# Patient Record
Sex: Male | Born: 1955
Health system: Southern US, Community
[De-identification: ages and names within clinical notes are randomized; demographics above are authoritative.]

## PROBLEM LIST (undated history)

## (undated) DIAGNOSIS — F209 Schizophrenia, unspecified: Secondary | ICD-10-CM

## (undated) HISTORY — PX: COLON SURGERY: SHX602

## (undated) MED FILL — Ferumoxytol Inj 510 MG/17ML (30 MG/ML) (Elemental Fe): INTRAVENOUS | Qty: 17 | Status: AC

---

## 2005-06-14 ENCOUNTER — Emergency Department: Payer: Self-pay | Admitting: Emergency Medicine

## 2008-09-17 ENCOUNTER — Emergency Department: Payer: Self-pay | Admitting: Emergency Medicine

## 2009-09-24 ENCOUNTER — Emergency Department: Payer: Self-pay | Admitting: Internal Medicine

## 2009-10-15 ENCOUNTER — Ambulatory Visit: Payer: Self-pay | Admitting: Family Medicine

## 2011-02-14 ENCOUNTER — Emergency Department: Payer: Self-pay | Admitting: Emergency Medicine

## 2012-06-06 ENCOUNTER — Emergency Department: Payer: Self-pay | Admitting: Emergency Medicine

## 2013-05-14 ENCOUNTER — Emergency Department: Payer: Self-pay | Admitting: Emergency Medicine

## 2013-05-14 LAB — COMPREHENSIVE METABOLIC PANEL
Albumin: 3.7 g/dL (ref 3.4–5.0)
Alkaline Phosphatase: 102 U/L
BUN: 10 mg/dL (ref 7–18)
Bilirubin,Total: 0.4 mg/dL (ref 0.2–1.0)
Calcium, Total: 9.3 mg/dL (ref 8.5–10.1)
Chloride: 111 mmol/L — ABNORMAL HIGH (ref 98–107)
EGFR (African American): >60
Osmolality: 276 (ref 275–301)
SGOT(AST): 16 U/L (ref 15–37)
SGPT (ALT): 18 U/L (ref 12–78)

## 2013-05-14 LAB — CBC WITH DIFFERENTIAL/PLATELET
Basophil %: 0.3 %
Eosinophil #: 0.1 10*3/uL (ref 0.0–0.7)
HCT: 48.2 % (ref 40.0–52.0)
HGB: 16 g/dL (ref 13.0–18.0)
Lymphocyte #: 2 10*3/uL (ref 1.0–3.6)
Lymphocyte %: 21.6 %
MCV: 83 fL (ref 80–100)
Monocyte %: 9.1 %
Neutrophil %: 68.4 %
RBC: 5.78 10*6/uL (ref 4.40–5.90)

## 2013-05-14 LAB — URINALYSIS, COMPLETE
Bacteria: NONE SEEN
Bilirubin,UR: NEGATIVE
Ketone: NEGATIVE
Leukocyte Esterase: NEGATIVE
Nitrite: NEGATIVE
Ph: 5 (ref 4.5–8.0)
RBC,UR: 543 /HPF (ref 0–5)
WBC UR: 2 /HPF (ref 0–5)

## 2014-03-18 ENCOUNTER — Ambulatory Visit: Payer: Self-pay | Admitting: Advanced Practice Midwife

## 2015-03-15 LAB — HM HIV SCREENING LAB: HM HIV Screening: NEGATIVE

## 2016-11-28 ENCOUNTER — Emergency Department
Admission: EM | Admit: 2016-11-28 | Discharge: 2016-11-30 | Disposition: A | Discharge status: Other Acute Inpatient Hospital | Payer: Medicare Other | Attending: Emergency Medicine | Admitting: Emergency Medicine

## 2016-11-28 DIAGNOSIS — F29 Unspecified psychosis not due to a substance or known physiological condition: Secondary | ICD-10-CM | POA: Insufficient documentation

## 2016-11-28 DIAGNOSIS — F17200 Nicotine dependence, unspecified, uncomplicated: Secondary | ICD-10-CM | POA: Diagnosis present

## 2016-11-28 DIAGNOSIS — R440 Auditory hallucinations: Secondary | ICD-10-CM | POA: Diagnosis present

## 2016-11-28 LAB — CBC
HEMATOCRIT: 46.5 % (ref 40.0–52.0)
Hemoglobin: 15.4 g/dL (ref 13.0–18.0)
MCH: 28 pg (ref 26.0–34.0)
MCHC: 33.2 g/dL (ref 32.0–36.0)
MCV: 84.3 fL (ref 80.0–100.0)
Platelets: 154 10*3/uL (ref 150–440)
RBC: 5.51 MIL/uL (ref 4.40–5.90)
RDW: 13.7 % (ref 11.5–14.5)
WBC: 10.1 10*3/uL (ref 3.8–10.6)

## 2016-11-28 LAB — COMPREHENSIVE METABOLIC PANEL
ALBUMIN: 3.9 g/dL (ref 3.5–5.0)
ALK PHOS: 72 U/L (ref 38–126)
ALT: 14 U/L — ABNORMAL LOW (ref 17–63)
AST: 26 U/L (ref 15–41)
Anion gap: 6 (ref 5–15)
BILIRUBIN TOTAL: 0.5 mg/dL (ref 0.3–1.2)
BUN: 12 mg/dL (ref 6–20)
CALCIUM: 9.4 mg/dL (ref 8.9–10.3)
CO2: 23 mmol/L (ref 22–32)
CREATININE: 1.4 mg/dL — AB (ref 0.61–1.24)
Chloride: 109 mmol/L (ref 101–111)
GFR calc Af Amer: >60 mL/min (ref >60)
GFR calc non Af Amer: 53 mL/min — ABNORMAL LOW (ref >60)
GLUCOSE: 153 mg/dL — AB (ref 65–99)
Potassium: 3.4 mmol/L — ABNORMAL LOW (ref 3.5–5.1)
Sodium: 138 mmol/L (ref 135–145)
TOTAL PROTEIN: 7 g/dL (ref 6.5–8.1)

## 2016-11-28 LAB — URINE DRUG SCREEN, QUALITATIVE (ARMC ONLY)
AMPHETAMINES, UR SCREEN: NOT DETECTED
BENZODIAZEPINE, UR SCRN: NOT DETECTED
Barbiturates, Ur Screen: NOT DETECTED
Cannabinoid 50 Ng, Ur ~~LOC~~: NOT DETECTED
Cocaine Metabolite,Ur ~~LOC~~: NOT DETECTED
MDMA (Ecstasy)Ur Screen: NOT DETECTED
Methadone Scn, Ur: NOT DETECTED
OPIATE, UR SCREEN: NOT DETECTED
PHENCYCLIDINE (PCP) UR S: NOT DETECTED
Tricyclic, Ur Screen: NOT DETECTED

## 2016-11-28 LAB — ACETAMINOPHEN LEVEL: Acetaminophen (Tylenol), Serum: <10 ug/mL — ABNORMAL LOW (ref 10–30)

## 2016-11-28 LAB — ETHANOL: Alcohol, Ethyl (B): <5 mg/dL (ref <5)

## 2016-11-28 LAB — SALICYLATE LEVEL: Salicylate Lvl: <7 mg/dL (ref 2.8–30.0)

## 2016-11-28 MED ORDER — OLANZAPINE 5 MG PO TABS
5.0000 mg | ORAL_TABLET | Freq: Every day | ORAL | Status: DC
  Filled 2016-11-28: qty 1

## 2016-11-28 MED ORDER — LORAZEPAM 2 MG PO TABS: 2.0000 mg | ORAL_TABLET | Freq: Four times a day (QID) | ORAL | Status: DC | PRN

## 2016-11-28 MED ORDER — LORAZEPAM 2 MG/ML IJ SOLN: 2.0000 mg | Freq: Four times a day (QID) | INTRAMUSCULAR | Status: DC | PRN

## 2016-11-28 NOTE — ED Provider Notes (Addendum)
[facility] Emergency Department Provider Note  Time seen: 8:39 PM  I have reviewed the triage vital signs Dustin the nursing notes.   HISTORY  Chief Complaint Behavior Problem    HPI Dustin [name] is a 61 y.o. male with a past medical history of schizophrenia who presents to the emergency department under an involuntary commitment taken out by family stating the patient has been not taking his [name], Dustin [name], Dustin [name] agitated Dustin yelling at neighbors. Here the patient denies any complaints but he is very difficult to comprehend, has rambling speech with a tangential thought process. Jumps from topic to topic but none of which seems to make sense. he denies any SI or HI. Admits to not taking his [name] but states they refused to give them to him.  No past medical history on file.  There are no active problems to display for this patient.   No past surgical history on file.  Prior to Admission [name]   Not on File    No Known Allergies  No family history on file.  Social History Social History  Substance Use Topics  . Smoking status: Not on file  . Smokeless tobacco: Not on file  . Alcohol use Not on file    Review of Systems Constitutional: Negative for fever. Cardiovascular: Negative for chest pain. Respiratory: Negative for shortness of breath. Gastrointestinal: Negative for abdominal pain Neurological: Negative for headache All other ROS negative  ____________________________________________   PHYSICAL EXAM:  VITAL SIGNS: ED Triage Vitals [11/28/16 1919]  Enc Vitals Group     BP (!) 157/102     Pulse Rate (!) 107     Resp 20     Temp 98.7 F (37.1 C)     Temp Source Oral     SpO2 99 %     Weight 165 lb (74.8 kg)     Height 6' (1.829 m)     Head Circumference      Peak Flow      Pain Score      Pain Loc      Pain Edu?      Excl. in [place]?     Constitutional:Alert, no distress.  Patient is very difficult to follow, speech does not follow a normal process, difficult for him to convey that he is attempting to convey. Eyes: Normal exam ENT   Head: Normocephalic Dustin atraumatic.   Mouth/Throat: Mucous membranes are moist. Cardiovascular: Normal rate, regular rhythm. No murmur Respiratory: Normal respiratory effort without tachypnea nor retractions. Breath sounds are clear  Gastrointestinal: Soft Dustin nontender. No distention.  Musculoskeletal: Nontender with normal range of motion in all extremities.  Neurologic:  Normal speech Dustin language. No gross focal neurologic deficits  Skin:  Skin is warm, dry Dustin intact.  Psychiatric: Mood Dustin affect are normal.   ____________________________________________   INITIAL IMPRESSION / ASSESSMENT Dustin PLAN / ED COURSE  Pertinent labs & imaging results that were available during my care of the patient were reviewed by me Dustin considered in my medical decision making (see chart for details).  Patient presents the emergency department for IVC not taking [name], reportedly hearing voices although the patient denies this, him being agitated yelling at neighbors. Patient does admit to not taking his [name]. In the emergency department the patient is very difficult to follow, jumps from thought to thought Dustin does not make much sense in his explanations. Is not able to fluently convey a sentence.  We will maintain the IVC into the patient can be evaluated by psychiatry. Patient is medical workup is thus far largely nonrevealing.  Patient's lab work is largely at baseline. Patient has been seen by psychiatry they recommend inpatient admission for the patient. We will start the patient on Zyprexa Dustin will order [name] as recommended by psychiatry.  ____________________________________________   FINAL CLINICAL IMPRESSION(S) / ED DIAGNOSES  Psychosis    Harvest Dark, MD 11/28/16 2042    Harvest Dark,  MD 11/28/16 2253

## 2016-11-28 NOTE — ED Notes (Signed)
Patient assigned to appropriate care area. Patient oriented to unit/care area: Informed that, for their safety, care areas are designed for safety and monitored by security cameras at all times; and visiting hours explained to patient. Patient verbalizes understanding, and verbal contract for safety obtained. 

## 2016-11-28 NOTE — BH Assessment (Signed)
Assessment Note  Dustin [name] is an 61 y.o. male presenting to the ED under IVC from [place].  According to the IVC, patient has not been taking his medications and responding to auditory hallucinations.  Pt was seen today at [facility] for his dose of Invega.  Staff from [place] (Dustin Font, RN) reports patient had been receiving Invega through [facility] but was not able to get his injections due to [place] not having anyone to administer.  Ms Dustin [name] states that patient came to [place] several times to get his injections.  She reports that pt will not be able to continue receiving his injection through [facility].  Ms. Dustin [name] also reports pt is hypereligious and has been making sexually inappropriate statements to male staff.  Pt denies these allegations.  Pt denies SI/HI.  He denies current drug/alcohol use.  Diagnosis:Schizophrenia  Past Medical History: No past medical history on file.  No past surgical history on file.  Family History: No family history on file.  Social History:  has no tobacco, alcohol, and drug history on file.  Additional Social History:  Alcohol / Drug Use Pain Medications: See PTA Prescriptions: See PTA Over the Counter: See PTA History of alcohol / drug use?: No history of alcohol / drug abuse  CIWA: CIWA-Ar BP: (!) 142/98 Pulse Rate: (!) 101 COWS:    Allergies: No Known Allergies  Home Medications:  (Not in a hospital admission)  OB/GYN Status:  No LMP for male patient.  General Assessment Data Location of Assessment: [facility] ED TTS Assessment: In system Is this a Tele or Face-to-Face Assessment?: Face-to-Face Is this an Initial Assessment or a Re-assessment for this encounter?: Initial Assessment Marital status: Single Maiden name: n/a Is patient pregnant?: No Pregnancy Status: No Living Arrangements: Other relatives Can pt return to current living arrangement?: Yes Admission Status: Involuntary Is patient capable of signing voluntary  admission?: No Referral Source: Psychiatrist Insurance type: None     Crisis Care Plan Living Arrangements: Other relatives Legal Guardian: Other: (self) Name of Psychiatrist: [place] Name of Therapist: [facility]  Education Status Is patient currently in school?: No Current Grade: n/a Highest grade of school patient has completed: n/a Name of school: n/a Contact person: n/a  Risk to self with the past 6 months Suicidal Ideation: No Has patient been a risk to self within the past 6 months prior to admission? : No Suicidal Intent: No Has patient had any suicidal intent within the past 6 months prior to admission? : No Is patient at risk for suicide?: No Suicidal Plan?: No Has patient had any suicidal plan within the past 6 months prior to admission? : No Access to Means: No What has been your use of drugs/alcohol within the last 12 months?: Pt denies drug/alcohol use Previous Attempts/Gestures: No How many times?: 0 Other Self Harm Risks: None identified Triggers for Past Attempts: None known Intentional Self Injurious Behavior: None Family Suicide History: Unknown Recent stressful life event(s): Other (Comment) Persecutory voices/beliefs?: No Depression: No Substance abuse history and/or treatment for substance abuse?: No Suicide prevention information given to non-admitted patients: Not applicable  Risk to Others within the past 6 months Homicidal Ideation: No Does patient have any lifetime risk of violence toward others beyond the six months prior to admission? : No Thoughts of Harm to Others: No Current Homicidal Intent: No Current Homicidal Plan: No Access to Homicidal Means: No Identified Victim: none identified History of harm to others?: No Assessment of Violence: None Noted Violent  Behavior Description: None identified Does patient have access to weapons?: No Criminal Charges Pending?: No Does patient have a court date: No Is patient on probation?:  No  Psychosis Hallucinations: None noted Delusions: None noted  Mental Status Report Appearance/Hygiene: In scrubs Eye Contact: Fair Motor Activity: Freedom of movement Speech: Incoherent Level of Consciousness: Alert Mood: Pleasant Affect: Inconsistent with thought content Anxiety Level: Minimal Thought Processes: Flight of Ideas Judgement: Partial Orientation: Person, Place, Time Obsessive Compulsive Thoughts/Behaviors: None  Cognitive Functioning Concentration: Good Memory: Remote Intact IQ: Average Insight: Fair Impulse Control: Fair Appetite: Good Sleep: No Change Vegetative Symptoms: None  ADLScreening [facility] Assessment Services) Patient's cognitive ability adequate to safely complete daily activities?: Yes Patient able to express need for assistance with ADLs?: Yes Independently performs ADLs?: Yes (appropriate for developmental age)  Prior Inpatient Therapy Prior Inpatient Therapy: No Prior Therapy Dates: na Prior Therapy Facilty/Provider(s): na Reason for Treatment: na  Prior Outpatient Therapy Prior Outpatient Therapy: Yes Prior Therapy Dates: current Prior Therapy Facilty/Provider(s): Dustin [name] Reason for Treatment: schizophrenia Does patient have an ACCT team?: No Does patient have Intensive In-House Services?  : No Does patient have [facility] services? : No Does patient have P4CC services?: No  ADL Screening (condition at time of admission) Patient's cognitive ability adequate to safely complete daily activities?: Yes Patient able to express need for assistance with ADLs?: Yes Independently performs ADLs?: Yes (appropriate for developmental age)       Abuse/Neglect Assessment (Assessment to be complete while patient is alone) Physical Abuse: Denies Verbal Abuse: Denies Sexual Abuse: Denies Exploitation of patient/patient's resources: Denies Self-Neglect: Denies Values / Beliefs Cultural Requests During Hospitalization: None Spiritual Requests  During Hospitalization: None Consults Spiritual Care Consult Needed: No Social Work Consult Needed: No Regulatory affairs officer (For Healthcare) Does Patient Have a Medical Advance Directive?: No    Additional Information 1:1 In Past 12 Months?: No CIRT Risk: No Elopement Risk: No Does patient have medical clearance?: Yes     Disposition:  Disposition Initial Assessment Completed for this Encounter: Yes Disposition of Patient: Inpatient treatment program Type of inpatient treatment program: Adult  On Site Evaluation by:   Reviewed with Physician:    Oneita Hurt 11/28/2016 11:17 PM

## 2016-11-28 NOTE — ED Notes (Signed)
SOC MD called to get report on the Pt.

## 2016-11-28 NOTE — ED Triage Notes (Addendum)
Pt to triage via wheelchair.  Pt is IVC.   Pt brought in by [employer] .  Pt has not been taking his meds.   Pt uncooperative in triage. Pt refusing to have blood work done.

## 2016-11-29 ENCOUNTER — Encounter: Payer: Self-pay | Admitting: Psychiatry

## 2016-11-29 DIAGNOSIS — F17200 Nicotine dependence, unspecified, uncomplicated: Secondary | ICD-10-CM | POA: Diagnosis present

## 2016-11-29 MED ORDER — NICOTINE 21 MG/24HR TD PT24: 21.0000 mg | MEDICATED_PATCH | Freq: Every day | TRANSDERMAL | Status: DC

## 2016-11-29 NOTE — ED Notes (Signed)
Pt ambulated to the bathroom and returned to his room without difficulty.

## 2016-11-29 NOTE — ED Notes (Signed)
Patient standing in doorway talking with staff

## 2016-11-29 NOTE — ED Notes (Signed)
Pt received lunch tray 

## 2016-11-29 NOTE — ED Notes (Signed)
Pt received dinner tray.

## 2016-11-29 NOTE — ED Notes (Signed)
Patient resting quietly in room. No noted distress or abnormal behaviors noted. Will continue 15 minute checks and observation by security camera for safety. 

## 2016-11-29 NOTE — ED Notes (Signed)
Report given to [place], Therapist, sports.

## 2016-11-29 NOTE — BH Assessment (Signed)
Pt placement referral submitted to [facility].  

## 2016-11-29 NOTE — BH Assessment (Addendum)
Patient has been accepted for placement with [facility] by Dr. Bary Leriche. Bed Assignment:  312 Attending MD:  Dr. Jerilee Hoh

## 2016-11-29 NOTE — ED Notes (Signed)
Pt taking a shower 

## 2016-11-29 NOTE — ED Notes (Signed)
Pt became agitated and aggressive when RN and tech tried to get his vitals.

## 2016-11-29 NOTE — ED Notes (Signed)
Pt ambulatory out into hall, this RN asks if he needs anything, pt does not speak to me, points to ED tech, Solmon Ice to speak with her.  ED tech addresses pt needs at this time.

## 2016-11-29 NOTE — ED Provider Notes (Signed)
-----------------------------------------   6:05 AM on 11/29/2016 -----------------------------------------   Blood pressure (!) 142/98, pulse (!) 101, temperature 98.7 F (37.1 C), temperature source Oral, resp. rate 18, height 6' (1.829 m), weight 74.8 kg (165 lb), SpO2 99 %.  The patient had no acute events since last update.  Calm and cooperative at this time.  Disposition is pending Psychiatry/Behavioral Medicine team recommendations.     Paulette Blanch, MD 11/29/16 [phone]

## 2016-11-29 NOTE — BH Assessment (Signed)
Pt chart under review by Dr.Pucilowska for possible [facility] BMU admission. 

## 2016-11-29 NOTE — ED Notes (Signed)
Pt laying in bed upon approach. RN introduced herself and patient replied, "Nice to meet you Amy."  Pt then asked for more water.  Pt was difficult to understand, but was smiling during interaction. Maintained on 15 minute checks and observation by security camera for safety.

## 2016-11-30 ENCOUNTER — Inpatient Hospital Stay
Admission: AD | Admit: 2016-11-30 | Discharge: 2016-12-13 | DRG: 885 | Disposition: A | Discharge status: Home / Self Care | Payer: Medicare Other | Attending: Psychiatry | Admitting: Psychiatry

## 2016-11-30 DIAGNOSIS — Z9114 Patient's other noncompliance with medication regimen: Secondary | ICD-10-CM | POA: Diagnosis not present

## 2016-11-30 DIAGNOSIS — I959 Hypotension, unspecified: Secondary | ICD-10-CM | POA: Diagnosis not present

## 2016-11-30 DIAGNOSIS — F17210 Nicotine dependence, cigarettes, uncomplicated: Secondary | ICD-10-CM | POA: Diagnosis present

## 2016-11-30 DIAGNOSIS — R000 Tachycardia, unspecified: Secondary | ICD-10-CM | POA: Diagnosis not present

## 2016-11-30 DIAGNOSIS — F203 Undifferentiated schizophrenia: Secondary | ICD-10-CM | POA: Diagnosis present

## 2016-11-30 DIAGNOSIS — G4700 Insomnia, unspecified: Secondary | ICD-10-CM | POA: Diagnosis present

## 2016-11-30 DIAGNOSIS — F419 Anxiety disorder, unspecified: Secondary | ICD-10-CM | POA: Diagnosis present

## 2016-11-30 DIAGNOSIS — K5900 Constipation, unspecified: Secondary | ICD-10-CM | POA: Diagnosis not present

## 2016-11-30 DIAGNOSIS — F17200 Nicotine dependence, unspecified, uncomplicated: Secondary | ICD-10-CM | POA: Diagnosis present

## 2016-11-30 DIAGNOSIS — I951 Orthostatic hypotension: Secondary | ICD-10-CM | POA: Diagnosis not present

## 2016-11-30 DIAGNOSIS — F329 Major depressive disorder, single episode, unspecified: Secondary | ICD-10-CM | POA: Diagnosis present

## 2016-11-30 DIAGNOSIS — F22 Delusional disorders: Secondary | ICD-10-CM | POA: Diagnosis present

## 2016-11-30 DIAGNOSIS — F39 Unspecified mood [affective] disorder: Secondary | ICD-10-CM | POA: Diagnosis not present

## 2016-11-30 MED ORDER — OLANZAPINE 5 MG PO TBDP: 15.0000 mg | ORAL_TABLET | Freq: Every day | ORAL | Status: DC

## 2016-11-30 MED ORDER — ALUM & MAG HYDROXIDE-SIMETH 200-200-20 MG/5ML PO SUSP: 30.0000 mL | ORAL | Status: DC | PRN

## 2016-11-30 MED ORDER — NICOTINE 21 MG/24HR TD PT24
21.0000 mg | MEDICATED_PATCH | Freq: Every day | TRANSDERMAL | Status: DC
  Filled 2016-11-30 (×4): qty 1

## 2016-11-30 MED ORDER — LORAZEPAM 2 MG/ML IJ SOLN: 2.0000 mg | INTRAMUSCULAR | Status: DC | PRN

## 2016-11-30 MED ORDER — LORAZEPAM 2 MG PO TABS: 2.0000 mg | ORAL_TABLET | Freq: Four times a day (QID) | ORAL | Status: DC | PRN

## 2016-11-30 MED ORDER — TEMAZEPAM 15 MG PO CAPS: 30.0000 mg | ORAL_CAPSULE | Freq: Every evening | ORAL | Status: DC | PRN

## 2016-11-30 MED ORDER — ACETAMINOPHEN 325 MG PO TABS
650.0000 mg | ORAL_TABLET | Freq: Four times a day (QID) | ORAL | Status: DC | PRN
  Administered 2016-12-03: 650 mg via ORAL
  Filled 2016-11-30: qty 2

## 2016-11-30 MED ORDER — OLANZAPINE 5 MG PO TBDP
15.0000 mg | ORAL_TABLET | Freq: Two times a day (BID) | ORAL | Status: DC
  Filled 2016-11-30: qty 3

## 2016-11-30 MED ORDER — TRAZODONE HCL 100 MG PO TABS: 100.0000 mg | ORAL_TABLET | Freq: Every day | ORAL | Status: DC

## 2016-11-30 MED ORDER — MAGNESIUM HYDROXIDE 400 MG/5ML PO SUSP
30.0000 mL | Freq: Every day | ORAL | Status: DC | PRN
  Administered 2016-12-06: 30 mL via ORAL
  Filled 2016-11-30: qty 30

## 2016-11-30 MED ORDER — LORAZEPAM 2 MG PO TABS
2.0000 mg | ORAL_TABLET | Freq: Every day | ORAL | Status: DC
  Administered 2016-12-01 – 2016-12-03 (×3): 2 mg via ORAL
  Filled 2016-11-30 (×4): qty 1

## 2016-11-30 MED ORDER — LORAZEPAM 2 MG PO TABS
2.0000 mg | ORAL_TABLET | ORAL | Status: DC | PRN
  Filled 2016-11-30: qty 1

## 2016-11-30 NOTE — Progress Notes (Signed)
[facility] Second Physician Opinion Progress Note for Medication Administration to Non-consenting Patients (For Involuntarily Committed Patients)  Patient: Dustin [name] Date of Birth: [id number] MRN: [id number]  Reason for the Medication: The patient, without the benefit of the specific treatment measure, is incapable of participating in any available treatment plan that will give the patient a realistic opportunity of improving the patient's condition. There is, without the benefit of the specific treatment measure, a significant possibility that the patient will harm self or others before improvement of the patient's condition is realized.  Consideration of Side Effects: Consideration of the side effects related to the medication plan has been given.  Rationale for Medication Administration: his condition will worsen.    Orson Slick, MD 11/30/16  11:00 AM   This documentation is good for (7) seven days from the date of the MD signature. New documentation must be completed every seven (7) days with detailed justification in the medical record if the patient requires continued non-emergent administration of psychotropic medications.
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
## 2016-11-30 NOTE — ED Notes (Signed)
Preparing patient for transfer

## 2016-11-30 NOTE — B[facility] Group Notes (Signed)
[facility] LCSW Group Therapy  11/30/2016 1:57 PM  Type of Therapy:  Group Therapy  Participation Level:  Did Not Attend  Summary of Progress/Problems: Balance in life: Patients will discuss the concept of balance and how it looks and feels to be unbalanced. Pt will identify areas in their life that is unbalanced and ways to become more balanced. They discussed what aspects in their lives has influenced their self care. Patients also discussed self care in the areas of self regulation/control, hygiene/appearance, sleep/relaxation, healthy leisure, healthy eating habits, exercise, inner peace/spirituality, self improvement, sobriety, and health management. They were challenged to identify changes that are needed in order to improve self care.  Montrail Mehrer G. [place], [place] 11/30/2016, 1:57 PM

## 2016-11-30 NOTE — Progress Notes (Signed)
Recreation Therapy Notes  Date: 06.28.18 Time: 9:30 am Location: Craft Room  Group Topic: Leisure Education  Goal Area(s) Addresses:  Patient will identify things they are grateful for. Patient will identify how being grateful can influence decision making.  Behavioral Response: Did not attend  Intervention: Grateful Wheel  Activity: Patients were given an I Am Grateful For worksheet and were instructed to write things they are grateful for under each category.  Education: LRT educated patients on leisure.   Education Outcome: Patient did not attend group.   Clinical Observations/Feedback: Patient did not attend group.  Shanequia Kendrick M, LRT/CTRS 11/30/2016 10:22 AM 

## 2016-11-30 NOTE — H&P (Signed)
Psychiatric Admission Assessment Adult  Patient Identification: Dustin [name] MRN:  [id number] Date of Evaluation:  11/30/2016 Chief Complaint: psychosis Principal Diagnosis: Undifferentiated schizophrenia ([place]) Diagnosis:   Patient Active Problem List   Diagnosis Date Noted  . Undifferentiated schizophrenia ([place]) [F20.3] 11/29/2016  . Tobacco use disorder [F17.200] 11/29/2016   History of Present Illness:   Dustin [name] is an 61 y.o. male with Schizophrenia presented to our ER under IVC from [place] on 11/28/16.  According to the IVC, patient has not been taking his medications and responding responding to voices such as demons, and getting agitated and yelling at neighbors.   Pt was seen on 6/26 at [facility] for his dose of Invega.  Staff from [facility] ([name], RN) reported patient had been receiving Invega through [facility] but was not able to get his injections due to [facility] not having anyone to administer.  [name] states that patient came to [facility] several times to get his injections.  She reports that pt will not be able to continue receiving his injection through [facility].  [name] also reports pt is hypereligious and has been making sexually inappropriate statements to male staff.    While in the ER he was agitated and aggressive. He was very disorganized.  Today he refused to speak with me he told me he was busy. He refused breakfast and told the staff that he is in a religious fasting. He refused vital signs and medications. Per nurse's "Tangentially explains why he refuses meds, reporting he is Catholic, is "inflicted," and someone at his outpatient mental health provider told him he does not need to take meds."   Substance abuse denies. Urine toxicology negative. Alcohol below the detection limit. Patient is a heavy smoker.  Associated Signs/Symptoms: Depression Symptoms:  insomnia, psychomotor agitation, anxiety, (Hypo) Manic Symptoms:   Delusions, Distractibility, Hallucinations, Impulsivity, Irritable Mood, Anxiety Symptoms:  Excessive Worry, Psychotic Symptoms:  Delusions, Hallucinations: Auditory Paranoia, PTSD Symptoms: NA Total Time spent with patient: 1 hour  Past Psychiatric History: Patient was seeing a psychiatrist at [facility] He has been treated in the past with Prolixin injectable  Is the patient at risk to self? Yes.    Has the patient been a risk to self in the past 6 months? No.  Has the patient been a risk to self within the distant past? No.  Is the patient a risk to others? No.  Has the patient been a risk to others in the past 6 months? No.  Has the patient been a risk to others within the distant past? No.    Alcohol Screening: 1. How often do you have a drink containing alcohol?: Monthly or less 2. How many drinks containing alcohol do you have on a typical day when you are drinking?: 1 or 2 3. How often do you have six or more drinks on one occasion?: Never Preliminary Score: 0 4. How often during the last year have you found that you were not able to stop drinking once you had started?: Never 5. How often during the last year have you failed to do what was normally expected from you becasue of drinking?: Never 6. How often during the last year have you needed a first drink in the morning to get yourself going after a heavy drinking session?: Never 7. How often during the last year have you had a feeling of guilt of remorse after drinking?: Never 8. How often during the last year have you  been unable to remember what happened the night before because you had been drinking?: Never 9. Have you or someone else been injured as a result of your drinking?: No 10. Has a relative or friend or a doctor or another health worker been concerned about your drinking or suggested you cut down?: No Alcohol Use Disorder Identification Test Final Score (AUDIT): 1 Brief Intervention: AUDIT score less  than 7 or less-screening does not suggest unhealthy drinking-brief intervention not indicated  Past Medical History: History reviewed. No pertinent past medical history. History reviewed. No pertinent surgical history.   Family History: History reviewed. No pertinent family history.   Family Psychiatric  History: Not known  Tobacco Screening: Have you used any form of tobacco in the last 30 days? (Cigarettes, Smokeless Tobacco, Cigars, and/or Pipes): Yes Tobacco use, Select all that apply: 5 or more cigarettes per day Are you interested in Tobacco Cessation Medications?: No, patient refused Counseled patient on smoking cessation including recognizing danger situations, developing coping skills and basic information about quitting provided: Refused/Declined practical counseling (Refused Nicotine Patch as recommended)   Social History:  History  Alcohol use Not on file     History  Drug use: Unknown     Allergies:  No Known Allergies   Lab Results:  Results for orders placed or performed during the hospital encounter of 11/28/16 (from the past 48 hour(s))  Comprehensive metabolic panel     Status: Abnormal   Collection Time: 11/28/16  7:19 PM  Result Value Ref Range   Sodium 138 135 - 145 mmol/L   Potassium 3.4 (L) 3.5 - 5.1 mmol/L   Chloride 109 101 - 111 mmol/L   CO2 23 22 - 32 mmol/L   Glucose, Bld 153 (H) 65 - 99 mg/dL   BUN 12 6 - 20 mg/dL   Creatinine, Ser 1.40 (H) 0.61 - 1.24 mg/dL   Calcium 9.4 8.9 - 10.3 mg/dL   Total Protein 7.0 6.5 - 8.1 g/dL   Albumin 3.9 3.5 - 5.0 g/dL   AST 26 15 - 41 U/L   ALT 14 (L) 17 - 63 U/L   Alkaline Phosphatase 72 38 - 126 U/L   Total Bilirubin 0.5 0.3 - 1.2 mg/dL   GFR calc non Af Amer 53 (L) >60 mL/min   GFR calc Af Amer >60 >60 mL/min    Comment: (NOTE) The eGFR has been calculated using the CKD EPI equation. This calculation has not been validated in all clinical situations. eGFR's persistently <60 mL/min signify possible Chronic  Kidney Disease.    Anion gap 6 5 - 15  Ethanol     Status: None   Collection Time: 11/28/16  7:19 PM  Result Value Ref Range   Alcohol, Ethyl (B) <5 <5 mg/dL    Comment:        LOWEST DETECTABLE LIMIT FOR SERUM ALCOHOL IS 5 mg/dL FOR MEDICAL PURPOSES ONLY   Salicylate level     Status: None   Collection Time: 11/28/16  7:19 PM  Result Value Ref Range   Salicylate Lvl <5.5 2.8 - 30.0 mg/dL  Acetaminophen level     Status: Abnormal   Collection Time: 11/28/16  7:19 PM  Result Value Ref Range   Acetaminophen (Tylenol), Serum <10 (L) 10 - 30 ug/mL    Comment:        THERAPEUTIC CONCENTRATIONS VARY SIGNIFICANTLY. A RANGE OF 10-30 ug/mL MAY BE AN EFFECTIVE CONCENTRATION FOR MANY PATIENTS. HOWEVER, SOME ARE BEST TREATED AT CONCENTRATIONS OUTSIDE  THIS RANGE. ACETAMINOPHEN CONCENTRATIONS >150 ug/mL AT 4 HOURS AFTER INGESTION AND >50 ug/mL AT 12 HOURS AFTER INGESTION ARE OFTEN ASSOCIATED WITH TOXIC REACTIONS.   cbc     Status: None   Collection Time: 11/28/16  7:19 PM  Result Value Ref Range   WBC 10.1 3.8 - 10.6 K/uL   RBC 5.51 4.40 - 5.90 MIL/uL   Hemoglobin 15.4 13.0 - 18.0 g/dL   HCT 46.5 40.0 - 52.0 %   MCV 84.3 80.0 - 100.0 fL   MCH 28.0 26.0 - 34.0 pg   MCHC 33.2 32.0 - 36.0 g/dL   RDW 13.7 11.5 - 14.5 %   Platelets 154 150 - 440 K/uL  Urine Drug Screen, Qualitative     Status: None   Collection Time: 11/28/16  7:19 PM  Result Value Ref Range   Tricyclic, Ur Screen NONE DETECTED NONE DETECTED   Amphetamines, Ur Screen NONE DETECTED NONE DETECTED   MDMA (Ecstasy)Ur Screen NONE DETECTED NONE DETECTED   Cocaine Metabolite,Ur [place] NONE DETECTED NONE DETECTED   Opiate, Ur Screen NONE DETECTED NONE DETECTED   Phencyclidine (PCP) Ur S NONE DETECTED NONE DETECTED   Cannabinoid 50 Ng, Ur [place] NONE DETECTED NONE DETECTED   Barbiturates, Ur Screen NONE DETECTED NONE DETECTED   Benzodiazepine, Ur Scrn NONE DETECTED NONE DETECTED   Methadone Scn, Ur NONE DETECTED NONE DETECTED     Comment: (NOTE) 831  Tricyclics, urine               Cutoff 1000 ng/mL 200  Amphetamines, urine             Cutoff 1000 ng/mL 300  MDMA (Ecstasy), urine           Cutoff 500 ng/mL 400  Cocaine Metabolite, urine       Cutoff 300 ng/mL 500  Opiate, urine                   Cutoff 300 ng/mL 600  Phencyclidine (PCP), urine      Cutoff 25 ng/mL 700  Cannabinoid, urine              Cutoff 50 ng/mL 800  Barbiturates, urine             Cutoff 200 ng/mL 900  Benzodiazepine, urine           Cutoff 200 ng/mL 1000 Methadone, urine                Cutoff 300 ng/mL 1100 1200 The urine drug screen provides only a preliminary, unconfirmed 1300 analytical test result and should not be used for non-medical 1400 purposes. Clinical consideration and professional judgment should 1500 be applied to any positive drug screen result due to possible 1600 interfering substances. A more specific alternate chemical method 1700 must be used in order to obtain a confirmed analytical result.  1800 Gas chromato graphy / mass spectrometry (GC/MS) is the preferred 1900 confirmatory method.     Blood Alcohol level:  Lab Results  Component Value Date   ETH <5 51/76/1607    Metabolic Disorder Labs:  No results found for: HGBA1C, MPG No results found for: PROLACTIN No results found for: CHOL, TRIG, HDL, CHOLHDL, VLDL, LDLCALC  Current Medications: Current Facility-Administered Medications  Medication Dose Route Frequency Provider Last Rate Last Dose  . acetaminophen (TYLENOL) tablet 650 mg  650 mg Oral Q6H PRN Pucilowska, Jolanta B, MD      . alum & mag hydroxide-simeth (MAALOX/MYLANTA) 200-200-20 MG/5ML  suspension 30 mL  30 mL Oral Q4H PRN Pucilowska, Jolanta B, MD      . LORazepam (ATIVAN) tablet 2 mg  2 mg Oral Q6H PRN Pucilowska, Jolanta B, MD      . magnesium hydroxide (MILK OF MAGNESIA) suspension 30 mL  30 mL Oral Daily PRN Pucilowska, Jolanta B, MD      . nicotine (NICODERM CQ - dosed in mg/24 hours)  patch 21 mg  21 mg Transdermal Daily Hernandez-Gonzalez, Seth Bake, MD      . OLANZapine zydis (ZYPREXA) disintegrating tablet 15 mg  15 mg Oral QHS Pucilowska, Jolanta B, MD      . temazepam (RESTORIL) capsule 30 mg  30 mg Oral QHS PRN Pucilowska, Jolanta B, MD      . traZODone (DESYREL) tablet 100 mg  100 mg Oral QHS Pucilowska, Jolanta B, MD       PTA Medications: Prescriptions Prior to Admission  Medication Sig Dispense Refill Last Dose  . acetaminophen (TYLENOL) 500 MG tablet Take by mouth.     . fluPHENAZine (PROLIXIN) 2.5 MG/ML injection Inject into the muscle.     . Multiple Vitamin (MULTIVITAMIN) capsule Take by mouth.       Musculoskeletal: Strength & Muscle Tone: within normal limits Gait & Station: normal Patient leans: N/A  Psychiatric Specialty Exam: Physical Exam  Constitutional: He is oriented to person, place, and time. He appears well-developed and well-nourished.  HENT:  Head: Normocephalic and atraumatic.  Eyes: Conjunctivae and EOM are normal.  Neck: Normal range of motion.  Respiratory: Effort normal.  Musculoskeletal: Normal range of motion.  Neurological: He is alert and oriented to person, place, and time.    Review of Systems  Constitutional: Negative.   HENT: Negative.   Eyes: Negative.   Respiratory: Negative.   Cardiovascular: Negative.   Gastrointestinal: Negative.   Genitourinary: Negative.   Musculoskeletal: Negative.   Skin: Negative.   Neurological: Negative.   Endo/Heme/Allergies: Negative.     Blood pressure (!) 149/105, pulse 95, temperature 97.8 F (36.6 C), temperature source Oral, resp. rate 18, height 6' 2"  (1.88 m), weight 70.3 kg (155 lb), SpO2 100 %.Body mass index is 19.9 kg/m.  General Appearance: Fairly Groomed  Eye Contact:  Fair  Speech:  Garbled  Volume:  Normal  Mood:  Irritable  Affect:  Constricted  Thought Process:  Irrelevant and Descriptions of Associations: Loose  Orientation:  Full (Time, Place, and Person)   Thought Content:  Delusions  Suicidal Thoughts:  No  Homicidal Thoughts:  No  Memory:  Immediate;   Poor Recent;   Poor Remote;   Poor  Judgement:  Impaired  Insight:  Lacking  Psychomotor Activity:  Increased  Concentration:  Concentration: Poor and Attention Span: Poor  Recall:  Poor  Fund of Knowledge:  Poor  Language:  Poor  Akathisia:  No  Handed:    AIMS (if indicated):     Assets:  Communication Skills  ADL's:  Intact  Cognition:  Impaired,  Mild  Sleep:  Number of Hours: 3    Treatment Plan Summary:  A 61 year old male with schizophrenia. Apparently the patient has encountered greatest difficulties in order to receive his long acting injectable medication. Per [facility] collateral information the patient has not been receiving the injection at [facility] because they didn't have a nurse. Apparently [facility] is unable to see him due to his insurance.  Schizophrenia currently on olanzapine will increase to 15 mg twice a day  Tobacco use disorder however nicotine  patch 21 mg  Agitation patient has orders for Ativan 2 mg every 4 hours when necessary PO or IM  Insomnia patient has orders for Ativan 2 mg by mouth daily at bedtime  Vital signs daily  Diet regular  Precautions every 15 minute checks  Hospitalization status involuntary commitment  Labs we will order hemoglobin A1c, lipid panel and TSH.  Collateral information is needed from [place] and patient's family.  Physician Treatment Plan for Primary Diagnosis: Undifferentiated schizophrenia ([place]) Long Term Goal(s): Improvement in symptoms so as ready for discharge  Short Term Goals: Ability to demonstrate self-control will improve and Compliance with prescribed medications will improve  Physician Treatment Plan for Secondary Diagnosis: Principal Problem:   Undifferentiated schizophrenia ([place]) Active Problems:   Tobacco use disorder  Long Term Goal(s): Improvement in symptoms so as ready for discharge  Short Term  Goals: Ability to identify changes in lifestyle to reduce recurrence of condition will improve and Ability to identify and develop effective coping behaviors will improve  I certify that inpatient services furnished can reasonably be expected to improve the patient's condition.    Hildred Priest, MD 6/28/20189:39 AM

## 2016-11-30 NOTE — BHH Counselor (Signed)
CSW attempted to complete psychosocial assessment with patient. Patient currently experiencing symptoms such as hyperreligiosity, paranoia and irritability. Patient too acute to be assessed at this time.  CSW will attempt psychosocial assessment at a later time.  Glorious Peach, MSW, LCSW-A 11/30/2016, 10:51AM

## 2016-11-30 NOTE — Tx Team (Signed)
Initial Treatment Plan 11/30/2016 3:02 AM Shonta Phillis Renzulli [id number]    PATIENT STRESSORS: Educational concerns Medication change or noncompliance   PATIENT STRENGTHS: Ability for insight Active sense of humor Capable of independent living Motivation for treatment/growth Religious Affiliation Supportive family/friends   PATIENT IDENTIFIED PROBLEMS: Mood Instability  Treatments and Medications NonCompliance  Hyper Religiosity                 DISCHARGE CRITERIA:  Improved stabilization in mood, thinking, and/or behavior Motivation to continue treatment in a less acute level of care Verbal commitment to aftercare and medication compliance  PRELIMINARY DISCHARGE PLAN: Outpatient therapy Return to previous living arrangement  PATIENT/FAMILY INVOLVEMENT: This treatment plan has been presented to and reviewed with the patient, Coren Sagan Lora.  The patient family have been given the opportunity to ask questions and make suggestions.  Electa Sniff, RN 11/30/2016, 3:02 AM

## 2016-11-30 NOTE — BHH Suicide Risk Assessment (Signed)
[facility] Admission Suicide Risk Assessment   Nursing information obtained from:    Demographic factors:    Current Mental Status:    Loss Factors:    Historical Factors:    Risk Reduction Factors:      Principal Problem: Undifferentiated schizophrenia ([place]) Diagnosis:   Patient Active Problem List   Diagnosis Date Noted  . Undifferentiated schizophrenia ([place]) [F20.3] 11/29/2016  . Tobacco use disorder [F17.200] 11/29/2016   Subjective Data:   Continued Clinical Symptoms:  Alcohol Use Disorder Identification Test Final Score (AUDIT): 1 The "Alcohol Use Disorders Identification Test", Guidelines for Use in Primary Care, Second Edition.  World Pharmacologist [employer]). Score between 0-7:  no or low risk or alcohol related problems. Score between 8-15:  moderate risk of alcohol related problems. Score between 16-19:  high risk of alcohol related problems. Score 20 or above:  warrants further diagnostic evaluation for alcohol dependence and treatment.   CLINICAL FACTORS:   Severe Anxiety and/or Agitation Schizophrenia:   Paranoid or undifferentiated type Currently Psychotic    Psychiatric Specialty Exam: Physical Exam  ROS  Blood pressure (!) 149/105, pulse 95, temperature 97.8 F (36.6 C), temperature source Oral, resp. rate 18, height 6\' 2"  (1.88 m), weight 70.3 kg (155 lb), SpO2 100 %.Body mass index is 19.9 kg/m.                                                    Sleep:  Number of Hours: 3      COGNITIVE FEATURES THAT CONTRIBUTE TO RISK:  Loss of executive function    SUICIDE RISK:   Moderate:  Frequent suicidal ideation with limited intensity, and duration, some specificity in terms of plans, no associated intent, good self-control, limited dysphoria/symptomatology, some risk factors present, and identifiable protective factors, including available and accessible social support.  PLAN OF CARE: admit to [facility]  I certify that inpatient services  furnished can reasonably be expected to improve the patient's condition.   Hildred Priest, MD 11/30/2016, 8:31 AM

## 2016-11-30 NOTE — Plan of Care (Signed)
Problem: Health Behavior/Discharge Planning: Goal: Compliance with prescribed medication regimen will improve Outcome: Not Progressing Not compliant with medication despite encouragement and education

## 2016-11-30 NOTE — BHH Group Notes (Signed)
[place] LCSW Group Therapy Note  Type of Therapy and Topic:  Group Therapy:  Goals Group: SMART Goals  Participation Level:  Patient did not attend group. CSW invited patient to group.   Description of Group:   The purpose of a daily goals group is to assist and guide patients in setting recovery/wellness-related goals.  The objective is to set goals as they relate to the crisis in which they were admitted. Patients will be using SMART goal modalities to set measurable goals.  Characteristics of realistic goals will be discussed and patients will be assisted in setting and processing how one will reach their goal. Facilitator will also assist patients in applying interventions and coping skills learned in psycho-education groups to the SMART goal and process how one will achieve defined goal.  Therapeutic Goals: -Patients will develop and document one goal related to or their crisis in which brought them into treatment. -Patients will be guided by LCSW using SMART goal setting modality in how to set a measurable, attainable, realistic and time sensitive goal.  -Patients will process barriers in reaching goal. -Patients will process interventions in how to overcome and successful in reaching goal.   Summary of Patient Progress:  Patient Goal: None identified at this time, patient did not attend group on this date.    Therapeutic Modalities:   Motivational Interviewing Public relations account executive Therapy Crisis Intervention Model SMART goals setting  Nefertiti Mohamad G. [place], [facility] 11/30/2016 10:34 AM

## 2016-11-30 NOTE — Progress Notes (Signed)
Pt isolative to room this morning.  Upon assessment, pt reports he is not attending breakfast because he is fasting due to religious reasons.  Reports he last ate yesterday but says he intends to drink water today.  Later accepted food and fluids at lunch.  Refuses vital signs.  Refuses AM meds.  Tangentially explains why he refuses meds, reporting he is Catholic, is "inflicted," and someone at his outpatient mental health provider told him he does not need to take meds.  Pt mumbles and is difficult to understand, statements are illogical.   In afternoon, RN again offers scheduled meds.  Pt states he does not take meds because he is "pro life and the oil in those medicines go against that."  Rambles on illogically.  Generally pleasant, no behavioral issues.

## 2016-12-01 MED ORDER — FLUPHENAZINE HCL 5 MG PO TABS
5.0000 mg | ORAL_TABLET | Freq: Two times a day (BID) | ORAL | Status: DC
  Administered 2016-12-01 – 2016-12-05 (×7): 5 mg via ORAL
  Filled 2016-12-01 (×7): qty 1

## 2016-12-01 MED ORDER — FLUPHENAZINE HCL 2.5 MG/ML IJ SOLN
5.0000 mg | Freq: Two times a day (BID) | INTRAMUSCULAR | Status: DC
  Administered 2016-12-01 – 2016-12-02 (×2): 5 mg via INTRAMUSCULAR
  Filled 2016-12-01 (×8): qty 2

## 2016-12-01 NOTE — Plan of Care (Signed)
Problem: Education: Goal: Knowledge of the prescribed therapeutic regimen will improve Outcome: Not Progressing No knowledge about medications/treatment plan

## 2016-12-01 NOTE — Progress Notes (Signed)
Patient was seen in the milieu. Displaying bizarre behavior, walking back and forth, grimacing. Patient unaware of reality base. Refused his bedtime medication "I have no money for it...take, take. Get out of my room, I need to sleep" Currently sleeping and staff continue to monitor.

## 2016-12-01 NOTE — Progress Notes (Signed)
[facility] MD Progress Note  12/01/2016 12:49 PM JABEZ MOLNER  MRN:  [id number] Subjective:    Per Dr. Jerilee Hoh:  Mr. Shyne Lehrke is a 61 yo african-american male who presented to the ER under IVC from [place] on 11/28/16.  According to the IVC, the patient had not been taking medications and that the patient was stated he could hear demon voices, and was agitated and yelling at neighbors.  Pt was seen at [facility] for his does of [place], and staff from [place] reported that the patient had been getting it from [facility], but was not able to get his last injection bc nobody was there to administer it.     [name] also reports pt is hypereligious and has been making sexually inappropriate statements to male staff.   While in the ER he was agitated and aggressive. He was very disorganized.  6/28 Today he refused to speak with me he told me he was busy. He refused breakfast and told the staff that he is in areligious fasting. He refused vital signs and medications. Per nurse's "Tangentially explains why he refuses meds, reporting he is Catholic, is "inflicted," and someone at his outpatient mental health provider told him he does not need to take meds."   6/29 patient has refused medications multiple times. Nonemergency medications have been ordered. The patient required a manual hold this morning in order to administer Prolixin.  During assessment today he was very disorganized, hyperreligious but he was more cooperative and less argumentative than yesterday  Per nursing: Patient was seen in the milieu. Displaying bizarre behavior, walking back and forth, grimacing. Patient unaware of reality base. Refused his bedtime medication "I have no money for it...take, take. Get out of my room, I need to sleep" Currently sleeping and staff continue to monitor.   Principal Problem: Undifferentiated schizophrenia ([place]) Diagnosis:   Patient Active Problem List   Diagnosis Date Noted  . Undifferentiated schizophrenia  ([place]) [F20.3] 11/29/2016  . Tobacco use disorder [F17.200] 11/29/2016   Total Time spent with patient: 30 minutes   Past Medical History: History reviewed. No pertinent past medical history. History reviewed. No pertinent surgical history. Family History: History reviewed. No pertinent family history. Family Psychiatric  History: unknown  Social History:  History  Alcohol use Not on file     History  Drug use: Unknown    Social History   Social History  . Marital status: Single    Spouse name: N/A  . Number of children: N/A  . Years of education: N/A   Social History Main Topics  . Smoking status: Current Every Day Smoker  . Smokeless tobacco: Never Used  . Alcohol use None  . Drug use: Unknown  . Sexual activity: Not Asked   Other Topics Concern  . None   Social History Narrative  . None   Additional Social History:                          Current Medications: Current Facility-Administered Medications  Medication Dose Route Frequency Provider Last Rate Last Dose  . acetaminophen (TYLENOL) tablet 650 mg  650 mg Oral Q6H PRN Pucilowska, Jolanta B, MD      . alum & mag hydroxide-simeth (MAALOX/MYLANTA) 200-200-20 MG/5ML suspension 30 mL  30 mL Oral Q4H PRN Pucilowska, Jolanta B, MD      . fluPHENAZine (PROLIXIN) injection 5 mg  5 mg Intramuscular BID Hildred Priest, MD   5 mg at  12/01/16 1014   Or  . fluPHENAZine (PROLIXIN) tablet 5 mg  5 mg Oral BID Hildred Priest, MD      . LORazepam (ATIVAN) tablet 2 mg  2 mg Oral Q4H PRN Hildred Priest, MD       Or  . LORazepam (ATIVAN) injection 2 mg  2 mg Intramuscular Q4H PRN Hildred Priest, MD      . LORazepam (ATIVAN) tablet 2 mg  2 mg Oral QHS Hernandez-Gonzalez, Seth Bake, MD      . magnesium hydroxide (MILK OF MAGNESIA) suspension 30 mL  30 mL Oral Daily PRN Pucilowska, Jolanta B, MD      . nicotine (NICODERM CQ - dosed in mg/24 hours) patch 21 mg  21 mg  Transdermal Daily Hildred Priest, MD        Lab Results: No results found for this or any previous visit (from the past 48 hour(s)).  Blood Alcohol level:  Lab Results  Component Value Date   ETH <5 66/44/0347    Metabolic Disorder Labs: No results found for: HGBA1C, MPG No results found for: PROLACTIN No results found for: CHOL, TRIG, HDL, CHOLHDL, VLDL, LDLCALC  Physical Findings: AIMS:  , ,  ,  ,    CIWA:    COWS:     Musculoskeletal: Strength & Muscle Tone: within normal limits Gait & Station: normal Patient leans: N/A  Psychiatric Specialty Exam: Physical Exam  Constitutional: He is oriented to person, place, and time. He is active and cooperative.  Respiratory: Effort normal and breath sounds normal.  Neurological: He is alert and oriented to person, place, and time.    Review of Systems  Constitutional: Negative for chills and fever.  Respiratory: Negative for cough and shortness of breath.   Cardiovascular: Negative for chest pain and palpitations.  Neurological: Negative for dizziness, loss of consciousness and headaches.  Psychiatric/Behavioral: Negative for depression, hallucinations and suicidal ideas.    Blood pressure (!) 149/105, pulse 95, temperature 97.8 F (36.6 C), temperature source Oral, resp. rate 18, height 6\' 2"  (1.88 m), weight 70.3 kg (155 lb), SpO2 100 %.Body mass index is 19.9 kg/m.  General Appearance: Disheveled  Eye Contact:  Minimal  Speech:  Garbled  Volume:  Normal  Mood:  Dysphoric  Affect:  Appropriate  Thought Process:  Disorganized and Descriptions of Associations: Loose  Orientation:  Full (Time, Place, and Person)  Thought Content:  Delusions  Suicidal Thoughts:  No  Homicidal Thoughts:  No  Memory:  Immediate;   Poor Recent;   Poor Remote;   Poor  Judgement:  Impaired  Insight:  Lacking  Psychomotor Activity:  Decreased  Concentration:  Concentration: Poor and Attention Span: Poor  Recall:  Poor  Fund of  Knowledge:  Poor  Language:  Poor  Akathisia:  No  Handed:   AIMS (if indicated):     Assets:  Armed forces logistics/support/administrative officer Physical Health  ADL's:  Intact  Cognition:  Impaired,  Mild  Sleep:  Number of Hours: 4.5     Treatment Plan Summary:  Pt is a 61 yo male with diagnosis of schizophrenia.  Pt is refusing to take medications and an order for nonemergency forced medications with prolixin IM was placed.    1. Schizoprenia:  Confirmed with [facility] that pt was on prolixin deconate, and started pt on prolixin 5 mg bid.  As mentioned above, orders were placed for nonemergency forced medications.  The first dose of prolixin was given at 10:15 AM today. Manual hold occurred at  10:13  2. Agitation: orders for ativan 2 mg q4hr prn po or im  3. Insomnia: ativan 2 mg po qPM  4. Tobacco use disorder: nicotine patch  Diet: regular, discussed soft diet because patient does not have dentures, but he declined  Check vital signs daily and precautions every 16 mins  Labs: CBC, Hgb AIC, lipid panel and TSH, patient is refusing labs  Disposition: will reassess for discharge [place], [place] 12/01/2016, 12:49 PM

## 2016-12-01 NOTE — Progress Notes (Signed)
Recreation Therapy Notes  According to nursing notes, patient was delusional and his conversations were not coherent. LRT will attempt assessment at a later time.  Leonette Monarch, LRT/CTRS 12/01/2016 3:23 PM

## 2016-12-01 NOTE — Plan of Care (Signed)
Problem: Physical Regulation: Goal: Ability to maintain clinical measurements within normal limits will improve Outcome: Not Progressing No improvement: pt not compliant with treatment

## 2016-12-01 NOTE — Plan of Care (Signed)
Problem: Health Behavior/Discharge Planning: Goal: Ability to manage health-related needs will improve Outcome: Not Progressing Refusing medications and other interventions

## 2016-12-01 NOTE — Plan of Care (Signed)
Problem: Education: Goal: Will be free of psychotic symptoms Outcome: Not Progressing Preoccupied, paranoid

## 2016-12-01 NOTE — Plan of Care (Signed)
Problem: Safety: Goal: Periods of time without injury will increase Outcome: Progressing Patient remains safe and without injury during hospitalization and on Q 15 monitoring.

## 2016-12-01 NOTE — BHH Counselor (Signed)
Adult Comprehensive Assessment  Patient ID: Dustin [name], male   DOB: 1955-10-08, 61 y.o.   MRN: [id number]  Information Source: Information source: Patient  Current Stressors:  Educational / Learning stressors: No stressors identified  Employment / Job issues: No stressors identified  Family Relationships: No stressors identified  Museum/gallery curator / Lack of resources (include bankruptcy): No stressors identified  Housing / Lack of housing: No stressors identified  Physical health (include injuries & life threatening diseases): No stressors identified  Social relationships: No stressors identified  Substance abuse: No stressors identified  Bereavement / Loss: No stressors identified   Living/Environment/Situation:  Living Arrangements: Other relatives Living conditions (as described by patient or guardian): They are fine  How long has patient lived in current situation?: Since 1994 What is atmosphere in current home: Comfortable, Supportive  Family History:  Marital status: Single Does patient have children?: No  Childhood History:  By whom was/is the patient raised?: Mother Description of patient's relationship with caregiver when they were a child: It was alright  Patient's description of current relationship with people who raised him/her: Mother deceased  How were you disciplined when you got in trouble as a child/adolescent?: Whoopings Does patient have siblings?: Yes Number of Siblings: 5 Description of patient's current relationship with siblings: 2 sisters, 3 brothers - do not see them often Did patient suffer any verbal/emotional/physical/sexual abuse as a child?: No Did patient suffer from severe childhood neglect?: No Has patient ever been sexually abused/assaulted/raped as an adolescent or adult?: No Was the patient ever a victim of a crime or a disaster?: No Witnessed domestic violence?: No Has patient been effected by domestic violence as an adult?: No  Education:   Highest grade of school patient has completed: 9th grade  Currently a student?: No Name of school: n/a Learning disability?: No  Employment/Work Situation:   Employment situation: On disability Why is patient on disability: Mental health  How long has patient been on disability: Since 1993 What is the longest time patient has a held a job?: "It varies" Where was the patient employed at that time?: Unknown  Has patient ever been in the [employer]?: No Has patient ever served in combat?: No Did You Receive Any Psychiatric Treatment/Services While in the [employer]?: No Are There Guns or Other Weapons in [place]?: No Are These Psychologist, educational?:  (N/A)  Financial Resources:   Financial resources: Teacher, early years/pre Does patient have a Programmer, applications or guardian?: No  Alcohol/Substance Abuse:   What has been your use of drugs/alcohol within the last 12 months?: Pt denies drug/alcohol use  If attempted suicide, did drugs/alcohol play a role in this?: No Alcohol/Substance Abuse Treatment Hx: Denies past history Has alcohol/substance abuse ever caused legal problems?: No  Social Support System:   Pensions consultant Support System: Fair Astronomer System: Patient stated that his brother helps him alot - and he maintain on his own Type of faith/religion: Christianity  How does patient's faith help to cope with current illness?: Read the bible, talk to pastors  Leisure/Recreation:   Leisure and Hobbies: None identified   Strengths/Needs:   What things does the patient do well?: "I am alright" In what areas does patient struggle / problems for patient: " I do not struggle with anything"  Discharge Plan:   Does patient have access to transportation?: Yes Will patient be returning to same living situation after discharge?: Yes Currently receiving community mental health services: Yes (From Whom) [employer]) Does patient  have financial  barriers related to discharge medications?: No  Summary/Recommendations:   Summary and Recommendations (to be completed by the evaluator): Patient presented to the hospital under IVC and was admitted for bizarre behavior and noncompliance.  Pt's primary diagnosis is schizophrenia.  Pt reports primary triggers for admission were noncompliance and responding to voices such as demons.  Pt reports there are no current stressors at this time.  Pt now denies SI/HI/AVH.  Patient lives in [place], [place].  Pt lists supports in the community as his two brothers.  Patient will benefit from crisis stabilization, medication evaluation, group therapy, and psycho education in addition to case management for discharge planning. Patient and CSW reviewed pt's identified goals and treatment plan. Pt verbalized understanding and agreed to treatment plan.  At discharge it is recommended that patient remain compliant with established plan and continue treatment.  Emilie Rutter, MSW, LCSW-A  12/01/2016

## 2016-12-01 NOTE — Plan of Care (Signed)
Problem: Safety: Goal: Ability to remain free from injury will improve Outcome: Progressing Fall precautions maintained

## 2016-12-01 NOTE — BHH Group Notes (Signed)
[place] LCSW Group Therapy  12/01/2016 1:54 PM  Type of Therapy:  Group Therapy  Participation Level:  Did Not Attend  Summary of Progress/Problems: Feelings around Relapse. Group members discussed the meaning of relapse and shared personal stories of relapse, how it affected them and others, and how they perceived themselves during this time. Group members were encouraged to identify triggers, warning signs and coping skills used when facing the possibility of relapse. Social supports were discussed and explored in detail. Patients also discussed facing disappointment and how that can trigger someone to relapse.  Ronak Duquette G. Ensley, [place] 12/01/2016, 1:54 PM

## 2016-12-01 NOTE — Progress Notes (Signed)
Patient took all his medication for 1700 and 2200 per Dr. Jerilee Hoh to maintain medication compliance.

## 2016-12-01 NOTE — Progress Notes (Signed)
Recreation Therapy Notes  Date: 06.29.18 Time: 9:30 am Location: Craft Room  Group Topic: Coping Skills  Goal Area(s) Addresses:  Patient will verbalize one emotion experienced in group. Patient will verbalize benefit of using art as a coping skill.  Behavioral Response: Did not attend  Intervention: Coloring  Activity: Patients were given coloring sheets to color while listening to music. Patients were instructed to think about the emotions they were feeling and what their minds were focused on.  Education: LRT educated patients on healthy coping skills.  Education Outcome: Patient did not attend group.   Clinical Observations/Feedback: Patient did not attend group.  Leonette Monarch, LRT/CTRS 12/01/2016 10:18 AM

## 2016-12-01 NOTE — BHH Suicide Risk Assessment (Signed)
[facility] INPATIENT:  Family/Significant Other Suicide Prevention Education  Suicide Prevention Education:  Education Completed; brother, Lancelot Alyea ph#: [phone] has been identified by the patient as the family member/significant other with whom the patient will be residing, and identified as the person(s) who will aid the patient in the event of a mental health crisis (suicidal ideations/suicide attempt).  With written consent from the patient, the family member/significant other has been provided the following suicide prevention education, prior to the and/or following the discharge of the patient.  The suicide prevention education provided includes the following:  Suicide risk factors  Suicide prevention and interventions  National Suicide Hotline telephone number  [facility] assessment telephone number  [facility] Emergency Assistance [place] and/or Residential Mobile Crisis Unit telephone number  Request made of family/significant other to:  Remove weapons (e.g., guns, rifles, knives), all items previously/currently identified as safety concern.    Remove drugs/medications (over-the-counter, prescriptions, illicit drugs), all items previously/currently identified as a safety concern.  The family member/significant other verbalizes understanding of the suicide prevention education information provided.  The family member/significant other agrees to remove the items of safety concern listed above.  Emilie Rutter, MSW, LCSW-A 12/01/2016, 2:44 PM

## 2016-12-01 NOTE — Tx Team (Signed)
Interdisciplinary Treatment and Diagnostic Plan Update  12/01/2016 Time of Session: 11:00 AM Dustin [name] MRN: [id number]  Principal Diagnosis: Undifferentiated schizophrenia [facility])  Secondary Diagnoses: Principal Problem:   Undifferentiated schizophrenia ([place]) Active Problems:   Tobacco use disorder   Current Medications:  Current Facility-Administered Medications  Medication Dose Route Frequency Provider Last Rate Last Dose  . acetaminophen (TYLENOL) tablet 650 mg  650 mg Oral Q6H PRN Pucilowska, Jolanta B, MD      . alum & mag hydroxide-simeth (MAALOX/MYLANTA) 200-200-20 MG/5ML suspension 30 mL  30 mL Oral Q4H PRN Pucilowska, Jolanta B, MD      . fluPHENAZine (PROLIXIN) injection 5 mg  5 mg Intramuscular BID Hildred Priest, MD   5 mg at 12/01/16 1014   Or  . fluPHENAZine (PROLIXIN) tablet 5 mg  5 mg Oral BID Hildred Priest, MD      . LORazepam (ATIVAN) tablet 2 mg  2 mg Oral Q4H PRN Hildred Priest, MD       Or  . LORazepam (ATIVAN) injection 2 mg  2 mg Intramuscular Q4H PRN Hildred Priest, MD      . LORazepam (ATIVAN) tablet 2 mg  2 mg Oral QHS Hernandez-Gonzalez, Seth Bake, MD      . magnesium hydroxide (MILK OF MAGNESIA) suspension 30 mL  30 mL Oral Daily PRN Pucilowska, Jolanta B, MD      . nicotine (NICODERM CQ - dosed in mg/24 hours) patch 21 mg  21 mg Transdermal Daily Hildred Priest, MD       PTA Medications: Prescriptions Prior to Admission  Medication Sig Dispense Refill Last Dose  . acetaminophen (TYLENOL) 500 MG tablet Take 500 mg by mouth daily as needed for mild pain or moderate pain.    PRN at PRN  . fluPHENAZine (PROLIXIN) 2.5 MG/ML injection Inject 2 mg into the muscle every 30 (thirty) days. 2 mg = 0.8 ML   Past Month at Unknown time  . Multiple Vitamin (MULTIVITAMIN) capsule Take 1 capsule by mouth daily.    Past Month at Unknown time    Patient Stressors: Educational concerns Medication change or  noncompliance  Patient Strengths: Ability for insight Active sense of humor Capable of independent living Motivation for treatment/growth Religious Affiliation Supportive family/friends  Treatment Modalities: Medication Management, Group therapy, Case management,  1 to 1 session with clinician, Psychoeducation, Recreational therapy.   Physician Treatment Plan for Primary Diagnosis: Undifferentiated schizophrenia ([place]) Long Term Goal(s): Improvement in symptoms so as ready for discharge Improvement in symptoms so as ready for discharge   Short Term Goals: Ability to demonstrate self-control will improve Compliance with prescribed medications will improve Ability to identify changes in lifestyle to reduce recurrence of condition will improve Ability to identify and develop effective coping behaviors will improve  Medication Management: Evaluate patient's response, side effects, and tolerance of medication regimen.  Therapeutic Interventions: 1 to 1 sessions, Unit Group sessions and Medication administration.  Evaluation of Outcomes: Not Met  Physician Treatment Plan for Secondary Diagnosis: Principal Problem:   Undifferentiated schizophrenia ([place]) Active Problems:   Tobacco use disorder  Long Term Goal(s): Improvement in symptoms so as ready for discharge Improvement in symptoms so as ready for discharge   Short Term Goals: Ability to demonstrate self-control will improve Compliance with prescribed medications will improve Ability to identify changes in lifestyle to reduce recurrence of condition will improve Ability to identify and develop effective coping behaviors will improve     Medication Management: Evaluate patient's response, side effects, and tolerance  of medication regimen.  Therapeutic Interventions: 1 to 1 sessions, Unit Group sessions and Medication administration.  Evaluation of Outcomes: Not Met   RN Treatment Plan for Primary Diagnosis: Undifferentiated  schizophrenia ([place]) Long Term Goal(s): Knowledge of disease and therapeutic regimen to maintain health will improve  Short Term Goals: Ability to demonstrate self-control, Ability to verbalize feelings will improve and Compliance with prescribed medications will improve  Medication Management: RN will administer medications as ordered by provider, will assess and evaluate patient's response and provide education to patient for prescribed medication. RN will report any adverse and/or side effects to prescribing provider.  Therapeutic Interventions: 1 on 1 counseling sessions, Psychoeducation, Medication administration, Evaluate responses to treatment, Monitor vital signs and CBGs as ordered, Perform/monitor CIWA, COWS, AIMS and Fall Risk screenings as ordered, Perform wound care treatments as ordered.  Evaluation of Outcomes: Not Met   LCSW Treatment Plan for Primary Diagnosis: Undifferentiated schizophrenia ([place]) Long Term Goal(s): Safe transition to appropriate next level of care at discharge, Engage patient in therapeutic group addressing interpersonal concerns.  Short Term Goals: Engage patient in aftercare planning with referrals and resources, Increase social support, Increase emotional regulation and Increase skills for wellness and recovery  Therapeutic Interventions: Assess for all discharge needs, 1 to 1 time with Social worker, Explore available resources and support systems, Assess for adequacy in community support network, Educate family and significant other(s) on suicide prevention, Complete Psychosocial Assessment, Interpersonal group therapy.  Evaluation of Outcomes: Progressing   Progress in Treatment: Attending groups: Yes. Participating in groups: No. Taking medication as prescribed: Yes. Toleration medication: Yes. Family/Significant other contact made: Yes, individual(s) contacted:  CSW spoke with patient's brother. Patient understands diagnosis: Yes. Discussing  patient identified problems/goals with staff: Yes. Medical problems stabilized or resolved: Yes. Denies suicidal/homicidal ideation: Yes. Issues/concerns per patient self-inventory: No.  New problem(s) identified: No, Describe:  None.  New Short Term/Long Term Goal(s): Patient unable to state goal at this time.  Discharge Plan or Barriers: CSW assessing appropriate discharge plan.  Reason for Continuation of Hospitalization: Aggression Delusions  Hallucinations  Estimated Length of Stay: 7 days   Attendees: Patient: Dustin [name] 12/01/2016 1:10 PM  Physician: Dr. Merlyn Albert, MD  12/01/2016 1:10 PM  Nursing: Elige Radon, BSN, RN  12/01/2016 1:10 PM  RN Care Manager: 12/01/2016 1:10 PM  Social Worker: Glorious Peach, MSW, LCSW-A 12/01/2016 1:10 PM  Recreational Therapist: Drue Flirt, LRT, [place]  12/01/2016 1:10 PM  Other: Pasty Spillers, PA Student  12/01/2016 1:10 PM  Other:  12/01/2016 1:10 PM  Other: 12/01/2016 1:10 PM    Scribe for Treatment Team: Emilie Rutter, LCSWA 12/01/2016 1:10 PM

## 2016-12-01 NOTE — Plan of Care (Signed)
Problem: Pain Managment: Goal: General experience of comfort will improve Outcome: Progressing Denies pain     

## 2016-12-01 NOTE — BHH Group Notes (Signed)
[place] Group Notes:  (Nursing/MHT/Case Management/Adjunct)  Date:  12/01/2016  Time:  4:33 AM  Type of Therapy:  Psychoeducational Skills  Participation Level:  Active  Participation Quality:  Appropriate  Affect:  Appropriate  Cognitive:  Appropriate  Insight:  Appropriate  Engagement in Group:  Engaged  Modes of Intervention:  Discussion, Socialization and Support  Summary of Progress/Problems:  Dustin [name] 12/01/2016, 4:33 AM

## 2016-12-01 NOTE — Progress Notes (Signed)
[facility] MD Progress Note  12/01/2016 9:16 AM Dustin [name]  MRN:  [id number] Subjective:  Dustin [name] an 61 y.o.male with Schizophreniapresented to our ER under IVC from [place] on 11/28/16. According to the IVC, patient has not been taking his medications and responding responding to voices such as demons, and getting agitated and yelling at neighbors.   Pt was seen on 6/26 at [facility] for his dose of Invega. Staff from [place] ([name], RN) reported patient had been receiving Invega through [facility] [employer] [facility] but was not able to get his injections due to [place] not having anyone to administer. [name] states that patient came to [place] several times to get his injections. She reports that pt will not be able to continue receiving his injection through [place]. [name] also reports pt is hypereligious and has been making sexually inappropriate statements to male staff.   While in the ER he was agitated and aggressive. He was very disorganized.  6/28 Today he refused to speak with me he told me he was busy. He refused breakfast and told the staff that he is in a religious fasting. He refused vital signs and medications. Per nurse's "Tangentially explains why he refuses meds, reporting he is Catholic, is "inflicted," and someone at his outpatient mental health provider told him he does not need to take meds."   6/29 patient has refused medications multiple times. Nonemergency medications have been ordered. The patient required a manual hold this morning in order to administer Prolixin.  During assessment today he was very disorganized, hyperreligious but he was more cooperative and less argumentative than yesterday.  Per nursing: Patient was seen in the milieu. Displaying bizarre behavior, walking back and forth, grimacing. Patient unaware of reality base. Refused his bedtime medication "I have no money for it...take, take. Get out of my room, I need to sleep" Currently sleeping  and staff continue to monitor.    Principal Problem: Undifferentiated schizophrenia ([place]) Diagnosis:   Patient Active Problem List   Diagnosis Date Noted  . Undifferentiated schizophrenia ([place]) [F20.3] 11/29/2016  . Tobacco use disorder [F17.200] 11/29/2016   Total Time spent with patient: 30 minutes    Past Medical History: History reviewed. No pertinent past medical history. History reviewed. No pertinent surgical history.  Family History: History reviewed. No pertinent family history.  Family Psychiatric  History: not known  Social History:  History  Alcohol use Not on file     History  Drug use: Unknown    Social History   Social History  . Marital status: Single    Spouse name: N/A  . Number of children: N/A  . Years of education: N/A   Social History Main Topics  . Smoking status: Current Every Day Smoker  . Smokeless tobacco: Never Used  . Alcohol use None  . Drug use: Unknown  . Sexual activity: Not Asked   Other Topics Concern  . None   Social History Narrative  . None    Current Medications: Current Facility-Administered Medications  Medication Dose Route Frequency Provider Last Rate Last Dose  . acetaminophen (TYLENOL) tablet 650 mg  650 mg Oral Q6H PRN Pucilowska, Jolanta B, MD      . alum & mag hydroxide-simeth (MAALOX/MYLANTA) 200-200-20 MG/5ML suspension 30 mL  30 mL Oral Q4H PRN Pucilowska, Jolanta B, MD      . fluPHENAZine (PROLIXIN) injection 5 mg  5 mg Intramuscular BID Hildred Priest, MD       Or  .  fluPHENAZine (PROLIXIN) tablet 5 mg  5 mg Oral BID Hildred Priest, MD      . LORazepam (ATIVAN) tablet 2 mg  2 mg Oral Q4H PRN Hildred Priest, MD       Or  . LORazepam (ATIVAN) injection 2 mg  2 mg Intramuscular Q4H PRN Hildred Priest, MD      . LORazepam (ATIVAN) tablet 2 mg  2 mg Oral QHS Hernandez-Gonzalez, Seth Bake, MD      . magnesium hydroxide (MILK OF MAGNESIA) suspension 30 mL  30 mL Oral  Daily PRN Pucilowska, Jolanta B, MD      . nicotine (NICODERM CQ - dosed in mg/24 hours) patch 21 mg  21 mg Transdermal Daily Hildred Priest, MD        Lab Results: No results found for this or any previous visit (from the past 48 hour(s)).  Blood Alcohol level:  Lab Results  Component Value Date   ETH <5 81/27/5170    Metabolic Disorder Labs: No results found for: HGBA1C, MPG No results found for: PROLACTIN No results found for: CHOL, TRIG, HDL, CHOLHDL, VLDL, LDLCALC  Physical Findings: AIMS:  , ,  ,  ,    CIWA:    COWS:     Musculoskeletal: Strength & Muscle Tone: within normal limits Gait & Station: normal Patient leans: N/A  Psychiatric Specialty Exam: Physical Exam  Constitutional: He is oriented to person, place, and time. He appears well-developed and well-nourished.  HENT:  Head: Normocephalic and atraumatic.  Eyes: Conjunctivae and EOM are normal.  Neck: Normal range of motion.  Respiratory: Effort normal.  Musculoskeletal: Normal range of motion.  Neurological: He is alert and oriented to person, place, and time.    Review of Systems  Unable to perform ROS: Acuity of condition  Psychiatric/Behavioral: Negative for hallucinations and suicidal ideas. The patient does not have insomnia.     Blood pressure (!) 149/105, pulse 95, temperature 97.8 F (36.6 C), temperature source Oral, resp. rate 18, height 6\' 2"  (1.88 m), weight 70.3 kg (155 lb), SpO2 100 %.Body mass index is 19.9 kg/m.  General Appearance: Disheveled  Eye Contact:  Good  Speech:  Garbled  Volume:  Normal  Mood:  Dysphoric  Affect:  Appropriate and Congruent  Thought Process:  Disorganized and Descriptions of Associations: Loose  Orientation:  Full (Time, Place, and Person)  Thought Content:  Delusions  Suicidal Thoughts:  No  Homicidal Thoughts:  No  Memory:  Immediate;   Poor Recent;   Poor Remote;   Poor  Judgement:  Impaired  Insight:  Lacking  Psychomotor Activity:   Decreased  Concentration:  Concentration: Poor and Attention Span: Poor  Recall:  Poor  Fund of Knowledge:  Poor  Language:  Fair  Akathisia:  No  Handed:    AIMS (if indicated):     Assets:  Armed forces logistics/support/administrative officer Physical Health  ADL's:  Intact  Cognition:  Impaired,  Mild  Sleep:  Number of Hours: 4.5     Treatment Plan Summary: A 61 year old male with schizophrenia. Apparently the patient has encountered greatest difficulties in order to receive his long acting injectable medication. Per [facility] collateral information the patient has not been receiving the injection at [facility] because they didn't have a nurse. Apparently [facility] is unable to see him due to his insurance.  Schizophrenia: Spoke with [facility]. They confirm patient was actually on Prolixin Decanoate. He claimed that the dose was 20 mg which is unusual. Patient has been started on Prolixin 5  mg twice a day. He has been refusing oral antipsychotics therefore he has orders for nonemergency forced medications with Prolixin IM.  Manual hold today at 10:13 am  Tobacco use disorder however nicotine patch 21 mg  Agitation patient has orders for Ativan 2 mg every 4 hours when necessary PO or IM  Insomnia patient has orders for Ativan 2 mg by mouth daily at bedtime  Vital signs daily  Diet regular  Precautions every 15 minute checks  Hospitalization status involuntary commitment  Labs we will order hemoglobin A1c, lipid panel and TSH--- refusing labs   Hildred Priest, MD 12/01/2016, 9:16 AM

## 2016-12-01 NOTE — Progress Notes (Signed)
Patient was isolative to room at the beginning of shift. Patient refused morning medication of prolixin 5 mg PO. Patient was attempting to push nurse away and stated "the white people are trying to poison him and I am not going to take no medicine". Patient was having delusions and conversations was incoherent. Patient refused several times before prolixin injection was given. After administration of medication patient was less irritable and asked for hygiene products to take a shower. Patient refused to eat breakfast, but after shower patient came out and sat in dayroom until lunch was served. Patient did not attend group, but came out to watch TV in the dayroom periodically. Patient is alert and oriented x 4, breathing unlabored, and extremities x 4 within normal limits.  Patient continues to be monitored on 15 minute safety checks. Will continue to monitor patient and notify MD of any changes.

## 2016-12-01 NOTE — Plan of Care (Signed)
Problem: Coping: Goal: Ability to verbalize feelings will improve Outcome: Not Progressing Pt ' thought process disorganized

## 2016-12-02 MED ORDER — FLUPHENAZINE DECANOATE 25 MG/ML IJ SOLN
50.0000 mg | Freq: Once | INTRAMUSCULAR | Status: DC
  Filled 2016-12-02: qty 2

## 2016-12-02 MED ORDER — LISINOPRIL 5 MG PO TABS
5.0000 mg | ORAL_TABLET | Freq: Every day | ORAL | Status: DC
  Administered 2016-12-03 – 2016-12-04 (×2): 5 mg via ORAL
  Filled 2016-12-02 (×3): qty 1

## 2016-12-02 NOTE — Progress Notes (Signed)
In the morning nurse and tech went into patient room to offer medication and breakfast and patient started yelling, cursing, and posturing towards staff. Patient stated "Get the hell out of my room, I'm not taking any medication, get out"! IM prolixin was given. Patient stayed in room for a while and then came out for snack and was pleasant. Patient went outside for fresh air for a little bit. When patient came back in from courtyard, patient had stumble into the foyer and expressed no pain or injury. Patient refused lisinopril because he says he don't have high blood pressure and we have the wrong person, this is not my face. Patient is alert and oriented x 4, breathing unlabored, and extremities x 4 within normal limits. Patient continues to be monitored on 15 minute safety checks. Will continue to monitor patient and notify MD of any changes.

## 2016-12-02 NOTE — Plan of Care (Signed)
Problem: Safety: Goal: Ability to remain free from injury will improve Outcome: Progressing Fall precautions reinforced

## 2016-12-02 NOTE — Plan of Care (Signed)
Problem: Coping: Goal: Ability to cope will improve Outcome: Progressing Patient doesn't appear to be responding to internal stimuli, but in the morning patient is aggressive and uncooperative. Goal: Ability to verbalize feelings will improve Outcome: Not Progressing Patient does not verbalize feelings or thoughts and keeps to self.

## 2016-12-02 NOTE — Plan of Care (Signed)
Problem: Activity: Goal: Risk for activity intolerance will decrease Outcome: Progressing Pt is tolerates activities. Getting rest as needed

## 2016-12-02 NOTE — Plan of Care (Signed)
Problem: Coping: Goal: Ability to verbalize frustrations and anger appropriately will improve Outcome: Not Progressing Unable to communicate his needs and concerns appropriately

## 2016-12-02 NOTE — Progress Notes (Signed)
Patient is in and out of the room, guarded, irritable, angry and utilizing inappropriate communication. Resistant during assessment, cursing, delusional and sexually inappropriate. Preoccupied and talking to self. Not cooperative. Took medications with encouragements but stated "If I take them medications I can't make babies". Support and encouragements provided. Safety and fall precautions reinforced.

## 2016-12-02 NOTE — BHH Group Notes (Signed)
[place] Group Notes:  (Nursing/MHT/Case Management/Adjunct)  Date:  12/02/2016  Time:  5:23 AM  Type of Therapy:  Psychoeducational Skills  Participation Level:  Did Not Attend  Summary of Progress/Problems:  Reece Agar 12/02/2016, 5:23 AM

## 2016-12-02 NOTE — Plan of Care (Signed)
Problem: Health Behavior/Discharge Planning: Goal: Ability to manage health-related needs will improve Outcome: Not Progressing Resistant to treatment, irritable, angry

## 2016-12-02 NOTE — Progress Notes (Signed)
[facility] MD Progress Note  12/02/2016 12:44 PM Dustin [name]  MRN:  [id number] Subjective:  Dustin [name] an 61 y.o.male with Schizophreniapresented to our ER under IVC from [place] on 11/28/16. According to the IVC, patient has not been taking his medications and responding responding to voices such as demons, and getting agitated and yelling at neighbors.   Pt was seen on 6/26 at [facility] for his dose of Invega. Staff from [place] ([name], RN) reported patient had been receiving Invega through [employer] but was not able to get his injections due to [place] not having anyone to administer. [name] states that patient came to [place] several times to get his injections. She reports that pt will not be able to continue receiving his injection through [place]. [name] also reports pt is hypereligious and has been making sexually inappropriate statements to male staff.   While in the ER he was agitated and aggressive. He was very disorganized.  6/28 Today he refused to speak with me he told me he was busy. He refused breakfast and told the staff that he is in a religious fasting. He refused vital signs and medications. Per nurse's "Tangentially explains why he refuses meds, reporting he is Catholic, is "inflicted," and someone at his outpatient mental health provider told him he does not need to take meds."   6/29 patient has refused medications multiple times. Nonemergency medications have been ordered. The patient required a manual hold this morning in order to administer Prolixin.  During assessment today he was very disorganized, hyperreligious but he was more cooperative and less argumentative than yesterday.  Follow-up for Saturday the 49th. 61 year old man with schizophrenia. Patient continues to require forced medicines. On interview today he has no specific complaints but continues to be disorganized and bizarre in his speech. Hard to follow what he is saying. Only  adequate levels of self-care observed. I also note that his blood pressure continues to run high which is not an issue that has been previously addressed. Not sure if he is going to be agreeable to medication.  Per nursing: Patient was seen in the milieu. Displaying bizarre behavior, walking back and forth, grimacing. Patient unaware of reality base. Refused his bedtime medication "I have no money for it...take, take. Get out of my room, I need to sleep" Currently sleeping and staff continue to monitor.    Principal Problem: Undifferentiated schizophrenia ([place]) Diagnosis:   Patient Active Problem List   Diagnosis Date Noted  . Undifferentiated schizophrenia ([place]) [F20.3] 11/29/2016  . Tobacco use disorder [F17.200] 11/29/2016   Total Time spent with patient: 30 minutes    Past Medical History: History reviewed. No pertinent past medical history. History reviewed. No pertinent surgical history.  Family History: History reviewed. No pertinent family history.  Family Psychiatric  History: not known  Social History:  History  Alcohol use Not on file     History  Drug use: Unknown    Social History   Social History  . Marital status: Single    Spouse name: N/A  . Number of children: N/A  . Years of education: N/A   Social History Main Topics  . Smoking status: Current Every Day Smoker  . Smokeless tobacco: Never Used  . Alcohol use None  . Drug use: Unknown  . Sexual activity: Not Asked   Other Topics Concern  . None   Social History Narrative  . None    Current Medications: Current Facility-Administered Medications  Medication Dose Route Frequency Provider Last Rate Last Dose  . acetaminophen (TYLENOL) tablet 650 mg  650 mg Oral Q6H PRN Pucilowska, Jolanta B, MD      . alum & mag hydroxide-simeth (MAALOX/MYLANTA) 200-200-20 MG/5ML suspension 30 mL  30 mL Oral Q4H PRN Pucilowska, Jolanta B, MD      . fluPHENAZine (PROLIXIN) injection 5 mg  5 mg Intramuscular BID  Hildred Priest, MD   5 mg at 12/02/16 0835   Or  . fluPHENAZine (PROLIXIN) tablet 5 mg  5 mg Oral BID Hildred Priest, MD   5 mg at 12/01/16 1616  . LORazepam (ATIVAN) tablet 2 mg  2 mg Oral Q4H PRN Hildred Priest, MD       Or  . LORazepam (ATIVAN) injection 2 mg  2 mg Intramuscular Q4H PRN Hildred Priest, MD      . LORazepam (ATIVAN) tablet 2 mg  2 mg Oral QHS Hildred Priest, MD   2 mg at 12/01/16 1615  . magnesium hydroxide (MILK OF MAGNESIA) suspension 30 mL  30 mL Oral Daily PRN Pucilowska, Jolanta B, MD      . nicotine (NICODERM CQ - dosed in mg/24 hours) patch 21 mg  21 mg Transdermal Daily Hildred Priest, MD        Lab Results: No results found for this or any previous visit (from the past 48 hour(s)).  Blood Alcohol level:  Lab Results  Component Value Date   ETH <5 10/93/2355    Metabolic Disorder Labs: No results found for: HGBA1C, MPG No results found for: PROLACTIN No results found for: CHOL, TRIG, HDL, CHOLHDL, VLDL, LDLCALC  Physical Findings: AIMS:  , ,  ,  ,    CIWA:    COWS:     Musculoskeletal: Strength & Muscle Tone: within normal limits Gait & Station: normal Patient leans: N/A  Psychiatric Specialty Exam: Physical Exam  Nursing note and vitals reviewed. Constitutional: He is oriented to person, place, and time. He appears well-developed and well-nourished.  HENT:  Head: Normocephalic and atraumatic.  Eyes: Conjunctivae and EOM are normal. Pupils are equal, round, and reactive to light.  Neck: Normal range of motion.  Cardiovascular: Regular rhythm and normal heart sounds.   Respiratory: Effort normal. No respiratory distress.  GI: Soft.  Musculoskeletal: Normal range of motion.  Neurological: He is alert and oriented to person, place, and time.  Skin: Skin is warm and dry.  Psychiatric: His affect is blunt. His speech is delayed and tangential. He is withdrawn. Thought content  is paranoid and delusional. Cognition and memory are impaired. He expresses impulsivity. He expresses no homicidal and no suicidal ideation.    Review of Systems  Unable to perform ROS: Acuity of condition  Constitutional: Negative.   HENT: Negative.   Eyes: Negative.   Respiratory: Negative.   Cardiovascular: Negative.   Gastrointestinal: Negative.   Musculoskeletal: Negative.   Skin: Negative.   Neurological: Negative.   Psychiatric/Behavioral: Negative for hallucinations and suicidal ideas. The patient does not have insomnia.     Blood pressure (!) 140/109, pulse (!) 102, temperature 98.7 F (37.1 C), temperature source Oral, resp. rate 18, height 6\' 2"  (1.88 m), weight 155 lb (70.3 kg), SpO2 100 %.Body mass index is 19.9 kg/m.  General Appearance: Disheveled  Eye Contact:  Good  Speech:  Garbled  Volume:  Normal  Mood:  Dysphoric  Affect:  Appropriate and Congruent  Thought Process:  Disorganized and Descriptions of Associations: Loose  Orientation:  Full (Time,  Place, and Person)  Thought Content:  Delusions  Suicidal Thoughts:  No  Homicidal Thoughts:  No  Memory:  Immediate;   Poor Recent;   Poor Remote;   Poor  Judgement:  Impaired  Insight:  Lacking  Psychomotor Activity:  Decreased  Concentration:  Concentration: Poor and Attention Span: Poor  Recall:  Poor  Fund of Knowledge:  Poor  Language:  Fair  Akathisia:  No  Handed:    AIMS (if indicated):     Assets:  Armed forces logistics/support/administrative officer Physical Health  ADL's:  Intact  Cognition:  Impaired,  Mild  Sleep:  Number of Hours: 4.15     Treatment Plan Summary: A 61 year old male with schizophrenia. Apparently the patient has encountered greatest difficulties in order to receive his long acting injectable medication. Per [facility] collateral information the patient has not been receiving the injection at [facility] because they didn't have a nurse. Apparently [facility] is unable to see him due to his insurance.  Schizophrenia:  Spoke with [facility]. They confirm patient was actually on Prolixin Decanoate. He claimed that the dose was 20 mg which is unusual. Patient has been started on Prolixin 5 mg twice a day. He has been refusing oral antipsychotics therefore he has orders for nonemergency forced medications with Prolixin IM.  Manual hold today at 10:13 am  Tobacco use disorder however nicotine patch 21 mg  Agitation patient has orders for Ativan 2 mg every 4 hours when necessary PO or IM  Insomnia patient has orders for Ativan 2 mg by mouth daily at bedtime  Vital signs daily  Diet regular  Precautions every 15 minute checks  Hospitalization status involuntary commitment  Labs we will order hemoglobin A1c, lipid panel and TSH--- refusing labs  Patient continues to be bizarre and not to interact much. No sign of ability to make reasonable decisions or self-care. Continue current medication orders. Consider the possibility of long-acting injectable sooner rather than later. I am going to add blood pressure medicine and have explained the rationale to the patient. Patient was seen today for an evaluation after a brief hold for forced medicine and has no new complaints.   Alethia Berthold, MD 12/02/2016, 12:44 PM

## 2016-12-02 NOTE — Progress Notes (Signed)
Patient ID: Dustin [name], male   DOB: 07/10/1955, 61 y.o.   MRN: [id number] Psychiatry: Brief note regarding hold necessary for forced medicines. Patient was administered forced medicines this morning and required brief hold of his arms only to prevent any dangerous movements on his part wall medication was administered by IM injection. Patient was evaluated as required. No sign of any injury. Patient has no complaints. No sign of any adverse consequences of medication or hold. Patient had already been released. The hold was only probably a minute or less and required only for safety during medicine administration. No further changed plan regarding forced medicines and holds.

## 2016-12-02 NOTE — Progress Notes (Signed)
D: Pt denies SI/HI/AVH. Pt is irritable, angry and not cooperative with treatment plan. Patient appears anxious and he is not interacting with peers and staff appropriately.  A: Pt was offered support and encouragement. Pt was given scheduled medications. Pt was encouraged to attend groups. Q 15 minute checks were done for safety.  R:Pt did not attend group. Patient refused medication. Pt is not  receptive to treatment. Safety maintained on unit.

## 2016-12-02 NOTE — Plan of Care (Signed)
Problem: Pain Managment: Goal: General experience of comfort will improve Outcome: Progressing Pain assessed as indicated: pt denies pain

## 2016-12-02 NOTE — BHH Group Notes (Signed)
[place] LCSW Group Therapy  12/02/2016 3:08 PM  Type of Therapy:  Group Therapy  Participation Level:  Did Not Attend  Summary of Progress/Problems: Communications: Patients identify how individuals communicate with one another appropriately and inappropriately. Patients will be guided to discuss their thoughts, feelings, and behaviors related to barriers when communicating. The group will process together ways to execute positive and appropriate communications.   Sadarius Norman G. Claybon Jabs MSW, [place] 12/02/2016, 3:08 PM

## 2016-12-03 NOTE — Plan of Care (Signed)
Problem: Pain Managment: Goal: General experience of comfort will improve Outcome: Progressing Pain assessed as indicated. Denies pain

## 2016-12-03 NOTE — BHH Group Notes (Signed)
[place] LCSW Group Therapy  12/03/2016 2:26 PM  Type of Therapy:  Group Therapy  Participation Level:  Did Not Attend  Summary of Progress/Problems: Stress management: Patients defined and discussed the topic of stress and the related symptoms and triggers for stress. Patients identified healthy coping skills they would like to try during hospitalization and after discharge to manage stress in a healthy way. CSW offered insight to varying stress management techniques.   Song Myre G. Berkley, [place] 12/03/2016, 2:26 PM

## 2016-12-03 NOTE — Progress Notes (Signed)
Pt calm and cooperative. He denies SI, HI, a/v hallucinations. Pt is medication compliant he took his medications willingly. He is visible on unit. No aggressive or hostile behaviors noted. Will continue to monitor.

## 2016-12-03 NOTE — Plan of Care (Signed)
Problem: Activity: Goal: Risk for activity intolerance will decrease Outcome: Progressing Pt able to rest as needed

## 2016-12-03 NOTE — Progress Notes (Signed)
Patient has been in the milieu, often in the dayroom with staff and peers. Alert and oriented but displaying disorganized behavior. pleasantand redirectable. Willing to take medications and apologizing for previous behavior. Support and encouragements provided. Staff continue to monitor and to promote therapeutic milieu.

## 2016-12-03 NOTE — [facility] Group Notes (Signed)
[facility] Group Notes:  (Nursing/MHT/Case Management/Adjunct)  Date:  12/03/2016  Time:  12:47 AM  Type of Therapy:  Group Therapy  Participation Level:  Did Not Attend  Participation Quality:  Summary of Progress/Problems:  Dustin [name] 12/03/2016, 12:47 AM

## 2016-12-03 NOTE — Plan of Care (Signed)
Problem: Safety: Goal: Ability to remain free from injury will improve Outcome: Progressing Pt is safe. Safety precautions reinforced

## 2016-12-03 NOTE — Plan of Care (Signed)
Problem: Health Behavior/Discharge Planning: Goal: Compliance with prescribed medication regimen will improve Outcome: Progressing Taking medications per encouragements

## 2016-12-03 NOTE — Plan of Care (Signed)
Problem: Coping: Goal: Ability to verbalize frustrations and anger appropriately will improve Outcome: Progressing No sign of anger. Pleasant and cooperative during this shift

## 2016-12-03 NOTE — Progress Notes (Signed)
[facility] MD Progress Note  12/03/2016 1:49 PM MONTI JILEK  MRN:  [id number] Subjective:  Dustin [name] an 61 y.o.male with Schizophreniapresented to our ER under IVC from [place] on 11/28/16. According to the IVC, patient has not been taking his medications and responding responding to voices such as demons, and getting agitated and yelling at neighbors.   Pt was seen on 6/26 at [facility] for his dose of Invega. Staff from [facility] ([name], RN) reported patient had been receiving Invega through [facility] but was not able to get his injections due to [facility] not having anyone to administer. [name] states that patient came to [place] several times to get his injections. She reports that pt will not be able to continue receiving his injection through [facility]. [name] also reports pt is hypereligious and has been making sexually inappropriate statements to male staff.   While in the ER he was agitated and aggressive. He was very disorganized.  6/28 Today he refused to speak with me he told me he was busy. He refused breakfast and told the staff that he is in a religious fasting. He refused vital signs and medications. Per nurse's "Tangentially explains why he refuses meds, reporting he is Catholic, is "inflicted," and someone at his outpatient mental health provider told him he does not need to take meds."   6/29 patient has refused medications multiple times. Nonemergency medications have been ordered. The patient required a manual hold this morning in order to administer Prolixin.  During assessment today he was very disorganized, hyperreligious but he was more cooperative and less argumentative than yesterday.  Follow-up for Saturday the 60th. 61 year old man with schizophrenia. Patient continues to require forced medicines. On interview today he has no specific complaints but continues to be disorganized and bizarre in his speech. Hard to follow what he is saying. Only  adequate levels of self-care observed. I also note that his blood pressure continues to run high which is not an issue that has been previously addressed. Not sure if he is going to be agreeable to medication.  Follow-up Sunday, July 1. Patient seems to me better today. He was actually rather polite and coherent in his conversation. Shook my hand. Told me that he could not remember why he was in the hospital. Made some mention of taking his medicine. He mostly stays in his room but has been more cooperative. He did not require a hold for forced medicine this morning.  Per nursing: Patient was seen in the milieu. Displaying bizarre behavior, walking back and forth, grimacing. Patient unaware of reality base. Refused his bedtime medication "I have no money for it...take, take. Get out of my room, I need to sleep" Currently sleeping and staff continue to monitor.    Principal Problem: Undifferentiated schizophrenia ([place]) Diagnosis:   Patient Active Problem List   Diagnosis Date Noted  . Undifferentiated schizophrenia ([place]) [F20.3] 11/29/2016  . Tobacco use disorder [F17.200] 11/29/2016   Total Time spent with patient: 30 minutes    Past Medical History: History reviewed. No pertinent past medical history. History reviewed. No pertinent surgical history.  Family History: History reviewed. No pertinent family history.  Family Psychiatric  History: not known  Social History:  History  Alcohol use Not on file     History  Drug use: Unknown    Social History   Social History  . Marital status: Single    Spouse name: N/A  . Number of children: N/A  .  Years of education: N/A   Social History Main Topics  . Smoking status: Current Every Day Smoker  . Smokeless tobacco: Never Used  . Alcohol use None  . Drug use: Unknown  . Sexual activity: Not Asked   Other Topics Concern  . None   Social History Narrative  . None    Current Medications: Current Facility-Administered  Medications  Medication Dose Route Frequency Provider Last Rate Last Dose  . acetaminophen (TYLENOL) tablet 650 mg  650 mg Oral Q6H PRN Pucilowska, Jolanta B, MD   650 mg at 12/03/16 0748  . alum & mag hydroxide-simeth (MAALOX/MYLANTA) 200-200-20 MG/5ML suspension 30 mL  30 mL Oral Q4H PRN Pucilowska, Jolanta B, MD      . fluPHENAZine (PROLIXIN) injection 5 mg  5 mg Intramuscular BID Hildred Priest, MD   5 mg at 12/02/16 0835   Or  . fluPHENAZine (PROLIXIN) tablet 5 mg  5 mg Oral BID Hildred Priest, MD   5 mg at 12/03/16 0748  . fluPHENAZine decanoate (PROLIXIN) injection 50 mg  50 mg Intramuscular Once Aldora Perman T, MD      . lisinopril (PRINIVIL,ZESTRIL) tablet 5 mg  5 mg Oral Daily Japneet Staggs, [name], MD   5 mg at 12/03/16 0748  . LORazepam (ATIVAN) tablet 2 mg  2 mg Oral Q4H PRN Hildred Priest, MD       Or  . LORazepam (ATIVAN) injection 2 mg  2 mg Intramuscular Q4H PRN Hildred Priest, MD      . LORazepam (ATIVAN) tablet 2 mg  2 mg Oral QHS Hildred Priest, MD   2 mg at 12/02/16 1958  . magnesium hydroxide (MILK OF MAGNESIA) suspension 30 mL  30 mL Oral Daily PRN Pucilowska, Jolanta B, MD      . nicotine (NICODERM CQ - dosed in mg/24 hours) patch 21 mg  21 mg Transdermal Daily Hildred Priest, MD        Lab Results: No results found for this or any previous visit (from the past 48 hour(s)).  Blood Alcohol level:  Lab Results  Component Value Date   ETH <5 26/83/4196    Metabolic Disorder Labs: No results found for: HGBA1C, MPG No results found for: PROLACTIN No results found for: CHOL, TRIG, HDL, CHOLHDL, VLDL, LDLCALC  Physical Findings: AIMS:  , ,  ,  ,    CIWA:    COWS:     Musculoskeletal: Strength & Muscle Tone: within normal limits Gait & Station: normal Patient leans: N/A  Psychiatric Specialty Exam: Physical Exam  Nursing note and vitals reviewed. Constitutional: He is oriented to person,  place, and time. He appears well-developed and well-nourished.  HENT:  Head: Normocephalic and atraumatic.  Eyes: Conjunctivae and EOM are normal. Pupils are equal, round, and reactive to light.  Neck: Normal range of motion.  Cardiovascular: Regular rhythm and normal heart sounds.   Respiratory: Effort normal. No respiratory distress.  GI: Soft.  Musculoskeletal: Normal range of motion.  Neurological: He is alert and oriented to person, place, and time.  Skin: Skin is warm and dry.  Psychiatric: His affect is blunt. His speech is delayed and tangential. He is withdrawn. Thought content is delusional. Thought content is not paranoid. Cognition and memory are impaired. He expresses impulsivity. He expresses no homicidal and no suicidal ideation.    Review of Systems  Unable to perform ROS: Acuity of condition  Constitutional: Negative.   HENT: Negative.   Eyes: Negative.   Respiratory: Negative.  Cardiovascular: Negative.   Gastrointestinal: Negative.   Musculoskeletal: Negative.   Skin: Negative.   Neurological: Negative.   Psychiatric/Behavioral: Negative for hallucinations and suicidal ideas. The patient does not have insomnia.     Blood pressure (!) 87/67, pulse 90, temperature 98.7 F (37.1 C), temperature source Oral, resp. rate 18, height 6\' 2"  (1.88 m), weight 155 lb (70.3 kg), SpO2 100 %.Body mass index is 19.9 kg/m.  General Appearance: Disheveled  Eye Contact:  Good  Speech:  Garbled  Volume:  Normal  Mood:  Dysphoric  Affect:  Appropriate and Congruent  Thought Process:  Disorganized and Descriptions of Associations: Loose  Orientation:  Full (Time, Place, and Person)  Thought Content:  Delusions  Suicidal Thoughts:  No  Homicidal Thoughts:  No  Memory:  Immediate;   Poor Recent;   Poor Remote;   Poor  Judgement:  Impaired  Insight:  Lacking  Psychomotor Activity:  Decreased  Concentration:  Concentration: Poor and Attention Span: Poor  Recall:  Poor  Fund  of Knowledge:  Poor  Language:  Fair  Akathisia:  No  Handed:    AIMS (if indicated):     Assets:  Armed forces logistics/support/administrative officer Physical Health  ADL's:  Intact  Cognition:  Impaired,  Mild  Sleep:  Number of Hours: 7.5     Treatment Plan Summary: A 61 year old male with schizophrenia. Apparently the patient has encountered greatest difficulties in order to receive his long acting injectable medication. Per [facility] collateral information the patient has not been receiving the injection at [facility] because they didn't have a nurse. Apparently [facility] is unable to see him due to his insurance.  Schizophrenia: Spoke with [facility]. They confirm patient was actually on Prolixin Decanoate. He claimed that the dose was 20 mg which is unusual. Patient has been started on Prolixin 5 mg twice a day. He has been refusing oral antipsychotics therefore he has orders for nonemergency forced medications with Prolixin IM.  Manual hold today at 10:13 am  Tobacco use disorder however nicotine patch 21 mg  Agitation patient has orders for Ativan 2 mg every 4 hours when necessary PO or IM  Insomnia patient has orders for Ativan 2 mg by mouth daily at bedtime  Vital signs daily  Diet regular  Precautions every 15 minute checks  Hospitalization status involuntary commitment  Labs we will order hemoglobin A1c, lipid panel and TSH--- refusing labs  Patient continues to be bizarre and not to interact much. No sign of ability to make reasonable decisions or self-care. Continue current medication orders. Consider the possibility of long-acting injectable sooner rather than later. I am going to add blood pressure medicine and have explained the rationale to the patient. Patient was seen today for an evaluation after a brief hold for forced medicine and has no new complaints.  I tried to order a Prolixin Decanoate shot yesterday and the patient refused that but has been taking the oral Prolixin. Seems to be a little bit  better today. Treatment team might want to consider offering him the decanoate shot again prior to discharge. No change to medicine for today.  Alethia Berthold, MD 12/03/2016, 1:49 PM

## 2016-12-03 NOTE — Plan of Care (Signed)
Problem: Health Behavior/Discharge Planning: Goal: Ability to manage health-related needs will improve Outcome: Progressing Pt is less aggressive. Pleasant and seen in the dayroom with staff and peers

## 2016-12-03 NOTE — Plan of Care (Signed)
Problem: Physical Regulation: Goal: Ability to maintain clinical measurements within normal limits will improve Outcome: Progressing Pt's behavior slightly improving. Pt is pleasant but remains bizarre.

## 2016-12-03 NOTE — Plan of Care (Signed)
Problem: Safety: Goal: Ability to remain free from injury will improve Outcome: Progressing Pt remains safe in hospital at this time. Pt displays safe behaviors.

## 2016-12-04 MED ORDER — LORAZEPAM 1 MG PO TABS
1.0000 mg | ORAL_TABLET | Freq: Every day | ORAL | Status: DC
  Administered 2016-12-04 – 2016-12-05 (×2): 1 mg via ORAL
  Filled 2016-12-04 (×2): qty 1

## 2016-12-04 MED ORDER — DIPHENHYDRAMINE HCL 25 MG PO CAPS
25.0000 mg | ORAL_CAPSULE | Freq: Every day | ORAL | Status: DC
  Administered 2016-12-04 – 2016-12-12 (×9): 25 mg via ORAL
  Filled 2016-12-04 (×9): qty 1

## 2016-12-04 NOTE — [facility] Group Notes (Signed)
[facility] Group Notes:  (Nursing/MHT/Case Management/Adjunct)  Date:  12/04/2016  Time:  12:27 AM  Type of Therapy:  Evening Wrap-up Group  Participation Level:  Did Not Attend  Participation Quality:  N/A  Affect:  N/A  Cognitive:  N/A  Insight:  None  Engagement in Group:  Did Not Attend  Modes of Intervention:  Activity  Summary of Progress/Problems:  Dustin [name] 12/04/2016, 12:27 AM

## 2016-12-04 NOTE — Plan of Care (Signed)
Problem: Coping: Goal: Ability to demonstrate self-control will improve Outcome: Progressing No behavioral issues noted today. Pt cooperative, but isolative.

## 2016-12-04 NOTE — Progress Notes (Signed)
CH. visited with Pt. [place]. prayed for Pt. Pt. spoke in very confusing terms about his life and his brothers and sisters.

## 2016-12-04 NOTE — Progress Notes (Addendum)
[facility] MD Progress Note  12/04/2016 1:47 PM Dustin [name]  MRN:  [id number] Subjective:    Dustin [name] is a 61 yo african-american male who presented to the ER under IVC from [place] on 11/28/16.  According to the IVC, the patient had not been taking medications and that the patient was stated he could hear demon voices, and was agitated and yelling at neighbors.  Pt was seen at [facility] for his does of Invega, and staff from [facility] reported that the patient had been getting it from [facility], but was not able to get his last injection bc nobody was there to administer it.    [name] also reports pt is hypereligious and has been making sexually inappropriate statements to male staff.   While in the ER he was agitated and aggressive. He was very disorganized.  6/28 Today he refused to speak with me he told me he was busy. He refused breakfast and told the staff that he is in areligious fasting. He refused vital signs and medications. Per nurse's "Tangentially explains why he refuses meds, reporting he is Catholic, is "inflicted," and someone at his outpatient mental health provider told him he does not need to take meds."   6/29 patient has refused medications multiple times. Nonemergency medications have been ordered. The patient required a manual hold this morning in order to administer Prolixin. During assessment today he was very disorganized, hyperreligious but he was more cooperative and less argumentative than yesterday  7/2: pt seems to be improving.  He was talkative and a little more friendly than before.  He reports that he slept ok last night, but it took him a little longer than usual to fall asleep.  He is no longer fasting and went to lunch today.  Denies any depression, anxiety, or suicidal/homicidal ideations.  Speech is garble and hard to comprehend.  Patient says he is still fasting. Feels he is praying too much.  Unhappy about having to take meds, says he doesn't need them.    He wrote (scribble)all over the questioners sheets. He also wrote on 2 stere foam cups and his name tag on the room's door.  Per nursing:  Patient has been in the milieu, often in the dayroom with staff and peers. Alert and oriented but displaying disorganized behavior. pleasantand redirectable. Willing to take medications and apologizing for previous behavior. Support and encouragements provided. Staff continue to monitor and to promote therapeutic milieu.   Principal Problem: Undifferentiated schizophrenia ([place]) Diagnosis:   Patient Active Problem List   Diagnosis Date Noted  . Undifferentiated schizophrenia ([place]) [F20.3] 11/29/2016  . Tobacco use disorder [F17.200] 11/29/2016   Total Time spent with patient: 30 minutes    Past Medical History: History reviewed. No pertinent past medical history. History reviewed. No pertinent surgical history.  Family History: History reviewed. No pertinent family history.  Family Psychiatric  History: no pertinent family hx  Social History:  History  Alcohol use Not on file     History  Drug use: Unknown    Social History   Social History  . Marital status: Single    Spouse name: N/A  . Number of children: N/A  . Years of education: N/A   Social History Main Topics  . Smoking status: Current Every Day Smoker  . Smokeless tobacco: Never Used  . Alcohol use None  . Drug use: Unknown  . Sexual activity: Not Asked   Other Topics Concern  . None   Social History  Narrative  . None    Sleep: Good  Appetite:  Fair  Current Medications: Current Facility-Administered Medications  Medication Dose Route Frequency Provider Last Rate Last Dose  . acetaminophen (TYLENOL) tablet 650 mg  650 mg Oral Q6H PRN Pucilowska, Jolanta B, MD   650 mg at 12/03/16 0748  . alum & mag hydroxide-simeth (MAALOX/MYLANTA) 200-200-20 MG/5ML suspension 30 mL  30 mL Oral Q4H PRN Pucilowska, Jolanta B, MD      . fluPHENAZine (PROLIXIN) injection 5 mg  5  mg Intramuscular BID Hildred Priest, MD   5 mg at 12/02/16 0835   Or  . fluPHENAZine (PROLIXIN) tablet 5 mg  5 mg Oral BID Hildred Priest, MD   5 mg at 12/04/16 0649  . fluPHENAZine decanoate (PROLIXIN) injection 50 mg  50 mg Intramuscular Once [name], [name], MD      . LORazepam (ATIVAN) tablet 2 mg  2 mg Oral Q4H PRN Hildred Priest, MD       Or  . LORazepam (ATIVAN) injection 2 mg  2 mg Intramuscular Q4H PRN Hildred Priest, MD      . LORazepam (ATIVAN) tablet 2 mg  2 mg Oral QHS Hildred Priest, MD   2 mg at 12/03/16 2115  . magnesium hydroxide (MILK OF MAGNESIA) suspension 30 mL  30 mL Oral Daily PRN Pucilowska, Jolanta B, MD      . nicotine (NICODERM CQ - dosed in mg/24 hours) patch 21 mg  21 mg Transdermal Daily Hildred Priest, MD        Lab Results: No results found for this or any previous visit (from the past 48 hour(s)).  Blood Alcohol level:  Lab Results  Component Value Date   ETH <5 16/60/6301    Metabolic Disorder Labs: No results found for: HGBA1C, MPG No results found for: PROLACTIN No results found for: CHOL, TRIG, HDL, CHOLHDL, VLDL, LDLCALC  Physical Findings: AIMS:  , ,  ,  ,    CIWA:    COWS:     Musculoskeletal: Strength & Muscle Tone: within normal limits Gait & Station: normal Patient leans: N/A  Psychiatric Specialty Exam: Physical Exam  Vitals reviewed. Constitutional: He is oriented to person, place, and time. He appears well-developed and well-nourished.  Respiratory: Effort normal and breath sounds normal.  Neurological: He is alert and oriented to person, place, and time.  Skin: Skin is warm and dry.  Psychiatric: His mood appears not anxious. He is not agitated. Cognition and memory are impaired. He does not exhibit a depressed mood. He expresses no homicidal and no suicidal ideation.    Review of Systems  Unable to perform ROS: Acuity of condition    Blood pressure  101/64, pulse (!) 118, temperature 98.7 F (37.1 C), temperature source Oral, resp. rate 18, height 6\' 2"  (1.88 m), weight 70.3 kg (155 lb), SpO2 100 %.Body mass index is 19.9 kg/m.  General Appearance: Disheveled  Eye Contact:  Fair  Speech:  Garbled  Volume:  Normal  Mood:  Euthymic  Affect:  Appropriate  Thought Process:  Disorganized and Descriptions of Associations: Loose  Orientation:  Full (Time, Place, and Person)  Thought Content:  Delusions  Suicidal Thoughts:  No  Homicidal Thoughts:  No  Memory:  Immediate;   Poor Recent;   Poor Remote;   Poor  Judgement:  Poor  Insight:  Lacking  Psychomotor Activity:  Normal  Concentration:  Concentration: Poor and Attention Span: Poor  Recall:  Poor  Fund of Knowledge:  Poor  Language:  Poor  Akathisia:  No  Handed:   AIMS (if indicated):     Assets:  Communication Skills Physical Health  ADL's:  Intact  Cognition:  Impaired,  Mild  Sleep:  Number of Hours: 5.15     Treatment Plan Summary:  Pt is a 61 yo male with diagnosis of schizophrenia.  Pt is refusing to take medications and an order for nonemergency forced medications with prolixin IM was placed.    Schizoprenia: stable,  Confirmed with [facility] that pt was on prolixin deconate, and started pt on prolixin 5 mg bid.  As mentioned above, orders were placed for nonemergency forced medications.  The first dose of prolixin was given at 10:15 AM on Friday 6/29. Manual hold occurred on 10:13 on Friday 6/29.  No restraints since last week. He continues to be on nonemergency forced medications.  He is more cooperative but still delusional and hyper religious   Agitation: continue orders for ativan 2 mg q4hr prn po or im. Patient did not have any episodes of agitation with the weekend  Insomnia: improving. I will start him on Benadryl 25 mg daily at bedtime and I will decrease the Ativan to only 1 mg by mouth daily at bedtime.  Tobacco use disorder: Continue nicotine  patch  Diet: regular, discussed soft diet because patient does not have dentures, but he declined  Check vital signs daily and precautions every 16 mins Labs: CBC, Hgb AIC, lipid panel and TSH, patient is refusing labs---will reorder again in am  Disposition: back home with brother once stable.   Hildred Priest, MD 12/04/2016, 1:47 PM

## 2016-12-04 NOTE — Progress Notes (Signed)
Recreation Therapy Notes  Date: 07.02.18 Time: 9:30 am Location: Craft Room  Group Topic: Self-esteem  Goal Area(s) Addresses:  Patient will effectively use art as a means of self-expression. Patient will recognize positive benefit of self-expression. Patient will be able to identify one emotion experienced during group.  Behavioral Response: Did not attend   Intervention: Two Face of Me  Activity: Patients were given a blank face worksheet and were instructed to draw a line down the middle. On one side of the worksheet, patients were instructed to draw or write how they felt when they were admitted and on the other side, they were instructed to draw or write how they felt when they were discharged.  Education: LRT educated patients on different forms of self-expression.  Education Outcome: Patient did not attend group.  Clinical Observations/Feedback: Patient did not attend group.  Leonette Monarch, LRT/CTRS 12/04/2016 10:25 AM

## 2016-12-04 NOTE — Progress Notes (Signed)
Pt observed sleeping in late this morning with cover over his entire body and head, responds to voice, but would not engage in conversation with this nurse. Isolative. Once patient was awake and out of his room, he is observed in dayroom. Minimal interaction with staff/peers noted. Pt requested to see chaplain, spiritual consult ordered, chaplain visited. Pt calm and cooperative. Compliant with medications. No behavioral issues noted today. Ate meals today. Denies SI/HI/AVH. Does not appear to be responding to internal stimuli. Support and encouragement provided. Medications administered as ordered with education. Safety maintained with every 15 minute checks. Will continue to monitor.

## 2016-12-04 NOTE — Plan of Care (Signed)
Problem: Activity: Goal: Sleeping patterns will improve Outcome: Progressing It was reported that pt slept 5 hours 15 minutes last night. Observed sleeping late in the morning today.

## 2016-12-05 MED ORDER — FLUPHENAZINE HCL 2.5 MG/ML IJ SOLN
5.0000 mg | Freq: Every day | INTRAMUSCULAR | Status: DC
  Filled 2016-12-05: qty 2

## 2016-12-05 MED ORDER — FLUPHENAZINE HCL 5 MG PO TABS
5.0000 mg | ORAL_TABLET | Freq: Every day | ORAL | Status: DC
  Administered 2016-12-06 – 2016-12-07 (×2): 5 mg via ORAL
  Filled 2016-12-05 (×2): qty 1

## 2016-12-05 MED ORDER — FLUPHENAZINE HCL 5 MG PO TABS
10.0000 mg | ORAL_TABLET | Freq: Every day | ORAL | Status: DC
  Administered 2016-12-05 – 2016-12-06 (×2): 10 mg via ORAL
  Filled 2016-12-05 (×2): qty 2

## 2016-12-05 NOTE — Progress Notes (Addendum)
[facility] MD Progress Note  12/05/2016 12:01 PM Dustin [name]  MRN:  [id number] Subjective:    Dustin [name] is a 60 yo african-american male who presented to the ER under IVC from [place] on 11/28/16.  According to the IVC, the patient had not been taking medications and that the patient was stated he could hear demon voices, and was agitated and yelling at neighbors.  Pt was seen at [facility] for his does of [place], and staff from [place] reported that the patient had been getting it from [place], but was not able to get his last injection bc nobody was there to administer it.    [name] also reports pt is hypereligious and has been making sexually inappropriate statements to male staff.   While in the ER he was agitated and aggressive. He was very disorganized.  6/28 Today he refused to speak with me he told me he was busy. He refused breakfast and told the staff that he is in areligious fasting. He refused vital signs and medications. Per nurse's "Tangentially explains why he refuses meds, reporting he is Catholic, is "inflicted," and someone at his outpatient mental health provider told him he does not need to take meds."   6/29 patient has refused medications multiple times. Nonemergency medications have been ordered. The patient required a manual hold this morning in order to administer Prolixin. During assessment today he was very disorganized, hyperreligious but he was more cooperative and less argumentative than yesterday  7/2: pt seems to be improving.  He was talkative and a little more friendly than before.  He reports that he slept ok last night, but it took him a little longer than usual to fall asleep.  He is no longer fasting and went to lunch today.  Denies any depression, anxiety, or suicidal/homicidal ideations.  Speech is garble and hard to comprehend.  Patient says he is still fasting. Feels he is praying too much.  Unhappy about having to take meds, says he doesn't need them.    He wrote (scribble)all over the questioners sheets. He also wrote on 2 stere foam cups and his name tag on the room's door.  7/3 patient was uncooperative during assessment today. His speech was very difficult to understand but profanity was used constantly. Patient started crying, I was unable to understand why he was so upset. He asked to be left  the f... Alone.  He has been compliant with medications. He is slept 5.5 hours last night. She has been eating well. He states in his room all day lying in bed. No interaction with others,  no participation in group.  Per nursing: D: Pt denies SI/HI/AV, but noted on several occasions talking to self and responding to internal stimuli. Pt is pleasant and cooperative during this shift, he appears less irritable, not using profanities towards staff. Patient appears less anxious and he is interacting with peers  appropriately.  A: Pt was offered support and encouragement. Pt was given scheduled medications. Pt was encouraged to attend groups. Q 15 minute checks were done for safety.  R:Pt attends groups and interacts well with peers and staff. Pt is complaint with medication. Pt has no complaints.Pt receptive to treatment and safety maintained on unit.   Principal Problem: Undifferentiated schizophrenia ([place]) Diagnosis:   Patient Active Problem List   Diagnosis Date Noted  . Undifferentiated schizophrenia ([place]) [F20.3] 11/29/2016  . Tobacco use disorder [F17.200] 11/29/2016   Total Time spent with patient: 30 minutes  Past Medical History: History reviewed. No pertinent past medical history. History reviewed. No pertinent surgical history.  Family History: History reviewed. No pertinent family history.  Family Psychiatric  History: no pertinent family hx  Social History:  History  Alcohol use Not on file     History  Drug use: Unknown    Social History   Social History  . Marital status: Single    Spouse name: N/A  . Number of  children: N/A  . Years of education: N/A   Social History Main Topics  . Smoking status: Current Every Day Smoker  . Smokeless tobacco: Never Used  . Alcohol use None  . Drug use: Unknown  . Sexual activity: Not Asked   Other Topics Concern  . None   Social History Narrative  . None    Sleep: Good  Appetite:  Fair  Current Medications: Current Facility-Administered Medications  Medication Dose Route Frequency Provider Last Rate Last Dose  . acetaminophen (TYLENOL) tablet 650 mg  650 mg Oral Q6H PRN Pucilowska, Jolanta B, MD   650 mg at 12/03/16 0748  . alum & mag hydroxide-simeth (MAALOX/MYLANTA) 200-200-20 MG/5ML suspension 30 mL  30 mL Oral Q4H PRN Pucilowska, Jolanta B, MD      . diphenhydrAMINE (BENADRYL) capsule 25 mg  25 mg Oral QHS Hildred Priest, MD   25 mg at 12/04/16 2205  . fluPHENAZine (PROLIXIN) injection 5 mg  5 mg Intramuscular BID Hildred Priest, MD   5 mg at 12/02/16 0835   Or  . fluPHENAZine (PROLIXIN) tablet 5 mg  5 mg Oral BID Hildred Priest, MD   5 mg at 12/05/16 0752  . fluPHENAZine decanoate (PROLIXIN) injection 50 mg  50 mg Intramuscular Once [name], [name], MD      . LORazepam (ATIVAN) tablet 2 mg  2 mg Oral Q4H PRN Hildred Priest, MD       Or  . LORazepam (ATIVAN) injection 2 mg  2 mg Intramuscular Q4H PRN Hildred Priest, MD      . LORazepam (ATIVAN) tablet 1 mg  1 mg Oral QHS Hildred Priest, MD   1 mg at 12/04/16 2205  . magnesium hydroxide (MILK OF MAGNESIA) suspension 30 mL  30 mL Oral Daily PRN Pucilowska, Jolanta B, MD      . nicotine (NICODERM CQ - dosed in mg/24 hours) patch 21 mg  21 mg Transdermal Daily Hildred Priest, MD        Lab Results: No results found for this or any previous visit (from the past 48 hour(s)).  Blood Alcohol level:  Lab Results  Component Value Date   ETH <5 49/44/9675    Metabolic Disorder Labs: No results found for: HGBA1C,  MPG No results found for: PROLACTIN No results found for: CHOL, TRIG, HDL, CHOLHDL, VLDL, LDLCALC  Physical Findings: AIMS:  , ,  ,  ,    CIWA:    COWS:     Musculoskeletal: Strength & Muscle Tone: within normal limits Gait & Station: normal Patient leans: N/A  Psychiatric Specialty Exam: Physical Exam  Vitals reviewed. Constitutional: He is oriented to person, place, and time. He appears well-developed and well-nourished.  Respiratory: Effort normal and breath sounds normal.  Neurological: He is alert and oriented to person, place, and time.  Skin: Skin is warm and dry.  Psychiatric: His mood appears not anxious. He is not agitated. Cognition and memory are impaired. He does not exhibit a depressed mood. He expresses no homicidal and no suicidal ideation.  Review of Systems  Unable to perform ROS: Acuity of condition    Blood pressure 100/70, pulse (!) 127, temperature 98.6 F (37 C), temperature source Oral, resp. rate 18, height 6\' 2"  (1.88 m), weight 70.3 kg (155 lb), SpO2 100 %.Body mass index is 19.9 kg/m.  General Appearance: Disheveled  Eye Contact:  Fair  Speech:  Garbled  Volume:  Normal  Mood:  Euthymic  Affect:  Appropriate  Thought Process:  Disorganized and Descriptions of Associations: Loose  Orientation:  Full (Time, Place, and Person)  Thought Content:  Delusions  Suicidal Thoughts:  No  Homicidal Thoughts:  No  Memory:  Immediate;   Poor Recent;   Poor Remote;   Poor  Judgement:  Poor  Insight:  Lacking  Psychomotor Activity:  Normal  Concentration:  Concentration: Poor and Attention Span: Poor  Recall:  Poor  Fund of Knowledge:  Poor  Language:  Poor  Akathisia:  No  Handed:   AIMS (if indicated):     Assets:  Armed forces logistics/support/administrative officer Physical Health  ADL's:  Intact  Cognition:  Impaired,  Mild  Sleep:  Number of Hours: 5.5     Treatment Plan Summary:  Pt is a 61 yo male with diagnosis of schizophrenia.  Pt is refusing to take  medications and an order for nonemergency forced medications with prolixin IM was placed.    Schizoprenia: stable,  Confirmed with [facility] that pt was on prolixin deconate.  -Pt has been started on prolixin oral.  Dose will be increased today from 5 mg po bid to 5 mg po q am and 10 mg po qhs  Pt is on non emergency forced medications. He is to receive prolixin IM if he refuses oral.  No restraints since last week. He continues to be on nonemergency forced medications.  Mood is labile, still delusional and hyper religious   Agitation: continue orders for ativan 2 mg q4hr prn po or im. Patient did not have any episodes of agitation with the weekend  Insomnia: improving: continue benadryl 25 mg and ativan 1 mg po qhs--slept 5 h  Tobacco use disorder: Continue nicotine patch  Diet: regular, discussed soft diet because patient does not have dentures, but he declined  Labs: refuses labs this am. Don't have lipid panel or HbA1c  Disposition: back home with brother once stable.   Hildred Priest, MD 12/05/2016, 12:01 PM

## 2016-12-05 NOTE — Progress Notes (Signed)
Recreation Therapy Notes  Date: 07.03.18 Time: 9:30 am Location: Craft Room  Group Topic: Goal Setting  Goal Area(s) Addresses:  Patient will identify at least one goal. Patient will identify at least one obstacle.  Behavioral Response: Did not attend  Intervention: Recovery Goal Chart  Activity: Patients were instructed to make a Recovery Goal Chart including goals, obstacles, the date they started working on their goals, and the date they achieved their goals.  Education: LRT educated patients on healthy ways to celebrate achieving their goals.  Education Outcome: Patient did not attend group.   Clinical Observations/Feedback: Patient did not attend group.  Leonette Monarch, LRT/CTRS 12/05/2016 10:25 AM

## 2016-12-05 NOTE — BHH Group Notes (Signed)
  [facility] LCSW Group Therapy Note  Date/Time:12/05/16  Type of Therapy/Topic:  Group Therapy:  Feelings about Diagnosis  Participation Level:  Did Not Attend    August Saucer, LCSW 12/05/2016, 4:59 PM

## 2016-12-05 NOTE — BHH Group Notes (Signed)
Goals Group  Date/Time: 12/05/2016, 9:00 AM Type of Therapy and Topic: Group Therapy: Goals Group: SMART Goals  ?  Participation Level: Did not attend  ?  Description of Group:  ?  The purpose of a daily goals group is to assist and guide patients in setting recovery/wellness-related goals. The objective is to set goals as they relate to the crisis in which they were admitted. Patients will be using SMART goal modalities to set measurable goals. Characteristics of realistic goals will be discussed and patients will be assisted in setting and processing how one will reach their goal. Facilitator will also assist patients in applying interventions and coping skills learned in psycho-education groups to the SMART goal and process how one will achieve defined goal.  ?  Therapeutic Goals:  ?  -Patients will develop and document one goal related to or their crisis in which brought them into treatment.  -Patients will be guided by LCSW using SMART goal setting modality in how to set a measurable, attainable, realistic and time sensitive goal.  -Patients will process barriers in reaching goal.  -Patients will process interventions in how to overcome and successful in reaching goal.  ?  Patient's Goal: Did not attend  ?  Therapeutic Modalities:  Motivational Interviewing  Cognitive Behavioral Therapy  Crisis Intervention Model  SMART goals setting  Glorious Peach, MSW, LCSW-A 12/05/2016, 11:41AM

## 2016-12-05 NOTE — Progress Notes (Signed)
Patient was visible on the unit pacing the hallways and sitting in dayroom watching TV. Patient can be flirtatious at times and has to be redirected, but complies with redirection. Patient is compliant with medication, even though he will state first "I don't need any medicine".Patient is alert and oriented x 4, breathing unlabored, and extremities x 4 within normal limits. Patient did not display any disruptive behavior. Patient denies any SI/HI/AH/VH. Patient continues to be monitored on 15 minute safety checks. Will continue to monitor patient and notify MD of any changes.

## 2016-12-05 NOTE — Progress Notes (Signed)
D: Pt denies SI/HI/AV, but noted on several occasions talking to self and responding to internal stimuli. Pt is pleasant and cooperative during this shift, he appears less irritable, not using profanities towards staff. Patient appears less anxious and he is interacting with peers  appropriately.  A: Pt was offered support and encouragement. Pt was given scheduled medications. Pt was encouraged to attend groups. Q 15 minute checks were done for safety.  R:Pt attends groups and interacts well with peers and staff. Pt is complaint with medication. Pt has no complaints.Pt receptive to treatment and safety maintained on unit.

## 2016-12-05 NOTE — Plan of Care (Signed)
Problem: Safety: Goal: Periods of time without injury will increase Outcome: Progressing Patient remains safe and without injury during hospitalization and on Q 15 minute monitoring. Will continue to monitor patient.

## 2016-12-06 MED ORDER — LORAZEPAM 0.5 MG PO TABS
0.5000 mg | ORAL_TABLET | Freq: Every day | ORAL | Status: DC
  Administered 2016-12-06: 0.5 mg via ORAL
  Filled 2016-12-06: qty 1

## 2016-12-06 MED ORDER — FLUPHENAZINE DECANOATE 25 MG/ML IJ SOLN
25.0000 mg | INTRAMUSCULAR | Status: DC
Start: 2016-12-06 — End: 2016-12-13
  Administered 2016-12-06: 25 mg via INTRAMUSCULAR
  Filled 2016-12-06: qty 1

## 2016-12-06 NOTE — BHH Group Notes (Signed)
[place] Group Notes:  (Nursing/MHT/Case Management/Adjunct)  Date:  12/06/2016  Time:  11:24 PM  Type of Therapy:  Psychoeducational Skills  Participation Level:  Active  Participation Quality:  Monopolizing and Sharing  Affect:  Appropriate  Cognitive:  Disorganized and Lacking  Insight:  Limited  Engagement in Group:  Monopolizing and Off Topic  Modes of Intervention:  Clarification and Discussion  Summary of Progress/Problems:  Kathi Ludwig 12/06/2016, 11:24 PM

## 2016-12-06 NOTE — Tx Team (Signed)
Interdisciplinary Treatment and Diagnostic Plan Update  12/06/2016 Time of Session: 11:00 AM Dustin [name] MRN: [id number]  Principal Diagnosis: Undifferentiated schizophrenia [facility])  Secondary Diagnoses: Principal Problem:   Undifferentiated schizophrenia ([place]) Active Problems:   Tobacco use disorder   Current Medications:  Current Facility-Administered Medications  Medication Dose Route Frequency Provider Last Rate Last Dose  . acetaminophen (TYLENOL) tablet 650 mg  650 mg Oral Q6H PRN Pucilowska, Jolanta B, MD   650 mg at 12/03/16 0748  . alum & mag hydroxide-simeth (MAALOX/MYLANTA) 200-200-20 MG/5ML suspension 30 mL  30 mL Oral Q4H PRN Pucilowska, Jolanta B, MD      . diphenhydrAMINE (BENADRYL) capsule 25 mg  25 mg Oral QHS Hildred Priest, MD   25 mg at 12/05/16 2221  . fluPHENAZine (PROLIXIN) injection 5 mg  5 mg Intramuscular Daily Hildred Priest, MD       Or  . fluPHENAZine (PROLIXIN) tablet 5 mg  5 mg Oral Daily Hildred Priest, MD   5 mg at 12/06/16 0849  . fluPHENAZine (PROLIXIN) injection 5 mg  5 mg Intramuscular QHS Hildred Priest, MD       Or  . fluPHENAZine (PROLIXIN) tablet 10 mg  10 mg Oral QHS Hildred Priest, MD   10 mg at 12/05/16 2221  . LORazepam (ATIVAN) tablet 2 mg  2 mg Oral Q4H PRN Hildred Priest, MD       Or  . LORazepam (ATIVAN) injection 2 mg  2 mg Intramuscular Q4H PRN Hildred Priest, MD      . LORazepam (ATIVAN) tablet 0.5 mg  0.5 mg Oral QHS Hildred Priest, MD      . magnesium hydroxide (MILK OF MAGNESIA) suspension 30 mL  30 mL Oral Daily PRN Pucilowska, Jolanta B, MD      . nicotine (NICODERM CQ - dosed in mg/24 hours) patch 21 mg  21 mg Transdermal Daily Hildred Priest, MD       PTA Medications: Prescriptions Prior to Admission  Medication Sig Dispense Refill Last Dose  . acetaminophen (TYLENOL) 500 MG tablet Take 500 mg by mouth daily as needed  for mild pain or moderate pain.    PRN at PRN  . fluPHENAZine (PROLIXIN) 2.5 MG/ML injection Inject 2 mg into the muscle every 30 (thirty) days. 2 mg = 0.8 ML   Past Month at Unknown time  . Multiple Vitamin (MULTIVITAMIN) capsule Take 1 capsule by mouth daily.    Past Month at Unknown time    Patient Stressors: Educational concerns Medication change or noncompliance  Patient Strengths: Ability for insight Active sense of humor Capable of independent living Motivation for treatment/growth Religious Affiliation Supportive family/friends  Treatment Modalities: Medication Management, Group therapy, Case management,  1 to 1 session with clinician, Psychoeducation, Recreational therapy.   Physician Treatment Plan for Primary Diagnosis: Undifferentiated schizophrenia ([place]) Long Term Goal(s): Improvement in symptoms so as ready for discharge Improvement in symptoms so as ready for discharge   Short Term Goals: Ability to demonstrate self-control will improve Compliance with prescribed medications will improve Ability to identify changes in lifestyle to reduce recurrence of condition will improve Ability to identify and develop effective coping behaviors will improve  Medication Management: Evaluate patient's response, side effects, and tolerance of medication regimen.  Therapeutic Interventions: 1 to 1 sessions, Unit Group sessions and Medication administration.  Evaluation of Outcomes: Progressing   Physician Treatment Plan for Secondary Diagnosis: Principal Problem:   Undifferentiated schizophrenia ([place]) Active Problems:   Tobacco use disorder  Long Term Goal(s):  Improvement in symptoms so as ready for discharge Improvement in symptoms so as ready for discharge   Short Term Goals: Ability to demonstrate self-control will improve Compliance with prescribed medications will improve Ability to identify changes in lifestyle to reduce recurrence of condition will improve Ability to  identify and develop effective coping behaviors will improve     Medication Management: Evaluate patient's response, side effects, and tolerance of medication regimen.  Therapeutic Interventions: 1 to 1 sessions, Unit Group sessions and Medication administration.  Evaluation of Outcomes:  Progressing   RN Treatment Plan for Primary Diagnosis: Undifferentiated schizophrenia ([place]) Long Term Goal(s): Knowledge of disease and therapeutic regimen to maintain health will improve  Short Term Goals: Ability to demonstrate self-control, Ability to verbalize feelings will improve and Compliance with prescribed medications will improve  Medication Management: RN will administer medications as ordered by provider, will assess and evaluate patient's response and provide education to patient for prescribed medication. RN will report any adverse and/or side effects to prescribing provider.  Therapeutic Interventions: 1 on 1 counseling sessions, Psychoeducation, Medication administration, Evaluate responses to treatment, Monitor vital signs and CBGs as ordered, Perform/monitor CIWA, COWS, AIMS and Fall Risk screenings as ordered, Perform wound care treatments as ordered.  Evaluation of Outcomes: Progressing    LCSW Treatment Plan for Primary Diagnosis: Undifferentiated schizophrenia ([place]) Long Term Goal(s): Safe transition to appropriate next level of care at discharge, Engage patient in therapeutic group addressing interpersonal concerns.  Short Term Goals: Engage patient in aftercare planning with referrals and resources, Increase social support, Increase emotional regulation and Increase skills for wellness and recovery  Therapeutic Interventions: Assess for all discharge needs, 1 to 1 time with Social worker, Explore available resources and support systems, Assess for adequacy in community support network, Educate family and significant other(s) on suicide prevention, Complete Psychosocial Assessment,  Interpersonal group therapy.  Evaluation of Outcomes: Progressing    Progress in Treatment: Attending groups: Yes. Participating in groups: No. Taking medication as prescribed: Yes. Toleration medication: Yes. Family/Significant other contact made: Yes, individual(s) contacted:  CSW spoke with patient's brother. Patient understands diagnosis: Yes. Discussing patient identified problems/goals with staff: Yes. Medical problems stabilized or resolved: Yes. Denies suicidal/homicidal ideation: Yes. Issues/concerns per patient self-inventory: No.  New problem(s) identified: No, Describe:  None.  New Short Term/Long Term Goal(s): Patient unable to state goal at this time.  Discharge Plan or Barriers: CSW assessing appropriate discharge plan.  Reason for Continuation of Hospitalization: Aggression Delusions  Hallucinations  Estimated Length of Stay: 7 days   Attendees: Patient: Dustin [name] 12/06/2016 11:47 AM  Physician: Dr. Merlyn Albert, MD  12/06/2016 11:47 AM  Nursing: Polly Cobia, RN  12/06/2016 11:47 AM  RN Care Manager: 12/06/2016 11:47 AM  Social Worker: Glorious Peach, MSW, LCSW-A 12/06/2016 11:47 AM  Recreational Therapist: Drue Flirt, [place], [place]  12/06/2016 11:47 AM  Other: Pasty Spillers, PA Student  12/06/2016 11:47 AM  Other:  12/06/2016 11:47 AM  Other: 12/06/2016 11:47 AM    Scribe for Treatment Team: Emilie Rutter, [place] 12/06/2016 11:47 AM

## 2016-12-06 NOTE — Plan of Care (Signed)
Problem: Coping: Goal: Ability to verbalize feelings will improve Outcome: Not Progressing Patient experiencing crying spells not able to verbalize the reason for the sadness. When asked what was wrong with him, patient stated, "I don't know why I'm crying."

## 2016-12-06 NOTE — Progress Notes (Signed)
DAR Note: Pt at the time of assessment denied any anxiety, depression, pain, SI, HI or AVH; state, "I hear angles talking to me, but they will say I am hallucinating; I know when I hear the voice of an angel telling me that everything is going to be alright." Pt observed interacting with peers in the dayroom. Pt remained calm and cooperative. Medications offered as prescribed. All patient's questions and concerns addressed. Support, encouragement, and safe environment provided. 15-minute safety checks continue. Pt was med compliant. Safety checks continue.

## 2016-12-06 NOTE — BHH Group Notes (Signed)
[place] Group Notes:  (Nursing/MHT/Case Management/Adjunct)  Date:  12/06/2016  Time:  7:10 AM  Type of Therapy:  Psychoeducational Skills  Participation Level:  Active  Participation Quality:  Appropriate and Attentive  Affect:  Appropriate  Cognitive:  Appropriate  Insight:  Appropriate  Engagement in Group:  Engaged  Modes of Intervention:  Discussion, Socialization and Support  Summary of Progress/Problems:  Dustin [name] 12/06/2016, 7:10 AM

## 2016-12-06 NOTE — Progress Notes (Signed)
Patient alert and responsive. Denies SI, but states, "The angels are coming to help me, they say that God is good to me."  Patient lying in bed crying intermittently this morning, when this writer asked what was causing him to cry patient stated, "I don't know, I'm just crying."  Patient observed in the dayroom watching TV and interacting with select peers. Compliant with meals and meds. Support, encouragement and safe environment provided with q 15 minute safety checks.

## 2016-12-06 NOTE — Progress Notes (Addendum)
[facility] MD Progress Note  12/06/2016 11:51 AM Dustin [name]  MRN:  [id number] Subjective:    Dustin [name] is a 61 yo african-american male who presented to the ER under IVC from [place] on 11/28/16.  According to the IVC, the patient had not been taking medications and that the patient was stated he could hear demon voices, and was agitated and yelling at neighbors.  Pt was seen at [facility] for his does of Invega, and staff from [place] reported that the patient had been getting it from [place], but was not able to get his last injection bc nobody was there to administer it.    [name] also reports pt is hypereligious and has been making sexually inappropriate statements to male staff.   While in the ER he was agitated and aggressive. He was very disorganized.  6/28 Today he refused to speak with me he told me he was busy. He refused breakfast and told the staff that he is in areligious fasting. He refused vital signs and medications. Per nurse's "Tangentially explains why he refuses meds, reporting he is Catholic, is "inflicted," and someone at his outpatient mental health provider told him he does not need to take meds."   6/29 patient has refused medications multiple times. Nonemergency medications have been ordered. The patient required a manual hold this morning in order to administer Prolixin. During assessment today he was very disorganized, hyperreligious but he was more cooperative and less argumentative than yesterday  7/2: pt seems to be improving.  He was talkative and a little more friendly than before.  He reports that he slept ok last night, but it took him a little longer than usual to fall asleep.  He is no longer fasting and went to lunch today.  Denies any depression, anxiety, or suicidal/homicidal ideations.  Speech is garble and hard to comprehend.  Patient says he is still fasting. Feels he is praying too much.  Unhappy about having to take meds, says he doesn't need them.    He wrote (scribble)all over the questioners sheets. He also wrote on 2 stere foam cups and his name tag on the room's door.  7/3 patient was uncooperative during assessment today. His speech was very difficult to understand but profanity was used constantly. Patient started crying, I was unable to understand why he was so upset. He asked to be left  the f... Alone.  He has been compliant with medications. He is slept 5.5 hours last night. She has been eating well. He states in his room all day lying in bed. No interaction with others,  no participation in group.  7/4 patient has more cooperative this morning. His speech is very difficult to understand. He talks about random  and very tangential things. Continues to be hyperreligious. Per staff patient has been sexually inappropriate making inappropriate gestures with his mouth to some of the male staff.  Today he is open to discuss administration of Prolixin decanoate.  Per nursing: Patient experiencing crying spells not able to verbalize the reason for the sadness. When asked what was wrong with him, patient stated, "I don't know why I'm crying."  Patient alert and responsive. Denies SI, but states, "The angels are coming to help me, they say that God is good to me."  Patient lying in bed crying intermittently this morning, when this writer asked what was causing him to cry patient stated, "I don't know, I'm just crying."  Patient observed in the dayroom watching TV  and interacting with select peers. Compliant with meals and meds. Support, encouragement and safe environment provided with q 15 minute safety checks.  Principal Problem: Undifferentiated schizophrenia ([place]) Diagnosis:   Patient Active Problem List   Diagnosis Date Noted  . Undifferentiated schizophrenia ([place]) [F20.3] 11/29/2016  . Tobacco use disorder [F17.200] 11/29/2016   Total Time spent with patient: 30 minutes    Past Medical History: History reviewed. No pertinent  past medical history. History reviewed. No pertinent surgical history.  Family History: History reviewed. No pertinent family history.  Family Psychiatric  History: no pertinent family hx  Social History:  History  Alcohol use Not on file     History  Drug use: Unknown    Social History   Social History  . Marital status: Single    Spouse name: N/A  . Number of children: N/A  . Years of education: N/A   Social History Main Topics  . Smoking status: Current Every Day Smoker  . Smokeless tobacco: Never Used  . Alcohol use None  . Drug use: Unknown  . Sexual activity: Not Asked   Other Topics Concern  . None   Social History Narrative  . None    Sleep: Good  Appetite:  Fair  Current Medications: Current Facility-Administered Medications  Medication Dose Route Frequency Provider Last Rate Last Dose  . acetaminophen (TYLENOL) tablet 650 mg  650 mg Oral Q6H PRN Pucilowska, Jolanta B, MD   650 mg at 12/03/16 0748  . alum & mag hydroxide-simeth (MAALOX/MYLANTA) 200-200-20 MG/5ML suspension 30 mL  30 mL Oral Q4H PRN Pucilowska, Jolanta B, MD      . diphenhydrAMINE (BENADRYL) capsule 25 mg  25 mg Oral QHS Hildred Priest, MD   25 mg at 12/05/16 2221  . fluPHENAZine (PROLIXIN) injection 5 mg  5 mg Intramuscular Daily Hildred Priest, MD       Or  . fluPHENAZine (PROLIXIN) tablet 5 mg  5 mg Oral Daily Hildred Priest, MD   5 mg at 12/06/16 0849  . fluPHENAZine (PROLIXIN) injection 5 mg  5 mg Intramuscular QHS Hildred Priest, MD       Or  . fluPHENAZine (PROLIXIN) tablet 10 mg  10 mg Oral QHS Hildred Priest, MD   10 mg at 12/05/16 2221  . fluPHENAZine decanoate (PROLIXIN) injection 25 mg  25 mg Intramuscular Q21 days Hildred Priest, MD      . LORazepam (ATIVAN) tablet 2 mg  2 mg Oral Q4H PRN Hildred Priest, MD       Or  . LORazepam (ATIVAN) injection 2 mg  2 mg Intramuscular Q4H PRN  Hildred Priest, MD      . LORazepam (ATIVAN) tablet 0.5 mg  0.5 mg Oral QHS Hildred Priest, MD      . magnesium hydroxide (MILK OF MAGNESIA) suspension 30 mL  30 mL Oral Daily PRN Pucilowska, Jolanta B, MD      . nicotine (NICODERM CQ - dosed in mg/24 hours) patch 21 mg  21 mg Transdermal Daily Hildred Priest, MD        Lab Results: No results found for this or any previous visit (from the past 48 hour(s)).  Blood Alcohol level:  Lab Results  Component Value Date   ETH <5 16/03/9603    Metabolic Disorder Labs: No results found for: HGBA1C, MPG No results found for: PROLACTIN No results found for: CHOL, TRIG, HDL, CHOLHDL, VLDL, LDLCALC  Physical Findings: AIMS:  , ,  ,  ,    CIWA:  COWS:     Musculoskeletal: Strength & Muscle Tone: within normal limits Gait & Station: normal Patient leans: N/A  Psychiatric Specialty Exam: Physical Exam  Vitals reviewed. Constitutional: He is oriented to person, place, and time. He appears well-developed and well-nourished.  Respiratory: Effort normal and breath sounds normal.  Neurological: He is alert and oriented to person, place, and time.  Skin: Skin is warm and dry.  Psychiatric: His mood appears not anxious. He is not agitated. Cognition and memory are impaired. He does not exhibit a depressed mood. He expresses no homicidal and no suicidal ideation.    Review of Systems  Unable to perform ROS: Acuity of condition    Blood pressure 112/85, pulse 100, temperature 98.6 F (37 C), temperature source Oral, resp. rate 18, height 6\' 2"  (1.88 m), weight 70.3 kg (155 lb), SpO2 100 %.Body mass index is 19.9 kg/m.  General Appearance: Disheveled  Eye Contact:  Fair  Speech:  Garbled  Volume:  Normal  Mood:  Euthymic  Affect:  Appropriate  Thought Process:  Disorganized and Descriptions of Associations: Loose  Orientation:  Full (Time, Place, and Person)  Thought Content:  Delusions  Suicidal  Thoughts:  No  Homicidal Thoughts:  No  Memory:  Immediate;   Poor Recent;   Poor Remote;   Poor  Judgement:  Poor  Insight:  Lacking  Psychomotor Activity:  Normal  Concentration:  Concentration: Poor and Attention Span: Poor  Recall:  Poor  Fund of Knowledge:  Poor  Language:  Poor  Akathisia:  No  Handed:   AIMS (if indicated):     Assets:  Armed forces logistics/support/administrative officer Physical Health  ADL's:  Intact  Cognition:  Impaired,  Mild  Sleep:  Number of Hours: 7     Treatment Plan Summary:  Pt is a 61 yo male with diagnosis of schizophrenia.  Pt is refusing to take medications and an order for nonemergency forced medications with prolixin IM was placed.    Schizoprenia: stable,  Confirmed with [facility] that pt was on prolixin deconate.  -Pt has been started on prolixin oral.  Continue Prolixin 5 mg in the morning and 10 mg in the evening  I will order Prolixin decanoate 25 mg every 21 days  Pt is on non emergency forced medications. He is to receive prolixin IM if he refuses oral.  No restraints since last week. He continues to be on nonemergency forced medications.  Mood is labile, still delusional and hyper religious   Agitation: continue orders for ativan 2 mg q4hr prn po or im. Patient did not have any episodes of agitation with the weekend  Insomnia: improving: continue benadryl 25 mg and ativan. We'll decrease Ativan to 0.5 mg by mouth daily at bedtime.  Orthostatic hypotension and tachycardia: Orders given for nurses to push fluids today  Tobacco use disorder: Continue nicotine patch  Diet: regular, discussed soft diet because patient does not have dentures, but he declined  Labs: refuses labs this am. Don't have lipid panel or HbA1c  Disposition: back home with brother once stable.   Possible discharge next week  Hildred Priest, MD 12/06/2016, 11:51 AM

## 2016-12-07 MED ORDER — ZOLPIDEM TARTRATE 5 MG PO TABS
10.0000 mg | ORAL_TABLET | Freq: Every day | ORAL | Status: DC
  Administered 2016-12-07 – 2016-12-12 (×6): 10 mg via ORAL
  Filled 2016-12-07 (×6): qty 2

## 2016-12-07 MED ORDER — FLUPHENAZINE HCL 5 MG PO TABS
10.0000 mg | ORAL_TABLET | Freq: Every day | ORAL | Status: DC
  Administered 2016-12-07: 10 mg via ORAL
  Filled 2016-12-07: qty 2

## 2016-12-07 MED ORDER — FLUPHENAZINE HCL 5 MG PO TABS
5.0000 mg | ORAL_TABLET | Freq: Every day | ORAL | Status: DC
  Administered 2016-12-08 – 2016-12-13 (×6): 5 mg via ORAL
  Filled 2016-12-07 (×6): qty 1

## 2016-12-07 NOTE — Progress Notes (Signed)
Recreation Therapy Notes  Date: 07.05.18 Time: 9:30 am Location: Craft Room  Group Topic: Leisure Education  Goal Area(s) Addresses:  Patient will identify things they are grateful for. Patient will identify how being grateful can influence decision making.  Behavioral Response: Attentive  Intervention: Immunologist  Activity: Patients were given an I Am Grateful For worksheet and were instructed to write things they are grateful for under each category.  Education: LRT educated patients on leisure.  Education Outcome: In group clarification offered   Clinical Observations/Feedback: Patient worked on Radio producer. Patient did not contribute to group discussion.  Leonette Monarch, LRT/CTRS 12/07/2016 10:19 AM

## 2016-12-07 NOTE — BHH Group Notes (Signed)
[facility] LCSW Group Therapy   12/07/2016 1pm   Type of Therapy: Group Therapy   Participation Level: Pt invited but did not attend.    Glorious Peach, MSW, LCSW-A 12/04/2016, 3:55PM

## 2016-12-07 NOTE — BHH Group Notes (Signed)
[facility] LCSW Group Therapy Note  Type of Therapy and Topic:  Group Therapy:  Goals Group: SMART Goals  Participation Level:  Patient did not attend group. CSW invited patient to group.   Description of Group:   The purpose of a daily goals group is to assist and guide patients in setting recovery/wellness-related goals.  The objective is to set goals as they relate to the crisis in which they were admitted. Patients will be using SMART goal modalities to set measurable goals.  Characteristics of realistic goals will be discussed and patients will be assisted in setting and processing how one will reach their goal. Facilitator will also assist patients in applying interventions and coping skills learned in psycho-education groups to the SMART goal and process how one will achieve defined goal.  Therapeutic Goals: -Patients will develop and document one goal related to or their crisis in which brought them into treatment. -Patients will be guided by LCSW using SMART goal setting modality in how to set a measurable, attainable, realistic and time sensitive goal.  -Patients will process barriers in reaching goal. -Patients will process interventions in how to overcome and successful in reaching goal.   Summary of Patient Progress:  Patient Goal: None identified at this time.    Therapeutic Modalities:   Motivational Interviewing Public relations account executive Therapy Crisis Intervention Model SMART goals setting  Zandyr Barnhill G. Claybon Jabs MSW, [place] 12/07/2016 2:04 PM

## 2016-12-07 NOTE — Progress Notes (Signed)
Patient ID: Dustin [name], male   DOB: April 10, 1956, 61 y.o.   MRN: [id number] Visible in the day room, interacting with peers, mood and affect is improving, still disorganized, still hype religious, there is no scheduled bedtime medications; will continue to provide support.

## 2016-12-07 NOTE — Progress Notes (Signed)
[facility] MD Progress Note  12/07/2016 12:01 PM Dustin [name]  MRN:  [id number] Subjective:    Dustin [name] is a 61 yo african-american male who presented to the ER under IVC from [place] on 11/28/16.  According to the IVC, the patient had not been taking medications and that the patient was stated he could hear demon voices, and was agitated and yelling at neighbors.  Pt was seen at [facility] for his does of Invega, and staff from [place] reported that the patient had been getting it from [place], but was not able to get his last injection bc nobody was there to administer it.    [name] also reports pt is hypereligious and has been making sexually inappropriate statements to male staff.   While in the ER he was agitated and aggressive. He was very disorganized.  6/28 Today he refused to speak with me he told me he was busy. He refused breakfast and told the staff that he is in areligious fasting. He refused vital signs and medications. Per nurse's "Tangentially explains why he refuses meds, reporting he is Catholic, is "inflicted," and someone at his outpatient mental health provider told him he does not need to take meds."   6/29 patient has refused medications multiple times. Nonemergency medications have been ordered. The patient required a manual hold this morning in order to administer Prolixin. During assessment today he was very disorganized, hyperreligious but he was more cooperative and less argumentative than yesterday  7/2: pt seems to be improving.  He was talkative and a little more friendly than before.  He reports that he slept ok last night, but it took him a little longer than usual to fall asleep.  He is no longer fasting and went to lunch today.  Denies any depression, anxiety, or suicidal/homicidal ideations.  Speech is garble and hard to comprehend.  Patient says he is still fasting. Feels he is praying too much.  Unhappy about having to take meds, says he doesn't need them.    He wrote (scribble)all over the questioners sheets. He also wrote on 2 stere foam cups and his name tag on the room's door.  7/3 patient was uncooperative during assessment today. His speech was very difficult to understand but profanity was used constantly. Patient started crying, I was unable to understand why he was so upset. He asked to be left  the f... Alone.  He has been compliant with medications. He is slept 5.5 hours last night. She has been eating well. He states in his room all day lying in bed. No interaction with others,  no participation in group.  7/4 patient has more cooperative this morning. His speech is very difficult to understand. He talks about random  and very tangential things. Continues to be hyperreligious. Per staff patient has been sexually inappropriate making inappropriate gestures with his mouth to some of the male staff.  Today he is open to discuss administration of Prolixin decanoate.  7/5 patient appears much improved. He received Prolixin Decanoate yesterday. It is still very hard for me to understand what he is saying as his speech is garbled. Still appears that his thought process is quite disorganized as he was talking about random things. His mood continues to be labile and once in a while he'll start crying for no reason. He is leaving his room more often and has been compliant with all medications. He denies side effects, physical complaints, suicidality or homicidality. Denies hallucinations.  Per nursing:  Patient up ad lib ambulating the unit. Patient presented to the medication room with his armband in his hand. This Probation officer told patient that his armband would be replaced but before that could be done patient immediately became verbally aggressive stating, "Look at the damn name lady, do you know how to spell it!?" Patient refused to remove himself from the med room stating, "I need it now lady." Patient's armband replaced. Observed in the dayroom with  selective per interaction. Patient remains safe with q 15 minute safety checks.  Principal Problem: Undifferentiated schizophrenia ([place]) Diagnosis:   Patient Active Problem List   Diagnosis Date Noted  . Undifferentiated schizophrenia ([place]) [F20.3] 11/29/2016  . Tobacco use disorder [F17.200] 11/29/2016   Total Time spent with patient: 30 minutes    Past Medical History: History reviewed. No pertinent past medical history. History reviewed. No pertinent surgical history.  Family History: History reviewed. No pertinent family history.  Family Psychiatric  History: no pertinent family hx  Social History:  History  Alcohol use Not on file     History  Drug use: Unknown    Social History   Social History  . Marital status: Single    Spouse name: N/A  . Number of children: N/A  . Years of education: N/A   Social History Main Topics  . Smoking status: Current Every Day Smoker  . Smokeless tobacco: Never Used  . Alcohol use None  . Drug use: Unknown  . Sexual activity: Not Asked   Other Topics Concern  . None   Social History Narrative  . None    Sleep: Good  Appetite:  Fair  Current Medications: Current Facility-Administered Medications  Medication Dose Route Frequency Provider Last Rate Last Dose  . acetaminophen (TYLENOL) tablet 650 mg  650 mg Oral Q6H PRN Pucilowska, Jolanta B, MD   650 mg at 12/03/16 0748  . alum & mag hydroxide-simeth (MAALOX/MYLANTA) 200-200-20 MG/5ML suspension 30 mL  30 mL Oral Q4H PRN Pucilowska, Jolanta B, MD      . diphenhydrAMINE (BENADRYL) capsule 25 mg  25 mg Oral QHS Hildred Priest, MD   25 mg at 12/06/16 2034  . fluPHENAZine (PROLIXIN) injection 5 mg  5 mg Intramuscular Daily Hildred Priest, MD       Or  . fluPHENAZine (PROLIXIN) tablet 5 mg  5 mg Oral Daily Hildred Priest, MD   5 mg at 12/07/16 0752  . fluPHENAZine (PROLIXIN) injection 5 mg  5 mg Intramuscular QHS Hildred Priest,  MD       Or  . fluPHENAZine (PROLIXIN) tablet 10 mg  10 mg Oral QHS Hildred Priest, MD   10 mg at 12/06/16 2033  . fluPHENAZine decanoate (PROLIXIN) injection 25 mg  25 mg Intramuscular Q21 days Hildred Priest, MD   25 mg at 12/06/16 1213  . LORazepam (ATIVAN) tablet 2 mg  2 mg Oral Q4H PRN Hildred Priest, MD       Or  . LORazepam (ATIVAN) injection 2 mg  2 mg Intramuscular Q4H PRN Hildred Priest, MD      . LORazepam (ATIVAN) tablet 0.5 mg  0.5 mg Oral QHS Hildred Priest, MD   0.5 mg at 12/06/16 2034  . magnesium hydroxide (MILK OF MAGNESIA) suspension 30 mL  30 mL Oral Daily PRN Pucilowska, Jolanta B, MD   30 mL at 12/06/16 2033  . nicotine (NICODERM CQ - dosed in mg/24 hours) patch 21 mg  21 mg Transdermal Daily Hildred Priest, MD  Lab Results: No results found for this or any previous visit (from the past 48 hour(s)).  Blood Alcohol level:  Lab Results  Component Value Date   ETH <5 66/11/3014    Metabolic Disorder Labs: No results found for: HGBA1C, MPG No results found for: PROLACTIN No results found for: CHOL, TRIG, HDL, CHOLHDL, VLDL, LDLCALC  Physical Findings: AIMS:  , ,  ,  ,    CIWA:    COWS:     Musculoskeletal: Strength & Muscle Tone: within normal limits Gait & Station: normal Patient leans: N/A  Psychiatric Specialty Exam: Physical Exam  Vitals reviewed. Constitutional: He is oriented to person, place, and time. He appears well-developed and well-nourished.  Respiratory: Effort normal and breath sounds normal.  Neurological: He is alert and oriented to person, place, and time.  Skin: Skin is warm and dry.  Psychiatric: His mood appears not anxious. He is not agitated. Cognition and memory are impaired. He does not exhibit a depressed mood. He expresses no homicidal and no suicidal ideation.    Review of Systems  Constitutional: Negative.   HENT: Negative.   Eyes: Negative.    Respiratory: Negative.   Cardiovascular: Negative.   Gastrointestinal: Negative.   Genitourinary: Negative.   Musculoskeletal: Negative.   Skin: Negative.   Neurological: Negative.   Endo/Heme/Allergies: Negative.   Psychiatric/Behavioral: Negative.     Blood pressure 99/71, pulse (!) 101, temperature 97.8 F (36.6 C), resp. rate 18, height 6\' 2"  (1.88 m), weight 70.3 kg (155 lb), SpO2 100 %.Body mass index is 19.9 kg/m.  General Appearance: Disheveled  Eye Contact:  Fair  Speech:  Garbled  Volume:  Normal  Mood:  Euthymic  Affect:  Appropriate  Thought Process:  Disorganized and Descriptions of Associations: Loose  Orientation:  Full (Time, Place, and Person)  Thought Content:  Delusions  Suicidal Thoughts:  No  Homicidal Thoughts:  No  Memory:  Immediate;   Poor Recent;   Poor Remote;   Poor  Judgement:  Poor  Insight:  Lacking  Psychomotor Activity:  Normal  Concentration:  Concentration: Poor and Attention Span: Poor  Recall:  Poor  Fund of Knowledge:  Poor  Language:  Poor  Akathisia:  No  Handed:   AIMS (if indicated):     Assets:  Armed forces logistics/support/administrative officer Physical Health  ADL's:  Intact  Cognition:  Impaired,  Mild  Sleep:  Number of Hours: 3.45     Treatment Plan Summary:  Pt is a 61 yo male with diagnosis of schizophrenia.  Pt is refusing to take medications and an order for nonemergency forced medications with prolixin IM was placed.    Schizoprenia: stable,  Confirmed with [facility] that pt was on prolixin deconate.  -Pt has been started on prolixin oral.  Continue Prolixin 5 mg in the morning and 10 mg in the evening  Pt received Prolixin decanoate 25 mg every 21 days on 7/4  Pt is on non emergency forced medications. He is to receive prolixin IM if he refuses oral---no longer in need of forced medications  No restraints since last week. He continues to be on nonemergency forced medications.  Mood is labile, still delusional and hyper religious    Agitation: continue orders for ativan 2 mg q4hr prn po or im. ---No longer agitated  Insomnia: only slept 3 h last night. Will order ambien 10 mg this evening  Orthostatic hypotension and tachycardia: continue to push fluids. HR 101 today  Tobacco use disorder: Continue nicotine patch  Diet: regular, discussed soft diet because patient does not have dentures, but he declined  Labs: refuses labs this am. Don't have lipid panel or HbA1c  Disposition: back home with brother once stable.   Possible discharge next week  Hildred Priest, MD 12/07/2016, 12:01 PM

## 2016-12-07 NOTE — Plan of Care (Signed)
Problem: Activity: Goal: Sleeping patterns will improve Outcome: Progressing Patient slept for Estimated Hours of 3.45; q15 minutes safety round maintained, no injury or falls during this shift.    

## 2016-12-07 NOTE — Plan of Care (Signed)
Problem: Safety: Goal: Ability to remain free from injury will improve Outcome: Progressing Patient remains free from injury on the unit.  Problem: Physical Regulation: Goal: Will remain free from infection Outcome: Progressing No signs of infection noted.  Problem: Skin Integrity: Goal: Risk for impaired skin integrity will decrease Outcome: Progressing Patient has adequate intake and ambulates without difficulty.

## 2016-12-07 NOTE — Progress Notes (Signed)
Patient up ad lib ambulating the unit. Patient presented to the medication room with his armband in his hand. This Probation officer told patient that his armband would be replaced but before that could be done patient immediately became verbally aggressive stating, "Look at the damn name lady, do you know how to spell it!?" Patient refused to remove himself from the med room stating, "I need it now lady." Patient's armband replaced. Observed in the dayroom with selective per interaction. Patient remains safe with q 15 minute safety checks.

## 2016-12-08 MED ORDER — POLYETHYLENE GLYCOL 3350 17 G PO PACK
17.0000 g | PACK | Freq: Two times a day (BID) | ORAL | Status: AC
  Administered 2016-12-08 – 2016-12-09 (×2): 17 g via ORAL
  Filled 2016-12-08 (×3): qty 1

## 2016-12-08 MED ORDER — FLUPHENAZINE HCL 5 MG PO TABS
7.5000 mg | ORAL_TABLET | Freq: Every day | ORAL | Status: DC
  Administered 2016-12-08 – 2016-12-12 (×5): 7.5 mg via ORAL
  Filled 2016-12-08 (×5): qty 2

## 2016-12-08 NOTE — Plan of Care (Signed)
Problem: Safety: Goal: Ability to remain free from injury will improve Outcome: Progressing Patient remains safe on the unit, no injuries sustained at this time  Problem: Coping: Goal: Ability to verbalize feelings will improve Outcome: Progressing Although patient gets upset easily, he is redirectable.  Problem: Health Behavior/Discharge Planning: Goal: Compliance with prescribed medication regimen will improve Outcome: Progressing Patient is compliant with his mediation regimen.

## 2016-12-08 NOTE — Plan of Care (Signed)
Problem: Activity: Goal: Sleeping patterns will improve Outcome: Progressing Sleeping >3 hours not sleeping through the night  Problem: Education: Goal: Will be free of psychotic symptoms Outcome: Not Progressing Still having psychotic sx's Goal: Knowledge of the prescribed therapeutic regimen will improve Outcome: Progressing Compliant with meds and aware when they are due  Problem: Coping: Goal: Ability to cope will improve Outcome: Not Progressing Ineffective coping skills remain Goal: Ability to verbalize feelings will improve Outcome: Not Progressing Does not verbalize feelings with staff  Problem: Health Behavior/Discharge Planning: Goal: Compliance with prescribed medication regimen will improve Outcome: Progressing Pt compliant with medications  Problem: Coping: Goal: Ability to verbalize frustrations and anger appropriately will improve Outcome: Not Progressing Pt angry with his internal stimuli Goal: Ability to demonstrate self-control will improve Outcome: Progressing Some self control but still preoccupied with internal stimuli

## 2016-12-08 NOTE — Progress Notes (Signed)
Pt appeared to sleep about 7.5 hours while monitored on 15 minute safety checks.

## 2016-12-08 NOTE — Progress Notes (Signed)
[facility] MD Progress Note  12/08/2016 9:43 AM Dustin [name]  MRN:  [id number] Subjective:    [name] is a 61 yo african-american male who presented to the ER under IVC from [facility] on 11/28/16.  According to the IVC, the patient had not been taking medications and that the patient was stated he could hear demon voices, and was agitated and yelling at neighbors.  Pt was seen at [facility] for his does of [place], and staff from [facility] reported that the patient had been getting it from [facility], but was not able to get his last injection bc nobody was there to administer it.    [name] also reports pt is hypereligious and has been making sexually inappropriate statements to male staff.   While in the ER he was agitated and aggressive. He was very disorganized.  6/28 Today he refused to speak with me he told me he was busy. He refused breakfast and told the staff that he is in areligious fasting. He refused vital signs and medications. Per nurse's "Tangentially explains why he refuses meds, reporting he is Catholic, is "inflicted," and someone at his outpatient mental health provider told him he does not need to take meds."   6/29 patient has refused medications multiple times. Nonemergency medications have been ordered. The patient required a manual hold this morning in order to administer Prolixin. During assessment today he was very disorganized, hyperreligious but he was more cooperative and less argumentative than yesterday  7/2: pt seems to be improving.  He was talkative and a little more friendly than before.  He reports that he slept ok last night, but it took him a little longer than usual to fall asleep.  He is no longer fasting and went to lunch today.  Denies any depression, anxiety, or suicidal/homicidal ideations.  Speech is garble and hard to comprehend.  Patient says he is still fasting. Feels he is praying too much.  Unhappy about having to take meds, says he doesn't need them.    He wrote (scribble)all over the questioners sheets. He also wrote on 2 stere foam cups and his name tag on the room's door.  7/3 patient was uncooperative during assessment today. His speech was very difficult to understand but profanity was used constantly. Patient started crying, I was unable to understand why he was so upset. He asked to be left  the f... Alone.  He has been compliant with medications. He is slept 5.5 hours last night. She has been eating well. He states in his room all day lying in bed. No interaction with others,  no participation in group.  7/4 patient has more cooperative this morning. His speech is very difficult to understand. He talks about random  and very tangential things. Continues to be hyperreligious. Per staff patient has been sexually inappropriate making inappropriate gestures with his mouth to some of the male staff.  Today he is open to discuss administration of Prolixin decanoate.  7/5 patient appears much improved. He received Prolixin Decanoate yesterday. It is still very hard for me to understand what he is saying as his speech is garbled. Still appears that his thought process is quite disorganized as he was talking about random things. His mood continues to be labile and once in a while he'll start crying for no reason. He is leaving his room more often and has been compliant with all medications. He denies side effects, physical complaints, suicidality or homicidality. Denies hallucinations.  7/6 pleasant,  calm and cooperative. Still thought process is quite disorganized. He continues to talk about the Bible and the power of the mind. Denies side effects from medications. As far as physical complaints reports constipation. Denies problems with mood, appetite, energy is sleep or concentration. Denies suicidality, homicidality or hallucinations. He has been compliant with all of medications. No longer on nonemergency forced medications  Per nursing: Pt  compliant with his medications but is observed self dialoging angrily to himself upon approach.  Swearing and irritated but talking to himself.  Observed on the periphery of the unit.  Monitored on 15 minute safety checks and maintained safety on the unit.  Pt appeared to sleep about 7.5 hours while monitored on 15 minute safety checks.  Principal Problem: Undifferentiated schizophrenia ([place]) Diagnosis:   Patient Active Problem List   Diagnosis Date Noted  . Undifferentiated schizophrenia ([place]) [F20.3] 11/29/2016  . Tobacco use disorder [F17.200] 11/29/2016   Total Time spent with patient: 30 minutes    Past Medical History: History reviewed. No pertinent past medical history. History reviewed. No pertinent surgical history.  Family History: History reviewed. No pertinent family history.  Family Psychiatric  History: no pertinent family hx  Social History:  History  Alcohol use Not on file     History  Drug use: Unknown    Social History   Social History  . Marital status: Single    Spouse name: N/A  . Number of children: N/A  . Years of education: N/A   Social History Main Topics  . Smoking status: Current Every Day Smoker  . Smokeless tobacco: Never Used  . Alcohol use None  . Drug use: Unknown  . Sexual activity: Not Asked   Other Topics Concern  . None   Social History Narrative  . None     Current Medications: Current Facility-Administered Medications  Medication Dose Route Frequency Provider Last Rate Last Dose  . acetaminophen (TYLENOL) tablet 650 mg  650 mg Oral Q6H PRN Pucilowska, Jolanta B, MD   650 mg at 12/03/16 0748  . alum & mag hydroxide-simeth (MAALOX/MYLANTA) 200-200-20 MG/5ML suspension 30 mL  30 mL Oral Q4H PRN Pucilowska, Jolanta B, MD      . diphenhydrAMINE (BENADRYL) capsule 25 mg  25 mg Oral QHS Hildred Priest, MD   25 mg at 12/07/16 2134  . fluPHENAZine (PROLIXIN) tablet 10 mg  10 mg Oral QHS Hildred Priest, MD    10 mg at 12/07/16 2135  . fluPHENAZine (PROLIXIN) tablet 5 mg  5 mg Oral Daily Hildred Priest, MD   5 mg at 12/08/16 0827  . fluPHENAZine decanoate (PROLIXIN) injection 25 mg  25 mg Intramuscular Q21 days Hildred Priest, MD   25 mg at 12/06/16 1213  . magnesium hydroxide (MILK OF MAGNESIA) suspension 30 mL  30 mL Oral Daily PRN Pucilowska, Jolanta B, MD   30 mL at 12/06/16 2033  . nicotine (NICODERM CQ - dosed in mg/24 hours) patch 21 mg  21 mg Transdermal Daily Hildred Priest, MD      . zolpidem (AMBIEN) tablet 10 mg  10 mg Oral QHS Hildred Priest, MD   10 mg at 12/07/16 2134    Lab Results: No results found for this or any previous visit (from the past 48 hour(s)).  Blood Alcohol level:  Lab Results  Component Value Date   ETH <5 41/93/7902    Metabolic Disorder Labs: No results found for: HGBA1C, MPG No results found for: PROLACTIN No results found for: CHOL, TRIG,  HDL, CHOLHDL, VLDL, LDLCALC  Physical Findings: AIMS: Facial and Oral Movements Muscles of Facial Expression: Mild Lips and Perioral Area: Mild Jaw: Mild Tongue: Minimal,Extremity Movements Upper (arms, wrists, hands, fingers): None, normal Lower (legs, knees, ankles, toes): None, normal, Trunk Movements Neck, shoulders, hips: None, normal, Overall Severity Severity of abnormal movements (highest score from questions above): Mild Incapacitation due to abnormal movements: None, normal Patient's awareness of abnormal movements (rate only patient's report): No Awareness, Dental Status Current problems with teeth and/or dentures?: Yes Does patient usually wear dentures?: No  CIWA:    COWS:     Musculoskeletal: Strength & Muscle Tone: within normal limits Gait & Station: normal Patient leans: N/A  Psychiatric Specialty Exam: Physical Exam  Vitals reviewed. Constitutional: He is oriented to person, place, and time. He appears well-developed and well-nourished.   Respiratory: Effort normal and breath sounds normal.  Neurological: He is alert and oriented to person, place, and time.  Skin: Skin is warm and dry.  Psychiatric: His mood appears not anxious. He is not agitated. Cognition and memory are impaired. He does not exhibit a depressed mood. He expresses no homicidal and no suicidal ideation.    Review of Systems  Constitutional: Negative.   HENT: Negative.   Eyes: Negative.   Respiratory: Negative.   Cardiovascular: Negative.   Gastrointestinal: Negative.   Genitourinary: Negative.   Musculoskeletal: Negative.   Skin: Negative.   Neurological: Negative.   Endo/Heme/Allergies: Negative.   Psychiatric/Behavioral: Negative.     Blood pressure 99/71, pulse (!) 101, temperature 97.8 F (36.6 C), resp. rate 18, height 6\' 2"  (1.88 m), weight 70.3 kg (155 lb), SpO2 100 %.Body mass index is 19.9 kg/m.  General Appearance: Disheveled  Eye Contact:  Fair  Speech:  Garbled  Volume:  Normal  Mood:  Euthymic  Affect:  Appropriate  Thought Process:  Disorganized and Descriptions of Associations: Loose  Orientation:  Full (Time, Place, and Person)  Thought Content:  Delusions  Suicidal Thoughts:  No  Homicidal Thoughts:  No  Memory:  Immediate;   Poor Recent;   Poor Remote;   Poor  Judgement:  Poor  Insight:  Lacking  Psychomotor Activity:  Normal  Concentration:  Concentration: Poor and Attention Span: Poor  Recall:  Poor  Fund of Knowledge:  Poor  Language:  Poor  Akathisia:  No  Handed:   AIMS (if indicated):     Assets:  Armed forces logistics/support/administrative officer Physical Health  ADL's:  Intact  Cognition:  Impaired,  Mild  Sleep:  Number of Hours: 7.5     Treatment Plan Summary:  Pt is a 61 yo male with diagnosis of schizophrenia.  Pt is refusing to take medications and an order for nonemergency forced medications with prolixin IM was placed.    Schizoprenia: stable,  Confirmed with [facility] that pt was on prolixin deconate.  -Pt has been started  on prolixin oral.  Continue Prolixin 5 mg in the morning and 7.5 po qhs (will reduce night dose due to tachycardia and hypotension)  Pt received Prolixin decanoate 25 mg every 21 days on 7/4  Mood is labile, still delusional and hyper religious ---less over the last 2 days  EPS: continue low dose benadryl 25 mg qhs  Insomnia: only slept 7 h last night. Continue ambien 10 mg po qhsevening  Orthostatic hypotension and tachycardia: continues to have tachycardia.  Will decrease prolixin   Constipation: will order miralax bid  Tobacco use disorder: Continue nicotine patch  Diet: regular, discussed soft  diet because patient does not have dentures, but he declined  Labs: will try to order HbA1c and lipid panel for tomorrow am.  Pt has been refusing labs.  Disposition: back home with brother once stable.   Possible discharge next week  Hildred Priest, MD 12/08/2016, 9:43 AM

## 2016-12-08 NOTE — Progress Notes (Signed)
Patient is alert and oriented to person, place and time. Skin is warm, dry and intact.  Patient currently denies HI/SI at this time and states, "I don't hear any angels or demons either". Medication is taken by patient without difficulty nor noted side affects. Patient was observed ambulating in hall during the shift with a steady gait. Attends meals with selective peer interaction noted. VS WNL, milieu remains therapeutic. Patient will be monitored and physician notified of any acute changes.

## 2016-12-08 NOTE — Progress Notes (Signed)
Recreation Therapy Notes  Date: 07.06.18 Time: 9:30 am Location: Craft Room  Group Topic: Coping Skills  Goal Area(s) Addresses:  Patient will verbalize at least one emotion while participating in group. Patient will verbalize benefit of using art as a coping skill.  Behavioral Response: Did not attend  Intervention: Coloring  Activity: Patients were given coloring sheets to color and were instructed to think about what emotions they were feeling and what their minds were focused on.  Education: LRT educated patients on healthy coping skills.  Education Outcome: Patient did not attend group.  Clinical Observations/Feedback: Patient did not attend group.  Leonette Monarch, LRT/CTRS 12/08/2016 10:30 AM

## 2016-12-08 NOTE — Progress Notes (Signed)
Pt compliant with his medications but is observed self dialoging angrily to himself upon approach.  Swearing and irritated but talking to himself.  Observed on the periphery of the unit.  Monitored on 15 minute safety checks and maintained safety on the unit.

## 2016-12-09 DIAGNOSIS — R000 Tachycardia, unspecified: Secondary | ICD-10-CM

## 2016-12-09 DIAGNOSIS — K5900 Constipation, unspecified: Secondary | ICD-10-CM

## 2016-12-09 DIAGNOSIS — I951 Orthostatic hypotension: Secondary | ICD-10-CM

## 2016-12-09 DIAGNOSIS — F17210 Nicotine dependence, cigarettes, uncomplicated: Secondary | ICD-10-CM

## 2016-12-09 DIAGNOSIS — F39 Unspecified mood [affective] disorder: Secondary | ICD-10-CM

## 2016-12-09 DIAGNOSIS — G4700 Insomnia, unspecified: Secondary | ICD-10-CM

## 2016-12-09 NOTE — Progress Notes (Signed)
Patient noted to be guarded, disorganized and delusional in thought content. While in his room,  patient pointed to some cups that by the window and asked writer to read them. "Make sure you read that." More of the same writing on the cups noted on the notice board, bathroom mirror and on his name label at the door. When asked the reason he was here, he stated that he was kidnapped and brought here. "They kidnapped me, they brought me here." Patient also flirtatious and inappropriate with Probation officer and had to be redirected.

## 2016-12-09 NOTE — Plan of Care (Signed)
Problem: Education: Goal: Will be free of psychotic symptoms Outcome: Not Progressing Remains psychotic, unable to engage in reality-based activities and conversations at this time.

## 2016-12-09 NOTE — [facility] Group Notes (Signed)
[facility] Group Notes:  (Nursing/MHT/Case Management/Adjunct)  Date:  12/09/2016  Time:  6:18 AM  Type of Therapy:  Psychoeducational Skills  Participation Level:  Did Not Attend   Summary of Progress/Problems:  Dustin [name] 12/09/2016, 6:18 AM

## 2016-12-09 NOTE — [facility] Group Notes (Addendum)
[facility] LCSW Group Therapy  12/09/2016 3:01 PM  Type of Therapy:  Group Therapy  Participation Level:  None  Participation Quality:  Attentive  Affect:  Appropriate  Cognitive:  Alert  Insight:  None  Engagement in Therapy:  None  Modes of Intervention:  Activity, Discussion, Education, Problem-solving, Art therapist, Socialization and Support  Summary of Progress/Problems: Coping Skills: Patients defined and discussed healthy coping skills. Patients identified healthy coping skills they would like to try during hospitalization and after discharge. CSW offered insight to varying coping skills that may have been new to patients such as practicing mindfulness.   [name] G. Walker, [place] 12/09/2016, 3:04 PM

## 2016-12-09 NOTE — Progress Notes (Signed)
D: Pt denies SI/HI/AVH. Pt is pleasant and cooperative. Pt presents delusional and confrontational at times. Pt is sarcastic at times, has echolalia at times, and flight of ideas.   A: Pt was offered support and encouragement. Pt was given scheduled medications. Pt was encourage to attend groups. Q 15 minute checks were done for safety.   R: safety maintained on unit.

## 2016-12-09 NOTE — Progress Notes (Signed)
[facility] MD Progress Note  12/09/2016 9:57 AM Dustin [name]  MRN:  [id number] Subjective:    Dustin [name] is a 61 yo african-american male who presented to the ER under IVC from [place] on 11/28/16.  According to the IVC, the patient had not been taking medications and that the patient was stated he could hear demon voices, and was agitated and yelling at neighbors.  Pt was seen at [facility] for his does of [place], and staff from [place] reported that the patient had been getting it from [employer], but was not able to get his last injection bc nobody was there to administer it.    Ms. Dustin [name] also reports pt is hypereligious and has been making sexually inappropriate statements to male staff.   While in the ER he was agitated and aggressive. He was very disorganized.  6/28 Today he refused to speak with me he told me he was busy. He refused breakfast and told the staff that he is in areligious fasting. He refused vital signs and medications. Per nurse's "Tangentially explains why he refuses meds, reporting he is Catholic, is "inflicted," and someone at his outpatient mental health provider told him he does not need to take meds."   6/29 patient has refused medications multiple times. Nonemergency medications have been ordered. The patient required a manual hold this morning in order to administer Prolixin. During assessment today he was very disorganized, hyperreligious but he was more cooperative and less argumentative than yesterday  7/2: pt seems to be improving.  He was talkative and a little more friendly than before.  He reports that he slept ok last night, but it took him a little longer than usual to fall asleep.  He is no longer fasting and went to lunch today.  Denies any depression, anxiety, or suicidal/homicidal ideations.  Speech is garble and hard to comprehend.  Patient says he is still fasting. Feels he is praying too much.  Unhappy about having to take meds, says he doesn't need them.    He wrote (scribble)all over the questioners sheets. He also wrote on 2 stere foam cups and his name tag on the room's door.  7/3 patient was uncooperative during assessment today. His speech was very difficult to understand but profanity was used constantly. Patient started crying, I was unable to understand why he was so upset. He asked to be left  the f... Alone.  He has been compliant with medications. He is slept 5.5 hours last night. She has been eating well. He states in his room all day lying in bed. No interaction with others,  no participation in group.  7/4 patient has more cooperative this morning. His speech is very difficult to understand. He talks about random  and very tangential things. Continues to be hyperreligious. Per staff patient has been sexually inappropriate making inappropriate gestures with his mouth to some of the male staff.  Today he is open to discuss administration of Prolixin decanoate.  7/5 patient appears much improved. He received Prolixin Decanoate yesterday. It is still very hard for me to understand what he is saying as his speech is garbled. Still appears that his thought process is quite disorganized as he was talking about random things. His mood continues to be labile and once in a while he'll start crying for no reason. He is leaving his room more often and has been compliant with all medications. He denies side effects, physical complaints, suicidality or homicidality. Denies hallucinations.  7/6 pleasant,  calm and cooperative. Still thought process is quite disorganized. He continues to talk about the Bible and the power of the mind. Denies side effects from medications. As far as physical complaints reports constipation. Denies problems with mood, appetite, energy is sleep or concentration. Denies suicidality, homicidality or hallucinations. He has been compliant with all of medications. No longer on nonemergency forced medications  12/09/2016 Patient  seen in his room this morning. He is cooperative with this clinician. States that he had a good night's sleep and had good breakfast. Compliant with his medications. He did not have any complaints for this clinician. However he told the nurse that he was kidnapped and brought to the unit. Continues to present disorganized.  Per nursing: Pt compliant with his medications , not logical. Fixated on something wrong with his mouth. Telling the nurse that he was kidnapped and brought here.  Principal Problem: Undifferentiated schizophrenia ([place]) Diagnosis:   Patient Active Problem List   Diagnosis Date Noted  . Undifferentiated schizophrenia ([place]) [F20.3] 11/29/2016  . Tobacco use disorder [F17.200] 11/29/2016   Total Time spent with patient: 30 minutes    Past Medical History: History reviewed. No pertinent past medical history. History reviewed. No pertinent surgical history.  Family History: History reviewed. No pertinent family history.  Family Psychiatric  History: no pertinent family hx  Social History:  History  Alcohol use Not on file     History  Drug use: Unknown    Social History   Social History  . Marital status: Single    Spouse name: N/A  . Number of children: N/A  . Years of education: N/A   Social History Main Topics  . Smoking status: Current Every Day Smoker  . Smokeless tobacco: Never Used  . Alcohol use None  . Drug use: Unknown  . Sexual activity: Not Asked   Other Topics Concern  . None   Social History Narrative  . None     Current Medications: Current Facility-Administered Medications  Medication Dose Route Frequency Provider Last Rate Last Dose  . acetaminophen (TYLENOL) tablet 650 mg  650 mg Oral Q6H PRN Pucilowska, Jolanta B, MD   650 mg at 12/03/16 0748  . alum & mag hydroxide-simeth (MAALOX/MYLANTA) 200-200-20 MG/5ML suspension 30 mL  30 mL Oral Q4H PRN Pucilowska, Jolanta B, MD      . diphenhydrAMINE (BENADRYL) capsule 25 mg  25 mg  Oral QHS Hildred Priest, MD   25 mg at 12/08/16 2129  . fluPHENAZine (PROLIXIN) tablet 5 mg  5 mg Oral Daily Hildred Priest, MD   5 mg at 12/09/16 0854  . fluPHENAZine (PROLIXIN) tablet 7.5 mg  7.5 mg Oral QHS Hildred Priest, MD   7.5 mg at 12/08/16 2130  . fluPHENAZine decanoate (PROLIXIN) injection 25 mg  25 mg Intramuscular Q21 days Hildred Priest, MD   25 mg at 12/06/16 1213  . magnesium hydroxide (MILK OF MAGNESIA) suspension 30 mL  30 mL Oral Daily PRN Pucilowska, Jolanta B, MD   30 mL at 12/06/16 2033  . nicotine (NICODERM CQ - dosed in mg/24 hours) patch 21 mg  21 mg Transdermal Daily Hernandez-Gonzalez, Seth Bake, MD      . polyethylene glycol (MIRALAX / GLYCOLAX) packet 17 g  17 g Oral BID Hildred Priest, MD   17 g at 12/08/16 1206  . zolpidem (AMBIEN) tablet 10 mg  10 mg Oral QHS Hildred Priest, MD   10 mg at 12/08/16 2200    Lab Results: No results found for  this or any previous visit (from the past 48 hour(s)).  Blood Alcohol level:  Lab Results  Component Value Date   ETH <5 96/09/5407    Metabolic Disorder Labs: No results found for: HGBA1C, MPG No results found for: PROLACTIN No results found for: CHOL, TRIG, HDL, CHOLHDL, VLDL, LDLCALC  Physical Findings: AIMS: Facial and Oral Movements Muscles of Facial Expression: Mild Lips and Perioral Area: Mild Jaw: Mild Tongue: Minimal,Extremity Movements Upper (arms, wrists, hands, fingers): None, normal Lower (legs, knees, ankles, toes): None, normal, Trunk Movements Neck, shoulders, hips: None, normal, Overall Severity Severity of abnormal movements (highest score from questions above): Mild Incapacitation due to abnormal movements: None, normal Patient's awareness of abnormal movements (rate only patient's report): No Awareness, Dental Status Current problems with teeth and/or dentures?: Yes Does patient usually wear dentures?: No  CIWA:    COWS:      Musculoskeletal: Strength & Muscle Tone: within normal limits Gait & Station: normal Patient leans: N/A  Psychiatric Specialty Exam: Physical Exam  Vitals reviewed. Constitutional: He is oriented to person, place, and time. He appears well-developed and well-nourished.  Respiratory: Effort normal and breath sounds normal.  Neurological: He is alert and oriented to person, place, and time.  Skin: Skin is warm and dry.  Psychiatric: His mood appears not anxious. He is not agitated. Cognition and memory are impaired. He does not exhibit a depressed mood. He expresses no homicidal and no suicidal ideation.    Review of Systems  Constitutional: Negative.   HENT: Negative.   Eyes: Negative.   Respiratory: Negative.   Cardiovascular: Negative.   Gastrointestinal: Negative.   Genitourinary: Negative.   Musculoskeletal: Negative.   Skin: Negative.   Neurological: Negative.   Endo/Heme/Allergies: Negative.   Psychiatric/Behavioral: Negative.     Blood pressure 92/77, pulse (!) 117, temperature 98.6 F (37 C), temperature source Oral, resp. rate 18, height 6\' 2"  (1.88 m), weight 155 lb (70.3 kg), SpO2 100 %.Body mass index is 19.9 kg/m.  General Appearance: Disheveled  Eye Contact:  Fair  Speech:  Garbled  Volume:  Normal  Mood:  Euthymic  Affect:  Appropriate  Thought Process:  Disorganized and Descriptions of Associations: Loose  Orientation:  Full (Time, Place, and Person)  Thought Content:  Delusions  Suicidal Thoughts:  No  Homicidal Thoughts:  No  Memory:  Immediate;   Poor Recent;   Poor Remote;   Poor  Judgement:  Poor  Insight:  Lacking  Psychomotor Activity:  Normal  Concentration:  Concentration: Poor and Attention Span: Poor  Recall:  Poor  Fund of Knowledge:  Poor  Language:  Poor  Akathisia:  No  Handed:   AIMS (if indicated):     Assets:  Armed forces logistics/support/administrative officer Physical Health  ADL's:  Intact  Cognition:  Impaired,  Mild  Sleep:  Number of Hours: 5.45      Treatment Plan Summary:  Pt is a 61 yo male with diagnosis of schizophrenia.  Pt is refusing to take medications and an order for nonemergency forced medications with prolixin IM was placed.    Schizoprenia: stable,  Confirmed with [facility] that pt was on prolixin deconate.  -Pt has been started on prolixin oral.  Continue Prolixin 5 mg in the morning and 7.5 po qhs (will reduce night dose due to tachycardia and hypotension)  Pt received Prolixin decanoate 25 mg every 21 days on 7/4  Mood is labile, still delusional and hyper religious ---less over the last 2 days  EPS: continue  low dose benadryl 25 mg qhs  Insomnia: only slept 7 h last night. Continue ambien 10 mg po qhsevening  Orthostatic hypotension and tachycardia: continues to have tachycardia.  Will decrease prolixin   Constipation: will order miralax bid  Tobacco use disorder: Continue nicotine patch  Diet: regular, discussed soft diet because patient does not have dentures, but he declined  Labs: will try to order HbA1c and lipid panel for tomorrow am.  Pt has been refusing labs.  Disposition: back home with brother once stable.   Possible discharge next week  Elvin So, MD 12/09/2016, 9:57 AM

## 2016-12-09 NOTE — Plan of Care (Signed)
Problem: Safety: Goal: Periods of time without injury will increase Outcome: Progressing Pt safe on the unit at this time   

## 2016-12-09 NOTE — [facility] Group Notes (Signed)
[facility] LCSW Group Therapy  12/09/2016 1:53 PM  Type of Therapy:  Group Therapy  Participation Level:  Patient did not attend group. CSW invited patient to group.   Summary of Progress/Problems: Feelings around Relapse. Group members discussed the meaning of relapse and shared personal stories of relapse, how it affected them and others, and how they perceived themselves during this time. Group members were encouraged to identify triggers, warning signs and coping skills used when facing the possibility of relapse. Social supports were discussed and explored in detail. Patients also discussed facing disappointment and how that can trigger someone to relapse.  Kaarin Pardy G. Mercer, [place] 12/09/2016, 1:54 PM

## 2016-12-10 MED ORDER — LORAZEPAM 0.5 MG PO TABS
0.5000 mg | ORAL_TABLET | Freq: Once | ORAL | Status: AC
  Administered 2016-12-10: 0.5 mg via ORAL
  Filled 2016-12-10: qty 1

## 2016-12-10 NOTE — [facility] Group Notes (Signed)
[facility] Group Notes:  (Nursing/MHT/Case Management/Adjunct)  Date:  12/10/2016  Time:  7:25 AM  Type of Therapy:  Psychoeducational Skills  Participation Level:  Active  Participation Quality:  Appropriate and Attentive  Affect:  Appropriate  Cognitive:  Appropriate  Insight:  Appropriate  Engagement in Group:  Engaged  Modes of Intervention:  Discussion, Socialization and Support  Summary of Progress/Problems:  Reece Agar 12/10/2016, 7:25 AM

## 2016-12-10 NOTE — Plan of Care (Signed)
Problem: Health Behavior/Discharge Planning: Goal: Compliance with prescribed medication regimen will improve Outcome: Progressing Taking medications as prescribed

## 2016-12-10 NOTE — Plan of Care (Signed)
Problem: Safety: Goal: Ability to remain free from injury will improve Outcome: Progressing Pt remains safe on the unit.Safety precautions maintained

## 2016-12-10 NOTE — Plan of Care (Signed)
Problem: Education: Goal: Will be free of psychotic symptoms Outcome: Not Progressing Remains paranoid with auditory hallucinations, guarded

## 2016-12-10 NOTE — Plan of Care (Signed)
Problem: Coping: Goal: Ability to verbalize feelings will improve Outcome: Not Progressing Guarded, forwards little. Short with conversation and will not elaborate on much said.

## 2016-12-10 NOTE — Progress Notes (Signed)
Patient is visible in the milieu. Continues to display disorganized thinking and behavior. Intrusive and sexually inappropriate but redirected. Pt denies thoughts of self harm. Pleasant and able to communicate his needs. Responding to internal stimuli at times. Pt has no major concern so far. Able to program in the milieu. Staff continue to provide support and encouragements. Patient' safety maintained.
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
## 2016-12-10 NOTE — Plan of Care (Signed)
Problem: Pain Managment: Goal: General experience of comfort will improve Outcome: Progressing Pain assessed. Pt denies pain   

## 2016-12-10 NOTE — Plan of Care (Signed)
Problem: Education: Goal: Knowledge of the prescribed therapeutic regimen will improve Outcome: Not Progressing Pt unable to comprehend treatment process

## 2016-12-10 NOTE — Plan of Care (Signed)
Problem: Activity: Goal: Sleeping patterns will improve Outcome: Progressing Pt sleeps all night, naps during the day

## 2016-12-10 NOTE — Progress Notes (Signed)
Pt filled out self inventory and turned it in to this nurse. The page is completely covered in letters, nearly all of them in no coherent pattern. Pt did come out of his room for lunch and to go outside in courtyard with staff/peers. Appears to be calm, in no distress. Blunted affect. Medication compliant although he began crying during medication administration, but would not carry on a conversation regarding why he was crying. No behavioral issues observed. Denies SI/HI/AVH. Cooperative. Guarded, forwards little. Support and encouragement provided. Medications administered as ordered with education. Safety maintained with every 15 minute checks. Will continue to monitor.

## 2016-12-10 NOTE — Plan of Care (Signed)
Problem: Coping: Goal: Ability to cope will improve Outcome: Not Progressing Intrusive, sexually inapropriate

## 2016-12-10 NOTE — Plan of Care (Signed)
Problem: Safety: Goal: Ability to remain free from injury will improve Outcome: Progressing Patient' safety maintained
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
## 2016-12-10 NOTE — Plan of Care (Signed)
Problem: Education: Goal: Will be free of psychotic symptoms Outcome: Not Progressing Continues to be delusional, guarded.

## 2016-12-10 NOTE — Progress Notes (Signed)
Pt complaining of "cramping." Does not elaborate further. Dr. Einar Grad aware as pt reported the same to her. Ordered ativan ONCE, given as ordered. Pt requesting a snack and reporting "I'm lonely." Encouraged patient to get out of bed, go to dayroom for snack. Pt refuses, stating "I'm not that kind of lonely." Safety maintained with every 15 minute checks. Will continue to monitor.

## 2016-12-10 NOTE — [facility] Group Notes (Signed)
[facility] LCSW Group Therapy  12/10/2016 2:19 PM  Type of Therapy:  Group Therapy  Participation Level:  Patient did not attend group. CSW invited patient to group.   Summary of Progress/Problems: Self esteem: Patients discussed self esteem and how it impacts them. They discussed what aspects in their lives has influenced their self esteem. They were challenged to identify changes that are needed in order to improve self esteem. Patients participated in activity where they had to identify positive adjectives they felt described their personality. Patients shared with the group on the following areas: Things I am good at, What I like about my appearance, I've helped others by, What I value the most, compliments I have received, challenges I have overcome, thing that make me unique, and Times I've made others happy.   Dustin [name]. [place], [place] 12/10/2016, 2:19 PM

## 2016-12-11 NOTE — Tx Team (Signed)
Interdisciplinary Treatment and Diagnostic Plan Update  12/11/2016 Time of Session: 11:00 AM Dustin [name] MRN: [id number]  Principal Diagnosis: Undifferentiated schizophrenia [facility])  Secondary Diagnoses: Principal Problem:   Undifferentiated schizophrenia ([place]) Active Problems:   Tobacco use disorder   Current Medications:  Current Facility-Administered Medications  Medication Dose Route Frequency Provider Last Rate Last Dose  . acetaminophen (TYLENOL) tablet 650 mg  650 mg Oral Q6H PRN Pucilowska, Jolanta B, MD   650 mg at 12/03/16 0748  . alum & mag hydroxide-simeth (MAALOX/MYLANTA) 200-200-20 MG/5ML suspension 30 mL  30 mL Oral Q4H PRN Pucilowska, Jolanta B, MD      . diphenhydrAMINE (BENADRYL) capsule 25 mg  25 mg Oral QHS Hildred Priest, MD   25 mg at 12/10/16 2212  . fluPHENAZine (PROLIXIN) tablet 5 mg  5 mg Oral Daily Hildred Priest, MD   5 mg at 12/11/16 7829  . fluPHENAZine (PROLIXIN) tablet 7.5 mg  7.5 mg Oral QHS Hildred Priest, MD   7.5 mg at 12/10/16 2213  . fluPHENAZine decanoate (PROLIXIN) injection 25 mg  25 mg Intramuscular Q21 days Hildred Priest, MD   25 mg at 12/06/16 1213  . magnesium hydroxide (MILK OF MAGNESIA) suspension 30 mL  30 mL Oral Daily PRN Pucilowska, Jolanta B, MD   30 mL at 12/06/16 2033  . nicotine (NICODERM CQ - dosed in mg/24 hours) patch 21 mg  21 mg Transdermal Daily Hildred Priest, MD      . zolpidem (AMBIEN) tablet 10 mg  10 mg Oral QHS Hildred Priest, MD   10 mg at 12/10/16 2213   PTA Medications: Prescriptions Prior to Admission  Medication Sig Dispense Refill Last Dose  . acetaminophen (TYLENOL) 500 MG tablet Take 500 mg by mouth daily as needed for mild pain or moderate pain.    PRN at PRN  . fluPHENAZine (PROLIXIN) 2.5 MG/ML injection Inject 2 mg into the muscle every 30 (thirty) days. 2 mg = 0.8 ML   Past Month at Unknown time  . Multiple Vitamin (MULTIVITAMIN) capsule  Take 1 capsule by mouth daily.    Past Month at Unknown time    Patient Stressors: Educational concerns Medication change or noncompliance  Patient Strengths: Ability for insight Active sense of humor Capable of independent living Motivation for treatment/growth Religious Affiliation Supportive family/friends  Treatment Modalities: Medication Management, Group therapy, Case management,  1 to 1 session with clinician, Psychoeducation, Recreational therapy.   Physician Treatment Plan for Primary Diagnosis: Undifferentiated schizophrenia ([name]) Long Term Goal(s): Improvement in symptoms so as ready for discharge Improvement in symptoms so as ready for discharge   Short Term Goals: Ability to demonstrate self-control will improve Compliance with prescribed medications will improve Ability to identify changes in lifestyle to reduce recurrence of condition will improve Ability to identify and develop effective coping behaviors will improve  Medication Management: Evaluate patient's response, side effects, and tolerance of medication regimen.  Therapeutic Interventions: 1 to 1 sessions, Unit Group sessions and Medication administration.  Evaluation of Outcomes: Progressing   Physician Treatment Plan for Secondary Diagnosis: Principal Problem:   Undifferentiated schizophrenia ([name]) Active Problems:   Tobacco use disorder  Long Term Goal(s): Improvement in symptoms so as ready for discharge Improvement in symptoms so as ready for discharge   Short Term Goals: Ability to demonstrate self-control will improve Compliance with prescribed medications will improve Ability to identify changes in lifestyle to reduce recurrence of condition will improve Ability to identify and develop effective coping behaviors will improve  Medication Management: Evaluate patient's response, side effects, and tolerance of medication regimen.  Therapeutic Interventions: 1 to 1 sessions, Unit Group  sessions and Medication administration.  Evaluation of Outcomes:  Progressing   RN Treatment Plan for Primary Diagnosis: Undifferentiated schizophrenia ([name]) Long Term Goal(s): Knowledge of disease and therapeutic regimen to maintain health will improve  Short Term Goals: Ability to demonstrate self-control, Ability to verbalize feelings will improve and Compliance with prescribed medications will improve  Medication Management: RN will administer medications as ordered by provider, will assess and evaluate patient's response and provide education to patient for prescribed medication. RN will report any adverse and/or side effects to prescribing provider.  Therapeutic Interventions: 1 on 1 counseling sessions, Psychoeducation, Medication administration, Evaluate responses to treatment, Monitor vital signs and CBGs as ordered, Perform/monitor CIWA, COWS, AIMS and Fall Risk screenings as ordered, Perform wound care treatments as ordered.  Evaluation of Outcomes: Progressing    LCSW Treatment Plan for Primary Diagnosis: Undifferentiated schizophrenia ([name]) Long Term Goal(s): Safe transition to appropriate next level of care at discharge, Engage patient in therapeutic group addressing interpersonal concerns.  Short Term Goals: Engage patient in aftercare planning with referrals and resources, Increase social support, Increase emotional regulation and Increase skills for wellness and recovery  Therapeutic Interventions: Assess for all discharge needs, 1 to 1 time with Social worker, Explore available resources and support systems, Assess for adequacy in community support network, Educate family and significant other(s) on suicide prevention, Complete Psychosocial Assessment, Interpersonal group therapy.  Evaluation of Outcomes: Progressing    Progress in Treatment: Attending groups: Yes. Participating in groups: No. Taking medication as prescribed: Yes. Toleration medication:  Yes. Family/Significant other contact made: Yes, individual(s) contacted:  CSW spoke with patient's brother. Patient understands diagnosis: Yes. Discussing patient identified problems/goals with staff: Yes. Medical problems stabilized or resolved: Yes. Denies suicidal/homicidal ideation: Yes. Issues/concerns per patient self-inventory: No.  New problem(s) identified: No, Describe:  None.  New Short Term/Long Term Goal(s): Patient unable to state goal at this time.  Discharge Plan or Barriers: CSW assessing appropriate discharge plan.  Reason for Continuation of Hospitalization: Aggression Delusions  Hallucinations  Estimated Length of Stay: 7 days   Attendees: Patient: 12/11/2016   Physician: Dr. Bary Leriche, MD 12/11/2016   Nursing: Elige Radon, RN 12/11/2016   RN Care Manager: 12/11/2016   Social Worker: Lurline Idol, LCSW 12/11/2016   Recreational Therapist: Everitt Amber, LRT/CTRS  12/11/2016   Other: Drue Dun, chaplain 12/11/2016   Other:  12/11/2016   Other: 12/11/2016        Scribe for Treatment Team: Joanne Chars, [place] 12/11/2016 2:06 PM

## 2016-12-11 NOTE — Progress Notes (Signed)
Recreation Therapy Notes  Date: 07.09.18 Time: 9:30 am Location: Craft Room  Group Topic: Self-expression  Goal Area(s) Addresses:  Patient will identify one color per emotion listed on wheel. Patient will verbalize benefit of using art as a means of self-expression. Patient will verbalize one emotion experienced during session.  Patient will be educated on other forms of self-expression.  Behavioral Response: Did not attend  Intervention: Emotion Wheel  Activity: Patients were given an Emotion Wheel worksheet and were instructed to pick a color for each emotion listed on the wheel and color the section in the color they chose.  Education: LRT educated patients on other forms of self-expression.  Education Outcome: Patient did not attend group.   Clinical Observations/Feedback: Patient did not attend group.   Leonette Monarch, LRT/CTRS 12/11/2016 9:58 AM

## 2016-12-11 NOTE — [facility] Group Notes (Signed)
[facility] LCSW Group Therapy Note  Date/Time: 12/11/16, 1300  Type of Therapy and Topic:  Group Therapy:  Overcoming Obstacles  Participation Level:  Pt did not attend group.  Description of Group:    In this group patients will be encouraged to explore what they see as obstacles to their own wellness and recovery. They will be guided to discuss their thoughts, feelings, and behaviors related to these obstacles. The group will process together ways to cope with barriers, with attention given to specific choices patients can make. Each patient will be challenged to identify changes they are motivated to make in order to overcome their obstacles. This group will be process-oriented, with patients participating in exploration of their own experiences as well as giving and receiving support and challenge from other group members.  Therapeutic Goals: 1. Patient will identify personal and current obstacles as they relate to admission. 2. Patient will identify barriers that currently interfere with their wellness or overcoming obstacles.  3. Patient will identify feelings, thought process and behaviors related to these barriers. 4. Patient will identify two changes they are willing to make to overcome these obstacles:    Summary of Patient Progress      Therapeutic Modalities:   Cognitive Behavioral Therapy Solution Focused Therapy Motivational Interviewing Relapse Prevention Therapy  Greg Ollie Delano, LCSW 

## 2016-12-11 NOTE — Progress Notes (Signed)
Patient is alert and oriented to self. Skin is warm, dry and intact. Patient currently denies HI/SI at this time. Medication is taken by patient without difficulty nor noted side affects. Patient was observed ambulating in hall during the shift with a steady gait. Attends meals with selective peer interaction noted. VS WNL, milieu remains therapeutic. Patient will be monitored and physician notified of any acute changes.

## 2016-12-11 NOTE — [facility] Group Notes (Signed)
[facility] Group Notes:  (Nursing/MHT/Case Management/Adjunct)  Date:  12/11/2016  Time:  3:52 PM  Type of Therapy:  Psychoeducational Skills  Participation Level:  Minimal  Participation Quality:  Attentive and Redirectable  Affect:  Flat  Cognitive:  Oriented  Insight:  Improving  Engagement in Group:  Improving  Modes of Intervention:  Discussion and Education  Summary of Progress/Problems:  Charise Killian 12/11/2016, 3:52 PM

## 2016-12-11 NOTE — Plan of Care (Signed)
Problem: Education: Goal: Knowledge of [facility] Education information/materials will improve Outcome: Not Progressing Patient needs constant reminders regarding personal health practices. Not progressing at this time.  Problem: Safety: Goal: Ability to remain free from injury will improve Outcome: Progressing Patient remains free form injury at this time.  Problem: Coping: Goal: Ability to verbalize frustrations and anger appropriately will improve Outcome: Progressing Patient able to express self without frustrations or anger

## 2016-12-11 NOTE — [facility] Group Notes (Signed)
[facility] Group Notes:  (Nursing/MHT/Case Management/Adjunct)  Date:  12/11/2016  Time:  6:20 AM  Type of Therapy:  Psychoeducational Skills  Participation Level:  Did Not Attend   Summary of Progress/Problems:  Reece Agar 12/11/2016, 6:20 AM

## 2016-12-11 NOTE — Plan of Care (Signed)
Problem: Safety: Goal: Periods of time without injury will increase Outcome: Progressing No injuries sustained on unit at this time.

## 2016-12-11 NOTE — Progress Notes (Signed)
[facility] MD Progress Note  12/11/2016 1:33 PM TRACIE LINDBLOOM  MRN:  [id number] Subjective:    [name] is a 61 yo african-american male who presented to the ER under IVC from [place] on 11/28/16.  According to the IVC, the patient had not been taking medications and that the patient was stated he could hear demon voices, and was agitated and yelling at neighbors.  Pt was seen at [facility] for his does of [place], and staff from [facility] reported that the patient had been getting it from [place], but was not able to get his last injection bc nobody was there to administer it.    [name] also reports pt is hypereligious and has been making sexually inappropriate statements to male staff.   While in the ER he was agitated and aggressive. He was very disorganized.  6/28 Today he refused to speak with me he told me he was busy. He refused breakfast and told the staff that he is in areligious fasting. He refused vital signs and medications. Per nurse's "Tangentially explains why he refuses meds, reporting he is Catholic, is "inflicted," and someone at his outpatient mental health provider told him he does not need to take meds."   6/29 patient has refused medications multiple times. Nonemergency medications have been ordered. The patient required a manual hold this morning in order to administer Prolixin. During assessment today he was very disorganized, hyperreligious but he was more cooperative and less argumentative than yesterday  7/2: pt seems to be improving.  He was talkative and a little more friendly than before.  He reports that he slept ok last night, but it took him a little longer than usual to fall asleep.  He is no longer fasting and went to lunch today.  Denies any depression, anxiety, or suicidal/homicidal ideations.  Speech is garble and hard to comprehend.  Patient says he is still fasting. Feels he is praying too much.  Unhappy about having to take meds, says he doesn't need them.    He wrote (scribble)all over the questioners sheets. He also wrote on 2 stere foam cups and his name tag on the room's door.  7/3 patient was uncooperative during assessment today. His speech was very difficult to understand but profanity was used constantly. Patient started crying, I was unable to understand why he was so upset. He asked to be left  the f... Alone.  He has been compliant with medications. He is slept 5.5 hours last night. She has been eating well. He states in his room all day lying in bed. No interaction with others,  no participation in group.  7/4 patient has more cooperative this morning. His speech is very difficult to understand. He talks about random  and very tangential things. Continues to be hyperreligious. Per staff patient has been sexually inappropriate making inappropriate gestures with his mouth to some of the male staff.  Today he is open to discuss administration of Prolixin decanoate.  7/5 patient appears much improved. He received Prolixin Decanoate yesterday. It is still very hard for me to understand what he is saying as his speech is garbled. Still appears that his thought process is quite disorganized as he was talking about random things. His mood continues to be labile and once in a while he'll start crying for no reason. He is leaving his room more often and has been compliant with all medications. He denies side effects, physical complaints, suicidality or homicidality. Denies hallucinations.  7/6 pleasant,  calm and cooperative. Still thought process is quite disorganized. He continues to talk about the Bible and the power of the mind. Denies side effects from medications. As far as physical complaints reports constipation. Denies problems with mood, appetite, energy is sleep or concentration. Denies suicidality, homicidality or hallucinations. He has been compliant with all of medications. No longer on nonemergency forced medications  12/09/2016 Patient  seen in his room this morning. He is cooperative with this clinician. States that he had a good night's sleep and had good breakfast. Compliant with his medications. He did not have any complaints for this clinician. However he told the nurse that he was kidnapped and brought to the unit. Continues to present disorganized.  12/10/2016 Patient seen in his room this morning. States that he is feeling all crampy all over his body. He did not get up to eat his breakfast and refuses medication. This clinician examined patient with the nurse present in the room to check for any rigidity or cogwheeling. His muscles his upper arms are quite flexible. There is no rigidity. Patient did report something wrong in his mouth. Which has been a constant complaint. Denies hearing voices. Denies any suicidal thoughts. Per nursing: Pt compliant with his medications , not logical. Fixated on something wrong with his mouth. Telling the nurse that he was kidnapped and brought here.  12/11/2016. [name] has no complaints. He is better "no worries". He does not talk about his delusions. Denies side effects of medications, somatic complaints or problems with sleep and appetite. He is secluded to his room and does not interact with staff or peers.  Per nursing: Patient is alert and oriented to self. Skin is warm, dry and intact. Patient currently denies HI/SI at this time. Medication is taken by patient without difficulty nor noted side affects. Patient was observed ambulating in hall during the shift with a steady gait. Attends meals with selective peer interaction noted. VS WNL, milieu remains therapeutic. Patient will be monitored and physician notified of any acute changes.  Principal Problem: Undifferentiated schizophrenia ([place]) Diagnosis:   Patient Active Problem List   Diagnosis Date Noted  . Undifferentiated schizophrenia ([place]) [F20.3] 11/29/2016  . Tobacco use disorder [F17.200] 11/29/2016   Total Time spent with  patient: 30 minutes    Past Medical History: History reviewed. No pertinent past medical history. History reviewed. No pertinent surgical history.  Family History: History reviewed. No pertinent family history.  Family Psychiatric  History: no pertinent family hx  Social History:  History  Alcohol use Not on file     History  Drug use: Unknown    Social History   Social History  . Marital status: Single    Spouse name: N/A  . Number of children: N/A  . Years of education: N/A   Social History Main Topics  . Smoking status: Current Every Day Smoker  . Smokeless tobacco: Never Used  . Alcohol use None  . Drug use: Unknown  . Sexual activity: Not Asked   Other Topics Concern  . None   Social History Narrative  . None     Current Medications: Current Facility-Administered Medications  Medication Dose Route Frequency Provider Last Rate Last Dose  . acetaminophen (TYLENOL) tablet 650 mg  650 mg Oral Q6H PRN Jettie Lazare B, MD   650 mg at 12/03/16 0748  . alum & mag hydroxide-simeth (MAALOX/MYLANTA) 200-200-20 MG/5ML suspension 30 mL  30 mL Oral Q4H PRN Deonte Otting B, MD      .  diphenhydrAMINE (BENADRYL) capsule 25 mg  25 mg Oral QHS Hildred Priest, MD   25 mg at 12/10/16 2212  . fluPHENAZine (PROLIXIN) tablet 5 mg  5 mg Oral Daily Hildred Priest, MD   5 mg at 12/11/16 3500  . fluPHENAZine (PROLIXIN) tablet 7.5 mg  7.5 mg Oral QHS Hildred Priest, MD   7.5 mg at 12/10/16 2213  . fluPHENAZine decanoate (PROLIXIN) injection 25 mg  25 mg Intramuscular Q21 days Hildred Priest, MD   25 mg at 12/06/16 1213  . magnesium hydroxide (MILK OF MAGNESIA) suspension 30 mL  30 mL Oral Daily PRN Morna Flud B, MD   30 mL at 12/06/16 2033  . nicotine (NICODERM CQ - dosed in mg/24 hours) patch 21 mg  21 mg Transdermal Daily Hildred Priest, MD      . zolpidem (AMBIEN) tablet 10 mg  10 mg Oral QHS  Hildred Priest, MD   10 mg at 12/10/16 2213    Lab Results: No results found for this or any previous visit (from the past 97 hour(s)).  Blood Alcohol level:  Lab Results  Component Value Date   ETH <5 93/81/8299    Metabolic Disorder Labs: No results found for: HGBA1C, MPG No results found for: PROLACTIN No results found for: CHOL, TRIG, HDL, CHOLHDL, VLDL, LDLCALC  Physical Findings: AIMS: Facial and Oral Movements Muscles of Facial Expression: Mild Lips and Perioral Area: Mild Jaw: Mild Tongue: Minimal,Extremity Movements Upper (arms, wrists, hands, fingers): None, normal Lower (legs, knees, ankles, toes): None, normal, Trunk Movements Neck, shoulders, hips: None, normal, Overall Severity Severity of abnormal movements (highest score from questions above): Mild Incapacitation due to abnormal movements: None, normal Patient's awareness of abnormal movements (rate only patient's report): No Awareness, Dental Status Current problems with teeth and/or dentures?: Yes Does patient usually wear dentures?: No  CIWA:    COWS:     Musculoskeletal: Strength & Muscle Tone: within normal limits Gait & Station: normal Patient leans: N/A  Psychiatric Specialty Exam: Physical Exam  Vitals reviewed. Constitutional: He is oriented to person, place, and time. He appears well-developed and well-nourished.  Respiratory: Effort normal and breath sounds normal.  Neurological: He is alert and oriented to person, place, and time.  Skin: Skin is warm and dry.  Psychiatric: His mood appears not anxious. He is not agitated. Cognition and memory are impaired. He does not exhibit a depressed mood. He expresses no homicidal and no suicidal ideation.    Review of Systems  Constitutional: Negative.   HENT: Negative.   Eyes: Negative.   Respiratory: Negative.   Cardiovascular: Negative.   Gastrointestinal: Negative.   Genitourinary: Negative.   Musculoskeletal: Negative.   Skin:  Negative.   Neurological: Negative.   Endo/Heme/Allergies: Negative.   Psychiatric/Behavioral: Negative.     Blood pressure 105/89, pulse (!) 113, temperature 98.2 F (36.8 C), temperature source Oral, resp. rate 17, height 6\' 2"  (1.88 m), weight 70.3 kg (155 lb), SpO2 93 %.Body mass index is 19.9 kg/m.  General Appearance: Disheveled  Eye Contact:  Fair  Speech:  Garbled  Volume:  Normal  Mood:  Euthymic  Affect:  Appropriate  Thought Process:  Disorganized and Descriptions of Associations: Loose  Orientation:  Full (Time, Place, and Person)  Thought Content:  Delusions  Suicidal Thoughts:  No  Homicidal Thoughts:  No  Memory:  Immediate;   Poor Recent;   Poor Remote;   Poor  Judgement:  Poor  Insight:  Lacking  Psychomotor Activity:  Normal  Concentration:  Concentration: Poor and Attention Span: Poor  Recall:  Poor  Fund of Knowledge:  Poor  Language:  Poor  Akathisia:  No  Handed:   AIMS (if indicated):     Assets:  Armed forces logistics/support/administrative officer Physical Health  ADL's:  Intact  Cognition:  Impaired,  Mild  Sleep:  Number of Hours: 5.45     Treatment Plan Summary:  Pt is a 61 yo male with diagnosis of schizophrenia.  Pt is refusing to take medications and an order for nonemergency forced medications with prolixin IM was placed.    Schizoprenia: stable,  Confirmed with [facility] that pt was on prolixin deconate.  -Pt has been started on prolixin oral.  Continue Prolixin 5 mg in the morning and 7.5 po qhs (will reduce night dose due to tachycardia and hypotension)  Pt received Prolixin decanoate 25 mg every 21 days on 7/4  Mood is improving, patient fixated on somatic complaints.  EPS: continue low dose benadryl 25 mg qhs, order 1 time dose of Ativan at 0.5 mg  Insomnia: only slept 7 h last night. Continue ambien 10 mg po qhsevening  Orthostatic hypotension and tachycardia: continues to have tachycardia.  Will decrease prolixin   Constipation: will order miralax  bid  Tobacco use disorder: Continue nicotine patch  Diet: regular, discussed soft diet because patient does not have dentures, but he declined  Labs: will try to order HbA1c and lipid panel for tomorrow am.  Pt has been refusing labs.  Disposition: back home with brother once stable.   Possible discharge next week  12/11/2016. No medication changes.  Orson Slick, MD 12/11/2016, 1:33 PM

## 2016-12-12 NOTE — Plan of Care (Signed)
Problem: Activity: Goal: Sleeping patterns will improve Outcome: Progressing Per report patient is sleeping at least 6 hr 30 min per night.  Problem: Coping: Goal: Ability to verbalize frustrations and anger appropriately will improve Outcome: Progressing Patient able to verbalize frustrations to staff appropriately.  Goal: Ability to demonstrate self-control will improve Outcome: Progressing Patient has been able to maintain self control.

## 2016-12-12 NOTE — Progress Notes (Signed)
Patient is alert and oriented to self. Skin is warm, dry and intact. Patient was observed ambulating in hall during the shift with a steady gait. In room majority of shift in bed with eyes closed. Attends meals with minimal peer interaction noted. Milieu remains therapeutic with q 15 minute safety checks. Patient will be monitored and physician notified of any acute changes.

## 2016-12-12 NOTE — Progress Notes (Signed)
Pt irritable and labile.  Observed self dialoging and is vulgar when doing this.  Medication compliant. Guarded from staff and peers.  Denies s/i or h/i.  Obviously hallucinating. One out burst on night shift which he then just yelled at staff but went back to bed.  Monitored on 15 minute safety checks and maintained safety.

## 2016-12-12 NOTE — Progress Notes (Signed)
[facility] MD Progress Note  12/12/2016 5:58 PM RAJEEV ESCUE  MRN:  [id number] Subjective:    Mr. Carrick Rijos is a 61 yo african-american male who presented to the ER under IVC from [place] on 11/28/16.  According to the IVC, the patient had not been taking medications and that the patient was stated he could hear demon voices, and was agitated and yelling at neighbors.  Pt was seen at [facility] for his does of [place], and staff from [place] reported that the patient had been getting it from [place], but was not able to get his last injection bc nobody was there to administer it.    [name] also reports pt is hypereligious and has been making sexually inappropriate statements to male staff.   While in the ER he was agitated and aggressive. He was very disorganized.  6/28 Today he refused to speak with me he told me he was busy. He refused breakfast and told the staff that he is in areligious fasting. He refused vital signs and medications. Per nurse's "Tangentially explains why he refuses meds, reporting he is Catholic, is "inflicted," and someone at his outpatient mental health provider told him he does not need to take meds."   6/29 patient has refused medications multiple times. Nonemergency medications have been ordered. The patient required a manual hold this morning in order to administer Prolixin. During assessment today he was very disorganized, hyperreligious but he was more cooperative and less argumentative than yesterday  7/2: pt seems to be improving.  He was talkative and a little more friendly than before.  He reports that he slept ok last night, but it took him a little longer than usual to fall asleep.  He is no longer fasting and went to lunch today.  Denies any depression, anxiety, or suicidal/homicidal ideations.  Speech is garble and hard to comprehend.  Patient says he is still fasting. Feels he is praying too much.  Unhappy about having to take meds, says he doesn't need them.    He wrote (scribble)all over the questioners sheets. He also wrote on 2 stere foam cups and his name tag on the room's door.  7/3 patient was uncooperative during assessment today. His speech was very difficult to understand but profanity was used constantly. Patient started crying, I was unable to understand why he was so upset. He asked to be left  the f... Alone.  He has been compliant with medications. He is slept 5.5 hours last night. She has been eating well. He states in his room all day lying in bed. No interaction with others,  no participation in group.  7/4 patient has more cooperative this morning. His speech is very difficult to understand. He talks about random  and very tangential things. Continues to be hyperreligious. Per staff patient has been sexually inappropriate making inappropriate gestures with his mouth to some of the male staff.  Today he is open to discuss administration of Prolixin decanoate.  7/5 patient appears much improved. He received Prolixin Decanoate yesterday. It is still very hard for me to understand what he is saying as his speech is garbled. Still appears that his thought process is quite disorganized as he was talking about random things. His mood continues to be labile and once in a while he'll start crying for no reason. He is leaving his room more often and has been compliant with all medications. He denies side effects, physical complaints, suicidality or homicidality. Denies hallucinations.  7/6 pleasant,  calm and cooperative. Still thought process is quite disorganized. He continues to talk about the Bible and the power of the mind. Denies side effects from medications. As far as physical complaints reports constipation. Denies problems with mood, appetite, energy is sleep or concentration. Denies suicidality, homicidality or hallucinations. He has been compliant with all of medications. No longer on nonemergency forced medications  12/09/2016 Patient  seen in his room this morning. He is cooperative with this clinician. States that he had a good night's sleep and had good breakfast. Compliant with his medications. He did not have any complaints for this clinician. However he told the nurse that he was kidnapped and brought to the unit. Continues to present disorganized.  12/10/2016 Patient seen in his room this morning. States that he is feeling all crampy all over his body. He did not get up to eat his breakfast and refuses medication. This clinician examined patient with the nurse present in the room to check for any rigidity or cogwheeling. His muscles his upper arms are quite flexible. There is no rigidity. Patient did report something wrong in his mouth. Which has been a constant complaint. Denies hearing voices. Denies any suicidal thoughts. Per nursing: Pt compliant with his medications , not logical. Fixated on something wrong with his mouth. Telling the nurse that he was kidnapped and brought here.  12/11/2016. Mr. Berthold has no complaints. He is better "no worries". He does not talk about his delusions. Denies side effects of medications, somatic complaints or problems with sleep and appetite. He is secluded to his room and does not interact with staff or peers.  12/12/2016. Mr. Rodin was irritable and unking with our staff last night, see the note. With me today he was extremely talkative and wanted to tell me everything about his family. The mother, father, two brother one of whom is his payee and his sster-in-law. He insisted that I take down all their names. Otherwise, he denies any symptoms of depression, anxiety or psychosis and wants to go home. He accepts medications and reports no side effects. There are no somatic complaints. Sleep and appetite are fair. He will likely be discharge tomorrow.  Per nursing: Pt irritable and labile.  Observed self dialoging and is vulgar when doing this.  Medication compliant. Guarded from staff and peers.   Denies s/i or h/i.  Obviously hallucinating. One out burst on night shift which he then just yelled at staff but went back to bed.  Monitored on 15 minute safety checks and maintained safety.    Principal Problem: Undifferentiated schizophrenia ([place]) Diagnosis:   Patient Active Problem List   Diagnosis Date Noted  . Undifferentiated schizophrenia ([place]) [F20.3] 11/29/2016  . Tobacco use disorder [F17.200] 11/29/2016   Total Time spent with patient: 30 minutes  Past Medical History: History reviewed. No pertinent past medical history. History reviewed. No pertinent surgical history.  Family History: History reviewed. No pertinent family history.  Family Psychiatric  History: no pertinent family hx  Social History:  History  Alcohol use Not on file     History  Drug use: Unknown    Social History   Social History  . Marital status: Single    Spouse name: N/A  . Number of children: N/A  . Years of education: N/A   Social History Main Topics  . Smoking status: Current Every Day Smoker  . Smokeless tobacco: Never Used  . Alcohol use None  . Drug use: Unknown  . Sexual activity: Not Asked  Other Topics Concern  . None   Social History Narrative  . None     Current Medications: Current Facility-Administered Medications  Medication Dose Route Frequency Provider Last Rate Last Dose  . acetaminophen (TYLENOL) tablet 650 mg  650 mg Oral Q6H PRN Charlina Dwight B, MD   650 mg at 12/03/16 0748  . alum & mag hydroxide-simeth (MAALOX/MYLANTA) 200-200-20 MG/5ML suspension 30 mL  30 mL Oral Q4H PRN Amulya Quintin B, MD      . diphenhydrAMINE (BENADRYL) capsule 25 mg  25 mg Oral QHS Hildred Priest, MD   25 mg at 12/11/16 2128  . fluPHENAZine (PROLIXIN) tablet 5 mg  5 mg Oral Daily Hildred Priest, MD   5 mg at 12/12/16 4680  . fluPHENAZine (PROLIXIN) tablet 7.5 mg  7.5 mg Oral QHS Hildred Priest, MD   7.5 mg at 12/11/16 2128  .  fluPHENAZine decanoate (PROLIXIN) injection 25 mg  25 mg Intramuscular Q21 days Hildred Priest, MD   25 mg at 12/06/16 1213  . magnesium hydroxide (MILK OF MAGNESIA) suspension 30 mL  30 mL Oral Daily PRN Laporsha Grealish B, MD   30 mL at 12/06/16 2033  . nicotine (NICODERM CQ - dosed in mg/24 hours) patch 21 mg  21 mg Transdermal Daily Hildred Priest, MD      . zolpidem (AMBIEN) tablet 10 mg  10 mg Oral QHS Hildred Priest, MD   10 mg at 12/11/16 2128    Lab Results: No results found for this or any previous visit (from the past 73 hour(s)).  Blood Alcohol level:  Lab Results  Component Value Date   ETH <5 32/05/2481    Metabolic Disorder Labs: No results found for: HGBA1C, MPG No results found for: PROLACTIN No results found for: CHOL, TRIG, HDL, CHOLHDL, VLDL, LDLCALC  Physical Findings: AIMS: Facial and Oral Movements Muscles of Facial Expression: Mild Lips and Perioral Area: Mild Jaw: Mild Tongue: Mild,Extremity Movements Upper (arms, wrists, hands, fingers): Minimal Lower (legs, knees, ankles, toes): Mild, Trunk Movements Neck, shoulders, hips: Mild, Overall Severity Severity of abnormal movements (highest score from questions above): Mild Incapacitation due to abnormal movements: Minimal Patient's awareness of abnormal movements (rate only patient's report): No Awareness, Dental Status Current problems with teeth and/or dentures?: No Does patient usually wear dentures?: No  CIWA:    COWS:     Musculoskeletal: Strength & Muscle Tone: within normal limits Gait & Station: normal Patient leans: N/A  Psychiatric Specialty Exam: Physical Exam  Vitals reviewed. Constitutional: He is oriented to person, place, and time. He appears well-developed and well-nourished.  Respiratory: Effort normal and breath sounds normal.  Neurological: He is alert and oriented to person, place, and time.  Skin: Skin is warm and dry.  Psychiatric: His  mood appears not anxious. He is not agitated. Cognition and memory are impaired. He does not exhibit a depressed mood. He expresses no homicidal and no suicidal ideation.    Review of Systems  Constitutional: Negative.   HENT: Negative.   Eyes: Negative.   Respiratory: Negative.   Cardiovascular: Negative.   Gastrointestinal: Negative.   Genitourinary: Negative.   Musculoskeletal: Negative.   Skin: Negative.   Neurological: Negative.   Endo/Heme/Allergies: Negative.   Psychiatric/Behavioral: Negative.     Blood pressure 105/89, pulse (!) 113, temperature 98.2 F (36.8 C), temperature source Oral, resp. rate 17, height 6\' 2"  (1.88 m), weight 70.3 kg (155 lb), SpO2 93 %.Body mass index is 19.9 kg/m.  General Appearance: [employer]  Contact:  Fair  Speech:  Garbled  Volume:  Normal  Mood:  Euthymic  Affect:  Appropriate  Thought Process:  Disorganized and Descriptions of Associations: Loose  Orientation:  Full (Time, Place, and Person)  Thought Content:  Delusions  Suicidal Thoughts:  No  Homicidal Thoughts:  No  Memory:  Immediate;   Poor Recent;   Poor Remote;   Poor  Judgement:  Poor  Insight:  Lacking  Psychomotor Activity:  Normal  Concentration:  Concentration: Poor and Attention Span: Poor  Recall:  Poor  Fund of Knowledge:  Poor  Language:  Poor  Akathisia:  No  Handed:   AIMS (if indicated):     Assets:  Armed forces logistics/support/administrative officer Physical Health  ADL's:  Intact  Cognition:  Impaired,  Mild  Sleep:  Number of Hours: 6.5     Treatment Plan Summary:  Pt is a 61 yo male with diagnosis of schizophrenia.  Pt is refusing to take medications and an order for nonemergency forced medications with prolixin IM was placed.    Schizoprenia: stable,  Confirmed with [facility] that pt was on prolixin deconate.  -Pt has been started on prolixin oral.  Continue Prolixin 5 mg in the morning and 7.5 po qhs (will reduce night dose due to tachycardia and hypotension)  Pt received  Prolixin decanoate 25 mg every 21 days on 7/4  Mood is improving, patient fixated on somatic complaints.  EPS: continue low dose benadryl 25 mg qhs, order 1 time dose of Ativan at 0.5 mg  Insomnia: only slept 7 h last night. Continue ambien 10 mg po qhsevening  Orthostatic hypotension and tachycardia: continues to have tachycardia.  Will decrease prolixin   Constipation: will order miralax bid  Tobacco use disorder: Continue nicotine patch  Diet: regular, discussed soft diet because patient does not have dentures, but he declined  Labs: will try to order HbA1c and lipid panel for tomorrow am.  Pt has been refusing labs.  Disposition: back home with brother once stable.   Possible discharge next week   Orson Slick, MD 12/12/2016, 5:58 PM

## 2016-12-12 NOTE — Progress Notes (Signed)
Pt appeared to sleep about 6.5 hours while monitored on 15 minute safety checks.  Up X 1 to yell at staff and speak delusions.  Otherwise slept.

## 2016-12-12 NOTE — [facility] Group Notes (Signed)
[facility] Group Notes:  (Nursing/MHT/Case Management/Adjunct)  Date:  12/12/2016  Time:  11:07 PM  Type of Therapy:  Group Therapy  Participation Level:  Active  Participation Quality:  Sharing  Affect:  Appropriate  Cognitive:  Alert  Insight:  Appropriate  Engagement in Group:  Engaged  Modes of Intervention:  Support  Summary of Progress/Problems:  Dustin [name] 12/12/2016, 11:07 PM

## 2016-12-12 NOTE — Progress Notes (Signed)
Recreation Therapy Notes  Date: 07.10.18 Time: 9:30 am Location: Craft Room  Group Topic: Goal Setting  Goal Area(s) Addresses:  Patient will write at least one goal. Patient will write at least one obstacle.  Behavioral Response: Did not attend  Intervention: Recovery Goal Chart  Activity: Patients were instructed to make a Recovery Goal Chart including their goals, obstacles, the date they started working on their goals, and the date they achieved their goals.  Education: LRT educated patients on healthy ways to celebrate reaching their goals.  Education Outcome: Patient did not attend group.  Clinical Observations/Feedback: Patient did not attend group.  Leonette Monarch, LRT/CTRS 12/12/2016 10:06 AM

## 2016-12-12 NOTE — [facility] Group Notes (Signed)
[facility] Group Notes:  (Nursing/MHT/Case Management/Adjunct)  Date:  12/12/2016  Time:  1:54 PM  Type of Therapy:  Psychoeducational Skills  Participation Level:  Did Not Attend  Adela Lank [facility] 12/12/2016, 1:54 PM

## 2016-12-12 NOTE — [facility] Group Notes (Signed)
[facility] LCSW Group Therapy Note  Date/Time:12/12/2016, 3:00pm   Type of Therapy/Topic:  Group Therapy:  Feelings about Diagnosis  Participation Level:  Minimal   Mood: Verbalizes feeling "okay"   Description of Group:    This group will allow patients to explore their thoughts and feelings about diagnoses they have received. Patients will be guided to explore their level of understanding and acceptance of these diagnoses. Facilitator will encourage patients to process their thoughts and feelings about the reactions of others to their diagnosis, and will guide patients in identifying ways to discuss their diagnosis with significant others in their lives. This group will be process-oriented, with patients participating in exploration of their own experiences as well as giving and receiving support and challenge from other group members.   Therapeutic Goals: 1. Patient will demonstrate understanding of diagnosis as evidence by identifying two or more symptoms of the disorder:  2. Patient will be able to express two feelings regarding the diagnosis 3. Patient will demonstrate ability to communicate their needs through discussion and/or role plays  Summary of Patient Progress:   Pt able to achieve therapeutic goals, difficult for him though as other group members monopolizing conversation at times.   Therapeutic Modalities:   Cognitive Behavioral Therapy Brief Therapy Feelings Identification    August Saucer, LCSW 12/12/2016, 4:26 PM

## 2016-12-13 LAB — TSH: TSH: 0.786 u[IU]/mL (ref 0.350–4.500)

## 2016-12-13 LAB — LIPID PANEL
CHOL/HDL RATIO: 3.4 ratio
CHOLESTEROL: 124 mg/dL (ref 0–200)
HDL: 37 mg/dL — ABNORMAL LOW (ref >40)
LDL Cholesterol: 65 mg/dL (ref 0–99)
Triglycerides: 112 mg/dL (ref <150)
VLDL: 22 mg/dL (ref 0–40)

## 2016-12-13 MED ORDER — DIPHENHYDRAMINE HCL 25 MG PO CAPS: 25.0000 mg | ORAL_CAPSULE | Freq: Every day | ORAL | 1 refills | Status: DC

## 2016-12-13 MED ORDER — ZOLPIDEM TARTRATE 10 MG PO TABS: 10.0000 mg | ORAL_TABLET | Freq: Every day | ORAL | 0 refills | Status: DC

## 2016-12-13 MED ORDER — FLUPHENAZINE DECANOATE 25 MG/ML IJ SOLN: 50.0000 mg | INTRAMUSCULAR | Status: DC

## 2016-12-13 MED ORDER — FLUPHENAZINE DECANOATE 25 MG/ML IJ SOLN
50.0000 mg | INTRAMUSCULAR | 1 refills | Status: DC
Start: 2017-01-03 — End: 2017-01-25

## 2016-12-13 MED ORDER — FLUPHENAZINE DECANOATE 25 MG/ML IJ SOLN
25.0000 mg | Freq: Once | INTRAMUSCULAR | Status: AC
  Administered 2016-12-13: 25 mg via INTRAMUSCULAR
  Filled 2016-12-13 (×2): qty 1

## 2016-12-13 NOTE — Discharge Summary (Addendum)
Physician Discharge Summary Note  Patient:  Dustin [name] is an 61 y.o., male MRN:  [id number] DOB:  12/05/1955 Patient phone:  [phone] (home)  Patient address:   [place] [address],  Total Time spent with patient: 30 minutes  Date of Admission:  11/30/2016 Date of Discharge: 12/13/2016  Reason for Admission:  Psychotic break.  Dustin [name] an 61 y.o.male with Schizophreniapresented to our ER under IVC from [place] on 11/28/16. According to the IVC, patient has not been taking his medications and responding responding to voices such as demons, and getting agitated and yelling at neighbors.   Pt was seen on 6/26 at [facility] for his dose of Invega. Staff from [facility] ([name], RN) reported patient had been receiving Invega through [facility] but was not able to get his injections due to [facility] not having anyone to administer. [name] states that patient came to [facility] several times to get his injections. She reports that pt will not be able to continue receiving his injection through [facility]. [name] also reports pt is hypereligious and has been making sexually inappropriate statements to male staff.   While in the ER he was agitated and aggressive. He was very disorganized.  Today he refused to speak with me he told me he was busy. He refused breakfast and told the staff that he is in a religious fasting. He refused vital signs and medications. Per nurse's "Tangentially explains why he refuses meds, reporting he is Catholic, is "inflicted," and someone at his outpatient mental health provider told him he does not need to take meds."  Substance abuse denies. Urine toxicology negative. Alcohol below the detection limit. Patient is a heavy smoker.  Associated Signs/Symptoms: Depression Symptoms:  insomnia, psychomotor agitation, anxiety, (Hypo) Manic Symptoms:  Delusions, Distractibility, Hallucinations, Impulsivity,  Irritable Mood, Anxiety Symptoms:  Excessive Worry, Psychotic Symptoms:  Delusions, Hallucinations: Auditory, Paranoia, PTSD Symptoms: NA  Past Psychiatric History: Patient was seeing a psychiatrist at [facility]. He has been treated in the past with Prolixin injectable  Family History: History reviewed. No pertinent family history.   Social history. He is disabled from mental illness. He lives with his brother and sister-in-law. His other brother is a Manufacturing systems engineer.  Principal Problem: Undifferentiated schizophrenia [facility]) Discharge Diagnoses: Patient Active Problem List   Diagnosis Date Noted  . Undifferentiated schizophrenia ([place]) [F20.3] 11/29/2016  . Tobacco use disorder [F17.200] 11/29/2016   Past Medical History: History reviewed. No pertinent past medical history. History reviewed. No pertinent surgical history. Family History: History reviewed. No pertinent family history.  Social History:  History  Alcohol use Not on file     History  Drug use: Unknown    Social History   Social History  . Marital status: Single    Spouse name: N/A  . Number of children: N/A  . Years of education: N/A   Social History Main Topics  . Smoking status: Current Every Day Smoker  . Smokeless tobacco: Never Used  . Alcohol use None  . Drug use: Unknown  . Sexual activity: Not Asked   Other Topics Concern  . None   Social History Narrative  . None    Hospital Course:    [name] is a 61 year old male with a diagnosis of schizophrenia admitted floridly psychotic in the context of medication noncompliance.    1. Schizoprenia. The patient has been maintained on Prolixin injections but has not been  compliant. Per family, he refuses any oral medications. He was given Prolixin decanoate 25 mg on 7/4 and another 25 mg at discharged on 7/11. We recommend 50 mg of Prolixin decanoate every 2-3 weeks. No oral prolixin is prescribed for home. I will put this patient on outpatient  involuntary psychiatric commitment.   2. EPS. Continue low dose benadryl at night.  3. Insomnia. Slept better with Ambien.  4. Tobacco use disorder. Continue nicotine patch  5. Metabolic syndrome monitoring. Lipid panel and TSH are normal, HgbA1C pending.  6. EKG. QTc 427.  7. Disposition. He will be discharged with family. He will follow up with [facility].     Physical Findings: AIMS: Facial and Oral Movements Muscles of Facial Expression: Minimal Lips and Perioral Area: Mild Jaw: Mild Tongue: None, normal,Extremity Movements Upper (arms, wrists, hands, fingers): None, normal Lower (legs, knees, ankles, toes): Mild, Trunk Movements Neck, shoulders, hips: None, normal, Overall Severity Severity of abnormal movements (highest score from questions above): Mild Incapacitation due to abnormal movements: None, normal Patient's awareness of abnormal movements (rate only patient's report): No Awareness, Dental Status Current problems with teeth and/or dentures?: No Does patient usually wear dentures?: No  CIWA:    COWS:     Musculoskeletal: Strength & Muscle Tone: within normal limits Gait & Station: normal Patient leans: N/A  Psychiatric Specialty Exam: Physical Exam  Nursing note and vitals reviewed. Psychiatric: He has a normal mood and affect. His speech is normal. Thought content normal. He is actively hallucinating. Cognition and memory are normal. He expresses impulsivity.    Review of Systems  Psychiatric/Behavioral: Positive for hallucinations.  All other systems reviewed and are negative.   Blood pressure 119/83, pulse 96, temperature (!) 97.5 F (36.4 C), temperature source Oral, resp. rate 18, height 6\' 2"  (1.88 m), weight 70.3 kg (155 lb), SpO2 100 %.Body mass index is 19.9 kg/m.  General Appearance: Casual  Eye Contact:  Good  Speech:  Clear and Coherent  Volume:  Normal  Mood:  Euthymic  Affect:  Appropriate  Thought Process:  Goal Directed and  Descriptions of Associations: Intact  Orientation:  Full (Time, Place, and Person)  Thought Content:  Hallucinations: Auditory  Suicidal Thoughts:  No  Homicidal Thoughts:  No  Memory:  Immediate;   Fair Recent;   Fair Remote;   Fair  Judgement:  Poor  Insight:  Lacking  Psychomotor Activity:  Normal  Concentration:  Concentration: Fair and Attention Span: Fair  Recall:  [employer] of Knowledge:  Fair  Language:  Fair  Akathisia:  No  Handed:  Right  AIMS (if indicated):     Assets:  Communication Skills Desire for Improvement Financial Resources/Insurance Housing Physical Health Resilience Social Support  ADL's:  Intact  Cognition:  WNL  Sleep:  Number of Hours: 7.25     Have you used any form of tobacco in the last 30 days? (Cigarettes, Smokeless Tobacco, Cigars, and/or Pipes): Yes  Has this patient used any form of tobacco in the last 30 days? (Cigarettes, Smokeless Tobacco, Cigars, and/or Pipes) Yes, Yes, A prescription for an FDA-approved tobacco cessation medication was offered at discharge and the patient refused  Blood Alcohol level:  Lab Results  Component Value Date   ETH <5 77/93/9030    Metabolic Disorder Labs:  No results found for: HGBA1C, MPG No results found for: PROLACTIN No results found for: CHOL, TRIG, HDL, CHOLHDL, VLDL, LDLCALC  See Psychiatric Specialty Exam and Suicide Risk Assessment completed by Attending  Physician prior to discharge.  Discharge destination:  Home  Is patient on multiple antipsychotic therapies at discharge:  No   Has Patient had three or more failed trials of antipsychotic monotherapy by history:  No  Recommended Plan for Multiple Antipsychotic Therapies: NA  Discharge Instructions    Diet - low sodium heart healthy    Complete by:  As directed    Increase activity slowly    Complete by:  As directed      Allergies as of 12/13/2016   No Known Allergies     Medication List    STOP taking these medications    acetaminophen 500 MG tablet Commonly known as:  TYLENOL   fluPHENAZine 2.5 MG/ML injection Commonly known as:  PROLIXIN   multivitamin capsule     TAKE these medications     Indication  diphenhydrAMINE 25 mg capsule Commonly known as:  BENADRYL Take 1 capsule (25 mg total) by mouth at bedtime.  Indication:  Extrapyramidal Reaction   fluPHENAZine decanoate 25 MG/ML injection Commonly known as:  PROLIXIN Inject 2 mLs (50 mg total) into the muscle every 14 (fourteen) days. Start taking on:  01/03/2017  Indication:  Schizophrenia   zolpidem 10 MG tablet Commonly known as:  AMBIEN Take 1 tablet (10 mg total) by mouth at bedtime.  Indication:  Trouble Sleeping      Follow-up Information    [facility], [facility]. Go on 12/19/2016.   Why:  Please attend your follow up appointment at [facility] on Tuesday, 12/19/16.  Please arrive at 1:30pm and bring a photo ID, copy of your insurance card, and your hospital discharge paperwork. Contact information: [address] [place] [address] [phone]           Follow-up recommendations:  Activity:  as tolerated. Diet:  low sodium heart healthy.  Other:  keep follow up appointments.  Comments:     Signed: Orson Slick, MD 12/13/2016, 9:41 AM

## 2016-12-13 NOTE — Plan of Care (Signed)
Problem: Activity: Goal: Sleeping patterns will improve Outcome: Progressing Sleeping well  Problem: Education: Goal: Knowledge of [place] [facility] Education information/materials will improve Outcome: Not Progressing Not able to focus on this

## 2016-12-13 NOTE — Progress Notes (Addendum)
CSW spoke with pt brother, Jacklynn Barnacle, regarding discharge today.  Brother reports that pt has a history of both refusing to take his medicine and also refusing to attend his outpt appointments.  Pt actually lives with a different brother who is mute but is capable of reminding pt to take his medicine, however, that doesn't help if pt is just refusing to do so.  [place] said pt did have some problems at [facility] leading to him having to start going to [facility], but brother feels like additional support is needed to maintain brother after discharge. Winferd Humphrey, MSW, LCSW Clinical Social Worker 12/13/2016 8:24 AM   CSW spoke to Dr Mamie Nick regarding concerns: she is going to fill out outpt commitment for this pt and, along with injections, this should improve medication compliance.  CSW called Carstello and explained this plan to him.  He is still a little concerned but willing to give this plan a try. Winferd Humphrey, MSW, LCSW Clinical Social Worker 12/13/2016 9:46 AM

## 2016-12-13 NOTE — Progress Notes (Signed)
Recreation Therapy Notes  Date: 07.11.18 Time: 9:30 am Location: Craft Room  Group Topic: Self-esteem  Goal Area(s) Addresses:  Patient will write at least one positive trait about self. Patient will verbalize benefit of having a healthy self-esteem.  Behavioral Response: Did not attend  Intervention: I Am  Activity: Patients were given a worksheet with the letter I on it and were instructed to write as many positive traits about themselves inside the letter.  Education: LRT educated patients on ways to increase their self-esteem.  Education Outcome: Patient did not attend group.  Clinical Observations/Feedback: Patient did not attend group.  Leonette Monarch, LRT/CTRS 12/13/2016 10:01 AM

## 2016-12-13 NOTE — Progress Notes (Signed)
Pt continues isolative and irritable though he appears to be angry with his hallucinations as he swears to himself under his breath.  Medication compliant.  Denies s/i or h/i and has maintained safety while monitored on 15 minute safety checks.

## 2016-12-13 NOTE — [facility] Group Notes (Signed)
[facility] LCSW Group Therapy   12/13/2016  1:00 pm   Type of Therapy: Group Therapy   Participation Level: Patient invited but did not attend.    Glorious Peach, MSW, LCSWA 12/13/2016, 2:12PM

## 2016-12-13 NOTE — Progress Notes (Signed)
Pt denies SI, HI, a/v hallucinations. Pt commits to safety for discharge. Follow up appointments, discharge medications, and treatment reviewed. Pt verbalized understanding of discharge instructions. All belongings returned to pt upon discharge. Pt picked up by brother pt given valuables out of safe. RN walked pt and brother to visitors parking lot

## 2016-12-13 NOTE — Progress Notes (Signed)
Pt appeared to sleep about 7.25 hours while monitored on 15 minute safety checks.

## 2016-12-13 NOTE — Progress Notes (Signed)
  [facility] Adult Case Management Discharge Plan :  Will you be returning to the same living situation after discharge:  Yes,  with brother At discharge, do you have transportation home?: Yes,  other brother Do you have the ability to pay for your medications: Yes,  medicare/medicaid  Release of information consent forms completed and in the chart;  Patient's signature needed at discharge.  Patient to Follow up at: Follow-up Information    [facility], [facility]. Go on 12/19/2016.   Why:  Please attend your follow up appointment at [facility] on Tuesday, 12/19/16.  Please arrive at 1:30pm and bring a photo ID, copy of your insurance card, and your hospital discharge paperwork. Contact information: [place] [address] [phone]           Next level of care provider has access to [place] and Suicide Prevention discussed: Yes,  with brother  Have you used any form of tobacco in the last 30 days? (Cigarettes, Smokeless Tobacco, Cigars, and/or Pipes): Yes  Has patient been referred to the Quitline?: Patient refused referral  Patient has been referred for addiction treatment: Yes  Joanne Chars, LCSW 12/13/2016, 11:55 AM

## 2016-12-13 NOTE — [facility]HH Suicide Risk Assessment (Signed)
[facility] Discharge Suicide Risk Assessment   Principal Problem: Undifferentiated schizophrenia [facility]) Discharge Diagnoses:  Patient Active Problem List   Diagnosis Date Noted  . Undifferentiated schizophrenia ([place]) [F20.3] 11/29/2016  . Tobacco use disorder [F17.200] 11/29/2016    Total Time spent with patient: 30 minutes  Musculoskeletal: Strength & Muscle Tone: within normal limits Gait & Station: normal Patient leans: N/A  Psychiatric Specialty Exam: Review of Systems  Psychiatric/Behavioral: Positive for hallucinations.  All other systems reviewed and are negative.   Blood pressure 119/83, pulse 96, temperature (!) 97.5 F (36.4 C), temperature source Oral, resp. rate 18, height 6\' 2"  (1.88 m), weight 70.3 kg (155 lb), SpO2 100 %.Body mass index is 19.9 kg/m.  General Appearance: Casual  Eye Contact::  Good  Speech:  Clear and Coherent409  Volume:  Normal  Mood:  Euthymic  Affect:  Appropriate  Thought Process:  Goal Directed and Descriptions of Associations: Intact  Orientation:  Full (Time, Place, and Person)  Thought Content:  Hallucinations: Auditory  Suicidal Thoughts:  No  Homicidal Thoughts:  No  Memory:  Immediate;   Fair Recent;   Fair Remote;   Fair  Judgement:  Poor  Insight:  Lacking  Psychomotor Activity:  Normal  Concentration:  Fair  Recall:  [place]  Language: Fair  Akathisia:  No  Handed:  Right  AIMS (if indicated):     Assets:  Communication Skills Desire for Improvement Financial Resources/Insurance Housing Physical Health Resilience Social Support  Sleep:  Number of Hours: 7.25  Cognition: WNL  ADL's:  Intact   Mental Status Per Nursing Assessment::   On Admission:     Demographic Factors:  Male  Loss Factors: NA  Historical Factors: Family history of mental illness or substance abuse and Impulsivity  Risk Reduction Factors:   Sense of responsibility to family, Living with another person, especially a  relative and Positive social support  Continued Clinical Symptoms:  Schizophrenia:   Paranoid or undifferentiated type  Cognitive Features That Contribute To Risk:  None    Suicide Risk:  Minimal: No identifiable suicidal ideation.  Patients presenting with no risk factors but with morbid ruminations; may be classified as minimal risk based on the severity of the depressive symptoms  Follow-up Information    [facility], [facility]. Go on 12/19/2016.   Why:  Please attend your follow up appointment at [facility] on Tuesday, 12/19/16.  Please arrive at 1:30pm and bring a photo ID, copy of your insurance card, and your hospital discharge paperwork. Contact information: [place] [address] [phone]           Plan Of Care/Follow-up recommendations:  Activity:  as tolerated. Diet:  low sodium heart healthy. Other:  keep follow up appointments.  Orson Slick, MD 12/13/2016, 9:38 AM

## 2016-12-14 LAB — HEMOGLOBIN A1C
HEMOGLOBIN A1C: 5.6 % (ref 4.8–5.6)
MEAN PLASMA GLUCOSE: 114 mg/dL

## 2017-01-11 ENCOUNTER — Emergency Department
Admission: EM | Admit: 2017-01-11 | Discharge: 2017-01-11 | Disposition: A | Discharge status: Home / Self Care | Payer: Medicare Other | Attending: Emergency Medicine | Admitting: Emergency Medicine

## 2017-01-11 ENCOUNTER — Encounter: Payer: Self-pay | Admitting: Emergency Medicine

## 2017-01-11 ENCOUNTER — Inpatient Hospital Stay
Admission: AD | Admit: 2017-01-11 | Discharge: 2017-01-25 | DRG: 885 | Disposition: A | Discharge status: Home / Self Care | Payer: Medicare Other | Attending: Psychiatry | Admitting: Psychiatry

## 2017-01-11 DIAGNOSIS — F209 Schizophrenia, unspecified: Secondary | ICD-10-CM | POA: Diagnosis not present

## 2017-01-11 DIAGNOSIS — Z79899 Other long term (current) drug therapy: Secondary | ICD-10-CM | POA: Insufficient documentation

## 2017-01-11 DIAGNOSIS — R451 Restlessness and agitation: Secondary | ICD-10-CM | POA: Diagnosis not present

## 2017-01-11 DIAGNOSIS — X58XXXA Exposure to other specified factors, initial encounter: Secondary | ICD-10-CM | POA: Diagnosis present

## 2017-01-11 DIAGNOSIS — G2401 Drug induced subacute dyskinesia: Secondary | ICD-10-CM | POA: Diagnosis not present

## 2017-01-11 DIAGNOSIS — F203 Undifferentiated schizophrenia: Secondary | ICD-10-CM | POA: Diagnosis present

## 2017-01-11 DIAGNOSIS — F17200 Nicotine dependence, unspecified, uncomplicated: Secondary | ICD-10-CM | POA: Diagnosis not present

## 2017-01-11 DIAGNOSIS — F419 Anxiety disorder, unspecified: Secondary | ICD-10-CM | POA: Diagnosis present

## 2017-01-11 DIAGNOSIS — F17210 Nicotine dependence, cigarettes, uncomplicated: Secondary | ICD-10-CM | POA: Diagnosis present

## 2017-01-11 DIAGNOSIS — F39 Unspecified mood [affective] disorder: Secondary | ICD-10-CM | POA: Diagnosis not present

## 2017-01-11 DIAGNOSIS — R4182 Altered mental status, unspecified: Secondary | ICD-10-CM | POA: Diagnosis present

## 2017-01-11 DIAGNOSIS — F23 Brief psychotic disorder: Secondary | ICD-10-CM | POA: Diagnosis not present

## 2017-01-11 DIAGNOSIS — F250 Schizoaffective disorder, bipolar type: Secondary | ICD-10-CM | POA: Diagnosis present

## 2017-01-11 DIAGNOSIS — K5903 Drug induced constipation: Secondary | ICD-10-CM | POA: Diagnosis not present

## 2017-01-11 DIAGNOSIS — K649 Unspecified hemorrhoids: Secondary | ICD-10-CM | POA: Diagnosis present

## 2017-01-11 DIAGNOSIS — Z91138 Patient's unintentional underdosing of medication regimen for other reason: Secondary | ICD-10-CM

## 2017-01-11 DIAGNOSIS — G4700 Insomnia, unspecified: Secondary | ICD-10-CM | POA: Diagnosis present

## 2017-01-11 DIAGNOSIS — T433X6A Underdosing of phenothiazine antipsychotics and neuroleptics, initial encounter: Secondary | ICD-10-CM | POA: Diagnosis present

## 2017-01-11 DIAGNOSIS — F201 Disorganized schizophrenia: Secondary | ICD-10-CM | POA: Diagnosis not present

## 2017-01-11 HISTORY — DX: Schizophrenia, unspecified: F20.9

## 2017-01-11 LAB — COMPREHENSIVE METABOLIC PANEL
ALT: 12 U/L — ABNORMAL LOW (ref 17–63)
ANION GAP: 9 (ref 5–15)
AST: 22 U/L (ref 15–41)
Albumin: 4 g/dL (ref 3.5–5.0)
Alkaline Phosphatase: 70 U/L (ref 38–126)
BILIRUBIN TOTAL: 0.5 mg/dL (ref 0.3–1.2)
BUN: 14 mg/dL (ref 6–20)
CHLORIDE: 109 mmol/L (ref 101–111)
CO2: 21 mmol/L — ABNORMAL LOW (ref 22–32)
Calcium: 9.2 mg/dL (ref 8.9–10.3)
Creatinine, Ser: 1.74 mg/dL — ABNORMAL HIGH (ref 0.61–1.24)
GFR calc Af Amer: 47 mL/min — ABNORMAL LOW (ref >60)
GFR, EST NON AFRICAN AMERICAN: 41 mL/min — AB (ref >60)
GLUCOSE: 175 mg/dL — AB (ref 65–99)
POTASSIUM: 3.2 mmol/L — AB (ref 3.5–5.1)
Sodium: 139 mmol/L (ref 135–145)
TOTAL PROTEIN: 7.1 g/dL (ref 6.5–8.1)

## 2017-01-11 LAB — CBC
HEMATOCRIT: 45 % (ref 40.0–52.0)
HEMOGLOBIN: 14.9 g/dL (ref 13.0–18.0)
MCH: 27.9 pg (ref 26.0–34.0)
MCHC: 33.1 g/dL (ref 32.0–36.0)
MCV: 84.4 fL (ref 80.0–100.0)
PLATELETS: 177 10*3/uL (ref 150–440)
RBC: 5.33 MIL/uL (ref 4.40–5.90)
RDW: 14.1 % (ref 11.5–14.5)
WBC: 10.4 10*3/uL (ref 3.8–10.6)

## 2017-01-11 LAB — ACETAMINOPHEN LEVEL

## 2017-01-11 LAB — ETHANOL: Alcohol, Ethyl (B): <5 mg/dL (ref <5)

## 2017-01-11 LAB — SALICYLATE LEVEL: Salicylate Lvl: <7 mg/dL (ref 2.8–30.0)

## 2017-01-11 MED ORDER — FLUPHENAZINE DECANOATE 25 MG/ML IJ SOLN
50.0000 mg | Freq: Once | INTRAMUSCULAR | Status: AC
Start: 2017-01-11 — End: 2017-01-11
  Administered 2017-01-11: 50 mg via INTRAMUSCULAR
  Filled 2017-01-11: qty 2

## 2017-01-11 MED ORDER — DIPHENHYDRAMINE HCL 50 MG/ML IJ SOLN
50.0000 mg | Freq: Once | INTRAMUSCULAR | Status: AC
  Administered 2017-01-11: 50 mg via INTRAMUSCULAR
  Filled 2017-01-11: qty 1

## 2017-01-11 MED ORDER — LORAZEPAM 2 MG/ML IJ SOLN
2.0000 mg | Freq: Once | INTRAMUSCULAR | Status: AC
  Administered 2017-01-11: 2 mg via INTRAMUSCULAR
  Filled 2017-01-11: qty 1

## 2017-01-11 MED ORDER — ZOLPIDEM TARTRATE 5 MG PO TABS: 10.0000 mg | ORAL_TABLET | Freq: Every day | ORAL | Status: DC

## 2017-01-11 MED ORDER — FLUPHENAZINE HCL 5 MG PO TABS: 5.0000 mg | ORAL_TABLET | Freq: Three times a day (TID) | ORAL | Status: DC | PRN

## 2017-01-11 MED ORDER — ALUM & MAG HYDROXIDE-SIMETH 200-200-20 MG/5ML PO SUSP: 30.0000 mL | ORAL | Status: DC | PRN

## 2017-01-11 MED ORDER — FLUPHENAZINE HCL 5 MG PO TABS
5.0000 mg | ORAL_TABLET | Freq: Three times a day (TID) | ORAL | Status: DC | PRN
  Filled 2017-01-11: qty 1

## 2017-01-11 MED ORDER — TRAZODONE HCL 100 MG PO TABS: 100.0000 mg | ORAL_TABLET | Freq: Every evening | ORAL | Status: DC | PRN

## 2017-01-11 MED ORDER — MAGNESIUM HYDROXIDE 400 MG/5ML PO SUSP
30.0000 mL | Freq: Every day | ORAL | Status: DC | PRN
  Administered 2017-01-17 – 2017-01-21 (×2): 30 mL via ORAL
  Filled 2017-01-11 (×2): qty 30

## 2017-01-11 MED ORDER — HYDROXYZINE HCL 50 MG PO TABS: 50.0000 mg | ORAL_TABLET | Freq: Three times a day (TID) | ORAL | Status: DC | PRN

## 2017-01-11 MED ORDER — HALOPERIDOL LACTATE 5 MG/ML IJ SOLN
5.0000 mg | Freq: Once | INTRAMUSCULAR | Status: AC
  Administered 2017-01-11: 5 mg via INTRAMUSCULAR
  Filled 2017-01-11: qty 1

## 2017-01-11 MED ORDER — ACETAMINOPHEN 325 MG PO TABS
650.0000 mg | ORAL_TABLET | Freq: Four times a day (QID) | ORAL | Status: DC | PRN
  Administered 2017-01-21: 650 mg via ORAL
  Filled 2017-01-11 (×2): qty 2

## 2017-01-11 NOTE — ED Provider Notes (Signed)
[facility] Emergency Department Provider Note  ____________________________________________  Time seen: Approximately 9:58 PM  I have reviewed the triage vital signs and the nursing notes.   HISTORY  Chief Complaint Mental Health Problem  Level 5 Caveat: Portions of the History and Physical were unable to be obtained due to altered mental status.   HPI Dustin [name] is a 61 y.o. male brought to the ED under involuntary commitment, reportedly due to trying to insight police to kill him. The involuntary commitment petition does not offer any additional details.  According to patient he was at home when police came and entered his home. He does not know why they came. He makes repetitive religious references and reports that his aunt is working with a devil Dr. He endorses recent auditory hallucinations but denies any currently. Denies SI HI or visual hallucinations. Denies taking any antipsychotic or mood stabilizing medications     Past Medical History:  Diagnosis Date  . Schizophrenia [facility])      Patient Active Problem List   Diagnosis Date Noted  . Undifferentiated schizophrenia ([place]) 11/29/2016  . Tobacco use disorder 11/29/2016     History reviewed. No pertinent surgical history.   Prior to Admission medications   Medication Sig Start Date End Date Taking? Authorizing Provider  diphenhydrAMINE (BENADRYL) 25 mg capsule Take 1 capsule (25 mg total) by mouth at bedtime. 12/13/16   Pucilowska, Herma Ard B, MD  fluPHENAZine decanoate (PROLIXIN) 25 MG/ML injection Inject 2 mLs (50 mg total) into the muscle every 14 (fourteen) days. 01/03/17   Pucilowska, Jolanta B, MD  zolpidem (AMBIEN) 10 MG tablet Take 1 tablet (10 mg total) by mouth at bedtime. 12/13/16   Pucilowska, Wardell Honour, MD     Allergies Patient has no known allergies.   No family history on file.  Social History Social History  Substance Use Topics  . Smoking status: Current Every Day  Smoker  . Smokeless tobacco: Never Used  . Alcohol use Not on file    Review of Systems  Constitutional:   No fever or chills.  ENT:   No sore throat. No rhinorrhea. Cardiovascular:   No chest pain or syncope. Respiratory:   No dyspnea or cough. Gastrointestinal:   Negative for abdominal pain, vomiting and diarrhea.  Musculoskeletal:   Negative for focal pain or swelling All other systems reviewed and are negative except as documented above in ROS and HPI.  ____________________________________________   PHYSICAL EXAM:  VITAL SIGNS: ED Triage Vitals  Enc Vitals Group     BP 01/11/17 1905 (!) 141/114     Pulse Rate 01/11/17 1905 (!) 135     Resp 01/11/17 1905 18     Temp 01/11/17 2149 98.3 F (36.8 C)     Temp Source 01/11/17 1905 Oral     SpO2 01/11/17 1905 97 %     Weight --      Height --      Head Circumference --      Peak Flow --      Pain Score --      Pain Loc --      Pain Edu? --      Excl. in [place]? --     Vital signs reviewed, nursing assessments reviewed.   Constitutional:   Alert and oriented To person and place. Well appearing and in no distress. Eyes:   No scleral icterus.  EOMI. No nystagmus. No conjunctival pallor. PERRL. ENT   Head:   Normocephalic  and atraumatic.   Nose:   No congestion/rhinnorhea.    Mouth/Throat:   MMM, no pharyngeal erythema. No peritonsillar mass.    Neck:   No meningismus. Full ROM Hematological/Lymphatic/Immunilogical:   No cervical lymphadenopathy. Cardiovascular:   RRR. Symmetric bilateral radial and DP pulses.  No murmurs.  Respiratory:   Normal respiratory effort without tachypnea/retractions. Breath sounds are clear and equal bilaterally. No wheezes/rales/rhonchi. Gastrointestinal:   Soft and nontender. Non distended. There is no CVA tenderness.  No rebound, rigidity, or guarding. Genitourinary:   deferred Musculoskeletal:   Normal range of motion in all extremities. No joint effusions.  No lower extremity  tenderness.  No edema. Neurologic:   Normal speech and language.  Motor grossly intact. No gross focal neurologic deficits are appreciated.  Skin:    Skin is warm, dry and intact. No rash noted.  No petechiae, purpura, or bullae.  ____________________________________________    LABS (pertinent positives/negatives) (all labs ordered are listed, but only abnormal results are displayed) Labs Reviewed  COMPREHENSIVE METABOLIC PANEL - Abnormal; Notable for the following:       Result Value   Potassium 3.2 (*)    CO2 21 (*)    Glucose, Bld 175 (*)    Creatinine, Ser 1.74 (*)    ALT 12 (*)    GFR calc non Af Amer 41 (*)    GFR calc Af Amer 47 (*)    All other components within normal limits  ACETAMINOPHEN LEVEL - Abnormal; Notable for the following:    Acetaminophen (Tylenol), Serum <10 (*)    All other components within normal limits  ETHANOL  SALICYLATE LEVEL  CBC  URINE DRUG SCREEN, QUALITATIVE ([facility] ONLY)   ____________________________________________   EKG    ____________________________________________    RADIOLOGY  No results found.  ____________________________________________   PROCEDURES Procedures CRITICAL CARE Performed by: Joni Fears, Kaiyon Hynes   Total critical care time: 35 minutes  Critical care time was exclusive of separately billable procedures and treating other patients.  Critical care was necessary to treat or prevent imminent or life-threatening deterioration.  Critical care was time spent personally by me on the following activities: development of treatment plan with patient and/or surrogate as well as nursing, discussions with consultants, evaluation of patient's response to treatment, examination of patient, obtaining history from patient or surrogate, ordering and performing treatments and interventions, ordering and review of laboratory studies, ordering and review of radiographic studies, pulse oximetry and re-evaluation of patient's  condition.  ____________________________________________   INITIAL IMPRESSION / ASSESSMENT AND PLAN / ED COURSE  Pertinent labs & imaging results that were available during my care of the patient were reviewed by me and considered in my medical decision making (see chart for details).    Clinical Course as of Jan 11 2202  Thu Jan 11, 2017  1934 Pt clearly psychotic, very agitated and uncooperative. Will require calming and immediate initial treatment of his psychosis with haldol, ativan, benadryl.  [PS]    Clinical Course User Index [PS] Carrie Mew, MD    ----------------------------------------- 10:03 PM on 01/11/2017 -----------------------------------------  Medications giving for treatment of his acute psychosis and agitation were effective and the patient has mostly been cooperative and intermittently sleeping. Vital signs remain stable. No respiratory depression. Discussed with psychiatry Dr. Weber Cooks in the ED after his assessment he feels the patient is psychotic and required hospitalization for stabilization. Patient is now medically stable to proceed with psychiatric treatment. Further pharmacological management for his schizophrenia ordered by Dr. Weber Cooks and  will proceed with psychiatry team.   ____________________________________________   FINAL CLINICAL IMPRESSION(S) / ED DIAGNOSES  Final diagnoses:  Acute psychosis      New Prescriptions   No medications on file     Portions of this note were generated with dragon dictation software. Dictation errors may occur despite best attempts at proofreading.    Carrie Mew, MD 01/11/17 2203

## 2017-01-11 NOTE — ED Triage Notes (Addendum)
Patient ambulatory to triage with steady gait, without difficulty or distress noted, in custody of Therapist, nutritional for [employer]; pt st unaware why he is here; papers indicate pt threatened officers; pt st that he has schizophrenia with auditory hallucinations but denies at present; pt talking incessantly to himself, cursing and speaking of "violent schizophrenia"; becomes agitated initially with blood draw but allows with calming measures

## 2017-01-11 NOTE — BH Assessment (Signed)
Per Dr. Weber Cooks, patient meets criteria for inpatient admission.  Case reviewed with [facility] Charge Nurse Britt Bolognese.  Patient has been accepted for admission. Bed assignment:  310 Attending MD:  Dr. Jerilee Hoh

## 2017-01-11 NOTE — ED Notes (Signed)
Patient changed into burgundy scrubs by Kandee Keen RN

## 2017-01-11 NOTE — ED Notes (Signed)
Patient uncooperative and aggressive verbally. Pt verbalizing delusions of persecution against the ED staff.  Pt refusing to change into burgundy scrubs and refusing medications at this time.

## 2017-01-11 NOTE — ED Notes (Signed)
Medications administer IM.  Officer's at bedside.  Pt did not become physically aggressive during administration.  Pt reported that he did not want [place] to poison him. Patient reassurred by this RN and by Raquel RN that we were not administering poison, that he was simply being given medication to calm him down.  Pt stayed still for medication administration.
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
## 2017-01-11 NOTE — ED Notes (Signed)
Pt was given a meal tray and a drink. Pt st, im not hungry right now. im really sleepy. Pt showed slight aggression upon arousal but was cooperative with me obtaining VS. ODS security officer present at this time for safety reasons. Pt calmly lying in bed asleep, nothing needed from staff at this time. Pt was re-reminded that we do need a urine specimen when able

## 2017-01-12 ENCOUNTER — Encounter: Payer: Self-pay | Admitting: Psychiatry

## 2017-01-12 LAB — HEMOGLOBIN A1C
HEMOGLOBIN A1C: 5.3 % (ref 4.8–5.6)
MEAN PLASMA GLUCOSE: 105.41 mg/dL

## 2017-01-12 LAB — LIPID PANEL
CHOL/HDL RATIO: 2.9 ratio
Cholesterol: 149 mg/dL (ref 0–200)
HDL: 51 mg/dL (ref >40)
LDL Cholesterol: 88 mg/dL (ref 0–99)
Triglycerides: 48 mg/dL (ref <150)
VLDL: 10 mg/dL (ref 0–40)

## 2017-01-12 LAB — TSH: TSH: 1.808 u[IU]/mL (ref 0.350–4.500)

## 2017-01-12 MED ORDER — DIVALPROEX SODIUM 500 MG PO DR TAB
500.0000 mg | DELAYED_RELEASE_TABLET | Freq: Two times a day (BID) | ORAL | Status: DC
  Administered 2017-01-12 – 2017-01-25 (×26): 500 mg via ORAL
  Filled 2017-01-12 (×26): qty 1

## 2017-01-12 MED ORDER — FLUPHENAZINE HCL 5 MG/ML PO CONC
5.0000 mg | Freq: Two times a day (BID) | ORAL | Status: DC
  Filled 2017-01-12: qty 1

## 2017-01-12 MED ORDER — FLUPHENAZINE DECANOATE 25 MG/ML IJ SOLN: 50.0000 mg | INTRAMUSCULAR | Status: DC

## 2017-01-12 MED ORDER — LORAZEPAM 2 MG/ML IJ SOLN: 2.0000 mg | INTRAMUSCULAR | Status: DC | PRN

## 2017-01-12 MED ORDER — FLUPHENAZINE HCL 5 MG PO TABS
5.0000 mg | ORAL_TABLET | Freq: Two times a day (BID) | ORAL | Status: DC
  Administered 2017-01-12 – 2017-01-17 (×10): 5 mg via ORAL
  Filled 2017-01-12 (×10): qty 1

## 2017-01-12 MED ORDER — FLUPHENAZINE HCL 5 MG PO TABS
5.0000 mg | ORAL_TABLET | Freq: Two times a day (BID) | ORAL | Status: DC
  Filled 2017-01-12: qty 1

## 2017-01-12 MED ORDER — LORAZEPAM 2 MG PO TABS
2.0000 mg | ORAL_TABLET | ORAL | Status: DC | PRN
  Administered 2017-01-15: 2 mg via ORAL
  Filled 2017-01-12: qty 1

## 2017-01-12 MED ORDER — LORAZEPAM 2 MG PO TABS
2.0000 mg | ORAL_TABLET | Freq: Every day | ORAL | Status: DC
  Administered 2017-01-13 – 2017-01-14 (×2): 2 mg via ORAL
  Filled 2017-01-12 (×3): qty 1

## 2017-01-12 MED ORDER — FLUPHENAZINE HCL 2.5 MG/ML IJ SOLN
5.0000 mg | Freq: Two times a day (BID) | INTRAMUSCULAR | Status: DC
  Filled 2017-01-12 (×11): qty 2

## 2017-01-12 MED ORDER — FLUPHENAZINE HCL 2.5 MG/ML IJ SOLN
5.0000 mg | Freq: Two times a day (BID) | INTRAMUSCULAR | Status: DC
  Filled 2017-01-12: qty 2
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
## 2017-01-12 NOTE — H&P (Addendum)
Psychiatric Admission Assessment Adult  Patient Identification: Dustin [name] MRN:  [id number] Date of Evaluation:  01/12/2017 Chief Complaint: psychosis Principal Diagnosis: <principal problem not specified> Diagnosis:   Patient Active Problem List   Diagnosis Date Noted  . Schizophrenia ([place]) [F20.9] 01/11/2017  . Undifferentiated schizophrenia ([place]) [F20.3] 11/29/2016  . Tobacco use disorder [F17.200] 11/29/2016   History of Present Illness:   Dustin [name] is an 61 y.o. male with Schizophrenia presented to our ER under IVC on 8/9. Pt was agitated, loud, uncooperative, paranoid and verbally aggressive. Per petition "trying to insight police to kill him".  According to patient he was at home when police came and entered his home. He does not know why they came. He makes repetitive religious references and reports that his aunt is working with a devil Dr. He endorses recent auditory hallucinations but denies any currently. Denies SI HI or visual hallucinations. Denies taking any antipsychotic or mood stabilizing medications.  Discharge from our unit on 7/11.  At that time he was disorganized and delusional. Place on forced meds.  Finally stabilized on prolixin po and decanoate. He was discharged back to his brother's house.    I imagine he did not f/u or take any meds after discharge.    Per nursing staff: "Patient appears irritable and angry using profanities, affect is blunted, patient was not calm during admission process, he kept using vulgar words and sexual explicit terms and was often told to be mindful of such words. Patient  denies SI/AVH but noted responding to internal stimuli. Patient's thoughts are disorganized, speech is pressured and tangential."   Trauma: unknown   Substance abuse denies. Urine toxicology negative. Alcohol below the detection limit. Patient is a heavy smoker.  Associated Signs/Symptoms: Depression Symptoms:  insomnia, psychomotor  agitation, anxiety, (Hypo) Manic Symptoms:  Delusions, Distractibility, Hallucinations, Impulsivity, Irritable Mood, Anxiety Symptoms:  Excessive Worry, Psychotic Symptoms:  Delusions, Hallucinations: Auditory Paranoia, PTSD Symptoms: NA Total Time spent with patient: 1 hour  Past Psychiatric History: Patient was seeing a psychiatrist at [facility] He has been treated in the past with Prolixin injectable  Is the patient at risk to self? Yes.    Has the patient been a risk to self in the past 6 months? No.  Has the patient been a risk to self within the distant past? No.  Is the patient a risk to others? No.  Has the patient been a risk to others in the past 6 months? No.  Has the patient been a risk to others within the distant past? No.    Alcohol Screening: 1. How often do you have a drink containing alcohol?: Monthly or less 2. How many drinks containing alcohol do you have on a typical day when you are drinking?: 1 or 2 3. How often do you have six or more drinks on one occasion?: Never Preliminary Score: 0 4. How often during the last year have you found that you were not able to stop drinking once you had started?: Never 5. How often during the last year have you failed to do what was normally expected from you becasue of drinking?: Never 6. How often during the last year have you needed a first drink in the morning to get yourself going after a heavy drinking session?: Never 7. How often during the last year have you had a feeling of guilt of remorse after drinking?: Never 8. How often during the last year have you been unable to remember what  happened the night before because you had been drinking?: Never 9. Have you or someone else been injured as a result of your drinking?: No 10. Has a relative or friend or a doctor or another health worker been concerned about your drinking or suggested you cut down?: No Alcohol Use Disorder Identification Test Final Score  (AUDIT): 1 Brief Intervention: AUDIT score less than 7 or less-screening does not suggest unhealthy drinking-brief intervention not indicated  Past Medical History:  Past Medical History:  Diagnosis Date  . Schizophrenia ([place])    History reviewed. No pertinent surgical history.   Family History: History reviewed. No pertinent family history.   Family Psychiatric  History: Not known  Tobacco Screening: Tobacco use, Select all that apply: 5 or more cigarettes per day Are you interested in Tobacco Cessation Medications?: No, patient refused Counseled patient on smoking cessation including recognizing danger situations, developing coping skills and basic information about quitting provided: Refused/Declined practical counseling   Social History:  History  Alcohol Use No     History  Drug Use No     Allergies:  No Known Allergies   Lab Results:  Results for orders placed or performed during the hospital encounter of 01/11/17 (from the past 48 hour(s))  Hemoglobin A1c     Status: None   Collection Time: 01/12/17  6:53 AM  Result Value Ref Range   Hgb A1c MFr Bld 5.3 4.8 - 5.6 %    Comment: (NOTE) Pre diabetes:          5.7%-6.4% Diabetes:              >6.4% Glycemic control for   <7.0% adults with diabetes    Mean Plasma Glucose 105.41 mg/dL    Comment: Performed at [facility], [place] [address] [place], [place] [address]  Lipid panel     Status: None   Collection Time: 01/12/17  6:53 AM  Result Value Ref Range   Cholesterol 149 0 - 200 mg/dL   Triglycerides 48 <150 mg/dL   HDL 51 >40 mg/dL   Total CHOL/HDL Ratio 2.9 RATIO   VLDL 10 0 - 40 mg/dL   LDL Cholesterol 88 0 - 99 mg/dL    Comment:        Total Cholesterol/HDL:CHD Risk Coronary Heart Disease Risk Table                     Men   Women  1/2 Average Risk   3.4   3.3  Average Risk       5.0   4.4  2 X Average Risk   9.6   7.1  3 X Average Risk  23.4   11.0        Use the calculated Patient Ratio above  and the CHD Risk Table to determine the patient's CHD Risk.        ATP III CLASSIFICATION (LDL):  <100     mg/dL   Optimal  100-129  mg/dL   Near or Above                    Optimal  130-159  mg/dL   Borderline  160-189  mg/dL   High  >190     mg/dL   Very High   TSH     Status: None   Collection Time: 01/12/17  6:53 AM  Result Value Ref Range   TSH 1.808 0.350 - 4.500 uIU/mL    Comment:  Performed by a 3rd Generation assay with a functional sensitivity of <=0.01 uIU/mL.    Blood Alcohol level:  Lab Results  Component Value Date   ETH <5 01/11/2017   ETH <5 18/56/3149    Metabolic Disorder Labs:  Lab Results  Component Value Date   HGBA1C 5.3 01/12/2017   MPG 105.41 01/12/2017   MPG 114 12/13/2016   No results found for: PROLACTIN Lab Results  Component Value Date   CHOL 149 01/12/2017   TRIG 48 01/12/2017   HDL 51 01/12/2017   CHOLHDL 2.9 01/12/2017   VLDL 10 01/12/2017   LDLCALC 88 01/12/2017   LDLCALC 65 12/13/2016    Current Medications: Current Facility-Administered Medications  Medication Dose Route Frequency Provider Last Rate Last Dose  . acetaminophen (TYLENOL) tablet 650 mg  650 mg Oral Q6H PRN [name], [name], MD      . alum & mag hydroxide-simeth (MAALOX/MYLANTA) 200-200-20 MG/5ML suspension 30 mL  30 mL Oral Q4H PRN [name], [name], MD      . fluPHENAZine (PROLIXIN) tablet 5 mg  5 mg Oral Q8H PRN [name], [name], MD      . hydrOXYzine (ATARAX/VISTARIL) tablet 50 mg  50 mg Oral TID PRN [name], [name], MD      . magnesium hydroxide (MILK OF MAGNESIA) suspension 30 mL  30 mL Oral Daily PRN [name], [name], MD      . traZODone (DESYREL) tablet 100 mg  100 mg Oral QHS PRN [name], [name], MD      . zolpidem (AMBIEN) tablet 10 mg  10 mg Oral QHS [name], [name], MD       PTA Medications: Prescriptions Prior to Admission  Medication Sig Dispense Refill Last Dose  . diphenhydrAMINE (BENADRYL) 25 mg capsule Take 1 capsule (25 mg total) by mouth at  bedtime. 30 capsule 1   . fluPHENAZine decanoate (PROLIXIN) 25 MG/ML injection Inject 2 mLs (50 mg total) into the muscle every 14 (fourteen) days. 5 mL 1   . zolpidem (AMBIEN) 10 MG tablet Take 1 tablet (10 mg total) by mouth at bedtime. 30 tablet 0     Musculoskeletal: Strength & Muscle Tone: within normal limits Gait & Station: normal Patient leans: N/A  Psychiatric Specialty Exam: Physical Exam  Constitutional: He is oriented to person, place, and time. He appears well-developed and well-nourished.  HENT:  Head: Normocephalic and atraumatic.  Eyes: Conjunctivae and EOM are normal.  Neck: Normal range of motion.  Respiratory: Effort normal.  Musculoskeletal: Normal range of motion.  Neurological: He is alert and oriented to person, place, and time.    Review of Systems  Constitutional: Negative.   HENT: Negative.   Eyes: Negative.   Respiratory: Negative.   Cardiovascular: Negative.   Gastrointestinal: Negative.   Genitourinary: Negative.   Musculoskeletal: Negative.   Skin: Negative.   Neurological: Negative.   Endo/Heme/Allergies: Negative.   Psychiatric/Behavioral: Negative.     Blood pressure (!) 138/96, pulse (!) 102, temperature 98.2 F (36.8 C), temperature source Oral, resp. rate 18, height 5\' 9"  (1.753 m), weight 72.3 kg (159 lb 6.4 oz), SpO2 100 %.Body mass index is 23.54 kg/m.  General Appearance: Fairly Groomed  Eye Contact:  Fair  Speech:  Garbled  Volume:  Normal  Mood:  Irritable  Affect:  Constricted  Thought Process:  Irrelevant and Descriptions of Associations: Loose  Orientation:  Full (Time, Place, and Person)  Thought Content:  Delusions  Suicidal Thoughts:  No  Homicidal Thoughts:  No  Memory:  Immediate;   Poor Recent;   Poor Remote;   Poor  Judgement:  Impaired  Insight:  Lacking  Psychomotor Activity:  Increased  Concentration:  Concentration: Poor and Attention Span: Poor  Recall:  Poor  Fund of Knowledge:  Poor  Language:  Poor   Akathisia:  No  Handed:    AIMS (if indicated):     Assets:  Communication Skills  ADL's:  Intact  Cognition:  Impaired,  Mild  Sleep:  Number of Hours: 4    Treatment Plan Summary:  A 61 year old male with schizophrenia. Apparently the patient has encountered greatest difficulties in order to receive his long acting injectable medication. Per [facility] collateral information the patient has not been receiving the injection at [facility] because they didn't have a nurse. Apparently [facility] is unable to see him due to his insurance. D/c on 7/11 on prolixin dec and likely has not received the inj since discharge  Schizophrenia vrs Schizoaffective: received prolixin dec 50 mg on 8/9 while in the ER.  Pt has been refusing oral medications.  I will place orders for non emergency forced medications.  Will start prolixin liq 5 mg bid---If he refuses there are orders for prolixin IM 5 mg bid  Mood symptoms: will order depakote 500 mg po bid  Tobacco use disorder however nicotine patch 21 mg  Agitation patient has orders for Ativan 2 mg every 4 hours when necessary PO or IM  Insomnia patient has orders for Ativan 2 mg by mouth daily at bedtime  Vital signs daily  Diet regular  Precautions every 15 minute checks  Hospitalization status involuntary commitment  Collateral information is needed from  patient's family.  Pt is in need of ACT---will need IPRS founding as he only has medicare.  Physician Treatment Plan for Primary Diagnosis: <principal problem not specified> Long Term Goal(s): Improvement in symptoms so as ready for discharge  Short Term Goals: Ability to demonstrate self-control will improve and Compliance with prescribed medications will improve  Physician Treatment Plan for Secondary Diagnosis: Active Problems:   Schizophrenia ([place])  Long Term Goal(s): Improvement in symptoms so as ready for discharge  Short Term Goals: Ability to identify changes in lifestyle to reduce  recurrence of condition will improve and Ability to identify and develop effective coping behaviors will improve  I certify that inpatient services furnished can reasonably be expected to improve the patient's condition.    Hildred Priest, MD 8/10/20181:38 PM

## 2017-01-12 NOTE — Progress Notes (Signed)
Pt seen resting in bed no aggressive behaviors noted.

## 2017-01-12 NOTE — Progress Notes (Signed)
[facility] Second Physician Opinion Progress Note for Medication Administration to Non-consenting Patients (For Involuntarily Committed Patients)  Patient: Dustin [name] Date of Birth: [id number] MRN: [id number]  Reason for the Medication: The patient, without the benefit of the specific treatment measure, is incapable of participating in any available treatment plan that will give the patient a realistic opportunity of improving the patient's condition. There is, without the benefit of the specific treatment measure, a significant possibility that the patient will harm self or others before improvement of the patient's condition is realized.  Consideration of Side Effects: Consideration of the side effects related to the medication plan has been given.  Rationale for Medication Administration: Patient was schizophrenia with a history of aggression and violence but is refusing medicine. Poor insight. Unable to cooperate and interact appropriately on the unit and also posing a risk to himself without appropriate medication.    Alethia Berthold, MD 01/12/17  3:49 PM   This documentation is good for (7) seven days from the date of the MD signature. New documentation must be completed every seven (7) days with detailed justification in the medical record if the patient requires continued non-emergent administration of psychotropic medications.

## 2017-01-12 NOTE — Tx Team (Signed)
Interdisciplinary Treatment and Diagnostic Plan Update  01/12/2017 Time of Session: 11:30 AM SCHUYLER OLDEN MRN: [id number]  Principal Diagnosis: <principal problem not specified>  Secondary Diagnoses: Active Problems:   Schizophrenia ([place])   Current Medications:  Current Facility-Administered Medications  Medication Dose Route Frequency Provider Last Rate Last Dose  . acetaminophen (TYLENOL) tablet 650 mg  650 mg Oral Q6H PRN [name], [name], MD      . alum & mag hydroxide-simeth (MAALOX/MYLANTA) 200-200-20 MG/5ML suspension 30 mL  30 mL Oral Q4H PRN [name], [name], MD      . fluPHENAZine (PROLIXIN) tablet 5 mg  5 mg Oral Q8H PRN [name], [name], MD      . hydrOXYzine (ATARAX/VISTARIL) tablet 50 mg  50 mg Oral TID PRN [name], [name], MD      . magnesium hydroxide (MILK OF MAGNESIA) suspension 30 mL  30 mL Oral Daily PRN [name], [name], MD      . traZODone (DESYREL) tablet 100 mg  100 mg Oral QHS PRN [name], [name], MD      . zolpidem (AMBIEN) tablet 10 mg  10 mg Oral QHS [name], [name], MD       PTA Medications: Prescriptions Prior to Admission  Medication Sig Dispense Refill Last Dose  . diphenhydrAMINE (BENADRYL) 25 mg capsule Take 1 capsule (25 mg total) by mouth at bedtime. 30 capsule 1   . fluPHENAZine decanoate (PROLIXIN) 25 MG/ML injection Inject 2 mLs (50 mg total) into the muscle every 14 (fourteen) days. 5 mL 1   . zolpidem (AMBIEN) 10 MG tablet Take 1 tablet (10 mg total) by mouth at bedtime. 30 tablet 0     Patient Stressors: Financial difficulties Medication change or noncompliance  Patient Strengths: Technical sales engineer for treatment/growth  Treatment Modalities: Medication Management, Group therapy, Case management,  1 to 1 session with clinician, Psychoeducation, Recreational therapy.   Physician Treatment Plan for Primary Diagnosis: <principal problem not specified> Long Term Goal(s):     Short Term Goals:    Medication Management:  Evaluate patient's response, side effects, and tolerance of medication regimen.  Therapeutic Interventions: 1 to 1 sessions, Unit Group sessions and Medication administration.  Evaluation of Outcomes: Not Met  Physician Treatment Plan for Secondary Diagnosis: Active Problems:   Schizophrenia ([place])  Long Term Goal(s):     Short Term Goals:       Medication Management: Evaluate patient's response, side effects, and tolerance of medication regimen.  Therapeutic Interventions: 1 to 1 sessions, Unit Group sessions and Medication administration.  Evaluation of Outcomes: Not Met   RN Treatment Plan for Primary Diagnosis: <principal problem not specified> Long Term Goal(s): Knowledge of disease and therapeutic regimen to maintain health will improve  Short Term Goals: Ability to remain free from injury will improve, Ability to participate in decision making will improve and Compliance with prescribed medications will improve  Medication Management: RN will administer medications as ordered by provider, will assess and evaluate patient's response and provide education to patient for prescribed medication. RN will report any adverse and/or side effects to prescribing provider.  Therapeutic Interventions: 1 on 1 counseling sessions, Psychoeducation, Medication administration, Evaluate responses to treatment, Monitor vital signs and CBGs as ordered, Perform/monitor CIWA, COWS, AIMS and Fall Risk screenings as ordered, Perform wound care treatments as ordered.  Evaluation of Outcomes: Progressing   LCSW Treatment Plan for Primary Diagnosis: <principal problem not specified> Long Term Goal(s): Safe transition to appropriate next level of  care at discharge, Engage patient in therapeutic group addressing interpersonal concerns.  Short Term Goals: Engage patient in aftercare planning with referrals and resources, Increase social support and Increase skills for wellness and recovery  Therapeutic  Interventions: Assess for all discharge needs, 1 to 1 time with Social worker, Explore available resources and support systems, Assess for adequacy in community support network, Educate family and significant other(s) on suicide prevention, Complete Psychosocial Assessment, Interpersonal group therapy.  Evaluation of Outcomes: Progressing   Progress in Treatment: Attending groups: No. Participating in groups: No. Taking medication as prescribed: Yes. Toleration medication: Yes. Family/Significant other contact made: No, will contact:  CSW assessing appropriate contact Patient understands diagnosis: No. Discussing patient identified problems/goals with staff: Yes. Medical problems stabilized or resolved: Yes. Denies suicidal/homicidal ideation: Yes. Issues/concerns per patient self-inventory: No.  New problem(s) identified: No, Describe:  None identified.  New Short Term/Long Term Goal(s): Patient unable to make goal at this time.  Discharge Plan or Barriers: CSW assessing appropriate discharge plan.  Reason for Continuation of Hospitalization: Hallucinations Medication stabilization  Estimated Length of Stay: 3-5 days   Attendees: Patient: Dustin [name] 01/12/2017 1:47 PM  Physician: Dr. Merlyn Albert, MD  01/12/2017 1:47 PM  Nursing: Ardeen Fillers, BSN, RN  01/12/2017 1:47 PM  RN Care Manager: 01/12/2017 1:47 PM  Social Worker: Glorious Peach, MSW, LCSW-A 01/12/2017 1:47 PM  Recreational Therapist: Drue Flirt, LRT, [place]  01/12/2017 1:47 PM  Other:  01/12/2017 1:47 PM  Other:  01/12/2017 1:47 PM  Other: 01/12/2017 1:47 PM    Scribe for Treatment Team: Emilie Rutter, [place] 01/12/2017 1:47 PM

## 2017-01-12 NOTE — [facility] Group Notes (Signed)
[facility] LCSW Group Therapy Note  Date/Time: 01/12/17, 1300  Type of Therapy and Topic:  Group Therapy:  Feelings around Relapse and Recovery  Participation Level:  Did Not Attend   Mood:  Description of Group:    Patients in this group will discuss emotions they experience before and after a relapse. They will process how experiencing these feelings, or avoidance of experiencing them, relates to having a relapse. Facilitator will guide patients to explore emotions they have related to recovery. Patients will be encouraged to process which emotions are more powerful. They will be guided to discuss the emotional reaction significant others in their lives may have to patients' relapse or recovery. Patients will be assisted in exploring ways to respond to the emotions of others without this contributing to a relapse.  Therapeutic Goals: 1. Patient will identify two or more emotions that lead to relapse for them:  2. Patient will identify two emotions that result when they relapse:  3. Patient will identify two emotions related to recovery:  4. Patient will demonstrate ability to communicate their needs through discussion and/or role plays.   Summary of Patient Progress:     Therapeutic Modalities:   Cognitive Behavioral Therapy Solution-Focused Therapy Assertiveness Training Relapse Prevention Therapy  Greg Ashly Yepez, LCSW       

## 2017-01-12 NOTE — Progress Notes (Signed)
[facility] Second Physician Opinion Progress Note for Medication Administration to Non-consenting Patients (For Involuntarily Committed Patients)  Patient: Dustin [name] Date of Birth: [id number] MRN: [id number]  Reason for the Medication: The patient, without the benefit of the specific treatment measure, is incapable of participating in any available treatment plan that will give the patient a realistic opportunity of improving the patient's condition. There is, without the benefit of the specific treatment measure, a significant possibility that the patient will harm self or others before improvement of the patient's condition is realized.  Consideration of Side Effects: Consideration of the side effects related to the medication plan has been given.  Rationale for Medication Administration: the patient unable to participate in treatment due to acuity of symptoms.    Orson Slick, MD 01/12/17  4:08 PM   This documentation is good for (7) seven days from the date of the MD signature. New documentation must be completed every seven (7) days with detailed justification in the medical record if the patient requires continued non-emergent administration of psychotropic medications.

## 2017-01-12 NOTE — Tx Team (Signed)
Initial Treatment Plan 01/12/2017 1:28 AM [name] [id number]:[id number]    PATIENT STRESSORS: Financial difficulties Medication change or noncompliance   PATIENT STRENGTHS: General fund of knowledge Motivation for treatment/growth   PATIENT IDENTIFIED PROBLEMS: psychosis     shizophrenia                 DISCHARGE CRITERIA:  Adequate post-discharge living arrangements Improved stabilization in mood, thinking, and/or behavior Motivation to continue treatment in a less acute level of care  PRELIMINARY DISCHARGE PLAN: Outpatient therapy  PATIENT/FAMILY INVOLVEMENT: This treatment plan has been presented to and reviewed with the patient, Dustin [name],  The patient and family have been given the opportunity to ask questions and make suggestions.  Ailene Rud Kennie Karapetian, RN 01/12/2017, 1:28 AM

## 2017-01-12 NOTE — Progress Notes (Signed)
Admission Note:  61 yr male who presents IVC in no acute distress for the treatment of Psychosis; auditory hallucination; delusion of  persecution. Patient appears irritable and angry using profanities, affect is blunted, patient was not calm during admission process, he kept using vulgar words and sexual explicit terms and was often told to be mindful of such words. Patient  denies SI/AVH but noted responding to internal stimuli. Patient's thoughts are disorganized, speech is pressured and tangential. Patient  has Past medical Hx of schizophrenia and substance abuse. Patient's skin was assessed in presence of Veronique B.RN and found to be warm and dry, patient looks disheveled with a poor hygiene. Patient was also searched and no contraband found, POC and unit policies explained and understanding verbalized. Consents obtained. Food and fluids offered, and both accepted. 15 minutes checks maintained will continue to monitor.

## 2017-01-12 NOTE — Progress Notes (Signed)
Pt is in irritable hostile mood cursing at staff. He is placed on forced medications due to severity of illness and noncompliance. Pt given choice to take po prolixin pt willingly took medication.

## 2017-01-12 NOTE — Progress Notes (Signed)
[facility] Physician Opinion Progress Note for Medication Administration to Non-consenting Patients (For Involuntarily Committed Patients)  Patient: Dustin [name] Date of Birth: [id number] MRN: [id number]  Reason for the Medication: The patient, without the benefit of the specific treatment measure, is incapable of participating in any available treatment plan that will give the patient a realistic opportunity of improving the patient's condition. There is, without the benefit of the specific treatment measure, a significant possibility that the patient will harm self or others before improvement of the patient's condition is realized.  Consideration of Side Effects: Consideration of the side effects related to the medication plan has been given.  Rationale for Medication Administration: refusing meds.  During last admission he refused orals and was on forced medications.  He is very disorganized, easily agitated and paranoid. Impaired insight.    Hildred Priest, MD 01/12/17  3:23 PM   This documentation is good for (7) seven days from the date of the MD signature. New documentation must be completed every seven (7) days with detailed justification in the medical record if the patient requires continued non-emergent administration of psychotropic medications.

## 2017-01-12 NOTE — Progress Notes (Signed)
Recreation Therapy Notes  Date: 08.10.18 Time: 9:30 am Location: Craft Room  Group Topic: Coping Skills  Goal Area(s) Addresses:  Patient will verbalize benefit of using art as a coping skill. Patient will verbalize one emotion experienced in group.  Behavioral Response: Did not attend  Intervention: Coloring  Activity: Patients were given coloring sheets to color and were instructed to think about the emotions they were feeling as well as what their minds were focused on.  Education: LRT educated patients on healthy coping skills.  Education Outcome: Patient did not attend group.   Clinical Observations/Feedback: Patient did not attend group.  Leonette Monarch, LRT/CTRS 01/12/2017 10:06 AM

## 2017-01-12 NOTE — Progress Notes (Signed)
Recreation Therapy Notes  According to notes, pt was irritable, angry, and psychotic. LRT will attempt assessment at a later time.  Leonette Monarch, LRT/CTRS 01/12/2017 4:27 PM

## 2017-01-13 DIAGNOSIS — F17210 Nicotine dependence, cigarettes, uncomplicated: Secondary | ICD-10-CM

## 2017-01-13 DIAGNOSIS — R451 Restlessness and agitation: Secondary | ICD-10-CM

## 2017-01-13 DIAGNOSIS — F39 Unspecified mood [affective] disorder: Secondary | ICD-10-CM

## 2017-01-13 DIAGNOSIS — G4700 Insomnia, unspecified: Secondary | ICD-10-CM

## 2017-01-13 NOTE — BHH Group Notes (Signed)
[facility] Group Notes:  (Nursing/MHT/Case Management/Adjunct)  Date:  01/13/2017  Time:  9:39 PM  Type of Therapy:  Evening Wrap-up Group  Participation Level:  Did Not Attend  Participation Quality:  N/A  Affect:  N/A  Cognitive:  N/A  Insight:  None  Engagement in Group:  Did Not Attend  Modes of Intervention:  Discussion  Summary of Progress/Problems:  Levonne Spiller 01/13/2017, 9:39 PM

## 2017-01-13 NOTE — Plan of Care (Signed)
Problem: Safety: Goal: Ability to redirect hostility and anger into socially appropriate behaviors will improve Outcome: Not Progressing Initially in this shift, Patient was observed sitting in the dayroom with peers and was without hostility.  He took his 2000 medication and was asked to come back after snack.  Writer approached Patient at his room after snack and observed him in the bed under the cover.  He became verbally aggressive with name calling and blaming in response to being asked back to the medication room.  He refused to accept medication.  He came to the medication room and again was verbally aggressive but did take the Ativan and went back to bed.  Problem: Education: Goal: Utilization of techniques to improve thought processes will improve Outcome: Not Progressing Patient refused information on medication times.  He would benefit from having all medications due at the same time.

## 2017-01-13 NOTE — Progress Notes (Signed)
[facility] MD Progress Note  01/13/2017 10:02 AM Dustin [name]  MRN:  [id number] Subjective:  Dustin [name] an 61 y.o.male with Schizophreniapresented to our ER under IVC on 8/9. Pt was agitated, loud, uncooperative, paranoid and verbally aggressive. Per petition "trying to insight police to kill him". Patient seen in his room this morning his sitting in his bed with a bright smile. States he is doing quite well. He then makes some inappropriate comments to this physician. He starts winking.   He is on forced medication. He was recently admitted to the unit in July and discharged on July 11. At that time he was disorganized and delusional. He had been placed on forced medications. He had been living at his brother's house. It is suspected that patient has not been taking his medications.  Per staff patient continues to be disorganized and irritable. He frequently uses profanity. Principal Problem: Schizophrenia ([place]) Diagnosis:   Patient Active Problem List   Diagnosis Date Noted  . Schizophrenia ([place]) [F20.9] 01/11/2017  . Tobacco use disorder [F17.200] 11/29/2016   Total Time spent with patient: 20 minutes  Past Psychiatric History: Patient was seeing a psychiatrist at [facility] He has been treated in the past with Prolixin injectable   Past Medical History:  Past Medical History:  Diagnosis Date  . Schizophrenia ([place])    History reviewed. No pertinent surgical history. Family History: History reviewed. No pertinent family history. Family Psychiatric  History: Unknown Social History:  History  Alcohol Use No     History  Drug Use No    Social History   Social History  . Marital status: Single    Spouse name: N/A  . Number of children: N/A  . Years of education: N/A   Social History Main Topics  . Smoking status: Current Every Day Smoker    Packs/day: 1.00    Years: 15.00  . Smokeless tobacco: Never Used  . Alcohol use No  . Drug use: No  . Sexual activity:  No   Other Topics Concern  . None   Social History Narrative  . None   Additional Social History:                         Sleep: Poor  Appetite:  Poor  Current Medications: Current Facility-Administered Medications  Medication Dose Route Frequency Provider Last Rate Last Dose  . acetaminophen (TYLENOL) tablet 650 mg  650 mg Oral Q6H PRN [name], [name], MD      . alum & mag hydroxide-simeth (MAALOX/MYLANTA) 200-200-20 MG/5ML suspension 30 mL  30 mL Oral Q4H PRN [name], [name], MD      . divalproex (DEPAKOTE) DR tablet 500 mg  500 mg Oral Q12H Hildred Priest, MD   500 mg at 01/13/17 5053  . fluPHENAZine (PROLIXIN) injection 5 mg  5 mg Intramuscular BID Hildred Priest, MD      . fluPHENAZine (PROLIXIN) tablet 5 mg  5 mg Oral BID Hildred Priest, MD   5 mg at 01/13/17 9767  . [START ON 01/25/2017] fluPHENAZine decanoate (PROLIXIN) injection 50 mg  50 mg Intramuscular Q14 Days Hildred Priest, MD      . LORazepam (ATIVAN) tablet 2 mg  2 mg Oral Q4H PRN Hildred Priest, MD       Or  . LORazepam (ATIVAN) injection 2 mg  2 mg Intramuscular Q4H PRN Hildred Priest, MD      . LORazepam (ATIVAN) tablet 2 mg  2  mg Oral QHS Hildred Priest, MD      . magnesium hydroxide (MILK OF MAGNESIA) suspension 30 mL  30 mL Oral Daily PRN [name], [name], MD        Lab Results:  Results for orders placed or performed during the hospital encounter of 01/11/17 (from the past 48 hour(s))  Hemoglobin A1c     Status: None   Collection Time: 01/12/17  6:53 AM  Result Value Ref Range   Hgb A1c MFr Bld 5.3 4.8 - 5.6 %    Comment: (NOTE) Pre diabetes:          5.7%-6.4% Diabetes:              >6.4% Glycemic control for   <7.0% adults with diabetes    Mean Plasma Glucose 105.41 mg/dL    Comment: Performed at [facility], [place] [address], [place], [place] [address]  Lipid panel     Status: None   Collection  Time: 01/12/17  6:53 AM  Result Value Ref Range   Cholesterol 149 0 - 200 mg/dL   Triglycerides 48 <150 mg/dL   HDL 51 >40 mg/dL   Total CHOL/HDL Ratio 2.9 RATIO   VLDL 10 0 - 40 mg/dL   LDL Cholesterol 88 0 - 99 mg/dL    Comment:        Total Cholesterol/HDL:CHD Risk Coronary Heart Disease Risk Table                     Men   Women  1/2 Average Risk   3.4   3.3  Average Risk       5.0   4.4  2 X Average Risk   9.6   7.1  3 X Average Risk  23.4   11.0        Use the calculated Patient Ratio above and the CHD Risk Table to determine the patient's CHD Risk.        ATP III CLASSIFICATION (LDL):  <100     mg/dL   Optimal  100-129  mg/dL   Near or Above                    Optimal  130-159  mg/dL   Borderline  160-189  mg/dL   High  >190     mg/dL   Very High   TSH     Status: None   Collection Time: 01/12/17  6:53 AM  Result Value Ref Range   TSH 1.808 0.350 - 4.500 uIU/mL    Comment: Performed by a 3rd Generation assay with a functional sensitivity of <=0.01 uIU/mL.    Blood Alcohol level:  Lab Results  Component Value Date   ETH <5 01/11/2017   ETH <5 22/07/5425    Metabolic Disorder Labs: Lab Results  Component Value Date   HGBA1C 5.3 01/12/2017   MPG 105.41 01/12/2017   MPG 114 12/13/2016   No results found for: PROLACTIN Lab Results  Component Value Date   CHOL 149 01/12/2017   TRIG 48 01/12/2017   HDL 51 01/12/2017   CHOLHDL 2.9 01/12/2017   VLDL 10 01/12/2017   LDLCALC 88 01/12/2017   LDLCALC 65 12/13/2016    Physical Findings: AIMS: Facial and Oral Movements Muscles of Facial Expression: None, normal Lips and Perioral Area: None, normal Jaw: None, normal Tongue: None, normal,Extremity Movements Upper (arms, wrists, hands, fingers): None, normal Lower (legs, knees, ankles, toes): None, normal, Trunk Movements Neck,  shoulders, hips: None, normal, Overall Severity Severity of abnormal movements (highest score from questions above): None,  normal Incapacitation due to abnormal movements: None, normal Patient's awareness of abnormal movements (rate only patient's report): No Awareness, Dental Status Current problems with teeth and/or dentures?: No Does patient usually wear dentures?: Yes  CIWA:    COWS:  COWS Total Score: 0  Musculoskeletal: Strength & Muscle Tone: within normal limits Gait & Station: normal Patient leans: N/A  Psychiatric Specialty Exam: Physical Exam  ROS  Blood pressure 121/89, pulse 91, temperature 98.3 F (36.8 C), temperature source Oral, resp. rate 18, height 5\' 9"  (1.753 m), weight 159 lb 6.4 oz (72.3 kg), SpO2 100 %.Body mass index is 23.54 kg/m.  General Appearance: Casual  Eye Contact:  Fair  Speech:  Garbled  Volume:  Normal  Mood:  Euphoric  Affect:  Inappropriate  Thought Process:  Irrelevant  Orientation:  Full (Time, Place, and Person)  Thought Content:  Delusions  Suicidal Thoughts:  No  Homicidal Thoughts:  No  Memory:  Immediate;   Poor Recent;   Poor Remote;   Poor  Judgement:  Impaired  Insight:  Lacking  Psychomotor Activity:  Increased  Concentration:  Concentration: Fair and Attention Span: Fair  Recall:  Poor  Fund of Knowledge:  Poor  Language:  Fair  Akathisia:  No  Handed:  Right  AIMS (if indicated):     Assets:  Communication Skills  ADL's:  Intact  Cognition:  Impaired,  Mild  Sleep:  Number of Hours: 6     Treatment Plan Summary: Daily contact with patient to assess and evaluate symptoms and progress in treatment and Medication management  A 61 year old male with schizophrenia. Apparently the patient has encountered greatest difficulties in order to receive his long acting injectable medication. Per [facility] collateral information the patient has not been receiving the injection at [facility] because they didn't have a nurse. Apparently [facility] is unable to see him due to his insurance. D/c on 7/11 on prolixin dec and likely has not received the inj since  discharge  Schizophrenia vrs Schizoaffective: received prolixin dec 50 mg on 8/9 while in the ER.  Pt has been refusing oral medications.  I will place orders for non emergency forced medications.  Will start prolixin liq 5 mg bid---If he refuses there are orders for prolixin IM 5 mg bid  Mood symptoms: will order depakote 500 mg po bid  Tobacco use disorder- nicotine patch 21 mg  Agitation patient has orders for Ativan 2 mg every 4 hours when necessary PO or IM  Insomnia patient has orders for Ativan 2 mg by mouth daily at bedtime  Vital signs daily  Diet regular  Precautions every 15 minute checks  Hospitalization status involuntary commitment  Collateral information is needed from  patient's family.  Pt is in need of ACT---will need IPRS founding as he only has medicare.  Physician Treatment Plan for Primary Diagnosis: <principal problem not specified> Long Term Goal(s): Improvement in symptoms so as ready for discharge  Short Term Goals: Ability to demonstrate self-control will improve and Compliance with prescribed medications will improve  Physician Treatment Plan for Secondary Diagnosis: Active Problems:   Schizophrenia ([place])  Long Term Goal(s): Improvement in symptoms so as ready for discharge  Short Term Goals: Ability to identify changes in lifestyle to reduce recurrence of condition will improve and Ability to identify and develop effective coping behaviors will improve  Gregary Blackard, MD 01/13/2017, 10:02 AM

## 2017-01-13 NOTE — BHH Group Notes (Signed)
[facility] Group Notes:  (Nursing/MHT/Case Management/Adjunct)  Date:  01/13/2017  Time:  3:33 AM  Type of Therapy:  Psychoeducational Skills  Participation Level:  Did Not Attend  Summary of Progress/Problems:  Reece Agar 01/13/2017, 3:33 AM

## 2017-01-13 NOTE — Progress Notes (Signed)
Denies SI/HI/AVH.  No signs noted of responding to internal stimuli.  Patient isolated to room except for med pass and meals.  No group attendance. Minimal interaction noted with peers and staff.  Support offered Safety maintained.

## 2017-01-13 NOTE — Plan of Care (Signed)
Problem: Safety: Goal: Ability to remain free from injury will improve Outcome: Progressing Remains safe on the unit.  Safety rounds maintained.

## 2017-01-13 NOTE — Plan of Care (Signed)
Problem: Coping: Goal: Ability to cope will improve Outcome: Progressing Patient is observed in the bed with sheet over his head.  He came out for HS snack but interacted only minimally.  He came for medications to the med room and was verbally aggressive (cursed while taking medications).    Problem: Safety: Goal: Ability to disclose and discuss suicidal ideas will improve Outcome: Not Progressing Patient is not engaging with staff but is isolating self in his room.  He verbally insists on "being left alone".  He is without physical aggression.

## 2017-01-13 NOTE — BHH Group Notes (Signed)
[facility] LCSW Group Therapy  01/13/2017 2:40 PM  Type of Therapy:  Group Therapy  Participation Level:  Patient did not attend group. CSW invited patient to group.   Summary of Progress/Problems:  Ac Colan G. [place], [place] 01/13/2017, 2:41 PM

## 2017-01-14 NOTE — Progress Notes (Signed)
Affect restricted.  Denies SI/HI/AVH. In scrubs with notable body odor.  Tends to isolate to his room.  No interaction with peers when up in the milieu.  No group attendance.  Scheduled medications given.  Support offered.  Safety checks maintained.

## 2017-01-14 NOTE — Plan of Care (Signed)
Problem: Coping: Goal: Ability to cope will improve Outcome: Progressing Patient demonstrated improved behavior this evening: improved eye contact, no cussing when given medications, and initiated apologizing for previous cussing to Probation officer.  He reported "feeling like I might be bleeding in my mouth" .  He removed his upper dentures while in the medication room, and showed Probation officer.  No evidence of abrasion was seen but he may need further assessment.  He was given positive feedback for behavior.  Problem: Safety: Goal: Ability to disclose and discuss suicidal ideas will improve Outcome: Progressing Patient is not verbalizing SI.  Safety is maintained.

## 2017-01-14 NOTE — Progress Notes (Signed)
[facility] MD Progress Note  01/14/2017 1:34 PM Dustin [name]  MRN:  [id number] Subjective:  Dustin [name] an 61 y.o.male with Schizophreniapresented to our ER under IVC on 8/9. Pt was agitated, loud, uncooperative, paranoid and verbally aggressive. He is on forced medication. He was recently admitted to the unit in July and discharged on July 11. At that time he was disorganized and delusional. He had been placed on forced medications. He had been living at his brother's house. It is suspected that patient has not been taking his medications.   Patient was seen in his room this morning. He has a sheet over his head and states that he wants to rest and would not talk. Per staff patient continues to be disorganized and irritable. He has been observed to be having that she told his head since last evening per staff notes. He has insisted with staff that he wants to be left alone. He has not been aggressive on the unit. He has not been attending any groups. Principal Problem: Schizophrenia ([place]) Diagnosis:   Patient Active Problem List   Diagnosis Date Noted  . Schizophrenia ([place]) [F20.9] 01/11/2017  . Tobacco use disorder [F17.200] 11/29/2016   Total Time spent with patient: 20 minutes  Past Psychiatric History: Patient was seeing a psychiatrist at [facility]. He has been treated in the past with Prolixin injectable   Past Medical History:  Past Medical History:  Diagnosis Date  . Schizophrenia ([place])    History reviewed. No pertinent surgical history. Family History: History reviewed. No pertinent family history. Family Psychiatric  History: Unknown Social History:  History  Alcohol Use No     History  Drug Use No    Social History   Social History  . Marital status: Single    Spouse name: N/A  . Number of children: N/A  . Years of education: N/A   Social History Main Topics  . Smoking status: Current Every Day Smoker    Packs/day: 1.00    Years: 15.00  . Smokeless  tobacco: Never Used  . Alcohol use No  . Drug use: No  . Sexual activity: No   Other Topics Concern  . None   Social History Narrative  . None   Additional Social History:                         Sleep: Poor  Appetite:  Poor  Current Medications: Current Facility-Administered Medications  Medication Dose Route Frequency Provider Last Rate Last Dose  . acetaminophen (TYLENOL) tablet 650 mg  650 mg Oral Q6H PRN [name], [name], MD      . alum & mag hydroxide-simeth (MAALOX/MYLANTA) 200-200-20 MG/5ML suspension 30 mL  30 mL Oral Q4H PRN [name], [name], MD      . divalproex (DEPAKOTE) DR tablet 500 mg  500 mg Oral Q12H Hildred Priest, MD   500 mg at 01/14/17 0850  . fluPHENAZine (PROLIXIN) injection 5 mg  5 mg Intramuscular BID Hildred Priest, MD      . fluPHENAZine (PROLIXIN) tablet 5 mg  5 mg Oral BID Hildred Priest, MD   5 mg at 01/14/17 0849  . [START ON 01/25/2017] fluPHENAZine decanoate (PROLIXIN) injection 50 mg  50 mg Intramuscular Q14 Days Hildred Priest, MD      . LORazepam (ATIVAN) tablet 2 mg  2 mg Oral Q4H PRN Hildred Priest, MD       Or  . LORazepam (ATIVAN) injection 2  mg  2 mg Intramuscular Q4H PRN Hildred Priest, MD      . LORazepam (ATIVAN) tablet 2 mg  2 mg Oral QHS Hildred Priest, MD   2 mg at 01/13/17 2120  . magnesium hydroxide (MILK OF MAGNESIA) suspension 30 mL  30 mL Oral Daily PRN [name], [name], MD        Lab Results:  No results found for this or any previous visit (from the past 48 hour(s)).  Blood Alcohol level:  Lab Results  Component Value Date   ETH <5 01/11/2017   ETH <5 64/33/2951    Metabolic Disorder Labs: Lab Results  Component Value Date   HGBA1C 5.3 01/12/2017   MPG 105.41 01/12/2017   MPG 114 12/13/2016   No results found for: PROLACTIN Lab Results  Component Value Date   CHOL 149 01/12/2017   TRIG 48 01/12/2017   HDL 51 01/12/2017    CHOLHDL 2.9 01/12/2017   VLDL 10 01/12/2017   LDLCALC 88 01/12/2017   LDLCALC 65 12/13/2016    Physical Findings: AIMS: Facial and Oral Movements Muscles of Facial Expression: None, normal Lips and Perioral Area: None, normal Jaw: None, normal Tongue: None, normal,Extremity Movements Upper (arms, wrists, hands, fingers): None, normal Lower (legs, knees, ankles, toes): None, normal, Trunk Movements Neck, shoulders, hips: None, normal, Overall Severity Severity of abnormal movements (highest score from questions above): None, normal Incapacitation due to abnormal movements: None, normal Patient's awareness of abnormal movements (rate only patient's report): No Awareness, Dental Status Current problems with teeth and/or dentures?: No Does patient usually wear dentures?: Yes  CIWA:    COWS:  COWS Total Score: 0  Musculoskeletal: Strength & Muscle Tone: within normal limits Gait & Station: normal Patient leans: N/A  Psychiatric Specialty Exam: Physical Exam  ROS  Blood pressure (!) 148/90, pulse (!) 108, temperature 97.7 F (36.5 C), temperature source Oral, resp. rate 18, height 5\' 9"  (1.753 m), weight 159 lb 6.4 oz (72.3 kg), SpO2 100 %.Body mass index is 23.54 kg/m.  General Appearance: Casual  Eye Contact:  Fair  Speech:  Garbled  Volume:  Normal  Mood:  Euphoric  Affect:  Inappropriate  Thought Process:  Irrelevant  Orientation:  Full (Time, Place, and Person)  Thought Content:  Delusions  Suicidal Thoughts:  No  Homicidal Thoughts:  No  Memory:  Immediate;   Poor Recent;   Poor Remote;   Poor  Judgement:  Impaired  Insight:  Lacking  Psychomotor Activity:  Increased  Concentration:  Concentration: Fair and Attention Span: Fair  Recall:  Poor  Fund of Knowledge:  Poor  Language:  Fair  Akathisia:  No  Handed:  Right  AIMS (if indicated):     Assets:  Communication Skills  ADL's:  Intact  Cognition:  Impaired,  Mild  Sleep:  Number of Hours: 7      Treatment Plan Summary: Daily contact with patient to assess and evaluate symptoms and progress in treatment and Medication management  A 61 year old male with schizophrenia. Apparently the patient has encountered greatest difficulties in order to receive his long acting injectable medication. Per [facility] collateral information the patient has not been receiving the injection at [facility] because they didn't have a nurse. Apparently [facility] is unable to see him due to his insurance. D/c on 7/11 on prolixin dec and likely has not received the inj since discharge  Schizophrenia vrs Schizoaffective: received prolixin dec 50 mg on 8/9 while in the ER.  Pt has been refusing  oral medications.  I will place orders for non emergency forced medications.  Will start prolixin liq 5 mg bid---If he refuses there are orders for prolixin IM 5 mg bid  Mood symptoms: will order depakote 500 mg po bid  Tobacco use disorder- nicotine patch 21 mg  Agitation patient has orders for Ativan 2 mg every 4 hours when necessary PO or IM  Insomnia patient has orders for Ativan 2 mg by mouth daily at bedtime  Vital signs daily  Diet regular  Precautions every 15 minute checks  Hospitalization status involuntary commitment  Collateral information is needed from  patient's family.  Pt is in need of ACT---will need IPRS founding as he only has medicare.  Physician Treatment Plan for Primary Diagnosis: <principal problem not specified> Long Term Goal(s): Improvement in symptoms so as ready for discharge  Short Term Goals: Ability to demonstrate self-control will improve and Compliance with prescribed medications will improve  Physician Treatment Plan for Secondary Diagnosis: Active Problems:   Schizophrenia ([place])  Long Term Goal(s): Improvement in symptoms so as ready for discharge  Short Term Goals: Ability to identify changes in lifestyle to reduce recurrence of condition will improve and  Ability to identify and develop effective coping behaviors will improve  Elvin So, MD 01/14/2017, 1:34 PM

## 2017-01-14 NOTE — Plan of Care (Signed)
Problem: Safety: Goal: Ability to redirect hostility and anger into socially appropriate behaviors will improve Outcome: Progressing Pleasant and cooperative

## 2017-01-14 NOTE — BHH Group Notes (Signed)
[facility] LCSW Group Therapy  01/14/2017 3:40 PM  Type of Therapy:  Group Therapy  Participation Level:  Patient did not attend group. CSW invited patient to group.   Summary of Progress/Problems:   Dustin [name] G. [place], [place] 01/14/2017, 3:40 PM

## 2017-01-15 DIAGNOSIS — F209 Schizophrenia, unspecified: Secondary | ICD-10-CM

## 2017-01-15 MED ORDER — ZOLPIDEM TARTRATE 5 MG PO TABS
10.0000 mg | ORAL_TABLET | Freq: Every day | ORAL | Status: DC
  Administered 2017-01-15 – 2017-01-18 (×4): 10 mg via ORAL
  Filled 2017-01-15 (×4): qty 2

## 2017-01-15 MED ORDER — NICOTINE 21 MG/24HR TD PT24
21.0000 mg | MEDICATED_PATCH | Freq: Every day | TRANSDERMAL | Status: DC
  Filled 2017-01-15 (×9): qty 1

## 2017-01-15 NOTE — Plan of Care (Signed)
Problem: Safety: Goal: Ability to redirect hostility and anger into socially appropriate behaviors will improve Outcome: Not Progressing Pt will not have any anger outburst during this shift.

## 2017-01-15 NOTE — Progress Notes (Addendum)
Pt was actually pleasant this shift. Pt was not verbally aggressive. Pt was med compliant, no adverse effects noted. Pt denies pain. Denies SI, HI or A/V hallucinations. Pt contracted to safety. No distress noted. 15 min safety continue. Pt slept 7 hrs.

## 2017-01-15 NOTE — Progress Notes (Signed)
Patient was irritable and verbally abusive this morning.Resisted to take medications states "I am not your [place] pig".Took medicine with encouragement.Isolated in the room.No interactions with peers.Not attended groups.Appetite poor.Denies suicidal or homicidal ideations and AV hallucinations.Support & encouragement given.

## 2017-01-15 NOTE — Progress Notes (Signed)
[facility] MD Progress Note  01/15/2017 12:50 PM Dustin [name]  MRN:  [id number] Subjective:  Dustin [name] an 61 y.o.male with Schizophrenia who presented to ED under IVC on 8/9. Pt was agitated, loud, uncooperative, paranoid and verbally aggressive. Per petition "trying to insight police to kill him".  According to patient he was at home when police came and entered his home. He does not know why they came. He makes repetitive religious references and reports that his aunt is working with a devil Dr. He endorses recent auditory hallucinations but denies any currently. Denies SI HI or visual hallucinations. Denies taking any antipsychotic or mood stabilizing medications.  Discharge from our unit on 7/11.  At that time he was disorganized and delusional. Place on forced meds.  Finally stabilized on prolixin po and decanoate. He was discharged back to his brother's house.    I imagine he did not f/u or take any meds after discharge.    Per nursing staff: "Patient appears irritable and angry using profanities, affect is blunted, patient was not calm during admission process, he kept using vulgar words and sexual explicit terms and was often told to be mindful of such words. Patient denies SI/AVH but noted responding to internal stimuli. Patient's thoughts are disorganized, speech is pressured and tangential."   Trauma: unknown  Substance abuse denies. Urine toxicology negative. Alcohol below the detection limit. Patient is a heavy smoker.  He is currently on forced medication.  8/11 Patient seen in his room this morning his sitting in his bed with a bright smile. States he is doing quite well. He then makes some inappropriate comments to this physician. He starts winking.   8/12 Patient was seen in his room this morning. He has a sheet over his head and states that he wants to rest and would not talk. Per staff patient continues to be disorganized and irritable. He has been observed to be having  that she told his head since last evening per staff notes. He has insisted with staff that he wants to be left alone. He has not been aggressive on the unit. He has not been attending any groups.  8/13 Patient seen in room wearing scrubs and washing feet in bed with washcloth. He continues to state that he does not want to be here and he is not happy that he has been forced to take medications. He is concerned about receiving injection medication because "he needs a shot like he needs a shot in the head, with a bullet". Patient desires physician to call his brother and gives a phone number. Patient reports that he was given a Bible and he is pleased and extends his thanks. Denies SI/HI/Hallucinations.   Per Nursing Affect restricted. Denies SI/HI/AVH. In scrubs with notable body odor. Tends to isolate to his room.  No interaction with peers when up in the milieu.  No group attendance.  Scheduled medications given.  Support offered.  Safety checks maintained.   Patient demonstrated improved behavior this evening: improved eye contact, no cussing when given medications, and initiated apologizing for previous cussing to Probation officer.  He reported "feeling like I might be bleeding in my mouth" .  He removed his upper dentures while in the medication room, and showed Probation officer.  No evidence of abrasion was seen but he may need further assessment.  He was given positive feedback for behavior.  Principal Problem: Schizophrenia ([place]) Diagnosis:   Patient Active Problem List   Diagnosis Date Noted  .  Schizophrenia ([place]) [F20.9] 01/11/2017  . Tobacco use disorder [F17.200] 11/29/2016   Total Time spent with patient: 30 minutes  Past Psychiatric History: Patient was seeing a psychiatrist at [facility] He has been treated in the past with Prolixin injectable   Past Medical History:  Past Medical History:  Diagnosis Date  . Schizophrenia ([place])    History reviewed. No pertinent surgical history. Family  History: History reviewed. No pertinent family history. Family Psychiatric  History: Unknown Social History:  History  Alcohol Use No     History  Drug Use No    Social History   Social History  . Marital status: Single    Spouse name: N/A  . Number of children: N/A  . Years of education: N/A   Social History Main Topics  . Smoking status: Current Every Day Smoker    Packs/day: 1.00    Years: 15.00  . Smokeless tobacco: Never Used  . Alcohol use No  . Drug use: No  . Sexual activity: No   Other Topics Concern  . None   Social History Narrative  . None     Current Medications: Current Facility-Administered Medications  Medication Dose Route Frequency Provider Last Rate Last Dose  . acetaminophen (TYLENOL) tablet 650 mg  650 mg Oral Q6H PRN [name], [name], MD      . alum & mag hydroxide-simeth (MAALOX/MYLANTA) 200-200-20 MG/5ML suspension 30 mL  30 mL Oral Q4H PRN [name], [name], MD      . divalproex (DEPAKOTE) DR tablet 500 mg  500 mg Oral Q12H Hildred Priest, MD   500 mg at 01/15/17 0939  . fluPHENAZine (PROLIXIN) injection 5 mg  5 mg Intramuscular BID Hildred Priest, MD      . fluPHENAZine (PROLIXIN) tablet 5 mg  5 mg Oral BID Hildred Priest, MD   5 mg at 01/15/17 0939  . [START ON 01/25/2017] fluPHENAZine decanoate (PROLIXIN) injection 50 mg  50 mg Intramuscular Q14 Days Hildred Priest, MD      . magnesium hydroxide (MILK OF MAGNESIA) suspension 30 mL  30 mL Oral Daily PRN [name], [name], MD      . nicotine (NICODERM CQ - dosed in mg/24 hours) patch 21 mg  21 mg Transdermal Daily Hildred Priest, MD      . zolpidem (AMBIEN) tablet 10 mg  10 mg Oral QHS Hildred Priest, MD        Lab Results:  No results found for this or any previous visit (from the past 48 hour(s)).  Blood Alcohol level:  Lab Results  Component Value Date   ETH <5 01/11/2017   ETH <5 22/97/9892    Metabolic Disorder  Labs: Lab Results  Component Value Date   HGBA1C 5.3 01/12/2017   MPG 105.41 01/12/2017   MPG 114 12/13/2016   No results found for: PROLACTIN Lab Results  Component Value Date   CHOL 149 01/12/2017   TRIG 48 01/12/2017   HDL 51 01/12/2017   CHOLHDL 2.9 01/12/2017   VLDL 10 01/12/2017   LDLCALC 88 01/12/2017   LDLCALC 65 12/13/2016    Physical Findings: AIMS: Facial and Oral Movements Muscles of Facial Expression: None, normal Lips and Perioral Area: None, normal Jaw: None, normal Tongue: None, normal,Extremity Movements Upper (arms, wrists, hands, fingers): None, normal Lower (legs, knees, ankles, toes): None, normal, Trunk Movements Neck, shoulders, hips: None, normal, Overall Severity Severity of abnormal movements (highest score from questions above): None, normal Incapacitation due to abnormal movements: None, normal  Patient's awareness of abnormal movements (rate only patient's report): No Awareness, Dental Status Current problems with teeth and/or dentures?: No Does patient usually wear dentures?: Yes  CIWA:    COWS:  COWS Total Score: 0  Musculoskeletal: Strength & Muscle Tone: within normal limits Gait & Station: normal Patient leans: N/A  Psychiatric Specialty Exam: Physical Exam  Constitutional: He is oriented to person, place, and time. He appears well-developed and well-nourished.  HENT:  Head: Normocephalic and atraumatic.  Eyes: EOM are normal.  Neck: Normal range of motion.  Respiratory: Effort normal.  Musculoskeletal: Normal range of motion.  Neurological: He is alert and oriented to person, place, and time.  Psychiatric: He expresses no homicidal and no suicidal ideation. He expresses no suicidal plans and no homicidal plans.    Review of Systems  Constitutional: Negative.   HENT: Negative.   Eyes: Negative.   Respiratory: Negative.   Cardiovascular: Negative.   Gastrointestinal: Negative.   Genitourinary: Negative.   Musculoskeletal:  Negative.   Skin: Negative.   Neurological: Negative.   Endo/Heme/Allergies: Negative.     Blood pressure (!) 73/38, pulse (!) 107, temperature 98.1 F (36.7 C), temperature source Oral, resp. rate 18, height 5\' 9"  (1.753 m), weight 72.3 kg (159 lb 6.4 oz), SpO2 100 %.Body mass index is 23.54 kg/m.  General Appearance: Casual  Eye Contact:  Fair  Speech:  Garbled  Volume:  Normal  Mood:  Euphoric  Affect:  Inappropriate  Thought Process:  Irrelevant  Orientation:  Full (Time, Place, and Person)  Thought Content:  Delusions  Suicidal Thoughts:  No  Homicidal Thoughts:  No  Memory:  Immediate;   Poor Recent;   Poor Remote;   Poor  Judgement:  Impaired  Insight:  Lacking  Psychomotor Activity:  Increased  Concentration:  Concentration: Fair and Attention Span: Fair  Recall:  Poor  Fund of Knowledge:  Poor  Language:  Fair  Akathisia:  No  Handed:  Right  AIMS (if indicated):     Assets:  Communication Skills  ADL's:  Intact  Cognition:  Impaired,  Mild  Sleep:  Number of Hours: 8.15     Treatment Plan Summary: Daily contact with patient to assess and evaluate symptoms and progress in treatment and Medication management  A 61 year old male with schizophrenia. Apparently the patient has encountered greatest difficulties in order to receive his long acting injectable medication. Per [facility] collateral information the patient has not been receiving the injection at [facility] because they didn't have a nurse. Apparently [facility] is unable to see him due to his insurance. D/c on 7/11 on prolixin dec and likely has not received the inj since discharge  Schizophrenia vs Schizoaffective: received prolixin dec 50 mg on 8/9 while in the ER.  Pt has been refusing oral medications. Since 8/10 on   non emergency forced medications.   Continue Prolixin liq 5 mg BID and if  he refuses there are orders for prolixin IM 5 mg BID--has been taking orally since friday  Mood sx: Continue Depakote 500  mg po BID  Tobacco use d/o: Ordered nicotine patch 21 mg  Agitation: not agitated today.  No major incidents since Friday  Insomnia: will d/c ativan due to hypotension this am. Will order ambien 10 mg po qhs  Vital signs tid  Diet regular, encourage fluids  Precautions: Fall precautions, every 15 minute checks  Hospitalization status: involuntary commitment  Collateral information is needed from patient's family.  Pt is in need of ACT---will need  IPRS founding as he only has medicare.    Hildred Priest, MD 01/15/2017, 12:50 PM

## 2017-01-15 NOTE — Progress Notes (Signed)
Recreation Therapy Notes  Date: 08.13.18 Time: 9:30 am Location: Craft Room  Group Topic: Self-expression  Goal Area(s) Addresses:  Patient will identify one color per emotion listed on wheel. Patient will verbalize benefit of using art as a means of self-expression. Patient will verbalize one emotion experienced during session. Patient will be educated on other forms of self-expression.  Behavioral Response: Did not attend  Intervention: Emotion Wheel  Activity: Patients were given an Emotion Wheel worksheet and were instructed to pick a color for each emotion listed on the worksheet.  Education: LRT educated patients on other forms of self-expression.  Education Outcome: Patient did not attend group.   Clinical Observations/Feedback: Patient did not attend group. Patient walked down to the craft room without socks. LRT informed patient he needed to have socks on. Patient stated he was going to be difficult today. Patient wanted a hug and kiss from LRT. LRT told patient that he needed to go get his socks. Eventually patient complied and did not return to group.  Leonette Monarch, LRT/CTRS 01/15/2017 10:08 AM

## 2017-01-15 NOTE — [facility] Group Notes (Signed)
[facility] LCSW Group Therapy Note  Date/Time: 01/15/17, 1300  Type of Therapy and Topic:  Group Therapy:  Overcoming Obstacles  Participation Level:  Did not attend  Description of Group:    In this group patients will be encouraged to explore what they see as obstacles to their own wellness and recovery. They will be guided to discuss their thoughts, feelings, and behaviors related to these obstacles. The group will process together ways to cope with barriers, with attention given to specific choices patients can make. Each patient will be challenged to identify changes they are motivated to make in order to overcome their obstacles. This group will be process-oriented, with patients participating in exploration of their own experiences as well as giving and receiving support and challenge from other group members.  Therapeutic Goals: 1. Patient will identify personal and current obstacles as they relate to admission. 2. Patient will identify barriers that currently interfere with their wellness or overcoming obstacles.  3. Patient will identify feelings, thought process and behaviors related to these barriers. 4. Patient will identify two changes they are willing to make to overcome these obstacles:    Summary of Patient Progress      Therapeutic Modalities:   Cognitive Behavioral Therapy Solution Focused Therapy Motivational Interviewing Relapse Prevention Therapy  Greg Catelyn Friel, LCSW 

## 2017-01-15 NOTE — Plan of Care (Signed)
Problem: Coping: Goal: Ability to cope will improve Outcome: Not Progressing Irritable and verbally abusive this morning.

## 2017-01-16 NOTE — Progress Notes (Signed)
Recreation Therapy Notes  Date: 08.14.18 Time: 9:30 am Location: Craft Room  Group Topic: Goal Setting  Goal Area(s) Addresses:  Patient will write at least one goal. Patient will write at least one obstacle.  Behavioral Response: Did not attend  Intervention: Recovery Goal Chart  Activity: Patients were given construction paper and were instructed to make a Recovery Goal Chart including their goals, obstacles, the date they started working on their goal, and the date they achieved their goal.  Education: LRT educated patients on healthy ways to celebrate reaching their goals.  Education Outcome: Patient did not attend group.  Clinical Observations/Feedback: Patient did not attend group.  Leonette Monarch, LRT/CTRS 01/16/2017 9:59 AM

## 2017-01-16 NOTE — Progress Notes (Signed)
[facility] MD Progress Note  01/16/2017 12:19 PM FILEMON BRETON  MRN:  [id number] Subjective:  KIMI KROFT an 61 y.o.male with Schizophrenia who presented to ED under IVC on 8/9. Pt was agitated, loud, uncooperative, paranoid and verbally aggressive. Per petition "trying to insight police to kill him".  According to patient he was at home when police came and entered his home. He does not know why they came. He makes repetitive religious references and reports that his aunt is working with a devil Dr. He endorses recent auditory hallucinations but denies any currently. Denies SI HI or visual hallucinations. Denies taking any antipsychotic or mood stabilizing medications.  Discharge from our unit on 7/11.  At that time he was disorganized and delusional. Place on forced meds.  Finally stabilized on prolixin po and decanoate. He was discharged back to his brother's house.    I imagine he did not f/u or take any meds after discharge.    Per nursing staff: "Patient appears irritable and angry using profanities, affect is blunted, patient was not calm during admission process, he kept using vulgar words and sexual explicit terms and was often told to be mindful of such words. Patient denies SI/AVH but noted responding to internal stimuli. Patient's thoughts are disorganized, speech is pressured and tangential."   Trauma: unknown  Substance abuse denies. Urine toxicology negative. Alcohol below the detection limit. Patient is a heavy smoker.  He is currently on forced medication.  8/11 Patient seen in his room this morning his sitting in his bed with a bright smile. States he is doing quite well. He then makes some inappropriate comments to this physician. He starts winking.   8/12 Patient was seen in his room this morning. He has a sheet over his head and states that he wants to rest and would not talk. Per staff patient continues to be disorganized and irritable. He has been observed to be having  that she told his head since last evening per staff notes. He has insisted with staff that he wants to be left alone. He has not been aggressive on the unit. He has not been attending any groups.  8/13 Patient seen in room wearing scrubs and washing feet in bed with washcloth. He continues to state that he does not want to be here and he is not happy that he has been forced to take medications. He is concerned about receiving injection medication because "he needs a shot like he needs a shot in the head, with a bullet". Patient desires physician to call his brother and gives a phone number. Patient reports that he was given a Bible and he is pleased and extends his thanks. Denies SI/HI/Hallucinations.   8/14 the patient was less irritable today. He actually answer questions let this writer speak. He said he slept well. He denies problems with current medications, denies side effects or physical complaints. Says his appetite is little. Denies any hallucinations or suicidal ideation. Still very upset about the police showing out at his place. He reluctantly taking his medications.  Per Nursing Pt was actually pleasant this shift. Pt was not verbally aggressive. Pt was med compliant, no adverse effects noted. Pt denies pain. Denies SI, HI or A/V hallucinations. Pt contracted to safety. No distress noted. 15 min safety continue. Pt slept 7 hrs.  Principal Problem: Schizophrenia ([place]) Diagnosis:   Patient Active Problem List   Diagnosis Date Noted  . Schizophrenia ([place]) [F20.9] 01/11/2017  . Tobacco use disorder [F17.200]  11/29/2016   Total Time spent with patient: 30 minutes  Past Psychiatric History: Patient was seeing a psychiatrist at [facility] He has been treated in the past with Prolixin injectable   Past Medical History:  Past Medical History:  Diagnosis Date  . Schizophrenia ([place])    History reviewed. No pertinent surgical history. Family History: History reviewed. No  pertinent family history. Family Psychiatric  History: Unknown Social History:  History  Alcohol Use No     History  Drug Use No    Social History   Social History  . Marital status: Single    Spouse name: N/A  . Number of children: N/A  . Years of education: N/A   Social History Main Topics  . Smoking status: Current Every Day Smoker    Packs/day: 1.00    Years: 15.00  . Smokeless tobacco: Never Used  . Alcohol use No  . Drug use: No  . Sexual activity: No   Other Topics Concern  . None   Social History Narrative  . None     Current Medications: Current Facility-Administered Medications  Medication Dose Route Frequency Provider Last Rate Last Dose  . acetaminophen (TYLENOL) tablet 650 mg  650 mg Oral Q6H PRN [name], [name], MD      . alum & mag hydroxide-simeth (MAALOX/MYLANTA) 200-200-20 MG/5ML suspension 30 mL  30 mL Oral Q4H PRN [name], [name], MD      . divalproex (DEPAKOTE) DR tablet 500 mg  500 mg Oral Q12H Hildred Priest, MD   500 mg at 01/16/17 1057  . fluPHENAZine (PROLIXIN) injection 5 mg  5 mg Intramuscular BID Hildred Priest, MD      . fluPHENAZine (PROLIXIN) tablet 5 mg  5 mg Oral BID Hildred Priest, MD   5 mg at 01/16/17 1057  . [START ON 01/25/2017] fluPHENAZine decanoate (PROLIXIN) injection 50 mg  50 mg Intramuscular Q14 Days Hildred Priest, MD      . magnesium hydroxide (MILK OF MAGNESIA) suspension 30 mL  30 mL Oral Daily PRN [name], [name], MD      . nicotine (NICODERM CQ - dosed in mg/24 hours) patch 21 mg  21 mg Transdermal Daily Hernandez-Gonzalez, Seth Bake, MD      . zolpidem (AMBIEN) tablet 10 mg  10 mg Oral QHS Hildred Priest, MD   10 mg at 01/15/17 2218    Lab Results:  No results found for this or any previous visit (from the past 48 hour(s)).  Blood Alcohol level:  Lab Results  Component Value Date   ETH <5 01/11/2017   ETH <5 95/62/1308    Metabolic Disorder Labs: Lab  Results  Component Value Date   HGBA1C 5.3 01/12/2017   MPG 105.41 01/12/2017   MPG 114 12/13/2016   No results found for: PROLACTIN Lab Results  Component Value Date   CHOL 149 01/12/2017   TRIG 48 01/12/2017   HDL 51 01/12/2017   CHOLHDL 2.9 01/12/2017   VLDL 10 01/12/2017   LDLCALC 88 01/12/2017   LDLCALC 65 12/13/2016    Physical Findings: AIMS: Facial and Oral Movements Muscles of Facial Expression: None, normal Lips and Perioral Area: None, normal Jaw: None, normal Tongue: None, normal,Extremity Movements Upper (arms, wrists, hands, fingers): None, normal Lower (legs, knees, ankles, toes): None, normal, Trunk Movements Neck, shoulders, hips: None, normal, Overall Severity Severity of abnormal movements (highest score from questions above): None, normal Incapacitation due to abnormal movements: None, normal Patient's awareness of abnormal movements (rate only  patient's report): No Awareness, Dental Status Current problems with teeth and/or dentures?: No Does patient usually wear dentures?: Yes  CIWA:    COWS:  COWS Total Score: 0  Musculoskeletal: Strength & Muscle Tone: within normal limits Gait & Station: normal Patient leans: N/A  Psychiatric Specialty Exam: Physical Exam  Constitutional: He is oriented to person, place, and time. He appears well-developed and well-nourished.  HENT:  Head: Normocephalic and atraumatic.  Eyes: EOM are normal.  Neck: Normal range of motion.  Respiratory: Effort normal.  Musculoskeletal: Normal range of motion.  Neurological: He is alert and oriented to person, place, and time.  Psychiatric: He expresses no homicidal and no suicidal ideation. He expresses no suicidal plans and no homicidal plans.    Review of Systems  Constitutional: Negative.   HENT: Negative.   Eyes: Negative.   Respiratory: Negative.   Cardiovascular: Negative.   Gastrointestinal: Negative.   Genitourinary: Negative.   Musculoskeletal: Negative.    Skin: Negative.   Neurological: Negative.   Endo/Heme/Allergies: Negative.   Psychiatric/Behavioral: Negative for depression, hallucinations, memory loss, substance abuse and suicidal ideas. The patient is not nervous/anxious and does not have insomnia.     Blood pressure 101/78, pulse (!) 128, temperature 98.1 F (36.7 C), temperature source Oral, resp. rate 18, height 5\' 9"  (1.753 m), weight 72.3 kg (159 lb 6.4 oz), SpO2 100 %.Body mass index is 23.54 kg/m.  General Appearance: Casual  Eye Contact:  Fair  Speech:  Garbled  Volume:  Normal  Mood:  Euthymic  Affect:  Appropriate and Congruent  Thought Process:  Linear and Descriptions of Associations: Loose  Orientation:  Full (Time, Place, and Person)  Thought Content:  Paranoid Ideation  Suicidal Thoughts:  No  Homicidal Thoughts:  No  Memory:  Immediate;   Poor Recent;   Poor Remote;   Poor  Judgement:  Impaired  Insight:  Lacking  Psychomotor Activity:  Increased  Concentration:  Concentration: Fair and Attention Span: Fair  Recall:  Poor  Fund of Knowledge:  Poor  Language:  Fair  Akathisia:  No  Handed:  Right  AIMS (if indicated):     Assets:  Communication Skills  ADL's:  Intact  Cognition:  Impaired,  Mild  Sleep:  Number of Hours: 7     Treatment Plan Summary: Daily contact with patient to assess and evaluate symptoms and progress in treatment and Medication management   A 61 year old male with schizophrenia. Apparently the patient has encountered greatest difficulties in order to receive his long acting injectable medication. Per [facility] collateral information the patient has not been receiving the injection at [facility] because they didn't have a nurse. Apparently [facility] is unable to see him due to his insurance. D/c on 7/11 on prolixin dec and likely has not received the inj since discharge  Schizoaffective: received prolixin dec 50 mg on 8/9 while in the ER.  Pt has been refusing oral medications. Since 8/10 on    non emergency forced medications.   Continue Prolixin liq 5 mg BID and if  he refuses there are orders for prolixin IM 5 mg BID--has been taking orally since Friday---Improving  Mood sx: Continue Depakote 500 mg po BID---will need a level prior to discharge  Tobacco use d/o: Ordered nicotine patch 21 mg  Agitation: not agitated today.  No major incidents since Friday  Insomnia: continue ambien 10 mg po qhs---slept well last night  Vital signs tid--tachycardic will check for orthostatics  Diet regular, encourage fluids  Precautions:  Fall precautions, every 15 minute checks  Hospitalization status: involuntary commitment  Collateral information is needed from patient's family.  Pt is in need of ACT---will need IPRS founding as he only has medicare.  Possible d/c 5-7 days.   Hildred Priest, MD 01/16/2017, 12:19 PM

## 2017-01-16 NOTE — B[facility] Group Notes (Signed)
[place] LCSW Group Therapy Note  Date/Time: 01/16/17, 1500  Type of Therapy/Topic:  Group Therapy:  Feelings about Diagnosis  Participation Level:  Did Not Attend   Mood:   Description of Group:    This group will allow patients to explore their thoughts and feelings about diagnoses they have received. Patients will be guided to explore their level of understanding and acceptance of these diagnoses. Facilitator will encourage patients to process their thoughts and feelings about the reactions of others to their diagnosis, and will guide patients in identifying ways to discuss their diagnosis with significant others in their lives. This group will be process-oriented, with patients participating in exploration of their own experiences as well as giving and receiving support and challenge from other group members.   Therapeutic Goals: 1. Patient will demonstrate understanding of diagnosis as evidence by identifying two or more symptoms of the disorder:  2. Patient will be able to express two feelings regarding the diagnosis 3. Patient will demonstrate ability to communicate their needs through discussion and/or role plays  Summary of Patient Progress:        Therapeutic Modalities:   Cognitive Behavioral Therapy Brief Therapy Feelings Identification   Lurline Idol, LCSW

## 2017-01-16 NOTE — BHH Suicide Risk Assessment (Signed)
[place] INPATIENT:  Family/Significant Other Suicide Prevention Education  Suicide Prevention Education:  Education Completed; brother, Abdishakur Gottschall ph#: [phone] has been identified by the patient as the family member/significant other with whom the patient will be residing, and identified as the person(s) who will aid the patient in the event of a mental health crisis (suicidal ideations/suicide attempt).  With written consent from the patient, the family member/significant other has been provided the following suicide prevention education, prior to the and/or following the discharge of the patient.  The suicide prevention education provided includes the following:  Suicide risk factors  Suicide prevention and interventions  National Suicide Hotline telephone number  [facility] assessment telephone number  [facility] Emergency Assistance [place] and/or Residential Mobile Crisis Unit telephone number  Request made of family/significant other to:  Remove weapons (e.g., guns, rifles, knives), all items previously/currently identified as safety concern.    Remove drugs/medications (over-the-counter, prescriptions, illicit drugs), all items previously/currently identified as a safety concern.  The family member/significant other verbalizes understanding of the suicide prevention education information provided.  The family member/significant other agrees to remove the items of safety concern listed above.  Emilie Rutter, MSW, LCSW-A 01/16/2017, 10:22 AM

## 2017-01-16 NOTE — Progress Notes (Addendum)
Pt is in pleasant mood. Med compliant, no adverse affects noted. Pt continues to be isolative, stays in room most of the shift. Denies SI, HI or A/V hallucinations. 15 min safety continues. Pt slept 6.5  hours.

## 2017-01-16 NOTE — Progress Notes (Signed)
Patient was irritable at times.Reluctant to take medicines all the time.Denies suicidal or homicidal ideations and AV hallucinations.Patient states "I don't need to be here".Minimal interactions with peers.Did not attend groups.Appetite and energy level good.Support & encouragement given.

## 2017-01-16 NOTE — Plan of Care (Signed)
Problem: Education: Goal: Will be free of psychotic symptoms Outcome: Progressing Pt will remain free from psychotic episodes the entire shift.

## 2017-01-16 NOTE — Plan of Care (Signed)
Problem: Coping: Goal: Ability to cope will improve Outcome: Progressing Less irritable today

## 2017-01-17 MED ORDER — ARIPIPRAZOLE 10 MG PO TABS
10.0000 mg | ORAL_TABLET | Freq: Every day | ORAL | Status: DC
  Administered 2017-01-17 – 2017-01-18 (×2): 10 mg via ORAL
  Filled 2017-01-17 (×2): qty 1

## 2017-01-17 MED ORDER — FLUPHENAZINE HCL 5 MG PO TABS
2.5000 mg | ORAL_TABLET | Freq: Three times a day (TID) | ORAL | Status: DC
  Administered 2017-01-17 – 2017-01-19 (×6): 2.5 mg via ORAL
  Filled 2017-01-17 (×6): qty 1

## 2017-01-17 NOTE — Tx Team (Signed)
Interdisciplinary Treatment and Diagnostic Plan Update  01/17/2017 Time of Session: 11:30 AM Dustin [name] MRN: [id number]  Principal Diagnosis: Schizophrenia [facility])  Secondary Diagnoses: Principal Problem:   Schizophrenia ([place]) Active Problems:   Tobacco use disorder   Current Medications:  Current Facility-Administered Medications  Medication Dose Route Frequency Provider Last Rate Last Dose  . acetaminophen (TYLENOL) tablet 650 mg  650 mg Oral Q6H PRN [name], [name], MD      . alum & mag hydroxide-simeth (MAALOX/MYLANTA) 200-200-20 MG/5ML suspension 30 mL  30 mL Oral Q4H PRN [name], [name], MD      . divalproex (DEPAKOTE) DR tablet 500 mg  500 mg Oral Q12H Hildred Priest, MD   500 mg at 01/17/17 0830  . fluPHENAZine (PROLIXIN) tablet 2.5 mg  2.5 mg Oral Q8H Hildred Priest, MD      . Derrill Memo ON 01/25/2017] fluPHENAZine decanoate (PROLIXIN) injection 50 mg  50 mg Intramuscular Q14 Days Hildred Priest, MD      . magnesium hydroxide (MILK OF MAGNESIA) suspension 30 mL  30 mL Oral Daily PRN [name], [name], MD      . nicotine (NICODERM CQ - dosed in mg/24 hours) patch 21 mg  21 mg Transdermal Daily Hildred Priest, MD      . zolpidem (AMBIEN) tablet 10 mg  10 mg Oral QHS Hildred Priest, MD   10 mg at 01/16/17 2125   PTA Medications: Prescriptions Prior to Admission  Medication Sig Dispense Refill Last Dose  . diphenhydrAMINE (BENADRYL) 25 mg capsule Take 1 capsule (25 mg total) by mouth at bedtime. 30 capsule 1   . fluPHENAZine decanoate (PROLIXIN) 25 MG/ML injection Inject 2 mLs (50 mg total) into the muscle every 14 (fourteen) days. 5 mL 1   . zolpidem (AMBIEN) 10 MG tablet Take 1 tablet (10 mg total) by mouth at bedtime. 30 tablet 0     Patient Stressors: Financial difficulties Medication change or noncompliance  Patient Strengths: Technical sales engineer for treatment/growth  Treatment Modalities:  Medication Management, Group therapy, Case management,  1 to 1 session with clinician, Psychoeducation, Recreational therapy.   Physician Treatment Plan for Primary Diagnosis: Schizophrenia ([place]) Long Term Goal(s): Improvement in symptoms so as ready for discharge Improvement in symptoms so as ready for discharge   Short Term Goals: Ability to demonstrate self-control will improve Compliance with prescribed medications will improve Ability to identify changes in lifestyle to reduce recurrence of condition will improve Ability to identify and develop effective coping behaviors will improve  Medication Management: Evaluate patient's response, side effects, and tolerance of medication regimen.  Therapeutic Interventions: 1 to 1 sessions, Unit Group sessions and Medication administration.  Evaluation of Outcomes: Not Met  Physician Treatment Plan for Secondary Diagnosis: Principal Problem:   Schizophrenia ([place]) Active Problems:   Tobacco use disorder  Long Term Goal(s): Improvement in symptoms so as ready for discharge Improvement in symptoms so as ready for discharge   Short Term Goals: Ability to demonstrate self-control will improve Compliance with prescribed medications will improve Ability to identify changes in lifestyle to reduce recurrence of condition will improve Ability to identify and develop effective coping behaviors will improve     Medication Management: Evaluate patient's response, side effects, and tolerance of medication regimen.  Therapeutic Interventions: 1 to 1 sessions, Unit Group sessions and Medication administration.  Evaluation of Outcomes: Not Met   RN Treatment Plan for Primary Diagnosis: Schizophrenia ([place]) Long Term Goal(s): Knowledge of disease and therapeutic regimen to  maintain health will improve  Short Term Goals: Ability to remain free from injury will improve, Ability to participate in decision making will improve and Compliance with  prescribed medications will improve  Medication Management: RN will administer medications as ordered by provider, will assess and evaluate patient's response and provide education to patient for prescribed medication. RN will report any adverse and/or side effects to prescribing provider.  Therapeutic Interventions: 1 on 1 counseling sessions, Psychoeducation, Medication administration, Evaluate responses to treatment, Monitor vital signs and CBGs as ordered, Perform/monitor CIWA, COWS, AIMS and Fall Risk screenings as ordered, Perform wound care treatments as ordered.  Evaluation of Outcomes: Progressing   LCSW Treatment Plan for Primary Diagnosis: Schizophrenia ([place]) Long Term Goal(s): Safe transition to appropriate next level of care at discharge, Engage patient in therapeutic group addressing interpersonal concerns.  Short Term Goals: Engage patient in aftercare planning with referrals and resources, Increase social support and Increase skills for wellness and recovery  Therapeutic Interventions: Assess for all discharge needs, 1 to 1 time with Social worker, Explore available resources and support systems, Assess for adequacy in community support network, Educate family and significant other(s) on suicide prevention, Complete Psychosocial Assessment, Interpersonal group therapy.  Evaluation of Outcomes: Progressing   Progress in Treatment: Attending groups: No. Participating in groups: No. Taking medication as prescribed: Yes. Toleration medication: Yes. Family/Significant other contact made: Yes, CSW spoke with pt's brother Patient understands diagnosis: No. Discussing patient identified problems/goals with staff: Yes. Medical problems stabilized or resolved: Yes. Denies suicidal/homicidal ideation: Yes. Issues/concerns per patient self-inventory: No.  New problem(s) identified: No, Describe:  None identified.  New Short Term/Long Term Goal(s): Patient unable to make goal at  this time.  Discharge Plan or Barriers: CSW assessing appropriate discharge plan.  Reason for Continuation of Hospitalization: Hallucinations Medication stabilization  Estimated Length of Stay: 3-5 days   Attendees: Patient: Dustin [name] 01/17/2017 11:01 AM  Physician: Dr. Merlyn Albert, MD  01/17/2017 11:01 AM  Nursing: Polly Cobia, RN  01/17/2017 11:01 AM  RN Care Manager: 01/17/2017 11:01 AM  Social Worker: Glorious Peach, MSW, LCSW-A 01/17/2017 11:01 AM  Recreational Therapist: Drue Flirt, LRT, CTRS  01/17/2017 11:01 AM  Other:  01/17/2017 11:01 AM  Other:  01/17/2017 11:01 AM  Other: 01/17/2017 11:01 AM    Scribe for Treatment Team: Emilie Rutter, [place] 01/17/2017 11:01 AM

## 2017-01-17 NOTE — Progress Notes (Addendum)
[facility] MD Progress Note  01/17/2017 9:16 AM Dustin [name]  MRN:  [id number] Subjective:  Dustin [name] an 61 y.o.male with Schizophrenia who presented to ED under IVC on 8/9. Pt was agitated, loud, uncooperative, paranoid and verbally aggressive. Per petition "trying to insight police to kill him".  According to patient he was at home when police came and entered his home. He does not know why they came. He makes repetitive religious references and reports that his aunt is working with a devil Dr. He endorses recent auditory hallucinations but denies any currently. Denies SI HI or visual hallucinations. Denies taking any antipsychotic or mood stabilizing medications.  Discharge from our unit on 7/11.  At that time he was disorganized and delusional. Place on forced meds.  Finally stabilized on prolixin po and decanoate. He was discharged back to his brother's house.    I imagine he did not f/u or take any meds after discharge.    Per nursing staff: "Patient appears irritable and angry using profanities, affect is blunted, patient was not calm during admission process, he kept using vulgar words and sexual explicit terms and was often told to be mindful of such words. Patient denies SI/AVH but noted responding to internal stimuli. Patient's thoughts are disorganized, speech is pressured and tangential."   Trauma: unknown  Substance abuse denies. Urine toxicology negative. Alcohol below the detection limit. Patient is a heavy smoker.  He is currently on forced medication.  8/11 Patient seen in his room this morning his sitting in his bed with a bright smile. States he is doing quite well. He then makes some inappropriate comments to this physician. He starts winking.   8/12 Patient was seen in his room this morning. He has a sheet over his head and states that he wants to rest and would not talk. Per staff patient continues to be disorganized and irritable. He has been observed to be having  that she told his head since last evening per staff notes. He has insisted with staff that he wants to be left alone. He has not been aggressive on the unit. He has not been attending any groups.  8/13 Patient seen in room wearing scrubs and washing feet in bed with washcloth. He continues to state that he does not want to be here and he is not happy that he has been forced to take medications. He is concerned about receiving injection medication because "he needs a shot like he needs a shot in the head, with a bullet". Patient desires physician to call his brother and gives a phone number. Patient reports that he was given a Bible and he is pleased and extends his thanks. Denies SI/HI/Hallucinations.   8/14 the patient was less irritable today. He actually answer questions let this writer speak. He said he slept well. He denies problems with current medications, denies side effects or physical complaints. Says his appetite is little. Denies any hallucinations or suicidal ideation. Still very upset about the police showing out at his place. He reluctantly taking his medications.  8/15 Pt has been compliant with oral meds but is still very reluctant about taking them.  Denies SI/HI or hallucinations. Denies SE from medications other than mild constipation.  Per staff he is eating and sleeping well.  During assessment today he was hyper religious, talking about the magic in the bible, vooddo and courses.  Thought process was disorganized and hard to follow  Per Nursing Patient was irritable at times.Reluctant to  take medicines all the time.Denies suicidal or homicidal ideations and AV hallucinations.Patient states "I don't need to be here".Minimal interactions with peers.Did not attend groups.Appetite and energy level good.Support & encouragement given.  Principal Problem: Schizophrenia ([place]) Diagnosis:   Patient Active Problem List   Diagnosis Date Noted  . Schizophrenia ([place]) [F20.9] 01/11/2017  .  Tobacco use disorder [F17.200] 11/29/2016   Total Time spent with patient: 30 minutes  Past Psychiatric History: Patient was seeing a psychiatrist at [facility]. He has been treated in the past with Prolixin injectable   Past Medical History:  Past Medical History:  Diagnosis Date  . Schizophrenia ([place])    History reviewed. No pertinent surgical history.  Family History: History reviewed. No pertinent family history.   Family Psychiatric  History: Unknown  Social History:  History  Alcohol Use No     History  Drug Use No    Social History   Social History  . Marital status: Single    Spouse name: N/A  . Number of children: N/A  . Years of education: N/A   Social History Main Topics  . Smoking status: Current Every Day Smoker    Packs/day: 1.00    Years: 15.00  . Smokeless tobacco: Never Used  . Alcohol use No  . Drug use: No  . Sexual activity: No   Other Topics Concern  . None   Social History Narrative  . None     Current Medications: Current Facility-Administered Medications  Medication Dose Route Frequency Provider Last Rate Last Dose  . acetaminophen (TYLENOL) tablet 650 mg  650 mg Oral Q6H PRN [name], [name], MD      . alum & mag hydroxide-simeth (MAALOX/MYLANTA) 200-200-20 MG/5ML suspension 30 mL  30 mL Oral Q4H PRN [name], [name], MD      . divalproex (DEPAKOTE) DR tablet 500 mg  500 mg Oral Q12H Hildred Priest, MD   500 mg at 01/17/17 0830  . fluPHENAZine (PROLIXIN) injection 5 mg  5 mg Intramuscular BID Hildred Priest, MD      . fluPHENAZine (PROLIXIN) tablet 5 mg  5 mg Oral BID Hildred Priest, MD   5 mg at 01/17/17 0829  . [START ON 01/25/2017] fluPHENAZine decanoate (PROLIXIN) injection 50 mg  50 mg Intramuscular Q14 Days Hildred Priest, MD      . magnesium hydroxide (MILK OF MAGNESIA) suspension 30 mL  30 mL Oral Daily PRN [name], [name], MD      . nicotine (NICODERM CQ - dosed in mg/24  hours) patch 21 mg  21 mg Transdermal Daily Hildred Priest, MD      . zolpidem (AMBIEN) tablet 10 mg  10 mg Oral QHS Hildred Priest, MD   10 mg at 01/16/17 2125    Lab Results:  No results found for this or any previous visit (from the past 48 hour(s)).  Blood Alcohol level:  Lab Results  Component Value Date   ETH <5 01/11/2017   ETH <5 16/03/9603    Metabolic Disorder Labs: Lab Results  Component Value Date   HGBA1C 5.3 01/12/2017   MPG 105.41 01/12/2017   MPG 114 12/13/2016   No results found for: PROLACTIN Lab Results  Component Value Date   CHOL 149 01/12/2017   TRIG 48 01/12/2017   HDL 51 01/12/2017   CHOLHDL 2.9 01/12/2017   VLDL 10 01/12/2017   LDLCALC 88 01/12/2017   LDLCALC 65 12/13/2016    Physical Findings: AIMS: Facial and Oral Movements Muscles of  Facial Expression: None, normal Lips and Perioral Area: None, normal Jaw: None, normal Tongue: None, normal,Extremity Movements Upper (arms, wrists, hands, fingers): None, normal Lower (legs, knees, ankles, toes): None, normal, Trunk Movements Neck, shoulders, hips: None, normal, Overall Severity Severity of abnormal movements (highest score from questions above): None, normal Incapacitation due to abnormal movements: None, normal Patient's awareness of abnormal movements (rate only patient's report): No Awareness, Dental Status Current problems with teeth and/or dentures?: No Does patient usually wear dentures?: Yes  CIWA:    COWS:  COWS Total Score: 0  Musculoskeletal: Strength & Muscle Tone: within normal limits Gait & Station: normal Patient leans: N/A  Psychiatric Specialty Exam: Physical Exam  Constitutional: He is oriented to person, place, and time. He appears well-developed and well-nourished.  HENT:  Head: Normocephalic and atraumatic.  Eyes: EOM are normal.  Neck: Normal range of motion.  Respiratory: Effort normal.  Musculoskeletal: Normal range of motion.   Neurological: He is alert and oriented to person, place, and time.  Psychiatric: He expresses no homicidal and no suicidal ideation. He expresses no suicidal plans and no homicidal plans.    Review of Systems  Constitutional: Negative.   HENT: Negative.   Eyes: Negative.   Respiratory: Negative.   Cardiovascular: Negative.   Gastrointestinal: Negative.   Genitourinary: Negative.   Musculoskeletal: Negative.   Skin: Negative.   Neurological: Negative.   Endo/Heme/Allergies: Negative.   Psychiatric/Behavioral: Negative for depression, hallucinations, memory loss, substance abuse and suicidal ideas. The patient is not nervous/anxious and does not have insomnia.     Blood pressure 122/73, pulse 64, temperature 98.5 F (36.9 C), temperature source Oral, resp. rate 18, height 5\' 9"  (1.753 m), weight 72.3 kg (159 lb 6.4 oz), SpO2 100 %.Body mass index is 23.54 kg/m.  General Appearance: Casual  Eye Contact:  Fair  Speech:  Garbled  Volume:  Normal  Mood:  Euthymic  Affect:  Appropriate and Congruent  Thought Process:  Linear and Descriptions of Associations: Loose  Orientation:  Full (Time, Place, and Person)  Thought Content:  Paranoid Ideation  Suicidal Thoughts:  No  Homicidal Thoughts:  No  Memory:  Immediate;   Poor Recent;   Poor Remote;   Poor  Judgement:  Impaired  Insight:  Lacking  Psychomotor Activity:  Increased  Concentration:  Concentration: Fair and Attention Span: Fair  Recall:  Poor  Fund of Knowledge:  Poor  Language:  Fair  Akathisia:  No  Handed:  Right  AIMS (if indicated):     Assets:  Communication Skills  ADL's:  Intact  Cognition:  Impaired,  Mild  Sleep:  Number of Hours: 6.5     Treatment Plan Summary: Daily contact with patient to assess and evaluate symptoms and progress in treatment and Medication management   A 61 year old male with schizophrenia. Apparently the patient has encountered greatest difficulties in order to receive his long  acting injectable medication. Per [facility] collateral information the patient has not been receiving the injection at [facility] because they didn't have a nurse. Apparently [facility] is unable to see him due to his insurance. D/c on 7/11 on prolixin dec and likely has not received the inj since discharge  Schizoaffective: received prolixin dec 50 mg on 8/9 while in the ER.  Pt was refusing oral medications upon admission. Ordered  non emergency forced medications on 8/10.   He has been taking orally since Friday---Improving  Will decrease dose of prolixin to 2.5 mg tid instead of 5 mg bid  due to orthostatic hypotension and tachycardia. Due to refractory psychosis will add a second antipsychotic unlikely to cause issues with BP. Will start abilify 10 mg po q am.    Mood sx: Continue Depakote 500 mg po BID---will need a level prior to discharge  Tobacco use d/o: Ordered nicotine patch 21 mg  Agitation: not agitated today.  No major incidents since Friday  Insomnia: continue ambien 10 mg po qhs---slept well last night  Vital signs tid--tachycardic will check for orthostatics.  Did have orthostatic + this am  Diet regular, encourage fluids  Precautions: Fall precautions, every 15 minute checks  Hospitalization status: involuntary commitment  Collateral information is needed from patient's family.  Pt is in need of ACT---will need [employer] founding as he only has medicare.  SW tells me that [employer] will not pay for services if pt has medicaid  Possible d/c 7 days. Still very symptomatic  I will order EKG today as he has been tachycardic at times.  HR this morning was 64.  Will check depakote level in am  SW advised the brother to pursue guardianship   Hildred Priest, MD 01/17/2017, 9:16 AM

## 2017-01-17 NOTE — Progress Notes (Signed)
Recreation Therapy Notes  Date: 08.15.18 Time: 9:30 am Location: Craft Room  Group Topic: Self-esteem  Goal Area(s) Addresses:  Patient will write at least one positive trait about self. Patient will verbalize benefit of having a healthy self-esteem.  Behavioral Response: Did not attend  Intervention: I Am  Activity: Patients were given worksheets with the letter I on it and were instructed to write as many positive traits about themselves inside the letter.  Education: LRT educated patients on ways they can increase their self-esteem.  Education Outcome: Patient did not attend group.  Clinical Observations/Feedback: Patient did not attend group.  Leonette Monarch, LRT/CTRS 01/17/2017 10:00 AM

## 2017-01-17 NOTE — Progress Notes (Signed)
Pt has more pleasant mood today. He denies SI, HI, a/v hallucinations. Pt is compliant with all medications. He is visible on the unit but can be isolative to room. He appears to be responding to internal stimuli rambling to self and laughing. Will continue to monitor.

## 2017-01-17 NOTE — Progress Notes (Signed)
Contacted Dr Jerilee Hoh in reference to pt being hypertensive and  Tachycardic. No new orders given at this time.

## 2017-01-17 NOTE — Progress Notes (Signed)
Pt remains safe while in hospital injury free.  No aggressive or hostile behaviors noted.

## 2017-01-17 NOTE — [facility] Group Notes (Signed)
[place] Group Notes:  (Nursing/MHT/Case Management/Adjunct)  Date:  01/17/2017  Time:  10:16 PM  Type of Therapy:  Group Therapy  Participation Level:  Did Not Attend  Participation Quality: Summary of Progress/Problems:  Dustin [name] 01/17/2017, 10:16 PM

## 2017-01-18 LAB — VALPROIC ACID LEVEL: Valproic Acid Lvl: 76 ug/mL (ref 50.0–100.0)

## 2017-01-18 MED ORDER — ARIPIPRAZOLE 5 MG PO TABS
15.0000 mg | ORAL_TABLET | Freq: Every day | ORAL | Status: DC
Start: 2017-01-19 — End: 2017-01-25
  Administered 2017-01-19 – 2017-01-25 (×7): 15 mg via ORAL
  Filled 2017-01-18 (×7): qty 1

## 2017-01-18 NOTE — [facility] Group Notes (Signed)
  [place] LCSW Group Therapy Note  Date/Time: 01/17/17, 1300  Type of Therapy/Topic:  Group Therapy:  Emotion Regulation  Participation Level:  Active   Mood: pleasant  Description of Group:    The purpose of this group is to assist patients in learning to regulate negative emotions and experience positive emotions. Patients will be guided to discuss ways in which they have been vulnerable to their negative emotions. These vulnerabilities will be juxtaposed with experiences of positive emotions or situations, and patients challenged to use positive emotions to combat negative ones. Special emphasis will be placed on coping with negative emotions in conflict situations, and patients will process healthy conflict resolution skills.  Therapeutic Goals: 1. Patient will identify two positive emotions or experiences to reflect on in order to balance out negative emotions:  2. Patient will label two or more emotions that they find the most difficult to experience:  3. Patient will be able to demonstrate positive conflict resolution skills through discussion or role plays:   Summary of Patient Progress: Pt was very active in group and was off topic a good portion of the time. Pt talking frequently about problems he is having with his family and how they send him in the hospital because he handles conflict by staying away from them.  Pt was pleasant and CSW was able to redirect him when needed.       Therapeutic Modalities:   Cognitive Behavioral Therapy Feelings Identification Dialectical Behavioral Therapy  Lurline Idol, LCSW

## 2017-01-18 NOTE — Progress Notes (Signed)
Patient ID: Dustin [name], male   DOB: 12/04/1955, 61 y.o.   MRN: [id number] [name] is happy, laughing and cracking jokes, polite, calm, descent, interacting well with peers; c/o constipation and received MOM no results yet; denies SI/HI/AVH; joked about when he gets upset as "my dissociative identity personality; he is mean, he doesn't take medications..." Patient was encouraged to continue with treatments and medications.

## 2017-01-18 NOTE — Plan of Care (Signed)
Problem: Activity: Goal: Sleeping patterns will improve Outcome: Progressing Patient slept for Estimated Hours of 6; Precautionary checks every 15 minutes for safety maintained, room free of safety hazards, patient sustains no injury or falls during this shift.    

## 2017-01-18 NOTE — [facility] Group Notes (Signed)
[facility] LCSW Group Therapy Note  Date/Time: 01/18/17, 1300  Type of Therapy/Topic:  Group Therapy:  Balance in Life  Participation Level:  Did not attend  Description of Group:    This group will address the concept of balance and how it feels and looks when one is unbalanced. Patients will be encouraged to process areas in their lives that are out of balance, and identify reasons for remaining unbalanced. Facilitators will guide patients utilizing problem- solving interventions to address and correct the stressor making their life unbalanced. Understanding and applying boundaries will be explored and addressed for obtaining  and maintaining a balanced life. Patients will be encouraged to explore ways to assertively make their unbalanced needs known to significant others in their lives, using other group members and facilitator for support and feedback.  Therapeutic Goals: 1. Patient will identify two or more emotions or situations they have that consume much of in their lives. 2. Patient will identify signs/triggers that life has become out of balance:  3. Patient will identify two ways to set boundaries in order to achieve balance in their lives:  4. Patient will demonstrate ability to communicate their needs through discussion and/or role plays  Summary of Patient Progress:          Therapeutic Modalities:   Cognitive Behavioral Therapy Solution-Focused Therapy Assertiveness Training  Lurline Idol, LCSW

## 2017-01-18 NOTE — Progress Notes (Signed)
[facility] MD Progress Note  01/18/2017 11:21 AM JEMMIE RHINEHART  MRN:  [id number] Subjective:  NEWEL OIEN an 61 y.o.male with Schizophrenia who presented to ED under IVC on 8/9. Pt was agitated, loud, uncooperative, paranoid and verbally aggressive. Per petition "trying to insight police to kill him".  According to patient he was at home when police came and entered his home. He does not know why they came. He makes repetitive religious references and reports that his aunt is working with a devil Dr. He endorses recent auditory hallucinations but denies any currently. Denies SI HI or visual hallucinations. Denies taking any antipsychotic or mood stabilizing medications.  Discharge from our unit on 7/11.  At that time he was disorganized and delusional. Place on forced meds.  Finally stabilized on prolixin po and decanoate. He was discharged back to his brother's house.    I imagine he did not f/u or take any meds after discharge.    Per nursing staff: "Patient appears irritable and angry using profanities, affect is blunted, patient was not calm during admission process, he kept using vulgar words and sexual explicit terms and was often told to be mindful of such words. Patient denies SI/AVH but noted responding to internal stimuli. Patient's thoughts are disorganized, speech is pressured and tangential."   Trauma: unknown  Substance abuse denies. Urine toxicology negative. Alcohol below the detection limit. Patient is a heavy smoker.  He is currently on forced medication.  8/11 Patient seen in his room this morning his sitting in his bed with a bright smile. States he is doing quite well. He then makes some inappropriate comments to this physician. He starts winking.   8/12 Patient was seen in his room this morning. He has a sheet over his head and states that he wants to rest and would not talk. Per staff patient continues to be disorganized and irritable. He has been observed to be having  that she told his head since last evening per staff notes. He has insisted with staff that he wants to be left alone. He has not been aggressive on the unit. He has not been attending any groups.  8/13 Patient seen in room wearing scrubs and washing feet in bed with washcloth. He continues to state that he does not want to be here and he is not happy that he has been forced to take medications. He is concerned about receiving injection medication because "he needs a shot like he needs a shot in the head, with a bullet". Patient desires physician to call his brother and gives a phone number. Patient reports that he was given a Bible and he is pleased and extends his thanks. Denies SI/HI/Hallucinations.   8/14 the patient was less irritable today. He actually answer questions let this writer speak. He said he slept well. He denies problems with current medications, denies side effects or physical complaints. Says his appetite is little. Denies any hallucinations or suicidal ideation. Still very upset about the police showing out at his place. He reluctantly taking his medications.  8/15 Pt has been compliant with oral meds but is still very reluctant about taking them.  Denies SI/HI or hallucinations. Denies SE from medications other than mild constipation.  Per staff he is eating and sleeping well.  During assessment today he was hyper religious, talking about the magic in the bible, vooddo and courses.  Thought process was disorganized and hard to follow  8/16 patient says that medications dry his brain.  He said he is taking too many medications. He continues to believe that he does not need medications. It is very unlikely that he will continue any medications after discharge. He does not see why he is advised to take them. He says he does not have schizophrenia.  Says that he was kidnapped by police. He says being here is against his will. 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  Per Nursing [name] is happy, laughing and cracking jokes, polite, calm, descent, interacting well with peers; c/o constipation and received MOM no results yet; denies SI/HI/AVH; joked about when he gets upset as "my dissociative identity personality; he is mean, he doesn't take medications..." Patient was encouraged to continue with treatments and medications.  Pt has more pleasant mood today. He denies SI, HI, a/v hallucinations. Pt is compliant with all medications. He is visible on the unit but can be isolative to room. He appears to be responding to internal stimuli rambling to self and laughing. Will continue to monitor.   Principal Problem: Schizophrenia ([place]) Diagnosis:   Patient Active Problem List   Diagnosis Date Noted  . Schizophrenia ([place]) [F20.9] 01/11/2017  . Tobacco use disorder [F17.200] 11/29/2016   Total Time spent with patient: 30 minutes  Past Psychiatric History: Patient was seeing a psychiatrist at [facility]. He has been treated in the past with Prolixin injectable   Past Medical History:  Past Medical History:  Diagnosis Date  . Schizophrenia ([place])    History reviewed. No pertinent surgical history.  Family History: History reviewed. No pertinent family history.   Family Psychiatric  History: Unknown  Social History:  History  Alcohol Use No     History  Drug Use No    Social History   Social History  . Marital status: Single    Spouse name: N/A  . Number of children: N/A  . Years of education: N/A   Social History Main Topics  . Smoking status: Current Every Day Smoker    Packs/day: 1.00    Years: 15.00  . Smokeless tobacco: Never Used  . Alcohol use No  . Drug use: No  . Sexual activity: No   Other Topics Concern  . None   Social History Narrative  . None     Current Medications: Current Facility-Administered Medications  Medication Dose Route Frequency Provider Last Rate  Last Dose  . acetaminophen (TYLENOL) tablet 650 mg  650 mg Oral Q6H PRN [name], [name], MD      . alum & mag hydroxide-simeth (MAALOX/MYLANTA) 200-200-20 MG/5ML suspension 30 mL  30 mL Oral Q4H PRN [name], [name], MD      . ARIPiprazole (ABILIFY) tablet 10 mg  10 mg Oral Daily Hildred Priest, MD   10 mg at 01/17/17 1357  . divalproex (DEPAKOTE) DR tablet 500 mg  500 mg Oral Q12H Hildred Priest, MD   500 mg at 01/17/17 1956  . fluPHENAZine (PROLIXIN) tablet 2.5 mg  2.5 mg Oral Q8H Hildred Priest, MD   2.5 mg at 01/18/17 0600  . [START ON 01/25/2017] fluPHENAZine decanoate (PROLIXIN) injection 50 mg  50 mg Intramuscular Q14 Days Hildred Priest, MD      . magnesium hydroxide (MILK OF MAGNESIA) suspension 30 mL  30 mL Oral Daily PRN [name], [name], MD   30 mL at 01/17/17 1949  . nicotine (NICODERM CQ - dosed in mg/24 hours) patch 21 mg  21 mg Transdermal Daily Hildred Priest, MD      . zolpidem Lorrin Mais) tablet 10 mg  10 mg Oral QHS Hildred Priest, MD   10 mg at 01/17/17 2147    Lab Results:  Results for orders placed or performed during the hospital encounter of 01/11/17 (from the past 48 hour(s))  Valproic acid level     Status: None   Collection Time: 01/18/17  6:38 AM  Result Value Ref Range   Valproic Acid Lvl 76 50.0 - 100.0 ug/mL    Blood Alcohol level:  Lab Results  Component Value Date   ETH <5 01/11/2017   ETH <5 19/62/2297    Metabolic Disorder Labs: Lab Results  Component Value Date   HGBA1C 5.3 01/12/2017   MPG 105.41 01/12/2017   MPG 114 12/13/2016   No results found for: PROLACTIN Lab Results  Component Value Date   CHOL 149 01/12/2017   TRIG 48 01/12/2017   HDL 51 01/12/2017   CHOLHDL 2.9 01/12/2017   VLDL 10 01/12/2017   LDLCALC 88 01/12/2017   LDLCALC 65 12/13/2016    Physical Findings: AIMS: Facial and Oral Movements Muscles of Facial Expression: None, normal Lips and Perioral  Area: None, normal Jaw: None, normal Tongue: None, normal,Extremity Movements Upper (arms, wrists, hands, fingers): None, normal Lower (legs, knees, ankles, toes): None, normal, Trunk Movements Neck, shoulders, hips: None, normal, Overall Severity Severity of abnormal movements (highest score from questions above): None, normal Incapacitation due to abnormal movements: None, normal Patient's awareness of abnormal movements (rate only patient's report): No Awareness, Dental Status Current problems with teeth and/or dentures?: No Does patient usually wear dentures?: Yes  CIWA:    COWS:  COWS Total Score: 0  Musculoskeletal: Strength & Muscle Tone: within normal limits Gait & Station: normal Patient leans: N/A  Psychiatric Specialty Exam: Physical Exam  Constitutional: He is oriented to person, place, and time. He appears well-developed and well-nourished.  HENT:  Head: Normocephalic and atraumatic.  Eyes: EOM are normal.  Neck: Normal range of motion.  Respiratory: Effort normal.  Musculoskeletal: Normal range of motion.  Neurological: He is alert and oriented to person, place, and time.  Psychiatric: He expresses no homicidal and no suicidal ideation. He expresses no suicidal plans and no homicidal plans.    Review of Systems  Constitutional: Negative.   HENT: Negative.   Eyes: Negative.   Respiratory: Negative.   Cardiovascular: Negative.   Gastrointestinal: Negative.   Genitourinary: Negative.   Musculoskeletal: Negative.   Skin: Negative.   Neurological: Negative.   Endo/Heme/Allergies: Negative.   Psychiatric/Behavioral: Negative for depression, hallucinations, memory loss, substance abuse and suicidal ideas. The patient is not nervous/anxious and does not have insomnia.     Blood pressure 105/82, pulse 72, temperature 98.2 F (36.8 C), resp. rate 18, height 5\' 9"  (1.753 m), weight 72.3 kg (159 lb 6.4 oz), SpO2 100 %.Body mass index is 23.54 kg/m.  General  Appearance: Casual  Eye Contact:  Fair  Speech:  Garbled  Volume:  Normal  Mood:  Euthymic  Affect:  Appropriate and Congruent  Thought Process:  Linear and Descriptions of Associations: Loose  Orientation:  Full (Time, Place, and Person)  Thought Content:  Paranoid Ideation  Suicidal Thoughts:  No  Homicidal Thoughts:  No  Memory:  Immediate;   Poor Recent;   Poor Remote;   Poor  Judgement:  Impaired  Insight:  Lacking  Psychomotor Activity:  Increased  Concentration:  Concentration: Fair and Attention Span: Fair  Recall:  Poor  Fund of Knowledge:  Poor  Language:  Fair  Akathisia:  No  Handed:  Right  AIMS (if indicated):     Assets:  Communication Skills  ADL's:  Intact  Cognition:  Impaired,  Mild  Sleep:  Number of Hours: 6     Treatment Plan Summary: Daily contact with patient to assess and evaluate symptoms and progress in treatment and Medication management   A 61 year old male with schizophrenia. Apparently the patient has encountered greatest difficulties in order to receive his long acting injectable medication. Per [facility] collateral information the patient has not been receiving the injection at [facility] because they didn't have a nurse. Apparently [facility] is unable to see him due to his insurance. D/c on 7/11 on prolixin dec and likely has not received the inj since discharge  Schizoaffective: received prolixin dec 50 mg on 8/9 while in the ER. Next dose on 8/23  Pt was refusing oral medications upon admission. Ordered  non emergency forced medications on 8/10.   He has been taking orally since Friday---Improving  Prolixin has been decreased to 2.5 mg tid instead of 5 mg bid due to orthostatic hypotension and tachycardia. Due to refractory psychosis  a second antipsychotic was added.  Now on abilify, will increase today from 10 mg to 15 mg    Mood sx: Continue Depakote 500 mg po BID---level on 8/16 was 76  Tobacco use d/o: Ordered nicotine patch 21  mg  Agitation: not agitated today.  No major incidents since Friday  Insomnia: continue ambien 10 mg po qhs---slept well last night  Vital signs tid--tachycardic will check for orthostatics.  Did have orthostatic yesterday, no today  Diet regular, encourage fluids  Precautions: Fall precautions, every 15 minute checks  Hospitalization status: involuntary commitment  Collateral information is needed from patient's family.  Pt is in need of ACT---will need [employer] founding as he only has medicare.  SW tells me that [employer] will not pay for services if pt has medicaid  Possible d/c 7 days. Still very symptomatic  EKG wnl  Depakote level on 8/16 was 42  SW advised the brother to pursue guardianship  Potential d/c next week after second dose of prolixin which is due on 8/23   Hildred Priest, MD 01/18/2017, 11:21 AM

## 2017-01-18 NOTE — Progress Notes (Signed)
[facility] MD Progress Note  01/18/2017 12:36 PM Dustin [name]  MRN:  [id number] Subjective:  Dustin [name] an 61 y.o.male with Schizophrenia who presented to ED under IVC on 8/9. Pt was agitated, loud, uncooperative, paranoid and verbally aggressive. Per petition "trying to insight police to kill him".  According to patient he was at home when police came and entered his home. He does not know why they came. He makes repetitive religious references and reports that his aunt is working with a devil Dr. He endorses recent auditory hallucinations but denies any currently. Denies SI HI or visual hallucinations. Denies taking any antipsychotic or mood stabilizing medications.  Discharge from our unit on 7/11.  At that time he was disorganized and delusional. Place on forced meds.  Finally stabilized on prolixin po and decanoate. He was discharged back to his brother's house.    I imagine he did not f/u or take any meds after discharge.    Per nursing staff: "Patient appears irritable and angry using profanities, affect is blunted, patient was not calm during admission process, he kept using vulgar words and sexual explicit terms and was often told to be mindful of such words. Patient denies SI/AVH but noted responding to internal stimuli. Patient's thoughts are disorganized, speech is pressured and tangential."   Trauma: unknown  Substance abuse denies. Urine toxicology negative. Alcohol below the detection limit. Patient is a heavy smoker.  He is currently on forced medication.  8/11 Patient seen in his room this morning his sitting in his bed with a bright smile. States he is doing quite well. He then makes some inappropriate comments to this physician. He starts winking.   8/12 Patient was seen in his room this morning. He has a sheet over his head and states that he wants to rest and would not talk. Per staff patient continues to be disorganized and irritable. He has been observed to be having  that she told his head since last evening per staff notes. He has insisted with staff that he wants to be left alone. He has not been aggressive on the unit. He has not been attending any groups.  8/13 Patient seen in room wearing scrubs and washing feet in bed with washcloth. He continues to state that he does not want to be here and he is not happy that he has been forced to take medications. He is concerned about receiving injection medication because "he needs a shot like he needs a shot in the head, with a bullet". Patient desires physician to call his brother and gives a phone number. Patient reports that he was given a Bible and he is pleased and extends his thanks. Denies SI/HI/Hallucinations.   8/14 the patient was less irritable today. He actually answer questions let this writer speak. He said he slept well. He denies problems with current medications, denies side effects or physical complaints. Says his appetite is little. Denies any hallucinations or suicidal ideation. Still very upset about the police showing out at his place. He reluctantly taking his medications.  8/15 Pt has been compliant with oral meds but is still very reluctant about taking them.  Denies SI/HI or hallucinations. Denies SE from medications other than mild constipation.  Per staff he is eating and sleeping well.  During assessment today he was hyper religious, talking about the magic in the bible, vooddo and courses.  Thought process was disorganized and hard to follow  8/16 Patient compliant with medications but is still  hesitant about them--he reports that the medications cause his "brain to dry up". He denies side effects from the medications, denies lightheadedness and dizziness. He reports he feels congested today, but declines any medication. He reports that his thoughts are clearer today, and is still adamant about continued communication with his brother so as to work on discharge "as soon as possible". Informed  of discharge likely early next week. Denies SI/HI/Hallucinations.  Per Nursing Patient slept for Estimated Hours of 6; Precautionary checks every 15 minutes for safety maintained, room free of safety hazards, patient sustains no injury or falls during this shift.  Dustin [name] is happy, laughing and cracking jokes, polite, calm, descent, interacting well with peers; c/o constipation and received MOM no results yet; denies SI/HI/AVH; joked about when he gets upset as "my dissociative identity personality; he is mean, he doesn't take medications..." Patient was encouraged to continue with treatments and medications.  Principal Problem: Schizophrenia ([place]) Diagnosis:   Patient Active Problem List   Diagnosis Date Noted  . Schizophrenia ([place]) [F20.9] 01/11/2017  . Tobacco use disorder [F17.200] 11/29/2016   Total Time spent with patient: 30 minutes  Past Psychiatric History: Patient was seeing a psychiatrist at [facility] He has been treated in the past with Prolixin injectable   Past Medical History:  Past Medical History:  Diagnosis Date  . Schizophrenia ([place])    History reviewed. No pertinent surgical history.  Family History: History reviewed. No pertinent family history.   Family Psychiatric  History: Unknown  Social History:  History  Alcohol Use No     History  Drug Use No    Social History   Social History  . Marital status: Single    Spouse name: N/A  . Number of children: N/A  . Years of education: N/A   Social History Main Topics  . Smoking status: Current Every Day Smoker    Packs/day: 1.00    Years: 15.00  . Smokeless tobacco: Never Used  . Alcohol use No  . Drug use: No  . Sexual activity: No   Other Topics Concern  . None   Social History Narrative  . None     Current Medications: Current Facility-Administered Medications  Medication Dose Route Frequency Provider Last Rate Last Dose  . acetaminophen (TYLENOL) tablet 650 mg  650 mg Oral Q6H  PRN [name], [name], MD      . alum & mag hydroxide-simeth (MAALOX/MYLANTA) 200-200-20 MG/5ML suspension 30 mL  30 mL Oral Q4H PRN [name], [name], MD      . Derrill Memo ON 01/19/2017] ARIPiprazole (ABILIFY) tablet 15 mg  15 mg Oral Daily Hildred Priest, MD      . divalproex (DEPAKOTE) DR tablet 500 mg  500 mg Oral Q12H Hildred Priest, MD   500 mg at 01/18/17 1214  . fluPHENAZine (PROLIXIN) tablet 2.5 mg  2.5 mg Oral Q8H Hildred Priest, MD   2.5 mg at 01/18/17 1216  . [START ON 01/25/2017] fluPHENAZine decanoate (PROLIXIN) injection 50 mg  50 mg Intramuscular Q14 Days Hildred Priest, MD      . magnesium hydroxide (MILK OF MAGNESIA) suspension 30 mL  30 mL Oral Daily PRN [name], [name], MD   30 mL at 01/17/17 1949  . nicotine (NICODERM CQ - dosed in mg/24 hours) patch 21 mg  21 mg Transdermal Daily Hildred Priest, MD      . zolpidem (AMBIEN) tablet 10 mg  10 mg Oral QHS Hildred Priest, MD   10 mg at 01/17/17  2147    Lab Results:  Results for orders placed or performed during the hospital encounter of 01/11/17 (from the past 48 hour(s))  Valproic acid level     Status: None   Collection Time: 01/18/17  6:38 AM  Result Value Ref Range   Valproic Acid Lvl 76 50.0 - 100.0 ug/mL    Blood Alcohol level:  Lab Results  Component Value Date   ETH <5 01/11/2017   ETH <5 94/80/1655    Metabolic Disorder Labs: Lab Results  Component Value Date   HGBA1C 5.3 01/12/2017   MPG 105.41 01/12/2017   MPG 114 12/13/2016   No results found for: PROLACTIN Lab Results  Component Value Date   CHOL 149 01/12/2017   TRIG 48 01/12/2017   HDL 51 01/12/2017   CHOLHDL 2.9 01/12/2017   VLDL 10 01/12/2017   LDLCALC 88 01/12/2017   LDLCALC 65 12/13/2016    Physical Findings: AIMS: Facial and Oral Movements Muscles of Facial Expression: None, normal Lips and Perioral Area: None, normal Jaw: None, normal Tongue: None, normal,Extremity  Movements Upper (arms, wrists, hands, fingers): None, normal Lower (legs, knees, ankles, toes): None, normal, Trunk Movements Neck, shoulders, hips: None, normal, Overall Severity Severity of abnormal movements (highest score from questions above): None, normal Incapacitation due to abnormal movements: None, normal Patient's awareness of abnormal movements (rate only patient's report): No Awareness, Dental Status Current problems with teeth and/or dentures?: No Does patient usually wear dentures?: Yes  CIWA:    COWS:  COWS Total Score: 0  Musculoskeletal: Strength & Muscle Tone: within normal limits Gait & Station: normal Patient leans: N/A  Psychiatric Specialty Exam: Physical Exam  Constitutional: He is oriented to person, place, and time. He appears well-developed and well-nourished.  HENT:  Head: Normocephalic and atraumatic.  Eyes: EOM are normal.  Neck: Normal range of motion.  Respiratory: Effort normal.  Musculoskeletal: Normal range of motion.  Neurological: He is alert and oriented to person, place, and time.  Psychiatric: He expresses no homicidal and no suicidal ideation. He expresses no suicidal plans and no homicidal plans.    Review of Systems  Constitutional: Negative.   HENT: Negative.   Eyes: Negative.   Respiratory: Negative.   Cardiovascular: Negative.   Gastrointestinal: Negative.   Genitourinary: Negative.   Musculoskeletal: Negative.   Skin: Negative.   Neurological: Negative.   Endo/Heme/Allergies: Negative.   Psychiatric/Behavioral: Negative for depression, hallucinations, memory loss, substance abuse and suicidal ideas. The patient is not nervous/anxious and does not have insomnia.     Blood pressure 105/82, pulse 72, temperature 98.2 F (36.8 C), resp. rate 18, height 5\' 9"  (1.753 m), weight 72.3 kg (159 lb 6.4 oz), SpO2 100 %.Body mass index is 23.54 kg/m.  General Appearance: Casual  Eye Contact:  Fair  Speech:  Garbled  Volume:  Normal   Mood:  Euthymic  Affect:  Appropriate and Congruent  Thought Process:  Linear and Descriptions of Associations: Loose  Orientation:  Full (Time, Place, and Person)  Thought Content:  Paranoid Ideation  Suicidal Thoughts:  No  Homicidal Thoughts:  No  Memory:  Immediate;   Poor Recent;   Poor Remote;   Poor  Judgement:  Impaired  Insight:  Lacking  Psychomotor Activity:  Increased  Concentration:  Concentration: Fair and Attention Span: Fair  Recall:  Poor  Fund of Knowledge:  Poor  Language:  Fair  Akathisia:  No  Handed:  Right  AIMS (if indicated):     Assets:  Communication Skills  ADL's:  Intact  Cognition:  Impaired,  Mild  Sleep:  Number of Hours: 6     Treatment Plan Summary: Daily contact with patient to assess and evaluate symptoms and progress in treatment and Medication management   A 61 year old male with schizophrenia. Apparently the patient has encountered greatest difficulties in order to receive his long acting injectable medication. Per [facility] collateral information the patient has not been receiving the injection at [facility] because they didn't have a nurse. Apparently [facility] is unable to see him due to his insurance. D/c on 7/11 on prolixin dec and likely has not received the inj since discharge  Schizoaffective: received prolixin dec 50 mg on 8/9 while in the ER.  Pt was refusing oral medications upon admission. Ordered  non emergency forced medications on 8/10.   He has been taking orally since Friday---Improving  Will decrease dose of prolixin to 2.5 mg tid instead of 5 mg bid due to orthostatic hypotension and tachycardia. Due to refractory psychosis will add a second antipsychotic unlikely to cause issues with BP. Will start abilify 10 mg po q am.    Mood sx: Continue Depakote 500 mg po BID---will need a level prior to discharge  Tobacco use d/o: Nicotine patch 21 mg  Agitation: not agitated today.  No major incidents since Friday  Insomnia:  Continue ambien 10 mg po qhs---slept well last night  Vital signs TID--tachycardic will check for orthostatics.  Did have orthostatic + this am  Diet regular, encourage fluids  Precautions: Fall precautions, every 15 minute checks  Hospitalization status: involuntary commitment  Collateral information is needed from patient's family. Laury Axon (Education officer, [employer]) has been in touch with patient's brother, who is amenable to patient returning home upon discharge.  Pt is in need of ACT---will need [employer] founding as he only has medicare.  SW tells me that [employer] will not pay for services if pt has medicaid  Possible d/c early next week. Remains symptomatic  EKG 8/15: NSR, no QT prolongation, HR 61 bpm  Valproic acid (76) on 01/18/17  SW advised the brother to pursue guardianship   Eli Hose, Talitha Givens 01/18/2017, 12:36 PM

## 2017-01-18 NOTE — Progress Notes (Signed)
Recreation Therapy Notes  Date: 08.16.18 Time: 9:30 am Location: Craft Room  Group Topic: Leisure Education  Goal Area(s) Addresses:  Patient will identify things they are grateful for. Patient will identify how being grateful can influence decision making.  Behavioral Response: Did not attend  Intervention: Grateful Wheel  Activity: Patients were given an I Am Grateful For worksheet and were instructed to write things they are grateful for under each category.  Education: LRT educated patients on leisure.  Education Outcome: Patient did not attend group.  Clinical Observations/Feedback: Patient did not attend group.  Leonette Monarch, LRT/CTRS 01/18/2017 10:01 AM

## 2017-01-18 NOTE — [facility] Group Notes (Signed)
Reading Group Notes:  (Nursing/MHT/Case Management/Adjunct)  Date:  01/18/2017  Time:  4:08 PM  Type of Therapy:  Psychoeducational Skills  Participation Level:  Did Not Attend  Dustin [name] 01/18/2017, 4:08 PM

## 2017-01-19 MED ORDER — FLUPHENAZINE HCL 5 MG PO TABS
2.5000 mg | ORAL_TABLET | Freq: Two times a day (BID) | ORAL | Status: DC
  Administered 2017-01-19 – 2017-01-23 (×8): 2.5 mg via ORAL
  Filled 2017-01-19 (×8): qty 1

## 2017-01-19 MED ORDER — CLONAZEPAM 1 MG PO TABS
2.0000 mg | ORAL_TABLET | Freq: Every day | ORAL | Status: DC
  Administered 2017-01-19 – 2017-01-21 (×3): 2 mg via ORAL
  Filled 2017-01-19 (×3): qty 2

## 2017-01-19 NOTE — Progress Notes (Signed)
Patient ID: Dustin [name], male   DOB: 1955/07/15, 61 y.o.   MRN: [id number] Patient is getting frustrated, encouraged before he took his medications, becoming mean, denied pain, demanding to read the IVC paper, "I have been wrongly committed, bring me that paper, and if there are big words that I don't understand you can help me interpret them .Marland KitchenMarland KitchenMarland Kitchen"

## 2017-01-19 NOTE — [facility] Group Notes (Addendum)
[place] Group Notes:  (Nursing/MHT/Case Management/Adjunct)  Date:  01/19/2017  Time:  5:19 AM  Type of Therapy:  Psychoeducational Skills  Summary of Progress/Problems: Patient did not attend group   Reece Agar 01/19/2017, 5:19 AM

## 2017-01-19 NOTE — Plan of Care (Signed)
Problem: Safety: Goal: Ability to remain free from injury will improve Outcome: Progressing Patient remains safe and remain free from injury during hospitalization and on Q 15 minute observation. Will continue to monitor.

## 2017-01-19 NOTE — Progress Notes (Signed)
Patient was isolative to room most of shift, but did come out for meals. Patient was compliant with medications, but still suspicious/paranoid about being over medicated. Patient denies any SI/HI/AH/VH, but appears to be responding to internal stimuli. Patient is alert and oriented x 4, breathing unlabored, and extremities x 4 within normal limits. Patient is calm and cooperative. Patient did not display any disruptive behavior.Patient continues to be monitored on 15 minute safety checks. Will continue to monitor patient and notify MD of any changes.

## 2017-01-19 NOTE — Plan of Care (Signed)
Problem: Activity: Goal: Sleeping patterns will improve Outcome: Progressing Patient slept for Estimated Hours of 8; Precautionary checks every 15 minutes for safety maintained, room free of safety hazards, patient sustains no injury or falls during this shift.    

## 2017-01-19 NOTE — Progress Notes (Signed)
Chaplain met with pt for spiritual group. Pt talked interpersonal relationship with his brothers, especially one that he lives with who is deaf and dumb that he thinks is a source of his troubles. Pt also complained of drug overdose, was concerned about his sleeping patterns, and was opened to seek help in areas he is struggling with. Pt talked about positive measures he would like to pursue and will working on his goals to guide him post hospital, he said.    01/19/17 1600  Clinical Encounter Type  Visited With Patient  Visit Type Initial;Psychological support;Spiritual support  Referral From Nurse  Consult/Referral To Chaplain  Spiritual Encounters  Spiritual Needs Other (Comment)

## 2017-01-19 NOTE — [facility] Group Notes (Signed)
[place] LCSW Group Therapy Note  Date/Time: 01/19/17, 1300  Type of Therapy and Topic:  Group Therapy:  Feelings around Relapse and Recovery  Participation Level:  Did Not Attend   Mood:  Description of Group:    Patients in this group will discuss emotions they experience before and after a relapse. They will process how experiencing these feelings, or avoidance of experiencing them, relates to having a relapse. Facilitator will guide patients to explore emotions they have related to recovery. Patients will be encouraged to process which emotions are more powerful. They will be guided to discuss the emotional reaction significant others in their lives may have to patients' relapse or recovery. Patients will be assisted in exploring ways to respond to the emotions of others without this contributing to a relapse.  Therapeutic Goals: 1. Patient will identify two or more emotions that lead to relapse for them:  2. Patient will identify two emotions that result when they relapse:  3. Patient will identify two emotions related to recovery:  4. Patient will demonstrate ability to communicate their needs through discussion and/or role plays.   Summary of Patient Progress:     Therapeutic Modalities:   Cognitive Behavioral Therapy Solution-Focused Therapy Assertiveness Training Relapse Prevention Therapy  Lurline Idol, LCSW

## 2017-01-19 NOTE — Progress Notes (Signed)
Recreation Therapy Notes  Date: 08.17.18 Time: 9:30 am Location: Craft Room  Group Topic: Coping Skills  Goal Area(s) Addresses:  Patient will verbalize benefit of using art as a coping skill. Patient will verbalize one emotion experienced in group.  Behavioral Response: Did not attend  Intervention: Coloring  Activity: Patients were give coloring sheets to color and were instructed to think about the emotions they were feeling as well as what their minds were focused on.  Education: LRT educated patients on healthy coping skills.  Education Outcome: Patient did not attend group.  Clinical Observations/Feedback: Patient did not attend group.  Leonette Monarch, LRT/CTRS 01/19/2017 10:16 AM

## 2017-01-19 NOTE — Progress Notes (Signed)
[facility] MD Progress Note  01/19/2017 9:47 AM Dustin [name]  MRN:  [id number] Subjective:  Dustin [name] an 61 y.o.male with Schizophrenia who presented to ED under IVC on 8/9. Pt was agitated, loud, uncooperative, paranoid and verbally aggressive. Per petition "trying to insight police to kill him".  According to patient he was at home when police came and entered his home. He does not know why they came. He makes repetitive religious references and reports that his aunt is working with a devil Dr. He endorses recent auditory hallucinations but denies any currently. Denies SI HI or visual hallucinations. Denies taking any antipsychotic or mood stabilizing medications.  Discharge from our unit on 7/11.  At that time he was disorganized and delusional. Place on forced meds.  Finally stabilized on prolixin po and decanoate. He was discharged back to his brother's house.    I imagine he did not f/u or take any meds after discharge.    Per nursing staff: "Patient appears irritable and angry using profanities, affect is blunted, patient was not calm during admission process, he kept using vulgar words and sexual explicit terms and was often told to be mindful of such words. Patient denies SI/AVH but noted responding to internal stimuli. Patient's thoughts are disorganized, speech is pressured and tangential."   Trauma: unknown  Substance abuse denies. Urine toxicology negative. Alcohol below the detection limit. Patient is a heavy smoker.  He is currently on forced medication.  8/11 Patient seen in his room this morning his sitting in his bed with a bright smile. States he is doing quite well. He then makes some inappropriate comments to this physician. He starts winking.   8/12 Patient was seen in his room this morning. He has a sheet over his head and states that he wants to rest and would not talk. Per staff patient continues to be disorganized and irritable. He has been observed to be having  that she told his head since last evening per staff notes. He has insisted with staff that he wants to be left alone. He has not been aggressive on the unit. He has not been attending any groups.  8/13 Patient seen in room wearing scrubs and washing feet in bed with washcloth. He continues to state that he does not want to be here and he is not happy that he has been forced to take medications. He is concerned about receiving injection medication because "he needs a shot like he needs a shot in the head, with a bullet". Patient desires physician to call his brother and gives a phone number. Patient reports that he was given a Bible and he is pleased and extends his thanks. Denies SI/HI/Hallucinations.   8/14 the patient was less irritable today. He actually answer questions let this writer speak. He said he slept well. He denies problems with current medications, denies side effects or physical complaints. Says his appetite is little. Denies any hallucinations or suicidal ideation. Still very upset about the police showing out at his place. He reluctantly taking his medications.  8/15 Pt has been compliant with oral meds but is still very reluctant about taking them.  Denies SI/HI or hallucinations. Denies SE from medications other than mild constipation.  Per staff he is eating and sleeping well.  During assessment today he was hyper religious, talking about the magic in the bible, vooddo and courses.  Thought process was disorganized and hard to follow  8/16 patient says that medications dry his brain.  He said he is taking too many medications. He continues to believe that he does not need medications. It is very unlikely that he will continue any medications after discharge. He does not see why he is advised to take them. He says he does not have schizophrenia.  Says that he was kidnapped by police. He says being here is against his will. 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  8/17 argumentative, irritable, says he didn't sleep at all last night. He says he doesn't need to be here in the hospital denies having schizophrenia. He says that this is all [place] trying to using as a [place] pig.  Per Nursing Patient is getting frustrated, encouraged before he took his medications, becoming mean, denied pain, demanding to read the IVC paper, "I have been wrongly committed, bring me that paper, and if there are big words that I don't understand you can help me interpret them .[name].."  Principal Problem: Schizophrenia ([place]) Diagnosis:   Patient Active Problem List   Diagnosis Date Noted  . Schizophrenia ([place]) [F20.9] 01/11/2017  . Tobacco use disorder [F17.200] 11/29/2016   Total Time spent with patient: 30 minutes  Past Psychiatric History: Patient was seeing a psychiatrist at [facility] He has been treated in the past with Prolixin injectable   Past Medical History:  Past Medical History:  Diagnosis Date  . Schizophrenia ([place])    History reviewed. No pertinent surgical history.  Family History: History reviewed. No pertinent family history.   Family Psychiatric  History: Unknown  Social History:  History  Alcohol Use No     History  Drug Use No    Social History   Social History  . Marital status: Single    Spouse name: N/A  . Number of children: N/A  . Years of education: N/A   Social History Main Topics  . Smoking status: Current Every Day Smoker    Packs/day: 1.00    Years: 15.00  . Smokeless tobacco: Never Used  . Alcohol use No  . Drug use: No  . Sexual activity: No   Other Topics Concern  . None   Social History Narrative  . None     Current Medications: Current Facility-Administered Medications  Medication Dose Route Frequency Provider Last Rate Last Dose  . acetaminophen (TYLENOL) tablet 650 mg  650 mg Oral Q6H PRN [name], [name], MD      . alum & mag hydroxide-simeth  (MAALOX/MYLANTA) 200-200-20 MG/5ML suspension 30 mL  30 mL Oral Q4H PRN [name], [name], MD      . ARIPiprazole (ABILIFY) tablet 15 mg  15 mg Oral Daily Hildred Priest, MD   15 mg at 01/19/17 6578  . divalproex (DEPAKOTE) DR tablet 500 mg  500 mg Oral Q12H Hildred Priest, MD   500 mg at 01/19/17 4696  . fluPHENAZine (PROLIXIN) tablet 2.5 mg  2.5 mg Oral BID Hildred Priest, MD      . Derrill Memo ON 01/25/2017] fluPHENAZine decanoate (PROLIXIN) injection 50 mg  50 mg Intramuscular Q14 Days Hildred Priest, MD      . magnesium hydroxide (MILK OF MAGNESIA) suspension 30 mL  30 mL Oral Daily PRN [name], [name], MD   30 mL at 01/17/17 1949  . nicotine (NICODERM CQ - dosed in mg/24 hours) patch 21 mg  21 mg Transdermal Daily Hildred Priest, MD      . zolpidem (AMBIEN) tablet 10  mg  10 mg Oral QHS Hildred Priest, MD   10 mg at 01/18/17 2130    Lab Results:  Results for orders placed or performed during the hospital encounter of 01/11/17 (from the past 48 hour(s))  Valproic acid level     Status: None   Collection Time: 01/18/17  6:38 AM  Result Value Ref Range   Valproic Acid Lvl 76 50.0 - 100.0 ug/mL    Blood Alcohol level:  Lab Results  Component Value Date   ETH <5 01/11/2017   ETH <5 54/65/6812    Metabolic Disorder Labs: Lab Results  Component Value Date   HGBA1C 5.3 01/12/2017   MPG 105.41 01/12/2017   MPG 114 12/13/2016   No results found for: PROLACTIN Lab Results  Component Value Date   CHOL 149 01/12/2017   TRIG 48 01/12/2017   HDL 51 01/12/2017   CHOLHDL 2.9 01/12/2017   VLDL 10 01/12/2017   LDLCALC 88 01/12/2017   LDLCALC 65 12/13/2016    Physical Findings: AIMS: Facial and Oral Movements Muscles of Facial Expression: None, normal Lips and Perioral Area: None, normal Jaw: None, normal Tongue: None, normal,Extremity Movements Upper (arms, wrists, hands, fingers): None, normal Lower (legs, knees,  ankles, toes): None, normal, Trunk Movements Neck, shoulders, hips: None, normal, Overall Severity Severity of abnormal movements (highest score from questions above): None, normal Incapacitation due to abnormal movements: None, normal Patient's awareness of abnormal movements (rate only patient's report): No Awareness, Dental Status Current problems with teeth and/or dentures?: No Does patient usually wear dentures?: Yes  CIWA:    COWS:  COWS Total Score: 0  Musculoskeletal: Strength & Muscle Tone: within normal limits Gait & Station: normal Patient leans: N/A  Psychiatric Specialty Exam: Physical Exam  Constitutional: He is oriented to person, place, and time. He appears well-developed and well-nourished.  HENT:  Head: Normocephalic and atraumatic.  Eyes: EOM are normal.  Neck: Normal range of motion.  Respiratory: Effort normal.  Musculoskeletal: Normal range of motion.  Neurological: He is alert and oriented to person, place, and time.  Psychiatric: He expresses no homicidal and no suicidal ideation. He expresses no suicidal plans and no homicidal plans.    Review of Systems  Constitutional: Negative.   HENT: Negative.   Eyes: Negative.   Respiratory: Negative.   Cardiovascular: Negative.   Gastrointestinal: Negative.   Genitourinary: Negative.   Musculoskeletal: Negative.   Skin: Negative.   Neurological: Negative.   Endo/Heme/Allergies: Negative.   Psychiatric/Behavioral: Negative for depression, hallucinations, memory loss, substance abuse and suicidal ideas. The patient is not nervous/anxious and does not have insomnia.     Blood pressure 101/83, pulse 96, temperature 97.9 F (36.6 C), temperature source Oral, resp. rate 18, height 5\' 9"  (1.753 m), weight 72.3 kg (159 lb 6.4 oz), SpO2 100 %.Body mass index is 23.54 kg/m.  General Appearance: Casual  Eye Contact:  Fair  Speech:  Garbled  Volume:  Normal  Mood:  Euthymic  Affect:  Appropriate and Congruent   Thought Process:  Linear and Descriptions of Associations: Loose  Orientation:  Full (Time, Place, and Person)  Thought Content:  Paranoid Ideation  Suicidal Thoughts:  No  Homicidal Thoughts:  No  Memory:  Immediate;   Poor Recent;   Poor Remote;   Poor  Judgement:  Impaired  Insight:  Lacking  Psychomotor Activity:  Increased  Concentration:  Concentration: Fair and Attention Span: Fair  Recall:  Poor  Fund of Knowledge:  Poor  Language:  Fair  Akathisia:  No  Handed:  Right  AIMS (if indicated):     Assets:  Communication Skills  ADL's:  Intact  Cognition:  Impaired,  Mild  Sleep:  Number of Hours: 8     Treatment Plan Summary: Daily contact with patient to assess and evaluate symptoms and progress in treatment and Medication management   A 61 year old male with schizophrenia. Apparently the patient has encountered greatest difficulties in order to receive his long acting injectable medication. Per [facility] collateral information the patient has not been receiving the injection at [facility] because they didn't have a nurse. Apparently [facility] is unable to see him due to his insurance. D/c on 7/11 on prolixin dec and likely has not received the inj since discharge  Schizoaffective: received prolixin dec 50 mg on 8/9 while in the ER. Next dose on 8/23  Pt was refusing oral medications upon admission. Ordered  non emergency forced medications on 8/10.   He has been taking orally since Friday---Improving  Prolixin has been decreased to 2.5 mg tid instead of 5 mg bid due to orthostatic hypotension and tachycardia. Due to refractory psychosis  a second antipsychotic was added.  Now on abilify  Today, due to orthostatic hypotension I will decrease prolixin to 2.5 mg po bid.  Continue Abilify 15 mg po q day   Mood sx: Continue Depakote 500 mg po BID---level on 8/16 was 76  Tobacco use d/o: Ordered nicotine patch 21 mg  Agitation: not agitated today.  No major incidents since  Friday  Insomnia: Has is started not to sleep well again. I will order Klonopin 2 mg daily at bedtime  Vital signs tid--tachycardic will check for orthostatics.  Did have orthostatic yesterday, no today  Diet regular, encourage fluids  Precautions: Fall precautions, every 15 minute checks  Hospitalization status: involuntary commitment  Collateral information is needed from patient's family.  Pt is in need of ACT---will need [employer] founding as he only has medicare.  SW tells me that [employer] will not pay for services if pt has medicaid  Possible d/c 7 days. Still very symptomatic  EKG wnl  Depakote level on 8/16 was 34  SW advised the brother to pursue guardianship  Potential d/c next week after second dose of prolixin which is due on 8/23   Hildred Priest, MD 01/19/2017, 9:47 AM

## 2017-01-20 DIAGNOSIS — F201 Disorganized schizophrenia: Secondary | ICD-10-CM

## 2017-01-20 NOTE — [facility] Group Notes (Signed)
[facility] LCSW Group Therapy  01/20/2017 3:33 PM  Type of Therapy:  Group Therapy  Participation Level:  None  Participation Quality:  Inattentive  Affect:  Appropriate  Cognitive:  Alert and Lacking  Insight:  None  Engagement in Therapy:  None  Modes of Intervention:  Activity, Clarification, Discussion, Education, Exploration, Limit-setting, Problem-solving, Reality Testing, Socialization and Support  Summary of Progress/Problems: Communications: Patients identify how individuals communicate with one another appropriately and inappropriately. Patients will be guided to discuss their thoughts, feelings, and behaviors related to barriers when communicating. The group will process together ways to execute positive and appropriate communications. Patient arrived to group late. CSW attempted to engage patient in group discussion. Patient did not want to engage responding with "Pass". Patient stayed in group for about 10 minutes and left shortly after. Patient did not return.   Deshaun Weisinger G. Dubach, [place] 01/20/2017, 3:39 PM

## 2017-01-20 NOTE — [facility] Group Notes (Signed)
[facility] Group Notes:  (Nursing/MHT/Case Management/Adjunct)  Date:  01/20/2017  Time:  9:49 PM  Type of Therapy:  Evening Wrap-up Group  Participation Level:  Minimal  Participation Quality:  Attentive  Affect:  Tearful  Cognitive:  Disorganized  Insight:  Limited  Engagement in Group:  Lacking  Modes of Intervention:  Discussion  Summary of Progress/Problems:  Dustin [name] 01/20/2017, 9:49 PM

## 2017-01-20 NOTE — Progress Notes (Signed)
Isolative to room.  Denies SI/HI/AVH.  Speech incoherent at times.  Support offered.  Safety maintained.

## 2017-01-20 NOTE — Plan of Care (Signed)
Problem: Coping: Goal: Ability to cope will improve Outcome: Progressing Patient was observed dressed in street clothes and walking in and out of the dayroom.  He attended group this evening.  He appeared irritable when approached in his room but identified no concerns.    Problem: Safety: Goal: Ability to disclose and discuss suicidal ideas will improve Outcome: Progressing Patient contracts for safety.  He took HS medications without event.  Safety is maintained.

## 2017-01-20 NOTE — Progress Notes (Signed)
[facility] MD Progress Note  01/20/2017 2:40 PM Dustin [name]  MRN:  [id number] Subjective:  Dustin [name] an 61 y.o.male with Schizophrenia who presented to ED under IVC on 8/9. Pt was agitated, loud, uncooperative, paranoid and verbally aggressive. Per petition "trying to insight police to kill him".  According to patient he was at home when police came and entered his home. He does not know why they came. He makes repetitive religious references and reports that his aunt is working with a devil Dr. He endorses recent auditory hallucinations but denies any currently. Denies SI HI or visual hallucinations. Denies taking any antipsychotic or mood stabilizing medications.  Discharge from our unit on 7/11.  At that time he was disorganized and delusional. Place on forced meds.  Finally stabilized on prolixin po and decanoate. He was discharged back to his brother's house.    I imagine he did not f/u or take any meds after discharge.    Per nursing staff: "Patient appears irritable and angry using profanities, affect is blunted, patient was not calm during admission process, he kept using vulgar words and sexual explicit terms and was often told to be mindful of such words. Patient denies SI/AVH but noted responding to internal stimuli. Patient's thoughts are disorganized, speech is pressured and tangential."   Trauma: unknown  Substance abuse denies. Urine toxicology negative. Alcohol below the detection limit. Patient is a heavy smoker.  He is currently on forced medication.  8/11 Patient seen in his room this morning his sitting in his bed with a bright smile. States he is doing quite well. He then makes some inappropriate comments to this physician. He starts winking.   8/12 Patient was seen in his room this morning. He has a sheet over his head and states that he wants to rest and would not talk. Per staff patient continues to be disorganized and irritable. He has been observed to be having  that she told his head since last evening per staff notes. He has insisted with staff that he wants to be left alone. He has not been aggressive on the unit. He has not been attending any groups.  8/13 Patient seen in room wearing scrubs and washing feet in bed with washcloth. He continues to state that he does not want to be here and he is not happy that he has been forced to take medications. He is concerned about receiving injection medication because "he needs a shot like he needs a shot in the head, with a bullet". Patient desires physician to call his brother and gives a phone number. Patient reports that he was given a Bible and he is pleased and extends his thanks. Denies SI/HI/Hallucinations.   8/14 the patient was less irritable today. He actually answer questions let this writer speak. He said he slept well. He denies problems with current medications, denies side effects or physical complaints. Says his appetite is little. Denies any hallucinations or suicidal ideation. Still very upset about the police showing out at his place. He reluctantly taking his medications.  8/15 Pt has been compliant with oral meds but is still very reluctant about taking them.  Denies SI/HI or hallucinations. Denies SE from medications other than mild constipation.  Per staff he is eating and sleeping well.  During assessment today he was hyper religious, talking about the magic in the bible, vooddo and courses.  Thought process was disorganized and hard to follow  8/16 patient says that medications dry his brain.  He said he is taking too many medications. He continues to believe that he does not need medications. It is very unlikely that he will continue any medications after discharge. He does not see why he is advised to take them. He says he does not have schizophrenia.  Says that he was kidnapped by police. He says being here is against his will. 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  8/17 argumentative, irritable, says he didn't sleep at all last night. He says he doesn't need to be here in the hospital denies having schizophrenia. He says that this is all [place] trying to using as a [place] pig.  Follow-up for Saturday the 18th. Compared to previous notes he seems to be doing much better today. I found him actually polite and insightful. He says he is looking forward to going home with the assistance of his case manager. Denies that he's having any hallucinations. Denies suicidal or homicidal thoughts. Says he actually appreciates the medicine. He is feeling tired during the day but not excessively so and has no medical complaints.  Per Nursing Patient is getting frustrated, encouraged before he took his medications, becoming mean, denied pain, demanding to read the IVC paper, "I have been wrongly committed, bring me that paper, and if there are big words that I don't understand you can help me interpret them .[name].."  Principal Problem: Schizophrenia ([place]) Diagnosis:   Patient Active Problem List   Diagnosis Date Noted  . Schizophrenia ([place]) [F20.9] 01/11/2017  . Tobacco use disorder [F17.200] 11/29/2016   Total Time spent with patient: 30 minutes  Past Psychiatric History: Patient was seeing a psychiatrist at [facility] He has been treated in the past with Prolixin injectable   Past Medical History:  Past Medical History:  Diagnosis Date  . Schizophrenia ([place])    History reviewed. No pertinent surgical history.  Family History: History reviewed. No pertinent family history.   Family Psychiatric  History: Unknown  Social History:  History  Alcohol Use No     History  Drug Use No    Social History   Social History  . Marital status: Single    Spouse name: N/A  . Number of children: N/A  . Years of education: N/A   Social History Main Topics  . Smoking status: Current Every Day Smoker    Packs/day:  1.00    Years: 15.00  . Smokeless tobacco: Never Used  . Alcohol use No  . Drug use: No  . Sexual activity: No   Other Topics Concern  . None   Social History Narrative  . None     Current Medications: Current Facility-Administered Medications  Medication Dose Route Frequency Provider Last Rate Last Dose  . acetaminophen (TYLENOL) tablet 650 mg  650 mg Oral Q6H PRN Safiyyah Vasconez T, MD      . alum & mag hydroxide-simeth (MAALOX/MYLANTA) 200-200-20 MG/5ML suspension 30 mL  30 mL Oral Q4H PRN Kimbree Casanas T, MD      . ARIPiprazole (ABILIFY) tablet 15 mg  15 mg Oral Daily Hildred Priest, MD   15 mg at 01/20/17 0910  . clonazePAM (KLONOPIN) tablet 2 mg  2 mg Oral QHS Hildred Priest, MD   2 mg at 01/19/17 2109  . divalproex (DEPAKOTE) DR tablet 500 mg  500 mg Oral Q12H Hildred Priest, MD   500 mg at 01/20/17 0910  . fluPHENAZine (PROLIXIN) tablet  2.5 mg  2.5 mg Oral BID Hildred Priest, MD   2.5 mg at 01/20/17 0910  . [START ON 01/25/2017] fluPHENAZine decanoate (PROLIXIN) injection 50 mg  50 mg Intramuscular Q14 Days Hildred Priest, MD      . magnesium hydroxide (MILK OF MAGNESIA) suspension 30 mL  30 mL Oral Daily PRN Karsen Fellows, [name], MD   30 mL at 01/17/17 1949  . nicotine (NICODERM CQ - dosed in mg/24 hours) patch 21 mg  21 mg Transdermal Daily Hildred Priest, MD        Lab Results:  No results found for this or any previous visit (from the past 48 hour(s)).  Blood Alcohol level:  Lab Results  Component Value Date   ETH <5 01/11/2017   ETH <5 53/61/4431    Metabolic Disorder Labs: Lab Results  Component Value Date   HGBA1C 5.3 01/12/2017   MPG 105.41 01/12/2017   MPG 114 12/13/2016   No results found for: PROLACTIN Lab Results  Component Value Date   CHOL 149 01/12/2017   TRIG 48 01/12/2017   HDL 51 01/12/2017   CHOLHDL 2.9 01/12/2017   VLDL 10 01/12/2017   LDLCALC 88 01/12/2017   LDLCALC 65  12/13/2016    Physical Findings: AIMS: Facial and Oral Movements Muscles of Facial Expression: None, normal Lips and Perioral Area: None, normal Jaw: None, normal Tongue: None, normal,Extremity Movements Upper (arms, wrists, hands, fingers): None, normal Lower (legs, knees, ankles, toes): None, normal, Trunk Movements Neck, shoulders, hips: None, normal, Overall Severity Severity of abnormal movements (highest score from questions above): None, normal Incapacitation due to abnormal movements: None, normal Patient's awareness of abnormal movements (rate only patient's report): No Awareness, Dental Status Current problems with teeth and/or dentures?: No Does patient usually wear dentures?: Yes  CIWA:    COWS:  COWS Total Score: 0  Musculoskeletal: Strength & Muscle Tone: within normal limits Gait & Station: normal Patient leans: N/A  Psychiatric Specialty Exam: Physical Exam  Nursing note and vitals reviewed. Constitutional: He is oriented to person, place, and time. He appears well-developed and well-nourished.  HENT:  Head: Normocephalic and atraumatic.  Eyes: EOM are normal.  Neck: Normal range of motion.  Cardiovascular: Regular rhythm.   Respiratory: Effort normal. No respiratory distress.  Musculoskeletal: Normal range of motion.  Neurological: He is alert and oriented to person, place, and time.  Psychiatric: His speech is normal. Judgment normal. His affect is blunt. He is withdrawn. Cognition and memory are impaired. He expresses no homicidal and no suicidal ideation. He expresses no suicidal plans and no homicidal plans.    Review of Systems  Constitutional: Negative.   HENT: Negative.   Eyes: Negative.   Respiratory: Negative.   Cardiovascular: Negative.   Gastrointestinal: Negative.   Genitourinary: Negative.   Musculoskeletal: Negative.   Skin: Negative.   Neurological: Negative.   Endo/Heme/Allergies: Negative.   Psychiatric/Behavioral: Negative for  depression, hallucinations, memory loss, substance abuse and suicidal ideas. The patient is not nervous/anxious and does not have insomnia.     Blood pressure 108/71, pulse 83, temperature 97.8 F (36.6 C), resp. rate 18, height 5\' 9"  (1.753 m), weight 159 lb 6.4 oz (72.3 kg), SpO2 100 %.Body mass index is 23.54 kg/m.  General Appearance: Casual  Eye Contact:  Fair  Speech:  Garbled  Volume:  Normal  Mood:  Euthymic  Affect:  Appropriate and Congruent  Thought Process:  Linear and Descriptions of Associations: Loose  Orientation:  Full (Time, Place, and Person)  Thought Content:  Paranoid Ideation  Suicidal Thoughts:  No  Homicidal Thoughts:  No  Memory:  Immediate;   Poor Recent;   Poor Remote;   Poor  Judgement:  Impaired  Insight:  Lacking  Psychomotor Activity:  Increased  Concentration:  Concentration: Fair and Attention Span: Fair  Recall:  Poor  Fund of Knowledge:  Poor  Language:  Fair  Akathisia:  No  Handed:  Right  AIMS (if indicated):     Assets:  Communication Skills  ADL's:  Intact  Cognition:  Impaired,  Mild  Sleep:  Number of Hours: 6.3     Treatment Plan Summary: Daily contact with patient to assess and evaluate symptoms and progress in treatment and Medication management   A 61 year old male with schizophrenia. Apparently the patient has encountered greatest difficulties in order to receive his long acting injectable medication. Per [facility] collateral information the patient has not been receiving the injection at [facility] because they didn't have a nurse. Apparently [facility] is unable to see him due to his insurance. D/c on 7/11 on prolixin dec and likely has not received the inj since discharge  Schizoaffective: received prolixin dec 50 mg on 8/9 while in the ER. Next dose on 8/23  Pt was refusing oral medications upon admission. Ordered  non emergency forced medications on 8/10.   He has been taking orally since Friday---Improving  Prolixin has been  decreased to 2.5 mg tid instead of 5 mg bid due to orthostatic hypotension and tachycardia. Due to refractory psychosis  a second antipsychotic was added.  Now on abilify  Today, due to orthostatic hypotension I will decrease prolixin to 2.5 mg po bid.  Continue Abilify 15 mg po q day   Mood sx: Continue Depakote 500 mg po BID---level on 8/16 was 76  Tobacco use d/o: Ordered nicotine patch 21 mg  Agitation: not agitated today.  No major incidents since Friday  Insomnia: Has is started not to sleep well again. I will order Klonopin 2 mg daily at bedtime  Vital signs tid--tachycardic will check for orthostatics.  Did have orthostatic yesterday, no today  Diet regular, encourage fluids  Precautions: Fall precautions, every 15 minute checks  Hospitalization status: involuntary commitment  Collateral information is needed from patient's family.  Pt is in need of ACT---will need [employer] founding as he only has medicare.  SW tells me that [employer] will not pay for services if pt has medicaid  Possible d/c 7 days. Still very symptomatic  EKG wnl  Depakote level on 8/16 was 49  SW advised the brother to pursue guardianship  Potential d/c next week after second dose of prolixin which is due on 8/23  Patient reports that he is feeling better. He is comfortable with the plan of staying in the hospital until he gets Prolixin again. No threatening or agitated or bizarre paranoid behavior today. Medically stable. Vitals normal. No change to medicine supportive counseling completed with patient   Alethia Berthold, MD 01/20/2017, 2:40 PM

## 2017-01-20 NOTE — Plan of Care (Signed)
Problem: Safety: Goal: Ability to redirect hostility and anger into socially appropriate behaviors will improve Outcome: Progressing Patient approached writer to show me his ACT Team staff numbers and stated he needs to contact them about discharge.  He was given information on discussing this with doctor and Education officer, museum.  He was given positive feedback on making gains during this admission.    Problem: Education: Goal: Utilization of techniques to improve thought processes will improve Outcome: Progressing Patient is accepting of the need for medication compliance to help him with thought processes and hostility toward others.

## 2017-01-20 NOTE — Plan of Care (Signed)
Problem: Activity: Goal: Interest or engagement in leisure activities will improve Outcome: Progressing Attending groups, more visible in the milieu.

## 2017-01-21 NOTE — [facility] Group Notes (Signed)
[facility] LCSW Group Therapy  01/21/2017 2:36 PM  Type of Therapy:  Group Therapy  Participation Level:  Patient did not attend group. CSW invited patient to group.   Summary of Progress/Problems:  Dustin [name] G. [place], [place] 01/21/2017, 2:38 PM

## 2017-01-21 NOTE — Progress Notes (Signed)
[name] MD Progress Note  01/21/2017 2:21 PM Dustin [name]  MRN:  [id number] Subjective:  Dustin [name] an 61 y.o.male with Schizophrenia who presented to ED under IVC on 8/9. Pt was agitated, loud, uncooperative, paranoid and verbally aggressive. Per petition "trying to insight police to kill him".  According to patient he was at home when police came and entered his home. He does not know why they came. He makes repetitive religious references and reports that his aunt is working with a devil Dr. He endorses recent auditory hallucinations but denies any currently. Denies SI HI or visual hallucinations. Denies taking any antipsychotic or mood stabilizing medications.  Discharge from our unit on 7/11.  At that time he was disorganized and delusional. Place on forced meds.  Finally stabilized on prolixin po and decanoate. He was discharged back to his brother's house.    I imagine he did not f/u or take any meds after discharge.    Per nursing staff: "Patient appears irritable and angry using profanities, affect is blunted, patient was not calm during admission process, he kept using vulgar words and sexual explicit terms and was often told to be mindful of such words. Patient denies SI/AVH but noted responding to internal stimuli. Patient's thoughts are disorganized, speech is pressured and tangential."   Trauma: unknown  Substance abuse denies. Urine toxicology negative. Alcohol below the detection limit. Patient is a heavy smoker.  He is currently on forced medication.  8/11 Patient seen in his room this morning his sitting in his bed with a bright smile. States he is doing quite well. He then makes some inappropriate comments to this physician. He starts winking.   8/12 Patient was seen in his room this morning. He has a sheet over his head and states that he wants to rest and would not talk. Per staff patient continues to be disorganized and irritable. He has been observed to be having  that she told his head since last evening per staff notes. He has insisted with staff that he wants to be left alone. He has not been aggressive on the unit. He has not been attending any groups.  8/13 Patient seen in room wearing scrubs and washing feet in bed with washcloth. He continues to state that he does not want to be here and he is not happy that he has been forced to take medications. He is concerned about receiving injection medication because "he needs a shot like he needs a shot in the head, with a bullet". Patient desires physician to call his brother and gives a phone number. Patient reports that he was given a Bible and he is pleased and extends his thanks. Denies SI/HI/Hallucinations.   8/14 the patient was less irritable today. He actually answer questions let this writer speak. He said he slept well. He denies problems with current medications, denies side effects or physical complaints. Says his appetite is little. Denies any hallucinations or suicidal ideation. Still very upset about the police showing out at his place. He reluctantly taking his medications.  8/15 Pt has been compliant with oral meds but is still very reluctant about taking them.  Denies SI/HI or hallucinations. Denies SE from medications other than mild constipation.  Per staff he is eating and sleeping well.  During assessment today he was hyper religious, talking about the magic in the bible, vooddo and courses.  Thought process was disorganized and hard to follow  8/16 patient says that medications dry his brain.  He said he is taking too many medications. He continues to believe that he does not need medications. It is very unlikely that he will continue any medications after discharge. He does not see why he is advised to take them. He says he does not have schizophrenia.  Says that he was kidnapped by police. He says being here is against his will. 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  8/17 argumentative, irritable, says he didn't sleep at all last night. He says he doesn't need to be here in the hospital denies having schizophrenia. He says that this is all Dustin [name] trying to using as a Dustin [name].  Follow-up for Saturday the 18th. Compared to previous notes he seems to be doing much better today. I found him actually polite and insightful. He says he is looking forward to going home with the assistance of his case manager. Denies that he's having any hallucinations. Denies suicidal or homicidal thoughts. Says he actually appreciates the medicine. He is feeling tired during the day but not excessively so and has no medical complaints.  Follow-up Sunday the 19th. Patient has no new complaints. He is up out of his bed and able to interact reasonably. He is anxious about going home but is not aggressive or threatening. Reports that he is looking forward to getting discharged and having someone help him at home.  Per Nursing Patient is getting frustrated, encouraged before he took his medications, becoming mean, denied pain, demanding to read the IVC paper, "I have been wrongly committed, bring me that paper, and if there are big words that I don't understand you can help me interpret them .[name].."  Principal Problem: Schizophrenia ([place]) Diagnosis:   Patient Active Problem List   Diagnosis Date Noted  . Schizophrenia ([place]) [F20.9] 01/11/2017  . Tobacco use disorder [F17.200] 11/29/2016   Total Time spent with patient: 30 minutes  Past Psychiatric History: Patient was seeing a psychiatrist at [facility] He has been treated in the past with Prolixin injectable   Past Medical History:  Past Medical History:  Diagnosis Date  . Schizophrenia ([place])    History reviewed. No pertinent surgical history.  Family History: History reviewed. No pertinent family history.   Family Psychiatric  History: Unknown  Social History:   History  Alcohol Use No     History  Drug Use No    Social History   Social History  . Marital status: Single    Spouse name: N/A  . Number of children: N/A  . Years of education: N/A   Social History Main Topics  . Smoking status: Current Every Day Smoker    Packs/day: 1.00    Years: 15.00  . Smokeless tobacco: Never Used  . Alcohol use No  . Drug use: No  . Sexual activity: No   Other Topics Concern  . None   Social History Narrative  . None     Current Medications: Current Facility-Administered Medications  Medication Dose Route Frequency Provider Last Rate Last Dose  . acetaminophen (TYLENOL) tablet 650 mg  650 mg Oral Q6H PRN Helia Haese T, MD      . alum & mag hydroxide-simeth (MAALOX/MYLANTA) 200-200-20 MG/5ML suspension 30 mL  30 mL Oral Q4H PRN Ina Scrivens T, MD      . ARIPiprazole (ABILIFY) tablet 15 mg  15 mg Oral Daily Hildred Priest, MD   15 mg at 01/21/17 0752  .  clonazePAM (KLONOPIN) tablet 2 mg  2 mg Oral QHS Hildred Priest, MD   2 mg at 01/20/17 2116  . divalproex (DEPAKOTE) DR tablet 500 mg  500 mg Oral Q12H Hildred Priest, MD   500 mg at 01/21/17 0752  . fluPHENAZine (PROLIXIN) tablet 2.5 mg  2.5 mg Oral BID Hildred Priest, MD   2.5 mg at 01/21/17 0753  . [START ON 01/25/2017] fluPHENAZine decanoate (PROLIXIN) injection 50 mg  50 mg Intramuscular Q14 Days Hildred Priest, MD      . magnesium hydroxide (MILK OF MAGNESIA) suspension 30 mL  30 mL Oral Daily PRN Shaterra Sanzone, [name], MD   30 mL at 01/17/17 1949  . nicotine (NICODERM CQ - dosed in mg/24 hours) patch 21 mg  21 mg Transdermal Daily Hildred Priest, MD        Lab Results:  No results found for this or any previous visit (from the past 48 hour(s)).  Blood Alcohol level:  Lab Results  Component Value Date   ETH <5 01/11/2017   ETH <5 42/59/5638    Metabolic Disorder Labs: Lab Results  Component Value Date   HGBA1C  5.3 01/12/2017   MPG 105.41 01/12/2017   MPG 114 12/13/2016   No results found for: PROLACTIN Lab Results  Component Value Date   CHOL 149 01/12/2017   TRIG 48 01/12/2017   HDL 51 01/12/2017   CHOLHDL 2.9 01/12/2017   VLDL 10 01/12/2017   LDLCALC 88 01/12/2017   LDLCALC 65 12/13/2016    Physical Findings: AIMS: Facial and Oral Movements Muscles of Facial Expression: None, normal Lips and Perioral Area: None, normal Jaw: None, normal Tongue: None, normal,Extremity Movements Upper (arms, wrists, hands, fingers): None, normal Lower (legs, knees, ankles, toes): None, normal, Trunk Movements Neck, shoulders, hips: None, normal, Overall Severity Severity of abnormal movements (highest score from questions above): None, normal Incapacitation due to abnormal movements: None, normal Patient's awareness of abnormal movements (rate only patient's report): No Awareness, Dental Status Current problems with teeth and/or dentures?: No Does patient usually wear dentures?: Yes  CIWA:    COWS:  COWS Total Score: 0  Musculoskeletal: Strength & Muscle Tone: within normal limits Gait & Station: normal Patient leans: N/A  Psychiatric Specialty Exam: Physical Exam  Nursing note and vitals reviewed. Constitutional: He is oriented to person, place, and time. He appears well-developed and well-nourished.  HENT:  Head: Normocephalic and atraumatic.  Eyes: EOM are normal.  Neck: Normal range of motion.  Cardiovascular: Regular rhythm.   Respiratory: Effort normal. No respiratory distress.  Musculoskeletal: Normal range of motion.  Neurological: He is alert and oriented to person, place, and time.  Psychiatric: His speech is normal. Judgment normal. His affect is blunt. He is withdrawn. Cognition and memory are impaired. He expresses no homicidal and no suicidal ideation. He expresses no suicidal plans and no homicidal plans.    Review of Systems  Constitutional: Negative.   HENT: Negative.    Eyes: Negative.   Respiratory: Negative.   Cardiovascular: Negative.   Gastrointestinal: Negative.   Genitourinary: Negative.   Musculoskeletal: Negative.   Skin: Negative.   Neurological: Negative.   Endo/Heme/Allergies: Negative.   Psychiatric/Behavioral: Negative for depression, hallucinations, memory loss, substance abuse and suicidal ideas. The patient is not nervous/anxious and does not have insomnia.     Blood pressure 104/83, pulse 94, temperature 97.8 F (36.6 C), resp. rate 18, height 5\' 9"  (1.753 m), weight 72.3 kg (159 lb 6.4 oz), SpO2 100 %.Body mass index  is 23.54 kg/m.  General Appearance: Casual  Eye Contact:  Fair  Speech:  Garbled  Volume:  Normal  Mood:  Euthymic  Affect:  Appropriate and Congruent  Thought Process:  Linear and Descriptions of Associations: Loose  Orientation:  Full (Time, Place, and Person)  Thought Content:  Paranoid Ideation  Suicidal Thoughts:  No  Homicidal Thoughts:  No  Memory:  Immediate;   Poor Recent;   Poor Remote;   Poor  Judgement:  Impaired  Insight:  Lacking  Psychomotor Activity:  Increased  Concentration:  Concentration: Fair and Attention Span: Fair  Recall:  Poor  Fund of Knowledge:  Poor  Language:  Fair  Akathisia:  No  Handed:  Right  AIMS (if indicated):     Assets:  Communication Skills  ADL's:  Intact  Cognition:  Impaired,  Mild  Sleep:  Number of Hours: 7.45     Treatment Plan Summary: Daily contact with patient to assess and evaluate symptoms and progress in treatment and Medication management   A 61 year old male with schizophrenia. Apparently the patient has encountered greatest difficulties in order to receive his long acting injectable medication. Per [facility] collateral information the patient has not been receiving the injection at [facility] because they didn't have a nurse. Apparently [facility] is unable to see him due to his insurance. D/c on 7/11 on prolixin dec and likely has not received the inj since  discharge  Schizoaffective: received prolixin dec 50 mg on 8/9 while in the ER. Next dose on 8/23  Pt was refusing oral medications upon admission. Ordered  non emergency forced medications on 8/10.   He has been taking orally since Friday---Improving  Prolixin has been decreased to 2.5 mg tid instead of 5 mg bid due to orthostatic hypotension and tachycardia. Due to refractory psychosis  a second antipsychotic was added.  Now on abilify  Today, due to orthostatic hypotension I will decrease prolixin to 2.5 mg po bid.  Continue Abilify 15 mg po q day   Mood sx: Continue Depakote 500 mg po BID---level on 8/16 was 76  Tobacco use d/o: Ordered nicotine patch 21 mg  Agitation: not agitated today.  No major incidents since Friday  Insomnia: Has is started not to sleep well again. I will order Klonopin 2 mg daily at bedtime  Vital signs tid--tachycardic will check for orthostatics.  Did have orthostatic yesterday, no today  Diet regular, encourage fluids  Precautions: Fall precautions, every 15 minute checks  Hospitalization status: involuntary commitment  Collateral information is needed from patient's family.  Pt is in need of ACT---will need [employer] founding as he only has medicare.  SW tells me that [employer] will not pay for services if pt has medicaid  Possible d/c 7 days. Still very symptomatic  EKG wnl  Depakote level on 8/16 was 29  SW advised the brother to pursue guardianship  Potential d/c next week after second dose of prolixin which is due on 8/23  No change to discharge plan. Reassured patient that it is likely that he will be able to get out of the hospital this upcoming week.   Alethia Berthold, MD 01/21/2017, 2:21 PM

## 2017-01-21 NOTE — Plan of Care (Signed)
Problem: Safety: Goal: Ability to redirect hostility and anger into socially appropriate behaviors will improve Outcome: Progressing Patient is dressed in street clothes and initiates conversation about plans for discharge.  Suspiciousness and hostility have decreased.  Problem: Coping: Goal: Ability to cope will improve Outcome: Progressing Patient was assertive in trying to explain to writer that "the medications caused constipation and the laxative has caused pain in my abdomen".  Patient states he will be starting "an injection" that will be instead of these pills".  He asked for tylenol for stomach cramps and later was observed sleeping. He is showing cognitive improvement as he tries to engage with staff about the side effects of medication.  He was compliant with medicine this HS.

## 2017-01-21 NOTE — [facility] Group Notes (Signed)
[place] Group Notes:  (Nursing/MHT/Case Management/Adjunct)  Date:  01/21/2017  Time:  9:10 PM  Type of Therapy:  Group Therapy  Participation Level:  Active  Participation Quality:  Appropriate  Affect:  Appropriate  Cognitive:  Alert  Insight:  Good  Engagement in Group:  Engaged  Modes of Intervention:  Support  Summary of Progress/Problems:  Dustin [name] 01/21/2017, 9:10 PM

## 2017-01-21 NOTE — Plan of Care (Signed)
Problem: Role Relationship: Goal: Ability to communicate needs accurately will improve Outcome: Progressing Patient able to verbalize needs to staff.  Problem: Safety: Goal: Ability to remain free from injury will improve Outcome: Progressing Patient remains free from injury on the unit at this time.  Problem: Self-Care: Goal: Ability to participate in self-care as condition permits will improve Outcome: Not Met (add Reason) Patient refuses to participate in self care, states, "No I'm alright."  Problem: Activity: Goal: Interest or engagement in leisure activities will improve Outcome: Not Met (add Reason) Patient continues to lie in bed, no leisure activities noted.

## 2017-01-21 NOTE — Progress Notes (Signed)
Patient is alert and oriented to person, place and time. Skin is warm, dry and intact. No limitations to all four extremities noted. Patient was observed ambulating in hall during the shift with a steady gait. Attends meals in dayroom with minimal peer interaction noted. Took medications this morning without heavy coercion.  Milieu remains therapeutic. Patient will be monitored and physician notified of any acute changes.

## 2017-01-22 MED ORDER — HYDROCORTISONE 1 % EX CREA
TOPICAL_CREAM | Freq: Three times a day (TID) | CUTANEOUS | Status: DC
  Administered 2017-01-22 – 2017-01-25 (×7): via TOPICAL
  Filled 2017-01-22: qty 28

## 2017-01-22 MED ORDER — POLYETHYLENE GLYCOL 3350 17 G PO PACK
17.0000 g | PACK | Freq: Every day | ORAL | Status: DC
  Administered 2017-01-22 – 2017-01-25 (×4): 17 g via ORAL
  Filled 2017-01-22 (×4): qty 1

## 2017-01-22 MED ORDER — CLONAZEPAM 1 MG PO TABS
1.0000 mg | ORAL_TABLET | Freq: Every day | ORAL | Status: DC
  Administered 2017-01-22 – 2017-01-24 (×3): 1 mg via ORAL
  Filled 2017-01-22 (×3): qty 1

## 2017-01-22 NOTE — Progress Notes (Signed)
Recreation Therapy Notes  Date: 08.20.18 Time: 9:30 am Location: Craft Room  Group Topic: Self-expression  Goal Area(s) Addresses:  Patient will identify one color per emotion listed on wheel. Patient will verbalize benefit of using art as a means of self-expression. Patient will verbalize one emotion experienced during session. Patient will be educated on other forms of self-expression.  Behavioral Response: Did not attend  Intervention: Emotion Wheel  Activity: Patients were given an Emotion Wheel worksheet and were instructed to pick a color for each emotion listed.   Education: LRT educated patients on other forms of self-expression.  Education Outcome: Patient did not attend group.   Clinical Observations/Feedback: Patient did not attend group.  Leonette Monarch, LRT/CTRS 01/22/2017 10:04 AM

## 2017-01-22 NOTE — Progress Notes (Signed)
Patient is alert and oriented to person. Skin is warm, dry and intact. No limitations to all four extremities noted. Patient denies SI at this time. Patient was observed ambulating in hall during the shift with a steady gait. Attends meals with minimal peer interaction noted. Compliant with medication administration. Milieu remains therapeutic. Patient will be monitored and physician notified of any acute changes.

## 2017-01-22 NOTE — [facility] Group Notes (Signed)
[facility] LCSW Group Therapy Note  Date/Time: 01/22/17, 1300  Type of Therapy and Topic:  Group Therapy:  Overcoming Obstacles  Participation Level:  Did not attend  Description of Group:    In this group patients will be encouraged to explore what they see as obstacles to their own wellness and recovery. They will be guided to discuss their thoughts, feelings, and behaviors related to these obstacles. The group will process together ways to cope with barriers, with attention given to specific choices patients can make. Each patient will be challenged to identify changes they are motivated to make in order to overcome their obstacles. This group will be process-oriented, with patients participating in exploration of their own experiences as well as giving and receiving support and challenge from other group members.  Therapeutic Goals: 1. Patient will identify personal and current obstacles as they relate to admission. 2. Patient will identify barriers that currently interfere with their wellness or overcoming obstacles.  3. Patient will identify feelings, thought process and behaviors related to these barriers. 4. Patient will identify two changes they are willing to make to overcome these obstacles:    Summary of Patient Progress      Therapeutic Modalities:   Cognitive Behavioral Therapy Solution Focused Therapy Motivational Interviewing Relapse Prevention Therapy  Lurline Idol, LCSW

## 2017-01-22 NOTE — Plan of Care (Signed)
Problem: Safety: Goal: Ability to redirect hostility and anger into socially appropriate behaviors will improve Outcome: Progressing Patient has demonstrated the ability to redirect anger and hostility Goal: Ability to remain free from injury will improve Outcome: Progressing Patient remains free from injury  Problem: Coping: Goal: Ability to verbalize feelings will improve Outcome: Progressing Patient has the ability to verbalize feelings  Problem: Safety: Goal: Ability to disclose and discuss suicidal ideas will improve Outcome: Progressing Patient denies having suicidal ideations

## 2017-01-22 NOTE — [facility] Group Notes (Signed)
[facility] Group Notes:  (Nursing/MHT/Case Management/Adjunct)  Date:  01/22/2017  Time:  9:29 PM  Type of Therapy:  Group Therapy  Participation Level:  Active  Participation Quality:  Appropriate  Affect:  Appropriate  Cognitive:  Appropriate  Insight:  Good  Engagement in Group:  Supportive  Modes of Intervention:  Support  Summary of Progress/Problems:  [name] 01/22/2017, 9:29 PM

## 2017-01-22 NOTE — Tx Team (Signed)
Interdisciplinary Treatment and Diagnostic Plan Update  01/22/2017 Time of Session: 11:15 AM Dustin [name] MRN: [id number]  Principal Diagnosis: Schizophrenia [facility])  Secondary Diagnoses: Principal Problem:   Schizophrenia ([place]) Active Problems:   Tobacco use disorder   Current Medications:  Current Facility-Administered Medications  Medication Dose Route Frequency Provider Last Rate Last Dose  . acetaminophen (TYLENOL) tablet 650 mg  650 mg Oral Q6H PRN [name], [name], MD   650 mg at 01/21/17 2026  . alum & mag hydroxide-simeth (MAALOX/MYLANTA) 200-200-20 MG/5ML suspension 30 mL  30 mL Oral Q4H PRN [name], [name], MD      . ARIPiprazole (ABILIFY) tablet 15 mg  15 mg Oral Daily Hildred Priest, MD   15 mg at 01/22/17 0802  . clonazePAM (KLONOPIN) tablet 1 mg  1 mg Oral QHS Hildred Priest, MD      . divalproex (DEPAKOTE) DR tablet 500 mg  500 mg Oral Q12H Hildred Priest, MD   500 mg at 01/22/17 0802  . fluPHENAZine (PROLIXIN) tablet 2.5 mg  2.5 mg Oral BID Hildred Priest, MD   2.5 mg at 01/22/17 0802  . [START ON 01/25/2017] fluPHENAZine decanoate (PROLIXIN) injection 50 mg  50 mg Intramuscular Q14 Days Hildred Priest, MD      . magnesium hydroxide (MILK OF MAGNESIA) suspension 30 mL  30 mL Oral Daily PRN [name], [name], MD   30 mL at 01/21/17 1701  . nicotine (NICODERM CQ - dosed in mg/24 hours) patch 21 mg  21 mg Transdermal Daily Hernandez-Gonzalez, Seth Bake, MD      . polyethylene glycol (MIRALAX / GLYCOLAX) packet 17 g  17 g Oral Daily Hildred Priest, MD   17 g at 01/22/17 1120   PTA Medications: Prescriptions Prior to Admission  Medication Sig Dispense Refill Last Dose  . diphenhydrAMINE (BENADRYL) 25 mg capsule Take 1 capsule (25 mg total) by mouth at bedtime. 30 capsule 1   . fluPHENAZine decanoate (PROLIXIN) 25 MG/ML injection Inject 2 mLs (50 mg total) into the muscle every 14 (fourteen) days. 5 mL 1   .  zolpidem (AMBIEN) 10 MG tablet Take 1 tablet (10 mg total) by mouth at bedtime. 30 tablet 0     Patient Stressors: Financial difficulties Medication change or noncompliance  Patient Strengths: Technical sales engineer for treatment/growth  Treatment Modalities: Medication Management, Group therapy, Case management,  1 to 1 session with clinician, Psychoeducation, Recreational therapy.   Physician Treatment Plan for Primary Diagnosis: Schizophrenia ([place]) Long Term Goal(s): Improvement in symptoms so as ready for discharge Improvement in symptoms so as ready for discharge   Short Term Goals: Ability to demonstrate self-control will improve Compliance with prescribed medications will improve Ability to identify changes in lifestyle to reduce recurrence of condition will improve Ability to identify and develop effective coping behaviors will improve  Medication Management: Evaluate patient's response, side effects, and tolerance of medication regimen.  Therapeutic Interventions: 1 to 1 sessions, Unit Group sessions and Medication administration.  Evaluation of Outcomes: Progressing  Physician Treatment Plan for Secondary Diagnosis: Principal Problem:   Schizophrenia ([place]) Active Problems:   Tobacco use disorder  Long Term Goal(s): Improvement in symptoms so as ready for discharge Improvement in symptoms so as ready for discharge   Short Term Goals: Ability to demonstrate self-control will improve Compliance with prescribed medications will improve Ability to identify changes in lifestyle to reduce recurrence of condition will improve Ability to identify and develop effective coping behaviors will improve  Medication Management: Evaluate patient's response, side effects, and tolerance of medication regimen.  Therapeutic Interventions: 1 to 1 sessions, Unit Group sessions and Medication administration.  Evaluation of Outcomes: Progressing    RN Treatment Plan  for Primary Diagnosis: Schizophrenia ([place]) Long Term Goal(s): Knowledge of disease and therapeutic regimen to maintain health will improve  Short Term Goals: Ability to remain free from injury will improve, Ability to participate in decision making will improve and Compliance with prescribed medications will improve  Medication Management: RN will administer medications as ordered by provider, will assess and evaluate patient's response and provide education to patient for prescribed medication. RN will report any adverse and/or side effects to prescribing provider.  Therapeutic Interventions: 1 on 1 counseling sessions, Psychoeducation, Medication administration, Evaluate responses to treatment, Monitor vital signs and CBGs as ordered, Perform/monitor CIWA, COWS, AIMS and Fall Risk screenings as ordered, Perform wound care treatments as ordered.  Evaluation of Outcomes: Progressing   LCSW Treatment Plan for Primary Diagnosis: Schizophrenia ([place]) Long Term Goal(s): Safe transition to appropriate next level of care at discharge, Engage patient in therapeutic group addressing interpersonal concerns.  Short Term Goals: Engage patient in aftercare planning with referrals and resources, Increase social support and Increase skills for wellness and recovery  Therapeutic Interventions: Assess for all discharge needs, 1 to 1 time with Social worker, Explore available resources and support systems, Assess for adequacy in community support network, Educate family and significant other(s) on suicide prevention, Complete Psychosocial Assessment, Interpersonal group therapy.  Evaluation of Outcomes: Progressing   Progress in Treatment: Attending groups: No. Participating in groups: No. Taking medication as prescribed: Yes. Toleration medication: Yes. Family/Significant other contact made: Yes, CSW spoke with pt's brother Patient understands diagnosis: No. Discussing patient identified problems/goals  with staff: Yes. Medical problems stabilized or resolved: Yes. Denies suicidal/homicidal ideation: Yes. Issues/concerns per patient self-inventory: No.  New problem(s) identified: No, Describe:  None identified.  New Short Term/Long Term Goal(s): Patient unable to make goal at this time.  Discharge Plan or Barriers: Patient will discharge home and follow-up with [facility] ACT Team.  Reason for Continuation of Hospitalization: Hallucinations Medication stabilization  Estimated Length of Stay: 2-3 days   Attendees: Patient: Dustin [name] 01/22/2017 11:37 AM  Physician: Dr. Merlyn Albert, MD  01/22/2017 11:37 AM  Nursing:  01/22/2017 11:37 AM  RN Care Manager: 01/22/2017 11:37 AM  Social Worker: Glorious Peach, MSW, [place] 01/22/2017 11:37 AM  Recreational Therapist: Drue Flirt, LRT, [place]  01/22/2017 11:37 AM  Other:  01/22/2017 11:37 AM  Other:  01/22/2017 11:37 AM  Other: 01/22/2017 11:37 AM    Scribe for Treatment Team: Emilie Rutter, [place] 01/22/2017 11:37 AM

## 2017-01-22 NOTE — Progress Notes (Signed)
[facility] MD Progress Note  01/22/2017 9:51 AM CHIDERA THIVIERGE  MRN:  [id number] Subjective:  BERL BONFANTI an 61 y.o.male with Schizophrenia who presented to ED under IVC on 8/9. Pt was agitated, loud, uncooperative, paranoid and verbally aggressive. Per petition "trying to insight police to kill him".  According to patient he was at home when police came and entered his home. He does not know why they came. He makes repetitive religious references and reports that his aunt is working with a devil Dr. He endorses recent auditory hallucinations but denies any currently. Denies SI HI or visual hallucinations. Denies taking any antipsychotic or mood stabilizing medications.  Discharge from our unit on 7/11.  At that time he was disorganized and delusional. Place on forced meds.  Finally stabilized on prolixin po and decanoate. He was discharged back to his brother's house.    I imagine he did not f/u or take any meds after discharge.    Per nursing staff: "Patient appears irritable and angry using profanities, affect is blunted, patient was not calm during admission process, he kept using vulgar words and sexual explicit terms and was often told to be mindful of such words. Patient denies SI/AVH but noted responding to internal stimuli. Patient's thoughts are disorganized, speech is pressured and tangential."   Trauma: unknown  Substance abuse denies. Urine toxicology negative. Alcohol below the detection limit. Patient is a heavy smoker.  He is currently on forced medication.  8/11 Patient seen in his room this morning his sitting in his bed with a bright smile. States he is doing quite well. He then makes some inappropriate comments to this physician. He starts winking.   8/12 Patient was seen in his room this morning. He has a sheet over his head and states that he wants to rest and would not talk. Per staff patient continues to be disorganized and irritable. He has been observed to be having  that she told his head since last evening per staff notes. He has insisted with staff that he wants to be left alone. He has not been aggressive on the unit. He has not been attending any groups.  8/13 Patient seen in room wearing scrubs and washing feet in bed with washcloth. He continues to state that he does not want to be here and he is not happy that he has been forced to take medications. He is concerned about receiving injection medication because "he needs a shot like he needs a shot in the head, with a bullet". Patient desires physician to call his brother and gives a phone number. Patient reports that he was given a Bible and he is pleased and extends his thanks. Denies SI/HI/Hallucinations.   8/14 the patient was less irritable today. He actually answer questions let this writer speak. He said he slept well. He denies problems with current medications, denies side effects or physical complaints. Says his appetite is little. Denies any hallucinations or suicidal ideation. Still very upset about the police showing out at his place. He reluctantly taking his medications.  8/15 Pt has been compliant with oral meds but is still very reluctant about taking them.  Denies SI/HI or hallucinations. Denies SE from medications other than mild constipation.  Per staff he is eating and sleeping well.  During assessment today he was hyper religious, talking about the magic in the bible, vooddo and courses.  Thought process was disorganized and hard to follow  8/16 patient says that medications dry his brain.  He said he is taking too many medications. He continues to believe that he does not need medications. It is very unlikely that he will continue any medications after discharge. He does not see why he is advised to take them. He says he does not have schizophrenia.  Says that he was kidnapped by police. He says being here is against his will. 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  8/17 argumentative, irritable, says he didn't sleep at all last night. He says he doesn't need to be here in the hospital denies having schizophrenia. He says that this is all [place] trying to using as a [place] pig.  Follow-up for Saturday the 18th. Compared to previous notes he seems to be doing much better today. I found him actually polite and insightful. He says he is looking forward to going home with the assistance of his case manager. Denies that he's having any hallucinations. Denies suicidal or homicidal thoughts. Says he actually appreciates the medicine. He is feeling tired during the day but not excessively so and has no medical complaints.  Follow-up Sunday the 19th. Patient has no new complaints. He is up out of his bed and able to interact reasonably. He is anxious about going home but is not aggressive or threatening. Reports that he is looking forward to getting discharged and having someone help him at home.  8/20 much more pleasant and cooperative. He was engaged in the assessment. His questions were appropriate. He was not disorganized. He was later on seen in the dayroom having social interaction with peers. He is not argumentative about his medications or following up with ACT team. He is okay with the plans of receiving a Prolixin injection on Thursday and discharged after that.  Side effects he reports constipation and sedation today.  Denies hallucinations, SI, HI or any other physical complaints or side effects.  Per Nursing Patient was assertive in trying to explain to writer that "the medications caused constipation and the laxative has caused pain in my abdomen".  Patient states he will be starting "an injection" that will be instead of these pills".  He asked for tylenol for stomach cramps and later was observed sleeping. He is showing cognitive improvement as he tries to engage with staff about the side effects of medication.   He was compliant with medicine this HS.  Patient is alert and oriented to person, place and time. Skin is warm, dry and intact. No limitations to all four extremities noted. Patient was observed ambulating in hall during the shift with a steady gait. Attends meals in dayroom with minimal peer interaction noted. Took medications this morning without heavy coercion.  Milieu remains therapeutic. Patient will be monitored and physician notified of any acute changes.   Principal Problem: Schizophrenia ([place]) Diagnosis:   Patient Active Problem List   Diagnosis Date Noted  . Schizophrenia ([place]) [F20.9] 01/11/2017  . Tobacco use disorder [F17.200] 11/29/2016   Total Time spent with patient: 30 minutes  Past Psychiatric History: Patient was seeing a psychiatrist at [facility] He has been treated in the past with Prolixin injectable   Past Medical History:  Past Medical History:  Diagnosis Date  . Schizophrenia ([place])    History reviewed. No pertinent surgical history.  Family History: History reviewed. No pertinent family history.   Family Psychiatric  History: Unknown  Social History:  History  Alcohol Use No  History  Drug Use No    Social History   Social History  . Marital status: Single    Spouse name: N/A  . Number of children: N/A  . Years of education: N/A   Social History Main Topics  . Smoking status: Current Every Day Smoker    Packs/day: 1.00    Years: 15.00  . Smokeless tobacco: Never Used  . Alcohol use No  . Drug use: No  . Sexual activity: No   Other Topics Concern  . None   Social History Narrative  . None     Current Medications: Current Facility-Administered Medications  Medication Dose Route Frequency Provider Last Rate Last Dose  . acetaminophen (TYLENOL) tablet 650 mg  650 mg Oral Q6H PRN [name], [name], MD   650 mg at 01/21/17 2026  . alum & mag hydroxide-simeth (MAALOX/MYLANTA) 200-200-20 MG/5ML suspension 30 mL  30 mL Oral  Q4H PRN [name], [name], MD      . ARIPiprazole (ABILIFY) tablet 15 mg  15 mg Oral Daily Hildred Priest, MD   15 mg at 01/22/17 0802  . clonazePAM (KLONOPIN) tablet 1 mg  1 mg Oral QHS Hildred Priest, MD      . divalproex (DEPAKOTE) DR tablet 500 mg  500 mg Oral Q12H Hildred Priest, MD   500 mg at 01/22/17 0802  . fluPHENAZine (PROLIXIN) tablet 2.5 mg  2.5 mg Oral BID Hildred Priest, MD   2.5 mg at 01/22/17 0802  . [START ON 01/25/2017] fluPHENAZine decanoate (PROLIXIN) injection 50 mg  50 mg Intramuscular Q14 Days Hildred Priest, MD      . magnesium hydroxide (MILK OF MAGNESIA) suspension 30 mL  30 mL Oral Daily PRN [name], [name], MD   30 mL at 01/21/17 1701  . nicotine (NICODERM CQ - dosed in mg/24 hours) patch 21 mg  21 mg Transdermal Daily Hernandez-Gonzalez, Seth Bake, MD      . polyethylene glycol (MIRALAX / GLYCOLAX) packet 17 g  17 g Oral Daily Hildred Priest, MD        Lab Results:  No results found for this or any previous visit (from the past 48 hour(s)).  Blood Alcohol level:  Lab Results  Component Value Date   ETH <5 01/11/2017   ETH <5 40/98/1191    Metabolic Disorder Labs: Lab Results  Component Value Date   HGBA1C 5.3 01/12/2017   MPG 105.41 01/12/2017   MPG 114 12/13/2016   No results found for: PROLACTIN Lab Results  Component Value Date   CHOL 149 01/12/2017   TRIG 48 01/12/2017   HDL 51 01/12/2017   CHOLHDL 2.9 01/12/2017   VLDL 10 01/12/2017   LDLCALC 88 01/12/2017   LDLCALC 65 12/13/2016    Physical Findings: AIMS: Facial and Oral Movements Muscles of Facial Expression: None, normal Lips and Perioral Area: None, normal Jaw: None, normal Tongue: None, normal,Extremity Movements Upper (arms, wrists, hands, fingers): None, normal Lower (legs, knees, ankles, toes): None, normal, Trunk Movements Neck, shoulders, hips: None, normal, Overall Severity Severity of abnormal movements  (highest score from questions above): None, normal Incapacitation due to abnormal movements: None, normal Patient's awareness of abnormal movements (rate only patient's report): No Awareness, Dental Status Current problems with teeth and/or dentures?: No Does patient usually wear dentures?: Yes  CIWA:    COWS:  COWS Total Score: 0  Musculoskeletal: Strength & Muscle Tone: within normal limits Gait & Station: normal Patient leans: N/A  Psychiatric Specialty Exam: Physical Exam  Nursing  note and vitals reviewed. Constitutional: He is oriented to person, place, and time. He appears well-developed and well-nourished.  HENT:  Head: Normocephalic and atraumatic.  Eyes: EOM are normal.  Neck: Normal range of motion.  Cardiovascular: Regular rhythm.   Respiratory: Effort normal. No respiratory distress.  Musculoskeletal: Normal range of motion.  Neurological: He is alert and oriented to person, place, and time.  Psychiatric: His speech is normal. Judgment normal. He expresses no homicidal and no suicidal ideation. He expresses no suicidal plans and no homicidal plans.    Review of Systems  Constitutional: Negative.   HENT: Negative.   Eyes: Negative.   Respiratory: Negative.   Cardiovascular: Negative.   Gastrointestinal: Negative.   Genitourinary: Negative.   Musculoskeletal: Negative.   Skin: Negative.   Neurological: Negative.   Endo/Heme/Allergies: Negative.   Psychiatric/Behavioral: Negative for depression, hallucinations, memory loss, substance abuse and suicidal ideas. The patient is not nervous/anxious and does not have insomnia.     Blood pressure 99/77, pulse 84, temperature 97.8 F (36.6 C), temperature source Oral, resp. rate 20, height 5\' 9"  (1.753 m), weight 72.3 kg (159 lb 6.4 oz), SpO2 100 %.Body mass index is 23.54 kg/m.  General Appearance: Casual  Eye Contact:  Fair  Speech:  Garbled  Volume:  Normal  Mood:  Euthymic  Affect:  Appropriate and Congruent   Thought Process:  Linear and Descriptions of Associations: Loose  Orientation:  Full (Time, Place, and Person)  Thought Content:  Paranoid Ideation  Suicidal Thoughts:  No  Homicidal Thoughts:  No  Memory:  Immediate;   Poor Recent;   Poor Remote;   Poor  Judgement:  Impaired  Insight:  Lacking  Psychomotor Activity:  Increased  Concentration:  Concentration: Fair and Attention Span: Fair  Recall:  Poor  Fund of Knowledge:  Poor  Language:  Fair  Akathisia:  No  Handed:  Right  AIMS (if indicated):     Assets:  Communication Skills  ADL's:  Intact  Cognition:  Impaired,  Mild  Sleep:  Number of Hours: 7.3     Treatment Plan Summary: Daily contact with patient to assess and evaluate symptoms and progress in treatment and Medication management   A 61 year old male with schizophrenia. Apparently the patient has encountered greatest difficulties in order to receive his long acting injectable medication. Per [facility] collateral information the patient has not been receiving the injection at [facility] because they didn't have a nurse. Apparently [facility] is unable to see him due to his insurance. D/c on 7/11 on prolixin dec and likely has not received the inj since discharge  Schizoaffective: received prolixin dec 50 mg on 8/9 while in the ER. Next dose on 8/23  Pt was refusing oral medications upon admission. Ordered  non emergency forced medications on 8/10.   He has been taking orally since Friday---Improving  Prolixin has been decreased to 2.5 mg bid instead of 5 mg bid due to orthostatic hypotension and tachycardia. Due to refractory psychosis  a second antipsychotic was added.  Now on abilify 15 mg q day    Mood sx: Continue Depakote 500 mg po BID---level on 8/16 was 76  Tobacco use d/o: Ordered nicotine patch 21 mg  Agitation: not agitated today.  No major incidents since Friday  Insomnia: sleeping better.  C/o some sedation yesterday.  Will decrease klonopin to 1 mg  qhs  Diet regular, encourage fluids  Precautions: Fall precautions, every 15 minute checks  Hospitalization status: involuntary commitment  Collateral information  is needed from patient's family.  Pt is in need of ACT---[facility] has agreed to f/u with pt  Possible d/c after his second inj on thursday  EKG wnl  Depakote level on 8/16 was 34  SW advised the brother to pursue guardianship     Hildred Priest, MD 01/22/2017, 9:51 AM

## 2017-01-23 MED ORDER — FLUPHENAZINE HCL 5 MG PO TABS: 2.5000 mg | ORAL_TABLET | Freq: Every day | ORAL | Status: DC

## 2017-01-23 NOTE — [facility] Group Notes (Signed)
[place] LCSW Group Therapy Note  Date/Time: 01/23/17, 1300  Type of Therapy/Topic:  Group Therapy:  Feelings about Diagnosis  Participation Level:  Active   Mood:pleasant   Description of Group:    This group will allow patients to explore their thoughts and feelings about diagnoses they have received. Patients will be guided to explore their level of understanding and acceptance of these diagnoses. Facilitator will encourage patients to process their thoughts and feelings about the reactions of others to their diagnosis, and will guide patients in identifying ways to discuss their diagnosis with significant others in their lives. This group will be process-oriented, with patients participating in exploration of their own experiences as well as giving and receiving support and challenge from other group members.   Therapeutic Goals: 1. Patient will demonstrate understanding of diagnosis as evidence by identifying two or more symptoms of the disorder:  2. Patient will be able to express two feelings regarding the diagnosis 3. Patient will demonstrate ability to communicate their needs through discussion and/or role plays  Summary of Patient Progress: Pt shared that his diagnosis is schizophrenia and pt was able to share several related symptoms.  Pt continues to talk frequently about problems with his family and made numerous contributions to the group discussion that were completely off the topic of the group.  Pt is polite and appropriate with his behavior.        Therapeutic Modalities:   Cognitive Behavioral Therapy Brief Therapy Feelings Identification   Lurline Idol, LCSW

## 2017-01-23 NOTE — [facility] Group Notes (Signed)
Goals Group  Date/Time: 01/23/2017, 9:00 AM Type of Therapy and Topic: Group Therapy: Goals Group: SMART Goals  ?  Participation Level: Moderate  ?  Description of Group:  ?  The purpose of a daily goals group is to assist and guide patients in setting recovery/wellness-related goals. The objective is to set goals as they relate to the crisis in which they were admitted. Patients will be using SMART goal modalities to set measurable goals. Characteristics of realistic goals will be discussed and patients will be assisted in setting and processing how one will reach their goal. Facilitator will also assist patients in applying interventions and coping skills learned in psycho-education groups to the SMART goal and process how one will achieve defined goal.  ?  Therapeutic Goals:  ?  -Patients will develop and document one goal related to or their crisis in which brought them into treatment.  -Patients will be guided by LCSW using SMART goal setting modality in how to set a measurable, attainable, realistic and time sensitive goal.  -Patients will process barriers in reaching goal.  -Patients will process interventions in how to overcome and successful in reaching goal.  ?  Patient's Goal:  Patient stated that his goal is to follow rules that will allow him to discharge. In order to accomplish this goal he stated that he will go to groups and talk with his doctor. ?  Therapeutic Modalities:  Motivational Interviewing  Cognitive Behavioral Therapy  Crisis Intervention Model  SMART goals setting  Glorious Peach, MSW, LCSW-A 01/23/2017, 9:51AM

## 2017-01-23 NOTE — Plan of Care (Signed)
Problem: Safety: Goal: Ability to disclose and discuss suicidal ideas will improve Outcome: Progressing Patient denies SI but stated, "If I wanted to hurt myself I would tell you Ms. Lady."  Problem: Self-Concept: Goal: Ability to verbalize positive feelings about self will improve Outcome: Progressing Patient verbalizes that he feels much better about himself. Goal: Level of anxiety will decrease Outcome: Progressing Patient does not verbalize any anxiety.  Problem: Safety: Goal: Ability to remain free from injury will improve Outcome: Progressing Patient remains free from injury

## 2017-01-23 NOTE — Progress Notes (Signed)
Patient is alert and oriented to person, place and time. Skin is warm, dry and intact. No limitations to all four extremities noted. Patient denies SI/HI, AVH at this time. Patient was observed ambulating in hall during the shift with a steady gait. Patient is compliant with mediations and attends meals with peer interaction noted. Patient went to court this morning for his competency hearing, transported by [facility], returned to the unit without any issues. Milieu remains therapeutic. Patient will be monitored and physician notified of any acute changes.

## 2017-01-23 NOTE — Progress Notes (Signed)
Recreation Therapy Notes  Date: 08.21.18 Time: 9:30 am Location: Craft Room  Group Topic: Goal Setting  Goal Area(s) Addresses:  Patient will write at least one goal. Patient will write at least one obstacle.  Behavioral Response: Did not attend  Intervention: Recovery Goal Chart  Activity: Patients were instructed to make a Recovery Goal Chart including their goals, obstacles, the date they started working on their goals, and the date they achieved their goals.  Education: LRT educated patients on healthy ways to celebrate reaching their goals.  Education Outcome: Patient did not attend group.  Clinical Observations/Feedback: Patient did not attend group.  Leonette Monarch, LRT/CTRS 01/23/2017 10:07 AM

## 2017-01-23 NOTE — Progress Notes (Addendum)
[facility] MD Progress Note  01/23/2017 9:23 AM Dustin [name]  MRN:  [id number] Subjective:  Dustin [name] an 61 y.o.male with Schizophrenia who presented to ED under IVC on 8/9. Pt was agitated, loud, uncooperative, paranoid and verbally aggressive. Per petition "trying to insight police to kill him".  According to patient he was at home when police came and entered his home. He does not know why they came. He makes repetitive religious references and reports that his aunt is working with a devil Dr. He endorses recent auditory hallucinations but denies any currently. Denies SI HI or visual hallucinations. Denies taking any antipsychotic or mood stabilizing medications.  Discharge from our unit on 7/11.  At that time he was disorganized and delusional. Place on forced meds.  Finally stabilized on prolixin po and decanoate. He was discharged back to his brother's house.    I imagine he did not f/u or take any meds after discharge.    Per nursing staff: "Patient appears irritable and angry using profanities, affect is blunted, patient was not calm during admission process, he kept using vulgar words and sexual explicit terms and was often told to be mindful of such words. Patient denies SI/AVH but noted responding to internal stimuli. Patient's thoughts are disorganized, speech is pressured and tangential."   Trauma: unknown  Substance abuse denies. Urine toxicology negative. Alcohol below the detection limit. Patient is a heavy smoker.  He is currently on forced medication.  8/11 Patient seen in his room this morning his sitting in his bed with a bright smile. States he is doing quite well. He then makes some inappropriate comments to this physician. He starts winking.   8/12 Patient was seen in his room this morning. He has a sheet over his head and states that he wants to rest and would not talk. Per staff patient continues to be disorganized and irritable. He has been observed to be having  that she told his head since last evening per staff notes. He has insisted with staff that he wants to be left alone. He has not been aggressive on the unit. He has not been attending any groups.  8/13 Patient seen in room wearing scrubs and washing feet in bed with washcloth. He continues to state that he does not want to be here and he is not happy that he has been forced to take medications. He is concerned about receiving injection medication because "he needs a shot like he needs a shot in the head, with a bullet". Patient desires physician to call his brother and gives a phone number. Patient reports that he was given a Bible and he is pleased and extends his thanks. Denies SI/HI/Hallucinations.   8/14 the patient was less irritable today. He actually answer questions let this writer speak. He said he slept well. He denies problems with current medications, denies side effects or physical complaints. Says his appetite is little. Denies any hallucinations or suicidal ideation. Still very upset about the police showing out at his place. He reluctantly taking his medications.  8/15 Pt has been compliant with oral meds but is still very reluctant about taking them.  Denies SI/HI or hallucinations. Denies SE from medications other than mild constipation.  Per staff he is eating and sleeping well.  During assessment today he was hyper religious, talking about the magic in the bible, vooddo and courses.  Thought process was disorganized and hard to follow  8/16 patient says that medications dry his brain.  He said he is taking too many medications. He continues to believe that he does not need medications. It is very unlikely that he will continue any medications after discharge. He does not see why he is advised to take them. He says he does not have schizophrenia.  Says that he was kidnapped by police. He says being here is against his will. 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  8/17 argumentative, irritable, says he didn't sleep at all last night. He says he doesn't need to be here in the hospital denies having schizophrenia. He says that this is all [place] trying to using as a [place] pig.  Follow-up for Saturday the 18th. Compared to previous notes he seems to be doing much better today. I found him actually polite and insightful. He says he is looking forward to going home with the assistance of his case manager. Denies that he's having any hallucinations. Denies suicidal or homicidal thoughts. Says he actually appreciates the medicine. He is feeling tired during the day but not excessively so and has no medical complaints.  Follow-up Sunday the 19th. Patient has no new complaints. He is up out of his bed and able to interact reasonably. He is anxious about going home but is not aggressive or threatening. Reports that he is looking forward to getting discharged and having someone help him at home.  8/20 much more pleasant and cooperative. He was engaged in the assessment. His questions were appropriate. He was not disorganized. He was later on seen in the dayroom having social interaction with peers. He is not argumentative about his medications or following up with ACT team. He is okay with the plans of receiving a Prolixin injection on Thursday and discharged after that.  Side effects he reports constipation and sedation today.  Denies hallucinations, SI, HI or any other physical complaints or side effects.  8/21 pleasant and cooperative this morning. He was getting ready to go to his involuntary commitment hearing. Talking about Cleophus Molt. He Has Been Compliant with Medications. He Continues to Sleep Very Well. He Is No Longer Argumentative about Having to Take Medications, Staying Here or Following up with a Psychiatrist.  Per Nursing Patient is alert and oriented to person. Skin is warm, dry and intact. No  limitations to all four extremities noted. Patient denies SI at this time. Patient was observed ambulating in hall during the shift with a steady gait. Attends meals with minimal peer interaction noted. Compliant with medication administration. Milieu remains therapeutic. Patient will be monitored and physician notified of any acute changes.   Principal Problem: Schizophrenia ([place]) Diagnosis:   Patient Active Problem List   Diagnosis Date Noted  . Schizophrenia ([place]) [F20.9] 01/11/2017  . Tobacco use disorder [F17.200] 11/29/2016   Total Time spent with patient: 30 minutes  Past Psychiatric History: Patient was seeing a psychiatrist at [facility] He has been treated in the past with Prolixin injectable   Past Medical History:  Past Medical History:  Diagnosis Date  . Schizophrenia ([place])    History reviewed. No pertinent surgical history.  Family History: History reviewed. No pertinent family history.   Family Psychiatric  History: Unknown  Social History:  History  Alcohol Use No     History  Drug Use No    Social History   Social History  . Marital status: Single    Spouse name: N/A  . Number of  children: N/A  . Years of education: N/A   Social History Main Topics  . Smoking status: Current Every Day Smoker    Packs/day: 1.00    Years: 15.00  . Smokeless tobacco: Never Used  . Alcohol use No  . Drug use: No  . Sexual activity: No   Other Topics Concern  . None   Social History Narrative  . None     Current Medications: Current Facility-Administered Medications  Medication Dose Route Frequency Provider Last Rate Last Dose  . acetaminophen (TYLENOL) tablet 650 mg  650 mg Oral Q6H PRN [name], [name], MD   650 mg at 01/21/17 2026  . alum & mag hydroxide-simeth (MAALOX/MYLANTA) 200-200-20 MG/5ML suspension 30 mL  30 mL Oral Q4H PRN [name], [name], MD      . ARIPiprazole (ABILIFY) tablet 15 mg  15 mg Oral Daily Hildred Priest, MD   15  mg at 01/23/17 0757  . clonazePAM (KLONOPIN) tablet 1 mg  1 mg Oral QHS Hildred Priest, MD   1 mg at 01/22/17 2108  . divalproex (DEPAKOTE) DR tablet 500 mg  500 mg Oral Q12H Hildred Priest, MD   500 mg at 01/23/17 0757  . fluPHENAZine (PROLIXIN) tablet 2.5 mg  2.5 mg Oral BID Hildred Priest, MD   2.5 mg at 01/23/17 0757  . [START ON 01/25/2017] fluPHENAZine decanoate (PROLIXIN) injection 50 mg  50 mg Intramuscular Q14 Days Hildred Priest, MD      . hydrocortisone cream 1 %   Topical TID Hildred Priest, MD      . magnesium hydroxide (MILK OF MAGNESIA) suspension 30 mL  30 mL Oral Daily PRN [name], [name], MD   30 mL at 01/21/17 1701  . nicotine (NICODERM CQ - dosed in mg/24 hours) patch 21 mg  21 mg Transdermal Daily Hernandez-Gonzalez, Seth Bake, MD      . polyethylene glycol (MIRALAX / GLYCOLAX) packet 17 g  17 g Oral Daily Hildred Priest, MD   17 g at 01/23/17 0757    Lab Results:  No results found for this or any previous visit (from the past 48 hour(s)).  Blood Alcohol level:  Lab Results  Component Value Date   ETH <5 01/11/2017   ETH <5 50/53/9767    Metabolic Disorder Labs: Lab Results  Component Value Date   HGBA1C 5.3 01/12/2017   MPG 105.41 01/12/2017   MPG 114 12/13/2016   No results found for: PROLACTIN Lab Results  Component Value Date   CHOL 149 01/12/2017   TRIG 48 01/12/2017   HDL 51 01/12/2017   CHOLHDL 2.9 01/12/2017   VLDL 10 01/12/2017   LDLCALC 88 01/12/2017   LDLCALC 65 12/13/2016    Physical Findings: AIMS: Facial and Oral Movements Muscles of Facial Expression: None, normal Lips and Perioral Area: None, normal Jaw: None, normal Tongue: None, normal,Extremity Movements Upper (arms, wrists, hands, fingers): None, normal Lower (legs, knees, ankles, toes): None, normal, Trunk Movements Neck, shoulders, hips: None, normal, Overall Severity Severity of abnormal movements (highest  score from questions above): None, normal Incapacitation due to abnormal movements: None, normal Patient's awareness of abnormal movements (rate only patient's report): No Awareness, Dental Status Current problems with teeth and/or dentures?: No Does patient usually wear dentures?: Yes  CIWA:    COWS:  COWS Total Score: 0  Musculoskeletal: Strength & Muscle Tone: within normal limits Gait & Station: normal Patient leans: N/A  Psychiatric Specialty Exam: Physical Exam  Nursing note and vitals reviewed. Constitutional: He  is oriented to person, place, and time. He appears well-developed and well-nourished.  HENT:  Head: Normocephalic and atraumatic.  Eyes: EOM are normal.  Neck: Normal range of motion.  Cardiovascular: Regular rhythm.   Respiratory: Effort normal. No respiratory distress.  Musculoskeletal: Normal range of motion.  Neurological: He is alert and oriented to person, place, and time.  Psychiatric: His speech is normal. Judgment normal. He expresses no homicidal and no suicidal ideation. He expresses no suicidal plans and no homicidal plans.    Review of Systems  Constitutional: Negative.   HENT: Negative.   Eyes: Negative.   Respiratory: Negative.   Cardiovascular: Negative.   Gastrointestinal: Negative.   Genitourinary: Negative.   Musculoskeletal: Negative.   Skin: Negative.   Neurological: Negative.   Endo/Heme/Allergies: Negative.   Psychiatric/Behavioral: Negative for depression, hallucinations, memory loss, substance abuse and suicidal ideas. The patient is not nervous/anxious and does not have insomnia.     Blood pressure (!) 99/53, pulse (!) 53, temperature 98.3 F (36.8 C), temperature source Oral, resp. rate 20, height 5\' 9"  (1.753 m), weight 72.3 kg (159 lb 6.4 oz), SpO2 100 %.Body mass index is 23.54 kg/m.  General Appearance: Casual  Eye Contact:  Fair  Speech:  Garbled  Volume:  Normal  Mood:  Euthymic  Affect:  Appropriate and Congruent   Thought Process:  Linear and Descriptions of Associations: Loose  Orientation:  Full (Time, Place, and Person)  Thought Content:  Paranoid Ideation  Suicidal Thoughts:  No  Homicidal Thoughts:  No  Memory:  Immediate;   Poor Recent;   Poor Remote;   Poor  Judgement:  Impaired  Insight:  Lacking  Psychomotor Activity:  Increased  Concentration:  Concentration: Fair and Attention Span: Fair  Recall:  Poor  Fund of Knowledge:  Poor  Language:  Fair  Akathisia:  No  Handed:  Right  AIMS (if indicated):     Assets:  Communication Skills  ADL's:  Intact  Cognition:  Impaired,  Mild  Sleep:  Number of Hours: 6.75     Treatment Plan Summary: Daily contact with patient to assess and evaluate symptoms and progress in treatment and Medication management   A 61 year old male with schizophrenia. Apparently the patient has encountered greatest difficulties in order to receive his long acting injectable medication. Per [facility] collateral information the patient has not been receiving the injection at [facility] because they didn't have a nurse. Apparently [facility] is unable to see him due to his insurance. D/c on 7/11 on prolixin dec and likely has not received the inj since discharge  Schizoaffective: received prolixin dec 50 mg on 8/9 while in the ER. Next dose on 8/23  Pt was refusing oral medications upon admission. Ordered  non emergency forced medications on 8/10.   He has been taking orally since Friday---Improving  Prolixin has been decreased to 2.5 mg bid instead of 5 mg bid due to orthostatic hypotension and tachycardia. BP continues to be low will d/c prolixin oral.  He is due for inj of prolixin on Thursday.  Due to refractory psychosis  a second antipsychotic was added.  Now on abilify 15 mg q day    Mood sx: Continue Depakote 500 mg po BID---level on 8/16 was 76  Tobacco use d/o: Ordered nicotine patch 21 mg  Agitation: not agitated today.  No major incidents since  Friday  Insomnia: sleeping better.  C/o some sedation yesterday.  Will decrease klonopin to 1 mg qhs  Constipation: continue miralax daily.  Pt is also c/o hemorrhoids prep H ordered tid  Diet regular, encourage fluids  Precautions: Fall precautions, every 15 minute checks  Hospitalization status: involuntary commitment  Collateral information is needed from patient's family.  Pt is in need of ACT---[facility] has agreed to f/u with pt  Possible d/c after his second inj on thursday  EKG wnl  Depakote level on 8/16 was 42  SW advised the brother to pursue guardianship  I certify that the services received since the previous certification/recertification were and continue to be medically necessary as the treatment provided can be reasonably expected to improve the patient's condition; the medical record documents that the services furnished were intensive treatment services or their equivalent services, and this patient continues to need, on a daily basis, active treatment furnished directly by or requiring the supervision of inpatient psychiatric personnel.     Hildred Priest, MD 01/23/2017, 9:23 AM

## 2017-01-24 DIAGNOSIS — F250 Schizoaffective disorder, bipolar type: Secondary | ICD-10-CM

## 2017-01-24 DIAGNOSIS — G2401 Drug induced subacute dyskinesia: Secondary | ICD-10-CM

## 2017-01-24 MED ORDER — FLUPHENAZINE DECANOATE 25 MG/ML IJ SOLN
50.0000 mg | INTRAMUSCULAR | Status: DC
  Administered 2017-01-25: 50 mg via INTRAMUSCULAR
  Filled 2017-01-24: qty 2

## 2017-01-24 MED ORDER — ARIPIPRAZOLE 15 MG PO TABS: 15.0000 mg | ORAL_TABLET | Freq: Every day | ORAL | 0 refills | Status: DC

## 2017-01-24 MED ORDER — ZOLPIDEM TARTRATE 10 MG PO TABS: 10.0000 mg | ORAL_TABLET | Freq: Every evening | ORAL | 0 refills | Status: DC | PRN

## 2017-01-24 MED ORDER — DIVALPROEX SODIUM 500 MG PO DR TAB: 500.0000 mg | DELAYED_RELEASE_TABLET | Freq: Two times a day (BID) | ORAL | 0 refills | Status: DC

## 2017-01-24 MED ORDER — FLUPHENAZINE DECANOATE 25 MG/ML IJ SOLN: 50.0000 mg | INTRAMUSCULAR | 0 refills | Status: DC

## 2017-01-24 NOTE — Plan of Care (Signed)
Problem: Education: Goal: Knowledge of the prescribed therapeutic regimen will improve Outcome: Progressing Patient is knowledgeable of his prescribed medicine.

## 2017-01-24 NOTE — Progress Notes (Signed)
Recreation Therapy Notes  Date: 08.22.18 Time: 9:30 am Location: Craft Room  Group Topic: Self-esteem  Goal Area(s) Addresses:  Patient will write at least one positive trait about self. Patient will verbalize benefit of having a healthy self-esteem.  Behavioral Response: Did not attend  Intervention: I Am  Activity: Patients were given worksheets with the letter I on them and were instructed to write as many positive traits about themselves inside the letter.  Education: LRT educated patients on ways to increase their self-esteem.  Education Outcome: Patient did not attend group.   Clinical Observations/Feedback: Patient did not attend group.  Leonette Monarch, LRT/CTRS 01/24/2017 10:09 AM

## 2017-01-24 NOTE — Progress Notes (Signed)
D: Pt denies SI/HI/AVH, affect is flat, but brightens upon approach. Pt is pleasant and cooperative. Pt appears less anxious and he is interacting with peers and staff appropriately. Patient's thoughts are organized no bizarre behavior noted during the shift. A: Pt was offered support and encouragement. Pt was given scheduled medications. Pt was encouraged to attend groups. Q 15 minute checks were done for safety.  R:Pt attends groups and interacts well with peers and staff. Pt is taking medication. Pt has no complaints.Pt receptive to treatment and safety maintained on unit.

## 2017-01-24 NOTE — Progress Notes (Signed)
Pleasant and cooperative.  Bright affect.  Visible in the milieu.  Interacting with peers.  Verbalizes that he is ready to go home and requesting to get his shot.  States that it time for his next injection and wants to get it here.  Instructed him to talk with the doctor concerning his injection.  Support and encouragement offered. Safety checks maintained.  Scheduled medications given.

## 2017-01-24 NOTE — Progress Notes (Signed)
[facility] MD Progress Note  01/24/2017 9:45 AM Dustin [name]  MRN:  [id number] Subjective:  Dustin [name] an 61 y.o.male with Schizophrenia who presented to ED under IVC on 8/9. Pt was agitated, loud, uncooperative, paranoid and verbally aggressive. Per petition "trying to insight police to kill him".  According to patient he was at home when police came and entered his home. He does not know why they came. He makes repetitive religious references and reports that his aunt is working with a devil Dr. He endorses recent auditory hallucinations but denies any currently. Denies SI HI or visual hallucinations. Denies taking any antipsychotic or mood stabilizing medications.  Discharge from our unit on 7/11.  At that time he was disorganized and delusional. Place on forced meds.  Finally stabilized on prolixin po and decanoate. He was discharged back to his brother's house.    I imagine he did not f/u or take any meds after discharge.    Per nursing staff: "Patient appears irritable and angry using profanities, affect is blunted, patient was not calm during admission process, he kept using vulgar words and sexual explicit terms and was often told to be mindful of such words. Patient denies SI/AVH but noted responding to internal stimuli. Patient's thoughts are disorganized, speech is pressured and tangential."   Trauma: unknown  Substance abuse denies. Urine toxicology negative. Alcohol below the detection limit. Patient is a heavy smoker.  He is currently on forced medication.  8/11 Patient seen in his room this morning his sitting in his bed with a bright smile. States he is doing quite well. He then makes some inappropriate comments to this physician. He starts winking.   8/12 Patient was seen in his room this morning. He has a sheet over his head and states that he wants to rest and would not talk. Per staff patient continues to be disorganized and irritable. He has been observed to be having  that she told his head since last evening per staff notes. He has insisted with staff that he wants to be left alone. He has not been aggressive on the unit. He has not been attending any groups.  8/13 Patient seen in room wearing scrubs and washing feet in bed with washcloth. He continues to state that he does not want to be here and he is not happy that he has been forced to take medications. He is concerned about receiving injection medication because "he needs a shot like he needs a shot in the head, with a bullet". Patient desires physician to call his brother and gives a phone number. Patient reports that he was given a Bible and he is pleased and extends his thanks. Denies SI/HI/Hallucinations.   8/14 the patient was less irritable today. He actually answer questions let this writer speak. He said he slept well. He denies problems with current medications, denies side effects or physical complaints. Says his appetite is little. Denies any hallucinations or suicidal ideation. Still very upset about the police showing out at his place. He reluctantly taking his medications.  8/15 Pt has been compliant with oral meds but is still very reluctant about taking them.  Denies SI/HI or hallucinations. Denies SE from medications other than mild constipation.  Per staff he is eating and sleeping well.  During assessment today he was hyper religious, talking about the magic in the bible, vooddo and courses.  Thought process was disorganized and hard to follow  8/16 patient says that medications dry his brain.  He said he is taking too many medications. He continues to believe that he does not need medications. It is very unlikely that he will continue any medications after discharge. He does not see why he is advised to take them. He says he does not have schizophrenia.  Says that he was kidnapped by police. He says being here is against his will. 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  8/17 argumentative, irritable, says he didn't sleep at all last night. He says he doesn't need to be here in the hospital denies having schizophrenia. He says that this is all [place] trying to using as a [place] pig.  Follow-up for Saturday the 18th. Compared to previous notes he seems to be doing much better today. I found him actually polite and insightful. He says he is looking forward to going home with the assistance of his case manager. Denies that he's having any hallucinations. Denies suicidal or homicidal thoughts. Says he actually appreciates the medicine. He is feeling tired during the day but not excessively so and has no medical complaints.  Follow-up Sunday the 19th. Patient has no new complaints. He is up out of his bed and able to interact reasonably. He is anxious about going home but is not aggressive or threatening. Reports that he is looking forward to getting discharged and having someone help him at home.  8/20 much more pleasant and cooperative. He was engaged in the assessment. His questions were appropriate. He was not disorganized. He was later on seen in the dayroom having social interaction with peers. He is not argumentative about his medications or following up with ACT team. He is okay with the plans of receiving a Prolixin injection on Thursday and discharged after that.  Side effects he reports constipation and sedation today.  Denies hallucinations, SI, HI or any other physical complaints or side effects.  8/21 pleasant and cooperative this morning. He was getting ready to go to his involuntary commitment hearing. Talking about Cleophus Molt. He Has Been Compliant with Medications. He Continues to Sleep Very Well. He Is No Longer Argumentative about Having to Take Medications, Staying Here or Following up with a Psychiatrist.  8/22 patient reports he is doing well. He denies problems with his mood, denies SI, HI or  hallucinations. He feels a little lightheaded and states his gait is a little unsteady. He went to court for his IVC hearing yesterday. This was uneventful. He is hoping to be discharged tomorrow.    Per Nursing Patient is alert and oriented to person, place and time. Skin is warm, dry and intact. No limitations to all four extremities noted. Patient denies SI/HI, AVH at this time. Patient was observed ambulating in hall during the shift with a steady gait. Patient is compliant with mediations and attends meals with peer interaction noted. Patient went to court this morning for his competency hearing, transported by [facility], returned to the unit without any issues. Milieu remains therapeutic. Patient will be monitored and physician notified of any acute changes.  Principal Problem: Schizoaffective disorder, bipolar type ([place]) Diagnosis:   Patient Active Problem List   Diagnosis Date Noted  . Schizoaffective disorder, bipolar type ([place]) [F25.0] 01/24/2017  . Tobacco use disorder [F17.200] 11/29/2016   Total Time spent with patient: 30 minutes  Past Psychiatric History: Patient was seeing a psychiatrist at [facility] He has been treated in the past with Prolixin  injectable   Past Medical History:  Past Medical History:  Diagnosis Date  . Schizophrenia ([place])    History reviewed. No pertinent surgical history.  Family History: History reviewed. No pertinent family history.   Family Psychiatric  History: Unknown  Social History:  History  Alcohol Use No     History  Drug Use No    Social History   Social History  . Marital status: Single    Spouse name: N/A  . Number of children: N/A  . Years of education: N/A   Social History Main Topics  . Smoking status: Current Every Day Smoker    Packs/day: 1.00    Years: 15.00  . Smokeless tobacco: Never Used  . Alcohol use No  . Drug use: No  . Sexual activity: No   Other Topics Concern  . None   Social  History Narrative  . None     Current Medications: Current Facility-Administered Medications  Medication Dose Route Frequency Provider Last Rate Last Dose  . acetaminophen (TYLENOL) tablet 650 mg  650 mg Oral Q6H PRN [name], [name], MD   650 mg at 01/21/17 2026  . alum & mag hydroxide-simeth (MAALOX/MYLANTA) 200-200-20 MG/5ML suspension 30 mL  30 mL Oral Q4H PRN [name], [name], MD      . ARIPiprazole (ABILIFY) tablet 15 mg  15 mg Oral Daily Hildred Priest, MD   15 mg at 01/24/17 0824  . clonazePAM (KLONOPIN) tablet 1 mg  1 mg Oral QHS Hildred Priest, MD   1 mg at 01/23/17 2200  . divalproex (DEPAKOTE) DR tablet 500 mg  500 mg Oral Q12H Hildred Priest, MD   500 mg at 01/24/17 0824  . [START ON 01/25/2017] fluPHENAZine decanoate (PROLIXIN) injection 50 mg  50 mg Intramuscular Q14 Days Hildred Priest, MD      . hydrocortisone cream 1 %   Topical TID Hildred Priest, MD      . magnesium hydroxide (MILK OF MAGNESIA) suspension 30 mL  30 mL Oral Daily PRN [name], [name], MD   30 mL at 01/21/17 1701  . nicotine (NICODERM CQ - dosed in mg/24 hours) patch 21 mg  21 mg Transdermal Daily Hernandez-Gonzalez, Seth Bake, MD      . polyethylene glycol (MIRALAX / GLYCOLAX) packet 17 g  17 g Oral Daily Hildred Priest, MD   17 g at 01/24/17 7829    Lab Results:  No results found for this or any previous visit (from the past 48 hour(s)).  Blood Alcohol level:  Lab Results  Component Value Date   ETH <5 01/11/2017   ETH <5 56/21/3086    Metabolic Disorder Labs: Lab Results  Component Value Date   HGBA1C 5.3 01/12/2017   MPG 105.41 01/12/2017   MPG 114 12/13/2016   No results found for: PROLACTIN Lab Results  Component Value Date   CHOL 149 01/12/2017   TRIG 48 01/12/2017   HDL 51 01/12/2017   CHOLHDL 2.9 01/12/2017   VLDL 10 01/12/2017   LDLCALC 88 01/12/2017   LDLCALC 65 12/13/2016    Physical Findings: AIMS: Facial  and Oral Movements Muscles of Facial Expression: None, normal Lips and Perioral Area: None, normal Jaw: None, normal Tongue: None, normal,Extremity Movements Upper (arms, wrists, hands, fingers): None, normal Lower (legs, knees, ankles, toes): None, normal, Trunk Movements Neck, shoulders, hips: None, normal, Overall Severity Severity of abnormal movements (highest score from questions above): None, normal Incapacitation due to abnormal movements: None, normal Patient's awareness of abnormal movements (  rate only patient's report): No Awareness, Dental Status Current problems with teeth and/or dentures?: No Does patient usually wear dentures?: Yes  CIWA:    COWS:  COWS Total Score: 0  Musculoskeletal: Strength & Muscle Tone: within normal limits Gait & Station: normal Patient leans: N/A  Psychiatric Specialty Exam: Physical Exam  Nursing note and vitals reviewed. Constitutional: He is oriented to person, place, and time. He appears well-developed and well-nourished.  HENT:  Head: Normocephalic and atraumatic.  Eyes: EOM are normal.  Neck: Normal range of motion.  Cardiovascular: Regular rhythm.   Respiratory: Effort normal. No respiratory distress.  Musculoskeletal: Normal range of motion.  Neurological: He is alert and oriented to person, place, and time.  Psychiatric: His speech is normal. Judgment normal. He expresses no homicidal and no suicidal ideation. He expresses no suicidal plans and no homicidal plans.    Review of Systems  Constitutional: Negative.   HENT: Negative.   Eyes: Negative.   Respiratory: Negative.   Cardiovascular: Negative.   Gastrointestinal: Negative.   Genitourinary: Negative.   Musculoskeletal: Negative.   Skin: Negative.   Neurological: Negative.   Endo/Heme/Allergies: Negative.   Psychiatric/Behavioral: Negative for depression, hallucinations, memory loss, substance abuse and suicidal ideas. The patient is not nervous/anxious and does not  have insomnia.     Blood pressure (!) 99/53, pulse (!) 53, temperature 98.3 F (36.8 C), temperature source Oral, resp. rate 20, height 5\' 9"  (1.753 m), weight 72.3 kg (159 lb 6.4 oz), SpO2 100 %.Body mass index is 23.54 kg/m.  General Appearance: Casual  Eye Contact:  Fair  Speech:  Garbled  Volume:  Normal  Mood:  Euthymic  Affect:  Appropriate and Congruent  Thought Process:  Linear and Descriptions of Associations: Loose  Orientation:  Full (Time, Place, and Person)  Thought Content:  Hallucinations: None  Suicidal Thoughts:  No  Homicidal Thoughts:  No  Memory:  Immediate;   Poor Recent;   Poor Remote;   Poor  Judgement:  Impaired  Insight:  Lacking  Psychomotor Activity:  Increased  Concentration:  Concentration: Fair and Attention Span: Fair  Recall:  Poor  Fund of Knowledge:  Poor  Language:  Fair  Akathisia:  No  Handed:  Right  AIMS (if indicated):     Assets:  Communication Skills  ADL's:  Intact  Cognition:  Impaired,  Mild  Sleep:  Number of Hours: 6.75     Treatment Plan Summary: Daily contact with patient to assess and evaluate symptoms and progress in treatment and Medication management   A 61 year old male with schizophrenia. Apparently the patient has encountered greatest difficulties in order to receive his long acting injectable medication. Per [facility] collateral information the patient has not been receiving the injection at [name] because they didn't have a nurse. Apparently [facility] is unable to see him due to his insurance. D/c on 7/11 on prolixin dec and likely has not received the inj since discharge  Schizoaffective: received prolixin dec 50 mg on 8/9 while in the ER. Next dose on 8/23  Pt was refusing oral medications upon admission. Ordered  non emergency forced medications on 8/10.   He has been taking orally since Friday---Improving  Oral Prolixin has been d/c due to hypotension.  He is due for inj of prolixin tomorrow.  Due to refractory  psychosis  a second antipsychotic was added.  Now on abilify 15 mg q day  Patient will be receiving 50 mg of Prolixin IM. Probably this injection can be given  to him every 3 weeks instead of every 2 weeks.     Mood sx: Continue Depakote 500 mg po BID---level on 8/16 was 76  Tobacco use d/o: Ordered nicotine patch 21 mg  Agitation: not agitated today.  No major incidents since Friday  Insomnia: continue klonopin 1 mg po qhs  Constipation: continue miralax daily.  Pt is also c/o hemorrhoids prep H ordered tid  Diet regular, encourage fluids  Precautions: Fall precautions, every 15 minute checks  Hospitalization status: involuntary commitment  Collateral information is needed from patient's family.  Pt is in need of ACT---[facility] Gwendalyn Ege has agreed to f/u with pt  Will discharge after his second inj tomorrow am  EKG wnl  Depakote level on 8/16 was 53  SW advised the brother to pursue guardianship   Hildred Priest, MD 01/24/2017, 9:45 AM

## 2017-01-24 NOTE — Plan of Care (Signed)
Problem: Coping: Goal: Ability to verbalize feelings will improve Outcome: Progressing Patient is pleasant and cooperative.  Discussing discharge plans and stating that he is ready to go home.

## 2017-01-24 NOTE — [facility] Group Notes (Signed)
  [facility] LCSW Group Therapy Note  Date/Time: 01/24/17, 1300  Type of Therapy/Topic:  Group Therapy:  Emotion Regulation  Participation Level:  Minimal   Mood: pleasant  Description of Group:    The purpose of this group is to assist patients in learning to regulate negative emotions and experience positive emotions. Patients will be guided to discuss ways in which they have been vulnerable to their negative emotions. These vulnerabilities will be juxtaposed with experiences of positive emotions or situations, and patients challenged to use positive emotions to combat negative ones. Special emphasis will be placed on coping with negative emotions in conflict situations, and patients will process healthy conflict resolution skills.  Therapeutic Goals: 1. Patient will identify two positive emotions or experiences to reflect on in order to balance out negative emotions:  2. Patient will label two or more emotions that they find the most difficult to experience:  3. Patient will be able to demonstrate positive conflict resolution skills through discussion or role plays:   Summary of Patient Progress: Pt did not share much in today's group but did share that sadness is the most difficult emotion for him to experience.  Pt also shared that spending time talking to a friend or reading his Bible are ways he can keep his emotions regulated.  Quiet but appropriate comments from pt today.       Therapeutic Modalities:   Cognitive Behavioral Therapy Feelings Identification Dialectical Behavioral Therapy  Lurline Idol, LCSW

## 2017-01-24 NOTE — [facility] Suicide Risk Assessment (Signed)
[facility] Discharge Suicide Risk Assessment   Principal Problem: Schizoaffective disorder, bipolar type [facility]) Discharge Diagnoses:  Patient Active Problem List   Diagnosis Date Noted  . Schizoaffective disorder, bipolar type ([place]) [F25.0] 01/24/2017  . Tobacco use disorder [F17.200] 11/29/2016      Psychiatric Specialty Exam: ROS  Blood pressure (!) 99/53, pulse (!) 53, temperature 98.3 F (36.8 C), temperature source Oral, resp. rate 20, height 5\' 9"  (1.753 m), weight 72.3 kg (159 lb 6.4 oz), SpO2 100 %.Body mass index is 23.54 kg/m.                                                       Mental Status Per Nursing Assessment::   On Admission:     Demographic Factors:  Male  Loss Factors: Decrease in vocational status  Historical Factors: Impulsivity  Risk Reduction Factors:   Sense of responsibility to family, Living with another person, especially a relative and Positive social support  Continued Clinical Symptoms:  Schizophrenia:   Paranoid or undifferentiated type Previous Psychiatric Diagnoses and Treatments  Cognitive Features That Contribute To Risk:  Loss of executive function    Suicide Risk:  Minimal: No identifiable suicidal ideation.  Patients presenting with no risk factors but with morbid ruminations; may be classified as minimal risk based on the severity of the depressive symptoms   Hildred Priest, MD 01/24/2017, 11:47 AM

## 2017-01-24 NOTE — Discharge Summary (Signed)
Physician Discharge Summary Note  Patient:  Dustin [name] is an 61 y.o., male MRN:  [id number] DOB:  January 14, 1956 Patient phone:  [phone] (home)  Patient address:   [address] [place] [address],  Total Time spent with patient: 30 minutes  Date of Admission:  01/11/2017 Date of Discharge: 01/25/17  Reason for Admission:  psychosis  Principal Problem: Schizoaffective disorder, bipolar type [facility]) Discharge Diagnoses: Patient Active Problem List   Diagnosis Date Noted  . Schizoaffective disorder, bipolar type ([place]) [F25.0] 01/24/2017  . Tobacco use disorder [F17.200] 11/29/2016   History of Present Illness:   Dustin [name] an 61 y.o.male with Schizophreniapresented to our ER under IVC on 8/9. Pt was agitated, loud, uncooperative, paranoid and verbally aggressive. Per petition "trying to insight police to kill him".  According to patient he was at home when police came and entered his home. He does not know why they came. He makes repetitive religious references and reports that his aunt is working with a devil Dr. He endorses recent auditory hallucinations but denies any currently. Denies SI HI or visual hallucinations. Denies taking any antipsychotic or mood stabilizing medications.  Discharge from our unit on 7/11.  At that time he was disorganized and delusional. Place on forced meds.  Finally stabilized on prolixin po and decanoate. He was discharged back to his brother's house.    I imagine he did not f/u or take any meds after discharge.    Per nursing staff: "Patient appears irritable and angry using profanities, affect is blunted, patient was not calm during admission process, he kept using vulgar words and sexual explicit terms and was often told to be mindful of such words. Patient denies SI/AVH but noted responding to internal stimuli. Patient's thoughts are disorganized, speech is pressured and tangential."   Trauma:  unknown  Substance abuse denies. Urine toxicology negative. Alcohol below the detection limit. Patient is a heavy smoker.  Associated Signs/Symptoms: Depression Symptoms:  insomnia, psychomotor agitation, anxiety, (Hypo) Manic Symptoms:  Delusions, Distractibility, Hallucinations, Impulsivity, Irritable Mood, Anxiety Symptoms:  Excessive Worry, Psychotic Symptoms:  Delusions, Hallucinations: Auditory Paranoia, PTSD Symptoms: NA Total Time spent with patient: 1 hour  Past Psychiatric History: Patient was seeing a psychiatrist at [facility] He has been treated in the past with Prolixin injectable   Past Medical History:  Past Medical History:  Diagnosis Date  . Schizophrenia ([place])    History reviewed. No pertinent surgical history.  Family History: History reviewed. No pertinent family history.  Family Psychiatric  History: not known  Social History:  History  Alcohol Use No     History  Drug Use No    Social History   Social History  . Marital status: Single    Spouse name: Dustin [name]  . Number of children: Dustin [name]  . Years of education: Dustin [name]   Social History Main Topics  . Smoking status: Current Every Day Smoker    Packs/day: 1.00    Years: 15.00  . Smokeless tobacco: Never Used  . Alcohol use No  . Drug use: No  . Sexual activity: No   Other Topics Concern  . None   Social History Narrative  . None    Hospital Course:    A 61 year old male with schizophrenia. Apparently the patient has encountered greatest difficulties in order to receive his long acting injectable medication. Per [facility] collateral information the patient has not been receiving the injection at [place] because they  didn't have a nurse. Apparently [facility] is unable to see him due to his insurance. D/c on 7/11 on prolixin dec and likely has not received the inj since discharge  Schizoaffective bipolar type: Paranoid, disorganized delusional  Patient received Prolixin decanoate 50 mg on  August 9. And Prolixin decanoate 50 mg on August 23.  As patient has received 2 doses of Prolixin injection the oral dose has been discontinued.  Patient had some trouble tolerating oral dose of Prolixin in peer with a dose of 5 mg twice a day patient was experiencing orthostatic hypotension. Rarely dated Prolixin was tapered off but as he continued to have mood and psychotic symptoms a second antipsychotic was added. Patient has been started on Abilify 15 mg a day  During this hospitalization none emergency forced medications were order. Luckily patient started taking medications orally and did not require any injectable.   Mood sx: Continue Depakote 500 mg po BID---level on 8/16 was 76--- hypersexual, hyperverbal with psychomotor agitation.  Tardive dyskinesia: Patient has significant tardive dyskinesia due to long history of antipsychotic use over the years. In the future treatment with an atypical antipsychotic by itself my need to be considered especially if movement disorder worsens.  Tobacco use disorder: Patient received a nicotine patch during his hospital stay  Insomnia: Patient will be discharged on Ambien 10 mg by mouth as needed for insomnia, 15 tablets prescribed  Constipation: continue miralax daily.  Pt is also c/o hemorrhoids prep H ordered tid  EKG wnl  Depakote level on 8/16 was 47  SW advised the brother to pursue guardianship  Patient has been connected with [name] through [employer]  Patient has improved significantly. Hygiene and grooming are good. The patient is pleasant, calm and cooperative. The patient has been attending group. He is no longer displaying psychomotor agitation, irritability or hypersexuality.  Appears to be at baseline. Staff working with the patient also feels he is much improved. They do not have any concerns about his safety upon discharge.  Physical Findings: AIMS: Facial and Oral Movements Muscles of Facial  Expression: None, normal Lips and Perioral Area: None, normal Jaw: None, normal Tongue: None, normal,Extremity Movements Upper (arms, wrists, hands, fingers): None, normal Lower (legs, knees, ankles, toes): None, normal, Trunk Movements Neck, shoulders, hips: None, normal, Overall Severity Severity of abnormal movements (highest score from questions above): None, normal Incapacitation due to abnormal movements: None, normal Patient's awareness of abnormal movements (rate only patient's report): No Awareness, Dental Status Current problems with teeth and/or dentures?: No Does patient usually wear dentures?: Yes  CIWA:    COWS:  COWS Total Score: 0  Musculoskeletal: Strength & Muscle Tone: within normal limits Gait & Station: unsteady Patient leans: Dustin [name]  Psychiatric Specialty Exam: Physical Exam  Constitutional: He is oriented to person, place, and time. He appears well-developed and well-nourished.  HENT:  Head: Normocephalic and atraumatic.  Eyes: Conjunctivae and EOM are normal.  Neck: Normal range of motion.  Respiratory: Effort normal.  Musculoskeletal: Normal range of motion.  Neurological: He is alert and oriented to person, place, and time.    Review of Systems  Constitutional: Negative.   HENT: Negative.   Eyes: Negative.   Respiratory: Negative.   Cardiovascular: Negative.   Gastrointestinal: Negative.   Genitourinary: Negative.   Musculoskeletal: Negative.   Skin: Negative.   Neurological: Negative.   Endo/Heme/Allergies: Negative.   Psychiatric/Behavioral: Negative.     Blood pressure (!) 99/53, pulse (!) 53, temperature 98.3 F (  36.8 C), temperature source Oral, resp. rate 20, height 5\' 9"  (1.753 m), weight 72.3 kg (159 lb 6.4 oz), SpO2 100 %.Body mass index is 23.54 kg/m.  General Appearance: Well Groomed  Eye Contact:  Good  Speech:  Clear and Coherent  Volume:  Normal  Mood:  Euthymic  Affect:  Appropriate and Congruent  Thought Process:  Linear and  Descriptions of Associations: Intact  Orientation:  Full (Time, Place, and Person)  Thought Content:  Hallucinations: None  Suicidal Thoughts:  No  Homicidal Thoughts:  No  Memory:  Immediate;   Fair Recent;   Fair Remote;   Fair  Judgement:  Poor  Insight:  Shallow  Psychomotor Activity:  Normal  Concentration:  Concentration: Fair and Attention Span: Fair  Recall:  [employer] of Knowledge:  Fair  Language:  Good  Akathisia:  No  Handed:    AIMS (if indicated):     Assets:  Communication Skills Social Support  ADL's:  Intact  Cognition:  WNL  Sleep:  Number of Hours: 6.75        Has this patient used any form of tobacco in the last 30 days? (Cigarettes, Smokeless Tobacco, Cigars, and/or Pipes) Yes, Yes, A prescription for an FDA-approved tobacco cessation medication was offered at discharge and the patient refused  Blood Alcohol level:  Lab Results  Component Value Date   [facility] <5 01/11/2017   ETH <5 19/14/7829    Metabolic Disorder Labs:  Lab Results  Component Value Date   HGBA1C 5.3 01/12/2017   MPG 105.41 01/12/2017   MPG 114 12/13/2016   No results found for: PROLACTIN Lab Results  Component Value Date   CHOL 149 01/12/2017   TRIG 48 01/12/2017   HDL 51 01/12/2017   CHOLHDL 2.9 01/12/2017   VLDL 10 01/12/2017   LDLCALC 88 01/12/2017   LDLCALC 65 12/13/2016   Results for Masaki, SOMNANG MAHAN (MRN [id number]) as of 01/24/2017 11:59  Ref. Range 01/11/2017 19:07 01/12/2017 06:53 01/17/2017 11:35 01/17/2017 11:35 01/18/2017 06:38  COMPREHENSIVE METABOLIC PANEL Unknown Rpt (A)      Sodium Latest Ref Range: 135 - 145 mmol/L 139      Potassium Latest Ref Range: 3.5 - 5.1 mmol/L 3.2 (L)      Chloride Latest Ref Range: 101 - 111 mmol/L 109      CO2 Latest Ref Range: 22 - 32 mmol/L 21 (L)      Glucose Latest Ref Range: 65 - 99 mg/dL 175 (H)      Mean Plasma Glucose Latest Units: mg/dL  105.41     BUN Latest Ref Range: 6 - 20 mg/dL 14      Creatinine Latest Ref Range: 0.61 -  1.24 mg/dL 1.74 (H)      Calcium Latest Ref Range: 8.9 - 10.3 mg/dL 9.2      Anion gap Latest Ref Range: 5 - 15  9      Alkaline Phosphatase Latest Ref Range: 38 - 126 U/L 70      Albumin Latest Ref Range: 3.5 - 5.0 g/dL 4.0      AST Latest Ref Range: 15 - 41 U/L 22      ALT Latest Ref Range: 17 - 63 U/L 12 (L)      Total Protein Latest Ref Range: 6.5 - 8.1 g/dL 7.1      Total Bilirubin Latest Ref Range: 0.3 - 1.2 mg/dL 0.5      GFR, Est African American Latest Ref Range: >60  mL/min 47 (L)      GFR, Est Non African American Latest Ref Range: >60 mL/min 41 (L)      Total CHOL/HDL Ratio Latest Units: RATIO  2.9     Cholesterol Latest Ref Range: 0 - 200 mg/dL  149     HDL Cholesterol Latest Ref Range: >40 mg/dL  51     LDL (calc) Latest Ref Range: 0 - 99 mg/dL  88     Triglycerides Latest Ref Range: <150 mg/dL  48     VLDL Latest Ref Range: 0 - 40 mg/dL  10     WBC Latest Ref Range: 3.8 - 10.6 K/uL 10.4      RBC Latest Ref Range: 4.40 - 5.90 MIL/uL 5.33      Hemoglobin Latest Ref Range: 13.0 - 18.0 g/dL 14.9      HCT Latest Ref Range: 40.0 - 52.0 % 45.0      MCV Latest Ref Range: 80.0 - 100.0 fL 84.4      MCH Latest Ref Range: 26.0 - 34.0 pg 27.9      MCHC Latest Ref Range: 32.0 - 36.0 g/dL 33.1      RDW Latest Ref Range: 11.5 - 14.5 % 14.1      Platelets Latest Ref Range: 150 - 440 K/uL 177      Acetaminophen (Tylenol), S Latest Ref Range: 10 - 30 ug/mL <54 (L)      Salicylate Lvl Latest Ref Range: 2.8 - 30.0 mg/dL <7.0      Valproic Acid,S Latest Ref Range: 50.0 - 100.0 ug/mL     76  Hemoglobin A1C Latest Ref Range: 4.8 - 5.6 %  5.3     TSH Latest Ref Range: 0.350 - 4.500 uIU/mL  1.808     Alcohol, Ethyl (B) Latest Ref Range: <5 mg/dL <5      EKG 12-LEAD Unknown   Rpt Rpt    See Psychiatric Specialty Exam and Suicide Risk Assessment completed by Attending Physician prior to discharge.  Discharge destination:  Home  Is patient on multiple antipsychotic therapies at discharge:   Yes,   Do you recommend tapering to monotherapy for antipsychotics?  No   Has Patient had three or more failed trials of antipsychotic monotherapy by history:  Yes,   Antipsychotic medications that previously failed include:   1.  prolixin.  Recommended Plan for Multiple Antipsychotic Therapies: Additional reason(s) for multiple antispychotic treatment:  poor response and SE to higher dose of prolixin   Allergies as of 01/24/2017   No Known Allergies     Medication List    STOP taking these medications   diphenhydrAMINE 25 mg capsule Commonly known as:  BENADRYL     TAKE these medications     Indication  ARIPiprazole 15 MG tablet Commonly known as:  ABILIFY Take 1 tablet (15 mg total) by mouth daily.  Indication:  schizoaffective   divalproex 500 MG DR tablet Commonly known as:  DEPAKOTE Take 1 tablet (500 mg total) by mouth every 12 (twelve) hours.  Indication:  schizoaffective bipolar type   fluPHENAZine decanoate 25 MG/ML injection Commonly known as:  PROLIXIN Inject 2 mLs (50 mg total) into the muscle every 21 ( twenty-one) days. Start taking on:  02/15/2017 What changed:  when to take this  These instructions start on 02/15/2017. If you are unsure what to do until then, ask your doctor or other care provider.  Indication:  schizoaffective/due Sep 13   zolpidem 10 MG tablet  Commonly known as:  AMBIEN Take 1 tablet (10 mg total) by mouth at bedtime as needed for sleep. What changed:  when to take this  reasons to take this  Indication:  Trouble Sleeping       >30 minutes. >50 % of the time was spent in coordination of care  Signed: Hildred Priest, MD 01/24/2017, 11:49 AM

## 2017-01-25 NOTE — BHH Group Notes (Signed)
Goals Group Date/Time: 01/25/2017 9:00 AM Type of Therapy and Topic: Group Therapy: Goals Group: SMART Goals   Participation Level: Moderate  Description of Group:    The purpose of a daily goals group is to assist and guide patients in setting recovery/wellness-related goals. The objective is to set goals as they relate to the crisis in which they were admitted. Patients will be using SMART goal modalities to set measurable goals. Characteristics of realistic goals will be discussed and patients will be assisted in setting and processing how one will reach their goal. Facilitator will also assist patients in applying interventions and coping skills learned in psycho-education groups to the SMART goal and process how one will achieve defined goal.   Therapeutic Goals:   -Patients will develop and document one goal related to or their crisis in which brought them into treatment.  -Patients will be guided by LCSW using SMART goal setting modality in how to set a measurable, attainable, realistic and time sensitive goal.  -Patients will process barriers in reaching goal.  -Patients will process interventions in how to overcome and successful in reaching goal.   Patient's Goal:Pt goal today, after his discharge, is to get a haircut, shower, and reconnect with people outside.   Therapeutic Modalities:  Motivational Interviewing  Art gallery manager  SMART goals setting   Lurline Idol, [place]

## 2017-01-25 NOTE — Progress Notes (Signed)
DAR Note: Pt observed interacting with peers and staff on the hallway. Pt at the time of assessment denied any anxiety, depression, pain, SI, HI or AVH; state, "I am ready to home; I feel good." Pt expressed some delusion however; remained calm and cooperative. Medications offered as prescribed. All patient's questions and concerns addressed. Support, encouragement, and safe environment provided. 15-minute safety checks continue. Pt was med compliant. Safety checks continue.

## 2017-01-25 NOTE — Progress Notes (Signed)
  [facility] Adult Case Management Discharge Plan :  Will you be returning to the same living situation after discharge:  Yes,  returning home. At discharge, do you have transportation home?: Yes,  brother will pick them up. Do you have the ability to pay for your medications: Yes,  Medicare.  Release of information consent forms completed and in the chart;  Patient's signature needed at discharge.  Patient to Follow up at: Follow-up [facility]. Go on 01/29/2017.   Why:  Please follow-up with [name] on Monday, August 27th at 10:30AM. Please arrive to office on time for your initial assessment with Thayer Headings. If you have questions or concerns, contact office directly.  Contact information: [place] [address] [phone]           Next level of care provider has access to [place] and Suicide Prevention discussed: Yes,  SPE completed with patient's brother.     Has patient been referred to the Quitline?: Patient refused referral  Patient has been referred for addiction treatment: N/A  Emilie Rutter, [place] 01/25/2017, 11:31 AM

## 2017-01-25 NOTE — Progress Notes (Signed)
Patient denies SI/HI, denies A/V hallucinations. Patient verbalizes understanding of discharge instructions, follow up care and prescriptions. Patient given all belongings from  locker. Patient escorted out by staff, transported by family. 

## 2017-01-25 NOTE — Progress Notes (Signed)
Recreation Therapy Notes  Date: 08.23.18 Time: 9:30 am Location: Craft Room  Group Topic: Leisure Education  Goal Area(s) Addresses:  Patient will identify things they are grateful for. Patient will identify how being grateful can influence decision making.  Behavioral Response: Attentive  Intervention: Immunologist  Activity: Patients were given an I Am Grateful For worksheet and were instructed to write things they are grateful for under each category.  Education: LRT educated patients on leisure.  Education Outcome: In group clarification offered  Clinical Observations/Feedback: Patient worked on Radio producer. Patient did not contribute to group discussion.  Leonette Monarch, LRT/CTRS 01/25/2017 10:10 AM

## 2017-03-15 ENCOUNTER — Other Ambulatory Visit (HOSPITAL_COMMUNITY): Payer: Self-pay | Admitting: Psychiatry

## 2017-08-15 ENCOUNTER — Emergency Department
Admission: EM | Admit: 2017-08-15 | Discharge: 2017-08-16 | Disposition: A | Discharge status: Other Acute Inpatient Hospital | Payer: Medicare Other | Attending: Emergency Medicine | Admitting: Emergency Medicine

## 2017-08-15 ENCOUNTER — Encounter: Payer: Self-pay | Admitting: Emergency Medicine

## 2017-08-15 DIAGNOSIS — F29 Unspecified psychosis not due to a substance or known physiological condition: Secondary | ICD-10-CM | POA: Insufficient documentation

## 2017-08-15 DIAGNOSIS — F17210 Nicotine dependence, cigarettes, uncomplicated: Secondary | ICD-10-CM | POA: Diagnosis not present

## 2017-08-15 DIAGNOSIS — F250 Schizoaffective disorder, bipolar type: Secondary | ICD-10-CM

## 2017-08-15 DIAGNOSIS — Z79899 Other long term (current) drug therapy: Secondary | ICD-10-CM | POA: Insufficient documentation

## 2017-08-15 DIAGNOSIS — F17200 Nicotine dependence, unspecified, uncomplicated: Secondary | ICD-10-CM | POA: Diagnosis present

## 2017-08-15 DIAGNOSIS — G2401 Drug induced subacute dyskinesia: Secondary | ICD-10-CM | POA: Diagnosis present

## 2017-08-15 LAB — URINE DRUG SCREEN, QUALITATIVE (ARMC ONLY)
AMPHETAMINES, UR SCREEN: NOT DETECTED
BARBITURATES, UR SCREEN: NOT DETECTED
BENZODIAZEPINE, UR SCRN: NOT DETECTED
COCAINE METABOLITE, UR ~~LOC~~: NOT DETECTED
Cannabinoid 50 Ng, Ur ~~LOC~~: NOT DETECTED
MDMA (Ecstasy)Ur Screen: NOT DETECTED
METHADONE SCREEN, URINE: NOT DETECTED
OPIATE, UR SCREEN: NOT DETECTED
Phencyclidine (PCP) Ur S: NOT DETECTED
TRICYCLIC, UR SCREEN: NOT DETECTED

## 2017-08-15 LAB — COMPREHENSIVE METABOLIC PANEL
ALBUMIN: 4.1 g/dL (ref 3.5–5.0)
ALT: 12 U/L — ABNORMAL LOW (ref 17–63)
ANION GAP: 9 (ref 5–15)
AST: 23 U/L (ref 15–41)
Alkaline Phosphatase: 84 U/L (ref 38–126)
BUN: 12 mg/dL (ref 6–20)
CALCIUM: 9.3 mg/dL (ref 8.9–10.3)
CO2: 22 mmol/L (ref 22–32)
Chloride: 106 mmol/L (ref 101–111)
Creatinine, Ser: 1.29 mg/dL — ABNORMAL HIGH (ref 0.61–1.24)
GFR calc non Af Amer: 58 mL/min — ABNORMAL LOW (ref >60)
GLUCOSE: 195 mg/dL — AB (ref 65–99)
POTASSIUM: 3.7 mmol/L (ref 3.5–5.1)
SODIUM: 137 mmol/L (ref 135–145)
Total Bilirubin: 0.6 mg/dL (ref 0.3–1.2)
Total Protein: 7.2 g/dL (ref 6.5–8.1)

## 2017-08-15 LAB — CBC
HEMATOCRIT: 47.4 % (ref 40.0–52.0)
HEMOGLOBIN: 15.5 g/dL (ref 13.0–18.0)
MCH: 27.5 pg (ref 26.0–34.0)
MCHC: 32.7 g/dL (ref 32.0–36.0)
MCV: 84.1 fL (ref 80.0–100.0)
Platelets: 185 10*3/uL (ref 150–440)
RBC: 5.64 MIL/uL (ref 4.40–5.90)
RDW: 13.8 % (ref 11.5–14.5)
WBC: 8.2 10*3/uL (ref 3.8–10.6)

## 2017-08-15 LAB — ACETAMINOPHEN LEVEL

## 2017-08-15 LAB — SALICYLATE LEVEL

## 2017-08-15 LAB — ETHANOL: Alcohol, Ethyl (B): <10 mg/dL (ref <10)

## 2017-08-15 MED ORDER — NICOTINE 21 MG/24HR TD PT24
21.0000 mg | MEDICATED_PATCH | Freq: Every day | TRANSDERMAL | Status: DC
  Filled 2017-08-15 (×2): qty 1

## 2017-08-15 MED ORDER — ARIPIPRAZOLE 10 MG PO TABS
15.0000 mg | ORAL_TABLET | Freq: Every day | ORAL | Status: DC
  Administered 2017-08-15 – 2017-08-16 (×2): 15 mg via ORAL
  Filled 2017-08-15 (×3): qty 1

## 2017-08-15 NOTE — ED Notes (Signed)

## 2017-08-15 NOTE — ED Notes (Signed)
Pt. To BHU from ED ambulatory without difficulty, to room  BHU 5. Report from [facility]. Pt. Is alert and oriented, warm and dry in no distress. Pt. Denies SI, HI, and AVH. Patient wanted door open to room because by shutting door would trap the spirits. Pt. Calm and cooperative. Pt. Made aware of security cameras and Q15 minute rounds. Pt. Encouraged to let Nursing staff know of any concerns or needs.

## 2017-08-15 NOTE — ED Provider Notes (Signed)
[facility] Emergency Department Provider Note  ____________________________________________   First MD Initiated Contact with Patient 08/15/17 1635     (approximate)  I have reviewed the triage vital signs and the nursing notes.   HISTORY  Chief Complaint Psychiatric Evaluation   HPI Dustin [name] is a 62 y.o. male with a history of schizophrenia who is presenting to the emergency department today under involuntary commitment for being agitated, threatening violence and rape as well as psychosis and hyperreligiosity.  Patient denies this to me.  However, he does make bizarre statements, saying that he has a "Mirena implant" in his teeth and that he cannot hear anything but senses sound through the front of his face.  Patient appears to be hearing me just fine and is responding appropriately during the interaction.   Past Medical History:  Diagnosis Date  . Schizophrenia [facility])     Patient Active Problem List   Diagnosis Date Noted  . Schizoaffective disorder, bipolar type ([place]) 01/24/2017  . Tardive dyskinesia 01/24/2017  . Tobacco use disorder 11/29/2016    History reviewed. No pertinent surgical history.  Prior to Admission medications   Medication Sig Start Date End Date Taking? Authorizing Provider  ARIPiprazole (ABILIFY) 15 MG tablet Take 1 tablet (15 mg total) by mouth daily. 01/25/17   Hildred Priest, MD  divalproex (DEPAKOTE) 500 MG DR tablet Take 1 tablet (500 mg total) by mouth every 12 (twelve) hours. 01/24/17   Hildred Priest, MD  fluPHENAZine decanoate (PROLIXIN) 25 MG/ML injection Inject 2 mLs (50 mg total) into the muscle every 21 ( twenty-one) days. 02/15/17   Hildred Priest, MD  zolpidem (AMBIEN) 10 MG tablet Take 1 tablet (10 mg total) by mouth at bedtime as needed for sleep. 01/24/17   Hildred Priest, MD    Allergies Patient has no known allergies.  No family history on file.  Social  History Social History   Tobacco Use  . Smoking status: Current Every Day Smoker    Packs/day: 1.00    Years: 15.00    Pack years: 15.00  . Smokeless tobacco: Never Used  Substance Use Topics  . Alcohol use: No  . Drug use: No    Review of Systems  Constitutional: No fever/chills Eyes: No visual changes. ENT: No sore throat. Cardiovascular: Denies chest pain. Respiratory: Denies shortness of breath. Gastrointestinal: No abdominal pain.  No nausea, no vomiting.  No diarrhea.  No constipation. Genitourinary: Negative for dysuria. Musculoskeletal: Negative for back pain. Skin: Negative for rash. Neurological: Negative for headaches, focal weakness or numbness.   ____________________________________________   PHYSICAL EXAM:  VITAL SIGNS: ED Triage Vitals [08/15/17 1620]  Enc Vitals Group     BP (!) 159/112     Pulse Rate (!) 124     Resp 18     Temp 98.2 F (36.8 C)     Temp Source Oral     SpO2 94 %     Weight 160 lb (72.6 kg)     Height 6\' 1"  (1.854 m)     Head Circumference      Peak Flow      Pain Score      Pain Loc      Pain Edu?      Excl. in [place]?     Constitutional: Alert and oriented. Well appearing and in no acute distress. Eyes: Conjunctivae are normal.  Head: Atraumatic. Nose: No congestion/rhinnorhea. Mouth/Throat: Mucous membranes are moist.  Neck: No stridor.   Cardiovascular:  Normal rate, regular rhythm. Grossly normal heart sounds.  Respiratory: Normal respiratory effort.  No retractions. Lungs CTAB. Gastrointestinal: Soft and nontender. No distention.  Musculoskeletal: No lower extremity tenderness nor edema.  No joint effusions. Neurologic:  Normal speech and language. No gross focal neurologic deficits are appreciated. Skin:  Skin is warm, dry and intact. No rash noted. Psychiatric: Odd affect  ____________________________________________   LABS (all labs ordered are listed, but only abnormal results are displayed)  Labs Reviewed    COMPREHENSIVE METABOLIC PANEL - Abnormal; Notable for the following components:      Result Value   Glucose, Bld 195 (*)    Creatinine, Ser 1.29 (*)    ALT 12 (*)    GFR calc non Af Amer 58 (*)    All other components within normal limits  ACETAMINOPHEN LEVEL - Abnormal; Notable for the following components:   Acetaminophen (Tylenol), Serum <10 (*)    All other components within normal limits  ETHANOL  SALICYLATE LEVEL  CBC  URINE DRUG SCREEN, QUALITATIVE ([facility] ONLY)   ____________________________________________  EKG   ____________________________________________  RADIOLOGY   ____________________________________________   PROCEDURES  Procedure(s) performed:   Procedures  Critical Care performed:   ____________________________________________   INITIAL IMPRESSION / ASSESSMENT AND PLAN / ED COURSE  Pertinent labs & imaging results that were available during my care of the patient were reviewed by me and considered in my medical decision making (see chart for details).  DDX: Psychosis, polysubstance abuse, bipolar disorder, schizophrenia As part of my medical decision making, I reviewed the following data within the electronic MEDICAL RECORD NUMBER Notes from prior ED visits    ____________________________________________   FINAL CLINICAL IMPRESSION(S) / ED DIAGNOSES  Final diagnoses:  Psychosis, unspecified psychosis type ([place])      NEW MEDICATIONS STARTED DURING THIS VISIT:  New Prescriptions   No medications on file     Note:  This document was prepared using Dragon voice recognition software and may include unintentional dictation errors.     Orbie Pyo, MD 08/15/17 [phone]

## 2017-08-15 NOTE — ED Triage Notes (Signed)
Pt comes into the ED via Police for IVC.  Patient is here for paranoia, hyper-religious, paranoia, and threatening rape on others.  Patient states he has not been taking medications as prescribed.  Denies any SI or HI at this time.  Patient in NAD with even and unlabored respirations.

## 2017-08-15 NOTE — BH Assessment (Signed)
Assessment Note  Dustin [name] is an 62 y.o. male who presents to the ER via law enforcement due to his ACT Team, placing him under IVC. Per the patient's ACT Team Thayer Headings), the patient has had an increase of paranoia and psychosis. He is writing on the walls at the home and having other odd and bizarre behaviors. He lives with his brother that is disable. Patient is unwilling to allow his ACT Team to make adjustments with his medications and refusing to go to his Doctor's appointments.   Per the report of the patient, he was kidnapped and brought to the ER against his will. He further reports, there isn't anything wrong with him. "In my head, my mind and in my world, I'm doing fine.." He referred to promising his mother, he was going to take care of his brother, because they both "went to the school for the deaf, dumb and blind."  Throughout the interview, he was tangential and psychotic. Majority of his answers were not related to the questions. For example, when asked about his medications, he shared that someone who lives down the street drives a [employer] and he don't suppose to because he was over 62 years old.  Patient denies the use of mind-altering substances and his UDS reflect the same.   Diagnosis: Schizophrenia  Past Medical History:  Past Medical History:  Diagnosis Date  . Schizophrenia ([place])     History reviewed. No pertinent surgical history.  Family History: No family history on file.  Social History:  reports that he has been smoking.  He has a 15.00 pack-year smoking history. he has never used smokeless tobacco. He reports that he does not drink alcohol or use drugs.  Additional Social History:  Alcohol / Drug Use Pain Medications: See PTA Prescriptions: See PTA Over the Counter: See PTA History of alcohol / drug use?: No history of alcohol / drug abuse Longest period of sobriety (when/how long): n/a Negative Consequences of Use: (n/a) Withdrawal Symptoms:  (n/a)  CIWA: CIWA-Ar BP: (!) 159/112 Pulse Rate: (!) 124 COWS:    Allergies: No Known Allergies  Home Medications:  (Not in a hospital admission)  OB/GYN Status:  No LMP for male patient.  General Assessment Data Location of Assessment: [facility] ED TTS Assessment: In system Is this a Tele or Face-to-Face Assessment?: Face-to-Face Is this an Initial Assessment or a Re-assessment for this encounter?: Initial Assessment Marital status: Single Maiden name: n/a Is patient pregnant?: No Pregnancy Status: No Living Arrangements: Other relatives(Live with brother) Can pt return to current living arrangement?: Yes Admission Status: Involuntary Is patient capable of signing voluntary admission?: No(Under IVC) Referral Source: Self/Family/Friend Insurance type: [facility] Medicare  Medical Screening Exam ([place]) Medical Exam completed: Yes  Crisis Care Plan Living Arrangements: Other relatives(Live with brother) Legal Guardian: Other:(Self) Name of Psychiatrist: [place] Name of Therapist: [name]  Education Status Is patient currently in school?: No Is the patient employed, unemployed or receiving disability?: Receiving disability income  Risk to self with the past 6 months Suicidal Ideation: No Has patient been a risk to self within the past 6 months prior to admission? : No Suicidal Intent: No Has patient had any suicidal intent within the past 6 months prior to admission? : No Is patient at risk for suicide?: No Suicidal Plan?: No Has patient had any suicidal plan within the past 6 months prior to admission? : No Access to Means: No What has been your use of  drugs/alcohol within the last 12 months?: Reports of none Previous Attempts/Gestures: No How many times?: 0 Other Self Harm Risks: Reports of none Triggers for Past Attempts: None known Intentional Self Injurious Behavior: None Family Suicide History: No Recent stressful life event(s): Other  (Comment)(Psychosis) Persecutory voices/beliefs?: No Depression: No Depression Symptoms: Isolating Substance abuse history and/or treatment for substance abuse?: No Suicide prevention information given to non-admitted patients: Not applicable  Risk to Others within the past 6 months Homicidal Ideation: No Does patient have any lifetime risk of violence toward others beyond the six months prior to admission? : No Thoughts of Harm to Others: No Current Homicidal Intent: No Current Homicidal Plan: No Access to Homicidal Means: No Identified Victim: Reports of none History of harm to others?: No Assessment of Violence: None Noted Violent Behavior Description: Reports of none Does patient have access to weapons?: No Criminal Charges Pending?: No Does patient have a court date: No Is patient on probation?: No  Psychosis Hallucinations: None noted Delusions: Grandiose, Persecutory  Mental Status Report Appearance/Hygiene: Unremarkable, In scrubs Eye Contact: Good Motor Activity: Freedom of movement, Unremarkable Speech: Tangential, Argumentative Level of Consciousness: Alert, Irritable Mood: Anxious, Irritable, Preoccupied Affect: Fearful, Irritable, Anxious Anxiety Level: Minimal Thought Processes: Flight of Ideas, Tangential Judgement: Impaired Orientation: Person, Place, Time, Situation, Appropriate for developmental age Obsessive Compulsive Thoughts/Behaviors: Minimal  Cognitive Functioning Concentration: Normal Memory: Recent Intact, Remote Intact Is patient IDD: No Is patient DD?: No Insight: Poor Impulse Control: Poor Appetite: Good Have you had any weight changes? : No Change Sleep: No Change Total Hours of Sleep: 8 Vegetative Symptoms: None  ADLScreening [facility]) Patient's cognitive ability adequate to safely complete daily activities?: Yes Patient able to express need for assistance with ADLs?: Yes Independently performs ADLs?: Yes  (appropriate for developmental age)  Prior Inpatient Therapy Prior Inpatient Therapy: Yes Prior Therapy Dates: 11/2016 Prior Therapy Facilty/Provider(s): [facility] Reason for Treatment: Psychosis  Prior Outpatient Therapy Prior Outpatient Therapy: Yes Prior Therapy Dates: Currently Prior Therapy Facilty/Provider(s): [facility] Reason for Treatment: Psychosis Does patient have an ACCT team?: Yes([facility]) Does patient have Intensive In-House Services?  : No Does patient have [facility] services? : No Does patient have P4CC services?: No  ADL Screening (condition at time of admission) Patient's cognitive ability adequate to safely complete daily activities?: Yes Is the patient deaf or have difficulty hearing?: No Does the patient have difficulty seeing, even when wearing glasses/contacts?: No Does the patient have difficulty concentrating, remembering, or making decisions?: No Patient able to express need for assistance with ADLs?: Yes Does the patient have difficulty dressing or bathing?: No Independently performs ADLs?: Yes (appropriate for developmental age) Does the patient have difficulty walking or climbing stairs?: No Weakness of Legs: None Weakness of Arms/Hands: None  Home Assistive Devices/Equipment Home Assistive Devices/Equipment: None  Therapy Consults (therapy consults require a physician order) PT Evaluation Needed: No OT Evalulation Needed: No SLP Evaluation Needed: No Abuse/Neglect Assessment (Assessment to be complete while patient is alone) Abuse/Neglect Assessment Can Be Completed: Yes Physical Abuse: Denies Verbal Abuse: Denies Sexual Abuse: Denies Exploitation of patient/patient's resources: Denies Self-Neglect: Denies Values / Beliefs Cultural Requests During Hospitalization: None Spiritual Requests During Hospitalization: None Consults Spiritual Care Consult Needed: No Social Work Consult Needed: No Regulatory affairs officer (For  Healthcare) Does Patient Have a Medical Advance Directive?: No Would patient like information on creating a medical advance directive?: No - Patient declined       Child/Adolescent Assessment Running Away  Risk: Denies(Patient is an adult)  Disposition:  Disposition Initial Assessment Completed for this Encounter: Yes  On Site Evaluation by:   Reviewed with Physician:    Gunnar Fusi MS, LCAS, LPC, [place], CCSI Therapeutic Triage Specialist 08/15/2017 6:17 PM

## 2017-08-15 NOTE — ED Notes (Signed)
ED Provider at bedside. 

## 2017-08-15 NOTE — ED Notes (Signed)
IVC 

## 2017-08-15 NOTE — ED Notes (Signed)
Pt. Up and walking to bathroom with steady gait.

## 2017-08-15 NOTE — ED Notes (Signed)
Pt. Awake and watching tv.

## 2017-08-15 NOTE — BH Assessment (Signed)
Per Dr. Weber Cooks, patient meets inpatient criteria. Patient to be admitted to [facility] BMU when bed becomes available, pending discharges.

## 2017-08-15 NOTE — Consult Note (Signed)
[facility] Face-to-Face Psychiatry Consult   Reason for Consult: Consult for this 62 year old man with a past history of chronic mental health problems brought in under IVC Referring Physician: [place] Patient Identification: Dustin [name] MRN:  [id number] Principal Diagnosis: Schizoaffective disorder, bipolar type Dustin [name] - Inpatient) Diagnosis:   Patient Active Problem List   Diagnosis Date Noted  . Schizoaffective disorder, bipolar type (Dustin [name]) [F25.0] 01/24/2017  . Tardive dyskinesia [G24.01] 01/24/2017  . Tobacco use disorder [F17.200] 11/29/2016    Total Time spent with patient: 1 hour  Subjective:   Dustin [name] is a 62 y.o. male patient admitted with "the police broke into my house".  HPI: Patient interviewed.  Chart reviewed.  62 year old man with chronic mental health problems.  He was brought in today after the [facility] psychiatrist filed commitment papers stating that he has been refusing medications agitated making threats including alleging that he had threatened to rape somebody.  On interview today the patient is able to tell me that the police came to his apartment or trailer and brought him into the hospital.  At first he tells me that he knows that the doctor called the police.  Later he claims to not know why the police came.  Patient does not show any insight into what he might of done to trigger the commitment.  He denies that he has been threatening anyone.  He denies any mood symptoms denies suicidal ideation.  Indicates that he had been taking his medication.  Denies any drug use.  Says that he drinks a little bit of alcohol regularly last drink last night.  Patient is very disorganized hyper religious hard to follow much of the time although he can answer simple questions and be redirected.  Social history: Lives by himself in a trailer.  Talks about some family but he is so disorganized it is hard to tell if any of them are actively involved.  Medical history: Past diagnosis of tardive  dyskinesia but no other diagnoses.  His blood sugars a little bit elevated now not sure if that is just situational.  Substance abuse history: Heavy tobacco use.  Patient says he drinks 1 or 2 shots most nights but denies that it is a problem.  Denies any other drug use.  Substance abuse has not been documented as a problem in the past.  Past Psychiatric History: Patient has a history of schizoaffective disorder or schizophrenia.  Has had multiple hospitalizations.  Most recently was at our hospital last August.  He indicates that he does have at least one prior suicide attempt but that it was a long time ago.  It sounds like when he gets psychotic he will get agitated however it is not known if he is really been significantly violent.  Risk to Self: Is patient at risk for suicide?: No Risk to Others:   Prior Inpatient Therapy:   Prior Outpatient Therapy:    Past Medical History:  Past Medical History:  Diagnosis Date  . Schizophrenia ([place])    History reviewed. No pertinent surgical history. Family History: No family history on file. Family Psychiatric  History: None known Social History:  Social History   Substance and Sexual Activity  Alcohol Use No     Social History   Substance and Sexual Activity  Drug Use No    Social History   Socioeconomic History  . Marital status: Single    Spouse name: None  . Number of children: None  . Years of education: None  .  Highest education level: None  Social Needs  . Financial resource strain: None  . Food insecurity - worry: None  . Food insecurity - inability: None  . Transportation needs - medical: None  . Transportation needs - non-medical: None  Occupational History  . None  Tobacco Use  . Smoking status: Current Every Day Smoker    Packs/day: 1.00    Years: 15.00    Pack years: 15.00  . Smokeless tobacco: Never Used  Substance and Sexual Activity  . Alcohol use: No  . Drug use: No  . Sexual activity: No    Birth  control/protection: None  Other Topics Concern  . None  Social History Narrative  . None   Additional Social History:    Allergies:  No Known Allergies  Labs:  Results for orders placed or performed during the hospital encounter of 08/15/17 (from the past 48 hour(s))  Comprehensive metabolic panel     Status: Abnormal   Collection Time: 08/15/17  4:22 PM  Result Value Ref Range   Sodium 137 135 - 145 mmol/L   Potassium 3.7 3.5 - 5.1 mmol/L   Chloride 106 101 - 111 mmol/L   CO2 22 22 - 32 mmol/L   Glucose, Bld 195 (H) 65 - 99 mg/dL   BUN 12 6 - 20 mg/dL   Creatinine, Ser 1.29 (H) 0.61 - 1.24 mg/dL   Calcium 9.3 8.9 - 10.3 mg/dL   Total Protein 7.2 6.5 - 8.1 g/dL   Albumin 4.1 3.5 - 5.0 g/dL   AST 23 15 - 41 U/L   ALT 12 (L) 17 - 63 U/L   Alkaline Phosphatase 84 38 - 126 U/L   Total Bilirubin 0.6 0.3 - 1.2 mg/dL   GFR calc non Af Amer 58 (L) >60 mL/min   GFR calc Af Amer >60 >60 mL/min    Comment: (NOTE) The eGFR has been calculated using the CKD EPI equation. This calculation has not been validated in all clinical situations. eGFR's persistently <60 mL/min signify possible Chronic Kidney Disease.    Anion gap 9 5 - 15    Comment: Performed at [facility] [name], [name] [place], [place] [address]  Ethanol     Status: None   Collection Time: 08/15/17  4:22 PM  Result Value Ref Range   Alcohol, Ethyl (B) <10 <10 mg/dL    Comment:        LOWEST DETECTABLE LIMIT FOR SERUM ALCOHOL IS 10 mg/dL FOR MEDICAL PURPOSES ONLY Performed at [facility], [address] [place], [place] [address]   cbc     Status: None   Collection Time: 08/15/17  4:22 PM  Result Value Ref Range   WBC 8.2 3.8 - 10.6 K/uL   RBC 5.64 4.40 - 5.90 MIL/uL   Hemoglobin 15.5 13.0 - 18.0 g/dL   HCT 47.4 40.0 - 52.0 %   MCV 84.1 80.0 - 100.0 fL   MCH 27.5 26.0 - 34.0 pg   MCHC 32.7 32.0 - 36.0 g/dL   RDW 13.8 11.5 - 14.5 %   Platelets 185 150 - 440 K/uL    Comment: Performed  at [facility], [address], [name], [place] [address]    Current Facility-Administered Medications  Medication Dose Route Frequency Provider Last Rate Last Dose  . ARIPiprazole (ABILIFY) tablet 15 mg  15 mg Oral Daily Andrienne Havener T, MD      . nicotine (NICODERM CQ - dosed in mg/24 hours) patch 21  mg  21 mg Transdermal Daily Jamese Trauger, [name], MD       Current Outpatient Medications  Medication Sig Dispense Refill  . ARIPiprazole (ABILIFY) 15 MG tablet Take 1 tablet (15 mg total) by mouth daily. 30 tablet 0  . divalproex (DEPAKOTE) 500 MG DR tablet Take 1 tablet (500 mg total) by mouth every 12 (twelve) hours. 60 tablet 0  . fluPHENAZine decanoate (PROLIXIN) 25 MG/ML injection Inject 2 mLs (50 mg total) into the muscle every 21 ( twenty-one) days. 2 mL 0  . zolpidem (AMBIEN) 10 MG tablet Take 1 tablet (10 mg total) by mouth at bedtime as needed for sleep. 30 tablet 0    Musculoskeletal: Strength & Muscle Tone: within normal limits Gait & Station: normal Patient leans: N/A  Psychiatric Specialty Exam: Physical Exam  Nursing note and vitals reviewed. Constitutional: He appears well-developed and well-nourished.  HENT:  Head: Normocephalic and atraumatic.  Eyes: Conjunctivae are normal. Pupils are equal, round, and reactive to light.  Neck: Normal range of motion.  Cardiovascular: Regular rhythm and normal heart sounds.  Respiratory: Effort normal. No respiratory distress.  GI: Soft.  Musculoskeletal: Normal range of motion.  Neurological: He is alert.  Skin: Skin is warm and dry.  Psychiatric: His speech is normal. His affect is blunt. He is agitated. He is not aggressive. Thought content is paranoid and delusional. Cognition and memory are impaired. He expresses impulsivity. He expresses no homicidal and no suicidal ideation.    Review of Systems  Constitutional: Negative.   HENT: Negative.   Eyes: Negative.   Respiratory: Negative.   Cardiovascular: Negative.    Gastrointestinal: Negative.   Musculoskeletal: Negative.   Skin: Negative.   Neurological: Negative.   Psychiatric/Behavioral: Positive for hallucinations. Negative for depression, memory loss, substance abuse and suicidal ideas. The patient is not nervous/anxious and does not have insomnia.     Blood pressure (!) 159/112, pulse (!) 124, temperature 98.2 F (36.8 C), temperature source Oral, resp. rate 18, height 6' 1"  (1.854 m), weight 72.6 kg (160 lb), SpO2 94 %.Body mass index is 21.11 kg/m.  General Appearance: Fairly Groomed  Eye Contact:  Good  Speech:  Garbled  Volume:  Increased  Mood:  Euthymic  Affect:  Constricted  Thought Process:  Disorganized  Orientation:  Full (Time, Place, and Person)  Thought Content:  Illogical, Paranoid Ideation, Rumination and Tangential  Suicidal Thoughts:  No  Homicidal Thoughts:  No  Memory:  Immediate;   Fair Recent;   Fair Remote;   Fair  Judgement:  Impaired  Insight:  Shallow  Psychomotor Activity:  Normal  Concentration:  Concentration: Fair  Recall:  [employer] of Knowledge:  Fair  Language:  Fair  Akathisia:  No  Handed:  Right  AIMS (if indicated):     Assets:  Housing Physical Health Resilience Social Support  ADL's:  Intact  Cognition:  Impaired,  Mild  Sleep:        Treatment Plan Summary: Daily contact with patient to assess and evaluate symptoms and progress in treatment, Medication management and Plan 62 year old man with a history of chronic mental health problems.  Patient is disorganized hyper religious somewhat bizarre in his thinking but at least here in the emergency room his behavior has been calm and cooperative so far.  Commitment paperwork alleges that he has been threatening and that he has not been doing well recently.  Patient probably is decompensated and needing hospitalization.  He tells me that he gets  an Abilify shot and he last had it about a month ago.  Since I do not know this for certain I am  not going to give him another injection right now but we will give him oral Abilify.  We can plan on admission to the psychiatric unit although I do not think beds are available at this time.  If he is still in the emergency room tomorrow we will reassess it.  Case reviewed with TTS and emergency room physician.  Disposition: Recommend psychiatric Inpatient admission when medically cleared. Supportive therapy provided about ongoing stressors.  Alethia Berthold, MD 08/15/2017 5:04 PM

## 2017-08-16 ENCOUNTER — Inpatient Hospital Stay
Admission: AD | Admit: 2017-08-16 | Discharge: 2017-08-29 | DRG: 885 | Disposition: A | Discharge status: Home / Self Care | Payer: Medicare Other | Attending: Psychiatry | Admitting: Psychiatry

## 2017-08-16 ENCOUNTER — Other Ambulatory Visit: Payer: Self-pay

## 2017-08-16 DIAGNOSIS — F17200 Nicotine dependence, unspecified, uncomplicated: Secondary | ICD-10-CM | POA: Diagnosis present

## 2017-08-16 DIAGNOSIS — G2401 Drug induced subacute dyskinesia: Secondary | ICD-10-CM | POA: Diagnosis present

## 2017-08-16 DIAGNOSIS — F250 Schizoaffective disorder, bipolar type: Principal | ICD-10-CM | POA: Diagnosis present

## 2017-08-16 DIAGNOSIS — F17210 Nicotine dependence, cigarettes, uncomplicated: Secondary | ICD-10-CM | POA: Diagnosis present

## 2017-08-16 DIAGNOSIS — Z9114 Patient's other noncompliance with medication regimen: Secondary | ICD-10-CM

## 2017-08-16 DIAGNOSIS — Z79899 Other long term (current) drug therapy: Secondary | ICD-10-CM

## 2017-08-16 DIAGNOSIS — Z5181 Encounter for therapeutic drug level monitoring: Secondary | ICD-10-CM | POA: Diagnosis not present

## 2017-08-16 DIAGNOSIS — F29 Unspecified psychosis not due to a substance or known physiological condition: Secondary | ICD-10-CM | POA: Diagnosis not present

## 2017-08-16 MED ORDER — ACETAMINOPHEN 325 MG PO TABS: 650.0000 mg | ORAL_TABLET | Freq: Four times a day (QID) | ORAL | Status: DC | PRN

## 2017-08-16 MED ORDER — MAGNESIUM HYDROXIDE 400 MG/5ML PO SUSP: 30.0000 mL | Freq: Every day | ORAL | Status: DC | PRN

## 2017-08-16 MED ORDER — DIVALPROEX SODIUM 500 MG PO DR TAB
500.0000 mg | DELAYED_RELEASE_TABLET | Freq: Two times a day (BID) | ORAL | Status: DC
  Administered 2017-08-16 – 2017-08-29 (×26): 500 mg via ORAL
  Filled 2017-08-16 (×29): qty 1

## 2017-08-16 MED ORDER — HYDROXYZINE HCL 50 MG PO TABS
50.0000 mg | ORAL_TABLET | Freq: Three times a day (TID) | ORAL | Status: DC | PRN
Start: 2017-08-16 — End: 2017-08-29

## 2017-08-16 MED ORDER — ALUM & MAG HYDROXIDE-SIMETH 200-200-20 MG/5ML PO SUSP: 30.0000 mL | ORAL | Status: DC | PRN

## 2017-08-16 MED ORDER — NICOTINE 21 MG/24HR TD PT24
21.0000 mg | MEDICATED_PATCH | Freq: Every day | TRANSDERMAL | Status: DC
  Administered 2017-08-21 – 2017-08-22 (×2): 21 mg via TRANSDERMAL
  Filled 2017-08-16 (×10): qty 1

## 2017-08-16 MED ORDER — ARIPIPRAZOLE 5 MG PO TABS
15.0000 mg | ORAL_TABLET | Freq: Every day | ORAL | Status: DC
  Administered 2017-08-17: 08:00:00 15 mg via ORAL
  Filled 2017-08-16: qty 1

## 2017-08-16 MED ORDER — TRAZODONE HCL 100 MG PO TABS
100.0000 mg | ORAL_TABLET | Freq: Every evening | ORAL | Status: DC | PRN
  Administered 2017-08-16 – 2017-08-21 (×3): 100 mg via ORAL
  Filled 2017-08-16 (×4): qty 1

## 2017-08-16 NOTE — ED Notes (Signed)
Breakfast meal tray provided.

## 2017-08-16 NOTE — ED Notes (Signed)
IVC/  PENDING  PLACEMENT 

## 2017-08-16 NOTE — BH Assessment (Signed)
Patient is to be admitted to [facility] by Dr. Weber Cooks.  Attending Physician will be Dr. Wonda Olds   Patient has been assigned to room 312, by [facility].   ER staff is aware of the admission:  Annette,ER Sectary   Dr. Burlene Arnt, ER MD   Irine Seal, Patient's Nurse   Genella Rife, Patient Access  Waiting for current patient in room 312 to discharge before [place] can transfer to [facility].

## 2017-08-16 NOTE — Progress Notes (Addendum)
Patient ID: Dustin [name], male   DOB: 09/11/1955, 62 y.o.   MRN: [id number] 62 year old black male admitted IVC for psychosis and paranoia. Patient is minimally cooperative with admission process due to feeling like he has been "kidnapped" from his home. Patient does not feel he needs to be here. Patient acknowledged that he was writing on the walls and being paranoid at home but feels that "it is my home, I should be able to act any way I want at home." Patient would not talk about his medication except to say he was not taking it. Reports drinking one shot of gin per night prior to bed. Reports smoking 10 cigarettes per day, declined cessation education. Denies drug use, "I don't like how I feel." Patient arrived on the unit irritable and feeling he should not be here. States, "They told me in the ED that I was going home tomorrow so I will stay tonight but I'm leaving tomorrow." Denies any significant health issues. Reports living with his brother whom he reports as the only person in his circle. "I like to keep to myself. That way there no problems." Patient signed forms. Declined notification of others if ER medication or seclusion/restraint necessary. Skin assessment completed. Belongings inventoried. No contraband found. Denies SI/HI. Denies AVH. Denies depression and anxiety. Q 15 minute checks initiated. Will continue to monitor throughout the shift.  Patient refused to come to treatment room for EKG. Offered to complete in his bed, patient agreed. Patient then launched into profanity filled threats regarding the fact that he believes he will be leaving tomorrow. Patient states, "You better get this in here Pointing to the roof of his mouth, indicating my brain). I am not gonna be here after tomorrow. They are not gonna fill me full of medicine because the medicine kills my brain. I will not lose my brain for you." EKG completed and placed in chart. Compliant with HS medications. Given trazodone for sleep.  Will monitor for efficacy. Patient will not allow door to be closed.  Patient slept 3 hours. Patient was restless throughout the night. Will endorse care to oncoming shift.

## 2017-08-16 NOTE — Tx Team (Signed)
Initial Treatment Plan 08/16/2017 9:13 PM Dionis Autry Rueda HAL:[id number]    PATIENT STRESSORS: Medication change or noncompliance   PATIENT STRENGTHS: Average or above average intelligence Communication skills Physical Health   PATIENT IDENTIFIED PROBLEMS:   Psychosis 08/16/2017  Paranoia 08/16/2017  Non-compliance with medication 07/19/2017               DISCHARGE CRITERIA:  Adequate post-discharge living arrangements Improved stabilization in mood, thinking, and/or behavior Motivation to continue treatment in a less acute level of care Reduction of life-threatening or endangering symptoms to within safe limits Verbal commitment to aftercare and medication compliance  PRELIMINARY DISCHARGE PLAN: Attend aftercare/continuing care group Outpatient therapy Return to previous living arrangement  PATIENT/FAMILY INVOLVEMENT: This treatment plan has been presented to and reviewed with the patient, ARIHANT PENNINGS, and/or family member.  The patient and family have been given the opportunity to ask questions and make suggestions.  Derek Mound, RN 08/16/2017, 9:13 PM

## 2017-08-16 NOTE — Plan of Care (Signed)
New Admit Not Progressing Elimination: Will not experience complications related to bowel motility 08/16/2017 2117 - Not Progressing by Derek Mound, RN Will not experience complications related to urinary retention 08/16/2017 2117 - Not Progressing by Derek Mound, RN Skin Integrity: Risk for impaired skin integrity will decrease 08/16/2017 2117 - Not Progressing by Derek Mound, RN Activity: Will verbalize the importance of balancing activity with adequate rest periods 08/16/2017 2117 - Not Progressing by Derek Mound, RN Education: Will be free of psychotic symptoms 08/16/2017 2117 - Not Progressing by Derek Mound, RN Knowledge of the prescribed therapeutic regimen will improve 08/16/2017 2117 - Not Progressing by Derek Mound, RN Coping: Ability to cope will improve 08/16/2017 2117 - Not Progressing by Derek Mound, RN Ability to verbalize feelings will improve 08/16/2017 2117 - Not Progressing by Derek Mound, RN Health Behavior/Discharge Planning: Compliance with prescribed medication regimen will improve 08/16/2017 2117 - Not Progressing by Derek Mound, RN Nutritional: Ability to achieve adequate nutritional intake will improve 08/16/2017 2117 - Not Progressing by Derek Mound, RN Role Relationship: Ability to communicate needs accurately will improve 08/16/2017 2117 - Not Progressing by Derek Mound, RN Ability to interact with others will improve 08/16/2017 2117 - Not Progressing by Derek Mound, RN Safety: Ability to redirect hostility and anger into socially appropriate behaviors will improve 08/16/2017 2117 - Not Progressing by Derek Mound, RN Ability to remain free from injury will improve 08/16/2017 2117 - Not Progressing by Derek Mound, RN Self-Care: Ability to participate in self-care as condition permits will improve 08/16/2017 2117 - Not Progressing by Derek Mound, RN Self-Concept: Ability to verbalize positive feelings  about self will improve 08/16/2017 2117 - Not Progressing by Derek Mound, RN

## 2017-08-16 NOTE — ED Notes (Signed)
Toiletries provided, and patient performed ADLs independently without any acute distress noted.

## 2017-08-16 NOTE — ED Notes (Signed)
Hourly rounding reveals patient in room. No complaints, stable, in no acute distress. Q15 minute rounds and monitoring via Security Cameras to continue. 

## 2017-08-16 NOTE — BHH Group Notes (Signed)
[facility] Group Notes:  (Nursing/MHT/Case Management/Adjunct)  Date:  08/16/2017  Time:  9:31 PM  Type of Therapy:  Group Therapy  Participation Level:  Did Not Attend  Summary of Progress/Problems:  Dustin [name] 08/16/2017, 9:31 PM

## 2017-08-16 NOTE — ED Provider Notes (Signed)
-----------------------------------------   7:13 AM on 08/16/2017 -----------------------------------------   Blood pressure 123/84, pulse 68, temperature 98.1 F (36.7 C), temperature source Oral, resp. rate 20, height 6\' 1"  (1.854 m), weight 72.6 kg (160 lb), SpO2 99 %.  The patient had no acute events since last update.  Calm and cooperative at this time.  Disposition is pending Psychiatry/Behavioral Medicine team recommendations.     Schuyler Amor, MD 08/16/17 [phone]

## 2017-08-16 NOTE — ED Notes (Signed)
Patient had a bowel movement in the shower.

## 2017-08-16 NOTE — ED Notes (Signed)
Report to include Situation, Background, Assessment, and Recommendations received from [facility]. Patient alert and oriented, warm and dry, in no acute distress. Patient denies SI, HI, AVH and pain. Patient made aware of Q15 minute rounds and security cameras for their safety. Patient instructed to come to me with needs or concerns.

## 2017-08-17 DIAGNOSIS — F250 Schizoaffective disorder, bipolar type: Principal | ICD-10-CM

## 2017-08-17 LAB — LIPID PANEL
Cholesterol: 131 mg/dL (ref 0–200)
HDL: 46 mg/dL (ref >40)
LDL CALC: 63 mg/dL (ref 0–99)
TRIGLYCERIDES: 111 mg/dL (ref <150)
Total CHOL/HDL Ratio: 2.8 RATIO
VLDL: 22 mg/dL (ref 0–40)

## 2017-08-17 LAB — HEMOGLOBIN A1C
HEMOGLOBIN A1C: 5.4 % (ref 4.8–5.6)
Mean Plasma Glucose: 108.28 mg/dL

## 2017-08-17 LAB — TSH: TSH: 1.058 u[IU]/mL (ref 0.350–4.500)

## 2017-08-17 MED ORDER — PALIPERIDONE ER 3 MG PO TB24
6.0000 mg | ORAL_TABLET | Freq: Every day | ORAL | Status: DC
  Administered 2017-08-17 – 2017-08-18 (×2): 6 mg via ORAL
  Filled 2017-08-17 (×2): qty 2

## 2017-08-17 MED ORDER — OLANZAPINE 5 MG PO TABS: 5.0000 mg | ORAL_TABLET | Freq: Four times a day (QID) | ORAL | Status: DC | PRN

## 2017-08-17 NOTE — BHH Suicide Risk Assessment (Signed)
[facility] INPATIENT:  Family/Significant Other Suicide Prevention Education  Suicide Prevention Education:  Contact Attempts: [place], [place]. Patients Brother, [phone] has been identified by the patient as the family member/significant other with whom the patient will be residing, and identified as the person(s) who will aid the patient in the event of a mental health crisis.  With written consent from the patient, two attempts were made to provide suicide prevention education, prior to and/or following the patient's discharge.  We were unsuccessful in providing suicide prevention education.  A suicide education pamphlet was given to the patient to share with family/significant other.  Date and time of first attempt:CSW called at 11:20am and was unable to reach the brother. CSW called and left a voicemail for someone to return the phone call.   Darin Engels 08/17/2017, 11:24 AM

## 2017-08-17 NOTE — H&P (Addendum)
Psychiatric Admission Assessment Adult  Patient Identification: Dustin [name] MRN:  [id number] Date of Evaluation:  08/17/2017 Chief Complaint: "Dr. Loni Muse wants me to believe what she believes."  Principal Diagnosis: Schizoaffective disorder, bipolar type ([place]) Diagnosis:   Patient Active Problem List   Diagnosis Date Noted  . Schizoaffective disorder, bipolar type ([place]) [F25.0] 01/24/2017  . Tardive dyskinesia [G24.01] 01/24/2017  . Tobacco use disorder [F17.200] 11/29/2016   History of Present Illness: 62 yo male admitted due to decompensation of schizophrenia and medication noncompliance. His [facility] psychiatrist filed committment papers stating that he has been refusing medications, very agitated, making threats including alleging that he had threatened to rape someone. He was very disorganized and hyperreligious in the ED. Per chart review, he was hospitalized on our unit in 2018 with very similary symptoms. He was on Prolixin LAI and oral Abilify. It was noted that he does have TD and recommend second generation anti-psychotic.   Upon assessment today, he is calm and pleasant. He is very disorganized and answers most questions with an irrelevant response. He states, "Dr. Loni Muse she wants me to believe something that I don't believe. Like schizophrenia and bipolar." He states, "They claim they have papers but I will go home." He states that he lives with his brother in a trailer. He does state that he gets an injection but is not sure what he gets. He becomes very disorganized as interview goes on. He states, "She was sitting near the bible. Its not in the bible, I believe in the holocaust warriors." He is hyperreligious. He denies AH and states, "There is no such thing as voices." He denies feeling depressed or suicidal. Denies Homicidal thoughts. Difficult to get much history from patient.  I spoke with his RN and psychiatrist with [name]. They state that the only medication he is on  is Abilify 400 mg monthly but he often refuses medications. His next injection was due on 3/13. He was on Prolixin in the past but he was refusing to get the shots every 2 weeks so they agreed on Abilify. He has not been doing well on just this medication. At baseline, he is very hyperreligious and somewhat disorganized. He believes that all medications are poison. He is very paranoid and delusional. He writes on every surface of his walls. He sometimes makes sexual statements. He does live with his brother who is deaf and does not speak. She is open to a trial of [place]. She is in agreement that he requires long acting injectable and may possible need two anti-psychotics.   Associated Signs/Symptoms: Depression Symptoms:  Denies (Hypo) Manic Symptoms:  Labiality of Mood, Anxiety Symptoms:  Denies Psychotic Symptoms:  Delusions, Paranoia, PTSD Symptoms: Negative Total Time spent with patient: 1 hour  Past Psychiatric History: History of schizophrenia. He follows with [name]. He was on Prolixin Dec and Abilify in the past. He has had many inpatient hospitalization-it appears last hospitalization was last year. He was on Prolixin Decanoate in the past.   Is the patient at risk to self? Yes.    Has the patient been a risk to self in the past 6 months? Yes.    Has the patient been a risk to self within the distant past? Yes.    Is the patient a risk to others? Yes.    Has the patient been a risk to others in the past 6 months? Yes.    Has the patient been a  risk to others within the distant past? Yes.     Alcohol Screening: 1. How often do you have a drink containing alcohol?: 4 or more times a week 2. How many drinks containing alcohol do you have on a typical day when you are drinking?: 1 or 2 3. How often do you have six or more drinks on one occasion?: Never AUDIT-C Score: 4 4. How often during the last year have you found that you were not able to stop drinking once  you had started?: Never 5. How often during the last year have you failed to do what was normally expected from you becasue of drinking?: Never 6. How often during the last year have you needed a first drink in the morning to get yourself going after a heavy drinking session?: Never 7. How often during the last year have you had a feeling of guilt of remorse after drinking?: Never 8. How often during the last year have you been unable to remember what happened the night before because you had been drinking?: Never 9. Have you or someone else been injured as a result of your drinking?: No 10. Has a relative or friend or a doctor or another health worker been concerned about your drinking or suggested you cut down?: No Alcohol Use Disorder Identification Test Final Score (AUDIT): 4 Intervention/Follow-up: AUDIT Score <7 follow-up not indicated Substance Abuse History in the last 12 months:  No., he states, that he takes "One shot of Gin a day." Denies drinking more than this. Denies MJ or other drug use.  Consequences of Substance Abuse: Negative Previous Psychotropic Medications: Yes  Psychological Evaluations: Yes  Past Medical History:  Past Medical History:  Diagnosis Date  . Schizophrenia ([place])    History reviewed. No pertinent surgical history. Family History: History reviewed. No pertinent family history. Family Psychiatric  History: Unknown Tobacco Screening: Have you used any form of tobacco in the last 30 days? (Cigarettes, Smokeless Tobacco, Cigars, and/or Pipes): Yes Tobacco use, Select all that apply: 5 or more cigarettes per day Are you interested in Tobacco Cessation Medications?: No, patient refused Counseled patient on smoking cessation including recognizing danger situations, developing coping skills and basic information about quitting provided: Refused/Declined practical counseling Social History: Lives with his brother in a trailer. He states that he has a payee. He is on  disability. NO kids.  Social History   Substance and Sexual Activity  Alcohol Use Yes  . Alcohol/week: 4.2 oz  . Types: 7 Shots of liquor per week   Comment: 1 drink every night- gin     Social History   Substance and Sexual Activity  Drug Use No       Pain Medications: none Prescriptions: noncompliant Over the Counter: none History of alcohol / drug use?: No history of alcohol / drug abuse Longest period of sobriety (when/how long): unknown Negative Consequences of Use: (none) Withdrawal Symptoms: (none)                    Allergies:  No Known Allergies Lab Results:  Results for orders placed or performed during the hospital encounter of 08/16/17 (from the past 48 hour(s))  Lipid panel     Status: None   Collection Time: 08/17/17  9:22 AM  Result Value Ref Range   Cholesterol 131 0 - 200 mg/dL   Triglycerides 111 <150 mg/dL   HDL 46 >40 mg/dL   Total CHOL/HDL Ratio 2.8 RATIO   VLDL 22 0 -  40 mg/dL   LDL Cholesterol 63 0 - 99 mg/dL    Comment:        Total Cholesterol/HDL:CHD Risk Coronary Heart Disease Risk Table                     Men   Women  1/2 Average Risk   3.4   3.3  Average Risk       5.0   4.4  2 X Average Risk   9.6   7.1  3 X Average Risk  23.4   11.0        Use the calculated Patient Ratio above and the CHD Risk Table to determine the patient's CHD Risk.        ATP III CLASSIFICATION (LDL):  <100     mg/dL   Optimal  100-129  mg/dL   Near or Above                    Optimal  130-159  mg/dL   Borderline  160-189  mg/dL   High  >190     mg/dL   Very High Performed at [name], [address] [place], [place] [address]   TSH     Status: None   Collection Time: 08/17/17  9:22 AM  Result Value Ref Range   TSH 1.058 0.350 - 4.500 uIU/mL    Comment: Performed by a 3rd Generation assay with a functional sensitivity of <=0.01 uIU/mL. Performed at [facility], [address] [place], [place] [address]     Blood  Alcohol level:  Lab Results  Component Value Date   [facility] <10 08/15/2017   ETH <5 64/40/3474    Metabolic Disorder Labs:  Lab Results  Component Value Date   HGBA1C 5.3 01/12/2017   MPG 105.41 01/12/2017   MPG 114 12/13/2016   No results found for: PROLACTIN Lab Results  Component Value Date   CHOL 131 08/17/2017   TRIG 111 08/17/2017   HDL 46 08/17/2017   CHOLHDL 2.8 08/17/2017   VLDL 22 08/17/2017   LDLCALC 63 08/17/2017   LDLCALC 88 01/12/2017    Current Medications: Current Facility-Administered Medications  Medication Dose Route Frequency Provider Last Rate Last Dose  . acetaminophen (TYLENOL) tablet 650 mg  650 mg Oral Q6H PRN [name], [name], MD      . alum & mag hydroxide-simeth (MAALOX/MYLANTA) 200-200-20 MG/5ML suspension 30 mL  30 mL Oral Q4H PRN [name], [name], MD      . divalproex (DEPAKOTE) DR tablet 500 mg  500 mg Oral Q12H [name], [name], MD   500 mg at 08/17/17 0802  . hydrOXYzine (ATARAX/VISTARIL) tablet 50 mg  50 mg Oral TID PRN [name], [name], MD      . magnesium hydroxide (MILK OF MAGNESIA) suspension 30 mL  30 mL Oral Daily PRN [name], [name], MD      . nicotine (NICODERM CQ - dosed in mg/24 hours) patch 21 mg  21 mg Transdermal Daily [name], [name], MD      . paliperidone (INVEGA) 24 hr tablet 6 mg  6 mg Oral Daily [name], [name], MD      . traZODone (DESYREL) tablet 100 mg  100 mg Oral QHS PRN [name], [name], MD   100 mg at 08/16/17 2157   PTA Medications: Medications Prior to Admission  Medication Sig Dispense Refill Last Dose  . fluPHENAZine decanoate (PROLIXIN) 25 MG/ML injection Inject 2  mLs (50 mg total) into the muscle every 21 ( twenty-one) days. 2 mL 0 Past Month at Unknown time  . ARIPiprazole (ABILIFY) 15 MG tablet Take 1 tablet (15 mg total) by mouth daily. (Patient not taking: Reported on 08/16/2017) 30 tablet 0 Not Taking at Unknown time  . divalproex (DEPAKOTE) 500 MG DR tablet Take 1 tablet (500 mg total) by mouth every 12 (twelve)  hours. (Patient not taking: Reported on 08/16/2017) 60 tablet 0 Not Taking at Unknown time  . zolpidem (AMBIEN) 10 MG tablet Take 1 tablet (10 mg total) by mouth at bedtime as needed for sleep. (Patient not taking: Reported on 08/16/2017) 30 tablet 0 Not Taking at Unknown time    Musculoskeletal: Strength & Muscle Tone: within normal limits Gait & Station: normal Patient leans: N/A  Psychiatric Specialty Exam: Physical Exam  Nursing note and vitals reviewed.   Review of Systems  All other systems reviewed and are negative.   Blood pressure 106/83, pulse 91, temperature 98.6 F (37 C), temperature source Oral, resp. rate 18, height 5\' 9"  (1.753 m), weight 77.6 kg (171 lb), SpO2 99 %.Body mass index is 25.25 kg/m.  General Appearance: Disheveled  Eye Contact:  Minimal  Speech:  Garbled  Volume:  Decreased  Mood:  Irritable  Affect:  Labile  Thought Process:  Disorganized  Orientation:  Full (Time, Place, and Person)  Thought Content:  Illogical and Delusions  Suicidal Thoughts:  No  Homicidal Thoughts:  No  Memory:  Immediate;   Poor  Judgement:  Impaired  Insight:  Lacking  Psychomotor Activity:  Normal  Concentration:  Concentration: Poor  Recall:  Poor  Fund of Knowledge:  Poor  Language:  Poor  Akathisia:  No      Assets:  Resilience  ADL's:  Intact  Cognition:  Impaired,  Moderate  Sleep:  Number of Hours: 3    Treatment Plan Summary: 62 yo male admitted due to decompensation and refusing medications. He is calm but disorganized today. I spoke with his outpatient psychiatrist who agrees to starting [place] with plan to give [place]. He requires LAI due to noncompliance.   Plan:  Schizoaffective disorder -Start oral Invega with plan to give [place] if he tolerates oral -Continue Depakote 500 mg BID. Check Level  EKG reviewed QTc 378, Labs reviewed Will check UA  Dispo -He follows with [name]. He is his own guardian.    Observation  Level/Precautions:  15 minute checks  Laboratory:  UA  Psychotherapy:    Medications:    Consultations:    Discharge Concerns:    Estimated LOS: 5-7 days  Other:     Physician Treatment Plan for Primary Diagnosis: Schizoaffective disorder, bipolar type ([place]) Long Term Goal(s): Improvement in symptoms so as ready for discharge  Short Term Goals: Compliance with prescribed medications will improve    I certify that inpatient services furnished can reasonably be expected to improve the patient's condition.    Marylin Crosby, MD 3/15/201911:46 AM

## 2017-08-17 NOTE — BHH Group Notes (Signed)
LCSW Group Therapy Note  08/17/2017 1:00 pm  Type of Therapy and Topic:  Group Therapy:  Feelings around Relapse and Recovery  Participation Level:  Did Not Attend   Description of Group:    Patients in this group will discuss emotions they experience before and after a relapse. They will process how experiencing these feelings, or avoidance of experiencing them, relates to having a relapse. Facilitator will guide patients to explore emotions they have related to recovery. Patients will be encouraged to process which emotions are more powerful. They will be guided to discuss the emotional reaction significant others in their lives may have to their relapse or recovery. Patients will be assisted in exploring ways to respond to the emotions of others without this contributing to a relapse.  Therapeutic Goals: 1. Patient will identify two or more emotions that lead to a relapse for them 2. Patient will identify two emotions that result when they relapse 3. Patient will identify two emotions related to recovery 4. Patient will demonstrate ability to communicate their needs through discussion and/or role plays   Summary of Patient Progress:     Therapeutic Modalities:   Cognitive Behavioral Therapy Solution-Focused Therapy Assertiveness Training Relapse Prevention Therapy   Dustin [name], [place] 08/17/2017 2:47 PM

## 2017-08-17 NOTE — B[facility] Suicide Risk Assessment (Signed)
[facility] INPATIENT:  Family/Significant Other Suicide Prevention Education  Suicide Prevention Education:  Education Completed; Dustin [name], [phone] has been identified by the patient as the family member/significant other with whom the patient will be residing, and identified as the person(s) who will aid the patient in the event of a mental health crisis (suicidal ideations/suicide attempt).  With written consent from the patient, the family member/significant other has been provided the following suicide prevention education, prior to the and/or following the discharge of the patient.  The suicide prevention education provided includes the following:  Suicide risk factors  Suicide prevention and interventions  National Suicide Hotline telephone number  [facility] assessment telephone number  [facility] Emergency Assistance [place] and/or Residential Mobile Crisis Unit telephone number  Request made of family/significant other to:  Remove weapons (e.g., guns, rifles, knives), all items previously/currently identified as safety concern.    Remove drugs/medications (over-the-counter, prescriptions, illicit drugs), all items previously/currently identified as a safety concern.  The family member/significant other verbalizes understanding of the suicide prevention education information provided.  The family member/significant other agrees to remove the items of safety concern listed above.  CSW spoke with patients brother Dustin [name] regarding the patient. Brother states that he is willing to work with the patient with taking his medications and getting to any appointments. He says that the ACTT Team doesn't communicate with him about their concerns with the patient not taking his medications. He says that he is willing to be there when they come and take his medications so that he can help. He also feels like the patient needs a program that he can go to  1-2 times a week to keep him occupied. [place] doesn't report any guns/weapons in the home.   Darin Engels 08/17/2017, 11:41 AM

## 2017-08-17 NOTE — Tx Team (Signed)
Interdisciplinary Treatment and Diagnostic Plan Update  08/17/2017 Time of Session: 10:30am Dustin [name] MRN: [id number]  Principal Diagnosis: <principal problem not specified>  Secondary Diagnoses: Active Problems:   Schizoaffective disorder, bipolar type (HCC)   Current Medications:  Current Facility-Administered Medications  Medication Dose Route Frequency Provider Last Rate Last Dose  . acetaminophen (TYLENOL) tablet 650 mg  650 mg Oral Q6H PRN [name], [name], MD      . alum & mag hydroxide-simeth (MAALOX/MYLANTA) 200-200-20 MG/5ML suspension 30 mL  30 mL Oral Q4H PRN [name], [name], MD      . divalproex (DEPAKOTE) DR tablet 500 mg  500 mg Oral Q12H [name], [name], MD   500 mg at 08/17/17 0802  . hydrOXYzine (ATARAX/VISTARIL) tablet 50 mg  50 mg Oral TID PRN [name], [name], MD      . magnesium hydroxide (MILK OF MAGNESIA) suspension 30 mL  30 mL Oral Daily PRN [name], [name], MD      . nicotine (NICODERM CQ - dosed in mg/24 hours) patch 21 mg  21 mg Transdermal Daily [name], [name], MD      . paliperidone (INVEGA) 24 hr tablet 6 mg  6 mg Oral Daily [name], [name], MD      . traZODone (DESYREL) tablet 100 mg  100 mg Oral QHS PRN [name], [name], MD   100 mg at 08/16/17 2157   PTA Medications: Medications Prior to Admission  Medication Sig Dispense Refill Last Dose  . fluPHENAZine decanoate (PROLIXIN) 25 MG/ML injection Inject 2 mLs (50 mg total) into the muscle every 21 ( twenty-one) days. 2 mL 0 Past Month at Unknown time  . ARIPiprazole (ABILIFY) 15 MG tablet Take 1 tablet (15 mg total) by mouth daily. (Patient not taking: Reported on 08/16/2017) 30 tablet 0 Not Taking at Unknown time  . divalproex (DEPAKOTE) 500 MG DR tablet Take 1 tablet (500 mg total) by mouth every 12 (twelve) hours. (Patient not taking: Reported on 08/16/2017) 60 tablet 0 Not Taking at Unknown time  . zolpidem (AMBIEN) 10 MG tablet Take 1 tablet (10 mg total) by mouth at bedtime as needed for sleep.  (Patient not taking: Reported on 08/16/2017) 30 tablet 0 Not Taking at Unknown time    Patient Stressors: Medication change or noncompliance  Patient Strengths: Average or above average intelligence Communication skills Physical Health  Treatment Modalities: Medication Management, Group therapy, Case management,  1 to 1 session with clinician, Psychoeducation, Recreational therapy.   Physician Treatment Plan for Primary Diagnosis: <principal problem not specified> Long Term Goal(s):     Short Term Goals:    Medication Management: Evaluate patient's response, side effects, and tolerance of medication regimen.  Therapeutic Interventions: 1 to 1 sessions, Unit Group sessions and Medication administration.  Evaluation of Outcomes: Progressing  Physician Treatment Plan for Secondary Diagnosis: Active Problems:   Schizoaffective disorder, bipolar type ([place])  Long Term Goal(s):     Short Term Goals:       Medication Management: Evaluate patient's response, side effects, and tolerance of medication regimen.  Therapeutic Interventions: 1 to 1 sessions, Unit Group sessions and Medication administration.  Evaluation of Outcomes: Progressing   RN Treatment Plan for Primary Diagnosis: <principal problem not specified> Long Term Goal(s): Knowledge of disease and therapeutic regimen to maintain health will improve  Short Term Goals: Ability to demonstrate self-control, Ability to participate in decision making will improve, Ability to identify and develop effective coping behaviors will improve and Compliance with  prescribed medications will improve  Medication Management: RN will administer medications as ordered by provider, will assess and evaluate patient's response and provide education to patient for prescribed medication. RN will report any adverse and/or side effects to prescribing provider.  Therapeutic Interventions: 1 on 1 counseling sessions, Psychoeducation, Medication  administration, Evaluate responses to treatment, Monitor vital signs and CBGs as ordered, Perform/monitor CIWA, COWS, AIMS and Fall Risk screenings as ordered, Perform wound care treatments as ordered.  Evaluation of Outcomes: Progressing   LCSW Treatment Plan for Primary Diagnosis: <principal problem not specified> Long Term Goal(s): Safe transition to appropriate next level of care at discharge, Engage patient in therapeutic group addressing interpersonal concerns.  Short Term Goals: Engage patient in aftercare planning with referrals and resources, Facilitate acceptance of mental health diagnosis and concerns, Facilitate patient progression through stages of change regarding substance use diagnoses and concerns, Identify triggers associated with mental health/substance abuse issues and Increase skills for wellness and recovery  Therapeutic Interventions: Assess for all discharge needs, 1 to 1 time with Social worker, Explore available resources and support systems, Assess for adequacy in community support network, Educate family and significant other(s) on suicide prevention, Complete Psychosocial Assessment, Interpersonal group therapy.  Evaluation of Outcomes: Progressing   Progress in Treatment: Attending groups: No. Participating in groups: No. Taking medication as prescribed: Yes. Toleration medication: Yes. Family/Significant other contact made: No, will contact:  Patients brother Patient understands diagnosis: No. Discussing patient identified problems/goals with staff: Yes. Medical problems stabilized or resolved: Yes. Denies suicidal/homicidal ideation: Yes. Issues/concerns per patient self-inventory: No. Other:  New problem(s) identified: No, Describe:  None  New Short Term/Long Term Goal(s): "To get out of here."  Discharge Plan or Barriers: . At discharge, patient will return back home with his brother and continue to follow up with his ACTT Team.   Reason for  Continuation of Hospitalization: Medication stabilization  Estimated Length of Stay: 7 days  Attendees: Patient: Dustin [name] 08/17/2017 11:26 AM  Physician: Amador Cunas, MD 08/17/2017 11:26 AM  Nursing: Eugenio Hoes, RN 08/17/2017 11:26 AM  RN Care Manager: 08/17/2017 11:26 AM  Social Worker: Darin Engels, [place] 08/17/2017 11:26 AM  Recreational Therapist:  08/17/2017 11:26 AM  Other:  08/17/2017 11:26 AM  Other:  08/17/2017 11:26 AM  Other: 08/17/2017 11:26 AM    Scribe for Treatment Team: Darin Engels, LCSW 08/17/2017 11:26 AM

## 2017-08-17 NOTE — BHH Suicide Risk Assessment (Signed)
[facility] Admission Suicide Risk Assessment   Nursing information obtained from:  Patient Demographic factors:  Male, Low socioeconomic status, Unemployed Current Mental Status:  NA Loss Factors:  NA Historical Factors:  NA Risk Reduction Factors:  Living with another person, especially a relative  Total Time spent with patient: 45 minutes Principal Problem: Schizoaffective disorder, bipolar type ([place]) Diagnosis:   Patient Active Problem List   Diagnosis Date Noted  . Schizoaffective disorder, bipolar type ([place]) [F25.0] 01/24/2017    Priority: High  . Tardive dyskinesia [G24.01] 01/24/2017  . Tobacco use disorder [F17.200] 11/29/2016   Subjective Data: See H&P  Continued Clinical Symptoms:  Alcohol Use Disorder Identification Test Final Score (AUDIT): 4 The "Alcohol Use Disorders Identification Test", Guidelines for Use in Primary Care, Second Edition.  World Pharmacologist [facility]). Score between 0-7:  no or low risk or alcohol related problems. Score between 8-15:  moderate risk of alcohol related problems. Score between 16-19:  high risk of alcohol related problems. Score 20 or above:  warrants further diagnostic evaluation for alcohol dependence and treatment.   CLINICAL FACTORS:   Schizophrenia:   Paranoid or undifferentiated type     COGNITIVE FEATURES THAT CONTRIBUTE TO RISK:  Closed-mindedness and Polarized thinking    SUICIDE RISK:   Mild:  Suicidal ideation of limited frequency, intensity, duration, and specificity.   PLAN OF CARE: See H&P  I certify that inpatient services furnished can reasonably be expected to improve the patient's condition.   Marylin Crosby, MD 08/17/2017, 1:42 PM

## 2017-08-17 NOTE — Plan of Care (Signed)
Patient up and present in the milieu. Patient shows some irritation towards staff and become belligerent to staff periodically, overall patient has been compliant with medication and meals.  Minimal interaction with his peers and when he speaks it is usually in a low tone. Milieu remains safe and therapeutic. Will continue to monitor.

## 2017-08-17 NOTE — BHH Counselor (Signed)
Adult Comprehensive Assessment  Patient ID: Dustin [name], male   DOB: 1955/12/14, 62 y.o.   MRN: [id number]  IInformation Source: Information source: Patient   Current Stressors:  Educational / Learning stressors: No stressors identified  Employment / Job issues: No stressors identified  Family Relationships: No stressors identified  Museum/gallery curator / Lack of resources (include bankruptcy): No stressors identified  Housing / Lack of housing: No stressors identified  Physical health (include injuries & life threatening diseases): No stressors identified  Social relationships: No stressors identified  Substance abuse: Alcohol use, patient doesn't identify this as a problem  Bereavement / Loss: No stressors identified    Living/Environment/Situation:  Living Arrangements: Other relatives- Brother and sister-in-law Living conditions (as described by patient or guardian): They are fine  How long has patient lived in current situation?: Since 1994 What is atmosphere in current home: Comfortable, Supportive   Family History:  Marital status: Single Does patient have children?: No   Childhood History:  By whom was/is the patient raised?: Mother Description of patient's relationship with caregiver when they were a child: It was alright  Patient's description of current relationship with people who raised him/her: Mother deceased  How were you disciplined when you got in trouble as a child/adolescent?: Whoopings Does patient have siblings?: Yes Number of Siblings: 5 Description of patient's current relationship with siblings: 2 sisters, 3 brothers - do not see them often Did patient suffer any verbal/emotional/physical/sexual abuse as a child?: No Did patient suffer from severe childhood neglect?: No Has patient ever been sexually abused/assaulted/raped as an adolescent or adult?: No Was the patient ever a victim of a crime or a disaster?: No Witnessed domestic violence?: No Has patient been  effected by domestic violence as an adult?: No   Education:  Highest grade of school patient has completed: 9th grade  Currently a student?: No Name of school: n/a Learning disability?: No   Employment/Work Situation:   Employment situation: On disability Why is patient on disability: Mental health  How long has patient been on disability: Since 1993 What is the longest time patient has a held a job?: "It varies" Where was the patient employed at that time?: Unknown  Has patient ever been in the [employer]?: No Has patient ever served in combat?: No Did You Receive Any Psychiatric Treatment/Services While in the [employer]?: No Are There Guns or Other Weapons in [place]?: No Are These Psychologist, educational?:  (N/A)   Financial Resources:   Financial resources: Marine scientist SSDI Does patient have a Programmer, applications or guardian?: No   Alcohol/Substance Abuse:   What has been your use of drugs/alcohol within the last 12 months?: Pt reports alcohol use.  If attempted suicide, did drugs/alcohol play a role in this?: No Alcohol/Substance Abuse Treatment Hx: Denies past history Has alcohol/substance abuse ever caused legal problems?: No   Social Support System:   Patient's Community Support System: Good Describe Community Support System: Patient stated that his brother helps him alot - and he maintain on his own. 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 Type of faith/religion: Christianity  How does patient's faith help to cope with current illness?: Read the bible, talk to pastors   Leisure/Recreation:   Leisure and Hobbies: None identified    Strengths/Needs:   What things does the patient do well?: unknown In what areas does patient struggle / problems for patient: Possibly alcohol, medication compliance.   Discharge Plan:   Does patient have access to transportation?:  Yes- Says that his brother will come and get him Will patient be returning to same living situation  after discharge?: Yes with bother Currently receiving community mental health services: Yes- [facility]T Team.  Does patient have financial barriers related to discharge medications?: No   Summary/Recommendations:   Summary and Recommendations (to be completed by the evaluator): Patient is a 62 year old African American male admitted voluntarily by his ACTT Team for refusing to take his medications and alleging that he threatened to rape someone. Patient lives in [place] with his brother. Patient receives ACTT services with [employer]. He reports using Alcohol. His UDS was negative for all substances. Patients affect was labile. He presents with some paranoia and bizarre thinking. At discharge, patient will return back home with his brother and continue to follow up with his ACTT Team. While here, patient will benefit from crisis stabilization, medication evaluation, group therapy and psychoeducation, in addition to case management for discharge planning. At discharge, it is recommended that patient remain compliant with the established discharge plan and continue treatment  Darin Engels. 08/17/2017

## 2017-08-17 NOTE — BHH Counselor (Signed)
CSW spoke with patients brother Faraaz Wolin [phone] and he is wanting to be involved with patients treatment. Can call him if needed.  Darin Engels, MSW, [place], LCASA 08/17/2017 11:51 AM

## 2017-08-18 LAB — VALPROIC ACID LEVEL: VALPROIC ACID LVL: 55 ug/mL (ref 50.0–100.0)

## 2017-08-18 MED ORDER — PALIPERIDONE ER 3 MG PO TB24
9.0000 mg | ORAL_TABLET | Freq: Every day | ORAL | Status: DC
  Administered 2017-08-19 – 2017-08-29 (×11): 9 mg via ORAL
  Filled 2017-08-18 (×11): qty 3

## 2017-08-18 NOTE — Plan of Care (Signed)
Voice no concerns around his bowels  or voiding . Patient up moving about on unit  . Compliant  with medications . Remains to have altered  though process . Working  on Radiographer, therapeutic . Appetite good and voice no concerns around wake and sleep. Difficulty with  speech , noted to slur. Interacting  with with his peers  and staff . No safety  concerns  .   Progressing Elimination: Will not experience complications related to bowel motility 08/18/2017 1037 - Progressing by Leodis Liverpool, RN Will not experience complications related to urinary retention 08/18/2017 1037 - Progressing by Leodis Liverpool, RN Skin Integrity: Risk for impaired skin integrity will decrease 08/18/2017 1037 - Progressing by Leodis Liverpool, RN Activity: Will verbalize the importance of balancing activity with adequate rest periods 08/18/2017 1037 - Progressing by Leodis Liverpool, RN Education: Will be free of psychotic symptoms 08/18/2017 1037 - Progressing by Leodis Liverpool, RN Knowledge of the prescribed therapeutic regimen will improve 08/18/2017 1037 - Progressing by Leodis Liverpool, RN Coping: Ability to cope will improve 08/18/2017 1037 - Progressing by Leodis Liverpool, RN Ability to verbalize feelings will improve 08/18/2017 1037 - Progressing by Leodis Liverpool, [place] Behavior/Discharge Planning: Compliance with prescribed medication regimen will improve 08/18/2017 1037 - Progressing by Leodis Liverpool, RN Nutritional: Ability to achieve adequate nutritional intake will improve 08/18/2017 1037 - Progressing by Leodis Liverpool, RN Role Relationship: Ability to communicate needs accurately will improve 08/18/2017 1037 - Progressing by Leodis Liverpool, RN Ability to interact with others will improve 08/18/2017 1037 - Progressing by Leodis Liverpool, RN Safety: Ability to redirect hostility and anger into socially appropriate behaviors will improve 08/18/2017 1037 - Progressing by Leodis Liverpool, RN Ability  to remain free from injury will improve 08/18/2017 1037 - Progressing by Leodis Liverpool, RN Self-Care: Ability to participate in self-care as condition permits will improve 08/18/2017 1037 - Progressing by Leodis Liverpool, RN Self-Concept: Ability to verbalize positive feelings about self will improve 08/18/2017 1037 - Progressing by Leodis Liverpool, RN

## 2017-08-18 NOTE — Progress Notes (Signed)
D:Pt denies SI/HI/AVH. Pt is not very talkative, but does engage during speaking with this Probation officer. Pt. has no Complaints.  Patient Interaction is appropriate. Pt. Spent the entire shift isolative and withdrawn. Pt. Reported he just wanted to lay in his room.   A: Q x 15 minute observation checks were completed for safety. Patient was provided with education. Patient was given scheduled medications. Patient  was encourage to attend groups, participate in unit activities and continue with plan of care.   R:Patient is complaint with medication and unit procedures.              Precautionary checks every 15 minutes for safety maintained, room free of safety hazards, patient sustains no injury or falls during this shift.

## 2017-08-18 NOTE — Plan of Care (Signed)
Pt. Verbalizes understanding of provided education. Pt. Compliant with medications. Pt. Interaction is appropriate and engaging. Pt. Verbalizes he can remain safe while on the unit. Pt. Denies SI/HI. No psychotic agitations observed this evening.  Progressing Education: Will be free of psychotic symptoms 08/18/2017 0004 - Progressing by Reyes Ivan, RN 08/18/2017 0002 - Not Progressing by Reyes Ivan, RN Knowledge of the prescribed therapeutic regimen will improve 08/18/2017 0002 - Progressing by Reyes Ivan, RN Health Behavior/Discharge Planning: Compliance with prescribed medication regimen will improve 08/18/2017 0002 - Progressing by Reyes Ivan, RN Role Relationship: Ability to interact with others will improve 08/18/2017 0002 - Progressing by Reyes Ivan, RN Safety: Ability to remain free from injury will improve 08/18/2017 0002 - Progressing by Reyes Ivan, RN

## 2017-08-18 NOTE — Progress Notes (Signed)
D:Affect pleasant on approach Interacting with his peers  Appetite good . Appropriate ADL's  Voice no concerns around his bowels  or voiding . Patient up moving about on unit  . Compliant  with medications . Remains to have altered  though process . Working  on Radiographer, therapeutic . Appetite good and voice no concerns around wake and sleep. Difficulty with  speech , noted to slur. Interacting  with with his peers  and staff . No safety  concerns  . A: Encourage patient participation with unit programming . Instruction  Given on  Medication , verbalize understanding.  R: Voice no other concerns. Staff continue to monitor

## 2017-08-18 NOTE — BHH Group Notes (Signed)
LCSW Group Therapy Note  08/18/2017 1:15pm  Type of Therapy and Topic:  Group Therapy:  Fears and Unhealthy Coping Skills  Participation Level:  Did Not Attend   Description of Group:  The focus of this group was to discuss some of the prevalent fears that patients experience, and to identify the commonalities among group members.  An exercise was used to initiate the discussion, followed by writing on the white board a group-generated list of unhealthy coping and healthy coping techniques to deal with each fear.    Therapeutic Goals: 1. Patient will identify and describe 3 fears they experience 2. Patient will identify one positive coping strategy for each fear they experience 3. Patient will respond empathically to peers statements regarding fears they experience  Summary of Patient Progress:       Therapeutic Modalities Cognitive Behavioral Therapy Motivational Interviewing  [place], LCSW 08/18/2017 9:59 AM

## 2017-08-18 NOTE — Progress Notes (Signed)
[facility] MD Progress Note  08/18/2017 12:03 PM Dustin [name]  MRN:  [id number] Subjective:   Pt states he is "alright", pt reportedly bizarre, but med compliant, denies side effects. Denies AVH. Denies SI/HI. Principal Problem: Schizoaffective disorder, bipolar type ([place]) Diagnosis:   Patient Active Problem List   Diagnosis Date Noted  . Schizoaffective disorder, bipolar type ([place]) [F25.0] 01/24/2017  . Tardive dyskinesia [G24.01] 01/24/2017  . Tobacco use disorder [F17.200] 11/29/2016   Total Time spent with patient: 30 minutes  Past Psychiatric History: no ne winfo  Past Medical History:  Past Medical History:  Diagnosis Date  . Schizophrenia ([place])    History reviewed. No pertinent surgical history. Family History: History reviewed. No pertinent family history. Family Psychiatric  History: no new info Social History:  Social History   Substance and Sexual Activity  Alcohol Use Yes  . Alcohol/week: 4.2 oz  . Types: 7 Shots of liquor per week   Comment: 1 drink every night- gin     Social History   Substance and Sexual Activity  Drug Use No    Social History   Socioeconomic History  . Marital status: Single    Spouse name: None  . Number of children: None  . Years of education: None  . Highest education level: None  Social Needs  . Financial resource strain: None  . Food insecurity - worry: None  . Food insecurity - inability: None  . Transportation needs - medical: None  . Transportation needs - non-medical: None  Occupational History  . None  Tobacco Use  . Smoking status: Current Every Day Smoker    Packs/day: 0.50    Years: 15.00    Pack years: 7.50  . Smokeless tobacco: Never Used  Substance and Sexual Activity  . Alcohol use: Yes    Alcohol/week: 4.2 oz    Types: 7 Shots of liquor per week    Comment: 1 drink every night- gin  . Drug use: No  . Sexual activity: No    Birth control/protection: None  Other Topics Concern  . None  Social History  Narrative  . None   Additional Social History:    Pain Medications: none Prescriptions: noncompliant Over the Counter: none History of alcohol / drug use?: No history of alcohol / drug abuse Longest period of sobriety (when/how long): unknown Negative Consequences of Use: (none) Withdrawal Symptoms: (none)                    Sleep: Good  Appetite:  Good  Current Medications: Current Facility-Administered Medications  Medication Dose Route Frequency Provider Last Rate Last Dose  . acetaminophen (TYLENOL) tablet 650 mg  650 mg Oral Q6H PRN [name], [name], MD      . alum & mag hydroxide-simeth (MAALOX/MYLANTA) 200-200-20 MG/5ML suspension 30 mL  30 mL Oral Q4H PRN [name], [name], MD      . divalproex (DEPAKOTE) DR tablet 500 mg  500 mg Oral Q12H [name], [name], MD   500 mg at 08/18/17 7829  . hydrOXYzine (ATARAX/VISTARIL) tablet 50 mg  50 mg Oral TID PRN [name], [name], MD      . magnesium hydroxide (MILK OF MAGNESIA) suspension 30 mL  30 mL Oral Daily PRN [name], [name], MD      . nicotine (NICODERM CQ - dosed in mg/24 hours) patch 21 mg  21 mg Transdermal Daily [name], [name], MD      . OLANZapine (ZYPREXA) tablet 5 mg  5 mg Oral Q6H PRN [name], [name], MD      . paliperidone (INVEGA) 24 hr tablet 6 mg  6 mg Oral Daily [name], [name], MD   6 mg at 08/18/17 0830  . traZODone (DESYREL) tablet 100 mg  100 mg Oral QHS PRN [name], [name], MD   100 mg at 08/16/17 2157    Lab Results:  Results for orders placed or performed during the hospital encounter of 08/16/17 (from the past 48 hour(s))  Hemoglobin A1c     Status: None   Collection Time: 08/17/17  9:22 AM  Result Value Ref Range   Hgb A1c MFr Bld 5.4 4.8 - 5.6 %    Comment: (NOTE) Pre diabetes:          5.7%-6.4% Diabetes:              >6.4% Glycemic control for   <7.0% adults with diabetes    Mean Plasma Glucose 108.28 mg/dL    Comment: Performed at [facility], [place] [address], [place], [place]  [address]  Lipid panel     Status: None   Collection Time: 08/17/17  9:22 AM  Result Value Ref Range   Cholesterol 131 0 - 200 mg/dL   Triglycerides 111 <150 mg/dL   HDL 46 >40 mg/dL   Total CHOL/HDL Ratio 2.8 RATIO   VLDL 22 0 - 40 mg/dL   LDL Cholesterol 63 0 - 99 mg/dL    Comment:        Total Cholesterol/HDL:CHD Risk Coronary Heart Disease Risk Table                     Men   Women  1/2 Average Risk   3.4   3.3  Average Risk       5.0   4.4  2 X Average Risk   9.6   7.1  3 X Average Risk  23.4   11.0        Use the calculated Patient Ratio above and the CHD Risk Table to determine the patient's CHD Risk.        ATP III CLASSIFICATION (LDL):  <100     mg/dL   Optimal  100-129  mg/dL   Near or Above                    Optimal  130-159  mg/dL   Borderline  160-189  mg/dL   High  >190     mg/dL   Very High Performed at [facility], [address] [place], [place] [address]   TSH     Status: None   Collection Time: 08/17/17  9:22 AM  Result Value Ref Range   TSH 1.058 0.350 - 4.500 uIU/mL    Comment: Performed by a 3rd Generation assay with a functional sensitivity of <=0.01 uIU/mL. Performed at [facility], [address] [place], [place] [address]   Valproic acid level     Status: None   Collection Time: 08/18/17 10:17 AM  Result Value Ref Range   Valproic Acid Lvl 55 50.0 - 100.0 ug/mL    Comment: Performed at [facility], [address], [place], [place] [address]    Blood Alcohol level:  Lab Results  Component Value Date   [facility] <10 08/15/2017   ETH <5 29/56/2130    Metabolic Disorder Labs: Lab Results  Component Value Date   HGBA1C 5.4 08/17/2017   MPG  108.28 08/17/2017   MPG 105.41 01/12/2017   No results found for: PROLACTIN Lab Results  Component Value Date   CHOL 131 08/17/2017   TRIG 111 08/17/2017   HDL 46 08/17/2017   CHOLHDL 2.8 08/17/2017   VLDL 22 08/17/2017   LDLCALC 63 08/17/2017   LDLCALC 88 01/12/2017     Physical Findings: AIMS: Facial and Oral Movements Muscles of Facial Expression: None, normal Lips and Perioral Area: None, normal Jaw: None, normal Tongue: None, normal,Extremity Movements Upper (arms, wrists, hands, fingers): None, normal Lower (legs, knees, ankles, toes): None, normal, Trunk Movements Neck, shoulders, hips: None, normal, Overall Severity Severity of abnormal movements (highest score from questions above): None, normal Incapacitation due to abnormal movements: None, normal Patient's awareness of abnormal movements (rate only patient's report): No Awareness, Dental Status Current problems with teeth and/or dentures?: No Does patient usually wear dentures?: No  CIWA:    COWS:     Musculoskeletal: Strength & Muscle Tone: within normal limits Gait & Station: normal Patient leans:   Psychiatric Specialty Exam: Physical Exam  Nursing note and vitals reviewed.   ROS  Blood pressure 113/76, pulse (!) 106, temperature 98.2 F (36.8 C), resp. rate 18, height 5\' 9"  (1.753 m), weight 77.6 kg (171 lb), SpO2 99 %.Body mass index is 25.25 kg/m.  General Appearance: Disheveled  Eye Contact:  Minimal  Speech:  Garbled  Volume:  Decreased  Mood:  anxious  Affect:  Labile  Thought Process:  Disorganized  Orientation:  Full (Time, Place, and Person)  Thought Content:  Illogical and Delusions  Suicidal Thoughts:  No  Homicidal Thoughts:  No  Memory:  Immediate;   Poor  Judgement:  Impaired  Insight:  poor  Psychomotor Activity:  Normal  Concentration:  Concentration: Poor  Recall:  Poor  Fund of Knowledge:  Poor  Language:  Poor  Akathisia:  No      Assets:  Resilience  ADL's:  Intact  Cognition:  Impaired,  Moderate  Sleep:  Number of Hours: 7.15         Treatment Plan Summary: 62 yo male admitted due to decompensation and refusing medications. He is calm but disorganized today. I spoke with his outpatient psychiatrist who agrees to starting  [place] with plan to give [place]. He requires LAI due to noncompliance.   Plan:  Schizoaffective disorder - increase Invega  , with plan to give Sustenna -Continue Depakote 500 mg BID. depakote level- 55  EKG reviewed QTc 378, Labs reviewed Will check UA  Dispo -He follows with [name]. He is his own guardian.    Observation Level/Precautions:  15 minute checks  Laboratory:  UA  Psychotherapy:    Medications:    Consultations:    Discharge Concerns:    Estimated LOS: 5-7 days  Other:     Physician Treatment Plan for Primary Diagnosis: Schizoaffective disorder, bipolar type ([place]) Long Term Goal(s): Improvement in symptoms so as ready for discharge  Short Term Goals: Compliance with prescribed medications will improve    I certify that inpatient services furnished can reasonably be expected to improve the patient's condition.       [name], MD 08/18/2017, 12:03 PM

## 2017-08-19 LAB — URINALYSIS, COMPLETE (UACMP) WITH MICROSCOPIC
BACTERIA UA: NONE SEEN
BILIRUBIN URINE: NEGATIVE
Glucose, UA: NEGATIVE mg/dL
HGB URINE DIPSTICK: NEGATIVE
KETONES UR: NEGATIVE mg/dL
LEUKOCYTES UA: NEGATIVE
Nitrite: NEGATIVE
Protein, ur: NEGATIVE mg/dL
Specific Gravity, Urine: 1.015 (ref 1.005–1.030)
pH: 5 (ref 5.0–8.0)

## 2017-08-19 NOTE — Plan of Care (Signed)
Patient is alert to himself and place. Patient has disorganized thinking; flight of ideas intermittently. Patient tried to explain to nurse he does not need his medications because God helps him, although patient is medication compliant. Patient Denies SI,HI and AVH. Patient stays in his room most of the day and comes out for meals. Patient is pleasant and has no concerns at this time. Nurse will continue to monitor. Safety checks will continue Q 15 minutes.

## 2017-08-19 NOTE — [facility] Group Notes (Signed)
LCSW Group Therapy Note 08/19/2017 1:15pm  Type of Therapy and Topic: Group Therapy: Feelings Around Returning Home & Establishing a Supportive Framework and Supporting Oneself When Supports Not Available  Participation Level: Did Not Attend  Description of Group:  Patients first processed thoughts and feelings about upcoming discharge. These included fears of upcoming changes, lack of change, new living environments, judgements and expectations from others and overall stigma of mental health issues. The group then discussed the definition of a supportive framework, what that looks and feels like, and how do to discern it from an unhealthy non-supportive network. The group identified different types of supports as well as what to do when your family/friends are less than helpful or unavailable  Therapeutic Goals  1. Patient will identify one healthy supportive network that they can use at discharge. 2. Patient will identify one factor of a supportive framework and how to tell it from an unhealthy network. 3. Patient able to identify one coping skill to use when they do not have positive supports from others. 4. Patient will demonstrate ability to communicate their needs through discussion and/or role plays.  Summary of Patient Progress:    Therapeutic Modalities Cognitive Behavioral Therapy Motivational Interviewing   Leander Tout  CUEBAS-COLON, LCSW 08/19/2017 12:18 PM

## 2017-08-19 NOTE — Plan of Care (Signed)
Pt. Presents with disorganized and tangential speech often, but can be logical during periods in conversation with this Probation officer. Pt. Eating good and sleeping good per reports. Pt. Verbalizes understanding of provided education. Pt. Compliant with medications. Pt. Denies SI/HI and contracts for safety. Pt. Can remain safe while on the unit.    Not Progressing Education: Will be free of psychotic symptoms 08/19/2017 0102 - Not Progressing by Reyes Ivan, RN   Progressing Education: Knowledge of the prescribed therapeutic regimen will improve 08/19/2017 0102 - Progressing by Reyes Ivan, RN Health Behavior/Discharge Planning: Compliance with prescribed medication regimen will improve 08/19/2017 0102 - Progressing by Reyes Ivan, RN Nutritional: Ability to achieve adequate nutritional intake will improve 08/19/2017 0102 - Progressing by Reyes Ivan, RN Safety: Ability to remain free from injury will improve 08/19/2017 0102 - Progressing by Reyes Ivan, RN Self-Concept: Ability to verbalize positive feelings about self will improve 08/19/2017 0102 - Progressing by Reyes Ivan, RN

## 2017-08-19 NOTE — Progress Notes (Signed)
D:Pt. Presents with disorganized thoughts and tangential speech often, but can be logical during periods in conversation with this Probation officer and our conversations were able to be productive. Pt. Eating good and sleeping good per reports. Pt. Denies SI/HI/AVH and contracts for safety. Pt. Can remain safe while on the unit. Pt. Overall mood is pleasant. Poor insight. Pt. Frequently observed this evening socializing with peers appropriately and enjoying television.    A: Q x 15 minute observation checks were completed for safety. Patient was provided with education. Patient was given scheduled medications. Patient was encourage to attend groups, participate in unit activities and continue with plan of care.   R:Patient is complaint with medication and unit procedures. Pt. Attends groups.              Precautionary checks every 15 minutes for safety maintained, room free of safety hazards, patient sustains no injury or falls during this shift.

## 2017-08-19 NOTE — [facility] Group Notes (Signed)
[facility] Group Notes:  (Nursing/MHT/Case Management/Adjunct)  Date:  08/19/2017  Time:  3:37 AM  Type of Therapy:  Group Therapy  Participation Level:  Active  Participation Quality:  Appropriate  Affect:  Appropriate  Cognitive:  Alert  Insight:  Good    Engagement in Group:  Engaged  Modes of Intervention:  Support  Summary of Progress/Problems:  Dustin [name] 08/19/2017, 3:37 AM

## 2017-08-19 NOTE — Progress Notes (Signed)
[facility] MD Progress Note  08/19/2017 12:19 PM Dustin [name]  MRN:  [id number] Subjective:   Pt in bed, no complaints, continue to be odd, bizarre, but med compliant, denies side effects. Denies AVH. Denies SI/HI. Principal Problem: Schizoaffective disorder, bipolar type ([place]) Diagnosis:   Patient Active Problem List   Diagnosis Date Noted  . Schizoaffective disorder, bipolar type ([place]) [F25.0] 01/24/2017  . Tardive dyskinesia [G24.01] 01/24/2017  . Tobacco use disorder [F17.200] 11/29/2016   Total Time spent with patient: 30 minutes  Past Psychiatric History: no ne winfo  Past Medical History:  Past Medical History:  Diagnosis Date  . Schizophrenia ([place])    History reviewed. No pertinent surgical history. Family History: History reviewed. No pertinent family history. Family Psychiatric  History: no new info Social History:  Social History   Substance and Sexual Activity  Alcohol Use Yes  . Alcohol/week: 4.2 oz  . Types: 7 Shots of liquor per week   Comment: 1 drink every night- gin     Social History   Substance and Sexual Activity  Drug Use No    Social History   Socioeconomic History  . Marital status: Single    Spouse name: None  . Number of children: None  . Years of education: None  . Highest education level: None  Social Needs  . Financial resource strain: None  . Food insecurity - worry: None  . Food insecurity - inability: None  . Transportation needs - medical: None  . Transportation needs - non-medical: None  Occupational History  . None  Tobacco Use  . Smoking status: Current Every Day Smoker    Packs/day: 0.50    Years: 15.00    Pack years: 7.50  . Smokeless tobacco: Never Used  Substance and Sexual Activity  . Alcohol use: Yes    Alcohol/week: 4.2 oz    Types: 7 Shots of liquor per week    Comment: 1 drink every night- gin  . Drug use: No  . Sexual activity: No    Birth control/protection: None  Other Topics Concern  . None  Social History  Narrative  . None   Additional Social History:    Pain Medications: none Prescriptions: noncompliant Over the Counter: none History of alcohol / drug use?: No history of alcohol / drug abuse Longest period of sobriety (when/how long): unknown Negative Consequences of Use: (none) Withdrawal Symptoms: (none)                    Sleep: Good  Appetite:  Good  Current Medications: Current Facility-Administered Medications  Medication Dose Route Frequency Provider Last Rate Last Dose  . acetaminophen (TYLENOL) tablet 650 mg  650 mg Oral Q6H PRN [name], [name], MD      . alum & mag hydroxide-simeth (MAALOX/MYLANTA) 200-200-20 MG/5ML suspension 30 mL  30 mL Oral Q4H PRN [name], [name], MD      . divalproex (DEPAKOTE) DR tablet 500 mg  500 mg Oral Q12H [name], [name], MD   500 mg at 08/19/17 0905  . hydrOXYzine (ATARAX/VISTARIL) tablet 50 mg  50 mg Oral TID PRN [name], [name], MD      . magnesium hydroxide (MILK OF MAGNESIA) suspension 30 mL  30 mL Oral Daily PRN [name], [name], MD      . nicotine (NICODERM CQ - dosed in mg/24 hours) patch 21 mg  21 mg Transdermal Daily [name], [name], MD      . OLANZapine (ZYPREXA) tablet  5 mg  5 mg Oral Q6H PRN [name], [name], MD      . paliperidone (INVEGA) 24 hr tablet 9 mg  9 mg Oral Daily [name], MD   9 mg at 08/19/17 0905  . traZODone (DESYREL) tablet 100 mg  100 mg Oral QHS PRN [name], [name], MD   100 mg at 08/16/17 2157    Lab Results:  Results for orders placed or performed during the hospital encounter of 08/16/17 (from the past 48 hour(s))  Valproic acid level     Status: None   Collection Time: 08/18/17 10:17 AM  Result Value Ref Range   Valproic Acid Lvl 55 50.0 - 100.0 ug/mL    Comment: Performed at [facility], [address] [place], [place] [address]    Blood Alcohol level:  Lab Results  Component Value Date   [facility] <10 08/15/2017   ETH <5 39/76/7341    Metabolic Disorder Labs: Lab Results   Component Value Date   HGBA1C 5.4 08/17/2017   MPG 108.28 08/17/2017   MPG 105.41 01/12/2017   No results found for: PROLACTIN Lab Results  Component Value Date   CHOL 131 08/17/2017   TRIG 111 08/17/2017   HDL 46 08/17/2017   CHOLHDL 2.8 08/17/2017   VLDL 22 08/17/2017   LDLCALC 63 08/17/2017   LDLCALC 88 01/12/2017    Physical Findings: AIMS: Facial and Oral Movements Muscles of Facial Expression: None, normal Lips and Perioral Area: None, normal Jaw: None, normal Tongue: None, normal,Extremity Movements Upper (arms, wrists, hands, fingers): None, normal Lower (legs, knees, ankles, toes): None, normal, Trunk Movements Neck, shoulders, hips: None, normal, Overall Severity Severity of abnormal movements (highest score from questions above): None, normal Incapacitation due to abnormal movements: None, normal Patient's awareness of abnormal movements (rate only patient's report): No Awareness, Dental Status Current problems with teeth and/or dentures?: No Does patient usually wear dentures?: No  CIWA:    COWS:     Musculoskeletal: Strength & Muscle Tone: within normal limits Gait & Station: normal Patient leans:   Psychiatric Specialty Exam: Physical Exam  Nursing note and vitals reviewed.   ROS  Blood pressure 90/68, pulse 100, temperature 98.4 F (36.9 C), resp. rate 18, height 5\' 9"  (1.753 m), weight 77.6 kg (171 lb), SpO2 100 %.Body mass index is 25.25 kg/m.  General Appearance: Disheveled  Eye Contact:  Minimal  Speech:  Garbled  Volume:  Decreased  Mood:  anxious  Affect:  Labile  Thought Process:  Disorganized  Orientation:  Full (Time, Place, and Person)  Thought Content:  Illogical and Delusions  Suicidal Thoughts:  No  Homicidal Thoughts:  No  Memory:  Immediate;   Poor  Judgement:  Impaired  Insight:  poor  Psychomotor Activity:  Normal  Concentration:  Concentration: Poor  Recall:  Poor  Fund of Knowledge:  Poor  Language:  Poor   Akathisia:  No      Assets:  Resilience  ADL's:  Intact  Cognition:  Impaired,  Moderate  Sleep:  Number of Hours: 7.15         Treatment Plan Summary: 62 yo male admitted due to decompensation and refusing medications. He is calm but disorganized today. I spoke with his outpatient psychiatrist who agrees to starting [place] with plan to give [place]. Plan LAI due to noncompliance.   Plan:  Schizoaffective disorder -  Invega  just increased, with plan to give Sust[place] -Continue Depakote 500 mg BID. depakote level- 55  EKG reviewed QTc 378, Labs reviewed Will check UA  Dispo -He follows with [name]. He is his own guardian.    Observation Level/Precautions:  15 minute checks  Laboratory:  UA  Psychotherapy:    Medications:    Consultations:    Discharge Concerns:    Estimated LOS: 5-7 days  Other:     Physician Treatment Plan for Primary Diagnosis: Schizoaffective disorder, bipolar type ([place]) Long Term Goal(s): Improvement in symptoms so as ready for discharge  Short Term Goals: Compliance with prescribed medications will improve    I certify that inpatient services furnished can reasonably be expected to improve the patient's condition.       [name], MD 08/19/2017, 12:19 PMPatient ID: Dustin [name], male   DOB: 20-Jul-1955, 62 y.o.   MRN: [id number]

## 2017-08-19 NOTE — Progress Notes (Addendum)
[facility] MD Progress Note  08/20/2017 4:56 PM NINA HOAR  MRN:  [id number]  Subjective:   Mr. Ramthun is still very delusional. He explains how his teeth are connected by veins to his brain but not to his ears. "no metal illness". He agreed to take [place] sustenna injection today.  Treatment plan. Continue oral invega and Depakote. Invega sustenna monthly.  Social/disposition. Follow up with [name].   Principal Problem: Schizoaffective disorder, bipolar type [facility]) Diagnosis:   Patient Active Problem List   Diagnosis Date Noted  . Schizoaffective disorder, bipolar type ([place]) [F25.0] 01/24/2017    Priority: High  . Tardive dyskinesia [G24.01] 01/24/2017  . Tobacco use disorder [F17.200] 11/29/2016   Total Time spent with patient: 30 minutes  Past Psychiatric History: schizophrenia.  Past Medical History:  Past Medical History:  Diagnosis Date  . Schizophrenia ([place])    History reviewed. No pertinent surgical history. Family History: History reviewed. No pertinent family history. Family Psychiatric  History: see H&P Social History:  Social History   Substance and Sexual Activity  Alcohol Use Yes  . Alcohol/week: 4.2 oz  . Types: 7 Shots of liquor per week   Comment: 1 drink every night- gin     Social History   Substance and Sexual Activity  Drug Use No    Social History   Socioeconomic History  . Marital status: Single    Spouse name: None  . Number of children: None  . Years of education: None  . Highest education level: None  Social Needs  . Financial resource strain: None  . Food insecurity - worry: None  . Food insecurity - inability: None  . Transportation needs - medical: None  . Transportation needs - non-medical: None  Occupational History  . None  Tobacco Use  . Smoking status: Current Every Day Smoker    Packs/day: 0.50    Years: 15.00    Pack years: 7.50  . Smokeless tobacco: Never Used  Substance and Sexual Activity  . Alcohol  use: Yes    Alcohol/week: 4.2 oz    Types: 7 Shots of liquor per week    Comment: 1 drink every night- gin  . Drug use: No  . Sexual activity: No    Birth control/protection: None  Other Topics Concern  . None  Social History Narrative  . None   Additional Social History:    Pain Medications: none Prescriptions: noncompliant Over the Counter: none History of alcohol / drug use?: No history of alcohol / drug abuse Longest period of sobriety (when/how long): unknown Negative Consequences of Use: (none) Withdrawal Symptoms: (none)                    Sleep: Fair  Appetite:  Fair  Current Medications: Current Facility-Administered Medications  Medication Dose Route Frequency Provider Last Rate Last Dose  . acetaminophen (TYLENOL) tablet 650 mg  650 mg Oral Q6H PRN [name], [name], MD      . alum & mag hydroxide-simeth (MAALOX/MYLANTA) 200-200-20 MG/5ML suspension 30 mL  30 mL Oral Q4H PRN [name], [name], MD      . divalproex (DEPAKOTE) DR tablet 500 mg  500 mg Oral Q12H [name], [name], MD   500 mg at 08/20/17 9242  . hydrOXYzine (ATARAX/VISTARIL) tablet 50 mg  50 mg Oral TID PRN [name], [name], MD      . magnesium hydroxide (MILK OF MAGNESIA) suspension 30 mL  30 mL Oral Daily PRN  [name], [name], MD      . nicotine (NICODERM CQ - dosed in mg/24 hours) patch 21 mg  21 mg Transdermal Daily [name], [name], MD      . OLANZapine (ZYPREXA) tablet 5 mg  5 mg Oral Q6H PRN [name], [name], MD      . paliperidone (INVEGA SUSTENNA) injection 234 mg  234 mg Intramuscular Q28 days Cohen Doleman B, MD      . paliperidone (INVEGA) 24 hr tablet 9 mg  9 mg Oral Daily [name], MD   9 mg at 08/20/17 0807  . traZODone (DESYREL) tablet 100 mg  100 mg Oral QHS PRN [name], [name], MD   100 mg at 08/19/17 2132    Lab Results:  Results for orders placed or performed during the hospital encounter of 08/16/17 (from the past 48 hour(s))  Urinalysis, Complete w Microscopic      Status: Abnormal   Collection Time: 08/19/17  3:30 PM  Result Value Ref Range   Color, Urine YELLOW (A) YELLOW   APPearance CLEAR (A) CLEAR   Specific Gravity, Urine 1.015 1.005 - 1.030   pH 5.0 5.0 - 8.0   Glucose, UA NEGATIVE NEGATIVE mg/dL   Hgb urine dipstick NEGATIVE NEGATIVE   Bilirubin Urine NEGATIVE NEGATIVE   Ketones, ur NEGATIVE NEGATIVE mg/dL   Protein, ur NEGATIVE NEGATIVE mg/dL   Nitrite NEGATIVE NEGATIVE   Leukocytes, UA NEGATIVE NEGATIVE   RBC / HPF 0-5 0 - 5 RBC/hpf   WBC, UA 0-5 0 - 5 WBC/hpf   Bacteria, UA NONE SEEN NONE SEEN   Squamous Epithelial / LPF 0-5 (A) NONE SEEN   Mucus PRESENT     Comment: Performed at [facility], [address] [place], [place] [address]    Blood Alcohol level:  Lab Results  Component Value Date   [facility] <10 08/15/2017   ETH <5 60/45/4098    Metabolic Disorder Labs: Lab Results  Component Value Date   HGBA1C 5.4 08/17/2017   MPG 108.28 08/17/2017   MPG 105.41 01/12/2017   No results found for: PROLACTIN Lab Results  Component Value Date   CHOL 131 08/17/2017   TRIG 111 08/17/2017   HDL 46 08/17/2017   CHOLHDL 2.8 08/17/2017   VLDL 22 08/17/2017   LDLCALC 63 08/17/2017   LDLCALC 88 01/12/2017    Physical Findings: AIMS: Facial and Oral Movements Muscles of Facial Expression: None, normal Lips and Perioral Area: None, normal Jaw: None, normal Tongue: None, normal,Extremity Movements Upper (arms, wrists, hands, fingers): None, normal Lower (legs, knees, ankles, toes): None, normal, Trunk Movements Neck, shoulders, hips: None, normal, Overall Severity Severity of abnormal movements (highest score from questions above): None, normal Incapacitation due to abnormal movements: None, normal Patient's awareness of abnormal movements (rate only patient's report): No Awareness, Dental Status Current problems with teeth and/or dentures?: No Does patient usually wear dentures?: No  CIWA:    COWS:      Musculoskeletal: Strength & Muscle Tone: within normal limits Gait & Station: normal Patient leans: N/A  Psychiatric Specialty Exam: Physical Exam  Nursing note and vitals reviewed. Psychiatric: His speech is normal. Judgment normal. His affect is blunt. He is withdrawn. Thought content is paranoid and delusional. Cognition and memory are normal.    Review of Systems  Neurological: Negative.   All other systems reviewed and are negative.   Blood pressure 115/84, pulse 68, temperature 98.2 F (36.8 C), temperature source Oral, resp. rate 18, height 5'  9" (1.753 m), weight 77.6 kg (171 lb), SpO2 100 %.Body mass index is 25.25 kg/m.  General Appearance: Fairly Groomed  Eye Contact:  Good  Speech:  Pressured  Volume:  Increased  Mood:  Irritable  Affect:  Congruent  Thought Process:  Goal Directed and Descriptions of Associations: Intact  Orientation:  Full (Time, Place, and Person)  Thought Content:  Delusions and Paranoid Ideation  Suicidal Thoughts:  No  Homicidal Thoughts:  No  Memory:  Immediate;   Fair Recent;   Fair Remote;   Fair  Judgement:  Poor  Insight:  Lacking  Psychomotor Activity:  Decreased  Concentration:  Concentration: Fair and Attention Span: Fair  Recall:  [employer] of Knowledge:  Fair  Language:  Fair  Akathisia:  No  Handed:  Right  AIMS (if indicated):     Assets:  Communication Skills Desire for Improvement Financial Resources/Insurance Housing Physical Health Resilience Social Support  ADL's:  Intact  Cognition:  WNL  Sleep:  Number of Hours: 6.45     Treatment Plan Summary: Daily contact with patient to assess and evaluate symptoms and progress in treatment and Medication management   62 yo male admitted due to decompensation and refusing medications. He is calm but disorganized today. I spoke with his outpatient psychiatrist who agrees to starting [place] with plan to give [place]. Plan LAI due to noncompliance.    Plan:  Schizoaffective disorder -  Invega  just increased, with plan to give Sustenna -Continue Depakote 500 mg BID. depakote level- 55 -start Invega sustenna 234 mg on 08/20/2017  EKG reviewed QTc 378, Labs reviewed Will check UA  Dispo -He follows with [name]. He is his own guardian.      Orson Slick, MD 08/20/2017, 4:56 PM

## 2017-08-19 NOTE — Progress Notes (Signed)
Patient is alert and orient to self. Patient is disorganized in his thinking abilities. Pt has flight of ideas. Pt is very delusional at times. Pt stated,"He has a Psychologist, educational in his teeth and when Probation officer talks it makes his eyes bulge out". Pt open his eyes as wide as he can to try to make them stick out. Pt is medication compliant. Remains on Q 15 mins safety rounds. Will cont to monitor pt.

## 2017-08-20 ENCOUNTER — Encounter: Payer: Self-pay | Admitting: Psychiatry

## 2017-08-20 MED ORDER — PALIPERIDONE PALMITATE 234 MG/1.5ML IM SUSP
234.0000 mg | INTRAMUSCULAR | Status: DC
  Administered 2017-08-22: 234 mg via INTRAMUSCULAR
  Filled 2017-08-20: qty 1.5

## 2017-08-20 NOTE — Plan of Care (Signed)
Patient is alert to self, place, and intermittently to situation. Patient continues to have disorganized thinking and flight on ideas. Patient has been more visual in the milieu  today; interacting with peers and staff. Patient states he had some trouble sleeping last night. Patient denies SI, HI and AVH. Nurse will continue to monitor. Safety checks will continue Q 15 minutes. Elimination: Will not experience complications related to bowel motility 08/20/2017 1609 - Progressing by Geraldo Docker, RN Will not experience complications related to urinary retention 08/20/2017 1609 - Progressing by Geraldo Docker, RN   Skin Integrity: Risk for impaired skin integrity will decrease 08/20/2017 1609 - Progressing by Geraldo Docker, RN   Activity: Will verbalize the importance of balancing activity with adequate rest periods 08/20/2017 1609 - Progressing by Geraldo Docker, RN   Education: Will be free of psychotic symptoms 08/20/2017 1609 - Progressing by Geraldo Docker, RN Knowledge of the prescribed therapeutic regimen will improve 08/20/2017 1609 - Progressing by Geraldo Docker, RN

## 2017-08-20 NOTE — Plan of Care (Signed)
  Progressing Elimination: Will not experience complications related to bowel motility 08/20/2017 0001 - Progressing by Corinna Capra, RN Will not experience complications related to urinary retention 08/20/2017 0001 - Progressing by Corinna Capra, RN Skin Integrity: Risk for impaired skin integrity will decrease 08/20/2017 0001 - Progressing by Corinna Capra, RN Activity: Will verbalize the importance of balancing activity with adequate rest periods 08/20/2017 0001 - Progressing by Corinna Capra, RN Education: Will be free of psychotic symptoms 08/20/2017 0001 - Progressing by Corinna Capra, RN Knowledge of the prescribed therapeutic regimen will improve 08/20/2017 0001 - Progressing by Corinna Capra, RN Coping: Ability to cope will improve 08/20/2017 0001 - Progressing by Corinna Capra, RN Ability to verbalize feelings will improve 08/20/2017 0001 - Progressing by Corinna Capra, RN Health Behavior/Discharge Planning: Compliance with prescribed medication regimen will improve 08/20/2017 0001 - Progressing by Corinna Capra, RN Nutritional: Ability to achieve adequate nutritional intake will improve 08/20/2017 0001 - Progressing by Corinna Capra, RN Role Relationship: Ability to communicate needs accurately will improve 08/20/2017 0001 - Progressing by Corinna Capra, RN Ability to interact with others will improve 08/20/2017 0001 - Progressing by Corinna Capra, RN Safety: Ability to redirect hostility and anger into socially appropriate behaviors will improve 08/20/2017 0001 - Progressing by Corinna Capra, RN Ability to remain free from injury will improve 08/20/2017 0001 - Progressing by Corinna Capra, RN Self-Care: Ability to participate in self-care as condition permits will improve 08/20/2017 0001 - Progressing by Corinna Capra, RN Self-Concept: Ability to verbalize positive feelings about self will improve 08/20/2017 0001 - Progressing by Corinna Capra,  RN

## 2017-08-20 NOTE — Plan of Care (Signed)
Patient is alert and oriented seen in the milieu area socializing with peers with out any issues, compliance with medication is  good, need no help with ADLs , patient participate regularly in scheduled activity. Patient describe good appetite consume more than 60% of meals and well hydrated, contract for safety of self and others , denies any suicide ideations and no signs of AVH noted, 15 minute safety checks is maintained. Progressing Elimination: Will not experience complications related to urinary retention 08/20/2017 2045 - Progressing by Clemens Catholic, RN Skin Integrity: Risk for impaired skin integrity will decrease 08/20/2017 2045 - Progressing by Clemens Catholic, RN Activity: Will verbalize the importance of balancing activity with adequate rest periods 08/20/2017 2045 - Progressing by Clemens Catholic, RN Education: Will be free of psychotic symptoms 08/20/2017 2045 - Progressing by Clemens Catholic, RN Knowledge of the prescribed therapeutic regimen will improve 08/20/2017 2045 - Progressing by Clemens Catholic, RN Coping: Ability to cope will improve 08/20/2017 2045 - Progressing by Clemens Catholic, RN Ability to verbalize feelings will improve 08/20/2017 2045 - Progressing by Clemens Catholic, RN Nutritional: Ability to achieve adequate nutritional intake will improve 08/20/2017 2045 - Progressing by Clemens Catholic, RN Safety: Ability to redirect hostility and anger into socially appropriate behaviors will improve 08/20/2017 2045 - Progressing by Clemens Catholic, RN Self-Care: Ability to participate in self-care as condition permits will improve 08/20/2017 2045 - Progressing by Clemens Catholic, RN Self-Concept: Ability to verbalize positive feelings about self will improve 08/20/2017 2045 - Progressing by Clemens Catholic, RN

## 2017-08-21 NOTE — [facility] Group Notes (Signed)
LCSW Group Therapy Note   08/20/2017 1:00pm   Type of Therapy and Topic:  Group Therapy:  Overcoming Obstacles   Participation Level:  Did Not Attend   Description of Group:    In this group patients will be encouraged to explore what they see as obstacles to their own wellness and recovery. They will be guided to discuss their thoughts, feelings, and behaviors related to these obstacles. The group will process together ways to cope with barriers, with attention given to specific choices patients can make. Each patient will be challenged to identify changes they are motivated to make in order to overcome their obstacles. This group will be process-oriented, with patients participating in exploration of their own experiences as well as giving and receiving support and challenge from other group members.   Therapeutic Goals: 1. Patient will identify personal and current obstacles as they relate to admission. 2. Patient will identify barriers that currently interfere with their wellness or overcoming obstacles.  3. Patient will identify feelings, thought process and behaviors related to these barriers. 4. Patient will identify two changes they are willing to make to overcome these obstacles:      Summary of Patient Progress      Therapeutic Modalities:   Cognitive Behavioral Therapy Solution Focused Therapy Motivational Interviewing Relapse Prevention Therapy  August Saucer, LCSW 08/21/2017 10:58 AM

## 2017-08-21 NOTE — B[facility] Group Notes (Signed)
[facility] Group Notes:  (Nursing/MHT/Case Management/Adjunct)  Date:  08/21/2017  Time:  4:11 PM  Type of Therapy:  Psychoeducational Skills  Participation Level:  Did Not Attend    Dustin [name] 08/21/2017, 4:11 PM

## 2017-08-21 NOTE — Progress Notes (Signed)
[facility] MD Progress Note  08/21/2017 11:29 AM Dustin [name]  MRN:  [id number]   Subjective:  Pt is slightly more organized today. He is not overtly delusional until the end of the interview. HE states that he has an Designer, fashion/clothing in my teeth to help me hear." He has been calm on the unit. He states that he feels good. 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 He does make a few bizarre hyperreligious statements at times but overall much better than on Friday when I saw him. He is eating well and interacting well with peers.   Principal Problem: Schizoaffective disorder, bipolar type ([place]) Diagnosis:   Patient Active Problem List   Diagnosis Date Noted  . Schizoaffective disorder, bipolar type ([place]) [F25.0] 01/24/2017    Priority: High  . Tardive dyskinesia [G24.01] 01/24/2017  . Tobacco use disorder [F17.200] 11/29/2016   Total Time spent with patient: 20 minutes  Past Psychiatric History: See H&P  Past Medical History:  Past Medical History:  Diagnosis Date  . Schizophrenia ([place])    History reviewed. No pertinent surgical history. Family History: History reviewed. No pertinent family history. Family Psychiatric  History: See H&P Social History:  Social History   Substance and Sexual Activity  Alcohol Use Yes  . Alcohol/week: 4.2 oz  . Types: 7 Shots of liquor per week   Comment: 1 drink every night- gin     Social History   Substance and Sexual Activity  Drug Use No    Social History   Socioeconomic History  . Marital status: Single    Spouse name: None  . Number of children: None  . Years of education: None  . Highest education level: None  Social Needs  . Financial resource strain: None  . Food insecurity - worry: None  . Food insecurity - inability: None  . Transportation needs - medical: None  . Transportation needs - non-medical: None  Occupational History  . None  Tobacco Use  . Smoking status: Current Every Day  Smoker    Packs/day: 0.50    Years: 15.00    Pack years: 7.50  . Smokeless tobacco: Never Used  Substance and Sexual Activity  . Alcohol use: Yes    Alcohol/week: 4.2 oz    Types: 7 Shots of liquor per week    Comment: 1 drink every night- gin  . Drug use: No  . Sexual activity: No    Birth control/protection: None  Other Topics Concern  . None  Social History Narrative  . None   Additional Social History:    Pain Medications: none Prescriptions: noncompliant Over the Counter: none History of alcohol / drug use?: No history of alcohol / drug abuse Longest period of sobriety (when/how long): unknown Negative Consequences of Use: (none) Withdrawal Symptoms: (none)                    Sleep: Fair  Appetite:  Good  Current Medications: Current Facility-Administered Medications  Medication Dose Route Frequency Provider Last Rate Last Dose  . acetaminophen (TYLENOL) tablet 650 mg  650 mg Oral Q6H PRN [name], [name], MD      . alum & mag hydroxide-simeth (MAALOX/MYLANTA) 200-200-20 MG/5ML suspension 30 mL  30 mL Oral Q4H PRN [name], [name], MD      . divalproex (DEPAKOTE) DR tablet 500 mg  500 mg Oral Q12H [name], [name], MD   500 mg at  08/21/17 0828  . hydrOXYzine (ATARAX/VISTARIL) tablet 50 mg  50 mg Oral TID PRN [name], [name], MD      . magnesium hydroxide (MILK OF MAGNESIA) suspension 30 mL  30 mL Oral Daily PRN [name], [name], MD      . nicotine (NICODERM CQ - dosed in mg/24 hours) patch 21 mg  21 mg Transdermal Daily [name], [name], MD   21 mg at 08/21/17 5638  . OLANZapine (ZYPREXA) tablet 5 mg  5 mg Oral Q6H PRN Saif Peter R, MD      . paliperidone (INVEGA SUSTENNA) injection 234 mg  234 mg Intramuscular Q28 days Pucilowska, Jolanta B, MD      . paliperidone (INVEGA) 24 hr tablet 9 mg  9 mg Oral Daily [name], MD   9 mg at 08/21/17 0828  . traZODone (DESYREL) tablet 100 mg  100 mg Oral QHS PRN [name], [name], MD   100 mg at 08/19/17 2132     Lab Results:  Results for orders placed or performed during the hospital encounter of 08/16/17 (from the past 48 hour(s))  Urinalysis, Complete w Microscopic     Status: Abnormal   Collection Time: 08/19/17  3:30 PM  Result Value Ref Range   Color, Urine YELLOW (A) YELLOW   APPearance CLEAR (A) CLEAR   Specific Gravity, Urine 1.015 1.005 - 1.030   pH 5.0 5.0 - 8.0   Glucose, UA NEGATIVE NEGATIVE mg/dL   Hgb urine dipstick NEGATIVE NEGATIVE   Bilirubin Urine NEGATIVE NEGATIVE   Ketones, ur NEGATIVE NEGATIVE mg/dL   Protein, ur NEGATIVE NEGATIVE mg/dL   Nitrite NEGATIVE NEGATIVE   Leukocytes, UA NEGATIVE NEGATIVE   RBC / HPF 0-5 0 - 5 RBC/hpf   WBC, UA 0-5 0 - 5 WBC/hpf   Bacteria, UA NONE SEEN NONE SEEN   Squamous Epithelial / LPF 0-5 (A) NONE SEEN   Mucus PRESENT     Comment: Performed at [facility], [address] [place], [place] [address]    Blood Alcohol level:  Lab Results  Component Value Date   [facility] <10 08/15/2017   ETH <5 28/76/8115    Metabolic Disorder Labs: Lab Results  Component Value Date   HGBA1C 5.4 08/17/2017   MPG 108.28 08/17/2017   MPG 105.41 01/12/2017   No results found for: PROLACTIN Lab Results  Component Value Date   CHOL 131 08/17/2017   TRIG 111 08/17/2017   HDL 46 08/17/2017   CHOLHDL 2.8 08/17/2017   VLDL 22 08/17/2017   LDLCALC 63 08/17/2017   LDLCALC 88 01/12/2017    Physical Findings: AIMS: Facial and Oral Movements Muscles of Facial Expression: None, normal Lips and Perioral Area: None, normal Jaw: None, normal Tongue: None, normal,Extremity Movements Upper (arms, wrists, hands, fingers): None, normal Lower (legs, knees, ankles, toes): None, normal, Trunk Movements Neck, shoulders, hips: None, normal, Overall Severity Severity of abnormal movements (highest score from questions above): None, normal Incapacitation due to abnormal movements: None, normal Patient's awareness of abnormal movements (rate only  patient's report): No Awareness, Dental Status Current problems with teeth and/or dentures?: No Does patient usually wear dentures?: No  CIWA:    COWS:     Musculoskeletal: Strength & Muscle Tone: within normal limits Gait & Station: normal Patient leans: N/A  Psychiatric Specialty Exam: Physical Exam  Nursing note and vitals reviewed.   Review of Systems  All other systems reviewed and are negative.   Blood pressure 112/71, pulse 78, temperature  98.3 F (36.8 C), temperature source Oral, resp. rate 18, height 5\' 9"  (1.753 m), weight 77.6 kg (171 lb), SpO2 100 %.Body mass index is 25.25 kg/m.  General Appearance: Casual  Eye Contact:  Good  Speech:  Clear and Coherent  Volume:  Normal  Mood:  Euthymic  Affect:  Appropriate  Thought Process:  Disorganized at times  Orientation:  Full (Time, Place, and Person)  Thought Content:  Delusions  Suicidal Thoughts:  No  Homicidal Thoughts:  No  Memory:  Immediate;   Fair  Judgement:  Impaired  Insight:  Lacking  Psychomotor Activity:  Normal  Concentration:  Concentration: Fair  Recall:  [employer] of Knowledge:  Fair  Language:  Fair  Akathisia:  No      Assets:  Resilience  ADL's:  Intact  Cognition:  WNL  Sleep:  Number of Hours: 7.45     Treatment Plan Summary: 62 yo male admitted due to psychosis and decompensation. He is still delusional and bizarre at times but is slightly improved from Friday. More organized in conversation. He is caring for his ADLs. He has been compliant with medications. He was to receive initial injection of Invega yesterday but our pharmacy did not have it in stock.   Plan:  Schizophrenia -Continue oral Invega. Will give initial injection today once pharmacy restocks -Increase Depakote to 500 mg qam and 1000 mg qhs. Depakote level was 55  UA negative for UTI  He follows with [name]. He is his own guardian.   Marylin Crosby, MD 08/21/2017, 11:30 AM

## 2017-08-21 NOTE — Plan of Care (Signed)
  Voice no concerns around his bowels or urine . Patient indicated  no problems with his skin. Appetite good and voice no concerns around activity on  floor . Thought process remains altered. Instruction given on medication  in concrete form for better understanding . Continue to work on  Radiographer, therapeutic  Limited interaction  with peers   Progressing Elimination: Will not experience complications related to bowel motility 08/21/2017 1242 - Progressing by Leodis Liverpool, RN Will not experience complications related to urinary retention 08/21/2017 1242 - Progressing by Leodis Liverpool, RN Skin Integrity: Risk for impaired skin integrity will decrease 08/21/2017 1242 - Progressing by Leodis Liverpool, RN Activity: Will verbalize the importance of balancing activity with adequate rest periods 08/21/2017 1242 - Progressing by Leodis Liverpool, RN Education: Will be free of psychotic symptoms 08/21/2017 1242 - Progressing by Leodis Liverpool, RN Knowledge of the prescribed therapeutic regimen will improve 08/21/2017 1242 - Progressing by Leodis Liverpool, RN Coping: Ability to cope will improve 08/21/2017 1242 - Progressing by Leodis Liverpool, RN Ability to verbalize feelings will improve 08/21/2017 1242 - Progressing by Leodis Liverpool, [place] Behavior/Discharge Planning: Compliance with prescribed medication regimen will improve 08/21/2017 1242 - Progressing by Leodis Liverpool, RN Nutritional: Ability to achieve adequate nutritional intake will improve 08/21/2017 1242 - Progressing by Leodis Liverpool, RN Role Relationship: Ability to communicate needs accurately will improve 08/21/2017 1242 - Progressing by Leodis Liverpool, RN Ability to interact with others will improve 08/21/2017 1242 - Progressing by Leodis Liverpool, RN Safety: Ability to redirect hostility and anger into socially appropriate behaviors will improve 08/21/2017 1242 - Progressing by Leodis Liverpool, RN Ability to remain free from  injury will improve 08/21/2017 1242 - Progressing by Leodis Liverpool, RN Self-Care: Ability to participate in self-care as condition permits will improve 08/21/2017 1242 - Progressing by Leodis Liverpool, RN Self-Concept: Ability to verbalize positive feelings about self will improve 08/21/2017 1242 - Progressing by Leodis Liverpool, RN

## 2017-08-21 NOTE — [facility] Group Notes (Signed)
  08/21/2017  Time: 0900  Type of Therapy and Topic: Group Therapy: Goals Group: SMART Goals   Participation Level:  Did Not Attend   Description of Group:   The purpose of a daily goals group is to assist and guide patients in setting recovery/wellness-related goals. The objective is to set goals as they relate to the crisis in which they were admitted. Patients will be using SMART goal modalities to set measurable goals. Characteristics of realistic goals will be discussed and patients will be assisted in setting and processing how one will reach their goal. Facilitator will also assist patients in applying interventions and coping skills learned in psycho-education groups to the SMART goal and process how one will achieve defined goal.   Therapeutic Goals:  -Patients will develop and document one goal related to or their crisis in which brought them into treatment.  -Patients will be guided by LCSW using SMART goal setting modality in how to set a measurable, attainable, realistic and time sensitive goal.  -Patients will process barriers in reaching goal.  -Patients will process interventions in how to overcome and successful in reaching goal.   Patient's Goal: Pt was invited to attend group but chose not to attend. CSW will continue to encourage pt to attend group throughout their admission.   Therapeutic Modalities:  Motivational Interviewing  Cognitive Behavioral Therapy  Crisis Intervention Model  SMART goals setting   Alden Hipp, MSW, LCSW 08/21/2017 9:27 AM

## 2017-08-21 NOTE — [facility] Group Notes (Signed)
08/21/2017 1PM  Type of Therapy/Topic:  Group Therapy:  Feelings about Diagnosis  Participation Level:  Did Not Attend   Description of Group:   This group will allow patients to explore their thoughts and feelings about diagnoses they have received. Patients will be guided to explore their level of understanding and acceptance of these diagnoses. Facilitator will encourage patients to process their thoughts and feelings about the reactions of others to their diagnosis and will guide patients in identifying ways to discuss their diagnosis with significant others in their lives. This group will be process-oriented, with patients participating in exploration of their own experiences, giving and receiving support, and processing challenge from other group members.   Therapeutic Goals: 1. Patient will demonstrate understanding of diagnosis as evidenced by identifying two or more symptoms of the disorder 2. Patient will be able to express two feelings regarding the diagnosis 3. Patient will demonstrate their ability to communicate their needs through discussion and/or role play  Summary of Patient Progress: Patient was encouraged and invited to attend group. Patient did not attend group. Social worker will continue to encourage group participation in the future.       Therapeutic Modalities:   Cognitive Behavioral Therapy Brief Therapy Feelings Identification    Darin Engels, LCSW 08/21/2017 1:57 PM

## 2017-08-21 NOTE — Progress Notes (Addendum)
Voice no concerns around his bowels or urine . Patient indicated  no problems with his skin. Appetite good and voice no concerns around activity on  floor . Thought process remains altered. Instruction given on medication  in concrete form for better understanding . Continue to work on  Tour manager  with peer Limited  Interacting with peers and staff.  Responding to internal stimulation A: Encourage patient participation with unit programming . Instruction  Given on  Medication , verbalize understanding. R: Voice no other concerns. Staff continue to monitor

## 2017-08-21 NOTE — [facility] Group Notes (Signed)
[place] Group Notes:  (Nursing/MHT/Case Management/Adjunct)  Date:  08/21/2017  Time:  11:04 PM  Type of Therapy:  Group Therapy  Participation Level:  Minimal  Participation Quality:  Appropriate  Affect:  Appropriate  Cognitive:  Alert  Insight:  Good  Engagement in Group:  Engaged  Modes of Intervention:  Support  Summary of Progress/Problems:  Dustin [name] 08/21/2017, 11:04 PM

## 2017-08-22 MED ORDER — PALIPERIDONE PALMITATE 156 MG/ML IM SUSP
156.0000 mg | Freq: Once | INTRAMUSCULAR | Status: DC
  Filled 2017-08-22: qty 1

## 2017-08-22 MED ORDER — TRAZODONE HCL 50 MG PO TABS
50.0000 mg | ORAL_TABLET | Freq: Every evening | ORAL | Status: DC | PRN
  Administered 2017-08-27 – 2017-08-28 (×2): 50 mg via ORAL
  Filled 2017-08-22 (×3): qty 1

## 2017-08-22 NOTE — [facility] Group Notes (Signed)
  08/22/2017  Time: 1PM  Type of Therapy/Topic:  Group Therapy:  Emotion Regulation  Participation Level:  Minimal   Description of Group:    The purpose of this group is to assist patients in learning to regulate negative emotions and experience positive emotions. Patients will be guided to discuss ways in which they have been vulnerable to their negative emotions. These vulnerabilities will be juxtaposed with experiences of positive emotions or situations, and patients will be challenged to use positive emotions to combat negative ones. Special emphasis will be placed on coping with negative emotions in conflict situations, and patients will process healthy conflict resolution skills.  Therapeutic Goals: 1. Patient will identify two positive emotions or experiences to reflect on in order to balance out negative emotions 2. Patient will label two or more emotions that they find the most difficult to experience 3. Patient will demonstrate positive conflict resolution skills through discussion and/or role plays  Summary of Patient Progress: Pt continues to work towards their tx goals but has not yet reached them. Pt attended group but did not participate unless directly prompted by CSW. Pt reported not having "any coping skills," he can utilize to better manage his emotions. Pt also stated he has a difficult time appropriately expressing his anxiety.    Therapeutic Modalities:   Cognitive Behavioral Therapy Feelings Identification Dialectical Behavioral Therapy  Alden Hipp, MSW, LCSW 08/22/2017 1:52 PM

## 2017-08-22 NOTE — Tx Team (Signed)
Interdisciplinary Treatment and Diagnostic Plan Update  08/22/2017 Time of Session: 11am Dustin [name] MRN: [id number]  Principal Diagnosis: Schizoaffective disorder, bipolar type [facility])  Secondary Diagnoses: Principal Problem:   Schizoaffective disorder, bipolar type ([place]) Active Problems:   Tobacco use disorder   Tardive dyskinesia   Current Medications:  Current Facility-Administered Medications  Medication Dose Route Frequency Provider Last Rate Last Dose  . acetaminophen (TYLENOL) tablet 650 mg  650 mg Oral Q6H PRN [name], [name], MD      . alum & mag hydroxide-simeth (MAALOX/MYLANTA) 200-200-20 MG/5ML suspension 30 mL  30 mL Oral Q4H PRN [name], [name], MD      . divalproex (DEPAKOTE) DR tablet 500 mg  500 mg Oral Q12H [name], [name], MD   500 mg at 08/22/17 0905  . hydrOXYzine (ATARAX/VISTARIL) tablet 50 mg  50 mg Oral TID PRN [name], [name], MD      . magnesium hydroxide (MILK OF MAGNESIA) suspension 30 mL  30 mL Oral Daily PRN [name], [name], MD      . nicotine (NICODERM CQ - dosed in mg/24 hours) patch 21 mg  21 mg Transdermal Daily [name], [name], MD   21 mg at 08/22/17 0901  . OLANZapine (ZYPREXA) tablet 5 mg  5 mg Oral Q6H PRN [name], [name], MD      . Derrill Memo ON 08/27/2017] paliperidone (INVEGA SUSTENNA) injection 156 mg  156 mg Intramuscular Once [name], [name], MD      . paliperidone (INVEGA) 24 hr tablet 9 mg  9 mg Oral Daily [name], MD   9 mg at 08/22/17 0900  . traZODone (DESYREL) tablet 50 mg  50 mg Oral QHS PRN [name], [name], MD       PTA Medications: Medications Prior to Admission  Medication Sig Dispense Refill Last Dose  . fluPHENAZine decanoate (PROLIXIN) 25 MG/ML injection Inject 2 mLs (50 mg total) into the muscle every 21 ( twenty-one) days. 2 mL 0 Past Month at Unknown time  . ARIPiprazole (ABILIFY) 15 MG tablet Take 1 tablet (15 mg total) by mouth daily. (Patient not taking: Reported on 08/16/2017) 30 tablet 0 Not Taking at Unknown time  .  divalproex (DEPAKOTE) 500 MG DR tablet Take 1 tablet (500 mg total) by mouth every 12 (twelve) hours. (Patient not taking: Reported on 08/16/2017) 60 tablet 0 Not Taking at Unknown time  . zolpidem (AMBIEN) 10 MG tablet Take 1 tablet (10 mg total) by mouth at bedtime as needed for sleep. (Patient not taking: Reported on 08/16/2017) 30 tablet 0 Not Taking at Unknown time    Patient Stressors: Medication change or noncompliance  Patient Strengths: Average or above average intelligence Communication skills Physical Health  Treatment Modalities: Medication Management, Group therapy, Case management,  1 to 1 session with clinician, Psychoeducation, Recreational therapy.   Physician Treatment Plan for Primary Diagnosis: Schizoaffective disorder, bipolar type ([place]) Long Term Goal(s): Improvement in symptoms so as ready for discharge   Short Term Goals: Compliance with prescribed medications will improve  Medication Management: Evaluate patient's response, side effects, and tolerance of medication regimen.  Therapeutic Interventions: 1 to 1 sessions, Unit Group sessions and Medication administration.  Evaluation of Outcomes: Progressing  Physician Treatment Plan for Secondary Diagnosis: Principal Problem:   Schizoaffective disorder, bipolar type ([place]) Active Problems:   Tobacco use disorder   Tardive dyskinesia  Long Term Goal(s): Improvement in symptoms so as ready for discharge   Short Term Goals: Compliance with prescribed medications  will improve     Medication Management: Evaluate patient's response, side effects, and tolerance of medication regimen.  Therapeutic Interventions: 1 to 1 sessions, Unit Group sessions and Medication administration.  Evaluation of Outcomes: Progressing   RN Treatment Plan for Primary Diagnosis: Schizoaffective disorder, bipolar type ([place]) Long Term Goal(s): Knowledge of disease and therapeutic regimen to maintain health will improve  Short Term  Goals: Ability to demonstrate self-control, Ability to participate in decision making will improve, Ability to identify and develop effective coping behaviors will improve and Compliance with prescribed medications will improve  Medication Management: RN will administer medications as ordered by provider, will assess and evaluate patient's response and provide education to patient for prescribed medication. RN will report any adverse and/or side effects to prescribing provider.  Therapeutic Interventions: 1 on 1 counseling sessions, Psychoeducation, Medication administration, Evaluate responses to treatment, Monitor vital signs and CBGs as ordered, Perform/monitor CIWA, COWS, AIMS and Fall Risk screenings as ordered, Perform wound care treatments as ordered.  Evaluation of Outcomes: Progressing   LCSW Treatment Plan for Primary Diagnosis: Schizoaffective disorder, bipolar type ([place]) Long Term Goal(s): Safe transition to appropriate next level of care at discharge, Engage patient in therapeutic group addressing interpersonal concerns.  Short Term Goals: Engage patient in aftercare planning with referrals and resources, Facilitate acceptance of mental health diagnosis and concerns, Facilitate patient progression through stages of change regarding substance use diagnoses and concerns, Identify triggers associated with mental health/substance abuse issues and Increase skills for wellness and recovery  Therapeutic Interventions: Assess for all discharge needs, 1 to 1 time with Social worker, Explore available resources and support systems, Assess for adequacy in community support network, Educate family and significant other(s) on suicide prevention, Complete Psychosocial Assessment, Interpersonal group therapy.  Evaluation of Outcomes: Progressing   Progress in Treatment: Attending groups: Yes. Participating in groups: Yes. Taking medication as prescribed: Yes. Toleration medication:  Yes. Family/Significant other contact made: Yes, individual(s) contacted:  Patients Brother Dustin [name] Patient understands diagnosis: Yes. Discussing patient identified problems/goals with staff: Yes. Medical problems stabilized or resolved: Yes. Denies suicidal/homicidal ideation: Yes. Issues/concerns per patient self-inventory: No. Other:  New problem(s) identified: No, Describe:  None  New Short Term/Long Term Goal(s): "To get out of here."  Discharge Plan or Barriers: . At discharge, patient will return back home with his brother and continue to follow up with his ACTT Team.   Reason for Continuation of Hospitalization: Medication stabilization  Estimated Length of Stay: 7 days  Attendees: Patient: Dustin [name] 08/22/2017 11:20 AM  Physician: Amador Cunas, MD 08/22/2017 11:20 AM  Nursing: Eugenio Hoes, RN 08/22/2017 11:20 AM  RN Care Manager: 08/22/2017 11:20 AM  Social Worker: Darin Engels, [place] 08/22/2017 11:20 AM  Recreational Therapist:  08/22/2017 11:20 AM  Other:  08/22/2017 11:20 AM  Other:  08/22/2017 11:20 AM  Other: 08/22/2017 11:20 AM    Scribe for Treatment Team: Darin Engels, LCSW 08/22/2017 11:20 AM

## 2017-08-22 NOTE — Plan of Care (Signed)
Patient is seen in the day room visibly socializing with peers , affect and mood is bright, patient is aware of copping skills and compliance with therapeutic regimen, denies any thought of SI/HI, remain safe in the unit and contract for safety of self and others, patient voice no concerns at this time 15 minute safety check is maintained. Progressing Elimination: Will not experience complications related to bowel motility 08/22/2017 0339 - Progressing by Clemens Catholic, RN Will not experience complications related to urinary retention 08/22/2017 0339 - Progressing by Clemens Catholic, RN Skin Integrity: Risk for impaired skin integrity will decrease 08/22/2017 0339 - Progressing by Clemens Catholic, RN Activity: Will verbalize the importance of balancing activity with adequate rest periods 08/22/2017 0339 - Progressing by Clemens Catholic, RN Education: Knowledge of the prescribed therapeutic regimen will improve 08/22/2017 0339 - Progressing by Clemens Catholic, RN Coping: Ability to cope will improve 08/22/2017 0339 - Progressing by Clemens Catholic, RN Ability to verbalize feelings will improve 08/22/2017 0339 - Progressing by Clemens Catholic, RN Nutritional: Ability to achieve adequate nutritional intake will improve 08/22/2017 0339 - Progressing by Clemens Catholic, RN Role Relationship: Ability to communicate needs accurately will improve 08/22/2017 0339 - Progressing by Clemens Catholic, RN Safety: Ability to redirect hostility and anger into socially appropriate behaviors will improve 08/22/2017 0339 - Progressing by Clemens Catholic, RN Ability to remain free from injury will improve 08/22/2017 0339 - Progressing by Clemens Catholic, RN Self-Concept: Ability to verbalize positive feelings about self will improve 08/22/2017 0339 - Progressing by Clemens Catholic, RN

## 2017-08-22 NOTE — Progress Notes (Signed)
D:Patient indicated  no problems with his skin. Appetite good and voice no concerns around activity on unit . Thought process remains altered. Instruction given on medication  in concrete form for better understanding . Continue to work on  Radiographer, therapeutic  Limited interaction  with peers Voice no concerns around his bowels or urine . Patient non compliant  With ADL'S , encourage  Patient not  participation  With unit programing .  Auditory hallucinations responding to internal stimuli   No pain concerns   A: Encourage patient participation with unit programming . Instruction  Given on  Medication , verbalize understanding.  R: Voice no other concerns. Staff continue to monitor

## 2017-08-22 NOTE — Progress Notes (Signed)
[facility] MD Progress Note  08/22/2017 10:36 AM Dustin [name]  MRN:  [id number]   Subjective:  Pt has been very calm on the unit. He came to my office today and asked about his release and when he will be discharged. He is overall more organized in conversation but still bizarre and delusional at times but is not perseverative on delusions. HE is also much less hyperreligious. He still believes he has steel tooth that is "an [employer] computer system to help me hear." He denies AH other than "I hear your voice in my mind." He denies SI or HI. He states that his mind is "peaceful and gentle." He is able to have a more appropriate conversation although bizarre and incoherent at times. He refuses to go to a group home and is currently living with his brother who is deaf. He would like to eventually get his own place. He is oriented to "March 2019", "[place]" and "trump" as president. He has better hygiene. He did receive initial [place] injection this morning with no incidents.   Principal Problem: Schizoaffective disorder, bipolar type ([place]) Diagnosis:   Patient Active Problem List   Diagnosis Date Noted  . Schizoaffective disorder, bipolar type ([place]) [F25.0] 01/24/2017    Priority: High  . Tardive dyskinesia [G24.01] 01/24/2017  . Tobacco use disorder [F17.200] 11/29/2016   Total Time spent with patient: 20 minutes  Past Psychiatric History: See H&P  Past Medical History:  Past Medical History:  Diagnosis Date  . Schizophrenia ([place])    History reviewed. No pertinent surgical history. Family History: History reviewed. No pertinent family history. Family Psychiatric  History: See H&P Social History:  Social History   Substance and Sexual Activity  Alcohol Use Yes  . Alcohol/week: 4.2 oz  . Types: 7 Shots of liquor per week   Comment: 1 drink every night- gin     Social History   Substance and Sexual Activity  Drug Use No    Social History   Socioeconomic History   . Marital status: Single    Spouse name: None  . Number of children: None  . Years of education: None  . Highest education level: None  Social Needs  . Financial resource strain: None  . Food insecurity - worry: None  . Food insecurity - inability: None  . Transportation needs - medical: None  . Transportation needs - non-medical: None  Occupational History  . None  Tobacco Use  . Smoking status: Current Every Day Smoker    Packs/day: 0.50    Years: 15.00    Pack years: 7.50  . Smokeless tobacco: Never Used  Substance and Sexual Activity  . Alcohol use: Yes    Alcohol/week: 4.2 oz    Types: 7 Shots of liquor per week    Comment: 1 drink every night- gin  . Drug use: No  . Sexual activity: No    Birth control/protection: None  Other Topics Concern  . None  Social History Narrative  . None   Additional Social History:    Pain Medications: none Prescriptions: noncompliant Over the Counter: none History of alcohol / drug use?: No history of alcohol / drug abuse Longest period of sobriety (when/how long): unknown Negative Consequences of Use: (none) Withdrawal Symptoms: (none)                    Sleep: Poor-"I'm not in my own bed to sleep."   Appetite:  Good  Current Medications: Current  Facility-Administered Medications  Medication Dose Route Frequency Provider Last Rate Last Dose  . acetaminophen (TYLENOL) tablet 650 mg  650 mg Oral Q6H PRN [name], [name], MD      . alum & mag hydroxide-simeth (MAALOX/MYLANTA) 200-200-20 MG/5ML suspension 30 mL  30 mL Oral Q4H PRN [name], [name], MD      . divalproex (DEPAKOTE) DR tablet 500 mg  500 mg Oral Q12H [name], [name], MD   500 mg at 08/22/17 0905  . hydrOXYzine (ATARAX/VISTARIL) tablet 50 mg  50 mg Oral TID PRN [name], [name], MD      . magnesium hydroxide (MILK OF MAGNESIA) suspension 30 mL  30 mL Oral Daily PRN [name], [name], MD      . nicotine (NICODERM CQ - dosed in mg/24 hours) patch 21 mg  21 mg  Transdermal Daily [name], [name], MD   21 mg at 08/22/17 0901  . OLANZapine (ZYPREXA) tablet 5 mg  5 mg Oral Q6H PRN Reyan Helle, [name], MD      . Derrill Memo ON 08/27/2017] paliperidone (INVEGA SUSTENNA) injection 156 mg  156 mg Intramuscular Once Kaylyn Garrow R, MD      . paliperidone (INVEGA) 24 hr tablet 9 mg  9 mg Oral Daily [name], MD   9 mg at 08/22/17 0900  . traZODone (DESYREL) tablet 100 mg  100 mg Oral QHS PRN [name], [name], MD   100 mg at 08/21/17 2129    Lab Results: No results found for this or any previous visit (from the past 48 hour(s)).  Blood Alcohol level:  Lab Results  Component Value Date   ETH <10 08/15/2017   ETH <5 32/67/1245    Metabolic Disorder Labs: Lab Results  Component Value Date   HGBA1C 5.4 08/17/2017   MPG 108.28 08/17/2017   MPG 105.41 01/12/2017   No results found for: PROLACTIN Lab Results  Component Value Date   CHOL 131 08/17/2017   TRIG 111 08/17/2017   HDL 46 08/17/2017   CHOLHDL 2.8 08/17/2017   VLDL 22 08/17/2017   LDLCALC 63 08/17/2017   LDLCALC 88 01/12/2017    Physical Findings: AIMS: Facial and Oral Movements Muscles of Facial Expression: None, normal Lips and Perioral Area: None, normal Jaw: None, normal Tongue: None, normal,Extremity Movements Upper (arms, wrists, hands, fingers): None, normal Lower (legs, knees, ankles, toes): None, normal, Trunk Movements Neck, shoulders, hips: None, normal, Overall Severity Severity of abnormal movements (highest score from questions above): None, normal Incapacitation due to abnormal movements: None, normal Patient's awareness of abnormal movements (rate only patient's report): No Awareness, Dental Status Current problems with teeth and/or dentures?: No Does patient usually wear dentures?: No  CIWA:    COWS:     Musculoskeletal: Strength & Muscle Tone: within normal limits Gait & Station: normal Patient leans: N/A  Psychiatric Specialty Exam: Physical Exam  ROS   Blood pressure (!) 109/59, pulse 86, temperature 97.6 F (36.4 C), temperature source Oral, resp. rate 18, height 5\' 9"  (1.753 m), weight 77.6 kg (171 lb), SpO2 99 %.Body mass index is 25.25 kg/m.  General Appearance: Casual  Eye Contact:  Good  Speech:  Clear and Coherent, rambling at times  Volume:  Normal  Mood:  Euthymic  Affect:  Appropriate  Thought Process:  Goal Directed for the majority of the time, some rambling thoughts  Orientation:  Full (Time, Place, and Person)  Thought Content:  Delusions  Suicidal Thoughts:  No  Homicidal Thoughts:  No  Memory:  Immediate;   Fair  Judgement:  Fair  Insight:  Fair  Psychomotor Activity:  Normal  Concentration:  Concentration: Fair  Recall:  [employer] of Knowledge:  Fair  Language:  Fair  Akathisia:  No      Assets:  Resilience  ADL's:  Intact  Cognition:  WNL  Sleep:  Number of Hours: 7     Treatment Plan Summary: 62 yo male admitted due to agitation and decompensation at home. He was started [employer] oral and did receive initial injection of 234 mg this morning. He is overall more organized but still bizarre and delusional at times. HE has not had any agitation or outburst on the unit.   Plan:  Schizophrenia -Continue Oral Invega for now. He received initial [place] injection today -Continue Depakote 500 mg BID -Pt will receive second Invega injection next week  Dispo-pt follows with [name]. He is his own guardian. He has a payee   Marylin Crosby, MD 08/22/2017, 10:36 AM

## 2017-08-22 NOTE — Plan of Care (Signed)
   Patient indicated  no problems with his skin. Appetite good and voice no concerns around activity on unit . Thought process remains altered. Instruction given on medication  in concrete form for better understanding . Continue to work on  Radiographer, therapeutic  Limited interaction  with peers Voice no concerns around his bowels or urine .   Progressing Elimination: Will not experience complications related to bowel motility 08/22/2017 1200 - Progressing by Leodis Liverpool, RN Will not experience complications related to urinary retention 08/22/2017 1200 - Progressing by Leodis Liverpool, RN Skin Integrity: Risk for impaired skin integrity will decrease 08/22/2017 1200 - Progressing by Leodis Liverpool, RN Activity: Will verbalize the importance of balancing activity with adequate rest periods 08/22/2017 1200 - Progressing by Leodis Liverpool, RN Education: Will be free of psychotic symptoms 08/22/2017 1200 - Progressing by Leodis Liverpool, RN Knowledge of the prescribed therapeutic regimen will improve 08/22/2017 1200 - Progressing by Leodis Liverpool, RN Coping: Ability to cope will improve 08/22/2017 1200 - Progressing by Leodis Liverpool, RN Ability to verbalize feelings will improve 08/22/2017 1200 - Progressing by Leodis Liverpool, [place] Behavior/Discharge Planning: Compliance with prescribed medication regimen will improve 08/22/2017 1200 - Progressing by Leodis Liverpool, RN Nutritional: Ability to achieve adequate nutritional intake will improve 08/22/2017 1200 - Progressing by Leodis Liverpool, RN Role Relationship: Ability to communicate needs accurately will improve 08/22/2017 1200 - Progressing by Leodis Liverpool, RN Ability to interact with others will improve 08/22/2017 1200 - Progressing by Leodis Liverpool, RN Safety: Ability to redirect hostility and anger into socially appropriate behaviors will improve 08/22/2017 1200 - Progressing by Leodis Liverpool, RN Ability to remain free from  injury will improve 08/22/2017 1200 - Progressing by Leodis Liverpool, RN Self-Care: Ability to participate in self-care as condition permits will improve 08/22/2017 1200 - Progressing by Leodis Liverpool, RN Self-Concept: Ability to verbalize positive feelings about self will improve 08/22/2017 1200 - Progressing by Leodis Liverpool, RN

## 2017-08-23 NOTE — BHH Group Notes (Signed)
[place] Group Notes:  (Nursing/MHT/Case Management/Adjunct)  Date:  08/23/2017  Time:  10:42 PM  Type of Therapy:  Group Therapy  Participation Level:  Active  Participation Quality:  Supportive  Affect:  Appropriate  Cognitive:  Alert  Insight:  Good  Engagement in Group:  Engaged  Modes of Intervention:  Support  Summary of Progress/Problems:  Dustin [name] 08/23/2017, 10:42 PM

## 2017-08-23 NOTE — Plan of Care (Signed)
Patient is seen in the milieu area with peers interacting with out any issues, patient affect and mood is bright, takes his medicines without any noticeable side effects , patient voice no concerns, contract for safety of self and other, appetite is good and well hydrated. 15 minute rounding is maintained  no distress noted at this time. Progressing Skin Integrity: Risk for impaired skin integrity will decrease 08/23/2017 0037 - Progressing by Clemens Catholic, RN Activity: Will verbalize the importance of balancing activity with adequate rest periods 08/23/2017 0037 - Progressing by Clemens Catholic, RN Education: Will be free of psychotic symptoms 08/23/2017 0037 - Progressing by Clemens Catholic, RN Knowledge of the prescribed therapeutic regimen will improve 08/23/2017 0037 - Progressing by Clemens Catholic, RN Coping: Ability to cope will improve 08/23/2017 0037 - Progressing by Clemens Catholic, RN Ability to verbalize feelings will improve 08/23/2017 0037 - Progressing by Clemens Catholic, RN Health Behavior/Discharge Planning: Compliance with prescribed medication regimen will improve 08/23/2017 0037 - Progressing by Clemens Catholic, RN Nutritional: Ability to achieve adequate nutritional intake will improve 08/23/2017 0037 - Progressing by Clemens Catholic, RN Role Relationship: Ability to communicate needs accurately will improve 08/23/2017 0037 - Progressing by Clemens Catholic, RN Safety: Ability to remain free from injury will improve 08/23/2017 0037 - Progressing by Clemens Catholic, RN Self-Care: Ability to participate in self-care as condition permits will improve 08/23/2017 0037 - Progressing by Clemens Catholic, RN

## 2017-08-23 NOTE — Progress Notes (Signed)
Received Baran this am after breakfast, he was compliant with his medications. He denied all of the psychiatric symptoms this am. He returned to his bedroom after breakfast. He has been visible in the milieu at intervals without incident.

## 2017-08-23 NOTE — BHH Counselor (Signed)
CSW spoke with patients brother Cartier Washko [phone]. CSW informed the brother that the patient is inquiring about clothing and asking if he can bring him some things. The brother states that he plans to come see him tonight and will bring him some clothing. CSW also updated the patients brother on the patients discharge plans and answered any additional questions that he had.  Darin Engels, MSW, [place], LCASA 08/23/2017 11:38 AM

## 2017-08-23 NOTE — BHH Group Notes (Signed)
LCSW Group Therapy Note  08/23/2017 1:00 pm  Type of Therapy/Topic:  Group Therapy:  Balance in Life  Participation Level:  Minimal  Description of Group:    This group will address the concept of balance and how it feels and looks when one is unbalanced. Patients will be encouraged to process areas in their lives that are out of balance and identify reasons for remaining unbalanced. Facilitators will guide patients in utilizing problem-solving interventions to address and correct the stressor making their life unbalanced. Understanding and applying boundaries will be explored and addressed for obtaining and maintaining a balanced life. Patients will be encouraged to explore ways to assertively make their unbalanced needs known to significant others in their lives, using other group members and facilitator for support and feedback.  Therapeutic Goals: 1. Patient will identify two or more emotions or situations they have that consume much of in their lives. 2. Patient will identify signs/triggers that life has become out of balance:  3. Patient will identify two ways to set boundaries in order to achieve balance in their lives:  4. Patient will demonstrate ability to communicate their needs through discussion and/or role plays  Summary of Patient Progress:  Khizar presented late to group and only participated minimally before leaving after about 10 minutes.  Jamaree did shared that it has been a very long time since he has felt balance in his life.  Boubacar shared that he has been unbalanced for a long time which has included feelings of hopeless and loss of his job, family, and housing.      Therapeutic Modalities:   Cognitive Behavioral Therapy Solution-Focused Therapy Assertiveness Training  Devona Konig, [place] 08/23/2017 3:24 PM

## 2017-08-23 NOTE — BHH Group Notes (Signed)
  08/23/2017 9am  Type of Therapy and Topic: Group Therapy: Goals Group: SMART Goals   Participation Level: Did Not Attend  Description of Group:    The purpose of a daily goals group is to assist and guide patients in setting recovery/wellness-related goals. The objective is to set goals as they relate to the crisis in which they were admitted. Patients will be using SMART goal modalities to set measurable goals. Characteristics of realistic goals will be discussed and patients will be assisted in setting and processing how one will reach their goal. Facilitator will also assist patients in applying interventions and coping skills learned in psycho-education groups to the SMART goal and process how one will achieve defined goal.   Therapeutic Goals:   -Patients will develop and document one goal related to or their crisis in which brought them into treatment.  -Patients will be guided by LCSW using SMART goal setting modality in how to set a measurable, attainable, realistic and time sensitive goal.  -Patients will process barriers in reaching goal.  -Patients will process interventions in how to overcome and successful in reaching goal.   Patient's Goal: Patient was encouraged and invited to attend group. Patient did not attend group. Social worker will continue to encourage group participation in the future.    Therapeutic Modalities:  Motivational Interviewing  Cognitive Behavioral Therapy  Crisis Intervention Model  SMART goals setting  Darin Engels, [place] 08/23/2017 9:36 AM

## 2017-08-23 NOTE — Progress Notes (Signed)
Patient ID: Dustin [name], male   DOB: 12/09/1955, 62 y.o.   MRN: [id number] Quiet, polite, reading The Bible in his room, pleasant on approach, appropriate, no odd and bizarre behaviors, denied pain, denied SI/HI/AVH.

## 2017-08-23 NOTE — Progress Notes (Signed)
[facility] MD Progress Note  08/23/2017 2:59 PM Dustin [name]  MRN:  [id number]   Subjective:  Pt states that he is feeling well today. He has been calm on the unit. HE is still bizarre and hyper-religious at times but much less perseverative on this. HE is really wanting his brother to bring him clothes. We have not been able to reach his brother. Pt reports sleeping well and feeling calm. He is eating well. HE has been compliant with medications. He denies SI, HI, AH, VH. He has been caring for his ADLs.   Principal Problem: Schizoaffective disorder, bipolar type ([place]) Diagnosis:   Patient Active Problem List   Diagnosis Date Noted  . Schizoaffective disorder, bipolar type ([place]) [F25.0] 01/24/2017    Priority: High  . Tardive dyskinesia [G24.01] 01/24/2017  . Tobacco use disorder [F17.200] 11/29/2016   Total Time spent with patient: 15 minutes  Past Psychiatric History: See H&P  Past Medical History:  Past Medical History:  Diagnosis Date  . Schizophrenia ([place])    History reviewed. No pertinent surgical history. Family History: History reviewed. No pertinent family history. Family Psychiatric  History: See H&P Social History:  Social History   Substance and Sexual Activity  Alcohol Use Yes  . Alcohol/week: 4.2 oz  . Types: 7 Shots of liquor per week   Comment: 1 drink every night- gin     Social History   Substance and Sexual Activity  Drug Use No    Social History   Socioeconomic History  . Marital status: Single    Spouse name: Not on file  . Number of children: Not on file  . Years of education: Not on file  . Highest education level: Not on file  Occupational History  . Not on file  Social Needs  . Financial resource strain: Not on file  . Food insecurity:    Worry: Not on file    Inability: Not on file  . Transportation needs:    Medical: Not on file    Non-medical: Not on file  Tobacco Use  . Smoking status: Current Every Day Smoker    Packs/day: 0.50   Years: 15.00    Pack years: 7.50  . Smokeless tobacco: Never Used  Substance and Sexual Activity  . Alcohol use: Yes    Alcohol/week: 4.2 oz    Types: 7 Shots of liquor per week    Comment: 1 drink every night- gin  . Drug use: No  . Sexual activity: Never    Birth control/protection: None  Lifestyle  . Physical activity:    Days per week: Not on file    Minutes per session: Not on file  . Stress: Not on file  Relationships  . Social connections:    Talks on phone: Not on file    Gets together: Not on file    Attends religious service: Not on file    Active member of club or organization: Not on file    Attends meetings of clubs or organizations: Not on file    Relationship status: Not on file  Other Topics Concern  . Not on file  Social History Narrative  . Not on file   Additional Social History:    Pain Medications: none Prescriptions: noncompliant Over the Counter: none History of alcohol / drug use?: No history of alcohol / drug abuse Longest period of sobriety (when/how long): unknown Negative Consequences of Use: (none) Withdrawal Symptoms: (none)  Sleep: Good  Appetite:  Fair  Current Medications: Current Facility-Administered Medications  Medication Dose Route Frequency Provider Last Rate Last Dose  . acetaminophen (TYLENOL) tablet 650 mg  650 mg Oral Q6H PRN [name], [name], MD      . alum & mag hydroxide-simeth (MAALOX/MYLANTA) 200-200-20 MG/5ML suspension 30 mL  30 mL Oral Q4H PRN [name], [name], MD      . divalproex (DEPAKOTE) DR tablet 500 mg  500 mg Oral Q12H [name], [name], MD   500 mg at 08/23/17 0913  . hydrOXYzine (ATARAX/VISTARIL) tablet 50 mg  50 mg Oral TID PRN [name], [name], MD      . magnesium hydroxide (MILK OF MAGNESIA) suspension 30 mL  30 mL Oral Daily PRN [name], [name], MD      . nicotine (NICODERM CQ - dosed in mg/24 hours) patch 21 mg  21 mg Transdermal Daily [name], [name], MD   21 mg at 08/22/17 0901   . OLANZapine (ZYPREXA) tablet 5 mg  5 mg Oral Q6H PRN Christopher Glasscock, [name], MD      . Derrill Memo ON 08/27/2017] paliperidone (INVEGA SUSTENNA) injection 156 mg  156 mg Intramuscular Once Anikka Marsan R, MD      . paliperidone (INVEGA) 24 hr tablet 9 mg  9 mg Oral Daily [name], MD   9 mg at 08/23/17 0913  . traZODone (DESYREL) tablet 50 mg  50 mg Oral QHS PRN Naysha Sholl, [name], MD        Lab Results: No results found for this or any previous visit (from the past 48 hour(s)).  Blood Alcohol level:  Lab Results  Component Value Date   ETH <10 08/15/2017   ETH <5 14/48/1856    Metabolic Disorder Labs: Lab Results  Component Value Date   HGBA1C 5.4 08/17/2017   MPG 108.28 08/17/2017   MPG 105.41 01/12/2017   No results found for: PROLACTIN Lab Results  Component Value Date   CHOL 131 08/17/2017   TRIG 111 08/17/2017   HDL 46 08/17/2017   CHOLHDL 2.8 08/17/2017   VLDL 22 08/17/2017   LDLCALC 63 08/17/2017   LDLCALC 88 01/12/2017    Physical Findings: AIMS: Facial and Oral Movements Muscles of Facial Expression: None, normal Lips and Perioral Area: None, normal Jaw: None, normal Tongue: None, normal,Extremity Movements Upper (arms, wrists, hands, fingers): None, normal Lower (legs, knees, ankles, toes): None, normal, Trunk Movements Neck, shoulders, hips: None, normal, Overall Severity Severity of abnormal movements (highest score from questions above): None, normal Incapacitation due to abnormal movements: None, normal Patient's awareness of abnormal movements (rate only patient's report): No Awareness, Dental Status Current problems with teeth and/or dentures?: No Does patient usually wear dentures?: No  CIWA:    COWS:     Musculoskeletal: Strength & Muscle Tone: within normal limits Gait & Station: normal Patient leans: N/A  Psychiatric Specialty Exam: Physical Exam  Nursing note and vitals reviewed.   Review of Systems  All other systems reviewed and are  negative.   Blood pressure (!) 116/51, pulse 70, temperature (!) 97.3 F (36.3 C), temperature source Oral, resp. rate 18, height 5\' 9"  (1.753 m), weight 77.6 kg (171 lb), SpO2 99 %.Body mass index is 25.25 kg/m.  General Appearance: Casual  Eye Contact:  Good  Speech:  Garbled  Volume:  Decreased  Mood:  Euthymic  Affect:  Congruent  Thought Process:  Goal Directed, some bizarre and hyperreligious comments  Orientation:  Full (Time, Place,  and Person)  Thought Content:  Illogical  Suicidal Thoughts:  No  Homicidal Thoughts:  No  Memory:  Immediate;   Fair  Judgement:  Impaired  Insight:  Lacking  Psychomotor Activity:  Normal  Concentration:  Concentration: Fair  Recall:  [employer] of Knowledge:  Fair  Language:  Fair  Akathisia:  No      Assets:  Resilience  ADL's:  Intact  Cognition:  WNL  Sleep:  Number of Hours: 6.3     Treatment Plan Summary:  62 yo male admitted due to agitation and decompensation at home. He was started on Invega oral and did receive initial injection of 234 mg this morning. He is overall more organized but still bizarre and delusional at times. HE has not had any agitation or outburst on the unit.   Schizophrenia -He received initial [place] injection on 3/20. Next injection will be given on 3/26 or 3/27 -Continue Depakote 500 mg BID  Dispo -He follows with [name]. He is his own guardian. He would like to return to live with his brother on discharge   Marylin Crosby, MD 08/23/2017, 2:59 PM

## 2017-08-24 MED ORDER — PALIPERIDONE PALMITATE 156 MG/ML IM SUSP
156.0000 mg | Freq: Once | INTRAMUSCULAR | Status: AC
  Administered 2017-08-29: 156 mg via INTRAMUSCULAR
  Filled 2017-08-24 (×2): qty 1

## 2017-08-24 NOTE — Plan of Care (Signed)
No psychotic symptoms observed this evening. Pt. Verbalizes understanding of provided education. Pt. Compliant with medications. Pt. Eating good per reports. Pt. Verbalizes he can remain safe while on the unit. Pt. Denies SI/HI. Pt. Contracts for safety.    Problem: Education: Goal: Will be free of psychotic symptoms Outcome: Progressing Goal: Knowledge of the prescribed therapeutic regimen will improve Outcome: Progressing   Problem: Health Behavior/Discharge Planning: Goal: Compliance with prescribed medication regimen will improve Outcome: Progressing   Problem: Nutritional: Goal: Ability to achieve adequate nutritional intake will improve Outcome: Progressing   Problem: Safety: Goal: Ability to remain free from injury will improve Outcome: Progressing

## 2017-08-24 NOTE — Progress Notes (Signed)
Received Plumer this am after breakfast, he was compliant with his medications and denied all of the psychiatric symptoms. He denied feeling suicidal. He has been visible in the milieu at intervals throughout the day. His status is unchanged this PM.

## 2017-08-24 NOTE — [facility] Group Notes (Signed)
LCSW Group Therapy Note  08/24/2017 9:30 AM  Type of Therapy and Topic:  Group Therapy:  Feelings around Relapse and Recovery  Participation Level:  None   Description of Group:    Patients in this group will discuss emotions they experience before and after a relapse. They will process how experiencing these feelings, or avoidance of experiencing them, relates to having a relapse. Facilitator will guide patients to explore emotions they have related to recovery. Patients will be encouraged to process which emotions are more powerful. They will be guided to discuss the emotional reaction significant others in their lives may have to their relapse or recovery. Patients will be assisted in exploring ways to respond to the emotions of others without this contributing to a relapse.  Therapeutic Goals: 1. Patient will identify two or more emotions that lead to a relapse for them 2. Patient will identify two emotions that result when they relapse 3. Patient will identify two emotions related to recovery 4. Patient will demonstrate ability to communicate their needs through discussion and/or role plays   Summary of Patient Progress:  Dow chose not to actively participate in today's group discussion even when prompted by CSW to share.     Therapeutic Modalities:   Cognitive Behavioral Therapy Solution-Focused Therapy Assertiveness Training Relapse Prevention Therapy   Devona Konig, LCSW 08/24/2017 2:12 PM

## 2017-08-24 NOTE — Progress Notes (Signed)
[facility] MD Progress Note  08/24/2017 12:47 PM Dustin [name]  MRN:  [id number]   Subjective:  Pt has been very calm and cooperative on the unit. No bizzare behaviors noted by staff. He is reading the bible upon interview today. He is overall organized in thought process. Much less hyperreligious and bizzare. He asks appropriate questions about his medications. He denies SI, HI, AH, VH. No unsafe behaviors noted. Hygiene is good and hopes his brother will bring him some extra clothes tonight.   Principal Problem: Schizoaffective disorder, bipolar type ([place]) Diagnosis:   Patient Active Problem List   Diagnosis Date Noted  . Schizoaffective disorder, bipolar type ([place]) [F25.0] 01/24/2017    Priority: High  . Tardive dyskinesia [G24.01] 01/24/2017  . Tobacco use disorder [F17.200] 11/29/2016   Total Time spent with patient: 20 minutes  Past Psychiatric History: See H&P  Past Medical History:  Past Medical History:  Diagnosis Date  . Schizophrenia ([place])    History reviewed. No pertinent surgical history. Family History: History reviewed. No pertinent family history. Family Psychiatric  History: See H&P Social History:  Social History   Substance and Sexual Activity  Alcohol Use Yes  . Alcohol/week: 4.2 oz  . Types: 7 Shots of liquor per week   Comment: 1 drink every night- gin     Social History   Substance and Sexual Activity  Drug Use No    Social History   Socioeconomic History  . Marital status: Single    Spouse name: Not on file  . Number of children: Not on file  . Years of education: Not on file  . Highest education level: Not on file  Occupational History  . Not on file  Social Needs  . Financial resource strain: Not on file  . Food insecurity:    Worry: Not on file    Inability: Not on file  . Transportation needs:    Medical: Not on file    Non-medical: Not on file  Tobacco Use  . Smoking status: Current Every Day Smoker    Packs/day: 0.50    Years: 15.00     Pack years: 7.50  . Smokeless tobacco: Never Used  Substance and Sexual Activity  . Alcohol use: Yes    Alcohol/week: 4.2 oz    Types: 7 Shots of liquor per week    Comment: 1 drink every night- gin  . Drug use: No  . Sexual activity: Never    Birth control/protection: None  Lifestyle  . Physical activity:    Days per week: Not on file    Minutes per session: Not on file  . Stress: Not on file  Relationships  . Social connections:    Talks on phone: Not on file    Gets together: Not on file    Attends religious service: Not on file    Active member of club or organization: Not on file    Attends meetings of clubs or organizations: Not on file    Relationship status: Not on file  Other Topics Concern  . Not on file  Social History Narrative  . Not on file   Additional Social History:    Pain Medications: none Prescriptions: noncompliant Over the Counter: none History of alcohol / drug use?: No history of alcohol / drug abuse Longest period of sobriety (when/how long): unknown Negative Consequences of Use: (none) Withdrawal Symptoms: (none)  Sleep: Good  Appetite:  Good  Current Medications: Current Facility-Administered Medications  Medication Dose Route Frequency Provider Last Rate Last Dose  . acetaminophen (TYLENOL) tablet 650 mg  650 mg Oral Q6H PRN [name], [name], MD      . alum & mag hydroxide-simeth (MAALOX/MYLANTA) 200-200-20 MG/5ML suspension 30 mL  30 mL Oral Q4H PRN [name], [name], MD      . divalproex (DEPAKOTE) DR tablet 500 mg  500 mg Oral Q12H [name], [name], MD   500 mg at 08/24/17 9924  . hydrOXYzine (ATARAX/VISTARIL) tablet 50 mg  50 mg Oral TID PRN [name], [name], MD      . magnesium hydroxide (MILK OF MAGNESIA) suspension 30 mL  30 mL Oral Daily PRN [name], [name], MD      . nicotine (NICODERM CQ - dosed in mg/24 hours) patch 21 mg  21 mg Transdermal Daily [name], [name], MD   21 mg at 08/22/17 0901  .  OLANZapine (ZYPREXA) tablet 5 mg  5 mg Oral Q6H PRN Kimbra Marcelino, [name], MD      . Derrill Memo ON 08/27/2017] paliperidone (INVEGA SUSTENNA) injection 156 mg  156 mg Intramuscular Once Yarissa Reining R, MD      . paliperidone (INVEGA) 24 hr tablet 9 mg  9 mg Oral Daily [name], MD   9 mg at 08/24/17 2683  . traZODone (DESYREL) tablet 50 mg  50 mg Oral QHS PRN Lerry Cordrey, [name], MD        Lab Results: No results found for this or any previous visit (from the past 48 hour(s)).  Blood Alcohol level:  Lab Results  Component Value Date   ETH <10 08/15/2017   ETH <5 41/96/2229    Metabolic Disorder Labs: Lab Results  Component Value Date   HGBA1C 5.4 08/17/2017   MPG 108.28 08/17/2017   MPG 105.41 01/12/2017   No results found for: PROLACTIN Lab Results  Component Value Date   CHOL 131 08/17/2017   TRIG 111 08/17/2017   HDL 46 08/17/2017   CHOLHDL 2.8 08/17/2017   VLDL 22 08/17/2017   LDLCALC 63 08/17/2017   LDLCALC 88 01/12/2017    Physical Findings: AIMS: Facial and Oral Movements Muscles of Facial Expression: None, normal Lips and Perioral Area: None, normal Jaw: None, normal Tongue: None, normal,Extremity Movements Upper (arms, wrists, hands, fingers): None, normal Lower (legs, knees, ankles, toes): None, normal, Trunk Movements Neck, shoulders, hips: None, normal, Overall Severity Severity of abnormal movements (highest score from questions above): None, normal Incapacitation due to abnormal movements: None, normal Patient's awareness of abnormal movements (rate only patient's report): No Awareness, Dental Status Current problems with teeth and/or dentures?: No Does patient usually wear dentures?: No  CIWA:    COWS:     Musculoskeletal: Strength & Muscle Tone: within normal limits Gait & Station: normal Patient leans: N/A  Psychiatric Specialty Exam: Physical Exam  Nursing note and vitals reviewed.   ROS  Blood pressure 98/74, pulse 89, temperature 97.6 F (36.4  C), temperature source Oral, resp. rate 18, height 5\' 9"  (1.753 m), weight 77.6 kg (171 lb), SpO2 100 %.Body mass index is 25.25 kg/m.  General Appearance: Casual  Eye Contact:  Good  Speech:  Clear and Coherent  Volume:  Normal  Mood:  Euthymic  Affect:  Appropriate  Thought Process:  Coherent  Orientation:  Full (Time, Place, and Person)  Thought Content:  Logical  Suicidal Thoughts:  No  Homicidal Thoughts:  No  Memory:  Immediate;   Fair  Judgement:  Fair  Insight:  Fair  Psychomotor Activity:  Normal  Concentration:  Concentration: Fair  Recall:  [employer] of Knowledge:  Fair  Language:  Fair  Akathisia:  No      Assets:  Communication Skills Resilience  ADL's:  Intact  Cognition:  WNL  Sleep:  Number of Hours: 4     Treatment Plan Summary: 62 yo male admitted due to agitation and decompensation at home. He was started on Invega oral and did receive initial injection of 234 mg this morning. He is overall more organized and no delusions today.  HE has not had any agitation or outburst on the unit.   Schizophrenia -He received initial [place] injection on 3/20. Next injection will be given on 3/26 and will stop oral Invega -Continue Depakote 500 mg BID  Dispo -He follows with [name]. He is his own guardian. He would like to return to live with his brother on discharge    Marylin Crosby, MD 08/24/2017, 12:47 PM

## 2017-08-24 NOTE — Progress Notes (Signed)
D:Pt denies SI/HI/AVH. Pt. Contracts for safety. Pt is pleasant and cooperative. Pt. has no Complaints.  Patient Interaction is engaging. No psychotic symptoms observed this evening. Pt. Verbalizes understanding of provided education. Pt Eating good per reports.  A: Q x 15 minute observation checks were completed for safety. Patient was provided with education. Patient was given scheduled medications. Patient  was encourage to attend groups, participate in unit activities and continue with plan of care.   R:Patient is complaint with medication and unit procedures. Did not go to groups. Pt. Frequently observed in the milieu around peers and watching tv this evening.             Precautionary checks every 15 minutes for safety maintained, room free of safety hazards, patient sustains no injury or falls during this shift.

## 2017-08-24 NOTE — Plan of Care (Signed)
Patient slept for Estimated Hours of 4; Precautionary checks every 15 minutes for safety maintained, room free of safety hazards, patient sustains no injury or falls during this shift.

## 2017-08-25 NOTE — [facility] Group Notes (Signed)
LCSW Group Therapy Note  08/25/2017 1:15pm  Type of Therapy and Topic:  Group Therapy:  Healthy Self Image and Positive Change  Participation Level:  Did Not Attend   Description of Group:  In this group, patients will compare and contrast their current "I am...." statements to the visions they identify as desirable for their lives.  Patients discuss fears and how they can make positive changes in their cognitions that will positively impact their behaviors.  Facilitator played a motivational 3-minute speech and patients were left with the task of thinking about what "I am...." statements they can start using in their lives immediately.  Therapeutic Goals: 1. Patient will state their current self-perception as expressed in an "I Am" statement 2. Patient will contrast this with their desired vision for their live 3. Patient will identify 3 fears that negatively impact their behavior 4. Patient will discuss cognitive distortions that stem from their fears 5. Patient will verbalize statements that challenge their cognitive distortions  Summary of Patient Progress:  Pt was invited to group but did not attend.    Therapeutic Modalities Cognitive Behavioral Therapy Motivational Interviewing  Rahim Astorga  CUEBAS-COLON, LCSW 08/25/2017 12:20 PM

## 2017-08-25 NOTE — Progress Notes (Signed)
Received Dustin [name] this am in the dining room after breakfast, he was compliant with his medications. He denied all of the psychiatric symptoms including suicidal. He remained OOB in the milieu, socializing with his peers and attending the group therapy sessions. His brother arrived for a visit and brought him clothes.

## 2017-08-25 NOTE — Progress Notes (Signed)
[facility] MD Progress Note  08/25/2017 2:12 PM Dustin [name]  MRN:  [id number]   Subjective:  Pt is laying in bed but he is wide awake.  "I am happy, I am always happy."  He rambles but overall he is organized.  He requests staff contact his brother to bring him a jean jacket and some underwear.  He asks appropriate questions.  Denies si/hi/avh.  Seems like him and his roommate get along.  Principal Problem: Schizoaffective disorder, bipolar type (Dustin [name]) Diagnosis:   Patient Active Problem List   Diagnosis Date Noted  . Schizoaffective disorder, bipolar type (Dustin [name]) [F25.0] 01/24/2017  . Tardive dyskinesia [G24.01] 01/24/2017  . Tobacco use disorder [F17.200] 11/29/2016   Total Time spent with patient: 20 minutes  Past Psychiatric History: See H&P  Past Medical History:  Past Medical History:  Diagnosis Date  . Schizophrenia (Dustin [name])    History reviewed. No pertinent surgical history. Family History: History reviewed. No pertinent family history. Family Psychiatric  History: See H&P Social History:  Social History   Substance and Sexual Activity  Alcohol Use Yes  . Alcohol/week: 4.2 oz  . Types: 7 Shots of liquor per week   Comment: 1 drink every night- gin     Social History   Substance and Sexual Activity  Drug Use No    Social History   Socioeconomic History  . Marital status: Single    Spouse name: Not on file  . Number of children: Not on file  . Years of education: Not on file  . Highest education level: Not on file  Occupational History  . Not on file  Social Needs  . Financial resource strain: Not on file  . Food insecurity:    Worry: Not on file    Inability: Not on file  . Transportation needs:    Medical: Not on file    Non-medical: Not on file  Tobacco Use  . Smoking status: Current Every Day Smoker    Packs/day: 0.50    Years: 15.00    Pack years: 7.50  . Smokeless tobacco: Never Used  Substance and Sexual Activity  . Alcohol use: Yes    Alcohol/week:  4.2 oz    Types: 7 Shots of liquor per week    Comment: 1 drink every night- gin  . Drug use: No  . Sexual activity: Never    Birth control/protection: None  Lifestyle  . Physical activity:    Days per week: Not on file    Minutes per session: Not on file  . Stress: Not on file  Relationships  . Social connections:    Talks on phone: Not on file    Gets together: Not on file    Attends religious service: Not on file    Active member of club or organization: Not on file    Attends meetings of clubs or organizations: Not on file    Relationship status: Not on file  Other Topics Concern  . Not on file  Social History Narrative  . Not on file   Additional Social History:    Pain Medications: none Prescriptions: noncompliant Over the Counter: none History of alcohol / drug use?: No history of alcohol / drug abuse Longest period of sobriety (when/how long): unknown Negative Consequences of Use: (none) Withdrawal Symptoms: (none)                    Sleep: Good  Appetite:  Good  Current Medications: Current Facility-Administered Medications  Medication Dose Route Frequency Provider Last Rate Last Dose  . acetaminophen (TYLENOL) tablet 650 mg  650 mg Oral Q6H PRN [name], [name], MD      . alum & mag hydroxide-simeth (MAALOX/MYLANTA) 200-200-20 MG/5ML suspension 30 mL  30 mL Oral Q4H PRN [name], [name], MD      . divalproex (DEPAKOTE) DR tablet 500 mg  500 mg Oral Q12H [name], [name], MD   500 mg at 08/25/17 9767  . hydrOXYzine (ATARAX/VISTARIL) tablet 50 mg  50 mg Oral TID PRN [name], [name], MD      . magnesium hydroxide (MILK OF MAGNESIA) suspension 30 mL  30 mL Oral Daily PRN [name], [name], MD      . nicotine (NICODERM CQ - dosed in mg/24 hours) patch 21 mg  21 mg Transdermal Daily [name], [name], MD   21 mg at 08/22/17 0901  . OLANZapine (ZYPREXA) tablet 5 mg  5 mg Oral Q6H PRN [name], [name], MD      . Derrill Memo ON 08/28/2017] paliperidone (INVEGA SUSTENNA)  injection 156 mg  156 mg Intramuscular Once [name], [name], MD      . paliperidone (INVEGA) 24 hr tablet 9 mg  9 mg Oral Daily [name], MD   9 mg at 08/25/17 0858  . traZODone (DESYREL) tablet 50 mg  50 mg Oral QHS PRN [name], [name], MD        Lab Results: No results found for this or any previous visit (from the past 48 hour(s)).  Blood Alcohol level:  Lab Results  Component Value Date   ETH <10 08/15/2017   ETH <5 34/19/3790    Metabolic Disorder Labs: Lab Results  Component Value Date   HGBA1C 5.4 08/17/2017   MPG 108.28 08/17/2017   MPG 105.41 01/12/2017   No results found for: PROLACTIN Lab Results  Component Value Date   CHOL 131 08/17/2017   TRIG 111 08/17/2017   HDL 46 08/17/2017   CHOLHDL 2.8 08/17/2017   VLDL 22 08/17/2017   LDLCALC 63 08/17/2017   LDLCALC 88 01/12/2017    Physical Findings: AIMS: Facial and Oral Movements Muscles of Facial Expression: None, normal Lips and Perioral Area: None, normal Jaw: None, normal Tongue: None, normal,Extremity Movements Upper (arms, wrists, hands, fingers): None, normal Lower (legs, knees, ankles, toes): None, normal, Trunk Movements Neck, shoulders, hips: None, normal, Overall Severity Severity of abnormal movements (highest score from questions above): None, normal Incapacitation due to abnormal movements: None, normal Patient's awareness of abnormal movements (rate only patient's report): No Awareness, Dental Status Current problems with teeth and/or dentures?: No Does patient usually wear dentures?: No  CIWA:    COWS:     Musculoskeletal: Strength & Muscle Tone: within normal limits Gait & Station: normal Patient leans: N/A  Psychiatric Specialty Exam: Physical Exam  Nursing note and vitals reviewed.   ROS   Blood pressure 91/61, pulse 69, temperature 98.6 F (37 C), temperature source Oral, resp. rate 18, height 5\' 9"  (1.753 m), weight 77.6 kg (171 lb), SpO2 100 %.Body mass index is 25.25  kg/m.  General Appearance: Casual  Eye Contact:  Good  Speech:  Clear and Coherent  Volume:  Normal  Mood:  Euthymic  Affect:  Appropriate  Thought Process:  Coherent  Orientation:  Full (Time, Place, and Person)  Thought Content:  Logical  Suicidal Thoughts:  No  Homicidal Thoughts:  No  Memory:  Immediate;   Fair  Judgement:  Fair  Insight:  Fair  Psychomotor Activity:  Normal  Concentration:  Concentration: Fair  Recall:  [employer] of Knowledge:  Fair  Language:  Fair  Akathisia:  No      Assets:  Communication Skills Resilience  ADL's:  Intact  Cognition:  WNL  Sleep:  Number of Hours: 6.15     Treatment Plan Summary: 62 yo male admitted due to agitation and decompensation at home. He was started on Invega oral and injection. He is overall more organized and no delusions.  HE has not had any agitation or outburst on the unit.   Schizophrenia -He received initial [place] injection on 3/20. Next injection will be given on 3/26 and will stop oral Invega -Continue Depakote 500 mg BID  Dispo -He follows with [name]. He is his own guardian. He would like to return to live with his brother on discharge    Jolene Schimke, MD 08/25/2017, 2:12 PM

## 2017-08-25 NOTE — [facility] Group Notes (Signed)
[facility] Group Notes:  (Nursing/MHT/Case Management/Adjunct)  Date:  08/25/2017  Time:  2:43 PM  Type of Therapy:  Psychoeducational Skills  Participation Level:  Did Not Attend  Summary of Progress/Problems:  Kathi Ludwig 08/25/2017, 2:43 PM

## 2017-08-25 NOTE — Plan of Care (Signed)
No psychotic symptoms observed this evening. Pt. Verbalizes  understanding of provided education. Pt. Compliant with medications. Pt. Eating good per reports. Pt. Sleeping good per reports. Pt. Verbalizes he can remain safe while on the unit. Pt. Denies SI/HI. Pt. Contracts for safety.   Problem: Education: Goal: Will be free of psychotic symptoms Outcome: Progressing Goal: Knowledge of the prescribed therapeutic regimen will improve Outcome: Progressing   Problem: Health Behavior/Discharge Planning: Goal: Compliance with prescribed medication regimen will improve Outcome: Progressing   Problem: Nutritional: Goal: Ability to achieve adequate nutritional intake will improve Outcome: Progressing   Problem: Safety: Goal: Ability to remain free from injury will improve Outcome: Progressing   Problem: Activity: Goal: Sleeping patterns will improve Outcome: Progressing

## 2017-08-25 NOTE — Progress Notes (Signed)
D:Pt denies SI/HI/AVH. Pt. Contracts for safety. Pt is pleasant and cooperative. Pt.has no Complaints.Patient Interaction is engaging. No psychotic symptoms observed this evening. Pt. Verbalizes understanding of provided education. Pt Eating good per reports and sleeping good. Pt. At times will ramble.   A: Q x 15 minute observation checks were completed for safety. Patient was provided with education. Patient was given scheduled medications. Patient was encourage to attend groups, participate in unit activities and continue with plan of care.   R:Patient is complaint with medication and unit procedures. Did not go to groups. Pt. Frequently observed in the milieu around peers and watching tv this evening. Pt. Attends snack.             Precautionary checks every 15 minutes for safety maintained, room free of safety hazards, patient sustains no injury or falls during this shift.

## 2017-08-26 MED ORDER — SENNOSIDES-DOCUSATE SODIUM 8.6-50 MG PO TABS
1.0000 | ORAL_TABLET | Freq: Every day | ORAL | Status: AC
Start: 2017-08-26 — End: 2017-08-26
  Administered 2017-08-26: 1 via ORAL
  Filled 2017-08-26: qty 1

## 2017-08-26 NOTE — [facility] Group Notes (Signed)
LCSW Group Therapy Note 08/26/2017 1:15pm  Type of Therapy and Topic: Group Therapy: Feelings Around Returning Home & Establishing a Supportive Framework and Supporting Oneself When Supports Not Available  Participation Level: None  Description of Group:  Patients first processed thoughts and feelings about upcoming discharge. These included fears of upcoming changes, lack of change, new living environments, judgements and expectations from others and overall stigma of mental health issues. The group then discussed the definition of a supportive framework, what that looks and feels like, and how do to discern it from an unhealthy non-supportive network. The group identified different types of supports as well as what to do when your family/friends are less than helpful or unavailable  Therapeutic Goals  1. Patient will identify one healthy supportive network that they can use at discharge. 2. Patient will identify one factor of a supportive framework and how to tell it from an unhealthy network. 3. Patient able to identify one coping skill to use when they do not have positive supports from others. 4. Patient will demonstrate ability to communicate their needs through discussion and/or role plays.  Summary of Patient Progress:  Pt attended group but did not participate.   Therapeutic Modalities Cognitive Behavioral Therapy Motivational Interviewing   Cheree Ditto, LCSW 08/26/2017 12:27 PM

## 2017-08-26 NOTE — Progress Notes (Signed)
D- Patient alert and oriented. Patient presents in a pleasant mood on assessment stating that he slept ok last night and has no complaints at this time reporting that he "feels good". Patient denies SI, HI, AVH, and pain at this time. Patient continues to talk to this Probation officer and expresses to this Probation officer "don't listen to that woman in here, if I close my ears can you hear her". Patient also points to his top lip and states "this is the Psalm". Patient is expressing hyper-religiosity stating "my aunt calls Jesus". Patient's goal for today is to "read the [place] bible, because I'm [place]".  A- Scheduled medications administered to patient, per MD orders. Support and encouragement provided.  Routine safety checks conducted every 15 minutes. Patient informed to notify staff with problems or concerns.   R- No adverse drug reactions noted. Patient contracts for safety at this time. Patient compliant with medications and treatment plan. Patient receptive, calm, and cooperative. Patient interacts well with others on the unit.  Patient remains safe at this time.

## 2017-08-26 NOTE — [facility] Group Notes (Signed)
[facility] Group Notes:  (Nursing/MHT/Case Management/Adjunct)  Date:  08/26/2017  Time:  9:08 PM  Type of Therapy:  Psychoeducational Skills  Participation Level:  Did Not Attend   Joretta Bachelor 08/26/2017, 9:08 PM

## 2017-08-26 NOTE — Progress Notes (Signed)
D:Pt denies SI/HI/AVH. Contracts for safety.Pt is pleasant and cooperative. Pt.has noComplaints.Patient Interactionis engaging, while eating snacks this evening.No psychotic symptoms observed this evening, but came off misunderstood at first when pt. Was talking about tv shows he was watching to this Probation officer throughout the day. Received in report pt. Potentially responding to internal stimuli and religiously preoccupied. Will continue to monitor for psychotic features and delusions. Pt. Verbalizes understanding of provided education.PtEating good per reports and sleeping good. Pt. At times will ramble and is tangential.   A: Q x 15 minute observation checks were completed for safety. Patient was provided with education. Patient was given scheduled medications. Patient was encourage to attend groups, participate in unit activities and continue with plan of care.   R:Patient is complaint with medication and unit procedures. Did not go to groups. Pt. Frequently observed in the milieu around peers and watching tv this evening.Pt. Attends snack.             Precautionary checks every 15 minutes for safety maintained, room free of safety hazards, patient sustains no injury or falls during this shift.

## 2017-08-26 NOTE — Plan of Care (Signed)
No psychotic symptoms observed this evening, pt. Engages with this Probation officer well while eating a snack. Pt. Verbalizes  understanding of provided education. Pt. Compliant with medications. Pt. Eating good per reports. Pt. Sleeping good per reports. Pt. Verbalizes he can remain safe while on the unit. Pt. Denies SI/HI. Pt. Contracts for safety.    Problem: Education: Goal: Will be free of psychotic symptoms Outcome: Progressing Goal: Knowledge of the prescribed therapeutic regimen will improve Outcome: Progressing   Problem: Coping: Goal: Ability to verbalize feelings will improve Outcome: Progressing   Problem: Health Behavior/Discharge Planning: Goal: Compliance with prescribed medication regimen will improve Outcome: Progressing   Problem: Nutritional: Goal: Ability to achieve adequate nutritional intake will improve Outcome: Progressing   Problem: Safety: Goal: Ability to remain free from injury will improve Outcome: Progressing   Problem: Activity: Goal: Sleeping patterns will improve Outcome: Progressing

## 2017-08-26 NOTE — Progress Notes (Signed)
[facility] MD Progress Note  08/26/2017 5:55 PM Dustin [name]  MRN:  [id number]   Subjective:  Pt is laying in bed but he is wide awake.  He is rambling again today.This time it doesn't make as much sene as he did yesterday.  He talks about having 6 senses.  He was also talking to nursing about a woman in his head.  He is psychotic.  Denies si/hi/avh but clearly having delusional thinking.  Principal Problem: Schizoaffective disorder, bipolar type ([place]) Diagnosis:   Patient Active Problem List   Diagnosis Date Noted  . Schizoaffective disorder, bipolar type ([place]) [F25.0] 01/24/2017  . Tardive dyskinesia [G24.01] 01/24/2017  . Tobacco use disorder [F17.200] 11/29/2016   Total Time spent with patient: 20 minutes  Past Psychiatric History: See H&P  Past Medical History:  Past Medical History:  Diagnosis Date  . Schizophrenia ([place])    History reviewed. No pertinent surgical history. Family History: History reviewed. No pertinent family history. Family Psychiatric  History: See H&P Social History:  Social History   Substance and Sexual Activity  Alcohol Use Yes  . Alcohol/week: 4.2 oz  . Types: 7 Shots of liquor per week   Comment: 1 drink every night- gin     Social History   Substance and Sexual Activity  Drug Use No    Social History   Socioeconomic History  . Marital status: Single    Spouse name: Not on file  . Number of children: Not on file  . Years of education: Not on file  . Highest education level: Not on file  Occupational History  . Not on file  Social Needs  . Financial resource strain: Not on file  . Food insecurity:    Worry: Not on file    Inability: Not on file  . Transportation needs:    Medical: Not on file    Non-medical: Not on file  Tobacco Use  . Smoking status: Current Every Day Smoker    Packs/day: 0.50    Years: 15.00    Pack years: 7.50  . Smokeless tobacco: Never Used  Substance and Sexual Activity  . Alcohol use: Yes    Alcohol/week:  4.2 oz    Types: 7 Shots of liquor per week    Comment: 1 drink every night- gin  . Drug use: No  . Sexual activity: Never    Birth control/protection: None  Lifestyle  . Physical activity:    Days per week: Not on file    Minutes per session: Not on file  . Stress: Not on file  Relationships  . Social connections:    Talks on phone: Not on file    Gets together: Not on file    Attends religious service: Not on file    Active member of club or organization: Not on file    Attends meetings of clubs or organizations: Not on file    Relationship status: Not on file  Other Topics Concern  . Not on file  Social History Narrative  . Not on file   Additional Social History:    Pain Medications: none Prescriptions: noncompliant Over the Counter: none History of alcohol / drug use?: No history of alcohol / drug abuse Longest period of sobriety (when/how long): unknown Negative Consequences of Use: (none) Withdrawal Symptoms: (none)                    Sleep: Good  Appetite:  Good  Current Medications: Current Facility-Administered Medications  Medication Dose Route Frequency Provider Last Rate Last Dose  . acetaminophen (TYLENOL) tablet 650 mg  650 mg Oral Q6H PRN [name], [name], MD      . alum & mag hydroxide-simeth (MAALOX/MYLANTA) 200-200-20 MG/5ML suspension 30 mL  30 mL Oral Q4H PRN [name], [name], MD      . divalproex (DEPAKOTE) DR tablet 500 mg  500 mg Oral Q12H [name], [name], MD   500 mg at 08/26/17 0848  . hydrOXYzine (ATARAX/VISTARIL) tablet 50 mg  50 mg Oral TID PRN [name], [name], MD      . magnesium hydroxide (MILK OF MAGNESIA) suspension 30 mL  30 mL Oral Daily PRN [name], [name], MD      . nicotine (NICODERM CQ - dosed in mg/24 hours) patch 21 mg  21 mg Transdermal Daily [name], [name], MD   21 mg at 08/22/17 0901  . OLANZapine (ZYPREXA) tablet 5 mg  5 mg Oral Q6H PRN [name], [name], MD      . Derrill Memo ON 08/28/2017] paliperidone (INVEGA SUSTENNA)  injection 156 mg  156 mg Intramuscular Once [name], [name], MD      . paliperidone (INVEGA) 24 hr tablet 9 mg  9 mg Oral Daily [name], MD   9 mg at 08/26/17 0848  . traZODone (DESYREL) tablet 50 mg  50 mg Oral QHS PRN [name], [name], MD        Lab Results: No results found for this or any previous visit (from the past 48 hour(s)).  Blood Alcohol level:  Lab Results  Component Value Date   ETH <10 08/15/2017   ETH <5 38/18/2993    Metabolic Disorder Labs: Lab Results  Component Value Date   HGBA1C 5.4 08/17/2017   MPG 108.28 08/17/2017   MPG 105.41 01/12/2017   No results found for: PROLACTIN Lab Results  Component Value Date   CHOL 131 08/17/2017   TRIG 111 08/17/2017   HDL 46 08/17/2017   CHOLHDL 2.8 08/17/2017   VLDL 22 08/17/2017   LDLCALC 63 08/17/2017   LDLCALC 88 01/12/2017    Physical Findings: AIMS: Facial and Oral Movements Muscles of Facial Expression: None, normal Lips and Perioral Area: None, normal Jaw: None, normal Tongue: None, normal,Extremity Movements Upper (arms, wrists, hands, fingers): None, normal Lower (legs, knees, ankles, toes): None, normal, Trunk Movements Neck, shoulders, hips: None, normal, Overall Severity Severity of abnormal movements (highest score from questions above): None, normal Incapacitation due to abnormal movements: None, normal Patient's awareness of abnormal movements (rate only patient's report): No Awareness, Dental Status Current problems with teeth and/or dentures?: No Does patient usually wear dentures?: No  CIWA:    COWS:     Musculoskeletal: Strength & Muscle Tone: within normal limits Gait & Station: normal Patient leans: N/A  Psychiatric Specialty Exam: Physical Exam  Nursing note and vitals reviewed.   ROS   Blood pressure 98/80, pulse 94, temperature 97.6 F (36.4 C), temperature source Oral, resp. rate 18, height 5\' 9"  (1.753 m), weight 77.6 kg (171 lb), SpO2 100 %.Body mass index is 25.25  kg/m.  General Appearance: Casual  Eye Contact:  Good  Speech:  Clear and Coherent  Volume:  Normal  Mood:  Euthymic  Affect:  Appropriate  Thought Process:  Coherent  Orientation:  Full (Time, Place, and Person)  Thought Content:  Illogical and Delusions  Suicidal Thoughts:  No  Homicidal Thoughts:  No  Memory:  Immediate;   Fair  Judgement:  Fair  Insight:  Fair  Psychomotor Activity:  Normal  Concentration:  Concentration: Fair  Recall:  [employer] of Knowledge:  Fair  Language:  Fair  Akathisia:  No      Assets:  Communication Skills Resilience  ADL's:  Intact  Cognition:  WNL  Sleep:  Number of Hours: 5.3     Treatment Plan Summary: 62 yo male admitted due to agitation and decompensation at home. He was started on Invega oral and injection. He is overall more organized and no delusions.  HE has not had any agitation or outburst on the unit.   Pt a little more delusional today but will observe.  Was doing better the day before and no changes have been made.  Schizophrenia -He received initial [place] injection on 3/20. Next injection will be given on 3/26 and will stop oral Invega -Continue Depakote 500 mg BID C/o of hard stools will  Give dose of senokot s tonight  Dispo -He follows with [facility] ACT team. He is his own guardian. He would like to return to live with his brother on discharge    Jolene Schimke, MD 08/26/2017, 5:55 PM

## 2017-08-27 NOTE — Progress Notes (Signed)
[facility] MD Progress Note  08/27/2017 4:18 PM Dustin [name]  MRN:  [id number]   Subjective:  Pt is still delusional at times and states that he has something in his tooth helping him to hear. HE does make some bizzare comments through interview but overall able to answer most questions rationally. HE has been very calm on the unit. HE has been compliant with medications and states that he plans to continue to be compliant at home. He states that he does not like the ACT team trying to control him but he will take the medications. He has good hygiene and is glad that his brother brought him some clothes. He denies SI, HI, AH. He is not hyperreligious today with this provider.   Principal Problem: Schizoaffective disorder, bipolar type ([place]) Diagnosis:   Patient Active Problem List   Diagnosis Date Noted  . Schizoaffective disorder, bipolar type ([place]) [F25.0] 01/24/2017    Priority: High  . Tardive dyskinesia [G24.01] 01/24/2017  . Tobacco use disorder [F17.200] 11/29/2016   Total Time spent with patient: 15 minutes  Past Psychiatric History: See H&P  Past Medical History:  Past Medical History:  Diagnosis Date  . Schizophrenia ([place])    History reviewed. No pertinent surgical history. Family History: History reviewed. No pertinent family history. Family Psychiatric  History: See H&P Social History:  Social History   Substance and Sexual Activity  Alcohol Use Yes  . Alcohol/week: 4.2 oz  . Types: 7 Shots of liquor per week   Comment: 1 drink every night- gin     Social History   Substance and Sexual Activity  Drug Use No    Social History   Socioeconomic History  . Marital status: Single    Spouse name: Not on file  . Number of children: Not on file  . Years of education: Not on file  . Highest education level: Not on file  Occupational History  . Not on file  Social Needs  . Financial resource strain: Not on file  . Food insecurity:    Worry: Not on file    Inability:  Not on file  . Transportation needs:    Medical: Not on file    Non-medical: Not on file  Tobacco Use  . Smoking status: Current Every Day Smoker    Packs/day: 0.50    Years: 15.00    Pack years: 7.50  . Smokeless tobacco: Never Used  Substance and Sexual Activity  . Alcohol use: Yes    Alcohol/week: 4.2 oz    Types: 7 Shots of liquor per week    Comment: 1 drink every night- gin  . Drug use: No  . Sexual activity: Never    Birth control/protection: None  Lifestyle  . Physical activity:    Days per week: Not on file    Minutes per session: Not on file  . Stress: Not on file  Relationships  . Social connections:    Talks on phone: Not on file    Gets together: Not on file    Attends religious service: Not on file    Active member of club or organization: Not on file    Attends meetings of clubs or organizations: Not on file    Relationship status: Not on file  Other Topics Concern  . Not on file  Social History Narrative  . Not on file   Additional Social History:    Pain Medications: none Prescriptions: noncompliant Over the Counter: none History of alcohol /  drug use?: No history of alcohol / drug abuse Longest period of sobriety (when/how long): unknown Negative Consequences of Use: (none) Withdrawal Symptoms: (none)                    Sleep: Good  Appetite:  Good  Current Medications: Current Facility-Administered Medications  Medication Dose Route Frequency Provider Last Rate Last Dose  . acetaminophen (TYLENOL) tablet 650 mg  650 mg Oral Q6H PRN [name], [name], MD      . alum & mag hydroxide-simeth (MAALOX/MYLANTA) 200-200-20 MG/5ML suspension 30 mL  30 mL Oral Q4H PRN [name], [name], MD      . divalproex (DEPAKOTE) DR tablet 500 mg  500 mg Oral Q12H [name], [name], MD   500 mg at 08/27/17 1000  . hydrOXYzine (ATARAX/VISTARIL) tablet 50 mg  50 mg Oral TID PRN [name], [name], MD      . magnesium hydroxide (MILK OF MAGNESIA) suspension 30 mL   30 mL Oral Daily PRN [name], [name], MD      . nicotine (NICODERM CQ - dosed in mg/24 hours) patch 21 mg  21 mg Transdermal Daily [name], [name], MD   21 mg at 08/22/17 0901  . OLANZapine (ZYPREXA) tablet 5 mg  5 mg Oral Q6H PRN [name], [name], MD      . Derrill Memo ON 08/28/2017] paliperidone (INVEGA SUSTENNA) injection 156 mg  156 mg Intramuscular Once [name], [name], MD      . paliperidone (INVEGA) 24 hr tablet 9 mg  9 mg Oral Daily [name], MD   9 mg at 08/27/17 1000  . traZODone (DESYREL) tablet 50 mg  50 mg Oral QHS PRN [name], [name], MD        Lab Results: No results found for this or any previous visit (from the past 48 hour(s)).  Blood Alcohol level:  Lab Results  Component Value Date   ETH <10 08/15/2017   ETH <5 44/96/7591    Metabolic Disorder Labs: Lab Results  Component Value Date   HGBA1C 5.4 08/17/2017   MPG 108.28 08/17/2017   MPG 105.41 01/12/2017   No results found for: PROLACTIN Lab Results  Component Value Date   CHOL 131 08/17/2017   TRIG 111 08/17/2017   HDL 46 08/17/2017   CHOLHDL 2.8 08/17/2017   VLDL 22 08/17/2017   LDLCALC 63 08/17/2017   LDLCALC 88 01/12/2017    Physical Findings: AIMS: Facial and Oral Movements Muscles of Facial Expression: None, normal Lips and Perioral Area: None, normal Jaw: None, normal Tongue: None, normal,Extremity Movements Upper (arms, wrists, hands, fingers): None, normal Lower (legs, knees, ankles, toes): None, normal, Trunk Movements Neck, shoulders, hips: None, normal, Overall Severity Severity of abnormal movements (highest score from questions above): None, normal Incapacitation due to abnormal movements: None, normal Patient's awareness of abnormal movements (rate only patient's report): No Awareness, Dental Status Current problems with teeth and/or dentures?: No Does patient usually wear dentures?: No  CIWA:    COWS:     Musculoskeletal: Strength & Muscle Tone: within normal limits Gait &  Station: normal Patient leans: N/A  Psychiatric Specialty Exam: Physical Exam  Nursing note and vitals reviewed.   Review of Systems  All other systems reviewed and are negative.   Blood pressure 117/72, pulse 97, temperature 97.6 F (36.4 C), temperature source Oral, resp. rate 16, height 5\' 9"  (1.753 m), weight 77.6 kg (171 lb), SpO2 100 %.Body mass index is 25.25 kg/m.  General Appearance: Casual  Eye Contact:  Good  Speech:  Garbled  Volume:  Normal  Mood:  Euthymic  Affect:  Appropriate  Thought Process:  Coherent  Orientation:  Full (Time, Place, and Person)  Thought Content:  Delusions  Suicidal Thoughts:  No  Homicidal Thoughts:  No  Memory:  Immediate;   Fair  Judgement:  Fair  Insight:  Lacking  Psychomotor Activity:  Normal  Concentration:  Concentration: Fair  Recall:  [employer] of Knowledge:  Fair  Language:  Fair  Akathisia:  No      Assets:  Resilience  ADL's:  Intact  Cognition:  WNL  Sleep:  Number of Hours: 4.45     Treatment Plan Summary: 62 yo male admitted due to decompensation. He is still delusional at times but overall much improved compared to admission. He has been calm on the unit and caring for his ADLs. Delusional content is not dangerous content. HE is also not perseverative on this. He is close to his baseline. HE is due to for his second Invega injection tomorrow.   Plan:  Schizophrenia -He received initial Invega [place] injection on 3/20. Next injection will be given on 3/26 and will stop oral Invega -Continue Depakote 500 mg BID  Dispo -He follows with [name] [facility] team. He is his own guardian. He would like to return to live with his brother on discharge. Possible discharge on Wednesday    Marylin Crosby, MD 08/27/2017, 4:18 PM

## 2017-08-27 NOTE — Tx Team (Signed)
Interdisciplinary Treatment and Diagnostic Plan Update  08/27/2017 Time of Session: 1030AM CARR SHARTZER MRN: [id number]  Principal Diagnosis: Schizoaffective disorder, bipolar type [facility])  Secondary Diagnoses: Principal Problem:   Schizoaffective disorder, bipolar type ([place]) Active Problems:   Tobacco use disorder   Tardive dyskinesia   Current Medications:  Current Facility-Administered Medications  Medication Dose Route Frequency Provider Last Rate Last Dose  . acetaminophen (TYLENOL) tablet 650 mg  650 mg Oral Q6H PRN [name], [name], MD      . alum & mag hydroxide-simeth (MAALOX/MYLANTA) 200-200-20 MG/5ML suspension 30 mL  30 mL Oral Q4H PRN [name], [name], MD      . divalproex (DEPAKOTE) DR tablet 500 mg  500 mg Oral Q12H [name], [name], MD   500 mg at 08/27/17 1000  . hydrOXYzine (ATARAX/VISTARIL) tablet 50 mg  50 mg Oral TID PRN [name], [name], MD      . magnesium hydroxide (MILK OF MAGNESIA) suspension 30 mL  30 mL Oral Daily PRN [name], [name], MD      . nicotine (NICODERM CQ - dosed in mg/24 hours) patch 21 mg  21 mg Transdermal Daily [name], [name], MD   21 mg at 08/22/17 0901  . OLANZapine (ZYPREXA) tablet 5 mg  5 mg Oral Q6H PRN [name], [name], MD      . Derrill Memo ON 08/28/2017] paliperidone (INVEGA SUSTENNA) injection 156 mg  156 mg Intramuscular Once [name], [name], MD      . paliperidone (INVEGA) 24 hr tablet 9 mg  9 mg Oral Daily [name], MD   9 mg at 08/27/17 1000  . traZODone (DESYREL) tablet 50 mg  50 mg Oral QHS PRN [name], [name], MD       PTA Medications: Medications Prior to Admission  Medication Sig Dispense Refill Last Dose  . fluPHENAZine decanoate (PROLIXIN) 25 MG/ML injection Inject 2 mLs (50 mg total) into the muscle every 21 ( twenty-one) days. 2 mL 0 Past Month at Unknown time  . ARIPiprazole (ABILIFY) 15 MG tablet Take 1 tablet (15 mg total) by mouth daily. (Patient not taking: Reported on 08/16/2017) 30 tablet 0 Not Taking at Unknown time  .  divalproex (DEPAKOTE) 500 MG DR tablet Take 1 tablet (500 mg total) by mouth every 12 (twelve) hours. (Patient not taking: Reported on 08/16/2017) 60 tablet 0 Not Taking at Unknown time  . zolpidem (AMBIEN) 10 MG tablet Take 1 tablet (10 mg total) by mouth at bedtime as needed for sleep. (Patient not taking: Reported on 08/16/2017) 30 tablet 0 Not Taking at Unknown time    Patient Stressors: Medication change or noncompliance  Patient Strengths: Average or above average intelligence Communication skills Physical Health  Treatment Modalities: Medication Management, Group therapy, Case management,  1 to 1 session with clinician, Psychoeducation, Recreational therapy.   Physician Treatment Plan for Primary Diagnosis: Schizoaffective disorder, bipolar type ([place]) Long Term Goal(s): Improvement in symptoms so as ready for discharge   Short Term Goals: Compliance with prescribed medications will improve  Medication Management: Evaluate patient's response, side effects, and tolerance of medication regimen.  Therapeutic Interventions: 1 to 1 sessions, Unit Group sessions and Medication administration.  Evaluation of Outcomes: Progressing  Physician Treatment Plan for Secondary Diagnosis: Principal Problem:   Schizoaffective disorder, bipolar type ([place]) Active Problems:   Tobacco use disorder   Tardive dyskinesia  Long Term Goal(s): Improvement in symptoms so as ready for discharge   Short Term Goals: Compliance with prescribed medications  will improve     Medication Management: Evaluate patient's response, side effects, and tolerance of medication regimen.  Therapeutic Interventions: 1 to 1 sessions, Unit Group sessions and Medication administration.  Evaluation of Outcomes: Progressing   RN Treatment Plan for Primary Diagnosis: Schizoaffective disorder, bipolar type ([place]) Long Term Goal(s): Knowledge of disease and therapeutic regimen to maintain health will improve  Short Term  Goals: Ability to demonstrate self-control, Ability to participate in decision making will improve, Ability to identify and develop effective coping behaviors will improve and Compliance with prescribed medications will improve  Medication Management: RN will administer medications as ordered by provider, will assess and evaluate patient's response and provide education to patient for prescribed medication. RN will report any adverse and/or side effects to prescribing provider.  Therapeutic Interventions: 1 on 1 counseling sessions, Psychoeducation, Medication administration, Evaluate responses to treatment, Monitor vital signs and CBGs as ordered, Perform/monitor CIWA, COWS, AIMS and Fall Risk screenings as ordered, Perform wound care treatments as ordered.  Evaluation of Outcomes: Progressing   LCSW Treatment Plan for Primary Diagnosis: Schizoaffective disorder, bipolar type ([place]) Long Term Goal(s): Safe transition to appropriate next level of care at discharge, Engage patient in therapeutic group addressing interpersonal concerns.  Short Term Goals: Engage patient in aftercare planning with referrals and resources, Facilitate acceptance of mental health diagnosis and concerns, Facilitate patient progression through stages of change regarding substance use diagnoses and concerns, Identify triggers associated with mental health/substance abuse issues and Increase skills for wellness and recovery  Therapeutic Interventions: Assess for all discharge needs, 1 to 1 time with Social worker, Explore available resources and support systems, Assess for adequacy in community support network, Educate family and significant other(s) on suicide prevention, Complete Psychosocial Assessment, Interpersonal group therapy.  Evaluation of Outcomes: Progressing   Progress in Treatment: Attending groups: Yes. Participating in groups: Yes. Taking medication as prescribed: Yes. Toleration medication:  Yes. Family/Significant other contact made: Yes, individual(s) contacted:  Patients Brother [name] Patient understands diagnosis: Yes. Discussing patient identified problems/goals with staff: Yes. Medical problems stabilized or resolved: Yes. Denies suicidal/homicidal ideation: Yes. Issues/concerns per patient self-inventory: No. Other: None at this time.   New problem(s) identified: No, Describe:  None  New Short Term/Long Term Goal(s): "To get out of here."  Discharge Plan or Barriers: . At discharge, patient will return back home with his brother and continue to follow up with his ACTT Team.   Reason for Continuation of Hospitalization: Medication stabilization  Estimated Length of Stay: 2-3 days  Attendees: Patient:  08/27/2017 10:50 AM  Physician: Amador Cunas, MD 08/27/2017 10:50 AM  Nursing:  08/27/2017 10:50 AM  RN Care Manager: 08/27/2017 10:50 AM  Social Worker: Alden Hipp, LCSW 08/27/2017 10:50 AM  Recreational Therapist:  08/27/2017 10:50 AM  Other: Dossie Arbour, LCSW 08/27/2017 10:50 AM  Other:  08/27/2017 10:50 AM  Other: 08/27/2017 10:50 AM    Scribe for Treatment Team: Alden Hipp, LCSW 08/27/2017 10:50 AM

## 2017-08-27 NOTE — Progress Notes (Signed)
Received [facility] this AM after his breakfast, he was compliant with his medications. He denied all of the psychiatric symptoms. He is OOB in the milieu at intervals and socializing with select peers.

## 2017-08-28 MED ORDER — DIVALPROEX SODIUM 500 MG PO DR TAB: 500.0000 mg | DELAYED_RELEASE_TABLET | Freq: Two times a day (BID) | ORAL | 0 refills | Status: DC

## 2017-08-28 MED ORDER — PALIPERIDONE PALMITATE 234 MG/1.5ML IM SUSP: 234.0000 mg | Freq: Once | INTRAMUSCULAR | 0 refills | Status: DC

## 2017-08-28 NOTE — Progress Notes (Signed)
D: Pt denies SI/HI/AVH, affect is blunted, mood is irritable at times but follows redirection. Patient's thoughts are organized, he is interacting with peers and staff appropriately.  A: Pt was offered support and encouraged to attend evening group. Pt was given scheduled medication and encouraged to attend groups. Q 15 minute checks were done for safety.  R: Pt attends groups and interacts well with peers and staff. Pt is complaint with  Medication, and safety maintained on unit.will continue to monitor.

## 2017-08-28 NOTE — Progress Notes (Signed)
[facility] MD Progress Note  08/28/2017 2:18 PM Dustin [name]  MRN:  [id number] Subjective:  Pt reports feeling well. He asks when he will get his next Invega injection today. He is still delusional at times but not perseverative on this. He states that he has something in his tooth to help him hear. He is hyperrelgious and states that the bible "is my medicine. I'll take your medicine too but the bible is my medicine." However, he is able to redirect from this and is not perseverative on religion. He has been eating well and caring for his ADLs. Hygiene is fair and he has been showering and changing clothes. He has no complaints at all today.   Principal Problem: Schizoaffective disorder, bipolar type ([place]) Diagnosis:   Patient Active Problem List   Diagnosis Date Noted  . Schizoaffective disorder, bipolar type ([place]) [F25.0] 01/24/2017    Priority: High  . Tardive dyskinesia [G24.01] 01/24/2017  . Tobacco use disorder [F17.200] 11/29/2016   Total Time spent with patient: 20 minutes  Past Psychiatric History: See H&P  Past Medical History:  Past Medical History:  Diagnosis Date  . Schizophrenia ([place])    History reviewed. No pertinent surgical history. Family History: History reviewed. No pertinent family history. Family Psychiatric  History: See H&P Social History:  Social History   Substance and Sexual Activity  Alcohol Use Yes  . Alcohol/week: 4.2 oz  . Types: 7 Shots of liquor per week   Comment: 1 drink every night- gin     Social History   Substance and Sexual Activity  Drug Use No    Social History   Socioeconomic History  . Marital status: Single    Spouse name: Not on file  . Number of children: Not on file  . Years of education: Not on file  . Highest education level: Not on file  Occupational History  . Not on file  Social Needs  . Financial resource strain: Not on file  . Food insecurity:    Worry: Not on file    Inability: Not on file  . Transportation needs:     Medical: Not on file    Non-medical: Not on file  Tobacco Use  . Smoking status: Current Every Day Smoker    Packs/day: 0.50    Years: 15.00    Pack years: 7.50  . Smokeless tobacco: Never Used  Substance and Sexual Activity  . Alcohol use: Yes    Alcohol/week: 4.2 oz    Types: 7 Shots of liquor per week    Comment: 1 drink every night- gin  . Drug use: No  . Sexual activity: Never    Birth control/protection: None  Lifestyle  . Physical activity:    Days per week: Not on file    Minutes per session: Not on file  . Stress: Not on file  Relationships  . Social connections:    Talks on phone: Not on file    Gets together: Not on file    Attends religious service: Not on file    Active member of club or organization: Not on file    Attends meetings of clubs or organizations: Not on file    Relationship status: Not on file  Other Topics Concern  . Not on file  Social History Narrative  . Not on file   Additional Social History:    Pain Medications: none Prescriptions: noncompliant Over the Counter: none History of alcohol / drug use?: No history of alcohol /  drug abuse Longest period of sobriety (when/how long): unknown Negative Consequences of Use: (none) Withdrawal Symptoms: (none)                    Sleep: Good  Appetite:  Good  Current Medications: Current Facility-Administered Medications  Medication Dose Route Frequency Provider Last Rate Last Dose  . acetaminophen (TYLENOL) tablet 650 mg  650 mg Oral Q6H PRN [name], [name], MD      . alum & mag hydroxide-simeth (MAALOX/MYLANTA) 200-200-20 MG/5ML suspension 30 mL  30 mL Oral Q4H PRN [name], [name], MD      . divalproex (DEPAKOTE) DR tablet 500 mg  500 mg Oral Q12H [name], [name], MD   500 mg at 08/28/17 3790  . hydrOXYzine (ATARAX/VISTARIL) tablet 50 mg  50 mg Oral TID PRN [name], [name], MD      . magnesium hydroxide (MILK OF MAGNESIA) suspension 30 mL  30 mL Oral Daily PRN [name], [name],  MD      . nicotine (NICODERM CQ - dosed in mg/24 hours) patch 21 mg  21 mg Transdermal Daily [name], [name], MD   21 mg at 08/22/17 0901  . OLANZapine (ZYPREXA) tablet 5 mg  5 mg Oral Q6H PRN Miyanna Wiersma R, MD      . paliperidone (INVEGA SUSTENNA) injection 156 mg  156 mg Intramuscular Once Oluwadamilola Rosamond R, MD      . paliperidone (INVEGA) 24 hr tablet 9 mg  9 mg Oral Daily [name], MD   9 mg at 08/28/17 0923  . traZODone (DESYREL) tablet 50 mg  50 mg Oral QHS PRN Marylin Crosby, MD   50 mg at 08/27/17 2141    Lab Results: No results found for this or any previous visit (from the past 48 hour(s)).  Blood Alcohol level:  Lab Results  Component Value Date   ETH <10 08/15/2017   ETH <5 24/02/7352    Metabolic Disorder Labs: Lab Results  Component Value Date   HGBA1C 5.4 08/17/2017   MPG 108.28 08/17/2017   MPG 105.41 01/12/2017   No results found for: PROLACTIN Lab Results  Component Value Date   CHOL 131 08/17/2017   TRIG 111 08/17/2017   HDL 46 08/17/2017   CHOLHDL 2.8 08/17/2017   VLDL 22 08/17/2017   LDLCALC 63 08/17/2017   LDLCALC 88 01/12/2017    Physical Findings: AIMS: Facial and Oral Movements Muscles of Facial Expression: None, normal Lips and Perioral Area: None, normal Jaw: None, normal Tongue: None, normal,Extremity Movements Upper (arms, wrists, hands, fingers): None, normal Lower (legs, knees, ankles, toes): None, normal, Trunk Movements Neck, shoulders, hips: None, normal, Overall Severity Severity of abnormal movements (highest score from questions above): None, normal Incapacitation due to abnormal movements: None, normal Patient's awareness of abnormal movements (rate only patient's report): No Awareness, Dental Status Current problems with teeth and/or dentures?: No Does patient usually wear dentures?: No  CIWA:    COWS:     Musculoskeletal: Strength & Muscle Tone: within normal limits Gait & Station: normal Patient leans:  N/A  Psychiatric Specialty Exam: Physical Exam  Nursing note and vitals reviewed.   Review of Systems  All other systems reviewed and are negative.   Blood pressure 127/70, pulse 81, temperature 97.6 F (36.4 C), temperature source Oral, resp. rate 16, height 5\' 9"  (1.753 m), weight 77.6 kg (171 lb), SpO2 100 %.Body mass index is 25.25 kg/m.  General Appearance: Casual  Eye Contact:  Good  Speech:  Slow  Volume:  Decreased  Mood:  Euthymic  Affect:  Appropriate  Thought Process:  Coherent and Goal Directed  Orientation:  Full (Time, Place, and Person)  Thought Content:  Delusions but not perseverative on this  Suicidal Thoughts:  No  Homicidal Thoughts:  No  Memory:  Immediate;   Fair  Judgement:  Fair  Insight:  Fair  Psychomotor Activity:  Normal  Concentration:  Concentration: Fair  Recall:  [employer] of Knowledge:  Fair  Language:  Fair  Akathisia:  No      Assets:  Resilience  ADL's:  Intact  Cognition: Normal  Sleep:  Number of Hours: 6.75     Treatment Plan Summary: 62 yo male admitted due to decompensation. HE has been improving on Invega and much less psychotic. He still has some mild delusions but none are dangerous. He is not perseverative on those delusions. He is caring for his ADLs well and has been very calm on the unit. He has been compliant with all medications. He will get second dose of [place] today and plan for likely discharge tomorrow.   Plan:  Schizophrenia -He received Kirt Boys on 3/20. Next injection of 156 mg will be given today. Next injection due on 4/23 -Continue Depakote 500 mg BID  Dispo -He follows with [name]. He will stay with his brother on discharge. Possible discharge tomorrow.   Marylin Crosby, MD 08/28/2017, 2:18 PM

## 2017-08-28 NOTE — Discharge Instructions (Signed)
Next Invega Injection 234 mg due on 09/25/17

## 2017-08-28 NOTE — BHH Group Notes (Signed)
  08/28/2017  Time: 0900  Type of Therapy and Topic: Group Therapy: Goals Group: SMART Goals   Participation Level:  Did Not Attend   Description of Group:   The purpose of a daily goals group is to assist and guide patients in setting recovery/wellness-related goals. The objective is to set goals as they relate to the crisis in which they were admitted. Patients will be using SMART goal modalities to set measurable goals. Characteristics of realistic goals will be discussed and patients will be assisted in setting and processing how one will reach their goal. Facilitator will also assist patients in applying interventions and coping skills learned in psycho-education groups to the SMART goal and process how one will achieve defined goal.   Therapeutic Goals:  -Patients will develop and document one goal related to or their crisis in which brought them into treatment.  -Patients will be guided by LCSW using SMART goal setting modality in how to set a measurable, attainable, realistic and time sensitive goal.  -Patients will process barriers in reaching goal.  -Patients will process interventions in how to overcome and successful in reaching goal.   Patient's Goal: Pt was invited to attend group but chose not to attend. CSW will continue to encourage pt to attend group throughout their admission.    Therapeutic Modalities:  Motivational Interviewing  Cognitive Behavioral Therapy  Crisis Intervention Model  SMART goals setting  Alden Hipp, MSW, LCSW 08/28/2017 9:35 AM

## 2017-08-28 NOTE — Plan of Care (Signed)
  Problem: Elimination: Goal: Will not experience complications related to bowel motility Outcome: Progressing   Problem: Elimination: Goal: Will not experience complications related to urinary retention Outcome: Progressing   Problem: Skin Integrity: Goal: Risk for impaired skin integrity will decrease Outcome: Progressing   Problem: Activity: Goal: Will verbalize the importance of balancing activity with adequate rest periods Outcome: Progressing   Problem: Education: Goal: Will be free of psychotic symptoms Outcome: Progressing

## 2017-08-28 NOTE — Progress Notes (Signed)
CSW contacted pt's brother, Babs Sciara Ratliff at [phone], to discuss pt's discharge tomorrow. Pt's brother reported he would be able to pick pt up, and would call CSW back with an exact time he would be able to pick pt up tomorrow, 08/29/17. CSW will continue to coordinate with pt's brother as needed for updates/discharge planning.   Alden Hipp, MSW, LCSW 08/28/2017 11:25 AM

## 2017-08-28 NOTE — Plan of Care (Signed)
  Problem: Elimination: Goal: Will not experience complications related to bowel motility Outcome: Progressing Goal: Will not experience complications related to urinary retention Outcome: Progressing   Problem: Skin Integrity: Goal: Risk for impaired skin integrity will decrease Outcome: Progressing   Problem: Activity: Goal: Will verbalize the importance of balancing activity with adequate rest periods Outcome: Progressing   Problem: Education: Goal: Will be free of psychotic symptoms Outcome: Progressing Goal: Knowledge of the prescribed therapeutic regimen will improve Outcome: Progressing   Problem: Coping: Goal: Ability to cope will improve Outcome: Progressing Goal: Ability to verbalize feelings will improve Outcome: Progressing   Problem: Health Behavior/Discharge Planning: Goal: Compliance with prescribed medication regimen will improve Outcome: Progressing   Problem: Nutritional: Goal: Ability to achieve adequate nutritional intake will improve Outcome: Progressing   Problem: Role Relationship: Goal: Ability to communicate needs accurately will improve Outcome: Progressing Goal: Ability to interact with others will improve Outcome: Progressing   Problem: Safety: Goal: Ability to redirect hostility and anger into socially appropriate behaviors will improve Outcome: Progressing Goal: Ability to remain free from injury will improve Outcome: Progressing   Problem: Self-Care: Goal: Ability to participate in self-care as condition permits will improve Outcome: Progressing   Problem: Self-Concept: Goal: Ability to verbalize positive feelings about self will improve Outcome: Progressing   Problem: Activity: Goal: Sleeping patterns will improve Outcome: Progressing  Denies SI/HI/AVH.  Pleasant and cooperative.  Support and encouragement offered. Safety maintained.

## 2017-08-29 NOTE — BHH Suicide Risk Assessment (Signed)
[facility] Discharge Suicide Risk Assessment   Principal Problem: Schizoaffective disorder, bipolar type [facility]) Discharge Diagnoses:  Patient Active Problem List   Diagnosis Date Noted  . Schizoaffective disorder, bipolar type ([place]) [F25.0] 01/24/2017    Priority: High  . Tardive dyskinesia [G24.01] 01/24/2017  . Tobacco use disorder [F17.200] 11/29/2016    Total Time spent with patient: 15 minutes  Plus 20 minutes of medication reconciliation, discharge planning, and discahrge documentation   Mental Status Per Nursing Assessment::   On Admission:  NA  Demographic Factors:  Male and Unemployed  Loss Factors: NA  Historical Factors: Impulsivity  Risk Reduction Factors:   Religious beliefs about death, Living with another person, especially a relative, Positive social support and Positive therapeutic relationship  Continued Clinical Symptoms:  Schizophrenia:   Paranoid or undifferentiated type  Cognitive Features That Contribute To Risk:  None    Suicide Risk:  Minimal: No identifiable suicidal ideation.   Follow-up [facility].. Go on 08/30/2017.   Why:  Please attend your follow up appointment in the office on Thursday 08/30/2017 at 10:30am. Also, you will have another appointment that is scheduled for Monday 09/03/2017 at 10:30PM. Thank you.  Contact information: [place] [address] [phone]           Plan Of Care/Follow-up recommendations: Follow up with ACT team tomorrow  Marylin Crosby, MD 08/29/2017, 9:55 AM

## 2017-08-29 NOTE — Progress Notes (Signed)
  [facility] Adult Case Management Discharge Plan :  Will you be returning to the same living situation after discharge:  Yes,  returning home. At discharge, do you have transportation home?: Yes,  brother Do you have the ability to pay for your medications: Yes,  [employer]  Release of information consent forms completed and in the chart;  Patient's signature needed at discharge.  Patient to Follow up at: Follow-up [facility].. Go on 08/30/2017.   Why:  Please attend your follow up appointment in the office on Thursday 08/30/2017 at 10:30am. Also, you will have another appointment that is scheduled for Monday 09/03/2017 at 10:30PM. Thank you.  Contact information: [place]  [address] [phone]           Next level of care provider has access to [place] and Suicide Prevention discussed: Yes,  with pt and his brother  Have you used any form of tobacco in the last 30 days? (Cigarettes, Smokeless Tobacco, Cigars, and/or Pipes): Yes  Has patient been referred to the Quitline?: Patient refused referral  Patient has been referred for addiction treatment: N/A  Alden Hipp, LCSW 08/29/2017, 9:27 AM

## 2017-08-29 NOTE — Plan of Care (Signed)
Patient slept for Estimated Hours of 6.30; Precautionary checks every 15 minutes for safety maintained, room free of safety hazards, patient sustains no injury or falls during this shift.  Problem: Elimination: Goal: Will not experience complications related to bowel motility Outcome: Progressing Goal: Will not experience complications related to urinary retention Outcome: Progressing   Problem: Skin Integrity: Goal: Risk for impaired skin integrity will decrease Outcome: Progressing   Problem: Activity: Goal: Will verbalize the importance of balancing activity with adequate rest periods Outcome: Progressing   Problem: Education: Goal: Will be free of psychotic symptoms Outcome: Progressing Goal: Knowledge of the prescribed therapeutic regimen will improve Outcome: Progressing   Problem: Coping: Goal: Ability to cope will improve Outcome: Progressing Goal: Ability to verbalize feelings will improve Outcome: Progressing   Problem: Health Behavior/Discharge Planning: Goal: Compliance with prescribed medication regimen will improve Outcome: Progressing   Problem: Nutritional: Goal: Ability to achieve adequate nutritional intake will improve Outcome: Progressing   Problem: Role Relationship: Goal: Ability to communicate needs accurately will improve Outcome: Progressing Goal: Ability to interact with others will improve Outcome: Progressing   Problem: Safety: Goal: Ability to redirect hostility and anger into socially appropriate behaviors will improve Outcome: Progressing Goal: Ability to remain free from injury will improve Outcome: Progressing   Problem: Self-Care: Goal: Ability to participate in self-care as condition permits will improve Outcome: Progressing   Problem: Self-Concept: Goal: Ability to verbalize positive feelings about self will improve Outcome: Progressing   Problem: Activity: Goal: Sleeping patterns will improve Outcome: Progressing

## 2017-08-29 NOTE — Discharge Summary (Signed)
Physician Discharge Summary Note  Patient:  Dustin [name] is an 62 y.o., male MRN:  [id number] DOB:  06-18-1955 Patient phone:  [phone] (home)  Patient address:   [place] [address],  Total Time spent with patient: 15 minutes  Plus 20 minutes of medication reconciliation, discharge planning, and discharge documentation  Date of Admission:  08/16/2017 Date of Discharge: 08/29/17  Reason for Admission:  62 yo male admitted due to decompensation of schizophrenia and medication noncompliance. [facility] stating that he has been refusing medications, very agitated, making threats including alleging that he had threatened to rape someone. He was very disorganized and hyperreligious in the ED. Per chart review, he was hospitalized on our unit in 2018 with very similary symptoms. He was on Prolixin LAI and oral Abilify. It was noted that he does have TD and recommend second generation anti-psychotic.             Upon assessment today, he is calm and pleasant. He is very disorganized and answers most questions with an irrelevant response. He states, "Dr. Loni Muse she wants me to believe something that I don't believe. Like schizophrenia and bipolar." He states, "They claim they have papers but I will go home." He states that he lives with his brother in a trailer. He does state that he gets an injection but is not sure what he gets. He becomes very disorganized as interview goes on. He states, "She was sitting near the bible. Its not in the bible, I believe in the holocaust warriors." He is hyperreligious. He denies AH and states, "There is no such thing as voices." He denies feeling depressed or suicidal. Denies Homicidal thoughts. Difficult to get much history from patient.            I spoke with his RN and psychiatrist with [name]. They state that the only medication he is on is Abilify 400 mg monthly but he often  refuses medications. His next injection was due on 3/13. He was on Prolixin in the past but he was refusing to get the shots every 2 weeks so they agreed on Abilify. He has not been doing well on just this medication. At baseline, he is very hyperreligious and somewhat disorganized. He believes that all medications are poison. He is very paranoid and delusional. He writes on every surface of his walls. He sometimes makes sexual statements. He does live with his brother who is deaf and does not speak. She is open to a trial of [place]. She is in agreement that he requires long acting injectable and may possible need two anti-psychotics.     Principal Problem: Schizoaffective disorder, bipolar type [facility]) Discharge Diagnoses: Patient Active Problem List   Diagnosis Date Noted  . Schizoaffective disorder, bipolar type ([place]) [F25.0] 01/24/2017    Priority: High  . Tardive dyskinesia [G24.01] 01/24/2017  . Tobacco use disorder [F17.200] 11/29/2016    Past Psychiatric History: See h&P  Past Medical History:  Past Medical History:  Diagnosis Date  . Schizophrenia ([place])    History reviewed. No pertinent surgical history. Family History: History reviewed. No pertinent family history. Family Psychiatric  History: See H&P Social History:  Social History   Substance and Sexual Activity  Alcohol Use Yes  . Alcohol/week: 4.2 oz  . Types: 7 Shots of liquor per week   Comment: 1 drink every night- gin  Social History   Substance and Sexual Activity  Drug Use No    Social History   Socioeconomic History  . Marital status: Single    Spouse name: Not on file  . Number of children: Not on file  . Years of education: Not on file  . Highest education level: Not on file  Occupational History  . Not on file  Social Needs  . Financial resource strain: Not on file  . Food insecurity:    Worry: Not on file    Inability: Not on file  . Transportation needs:    Medical: Not on file     Non-medical: Not on file  Tobacco Use  . Smoking status: Current Every Day Smoker    Packs/day: 0.50    Years: 15.00    Pack years: 7.50  . Smokeless tobacco: Never Used  Substance and Sexual Activity  . Alcohol use: Yes    Alcohol/week: 4.2 oz    Types: 7 Shots of liquor per week    Comment: 1 drink every night- gin  . Drug use: No  . Sexual activity: Never    Birth control/protection: None  Lifestyle  . Physical activity:    Days per week: Not on file    Minutes per session: Not on file  . Stress: Not on file  Relationships  . Social connections:    Talks on phone: Not on file    Gets together: Not on file    Attends religious service: Not on file    Active member of club or organization: Not on file    Attends meetings of clubs or organizations: Not on file    Relationship status: Not on file  Other Topics Concern  . Not on file  Social History Narrative  . Not on file    Hospital Course:  Pt was started on [place]. He was given both initial injections while in the hospital. He was also restarted on home dose of Depakote 500 mg BID. HE was very calm and cooperative on the unit. Disorganization overall improved significantly. He did have some vague delusions about having an Army pen in his tooth but he was no longer perseverative on this. He spent a lot of his time reading the bible and did have some hyper-religious qualities but was able to redirect and was not perseverative on this. He did not make any threatening statements during hospitalization. He did not appear to be responding to internal stimuli during hospitalization. His hygiene was good during hospitalization. He was adequately caring for his ADLs. On day of discharge, pt received his 2nd INvega injection of 156 mg. HE tolerated this well. He as rambling at times but overall more organized than admission. He was looking forward to going home today. He was interacting well with peers and his roommate. He was  reading the bible this morning. He will follow up with ACT team tomorrow. He did have delusion again this morning about having a pen in his teeth however, no unsafe or dangerous delusions expressed. He does not appear to be at imminent danger to himself or others and no longer meets IVC criteria.   The patient is at low risk of imminent suicide. Patient denied thoughts, intent, or plan for harm to self or others, expressed significant future orientation, and expressed an ability to mobilize assistance for his needs. He is presently void of any contributing psychiatric symptoms, cognitive difficulties, or substance use which would elevate his risk for lethality. Chronic  risk for lethality is elevated in light of chronic and baseline psychosis. The chronic risk is presently mitigated by his ongoing desire and engagement in [facility] treatment and mobilization of support from family and friends. Chronic risk may elevate if he experiences any significant loss or worsening of symptoms, which can be managed and monitored through outpatient providers. At this time,a cute risk for lethality is low and he is stable for ongoing outpatient management.   Modifiable risk factors were addressed during this hospitalization through appropriate pharmacotherapy and establishment of outpatient follow-up treatment. Some risk factors for suicide are situational (i.e. Unstable housing) or related personality pathology (i.e. Poor coping mechanisms) and thus cannot be further mitigated by continued hospitalization in this setting.    Physical Findings: AIMS: Facial and Oral Movements Muscles of Facial Expression: None, normal Lips and Perioral Area: None, normal Jaw: None, normal Tongue: None, normal,Extremity Movements Upper (arms, wrists, hands, fingers): None, normal Lower (legs, knees, ankles, toes): None, normal, Trunk Movements Neck, shoulders, hips: None, normal, Overall Severity Severity of abnormal movements (highest  score from questions above): None, normal Incapacitation due to abnormal movements: None, normal Patient's awareness of abnormal movements (rate only patient's report): No Awareness, Dental Status Current problems with teeth and/or dentures?: No Does patient usually wear dentures?: No  CIWA:    COWS:     Musculoskeletal: Strength & Muscle Tone: within normal limits Gait & Station: normal Patient leans: N/A  Psychiatric Specialty Exam: Physical Exam  Nursing note and vitals reviewed.   Review of Systems  All other systems reviewed and are negative.   Blood pressure 112/85, pulse 97, temperature (!) 97.4 F (36.3 C), temperature source Oral, resp. rate 18, height 5\' 9"  (1.753 m), weight 77.6 kg (171 lb), SpO2 100 %.Body mass index is 25.25 kg/m.  General Appearance: Casual  Eye Contact:  Good  Speech:  Clear and Coherent  Volume:  Normal  Mood:  Euthymic  Affect:  Appropriate  Thought Process:  Coherent  Orientation:  Full (Time, Place, and Person)  Thought Content:  Delusions but overall organized  Suicidal Thoughts:  No  Homicidal Thoughts:  No  Memory:  Immediate;   Fair  Judgement:  Impaired  Insight:  Shallow  Psychomotor Activity:  Normal  Concentration:  Concentration: Fair  Recall:  [place] of Knowledge:  Fair  Language:  Fair  Akathisia:  No      Assets:  Resilience  ADL's:  Intact  Cognition:  WNL  Sleep:  Number of Hours: 6.3     Have you used any form of tobacco in the last 30 days? (Cigarettes, Smokeless Tobacco, Cigars, and/or Pipes): Yes  Has this patient used any form of tobacco in the last 30 days? (Cigarettes, Smokeless Tobacco, Cigars, and/or Pipes) Yes, refused medications  Blood Alcohol level:  Lab Results  Component Value Date   ETH <10 08/15/2017   ETH <5 32/99/2426    Metabolic Disorder Labs:  Lab Results  Component Value Date   HGBA1C 5.4 08/17/2017   MPG 108.28 08/17/2017   MPG 105.41 01/12/2017   No results found for:  PROLACTIN Lab Results  Component Value Date   CHOL 131 08/17/2017   TRIG 111 08/17/2017   HDL 46 08/17/2017   CHOLHDL 2.8 08/17/2017   VLDL 22 08/17/2017   LDLCALC 63 08/17/2017   [place] 88 01/12/2017    See Psychiatric Specialty Exam and Suicide Risk Assessment completed by Attending Physician prior to discharge.  Discharge destination:  Home  Is patient on multiple antipsychotic therapies at discharge:  No   Has Patient had three or more failed trials of antipsychotic monotherapy by history:  No  Recommended Plan for Multiple Antipsychotic Therapies: NA  Discharge Instructions    Increase activity slowly   Complete by:  As directed      Allergies as of 08/29/2017   No Known Allergies     Medication List    STOP taking these medications   ARIPiprazole 15 MG tablet Commonly known as:  ABILIFY   fluPHENAZine decanoate 25 MG/ML injection Commonly known as:  PROLIXIN   zolpidem 10 MG tablet Commonly known as:  AMBIEN     TAKE these medications     Indication  divalproex 500 MG DR tablet Commonly known as:  DEPAKOTE Take 1 tablet (500 mg total) by mouth every 12 (twelve) hours.  Indication:  schizoaffective bipolar type   paliperidone 234 MG/1.5ML Susp injection Commonly known as:  INVEGA SUSTENNA Inject 234 mg into the muscle once for 1 dose. Start taking on:  09/25/2017  Indication:  Schizoaffective Disorder      Follow-up Florien.. Go on 08/30/2017.   Why:  Please attend your follow up appointment in the office on Thursday 08/30/2017 at 10:30am. Also, you will have another appointment that is scheduled for Monday 09/03/2017 at 10:30PM. Thank you.  Contact information: [place] [address] [phone]           Follow-up recommendations: Follow up with ACT team  Signed: Marylin Crosby, MD 08/29/2017, 9:56 AM

## 2017-08-29 NOTE — Progress Notes (Signed)
Patient ID: Dustin [name], male   DOB: 1956/02/28, 62 y.o.   MRN: [id number] Pleasant on approach, visible in the Day Room socializing with peers, no odd and bizarre behaviors; awaiting Invega IM from Pharm, thoughts are organized, appropriate in mood, affect and behavior; A&ox3, denied pain, medication compliant; received Trazodone 50 mg at bedtime.

## 2017-08-29 NOTE — Progress Notes (Signed)
Denies SI/HI/AVH.   Discharge instructions given, verbalized understanding.  Prescription given and personal belongings returned.  Escorted off unit by this Probation officer to meet brother to travel home.

## 2017-08-31 ENCOUNTER — Emergency Department
Admission: EM | Admit: 2017-08-31 | Discharge: 2017-09-03 | Disposition: A | Discharge status: Other Acute Inpatient Hospital | Payer: Medicare Other | Attending: Emergency Medicine | Admitting: Emergency Medicine

## 2017-08-31 DIAGNOSIS — R451 Restlessness and agitation: Secondary | ICD-10-CM | POA: Diagnosis present

## 2017-08-31 DIAGNOSIS — F17200 Nicotine dependence, unspecified, uncomplicated: Secondary | ICD-10-CM | POA: Diagnosis present

## 2017-08-31 DIAGNOSIS — G2401 Drug induced subacute dyskinesia: Secondary | ICD-10-CM | POA: Diagnosis present

## 2017-08-31 DIAGNOSIS — F250 Schizoaffective disorder, bipolar type: Secondary | ICD-10-CM | POA: Diagnosis not present

## 2017-08-31 DIAGNOSIS — F209 Schizophrenia, unspecified: Secondary | ICD-10-CM | POA: Insufficient documentation

## 2017-08-31 DIAGNOSIS — F29 Unspecified psychosis not due to a substance or known physiological condition: Secondary | ICD-10-CM | POA: Diagnosis not present

## 2017-08-31 LAB — CBC
HCT: 48 % (ref 40.0–52.0)
Hemoglobin: 15.3 g/dL (ref 13.0–18.0)
MCH: 26.8 pg (ref 26.0–34.0)
MCHC: 32 g/dL (ref 32.0–36.0)
MCV: 83.8 fL (ref 80.0–100.0)
PLATELETS: 164 10*3/uL (ref 150–440)
RBC: 5.73 MIL/uL (ref 4.40–5.90)
RDW: 14 % (ref 11.5–14.5)
WBC: 10.6 10*3/uL (ref 3.8–10.6)

## 2017-08-31 LAB — COMPREHENSIVE METABOLIC PANEL
ALK PHOS: 87 U/L (ref 38–126)
ALT: 14 U/L — AB (ref 17–63)
AST: 19 U/L (ref 15–41)
Albumin: 3.6 g/dL (ref 3.5–5.0)
Anion gap: 8 (ref 5–15)
BILIRUBIN TOTAL: 0.7 mg/dL (ref 0.3–1.2)
BUN: 18 mg/dL (ref 6–20)
CALCIUM: 9.1 mg/dL (ref 8.9–10.3)
CO2: 25 mmol/L (ref 22–32)
CREATININE: 1.17 mg/dL (ref 0.61–1.24)
Chloride: 108 mmol/L (ref 101–111)
Glucose, Bld: 126 mg/dL — ABNORMAL HIGH (ref 65–99)
Potassium: 4.2 mmol/L (ref 3.5–5.1)
Sodium: 141 mmol/L (ref 135–145)
TOTAL PROTEIN: 6.7 g/dL (ref 6.5–8.1)

## 2017-08-31 LAB — ACETAMINOPHEN LEVEL: Acetaminophen (Tylenol), Serum: <10 ug/mL — ABNORMAL LOW (ref 10–30)

## 2017-08-31 LAB — SALICYLATE LEVEL

## 2017-08-31 LAB — ETHANOL

## 2017-08-31 MED ORDER — LORAZEPAM 2 MG/ML IJ SOLN
2.0000 mg | Freq: Once | INTRAMUSCULAR | Status: AC
  Administered 2017-08-31: 2 mg via INTRAMUSCULAR

## 2017-08-31 MED ORDER — ZIPRASIDONE MESYLATE 20 MG IM SOLR
20.0000 mg | Freq: Once | INTRAMUSCULAR | Status: AC
  Administered 2017-08-31: 20 mg via INTRAMUSCULAR

## 2017-08-31 MED ORDER — DIPHENHYDRAMINE HCL 50 MG/ML IJ SOLN
INTRAMUSCULAR | Status: AC
  Administered 2017-08-31: 25 mg via INTRAMUSCULAR
  Filled 2017-08-31: qty 1

## 2017-08-31 MED ORDER — ZIPRASIDONE MESYLATE 20 MG IM SOLR
INTRAMUSCULAR | Status: AC
  Administered 2017-08-31: 20 mg via INTRAMUSCULAR
  Filled 2017-08-31: qty 20

## 2017-08-31 MED ORDER — LORAZEPAM 2 MG/ML IJ SOLN
INTRAMUSCULAR | Status: AC
  Administered 2017-08-31: 2 mg via INTRAMUSCULAR
  Filled 2017-08-31: qty 1

## 2017-08-31 MED ORDER — ZIPRASIDONE MESYLATE 20 MG IM SOLR
20.0000 mg | Freq: Once | INTRAMUSCULAR | Status: AC
  Administered 2017-08-31: 20 mg via INTRAMUSCULAR
  Filled 2017-08-31: qty 20

## 2017-08-31 MED ORDER — DIPHENHYDRAMINE HCL 50 MG/ML IJ SOLN
25.0000 mg | Freq: Once | INTRAMUSCULAR | Status: AC
  Administered 2017-08-31: 25 mg via INTRAMUSCULAR

## 2017-08-31 NOTE — ED Notes (Signed)
Pt. Unwilling to give vital or lab work.  Pt. Refusing to follow any commands.

## 2017-08-31 NOTE — ED Provider Notes (Signed)
[facility] Emergency Department Provider Note ____________________________________________   I have reviewed the triage vital signs and the triage nursing note.  HISTORY  Chief Complaint Psychiatric Evaluation   Historian Level 5 Caveat History Limited by severe agitation with psychiatric illness  HPI Dustin [name] is a 62 y.o. male   with a history of schizophrenia who presents agitated and with involuntary commitment papers for history of schizophrenia with agitation today.  Patient continues to be severely agitated and is not redirectable.  He is not cooperative with any history.    Past Medical History:  Diagnosis Date  . Schizophrenia [facility])     Patient Active Problem List   Diagnosis Date Noted  . Schizoaffective disorder, bipolar type ([place]) 01/24/2017  . Tardive dyskinesia 01/24/2017  . Tobacco use disorder 11/29/2016    No past surgical history on file.  Prior to Admission medications   Medication Sig Start Date End Date Taking? Authorizing Provider  divalproex (DEPAKOTE) 500 MG DR tablet Take 1 tablet (500 mg total) by mouth every 12 (twelve) hours. 08/28/17   [name], [name], MD  paliperidone (INVEGA SUSTENNA) 234 MG/1.5ML SUSP injection Inject 234 mg into the muscle once for 1 dose. 09/25/17 09/25/17  [name], [name], MD    No Known Allergies  No family history on file.  Social History Social History   Tobacco Use  . Smoking status: Current Every Day Smoker    Packs/day: 0.50    Years: 15.00    Pack years: 7.50  . Smokeless tobacco: Never Used  Substance Use Topics  . Alcohol use: Yes    Alcohol/week: 4.2 oz    Types: 7 Shots of liquor per week    Comment: 1 drink every night- gin  . Drug use: No    Review of Systems  Unable to obtain due to agitation  ____________________________________________   PHYSICAL EXAM:  VITAL SIGNS: ED Triage Vitals [08/31/17 1300]  Enc Vitals Group     BP      Pulse      Resp    Temp      Temp src      SpO2      Weight 160 lb (72.6 kg)     Height 6\' 1"  (1.854 m)     Head Circumference      Peak Flow      Pain Score      Pain Loc      Pain Edu?      Excl. in [place]?      Constitutional: Yelling and threatening staff. HEENT      Head: Normocephalic and atraumatic.      Eyes: Conjunctivae are normal. Pupils equal and round.       Ears:         Nose: No congestion/rhinnorhea.      Mouth/Throat: Mucous membranes are moist.      Neck: No stridor. Cardiovascular/Chest:  Respiratory: Normal respiratory effort without tachypnea nor retractions.. Gastrointestinal:  Genitourinary/rectal: Musculoskeletal: Will be 4 extremities.  Walking on his own. Neurologic:  No gross or focal neurologic deficits are appreciated. Skin:  Skin is without erythema. Psychiatric: Agitated.  ____________________________________________  LABS (pertinent positives/negatives) I, Lisa Roca, MD the attending physician have reviewed the labs noted below.  Labs Reviewed  COMPREHENSIVE METABOLIC PANEL  ETHANOL  SALICYLATE LEVEL  ACETAMINOPHEN LEVEL  CBC  URINE DRUG SCREEN, QUALITATIVE ([place] ONLY)    __________________________________________  PROCEDURES  Procedure(s) performed: None  Procedures  Critical Care  performed: CRITICAL CARE Performed by: Lisa Roca   Total critical care time: 30 minutes  Critical care time was exclusive of separately billable procedures and treating other patients.  Critical care was necessary to treat or prevent imminent or life-threatening deterioration.  Critical care was time spent personally by me on the following activities: development of treatment plan with patient and/or surrogate as well as nursing, discussions with consultants, evaluation of patient's response to treatment, examination of patient, obtaining history from patient or surrogate, ordering and performing treatments and interventions, ordering and review of laboratory  studies, ordering and review of radiographic studies, pulse oximetry and re-evaluation of patient's condition.    ____________________________________________  ED COURSE / ASSESSMENT AND PLAN  Pertinent labs & imaging results that were available during my care of the patient were reviewed by me and considered in my medical decision making (see chart for details).     Patient arrived extremely agitated with a history of schizophrenia and psychosis.  Patient unable to be redirected, required intramuscular medications for agitation management for safety of patient and staff.  Patient sound like has a history of similar.  I reviewed patient's involuntary commitment paperwork and have continued this.  Consults placed to TTS and tele-psychiatry.  Patient care to be transferred to ED physician at shift change.  Labs pending.  Consults pending.  CONSULTATIONS: TTS and tele-psychiatry consulted.   Patient / Family / Caregiver informed of clinical course, medical decision-making process, and agree with plan.     ___________________________________________   FINAL CLINICAL IMPRESSION(S) / ED DIAGNOSES   Final diagnoses:  Psychosis, unspecified psychosis type ([place])      ___________________________________________         Note: This dictation was prepared with Dragon dictation. Any transcriptional errors that result from this process are unintentional    Lisa Roca, MD 08/31/17 1452

## 2017-08-31 NOTE — ED Notes (Signed)
PT IVC/PENDING SOC CONSULT/PT UNCOOPERATIVE AT THIS TIME TO COMPLETE SOC.

## 2017-08-31 NOTE — ED Notes (Signed)
Pt. Requested and was given blanket.

## 2017-08-31 NOTE — ED Triage Notes (Signed)
Patient presents to the ED with [employer] And IVC paperwork.  Patient shouting loudly in the ED, cursing and swinging his fists in the air.  [employer] and [employer] officers escorted patient back to treatment room.  Patient refused to put on hospital bracelet.  States, "you're not putting me in the hospital."  IVC paperwork was taken out by [name] for aggression by patient.

## 2017-08-31 NOTE — BH Assessment (Signed)
Assessment Note  Dustin [name] is an 62 y.o. male who presents the ER via Law Enforcement due to aggressive behaviors and agitation. Per [name] Dustin [name]-[phone]) they received a phone call from the [facility] stating the patient was at their facility being disruptive. When ACTT staff arrived, patient's agitation increased and started yelling at the staff member. Patient stated he did not want to go back to the hospital and he couldn't make him. Patient also accused the staff of using his psychic powers to control his thoughts. Per ACTT, "I've never seen the patient like this before. I know at baseline he's delusional but today I've never seen him like this." Staff member had planned to transport patient home from Dustin [name]. However, due to his behaviors, someone in the office called law enforcement and he was transported to the ER. ACTT reports, the patient's brother told them, when he was discharged from [facility] (08/30/2017), it appeared he had improved. However, when they were no longer on hospital property, the patient "changed up and turned."  Information for this assessment was provided by patient's ACT Team. Patient was unable to participate in the interview. He was giving IM medications and was sleep.   Diagnosis: Schizophrenia  Past Medical History:  Past Medical History:  Diagnosis Date  . Schizophrenia ([place])     No past surgical history on file.  Family History: No family history on file.  Social History:  reports that he has been smoking.  He has a 7.50 pack-year smoking history. He has never used smokeless tobacco. He reports that he drinks about 4.2 oz of alcohol per week. He reports that he does not use drugs.  Additional Social History:  Alcohol / Drug Use Pain Medications: See PTA Prescriptions: See PTA Over the Counter: See PTA History of alcohol / drug use?: No history of alcohol / drug abuse Longest period of sobriety (when/how long):  n/a  CIWA:   COWS:    Allergies: No Known Allergies  Home Medications:  (Not in a hospital admission)  OB/GYN Status:  No LMP for male patient.  General Assessment Data Location of Assessment: [facility] ED TTS Assessment: In system Is this a Tele or Face-to-Face Assessment?: Face-to-Face Is this an Initial Assessment or a Re-assessment for this encounter?: Initial Assessment Marital status: Single Is patient pregnant?: No Pregnancy Status: No Living Arrangements: Other relatives(Lives with brother) Can pt return to current living arrangement?: Yes Admission Status: Involuntary Is patient capable of signing voluntary admission?: No(Under IVC) Referral Source: Self/Family/Friend Insurance type: [facility] Medicare  Medical Screening Exam ([place]) Medical Exam completed: Yes  Crisis Care Plan Living Arrangements: Other relatives(Lives with brother) Legal Guardian: Other:(Self) Name of Psychiatrist: [place] Name of Therapist: [name]  Education Status Is patient currently in school?: No Is the patient employed, unemployed or receiving disability?: Receiving disability income  Risk to self with the past 6 months Suicidal Ideation: No Has patient been a risk to self within the past 6 months prior to admission? : No Suicidal Intent: No Has patient had any suicidal intent within the past 6 months prior to admission? : No Is patient at risk for suicide?: No Suicidal Plan?: No Has patient had any suicidal plan within the past 6 months prior to admission? : No Access to Means: No What has been your use of drugs/alcohol within the last 12 months?: Reports of none Previous Attempts/Gestures: No Other Self Harm Risks: Reports of none Triggers  for Past Attempts: None known Intentional Self Injurious Behavior: None Family Suicide History: No Recent stressful life event(s): Other (Comment) Persecutory voices/beliefs?: No Depression: No Depression Symptoms:  Feeling angry/irritable, Isolating Substance abuse history and/or treatment for substance abuse?: No Suicide prevention information given to non-admitted patients: Not applicable  Risk to Others within the past 6 months Homicidal Ideation: No Does patient have any lifetime risk of violence toward others beyond the six months prior to admission? : Yes (comment) Thoughts of Harm to Others: No Current Homicidal Intent: No Current Homicidal Plan: No Access to Homicidal Means: No Identified Victim: None Reported History of harm to others?: No Assessment of Violence: On admission Violent Behavior Description: Agitated upon arrival to the ER Does patient have access to weapons?: No Criminal Charges Pending?: No Does patient have a court date: No Is patient on probation?: No  Psychosis Hallucinations: None noted Delusions: Persecutory, Grandiose  Mental Status Report Appearance/Hygiene: Unremarkable, In scrubs Eye Contact: Unable to Assess Motor Activity: Unable to assess Speech: Unable to assess Level of Consciousness: Sleeping Mood: Other (Comment)(UTA) Affect: Unable to Assess Anxiety Level: (UTA) Thought Processes: Unable to Assess Judgement: Unable to Assess Orientation: Unable to assess Obsessive Compulsive Thoughts/Behaviors: Unable to Assess  Cognitive Functioning Concentration: Unable to Assess Memory: Unable to Assess Is patient IDD: No Is patient DD?: No Insight: Unable to Assess Impulse Control: Unable to Assess Appetite: (UTA) Have you had any weight changes? : No Change Sleep: Unable to Assess Total Hours of Sleep: (UTA) Vegetative Symptoms: Unable to Assess  ADLScreening [facility] Assessment Services) Patient's cognitive ability adequate to safely complete daily activities?: Yes Patient able to express need for assistance with ADLs?: Yes Independently performs ADLs?: Yes (appropriate for developmental age)  Prior Inpatient Therapy Prior Inpatient Therapy:  Yes Prior Therapy Dates: 08/2017, 01/2017 & 11/2016 Prior Therapy Facilty/Provider(s): [facility] Reason for Treatment: Psychosis  Prior Outpatient Therapy Prior Outpatient Therapy: Yes Prior Therapy Dates: Currently Prior Therapy Facilty/Provider(s): [facility] Reason for Treatment: Psychosis Does patient have an ACCT team?: Yes([facility]) Does patient have Intensive In-House Services?  : No Does patient have [facility] services? : No Does patient have P4CC services?: No  ADL Screening (condition at time of admission) Patient's cognitive ability adequate to safely complete daily activities?: Yes Is the patient deaf or have difficulty hearing?: No Does the patient have difficulty seeing, even when wearing glasses/contacts?: No Does the patient have difficulty concentrating, remembering, or making decisions?: No Patient able to express need for assistance with ADLs?: Yes Does the patient have difficulty dressing or bathing?: No Independently performs ADLs?: Yes (appropriate for developmental age) Does the patient have difficulty walking or climbing stairs?: No Weakness of Legs: None Weakness of Arms/Hands: None     Therapy Consults (therapy consults require a physician order) PT Evaluation Needed: No OT Evalulation Needed: No SLP Evaluation Needed: No Abuse/Neglect Assessment (Assessment to be complete while patient is alone) Abuse/Neglect Assessment Can Be Completed: Yes Physical Abuse: Denies Verbal Abuse: Denies Sexual Abuse: Denies Exploitation of patient/patient's resources: Denies Self-Neglect: Denies Values / Beliefs Cultural Requests During Hospitalization: None Spiritual Requests During Hospitalization: None Consults Spiritual Care Consult Needed: No Social Work Consult Needed: No      Additional Information 1:1 In Past 12 Months?: No CIRT Risk: No Elopement Risk: No Does patient have medical clearance?: Yes  Child/Adolescent  Assessment Running Away Risk: Denies(Patient is an adult)  Disposition:  Disposition Initial Assessment Completed for this Encounter: Yes  On Site Evaluation by:  Reviewed with Physician:    Gunnar Fusi MS, LCAS, LPC, [place], CCSI Therapeutic Triage Specialist 08/31/2017 4:23 PM

## 2017-08-31 NOTE — ED Notes (Signed)
Patient uncooperative and verbally aggressive toward staff.  Patient refusing to change into scrubs, will not wear arm band, and will not allow lab draws.  Patient yelling and threatening staff.

## 2017-08-31 NOTE — ED Notes (Signed)
Pt. Allowed this nurse to draw blood from hand.  Pt. Requested and was given meal tray and water.

## 2017-08-31 NOTE — ED Notes (Signed)
Unable to arouse patient for [facility] consult.

## 2017-09-01 DIAGNOSIS — F250 Schizoaffective disorder, bipolar type: Secondary | ICD-10-CM

## 2017-09-01 MED ORDER — NICOTINE 21 MG/24HR TD PT24
21.0000 mg | MEDICATED_PATCH | Freq: Every day | TRANSDERMAL | Status: DC
  Filled 2017-09-01: qty 1

## 2017-09-01 MED ORDER — DIVALPROEX SODIUM 500 MG PO DR TAB
500.0000 mg | DELAYED_RELEASE_TABLET | Freq: Three times a day (TID) | ORAL | Status: DC
  Administered 2017-09-01 – 2017-09-02 (×4): 500 mg via ORAL
  Filled 2017-09-01 (×5): qty 1

## 2017-09-01 MED ORDER — OLANZAPINE 5 MG PO TBDP
15.0000 mg | ORAL_TABLET | Freq: Every day | ORAL | Status: DC
  Administered 2017-09-01: 15 mg via ORAL
  Filled 2017-09-01 (×2): qty 3

## 2017-09-01 MED ORDER — OLANZAPINE 10 MG PO TABS
ORAL_TABLET | ORAL | Status: AC
  Filled 2017-09-01: qty 1

## 2017-09-01 MED ORDER — PALIPERIDONE PALMITATE 234 MG/1.5ML IM SUSP: 234.0000 mg | INTRAMUSCULAR | Status: DC

## 2017-09-01 NOTE — ED Notes (Signed)
Pt. Up to use bathroom.  Pt. Unsteady on feet.  Pt. Assisted back into bed.

## 2017-09-01 NOTE — BH Assessment (Signed)
Per Dr. Bary Leriche, patient meets inpatient criteria. Patient will transfer to [facility] when an appropriate bed becomes available.

## 2017-09-01 NOTE — ED Notes (Signed)
Pt has been cooperative. Speech is disorganized with flight of ideas. Pt often speaks about the Bible and the Holocaust. Maintained on 15 minute checks and observation by security camera for safety.

## 2017-09-01 NOTE — ED Notes (Signed)
Pt up at this time using restroom with no assistance

## 2017-09-01 NOTE — ED Notes (Signed)
Pt. Has been accepted to the [facility], pt. Will be accepted and moved 3/31.

## 2017-09-01 NOTE — ED Notes (Signed)
Pt. Calm and cooperative and requesting a bible.  Pt. Was given soft cover bible, pt. Very grateful.

## 2017-09-01 NOTE — ED Notes (Signed)
Pt. Up and walking to bathroom.  Pt. Requested light on in room so he could eat.  Pt. Returned to room with steady gait.

## 2017-09-01 NOTE — ED Notes (Signed)
Pt requesting light on so he could eat, this tech honored his request

## 2017-09-01 NOTE — Consult Note (Signed)
Dustin [name] returns to the ER just one day following discharge from behavioral medicine. We will add Zyprexa and depakote to his regimen and admit to psychiatry tomorrow.

## 2017-09-01 NOTE — ED Notes (Signed)
Patient resting quietly in room. No noted distress or abnormal behaviors noted. Will continue 15 minute checks and observation by security camera for safety. 

## 2017-09-01 NOTE — ED Provider Notes (Signed)
-----------------------------------------   11:53 PM on 09/01/2017 -----------------------------------------   Blood pressure 113/74, pulse 62, temperature 97.8 F (36.6 C), temperature source Oral, resp. rate 17, height 6\' 1"  (1.854 m), weight 72.6 kg (160 lb), SpO2 99 %.  The patient had no acute events since last update.  Calm and cooperative at this time.  Disposition is pending Psychiatry/Behavioral Medicine team recommendations.     Orbie Pyo, MD 09/01/17 [phone]

## 2017-09-01 NOTE — ED Notes (Signed)
Pt spoke with [place]. Pt will be recommended for inpatient treatment.
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
## 2017-09-02 NOTE — ED Notes (Signed)
Patient given graham crackers, peanut butter, and water per request by this EDT.
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
## 2017-09-02 NOTE — ED Notes (Signed)
Pt lying in bed sleeping soundly. No distress noted. Nothing needed from staff at this time. RN notified

## 2017-09-02 NOTE — ED Notes (Signed)
Pt. Up and using bathroom with steady gait.

## 2017-09-03 ENCOUNTER — Emergency Department
Admission: EM | Admit: 2017-09-03 | Discharge: 2017-09-03 | Discharge status: Absent without leave | Payer: Medicare Other

## 2017-09-03 ENCOUNTER — Other Ambulatory Visit: Payer: Self-pay

## 2017-09-03 ENCOUNTER — Encounter: Payer: Self-pay | Admitting: Behavioral Health

## 2017-09-03 ENCOUNTER — Inpatient Hospital Stay
Admission: AD | Admit: 2017-09-03 | Discharge: 2017-09-19 | DRG: 885 | Disposition: A | Discharge status: Home / Self Care | Payer: Medicare Other | Attending: Psychiatry | Admitting: Psychiatry

## 2017-09-03 DIAGNOSIS — Z79899 Other long term (current) drug therapy: Secondary | ICD-10-CM | POA: Diagnosis not present

## 2017-09-03 DIAGNOSIS — F17210 Nicotine dependence, cigarettes, uncomplicated: Secondary | ICD-10-CM | POA: Diagnosis present

## 2017-09-03 DIAGNOSIS — I959 Hypotension, unspecified: Secondary | ICD-10-CM | POA: Diagnosis not present

## 2017-09-03 DIAGNOSIS — F200 Paranoid schizophrenia: Principal | ICD-10-CM | POA: Diagnosis present

## 2017-09-03 LAB — URINE DRUG SCREEN, QUALITATIVE (ARMC ONLY)
AMPHETAMINES, UR SCREEN: NOT DETECTED
BENZODIAZEPINE, UR SCRN: NOT DETECTED
Barbiturates, Ur Screen: NOT DETECTED
Cannabinoid 50 Ng, Ur ~~LOC~~: NOT DETECTED
Cocaine Metabolite,Ur ~~LOC~~: NOT DETECTED
MDMA (Ecstasy)Ur Screen: NOT DETECTED
METHADONE SCREEN, URINE: NOT DETECTED
OPIATE, UR SCREEN: NOT DETECTED
Phencyclidine (PCP) Ur S: NOT DETECTED
Tricyclic, Ur Screen: NOT DETECTED

## 2017-09-03 MED ORDER — DIVALPROEX SODIUM 500 MG PO DR TAB
1000.0000 mg | DELAYED_RELEASE_TABLET | Freq: Every day | ORAL | Status: DC
  Administered 2017-09-03 – 2017-09-18 (×16): 1000 mg via ORAL
  Filled 2017-09-03 (×17): qty 2

## 2017-09-03 MED ORDER — NICOTINE 21 MG/24HR TD PT24: 21.0000 mg | MEDICATED_PATCH | Freq: Every day | TRANSDERMAL | Status: DC

## 2017-09-03 MED ORDER — NICOTINE 21 MG/24HR TD PT24
21.0000 mg | MEDICATED_PATCH | Freq: Every day | TRANSDERMAL | Status: DC
  Filled 2017-09-03 (×5): qty 1

## 2017-09-03 MED ORDER — PALIPERIDONE PALMITATE 234 MG/1.5ML IM SUSP
234.0000 mg | INTRAMUSCULAR | Status: DC
  Filled 2017-09-03: qty 1.5

## 2017-09-03 MED ORDER — DIVALPROEX SODIUM 500 MG PO DR TAB
500.0000 mg | DELAYED_RELEASE_TABLET | ORAL | Status: DC
  Administered 2017-09-04 – 2017-09-19 (×16): 500 mg via ORAL
  Filled 2017-09-03 (×16): qty 1

## 2017-09-03 MED ORDER — DIVALPROEX SODIUM 500 MG PO DR TAB: 500.0000 mg | DELAYED_RELEASE_TABLET | Freq: Three times a day (TID) | ORAL | Status: DC

## 2017-09-03 MED ORDER — OLANZAPINE 5 MG PO TBDP
20.0000 mg | ORAL_TABLET | Freq: Every day | ORAL | Status: DC
  Administered 2017-09-03: 20 mg via ORAL
  Filled 2017-09-03: qty 2
  Filled 2017-09-03: qty 4

## 2017-09-03 MED ORDER — PALIPERIDONE PALMITATE 234 MG/1.5ML IM SUSP: 234.0000 mg | Freq: Once | INTRAMUSCULAR | Status: DC

## 2017-09-03 MED ORDER — ALUM & MAG HYDROXIDE-SIMETH 200-200-20 MG/5ML PO SUSP: 30.0000 mL | ORAL | Status: DC | PRN

## 2017-09-03 MED ORDER — ADULT MULTIVITAMIN W/MINERALS CH
1.0000 | ORAL_TABLET | Freq: Every day | ORAL | Status: DC
  Administered 2017-09-03 – 2017-09-19 (×17): 1 via ORAL
  Filled 2017-09-03 (×26): qty 1

## 2017-09-03 MED ORDER — MAGNESIUM HYDROXIDE 400 MG/5ML PO SUSP: 30.0000 mL | Freq: Every day | ORAL | Status: DC | PRN

## 2017-09-03 MED ORDER — OLANZAPINE 5 MG PO TBDP: 15.0000 mg | ORAL_TABLET | Freq: Every day | ORAL | Status: DC

## 2017-09-03 MED ORDER — HYDROXYZINE HCL 25 MG PO TABS
25.0000 mg | ORAL_TABLET | Freq: Three times a day (TID) | ORAL | Status: DC | PRN
  Administered 2017-09-10 – 2017-09-12 (×2): 25 mg via ORAL
  Filled 2017-09-03 (×2): qty 1

## 2017-09-03 MED ORDER — TRAZODONE HCL 100 MG PO TABS
100.0000 mg | ORAL_TABLET | Freq: Every evening | ORAL | Status: DC | PRN
  Administered 2017-09-04: 100 mg via ORAL
  Filled 2017-09-03: qty 1

## 2017-09-03 MED ORDER — ACETAMINOPHEN 325 MG PO TABS
650.0000 mg | ORAL_TABLET | Freq: Four times a day (QID) | ORAL | Status: DC | PRN
  Administered 2017-09-12 – 2017-09-17 (×4): 650 mg via ORAL
  Filled 2017-09-03 (×4): qty 2

## 2017-09-03 NOTE — BHH Suicide Risk Assessment (Signed)
[facility] Admission Suicide Risk Assessment   Nursing information obtained from:  Patient Demographic factors:  Male, Low socioeconomic status Current Mental Status:  NA Loss Factors:  NA Historical Factors:  NA Risk Reduction Factors:  Living with another person, especially a relative  Total Time spent with patient: 45 minutes Principal Problem: Paranoid schizophrenia ([place]) Diagnosis:   Patient Active Problem List   Diagnosis Date Noted  . Paranoid schizophrenia ([place]) [F20.0] 09/03/2017    Priority: High  . Schizoaffective disorder, bipolar type ([place]) [F25.0] 01/24/2017    Priority: High  . Tardive dyskinesia [G24.01] 01/24/2017  . Tobacco use disorder [F17.200] 11/29/2016   Subjective Data: See H&P  Continued Clinical Symptoms:  Alcohol Use Disorder Identification Test Final Score (AUDIT): 4 The "Alcohol Use Disorders Identification Test", Guidelines for Use in Primary Care, Second Edition.  World Pharmacologist [facility]). Score between 0-7:  no or low risk or alcohol related problems. Score between 8-15:  moderate risk of alcohol related problems. Score between 16-19:  high risk of alcohol related problems. Score 20 or above:  warrants further diagnostic evaluation for alcohol dependence and treatment.   CLINICAL FACTORS:   Schizophrenia:   Paranoid or undifferentiated type        COGNITIVE FEATURES THAT CONTRIBUTE TO RISK:  Polarized thinking and Thought constriction (tunnel vision)    SUICIDE RISK:   Minimal: No identifiable suicidal ideation.  PLAN OF CARE: See H&P  I certify that inpatient services furnished can reasonably be expected to improve the patient's condition.   Marylin Crosby, MD 09/03/2017, 1:57 PM

## 2017-09-03 NOTE — ED Provider Notes (Signed)
-----------------------------------------   3:12 AM on 09/03/2017 -----------------------------------------   Blood pressure 109/77, pulse 89, temperature 98.4 F (36.9 C), temperature source Oral, resp. rate 20, height 6\' 1"  (1.854 m), weight 72.6 kg (160 lb), SpO2 100 %.  The patient had no acute events since last update.  Calm and cooperative at this time.  Disposition is pending Psychiatry/Behavioral Medicine team recommendations.     Darel Hong, MD 09/03/17 [phone]

## 2017-09-03 NOTE — H&P (Signed)
Psychiatric Admission Assessment Adult  Patient Identification: Dustin [name] MRN:  [id number] Date of Evaluation:  09/03/2017 Chief Complaint:  Agitation Principal Diagnosis: Paranoid schizophrenia ([place]) Diagnosis:   Patient Active Problem List   Diagnosis Date Noted  . Schizoaffective disorder, bipolar type ([place]) [F25.0] 01/24/2017    Priority: High  . Paranoid schizophrenia ([place]) [F20.0] 09/03/2017  . Tardive dyskinesia [G24.01] 01/24/2017  . Tobacco use disorder [F17.200] 11/29/2016   History of Present Illness: 62 yo male admitted due to agitation and psychosis. He was just discharged from the hospital where he was admitted from 08/16/17-08/29/17. He was under my care during that time. He was very calm and cooperative during that admission. He had no evidence of agitation, irritability or anger during that time. He was caring well for his ADLs and was compliant with his medications. He was started on [place] during that hospitalization. He does have baseline delusions and disorganization at times. Per chart, ACT team received call from [place] stating that patient was there being very disruptive. When staff arrived there, he was yelling and more agitated. He was accusing staff of using psychic powers to control his thoughts. The police were called and brought to the ED. In the ED, Depakote was increased and he was started on Zyprexa.   Upon evaluation today, Pt is calm and cooperative. He is very disorganized in conversation. He is attempting to explain what had happened but is very disorganized. HE is hyper-religious and continuously pointing to his bible. He continues with the delusion that he has a pen in his tooth to help him hear. He asks this provider to whisper during interview because "Your tone and frequency is a Physiological scientist and I can hear in my head." Pt unable to answer many questions rationally.   Associated Signs/Symptoms: Depression Symptoms:  Denies feeling  depressed (Hypo) Manic Symptoms:  Flight of Ideas, Impulsivity, Irritable Mood, Anxiety Symptoms:  [place] Psychotic Symptoms:  Delusions, Paranoia, PTSD Symptoms: Negative Total Time spent with patient: 30 minutes  Past Psychiatric History: Long history of mental illness. He follows with [name] ACT team. He was on Prolixin and Abilify in the past. He has had many inpatient hospitalizations-last was recently.   Is the patient at risk to self? No.  Has the patient been a risk to self in the past 6 months? No.  Has the patient been a risk to self within the distant past? No.  Is the patient a risk to others? Yes.    Has the patient been a risk to others in the past 6 months? No.  Has the patient been a risk to others within the distant past? No.   Prior Inpatient Therapy:   Prior Outpatient Therapy:    Alcohol Screening: 1. How often do you have a drink containing alcohol?: 4 or more times a week 2. How many drinks containing alcohol do you have on a typical day when you are drinking?: 1 or 2 3. How often do you have six or more drinks on one occasion?: Never AUDIT-C Score: 4 4. How often during the last year have you found that you were not able to stop drinking once you had started?: Never 5. How often during the last year have you failed to do what was normally expected from you becasue of drinking?: Never 6. How often during the last year have you needed a first drink in the morning to get yourself going after a heavy drinking session?: Never 7. How  often during the last year have you had a feeling of guilt of remorse after drinking?: Never 8. How often during the last year have you been unable to remember what happened the night before because you had been drinking?: Never 9. Have you or someone else been injured as a result of your drinking?: No 10. Has a relative or friend or a doctor or another health worker been concerned about your drinking or suggested you cut down?:  No Alcohol Use Disorder Identification Test Final Score (AUDIT): 4 Intervention/Follow-up: AUDIT Score <7 follow-up not indicated Substance Abuse History in the last 12 months:  No., "one shot of Gin a day."  Consequences of Substance Abuse: Negative Previous Psychotropic Medications: Yes  Psychological Evaluations: Yes  Past Medical History:  Past Medical History:  Diagnosis Date  . Schizophrenia ([place])    No past surgical history on file. Family History: No family history on file. Family Psychiatric  History: Unknown Tobacco Screening: Tobacco use, Select all that apply: 4 or less cigarettes per day Are you interested in Tobacco Cessation Medications?: Yes, will notify MD for an order Counseled patient on smoking cessation including recognizing danger situations, developing coping skills and basic information about quitting provided: Refused/Declined practical counseling Social History: Pt is his own legal guardian. He lives with his brother in a trailer. He is on disability  Allergies:  No Known Allergies Lab Results: No results found for this or any previous visit (from the past 48 hour(s)).  Blood Alcohol level:  Lab Results  Component Value Date   ETH <10 08/31/2017   ETH <10 53/61/4431    Metabolic Disorder Labs:  Lab Results  Component Value Date   HGBA1C 5.4 08/17/2017   MPG 108.28 08/17/2017   MPG 105.41 01/12/2017   No results found for: PROLACTIN Lab Results  Component Value Date   CHOL 131 08/17/2017   TRIG 111 08/17/2017   HDL 46 08/17/2017   CHOLHDL 2.8 08/17/2017   VLDL 22 08/17/2017   LDLCALC 63 08/17/2017   LDLCALC 88 01/12/2017    Current Medications: Current Facility-Administered Medications  Medication Dose Route Frequency Provider Last Rate Last Dose  . acetaminophen (TYLENOL) tablet 650 mg  650 mg Oral Q6H PRN Pucilowska, Jolanta B, MD      . alum & mag hydroxide-simeth (MAALOX/MYLANTA) 200-200-20 MG/5ML suspension 30 mL  30 mL Oral Q4H PRN  Pucilowska, Jolanta B, MD      . Derrill Memo ON 09/04/2017] divalproex (DEPAKOTE) DR tablet 500 mg  500 mg Oral BH-q7a Orlena Garmon, [name], MD       And  . divalproex (DEPAKOTE) DR tablet 1,000 mg  1,000 mg Oral QHS Dorisann Schwanke, [name], MD      . hydrOXYzine (ATARAX/VISTARIL) tablet 25 mg  25 mg Oral TID PRN Pucilowska, Jolanta B, MD      . magnesium hydroxide (MILK OF MAGNESIA) suspension 30 mL  30 mL Oral Daily PRN Pucilowska, Jolanta B, MD      . multivitamin with minerals tablet 1 tablet  1 tablet Oral Daily Pucilowska, Jolanta B, MD      . Derrill Memo ON 09/04/2017] nicotine (NICODERM CQ - dosed in mg/24 hours) patch 21 mg  21 mg Transdermal Daily Pucilowska, Jolanta B, MD      . OLANZapine zydis (ZYPREXA) disintegrating tablet 15 mg  15 mg Oral QHS Pucilowska, Jolanta B, MD      . Derrill Memo ON 09/28/2017] paliperidone (INVEGA SUSTENNA) injection 234 mg  234 mg Intramuscular Q28 days Pucilowska,  Jolanta B, MD      . traZODone (DESYREL) tablet 100 mg  100 mg Oral QHS PRN Pucilowska, Jolanta B, MD       PTA Medications: Medications Prior to Admission  Medication Sig Dispense Refill Last Dose  . divalproex (DEPAKOTE) 500 MG DR tablet Take 1 tablet (500 mg total) by mouth every 12 (twelve) hours. (Patient not taking: Reported on 09/01/2017) 60 tablet 0 Not Taking at Unknown time  . Multiple Vitamin (MULTIVITAMIN) tablet Take 1 tablet by mouth daily.   Past Week at Unknown time  . Omega-3 Fatty Acids (OMEGA-3 FISH OIL PO) Take 1 capsule by mouth daily.   Past Week at Unknown time  . [START ON 09/25/2017] paliperidone (INVEGA SUSTENNA) 234 MG/1.5ML SUSP injection Inject 234 mg into the muscle once for 1 dose. (Patient not taking: Reported on 09/01/2017) 1.5 mL 0 Not Taking at Unknown time    Musculoskeletal: Strength & Muscle Tone: within normal limits Gait & Station: normal Patient leans: Backward  Psychiatric Specialty Exam: Physical Exam  Nursing note and vitals reviewed.   ROS  There were no vitals taken for this  visit.There is no height or weight on file to calculate BMI.  General Appearance: Casual  Eye Contact:  Fair  Speech:  Garbled  Volume:  Decreased  Mood:  Euthymic  Affect:  Constricted  Thought Process:  Disorganized and Irrelevant  Orientation:  Full (Time, Place, and Person)  Thought Content:  Illogical, Delusions and Paranoid Ideation  Suicidal Thoughts:  No  Homicidal Thoughts:  No  Memory:  Immediate;   Poor  Judgement:  Impaired  Insight:  Lacking  Psychomotor Activity:  Normal  Concentration:  Concentration: Poor  Recall:  Poor  Fund of Knowledge:  Poor  Language:  Fair  Akathisia:  No      Assets:  Resilience  ADL's:  Intact  Cognition:  WNL  Sleep:       Treatment Plan Summary: 62 yo male with history of schizophrenia readmitted due to agitation. He is very disorganized today. He was started on Zyprexa in the ED. He is also on [place].  Plan: Schizoaffective -Increase Depakote to 500 mg qam and 1000 mg qhs -Increase Zyprexa to 20 mg qhs -He is on [place]. Next dose due on 09/25/17  Order EKG, Lipid panel, TSH, and A1C checked recently at last admission Dispo -He follow with [name] ACT team.   Observation Level/Precautions:  15 minute checks  Laboratory:  Done in ED. Will check Depakote level in 3 days  Psychotherapy:    Medications:    Consultations:    Discharge Concerns:    Estimated LOS: 5-7 fays  Other:     Physician Treatment Plan for Primary Diagnosis: Paranoid schizophrenia ([place]) Long Term Goal(s): Improvement in symptoms so as ready for discharge  Short Term Goals: Ability to verbalize feelings will improve  Physician Treatment Plan for Secondary Diagnosis: Active Problems:   Paranoid schizophrenia ([place])   I certify that inpatient services furnished can reasonably be expected to improve the patient's condition.    Marylin Crosby, MD 4/1/201912:53 PM

## 2017-09-03 NOTE — Plan of Care (Signed)
Patient is alert to self and place. Patient denies SI, and HI. Patient has disorganized thinking, with hallucinations of religiosity. Patient unable to give clear and logical statements about reason for admission into hospital. Patient States, he has a chip in his tooth which connects with God and Psalms. Patient also states people are jealous of him because of the way he is built. Patient asked the nurse not to state words with the letter "J" or "G" sound and to hold throat when talking to him. Patient also very flirtatious. Patient skin intact just dry, no contraband found on person. Patient oriented to the unit, stating, " I need to go and talk to some people." Patient seem to be very happy to be here on the behavioral unit. Attitude is pleasant and compliant, although patient refused to sign paper work. Patient stated," I signed that stuff before, but I will participate."  Patient states he wants a new ACTT team, he is currently with [facility]. Nurse will continue to monitor. Safety checks continue Q 15 minutes. Problem: Education: Goal: Knowledge of [place] [facility] Education information/materials will improve Outcome: Not Progressing Goal: Emotional status will improve Outcome: Not Progressing Goal: Mental status will improve Outcome: Not Progressing   Problem: Activity: Goal: Interest or engagement in activities will improve Outcome: Not Progressing   Problem: Coping: Goal: Ability to identify and develop effective coping behavior will improve Outcome: Not Progressing

## 2017-09-03 NOTE — Tx Team (Signed)
Initial Treatment Plan 09/03/2017 2:17 PM Dustin [name] VVK:[id number]    PATIENT STRESSORS: Financial difficulties Marital or family conflict Medication change or noncompliance   PATIENT STRENGTHS: Active sense of humor Communication skills Supportive family/friends   PATIENT IDENTIFIED PROBLEMS:   Medication compliance  Living arrangements  New ACTT TEAM               DISCHARGE CRITERIA:  Ability to meet basic life and health needs Adequate post-discharge living arrangements Verbal commitment to aftercare and medication compliance  PRELIMINARY DISCHARGE PLAN: Attend PHP/IOP Outpatient therapy Placement in alternative living arrangements Return to previous living arrangement  PATIENT/FAMILY INVOLVEMENT: This treatment plan has been presented to and reviewed with the patient, [name], and/or family member.  The patient and family have been given the opportunity to ask questions and make suggestions.  Geraldo Docker, RN 09/03/2017, 2:17 PM

## 2017-09-03 NOTE — ED Notes (Signed)
This RN called behavioral health to learn plan of care.  Charge RN to call this RN back.

## 2017-09-04 MED ORDER — HALOPERIDOL 5 MG PO TABS
5.0000 mg | ORAL_TABLET | Freq: Every day | ORAL | Status: DC
  Administered 2017-09-04 – 2017-09-05 (×2): 5 mg via ORAL
  Filled 2017-09-04 (×2): qty 1

## 2017-09-04 NOTE — Progress Notes (Signed)
First am patient irritable.  When asked how he is doing states "notheing is good".  Affect flat and pensive.  As day progress patient less irritable.  Visible in the milieu.  Attending groups. Mumbles when talks making it difficult to understand.  Thought processes disorganized.  Support and encouargement offered.  Safety rounds maintained.

## 2017-09-04 NOTE — Progress Notes (Signed)
Recreation Therapy Notes  Date: 04.02.2019  Time: 9:30 am  Location: Craft Room  Behavioral response: N/A  Intervention Topic: Communication  Discussion/Intervention: Patient did not attend group.  Clinical Observations/Feedback:  Patient did not attend group.  Jorian [place]ho[place] LRT/CTRS          Dyshawn Cangelosi 09/04/2017 10:49 AM

## 2017-09-04 NOTE — Progress Notes (Signed)
Patient ID: Dustin [name], male   DOB: 1956/02/28, 62 y.o.   MRN: [id number] Disorganized thought contents and processes, irrelevantly talking, nonsensical, polite at this time, forward a little by touching my arm anytime he makes a point, he was not presented like this during his hospital stay. Denied pain, complied with medications.

## 2017-09-04 NOTE — BHH Suicide Risk Assessment (Signed)
BHH INPATIENT:  Family/Significant Other Suicide Prevention Education  Suicide Prevention Education:  Contact Attempts: [employer],  Brother, [phone] has been identified by the patient as the family member/significant other with whom the patient will be residing, and identified as the person(s) who will aid the patient in the event of a mental health crisis.  With written consent from the patient, two attempts were made to provide suicide prevention education, prior to and/or following the patient's discharge.  We were unsuccessful in providing suicide prevention education.  A suicide education pamphlet was given to the patient to share with family/significant other.  Date and time of first attempt:CSW called [facility] on 09/04/2017 at 11am to complete SPE. The receiver hung up twice on the CSW. CSW unable to leave a voicemail.  Date and time of second attempt:  Darin Engels 09/04/2017, 11:02 AM

## 2017-09-04 NOTE — BHH Counselor (Signed)
Adult Comprehensive Assessment  Patient ID: Dustin [name], male   DOB: 1955-10-21, 62 y.o.   MRN: [id number]  Information Source: Information source: Patient   Current Stressors:  Educational / Learning stressors: No stressors identified  Employment / Job issues: No stressors identified  Family Relationships: No stressors identified  Museum/gallery curator / Lack of resources (include bankruptcy): No stressors identified  Housing / Lack of housing: No stressors identified  Physical health (include injuries & life threatening diseases): No stressors identified  Social relationships: No stressors identified  Substance abuse: Alcohol use, patient doesn't identify this as a problem  Bereavement / Loss: No stressors identified    Living/Environment/Situation:  Living Arrangements: Other relatives- Brother and sister-in-law Living conditions (as described by patient or guardian): They are fine  How long has patient lived in current situation?: Since 1994 What is atmosphere in current home: Comfortable, Supportive   Family History:  Marital status: Single Does patient have children?: No   Childhood History:  By whom was/is the patient raised?: Mother Description of patient's relationship with caregiver when they were a child: It was alright  Patient's description of current relationship with people who raised him/her: Mother deceased  How were you disciplined when you got in trouble as a child/adolescent?: Whoopings Does patient have siblings?: Yes Number of Siblings: 5 Description of patient's current relationship with siblings: 2 sisters, 3 brothers - do not see them often Did patient suffer any verbal/emotional/physical/sexual abuse as a child?: No Did patient suffer from severe childhood neglect?: No Has patient ever been sexually abused/assaulted/raped as an adolescent or adult?: No Was the patient ever a victim of a crime or a disaster?: No Witnessed domestic violence?: No Has patient been  effected by domestic violence as an adult?: No   Education:  Highest grade of school patient has completed: 9th grade  Currently a student?: No Name of school: n/a Learning disability?: No   Employment/Work Situation:   Employment situation: On disability Why is patient on disability: Mental health  How long has patient been on disability: Since 1993 What is the longest time patient has a held a job?: "It varies" Where was the patient employed at that time?: Unknown  Has patient ever been in the [employer]?: No Has patient ever served in combat?: No Did You Receive Any Psychiatric Treatment/Services While in the [employer]?: No Are There Guns or Other Weapons in [place]?: No Are These Psychologist, educational?:  (N/A)   Financial Resources:   Financial resources: Marine scientist SSDI Does patient have a Programmer, applications or guardian?: No   Alcohol/Substance Abuse:   What has been your use of drugs/alcohol within the last 12 months?: Pt reports alcohol use.  If attempted suicide, did drugs/alcohol play a role in this?: No Alcohol/Substance Abuse Treatment Hx: Denies past history Has alcohol/substance abuse ever caused legal problems?: No   Social Support System:   Patient's Community Support System: Good Describe Community Support System: Patient stated that his brother helps him alot - and he maintain on his own. 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 Type of faith/religion: Christianity  How does patient's faith help to cope with current illness?: Read the bible, talk to pastors   Leisure/Recreation:   Leisure and Hobbies: None identified    Strengths/Needs:   What things does the patient do well?: unknown In what areas does patient struggle / problems for patient: Possibly alcohol, medication compliance.   Discharge Plan:   Does patient have access to transportation?:  Yes- Says that his brother will come and get him Will patient be returning to same living situation  after discharge?: Yes with bother Currently receiving community mental health services: Yes- [facility] ACTT Team.  Does patient have financial barriers related to discharge medications?: No    Summary/Recommendations:   Summary and Recommendations (to be completed by the evaluator): Patient is a 62 year old African American male admitted involuntarily and diagnosed with Paranoid schizophrenia. Patient lives in [place] with his brother. Patient receives ACTT services with [employer]. He reports using Alcohol. His UDS was negative for all substances. Patients affect was labile. He presents with some paranoia and bizarre thinking. At discharge, patient will return back home with his brother and continue to follow up with his ACTT Team. While here, patient will benefit from crisis stabilization, medication evaluation, group therapy and psychoeducation, in addition to case management for discharge planning. At discharge, it is recommended that patient remain compliant with the established discharge plan and continue treatment  Darin Engels. 09/04/2017

## 2017-09-04 NOTE — Progress Notes (Signed)
[facility] MD Progress Note  09/04/2017 1:58 PM Dustin [name]  MRN:  [id number]   Subjective:  Pt has been calm on the unit. He is still disorganized but slightly less so than yesterday. He is still hyper-religious. Her mumbles and difficult to understand him at times. HE is still no sure what brought him to the hospital and states that someone "at the mental health clinic disrespected me." He has not had any agitation on the unit. He states that he slept better with the medications last night.   Principal Problem: Paranoid schizophrenia ([place]) Diagnosis:   Patient Active Problem List   Diagnosis Date Noted  . Paranoid schizophrenia ([place]) [F20.0] 09/03/2017    Priority: High  . Schizoaffective disorder, bipolar type ([place]) [F25.0] 01/24/2017    Priority: High  . Tardive dyskinesia [G24.01] 01/24/2017  . Tobacco use disorder [F17.200] 11/29/2016   Total Time spent with patient: 20 minutes  Past Psychiatric History: See H&P  Past Medical History:  Past Medical History:  Diagnosis Date  . Schizophrenia ([place])    History reviewed. No pertinent surgical history. Family History: History reviewed. No pertinent family history. Family Psychiatric  History: See H&P Social History:  Social History   Substance and Sexual Activity  Alcohol Use Yes  . Alcohol/week: 4.2 oz  . Types: 7 Shots of liquor per week   Comment: 1 drink every night- gin     Social History   Substance and Sexual Activity  Drug Use No    Social History   Socioeconomic History  . Marital status: Single    Spouse name: Not on file  . Number of children: Not on file  . Years of education: Not on file  . Highest education level: Not on file  Occupational History  . Not on file  Social Needs  . Financial resource strain: Not on file  . Food insecurity:    Worry: Not on file    Inability: Not on file  . Transportation needs:    Medical: Not on file    Non-medical: Not on file  Tobacco Use  . Smoking status: Current  Every Day Smoker    Packs/day: 0.50    Years: 15.00    Pack years: 7.50  . Smokeless tobacco: Never Used  Substance and Sexual Activity  . Alcohol use: Yes    Alcohol/week: 4.2 oz    Types: 7 Shots of liquor per week    Comment: 1 drink every night- gin  . Drug use: No  . Sexual activity: Never    Birth control/protection: None  Lifestyle  . Physical activity:    Days per week: Not on file    Minutes per session: Not on file  . Stress: Not on file  Relationships  . Social connections:    Talks on phone: Not on file    Gets together: Not on file    Attends religious service: Not on file    Active member of club or organization: Not on file    Attends meetings of clubs or organizations: Not on file    Relationship status: Not on file  Other Topics Concern  . Not on file  Social History Narrative  . Not on file   Additional Social History:                         Sleep: Fair  Appetite:  Fair  Current Medications: Current Facility-Administered Medications  Medication Dose Route Frequency Provider  Last Rate Last Dose  . acetaminophen (TYLENOL) tablet 650 mg  650 mg Oral Q6H PRN Pucilowska, Jolanta B, MD      . alum & mag hydroxide-simeth (MAALOX/MYLANTA) 200-200-20 MG/5ML suspension 30 mL  30 mL Oral Q4H PRN Pucilowska, Jolanta B, MD      . divalproex (DEPAKOTE) DR tablet 500 mg  500 mg Oral BH-q7a Shadiamond Koska, [name], MD   500 mg at 09/04/17 3532   And  . divalproex (DEPAKOTE) DR tablet 1,000 mg  1,000 mg Oral QHS Laine Giovanetti R, MD   1,000 mg at 09/03/17 2129  . haloperidol (HALDOL) tablet 5 mg  5 mg Oral QHS Kerby Borner R, MD      . hydrOXYzine (ATARAX/VISTARIL) tablet 25 mg  25 mg Oral TID PRN Pucilowska, Jolanta B, MD      . magnesium hydroxide (MILK OF MAGNESIA) suspension 30 mL  30 mL Oral Daily PRN Pucilowska, Jolanta B, MD      . multivitamin with minerals tablet 1 tablet  1 tablet Oral Daily Pucilowska, Jolanta B, MD   1 tablet at 09/04/17 0811  . nicotine  (NICODERM CQ - dosed in mg/24 hours) patch 21 mg  21 mg Transdermal Daily Pucilowska, Jolanta B, MD      . Dustin [name] ON 09/28/2017] paliperidone (INVEGA SUSTENNA) injection 234 mg  234 mg Intramuscular Q28 days Pucilowska, Jolanta B, MD      . traZODone (DESYREL) tablet 100 mg  100 mg Oral QHS PRN Pucilowska, Jolanta B, MD        Lab Results:  Results for orders placed or performed during the hospital encounter of 09/03/17 (from the past 48 hour(s))  Urine Drug Screen, Qualitative     Status: None   Collection Time: 09/03/17  6:19 PM  Result Value Ref Range   Tricyclic, Ur Screen NONE DETECTED NONE DETECTED   Amphetamines, Ur Screen NONE DETECTED NONE DETECTED   MDMA (Ecstasy)Ur Screen NONE DETECTED NONE DETECTED   Cocaine Metabolite,Ur [place] NONE DETECTED NONE DETECTED   Opiate, Ur Screen NONE DETECTED NONE DETECTED   Phencyclidine (PCP) Ur S NONE DETECTED NONE DETECTED   Cannabinoid 50 Ng, Ur [place] NONE DETECTED NONE DETECTED   Barbiturates, Ur Screen NONE DETECTED NONE DETECTED   Benzodiazepine, Ur Scrn NONE DETECTED NONE DETECTED   Methadone Scn, Ur NONE DETECTED NONE DETECTED    Comment: (NOTE) Tricyclics + metabolites, urine    Cutoff 1000 ng/mL Amphetamines + metabolites, urine  Cutoff 1000 ng/mL MDMA (Ecstasy), urine              Cutoff 500 ng/mL Cocaine Metabolite, urine          Cutoff 300 ng/mL Opiate + metabolites, urine        Cutoff 300 ng/mL Phencyclidine (PCP), urine         Cutoff 25 ng/mL Cannabinoid, urine                 Cutoff 50 ng/mL Barbiturates + metabolites, urine  Cutoff 200 ng/mL Benzodiazepine, urine              Cutoff 200 ng/mL Methadone, urine                   Cutoff 300 ng/mL The urine drug screen provides only a preliminary, unconfirmed analytical test result and should not be used for non-medical purposes. Clinical consideration and professional judgment should be applied to any positive drug screen result due to possible interfering substances. A more  specific alternate chemical method must be used in order to obtain a confirmed analytical result. Gas chromatography / mass spectrometry (GC/MS) is the preferred confirmat ory method. Performed at [facility], [address] [place], [place] [address]     Blood Alcohol level:  Lab Results  Component Value Date   [facility] <10 08/31/2017   ETH <10 50/53/9767    Metabolic Disorder Labs: Lab Results  Component Value Date   HGBA1C 5.4 08/17/2017   MPG 108.28 08/17/2017   MPG 105.41 01/12/2017   No results found for: PROLACTIN Lab Results  Component Value Date   CHOL 131 08/17/2017   TRIG 111 08/17/2017   HDL 46 08/17/2017   CHOLHDL 2.8 08/17/2017   VLDL 22 08/17/2017   LDLCALC 63 08/17/2017   LDLCALC 88 01/12/2017    Physical Findings: AIMS:  , ,  ,  ,    CIWA:    COWS:     Musculoskeletal: Strength & Muscle Tone: within normal limits Gait & Station: normal Patient leans: N/A  Psychiatric Specialty Exam: Physical Exam  Nursing note and vitals reviewed.   Review of Systems  All other systems reviewed and are negative.   Blood pressure (!) 122/98, pulse (!) 167, temperature 97.7 F (36.5 C), temperature source Oral, resp. rate 20, height 6' (1.829 m), weight 72.6 kg (160 lb 0.9 oz), SpO2 100 %.Body mass index is 21.71 kg/m.  General Appearance: Casual  Eye Contact:  Fair  Speech:  Garbled  Volume:  Decreased  Mood:  Euthymic  Affect:  Constricted  Thought Process:  Disorganized  Orientation:  Full (Time, Place, and Person)  Thought Content:  Illogical  Suicidal Thoughts:  No  Homicidal Thoughts:  No  Memory:  Immediate;   Poor  Judgement:  Impaired  Insight:  Lacking  Psychomotor Activity:  Normal  Concentration:  Concentration: Poor  Recall:  Poor  Fund of Knowledge:  Poor  Language:  Fair  Akathisia:  No      Assets:  Resilience  ADL's:  Intact  Cognition:  Impaired,  Mild  Sleep:        Treatment Plan Summary: 62 yo male admitted  due to agitation and psychosis. He was stabile on [place] last admission however decompensated rather rapidly after discharge. He has had multiple trials of single antipsychotics but has remained psychotic and agitated. He would benefit from 2 anti-psychotics to help better treat the lingering and residual psychosis. He is not usually complaint with oral medications so will require him to be on 2 LAI medications. He was on Prolixin in the past however, he has not complied with every 2 week injections. I spoke with his outpatient psychiatrist, Dr. Doyne Keel. She recommends Invega and Haldol Decanoate injections.   Schizoaffective disorder -Pt is on [place]. Next dose due on 4/23 -Will d/c Zyprexa as he will require LAI -Start oral Haldol 5 mg daily and monitor for tolerability. Will plan to transition to Haldol decanoate -EKG QTc 399  Dispo -He follows with [name]. I spoke with his outpatient psychiatrist there   Marylin Crosby, MD 09/04/2017, 1:58 PM

## 2017-09-04 NOTE — [facility] Group Notes (Signed)
09/04/2017 1PM  Type of Therapy/Topic:  Group Therapy:  Feelings about Diagnosis  Participation Level:  Active   Description of Group:   This group will allow patients to explore their thoughts and feelings about diagnoses they have received. Patients will be guided to explore their level of understanding and acceptance of these diagnoses. Facilitator will encourage patients to process their thoughts and feelings about the reactions of others to their diagnosis and will guide patients in identifying ways to discuss their diagnosis with significant others in their lives. This group will be process-oriented, with patients participating in exploration of their own experiences, giving and receiving support, and processing challenge from other group members.   Therapeutic Goals: 1. Patient will demonstrate understanding of diagnosis as evidenced by identifying two or more symptoms of the disorder 2. Patient will be able to express two feelings regarding the diagnosis 3. Patient will demonstrate their ability to communicate their needs through discussion and/or role play  Summary of Patient Progress: Actively and appropriately engaged in the group. Evelena Peat says "When I leave from here I need to communicate better with my ACTT team and take my medicine like I'm suppose to." Patient was able to provide support and validation to other group members.Patient practiced active listening when interacting with the facilitator and other group members Patient in still in the process of obtaining treatment goals.        Therapeutic Modalities:   Cognitive Behavioral Therapy Brief Therapy Feelings Identification    Darin Engels, LCSW 09/04/2017 1:59 PM

## 2017-09-04 NOTE — B[facility] Group Notes (Signed)
  09/04/2017  Time: 0900  Type of Therapy and Topic: Group Therapy: Goals Group: SMART Goals   Participation Level:  Active   Description of Group:   The purpose of a daily goals group is to assist and guide patients in setting recovery/wellness-related goals. The objective is to set goals as they relate to the crisis in which they were admitted. Patients will be using SMART goal modalities to set measurable goals. Characteristics of realistic goals will be discussed and patients will be assisted in setting and processing how one will reach their goal. Facilitator will also assist patients in applying interventions and coping skills learned in psycho-education groups to the SMART goal and process how one will achieve defined goal.   Therapeutic Goals:  -Patients will develop and document one goal related to or their crisis in which brought them into treatment.  -Patients will be guided by LCSW using SMART goal setting modality in how to set a measurable, attainable, realistic and time sensitive goal.  -Patients will process barriers in reaching goal.  -Patients will process interventions in how to overcome and successful in reaching goal.   Patient's Goal:  Pt continues to work towards their tx goals but has not yet reached them. Pt was able to appropriately participate in group discussion, and was able to offer support/validation to other group members. Pt reported his goal for the end of the day is to, "go through the steps of this program."   Therapeutic Modalities:  Motivational Interviewing  Cognitive Behavioral Therapy  Crisis Intervention Model  SMART goals setting  Alden Hipp, MSW, LCSW Clinical Social Worker 09/04/2017 9:45 AM

## 2017-09-04 NOTE — Progress Notes (Signed)
Pt does not have capacity to sign consents at this time. He is very disorganized and floridly psychotic. It is essential to contact and coordinate care with his ACT team and family for discharge planning.

## 2017-09-04 NOTE — Plan of Care (Signed)
  Problem: Education: Goal: Knowledge of [facility] Education information/materials will improve Outcome: Progressing Goal: Emotional status will improve Outcome: Progressing Goal: Mental status will improve Outcome: Progressing   Problem: Activity: Goal: Interest or engagement in activities will improve Outcome: Progressing   

## 2017-09-04 NOTE — Plan of Care (Signed)
Patient slept for Estimated Hours of 5.30; Precautionary checks every 15 minutes for safety maintained, room free of safety hazards, patient sustains no injury or falls during this shift.  Problem: Education: Goal: Knowledge of [facility] Education information/materials will improve Outcome: Progressing Goal: Emotional status will improve Outcome: Progressing Goal: Mental status will improve Outcome: Progressing   Problem: Activity: Goal: Interest or engagement in activities will improve Outcome: Progressing   Problem: Coping: Goal: Ability to identify and develop effective coping behavior will improve Outcome: Progressing   Problem: Self-Concept: Goal: Will verbalize positive feelings about self Outcome: Progressing

## 2017-09-04 NOTE — Progress Notes (Signed)
Recreation Therapy Notes  INPATIENT RECREATION THERAPY ASSESSMENT  Patient Details Name: Dustin [name] MRN: [id number] DOB: 1956/02/15 Today's Date: 09/04/2017       Information Obtained From:    Able to Participate in Assessment/Interview: Yes  Patient Presentation: Withdrawn  Reason for Admission (Per Patient): Other (Comments)(Patient states he does not known why he is here. He expressed he talked to a therapist and the law brought him to the hospital)  Patient Stressors: (None)  Coping Skills:   Prayer, Read, Write, Journal  Leisure Interests (2+):  Individual - Reading(Read Bible)  Frequency of Recreation/Participation: Weekly  Awareness of Community Resources:  Yes  Community Resources:     Current Use: No  If no, Barriers?:    Expressed Interest in [place]: No  County of Residence:  [place]  Patient Main Form of Transportation: Musician  Patient Strengths:  None  Patient Identified Areas of Improvement:  Nothing  Patient Goal for Hospitalization:  I have no goals  Current SI (including self-harm):  No  Current HI:  No  Current AVH: No  Staff Intervention Plan: Group Attendance, Collaborate with Interdisciplinary Treatment Team  Consent to Intern Participation: N/A  Dustin [name] 09/04/2017, 3:34 PM

## 2017-09-05 MED ORDER — TRAZODONE HCL 50 MG PO TABS
50.0000 mg | ORAL_TABLET | Freq: Every evening | ORAL | Status: DC | PRN
  Administered 2017-09-05 – 2017-09-18 (×6): 50 mg via ORAL
  Filled 2017-09-05 (×6): qty 1

## 2017-09-05 NOTE — Plan of Care (Addendum)
Patient found in day room upon my arrival. Patient is visible and more social this evening. Speech is clearer, more easily understood but content is bizarre. Patient states, "I hear with my nose and mouth. Not like other people who hear with their ears." Patient showed me his teeth, two in particular, and stated that those teeth are the reason he is not "like other people." Patient mood and affect is happy. Denies all complaints. Denies SI/HI/AVH. Reports eating and voiding adequately. Requests pictures of an anatomical brain/skull because he wants to "study how different I am compared to the other ones." Compliant with HS medications and staff direction. Given Trazodone for sleep with positive results. Q 15 minute checks maintained. Will continue to monitor throughout the shift. Patient slept 7 hours. No apparent distress. Will endorse care to oncoming shift.  Problem: Education: Goal: Emotional status will improve Outcome: Progressing   Problem: Activity: Goal: Interest or engagement in activities will improve Outcome: Progressing   Problem: Self-Concept: Goal: Will verbalize positive feelings about self Outcome: Progressing   Problem: Coping: Goal: Level of anxiety will decrease Outcome: Progressing   Problem: Elimination: Goal: Will not experience complications related to bowel motility Outcome: Progressing

## 2017-09-05 NOTE — Plan of Care (Addendum)
Patient found in day room upon my arrival. Patient is visible but difficult to understand so peers are not engaging with him. Patient smiles and appears calm upon approach. Affect is happy. Speech is garbled, soft, and rapid. Assessment is difficult due to writer's inability to understand most of what patient says. Denies all complaints. Reports eating and voiding adequately. Compliant with HS medications and staff direction. Given Trazodone for sleep with positive results. Q 15 minute checks maintained. Will continue to monitor throughout the shift.  Patient slept 7.5 hours. No apparent distress. Will endorse care to oncoming shift.  Problem: Education: Goal: Emotional status will improve Outcome: Progressing Goal: Mental status will improve Outcome: Progressing   Problem: Coping: Goal: Level of anxiety will decrease Outcome: Progressing   Problem: Elimination: Goal: Will not experience complications related to bowel motility Outcome: Progressing

## 2017-09-05 NOTE — Plan of Care (Signed)
Patient is alert to self and place. Patient denies SI, HI and AVH. Patient continues to have disorganized thinking with flight of ideas. Patient's speech is slurred and hard to understand. Patient attends groups and is cooperative and compliant with medications.  Problem: Education: Goal: Emotional status will improve Outcome: Not Progressing   Problem: Coping: Goal: Ability to identify and develop effective coping behavior will improve Outcome: Progressing   Problem: Coping: Goal: Level of anxiety will decrease Outcome: Not Progressing  Nurse will continue to monitor Patient. Safety checks will continue Q 15 minutes.

## 2017-09-05 NOTE — Tx Team (Addendum)
Interdisciplinary Treatment and Diagnostic Plan Update  09/05/2017 Time of Session: 11AM Dustin [name] MRN: [id number]  Principal Diagnosis: Paranoid schizophrenia [facility])  Secondary Diagnoses: Principal Problem:   Paranoid schizophrenia ([place])   Current Medications:  Current Facility-Administered Medications  Medication Dose Route Frequency Provider Last Rate Last Dose  . acetaminophen (TYLENOL) tablet 650 mg  650 mg Oral Q6H PRN Pucilowska, Jolanta B, MD      . alum & mag hydroxide-simeth (MAALOX/MYLANTA) 200-200-20 MG/5ML suspension 30 mL  30 mL Oral Q4H PRN Pucilowska, Jolanta B, MD      . divalproex (DEPAKOTE) DR tablet 500 mg  500 mg Oral BH-q7a [name], [name], MD   500 mg at 09/05/17 0919   And  . divalproex (DEPAKOTE) DR tablet 1,000 mg  1,000 mg Oral QHS [name], [name], MD   1,000 mg at 09/04/17 2059  . haloperidol (HALDOL) tablet 5 mg  5 mg Oral QHS [name], [name], MD   5 mg at 09/04/17 2100  . hydrOXYzine (ATARAX/VISTARIL) tablet 25 mg  25 mg Oral TID PRN Pucilowska, Jolanta B, MD      . magnesium hydroxide (MILK OF MAGNESIA) suspension 30 mL  30 mL Oral Daily PRN Pucilowska, Jolanta B, MD      . multivitamin with minerals tablet 1 tablet  1 tablet Oral Daily Pucilowska, Jolanta B, MD   1 tablet at 09/05/17 0918  . nicotine (NICODERM CQ - dosed in mg/24 hours) patch 21 mg  21 mg Transdermal Daily Pucilowska, Jolanta B, MD      . Derrill Memo ON 09/28/2017] paliperidone (INVEGA SUSTENNA) injection 234 mg  234 mg Intramuscular Q28 days Pucilowska, Jolanta B, MD      . traZODone (DESYREL) tablet 100 mg  100 mg Oral QHS PRN Pucilowska, Jolanta B, MD   100 mg at 09/04/17 2100   PTA Medications: Medications Prior to Admission  Medication Sig Dispense Refill Last Dose  . divalproex (DEPAKOTE) 500 MG DR tablet Take 1 tablet (500 mg total) by mouth every 12 (twelve) hours. (Patient not taking: Reported on 09/01/2017) 60 tablet 0 Not Taking at Unknown time  . Multiple Vitamin (MULTIVITAMIN) tablet  Take 1 tablet by mouth daily.   Past Week at Unknown time  . Omega-3 Fatty Acids (OMEGA-3 FISH OIL PO) Take 1 capsule by mouth daily.   Past Week at Unknown time  . [START ON 09/25/2017] paliperidone (INVEGA SUSTENNA) 234 MG/1.5ML SUSP injection Inject 234 mg into the muscle once for 1 dose. (Patient not taking: Reported on 09/01/2017) 1.5 mL 0 Not Taking at Unknown time    Patient Stressors: Financial difficulties Marital or family conflict Medication change or noncompliance  Patient Strengths: Active sense of humor Communication skills Supportive family/friends  Treatment Modalities: Medication Management, Group therapy, Case management,  1 to 1 session with clinician, Psychoeducation, Recreational therapy.   Physician Treatment Plan for Primary Diagnosis: Paranoid schizophrenia ([place]) Long Term Goal(s): Improvement in symptoms so as ready for discharge   Short Term Goals: Ability to verbalize feelings will improve  Medication Management: Evaluate patient's response, side effects, and tolerance of medication regimen.  Therapeutic Interventions: 1 to 1 sessions, Unit Group sessions and Medication administration.  Evaluation of Outcomes: Progressing  Physician Treatment Plan for Secondary Diagnosis: Principal Problem:   Paranoid schizophrenia ([place])  Long Term Goal(s): Improvement in symptoms so as ready for discharge   Short Term Goals: Ability to verbalize feelings will improve     Medication Management: Evaluate patient's response, side effects,  and tolerance of medication regimen.  Therapeutic Interventions: 1 to 1 sessions, Unit Group sessions and Medication administration.  Evaluation of Outcomes: Progressing   RN Treatment Plan for Primary Diagnosis: Paranoid schizophrenia ([place]) Long Term Goal(s): Knowledge of disease and therapeutic regimen to maintain health will improve  Short Term Goals: Ability to verbalize frustration and anger appropriately will improve, Ability  to demonstrate self-control, Ability to participate in decision making will improve, Ability to identify and develop effective coping behaviors will improve and Compliance with prescribed medications will improve  Medication Management: RN will administer medications as ordered by provider, will assess and evaluate patient's response and provide education to patient for prescribed medication. RN will report any adverse and/or side effects to prescribing provider.  Therapeutic Interventions: 1 on 1 counseling sessions, Psychoeducation, Medication administration, Evaluate responses to treatment, Monitor vital signs and CBGs as ordered, Perform/monitor CIWA, COWS, AIMS and Fall Risk screenings as ordered, Perform wound care treatments as ordered.  Evaluation of Outcomes: Progressing   LCSW Treatment Plan for Primary Diagnosis: Paranoid schizophrenia ([place]) Long Term Goal(s): Safe transition to appropriate next level of care at discharge, Engage patient in therapeutic group addressing interpersonal concerns.  Short Term Goals: Engage patient in aftercare planning with referrals and resources, Increase social support, Facilitate acceptance of mental health diagnosis and concerns, Identify triggers associated with mental health/substance abuse issues and Increase skills for wellness and recovery  Therapeutic Interventions: Assess for all discharge needs, 1 to 1 time with Social worker, Explore available resources and support systems, Assess for adequacy in community support network, Educate family and significant other(s) on suicide prevention, Complete Psychosocial Assessment, Interpersonal group therapy.  Evaluation of Outcomes: Progressing   Progress in Treatment: Attending groups: Yes. Participating in groups: Yes. Taking medication as prescribed: Yes. Toleration medication: Yes. Family/Significant other contact made: Yes, individual(s) contacted:  Dustin [name] Patient understands diagnosis:  Yes. Discussing patient identified problems/goals with staff: Yes. Medical problems stabilized or resolved: Yes. Denies suicidal/homicidal ideation: Yes. Issues/concerns per patient self-inventory: No. Other:   New problem(s) identified: No, Describe:  None  New Short Term/Long Term Goal(s): N/A  Discharge Plan or Barriers: To return home and continue to follow up with ACTT team.   Reason for Continuation of Hospitalization: Delusions  Depression Medication stabilization  Estimated Length of Stay: 7 days  Recreational Therapy: Patient Stressors: N/A Patient Goal: Patient will engage in interactions with peers and staff in pro-social manner at least 2x within 5 recreation therapy group sessions  Attendees: Patient: 09/05/2017 11:19 AM  Physician: Amador Cunas 09/05/2017 11:19 AM  Nursing:  09/05/2017 11:19 AM  RN Care Manager: 09/05/2017 11:19 AM  Social Worker: Darin Engels, [place] 09/05/2017 11:19 AM  Recreational Therapist: Isaias Sakai. Marcello Fennel, LRT 09/05/2017 11:19 AM  Other: Alden Hipp, LCSW 09/05/2017 11:19 AM  Other: Derrek Gu, LCSW 09/05/2017 11:19 AM  Other: 09/05/2017 11:19 AM    Scribe for Treatment Team: Darin Engels, LCSW 09/05/2017 11:19 AM

## 2017-09-05 NOTE — BHH Suicide Risk Assessment (Signed)
Dustin [name] INPATIENT:  Family/Significant Other Suicide Prevention Education  Suicide Prevention Education:  Education Completed; Day Deery, patients brother, [phone] has been identified by the patient as the family member/significant other with whom the patient will be residing, and identified as the person(s) who will aid the patient in the event of a mental health crisis (suicidal ideations/suicide attempt).  With written consent from the patient, the family member/significant other has been provided the following suicide prevention education, prior to the and/or following the discharge of the patient.  The suicide prevention education provided includes the following:  Suicide risk factors  Suicide prevention and interventions  National Suicide Hotline telephone number  [facility] assessment telephone number  [facility] Emergency Assistance [place] and/or Residential Mobile Crisis Unit telephone number  Request made of family/significant other to:  Remove weapons (e.g., guns, rifles, knives), all items previously/currently identified as safety concern.    Remove drugs/medications (over-the-counter, prescriptions, illicit drugs), all items previously/currently identified as a safety concern.  The family member/significant other verbalizes understanding of the suicide prevention education information provided.  The family member/significant other agrees to remove the items of safety concern listed above.  Patients brother states that the patients behavior has changed over the last 6 months and inquired about any medication changes that he has had. He report that the ACTT team was the persons who got him admitted to the hospital. He hopes that we can get him on the right medications to stable him out. He doesn't report any guns/weapons in the home.         Darin Engels 09/05/2017, 10:02 AM

## 2017-09-05 NOTE — Progress Notes (Signed)
[facility] MD Progress Note  09/05/2017 11:10 AM ZAIDAN KEEBLE  MRN:  [id number] Subjective:  Pt states that he did not sleep well last night and is trying to catch up with rest today. He does mumble incoherently at times but overall more organized than yesterday. Per CSW, he did well in group yesterday. He denies SI. He states that his mood feels "calm." Denies feeling agitated or depressed. He has not had any episodes of agitation or outbursts on the unit.   Principal Problem: Paranoid schizophrenia ([place]) Diagnosis:   Patient Active Problem List   Diagnosis Date Noted  . Paranoid schizophrenia ([place]) [F20.0] 09/03/2017    Priority: High  . Schizoaffective disorder, bipolar type ([place]) [F25.0] 01/24/2017    Priority: High  . Tardive dyskinesia [G24.01] 01/24/2017  . Tobacco use disorder [F17.200] 11/29/2016   Total Time spent with patient: 15 minutes  Past Psychiatric History: See H&p  Past Medical History:  Past Medical History:  Diagnosis Date  . Schizophrenia ([place])    History reviewed. No pertinent surgical history. Family History: History reviewed. No pertinent family history. Family Psychiatric  History: See H&P Social History:  Social History   Substance and Sexual Activity  Alcohol Use Yes  . Alcohol/week: 4.2 oz  . Types: 7 Shots of liquor per week   Comment: 1 drink every night- gin     Social History   Substance and Sexual Activity  Drug Use No    Social History   Socioeconomic History  . Marital status: Single    Spouse name: Not on file  . Number of children: Not on file  . Years of education: Not on file  . Highest education level: Not on file  Occupational History  . Not on file  Social Needs  . Financial resource strain: Not on file  . Food insecurity:    Worry: Not on file    Inability: Not on file  . Transportation needs:    Medical: Not on file    Non-medical: Not on file  Tobacco Use  . Smoking status: Current Every Day Smoker    Packs/day: 0.50     Years: 15.00    Pack years: 7.50  . Smokeless tobacco: Never Used  Substance and Sexual Activity  . Alcohol use: Yes    Alcohol/week: 4.2 oz    Types: 7 Shots of liquor per week    Comment: 1 drink every night- gin  . Drug use: No  . Sexual activity: Never    Birth control/protection: None  Lifestyle  . Physical activity:    Days per week: Not on file    Minutes per session: Not on file  . Stress: Not on file  Relationships  . Social connections:    Talks on phone: Not on file    Gets together: Not on file    Attends religious service: Not on file    Active member of club or organization: Not on file    Attends meetings of clubs or organizations: Not on file    Relationship status: Not on file  Other Topics Concern  . Not on file  Social History Narrative  . Not on file   Additional Social History:                         Sleep: Poor  Appetite:  Fair  Current Medications: Current Facility-Administered Medications  Medication Dose Route Frequency Provider Last Rate Last Dose  . acetaminophen (  TYLENOL) tablet 650 mg  650 mg Oral Q6H PRN Pucilowska, Jolanta B, MD      . alum & mag hydroxide-simeth (MAALOX/MYLANTA) 200-200-20 MG/5ML suspension 30 mL  30 mL Oral Q4H PRN Pucilowska, Jolanta B, MD      . divalproex (DEPAKOTE) DR tablet 500 mg  500 mg Oral BH-q7a Briceyda Abdullah, [name], MD   500 mg at 09/05/17 0919   And  . divalproex (DEPAKOTE) DR tablet 1,000 mg  1,000 mg Oral QHS Sylver Vantassell R, MD   1,000 mg at 09/04/17 2059  . haloperidol (HALDOL) tablet 5 mg  5 mg Oral QHS Zahki Hoogendoorn, [name], MD   5 mg at 09/04/17 2100  . hydrOXYzine (ATARAX/VISTARIL) tablet 25 mg  25 mg Oral TID PRN Pucilowska, Jolanta B, MD      . magnesium hydroxide (MILK OF MAGNESIA) suspension 30 mL  30 mL Oral Daily PRN Pucilowska, Jolanta B, MD      . multivitamin with minerals tablet 1 tablet  1 tablet Oral Daily Pucilowska, Jolanta B, MD   1 tablet at 09/05/17 0918  . nicotine (NICODERM CQ -  dosed in mg/24 hours) patch 21 mg  21 mg Transdermal Daily Pucilowska, Jolanta B, MD      . Derrill Memo ON 09/28/2017] paliperidone (INVEGA SUSTENNA) injection 234 mg  234 mg Intramuscular Q28 days Pucilowska, Jolanta B, MD      . traZODone (DESYREL) tablet 100 mg  100 mg Oral QHS PRN Pucilowska, Jolanta B, MD   100 mg at 09/04/17 2100    Lab Results:  Results for orders placed or performed during the hospital encounter of 09/03/17 (from the past 48 hour(s))  Urine Drug Screen, Qualitative     Status: None   Collection Time: 09/03/17  6:19 PM  Result Value Ref Range   Tricyclic, Ur Screen NONE DETECTED NONE DETECTED   Amphetamines, Ur Screen NONE DETECTED NONE DETECTED   MDMA (Ecstasy)Ur Screen NONE DETECTED NONE DETECTED   Cocaine Metabolite,Ur [place] NONE DETECTED NONE DETECTED   Opiate, Ur Screen NONE DETECTED NONE DETECTED   Phencyclidine (PCP) Ur S NONE DETECTED NONE DETECTED   Cannabinoid 50 Ng, Ur [place] NONE DETECTED NONE DETECTED   Barbiturates, Ur Screen NONE DETECTED NONE DETECTED   Benzodiazepine, Ur Scrn NONE DETECTED NONE DETECTED   Methadone Scn, Ur NONE DETECTED NONE DETECTED    Comment: (NOTE) Tricyclics + metabolites, urine    Cutoff 1000 ng/mL Amphetamines + metabolites, urine  Cutoff 1000 ng/mL MDMA (Ecstasy), urine              Cutoff 500 ng/mL Cocaine Metabolite, urine          Cutoff 300 ng/mL Opiate + metabolites, urine        Cutoff 300 ng/mL Phencyclidine (PCP), urine         Cutoff 25 ng/mL Cannabinoid, urine                 Cutoff 50 ng/mL Barbiturates + metabolites, urine  Cutoff 200 ng/mL Benzodiazepine, urine              Cutoff 200 ng/mL Methadone, urine                   Cutoff 300 ng/mL The urine drug screen provides only a preliminary, unconfirmed analytical test result and should not be used for non-medical purposes. Clinical consideration and professional judgment should be applied to any positive drug screen result due to possible interfering substances. A  more  specific alternate chemical method must be used in order to obtain a confirmed analytical result. Gas chromatography / mass spectrometry (GC/MS) is the preferred confirmat ory method. Performed at [facility], [address] [place], [place] [address]     Blood Alcohol level:  Lab Results  Component Value Date   [facility] <10 08/31/2017   ETH <10 29/79/8921    Metabolic Disorder Labs: Lab Results  Component Value Date   HGBA1C 5.4 08/17/2017   MPG 108.28 08/17/2017   MPG 105.41 01/12/2017   No results found for: PROLACTIN Lab Results  Component Value Date   CHOL 131 08/17/2017   TRIG 111 08/17/2017   HDL 46 08/17/2017   CHOLHDL 2.8 08/17/2017   VLDL 22 08/17/2017   LDLCALC 63 08/17/2017   LDLCALC 88 01/12/2017    Physical Findings: AIMS:  , ,  ,  ,    CIWA:    COWS:     Musculoskeletal: Strength & Muscle Tone: within normal limits Gait & Station: normal Patient leans: N/A  Psychiatric Specialty Exam: Physical Exam  ROS  Blood pressure (!) 116/97, pulse 95, temperature (!) 97.4 F (36.3 C), temperature source Oral, resp. rate 18, height 6' (1.829 m), weight 72.6 kg (160 lb 0.9 oz), SpO2 100 %.Body mass index is 21.71 kg/m.  General Appearance: Casual  Eye Contact:  Fair  Speech:  Garbled  Volume:  Decreased  Mood:  Euthymic  Affect:  Appropriate  Thought Process:  Disorganized  Orientation:  Full (Time, Place, and Person)  Thought Content:  Illogical  Suicidal Thoughts:  No  Homicidal Thoughts:  No  Memory:  Immediate;   Fair  Judgement:  Impaired  Insight:  Lacking  Psychomotor Activity:  Normal  Concentration:  Concentration: Poor  Recall:  [employer] of Knowledge:  Fair  Language:  Fair  Akathisia:  No      Assets:  Resilience  ADL's:  Intact  Cognition:  Impaired,  Mild  Sleep:  Number of Hours: 7.5     Treatment Plan Summary: 62 yo male admitted due to agitation and psychosis. He has been calm on the unit. He is still  disorganized and incoherent at times but slightly better than yesterday. Did not have any delusional thought content today. He was started on oral Haldol yesterday with plan to transition to Haldol decanoate. Benefits greatly outweigh risks of being on 2 anti-psychotics.   Plan:  Schizoaffective disorder -He is on [place]. Next dose due 4/23 -Continue oral Haldol 5 mg daily. If he tolerates, will increase oral dose tomorrow or Friday. Will plan to transition to [place]. Will try to have injections close together so he can get the injections at the same time each month. There has been difficulty with getting him to get his injections every 2 weeks (when he was on Prolixin) -BP looks okay. Will monitor  Dispo -He follows with [name]. I am in contact with his outpatient psychiatrist there. He adamantly refuses to go to group home.   Marylin Crosby, MD 09/05/2017, 11:10 AM

## 2017-09-05 NOTE — Progress Notes (Signed)
Recreation Therapy Notes   Date: 09/05/2017  Time: 9:30 am  Location: Craft Room  Behavioral response: N/A  Intervention Topic: Coping Skills  Discussion/Intervention: Patient did not attend group.   Clinical Observations/Feedback:  Patient did not attend group. Arthuro Canelo LRT/CTRS            Mansfield Dann 09/05/2017 12:37 PM

## 2017-09-05 NOTE — B[facility] Group Notes (Signed)
LCSW Group Therapy Note  09/05/2017 1:00 pm  Type of Therapy/Topic:  Group Therapy:  Emotion Regulation  Participation Level:  None   Description of Group:    The purpose of this group is to assist patients in learning to regulate negative emotions and experience positive emotions. Patients will be guided to discuss ways in which they have been vulnerable to their negative emotions. These vulnerabilities will be juxtaposed with experiences of positive emotions or situations, and patients will be challenged to use positive emotions to combat negative ones. Special emphasis will be placed on coping with negative emotions in conflict situations, and patients will process healthy conflict resolution skills.  Therapeutic Goals: 1. Patient will identify two positive emotions or experiences to reflect on in order to balance out negative emotions 2. Patient will label two or more emotions that they find the most difficult to experience 3. Patient will demonstrate positive conflict resolution skills through discussion and/or role plays  Summary of Patient Progress:  Dustin [name] only staying in group for about 10 minutes and did not chose to actively engage in the discussions.     Therapeutic Modalities:   Cognitive Behavioral Therapy Feelings Identification Dialectical Behavioral Therapy

## 2017-09-06 LAB — URINALYSIS, COMPLETE (UACMP) WITH MICROSCOPIC
BACTERIA UA: NONE SEEN
BILIRUBIN URINE: NEGATIVE
Glucose, UA: NEGATIVE mg/dL
HGB URINE DIPSTICK: NEGATIVE
Ketones, ur: 5 mg/dL — AB
Leukocytes, UA: NEGATIVE
NITRITE: NEGATIVE
PROTEIN: NEGATIVE mg/dL
SQUAMOUS EPITHELIAL / LPF: NONE SEEN
Specific Gravity, Urine: 1.018 (ref 1.005–1.030)
pH: 5 (ref 5.0–8.0)

## 2017-09-06 MED ORDER — HALOPERIDOL 5 MG PO TABS
5.0000 mg | ORAL_TABLET | Freq: Two times a day (BID) | ORAL | Status: DC
  Administered 2017-09-06 – 2017-09-19 (×26): 5 mg via ORAL
  Filled 2017-09-06 (×27): qty 1

## 2017-09-06 MED ORDER — PALIPERIDONE PALMITATE 234 MG/1.5ML IM SUSP: 234.0000 mg | INTRAMUSCULAR | Status: DC

## 2017-09-06 NOTE — Plan of Care (Addendum)
Patient found in day room upon my arrival. Patient is visible and somewhat social this evening. Peers have difficulty engaging with patient due to garbled, rapid, and bizarre speech. Patient affect is bright. Denies depression and anxiety. Denies SI/HI/AVH. Continues to believe he hears through his nose and mouth. Patient is verbose but difficult to understand. Seems gratified when someone listens to him despite being unable to understand. Continues to be religiously preoccupied but not overtly. Calm and pleasant throughout interaction. Reports eating and voiding adequately. Denies pain. Patient had slight vertigo in the morning and told me he "feels better" this afternoon/evening. Compliant with HS medications and staff direction. Q 15 minute checks maintained. Will continue to monitor throughout the shift. Patient slept 4.25 hours. No apparent distress. Will endorse care to oncoming shift.  Problem: Education: Goal: Emotional status will improve Outcome: Progressing Goal: Mental status will improve Outcome: Progressing   Problem: Activity: Goal: Interest or engagement in activities will improve Outcome: Progressing   Problem: Coping: Goal: Level of anxiety will decrease Outcome: Progressing   Problem: Elimination: Goal: Will not experience complications related to bowel motility Outcome: Progressing

## 2017-09-06 NOTE — Progress Notes (Addendum)
[facility] MD Progress Note  09/06/2017 12:48 PM Dustin [name]  MRN:  [id number]   Subjective:  Pt is still very disorganized in conversation. HE states that he has an Army Pen in his teeth to help him hear and to "help with my senses." HE is able to answer some questions rationally but quickly derails with thought process. He is incoherent with his speech at times. He is able to state that he remembers someone said he made a scene at the "mental health facility and the ACT team became involved." He states that he feels calm in the hospital. He has not been agitated or angry on the unit. His hygiene is good.   Principal Problem: Paranoid schizophrenia ([place]) Diagnosis:   Patient Active Problem List   Diagnosis Date Noted  . Paranoid schizophrenia ([place]) [F20.0] 09/03/2017    Priority: High  . Schizoaffective disorder, bipolar type ([place]) [F25.0] 01/24/2017    Priority: High  . Tardive dyskinesia [G24.01] 01/24/2017  . Tobacco use disorder [F17.200] 11/29/2016   Total Time spent with patient: 20 minutes  Past Psychiatric History: See H&P  Past Medical History:  Past Medical History:  Diagnosis Date  . Schizophrenia ([place])    History reviewed. No pertinent surgical history. Family History: History reviewed. No pertinent family history. Family Psychiatric  History: See H&P Social History:  Social History   Substance and Sexual Activity  Alcohol Use Yes  . Alcohol/week: 4.2 oz  . Types: 7 Shots of liquor per week   Comment: 1 drink every night- gin     Social History   Substance and Sexual Activity  Drug Use No    Social History   Socioeconomic History  . Marital status: Single    Spouse name: Not on file  . Number of children: Not on file  . Years of education: Not on file  . Highest education level: Not on file  Occupational History  . Not on file  Social Needs  . Financial resource strain: Not on file  . Food insecurity:    Worry: Not on file    Inability: Not on file  .  Transportation needs:    Medical: Not on file    Non-medical: Not on file  Tobacco Use  . Smoking status: Current Every Day Smoker    Packs/day: 0.50    Years: 15.00    Pack years: 7.50  . Smokeless tobacco: Never Used  Substance and Sexual Activity  . Alcohol use: Yes    Alcohol/week: 4.2 oz    Types: 7 Shots of liquor per week    Comment: 1 drink every night- gin  . Drug use: No  . Sexual activity: Never    Birth control/protection: None  Lifestyle  . Physical activity:    Days per week: Not on file    Minutes per session: Not on file  . Stress: Not on file  Relationships  . Social connections:    Talks on phone: Not on file    Gets together: Not on file    Attends religious service: Not on file    Active member of club or organization: Not on file    Attends meetings of clubs or organizations: Not on file    Relationship status: Not on file  Other Topics Concern  . Not on file  Social History Narrative  . Not on file   Additional Social History:  Sleep: Fair  Appetite:  Good  Current Medications: Current Facility-Administered Medications  Medication Dose Route Frequency Provider Last Rate Last Dose  . acetaminophen (TYLENOL) tablet 650 mg  650 mg Oral Q6H PRN Pucilowska, Jolanta B, MD      . alum & mag hydroxide-simeth (MAALOX/MYLANTA) 200-200-20 MG/5ML suspension 30 mL  30 mL Oral Q4H PRN Pucilowska, Jolanta B, MD      . divalproex (DEPAKOTE) DR tablet 500 mg  500 mg Oral BH-q7a Kristia Jupiter, [name], MD   500 mg at 09/06/17 1607   And  . divalproex (DEPAKOTE) DR tablet 1,000 mg  1,000 mg Oral QHS Jezebel Pollet R, MD   1,000 mg at 09/05/17 2059  . haloperidol (HALDOL) tablet 5 mg  5 mg Oral BID Tesneem Dufrane, [name], MD      . hydrOXYzine (ATARAX/VISTARIL) tablet 25 mg  25 mg Oral TID PRN Pucilowska, Jolanta B, MD      . magnesium hydroxide (MILK OF MAGNESIA) suspension 30 mL  30 mL Oral Daily PRN Pucilowska, Jolanta B, MD      . multivitamin  with minerals tablet 1 tablet  1 tablet Oral Daily Pucilowska, Jolanta B, MD   1 tablet at 09/06/17 0803  . nicotine (NICODERM CQ - dosed in mg/24 hours) patch 21 mg  21 mg Transdermal Daily Pucilowska, Jolanta B, MD      . Derrill Memo ON 09/28/2017] paliperidone (INVEGA SUSTENNA) injection 234 mg  234 mg Intramuscular Q28 days Pucilowska, Jolanta B, MD      . traZODone (DESYREL) tablet 50 mg  50 mg Oral QHS PRN Domonic Hiscox, [name], MD   50 mg at 09/05/17 2059    Lab Results: No results found for this or any previous visit (from the past 48 hour(s)).  Blood Alcohol level:  Lab Results  Component Value Date   ETH <10 08/31/2017   ETH <10 37/03/6268    Metabolic Disorder Labs: Lab Results  Component Value Date   HGBA1C 5.4 08/17/2017   MPG 108.28 08/17/2017   MPG 105.41 01/12/2017   No results found for: PROLACTIN Lab Results  Component Value Date   CHOL 131 08/17/2017   TRIG 111 08/17/2017   HDL 46 08/17/2017   CHOLHDL 2.8 08/17/2017   VLDL 22 08/17/2017   LDLCALC 63 08/17/2017   LDLCALC 88 01/12/2017    Physical Findings: AIMS:  , ,  ,  ,    CIWA:    COWS:     Musculoskeletal: Strength & Muscle Tone: within normal limits Gait & Station: normal Patient leans: N/A  Psychiatric Specialty Exam: Physical Exam  Nursing note and vitals reviewed. No muscle stiffness or cogwheeling noted on exam.   Review of Systems  All other systems reviewed and are negative.   Blood pressure 120/78, pulse 100, temperature 98 F (36.7 C), temperature source Oral, resp. rate 18, height 6' (1.829 m), weight 72.6 kg (160 lb 0.9 oz), SpO2 99 %.Body mass index is 21.71 kg/m.  General Appearance: Casual  Eye Contact:  Good  Speech:  Slow  Volume:  Normal  Mood:  Euthymic  Affect:  Appropriate  Thought Process:  Disorganized and Irrelevant  Orientation:  Full (Time, Place, and Person)  Thought Content:  Logical and Delusions  Suicidal Thoughts:  No  Homicidal Thoughts:  No  Memory:  Immediate;    Poor  Judgement:  Impaired  Insight:  Lacking  Psychomotor Activity:  Normal  Concentration:  Concentration: Poor  Recall:  Poor  Fund of  Knowledge:  Fair  Language:  Poor  Akathisia:  No      Assets:  Resilience  ADL's:  Intact  Cognition:  Impaired,  Mild  Sleep:  Number of Hours: 7     Treatment Plan Summary: 62 yo male admitted due to psychosis. He continues to be very psychotic but has been very calm on the unit with no agitation at all. He is tolerating Haldol.   Plan:  Schizoaffective disorder -He is on [place]. Next dose due 4/23 -Increase Haldol to 5 mg BID. Will plan to transition to [place]. Will try to have injections close together so he can get the injections at the same time each month. There has been difficulty with getting him to get his injections every 2 weeks (when he was on Prolixin) -Continue Depakote 500 mg qam and 1000 mg qhs. Check level on Saturday -BP looks okay. Will monitor -QTc 399  UA still has not been collected  Dispo -He follows with [name]. I am in contact with his outpatient psychiatrist there. He adamantly refuses to go to group home.     Marylin Crosby, MD 09/06/2017, 12:48 PM

## 2017-09-06 NOTE — Plan of Care (Signed)
Patient continues to be alert to self and place. Patient is having magical thinking as well derealizations and is also focused on religion. Patient believes he hears with his teeth and he has a chip, blue tooth, in his lower teeth that allows him to speak directly to God. Patient has slurred rapid speech which can make it hard to understand at times. Today patient stated his brother was making him take experimental medicine against his will. Patient does attend group, and is eating meals well and is compliant with medications. Nurse will continue to monitor and 15 minute safety check up will continue. Problem: Education: Goal: Knowledge of [place] [facility] Education information/materials will improve Outcome: Not Progressing Goal: Emotional status will improve Outcome: Not Progressing Goal: Mental status will improve Outcome: Not Progressing   Problem: Activity: Goal: Interest or engagement in activities will improve Outcome: Progressing   Problem: Self-Concept: Goal: Will verbalize positive feelings about self Outcome: Progressing

## 2017-09-06 NOTE — B[facility] Group Notes (Signed)
09/06/2017  Time: 1PM  Type of Therapy/Topic:  Group Therapy:  Balance in Life  Participation Level:  Active  Description of Group:   This group will address the concept of balance and how it feels and looks when one is unbalanced. Patients will be encouraged to process areas in their lives that are out of balance and identify reasons for remaining unbalanced. Facilitators will guide patients in utilizing problem-solving interventions to address and correct the stressor making their life unbalanced. Understanding and applying boundaries will be explored and addressed for obtaining and maintaining a balanced life. Patients will be encouraged to explore ways to assertively make their unbalanced needs known to significant others in their lives, using other group members and facilitator for support and feedback.  Therapeutic Goals: 1. Patient will identify two or more emotions or situations they have that consume much of in their lives. 2. Patient will identify signs/triggers that life has become out of balance:  3. Patient will identify two ways to set boundaries in order to achieve balance in their lives:  4. Patient will demonstrate ability to communicate their needs through discussion and/or role plays  Summary of Patient Progress: Pt continues to work towards their tx goals but has not yet reached them. Pt was able to appropriately participate in group discussion, and was able to offer support/validation to other group members. Pt reported one area of his life that he devotes too much attention to is, "being here in the hospital." Pt reported one area of his life he would like to devote more attention to is, "his employment." Pt reported one way he can work towards a better balanced life is to, "get training."   Therapeutic Modalities:   Cognitive Behavioral Therapy Solution-Focused Therapy Assertiveness Training  Alden Hipp, MSW, LCSW Clinical Social Worker 09/06/2017 1:53 PM

## 2017-09-06 NOTE — Progress Notes (Signed)
   09/06/17 1400  Clinical Encounter Type  Visited With Patient  Visit Type Initial  Referral From Chaplain  Consult/Referral To [place] text;Emotional   On-chaplain informed this chaplain that [facility] had called requesting a visit with this patient.  Chaplain met with patient who spoke of holy books that had been given to him by his father that his sister now has and how he perceived that change.  Patient spoke of a dream he had in which he was a 'sacrifice;' patient also self-identified as being of the blood of Tammi Klippel.  Patient spoke of his mother's Cherokee spirituality and of his engagement with Spirit/God.  At times, chaplain found it difficult to track a consistent narrative in his story.  Patient requested a Bible and a diagram of the brain 'with cells and glands.'  Patient spoke of his being able to enter the minds of others and that he had briefly been in the chaplain's mind which he said he was 'sure that I knew.'  Patient also spoke of the connection between his ability to breath and his ability to hear.  When he can't breath, he can't hear.  At end of meeting, chaplain went to retrieve a Bible and located some images of the brain as patient had described.  When chaplain returned with items, patient expressed appreciation.

## 2017-09-06 NOTE — B[facility] Group Notes (Signed)
LCSW Group Therapy Note 09/06/2017 9:00 AM  Type of Therapy and Topic:  Group Therapy:  Setting Goals  Participation Level:  Minimal  Description of Group: In this process group, patients discussed using strengths to work toward goals and address challenges.  Patients identified two positive things about themselves and one goal they were working on.  Patients were given the opportunity to share openly and support each other's plan for self-empowerment.  The group discussed the value of gratitude and were encouraged to have a daily reflection of positive characteristics or circumstances.  Patients were encouraged to identify a plan to utilize their strengths to work on current challenges and goals.  Therapeutic Goals 1. Patient will verbalize personal strengths/positive qualities and relate how these can assist with achieving desired personal goals 2. Patients will verbalize affirmation of peers plans for personal change and goal setting 3. Patients will explore the value of gratitude and positive focus as related to successful achievement of goals 4. Patients will verbalize a plan for regular reinforcement of personal positive qualities and circumstances.  Summary of Patient Progress:  Dustin [name] attempted to participate in today's Setting Goals group, but presented very disoriented in his thought process. He did not seem to understand the topic for today or the concepts presented by CSW.  Dustin [name] consistently "rambled" on about needing to obtain "an Dustin [name]".  CSW attempted to re-direct Dustin [name] on several occasions, but was unsuccessful.     Therapeutic Modalities Cognitive Behavioral Therapy Motivational Interviewing    Devona Konig, [place] 09/06/2017 10:25 AM

## 2017-09-06 NOTE — Progress Notes (Signed)
Recreation Therapy Notes  Date: 09/06/2017  Time: 9:30 am  Location: Craft Room  Behavioral response: N/A  Intervention Topic: Team Work  Discussion/Intervention: Patient did not attend group.   Clinical Observations/Feedback:  Patient did not attend group.   Lonie Rummell LRT/CTRS          Mariona Scholes 09/06/2017 11:51 AM 

## 2017-09-06 NOTE — BHH Group Notes (Signed)
[place] Group Notes:  (Nursing/MHT/Case Management/Adjunct)  Date:  09/06/2017  Time:  9:38 PM  Type of Therapy:  Group Therapy  Participation Level:  Active  Participation Quality:  Appropriate  Affect:  Appropriate  Cognitive:  Appropriate  Insight:  Good  Engagement in Group:  Engaged  Modes of Intervention:  Support  Summary of Progress/Problems:  Dustin [name] 09/06/2017, 9:38 PM

## 2017-09-07 NOTE — Plan of Care (Signed)
Pt. Verbalizes understanding of provided education. Pt. Attends groups. Pt. Verbalizes he is doing, "good" this evening and states he can remain safe while on the unit. Verbally contracts for safety.    Problem: Education: Goal: Knowledge of [place] [facility] Education information/materials will improve Outcome: Progressing   Problem: Activity: Goal: Interest or engagement in activities will improve Outcome: Progressing   Problem: Self-Concept: Goal: Will verbalize positive feelings about self Outcome: Progressing

## 2017-09-07 NOTE — Progress Notes (Signed)
Recreation Therapy Notes  Date: 09/07/2017  Time: 9:30 am  Location: Craft Room  Behavioral response: Appropriate  Intervention Topic: Leisure  Discussion/Intervention: Patient did not attend group. Clinical Observations/Feedback:  Patient did not attend group. Shantara Goosby LRT/CTRS          Letizia Hook 09/07/2017 12:24 PM 

## 2017-09-07 NOTE — BHH Group Notes (Signed)
09/07/2017 1PM  Type of Therapy and Topic:  Group Therapy:  Feelings around Relapse and Recovery  Participation Level:  Active   Description of Group:    Patients in this group will discuss emotions they experience before and after a relapse. They will process how experiencing these feelings, or avoidance of experiencing them, relates to having a relapse. Facilitator will guide patients to explore emotions they have related to recovery. Patients will be encouraged to process which emotions are more powerful. They will be guided to discuss the emotional reaction significant others in their lives may have to patients' relapse or recovery. Patients will be assisted in exploring ways to respond to the emotions of others without this contributing to a relapse.  Therapeutic Goals: 1. Patient will identify two or more emotions that lead to a relapse for them 2. Patient will identify two emotions that result when they relapse 3. Patient will identify two emotions related to recovery 4. Patient will demonstrate ability to communicate their needs through discussion and/or role plays   Summary of Patient Progress: Actively and appropriately engaged in the group. Patient was able to provide support and validation to other group members.Patient practiced active listening when interacting with the facilitator and other group members Patient in still in the process of obtaining treatment goals. Dustin [name] says "I like to talk to anyone, it doesn't have to be family."    Therapeutic Modalities:   Cognitive Behavioral Therapy Solution-Focused Therapy Assertiveness Training Relapse Prevention Therapy   Darin Engels, [place] 09/07/2017 1:55 PM

## 2017-09-07 NOTE — Progress Notes (Signed)
D:Pt denies SI/HI/AVH. Pt is pleasant and cooperative, but can be hard to understand the way he speaks sometimes. Pt. Can be tangential at times. Pt. has no Complaints. Patient Interaction appropriate, but minimal.    A: Q x 15 minute observation checks were completed for safety. Patient was provided with education. Patient was given scheduled medications. Patient  was encourage to attend groups, participate in unit activities and continue with plan of care.   R:Patient is complaint with medication and unit procedures. Pt. Attends groups.              Precautionary checks every 15 minutes for safety maintained, room free of safety hazards, patient sustains no injury or falls during this shift.

## 2017-09-07 NOTE — Plan of Care (Signed)
Thought processes disorganized.  Delusional thinking.  Visible in the milieu.  Attending groups.  Medication compliant.  Support and encouragement offered.  Safety rounds maintained.   Problem: Education: Goal: Knowledge of [place] [facility] Education information/materials will improve Outcome: Progressing Goal: Emotional status will improve Outcome: Progressing Goal: Mental status will improve Outcome: Progressing   Problem: Activity: Goal: Interest or engagement in activities will improve Outcome: Progressing   Problem: Coping: Goal: Ability to identify and develop effective coping behavior will improve Outcome: Progressing   Problem: Self-Concept: Goal: Will verbalize positive feelings about self Outcome: Progressing   Problem: Coping: Goal: Level of anxiety will decrease Outcome: Progressing   Problem: Elimination: Goal: Will not experience complications related to bowel motility Outcome: Progressing

## 2017-09-07 NOTE — Progress Notes (Signed)
[facility] MD Progress Note  09/07/2017 9:43 AM Dustin [name]  MRN:  [id number]   Subjective:  Pt has been calm on the unit with no violence or agitation. He is slightly more organized today but still overall very delusional and incoherent at times. He is upset because "You are all trying to control me against my will. You told me I could leave this week." He states that the medications are "making me sick.I don;'t take medications because the bible is my medication." He is unable to state what symptoms he is having from medications. He is still delusional about hearing from his nose and having a pen in his teeth. He met with chaplain yesterday and is thankful for getting a new bible.   Principal Problem: Paranoid schizophrenia ([place]) Diagnosis:   Patient Active Problem List   Diagnosis Date Noted  . Paranoid schizophrenia ([place]) [F20.0] 09/03/2017    Priority: High  . Schizoaffective disorder, bipolar type ([place]) [F25.0] 01/24/2017    Priority: High  . Tardive dyskinesia [G24.01] 01/24/2017  . Tobacco use disorder [F17.200] 11/29/2016   Total Time spent with patient: 20 minutes  Past Psychiatric History: See H&P  Past Medical History:  Past Medical History:  Diagnosis Date  . Schizophrenia ([place])    History reviewed. No pertinent surgical history. Family History: History reviewed. No pertinent family history. Family Psychiatric  History: See H&P Social History:  Social History   Substance and Sexual Activity  Alcohol Use Yes  . Alcohol/week: 4.2 oz  . Types: 7 Shots of liquor per week   Comment: 1 drink every night- gin     Social History   Substance and Sexual Activity  Drug Use No    Social History   Socioeconomic History  . Marital status: Single    Spouse name: Not on file  . Number of children: Not on file  . Years of education: Not on file  . Highest education level: Not on file  Occupational History  . Not on file  Social Needs  . Financial resource strain: Not on file   . Food insecurity:    Worry: Not on file    Inability: Not on file  . Transportation needs:    Medical: Not on file    Non-medical: Not on file  Tobacco Use  . Smoking status: Current Every Day Smoker    Packs/day: 0.50    Years: 15.00    Pack years: 7.50  . Smokeless tobacco: Never Used  Substance and Sexual Activity  . Alcohol use: Yes    Alcohol/week: 4.2 oz    Types: 7 Shots of liquor per week    Comment: 1 drink every night- gin  . Drug use: No  . Sexual activity: Never    Birth control/protection: None  Lifestyle  . Physical activity:    Days per week: Not on file    Minutes per session: Not on file  . Stress: Not on file  Relationships  . Social connections:    Talks on phone: Not on file    Gets together: Not on file    Attends religious service: Not on file    Active member of club or organization: Not on file    Attends meetings of clubs or organizations: Not on file    Relationship status: Not on file  Other Topics Concern  . Not on file  Social History Narrative  . Not on file   Additional Social History:  Sleep: Fair  Appetite:  Fair  Current Medications: Current Facility-Administered Medications  Medication Dose Route Frequency Provider Last Rate Last Dose  . acetaminophen (TYLENOL) tablet 650 mg  650 mg Oral Q6H PRN Pucilowska, Jolanta B, MD      . alum & mag hydroxide-simeth (MAALOX/MYLANTA) 200-200-20 MG/5ML suspension 30 mL  30 mL Oral Q4H PRN Pucilowska, Jolanta B, MD      . divalproex (DEPAKOTE) DR tablet 500 mg  500 mg Oral BH-q7a Tenya Araque, [name], MD   500 mg at 09/07/17 3664   And  . divalproex (DEPAKOTE) DR tablet 1,000 mg  1,000 mg Oral QHS Iker Nuttall R, MD   1,000 mg at 09/06/17 2112  . haloperidol (HALDOL) tablet 5 mg  5 mg Oral BID Ashaad Gaertner, [name], MD   5 mg at 09/07/17 4034  . hydrOXYzine (ATARAX/VISTARIL) tablet 25 mg  25 mg Oral TID PRN Pucilowska, Jolanta B, MD      . magnesium hydroxide (MILK OF  MAGNESIA) suspension 30 mL  30 mL Oral Daily PRN Pucilowska, Jolanta B, MD      . multivitamin with minerals tablet 1 tablet  1 tablet Oral Daily Pucilowska, Jolanta B, MD   1 tablet at 09/07/17 0856  . nicotine (NICODERM CQ - dosed in mg/24 hours) patch 21 mg  21 mg Transdermal Daily Pucilowska, Jolanta B, MD   Stopped at 09/07/17 0855  . [START ON 09/25/2017] paliperidone (INVEGA SUSTENNA) injection 234 mg  234 mg Intramuscular Q28 days Verdis Koval, [name], MD      . traZODone (DESYREL) tablet 50 mg  50 mg Oral QHS PRN Marylin Crosby, MD   50 mg at 09/06/17 2113    Lab Results:  Results for orders placed or performed during the hospital encounter of 09/03/17 (from the past 48 hour(s))  Urinalysis, Complete w Microscopic     Status: Abnormal   Collection Time: 09/06/17  2:04 PM  Result Value Ref Range   Color, Urine YELLOW (A) YELLOW   APPearance CLEAR (A) CLEAR   Specific Gravity, Urine 1.018 1.005 - 1.030   pH 5.0 5.0 - 8.0   Glucose, UA NEGATIVE NEGATIVE mg/dL   Hgb urine dipstick NEGATIVE NEGATIVE   Bilirubin Urine NEGATIVE NEGATIVE   Ketones, ur 5 (A) NEGATIVE mg/dL   Protein, ur NEGATIVE NEGATIVE mg/dL   Nitrite NEGATIVE NEGATIVE   Leukocytes, UA NEGATIVE NEGATIVE   RBC / HPF 0-5 0 - 5 RBC/hpf   WBC, UA 0-5 0 - 5 WBC/hpf   Bacteria, UA NONE SEEN NONE SEEN   Squamous Epithelial / LPF NONE SEEN NONE SEEN   Mucus PRESENT     Comment: Performed at [facility], [address] [place], [place] [address]    Blood Alcohol level:  Lab Results  Component Value Date   [facility] <10 08/31/2017   ETH <10 56/38/7564    Metabolic Disorder Labs: Lab Results  Component Value Date   HGBA1C 5.4 08/17/2017   MPG 108.28 08/17/2017   MPG 105.41 01/12/2017   No results found for: PROLACTIN Lab Results  Component Value Date   CHOL 131 08/17/2017   TRIG 111 08/17/2017   HDL 46 08/17/2017   CHOLHDL 2.8 08/17/2017   VLDL 22 08/17/2017   LDLCALC 63 08/17/2017   LDLCALC 88 01/12/2017     Physical Findings: AIMS:  , ,  ,  ,    CIWA:    COWS:     Musculoskeletal: Strength & Muscle Tone: within  normal limits Gait & Station: normal Patient leans: N/A  Psychiatric Specialty Exam: Physical Exam  Nursing note and vitals reviewed.   Review of Systems  All other systems reviewed and are negative.   Blood pressure 95/66, pulse (!) 116, temperature 97.6 F (36.4 C), temperature source Oral, resp. rate 18, height 6' (1.829 m), weight 72.6 kg (160 lb 0.9 oz), SpO2 100 %.Body mass index is 21.71 kg/m.  General Appearance: Casual  Eye Contact:  Fair  Speech:  Garbled  Volume:  Normal  Mood:  Euthymic  Affect:  Appropriate  Thought Process:  Disorganized  Orientation:  Full (Time, Place, and Person)  Thought Content:  Illogical  Suicidal Thoughts:  No  Homicidal Thoughts:  No  Memory:  Immediate;   Fair  Judgement:  Impaired  Insight:  Lacking  Psychomotor Activity:  Normal  Concentration:  Concentration: Poor  Recall:  [employer] of Knowledge:  Fair  Language:  Fair  Akathisia:  No      Assets:  Resilience  ADL's:  Intact  Cognition:  Impaired,  Mild  Sleep:  Number of Hours: 4.25     Treatment Plan Summary: 62 yo male admitted due to psychosis. He continues to be disorganized and delusional with slight improvement this morning. He has not been agitated at all on the unit and has been very calm and interacting well with other peers.   Plan:  Schizoaffective disorder -he is on [place]. Next dose due 4/23 -Continue Haldol 5 mg BID. This was increased for today. Will plan to transition to [place]. Will try to have injections close together so he can get the injections at the same time each month. There has been difficulty with getting him to get his injections every 2 weeks (when he was on Prolixin) -Continue Depakote 500 mg qam and 1000 mg qhs. Check level on Saturday -BP looks okay. Will monitor -QTc 399  UA negative for  UTI  Dispo -He follows with [name]. I have been in contact with his outpatient psychiatrist.   Marylin Crosby, MD 09/07/2017, 9:43 AM

## 2017-09-08 NOTE — BHH Group Notes (Signed)
LCSW Group Therapy Note  09/08/2017 1:15pm  Type of Therapy and Topic:  Group Therapy:  Fears and Unhealthy Coping Skills  Participation Level:  Did Not Attend   Description of Group:  The focus of this group was to discuss some of the prevalent fears that patients experience, and to identify the commonalities among group members.  An exercise was used to initiate the discussion, followed by writing on the white board a group-generated list of unhealthy coping and healthy coping techniques to deal with each fear.    Therapeutic Goals: 1. Patient will identify and describe 3 fears they experience 2. Patient will identify one positive coping strategy for each fear they experience 3. Patient will respond empathically to peers statements regarding fears they experience  Summary of Patient Progress:  Pt invited to group but did not attend.      Therapeutic Modalities Cognitive Behavioral Therapy Motivational Interviewing  Ludy Messamore  CUEBAS-COLON, LCSW 09/08/2017 12:54 PM  

## 2017-09-08 NOTE — Plan of Care (Signed)
Denies SI/HI/AVH.   No inappropriate behavior noted.  Support and encouragement offered.  Safety rounds maintained.    Problem: Activity: Problem: Education: Goal: Emotional status will improve Outcome: Progressing Goal: Mental status will improve Outcome: Progressing  Goal: Interest or engagement in activities will improve Outcome: Progressing   Problem: Coping: Goal: Ability to identify and develop effective coping behavior will improve Outcome: Progressing   Problem: Self-Concept: Goal: Will verbalize positive feelings about self Outcome: Progressing   Problem: Coping: Goal: Level of anxiety will decrease Outcome: Progressing   Problem: Elimination: Goal: Will not experience complications related to bowel motility Outcome: Progressing

## 2017-09-08 NOTE — Progress Notes (Signed)
[facility] MD Progress Note  09/08/2017 5:35 PM Dustin [name]  MRN:  [id number]   Subjective:    He patient slept over 7 hours last night. He does still have some paranoid and delusional thoughts about having a pin in the gum of his teeth that has special powers. He believes that he can hear through his nose as his as he feels his nose has supernatural powers. He was mildly hyperreligious but no other manic symptoms including grandiose delusions or decreased sleep with increased goal-directed behavior.He has been calm on the un and is cooperative with the nursing staff. He has been compliant with medications. He denies any current active or passive suicidal thoughts. He denies any auditory or visual hallucinations. Appetite is good.[name] He did attend some groups today. He is willing to follow up with [facility] ACT team but does not believe he has a diagnosis of schizoaffective disorder. VSS and no somatic complaints.   Past Psychiatric History: Long history of mental illness. He follows with [name]. He was on Prolixin and Abilify in the past. He has had many inpatient hospitalizations-last was recently.   Social History: Pt is his own legal guardian. He lives with his brother in a trailer. He is on disability    Principal Problem: Paranoid schizophrenia ([place]) Diagnosis:   Patient Active Problem List   Diagnosis Date Noted  . Paranoid schizophrenia ([place]) [F20.0] 09/03/2017  . Schizoaffective disorder, bipolar type ([place]) [F25.0] 01/24/2017  . Tardive dyskinesia [G24.01] 01/24/2017  . Tobacco use disorder [F17.200] 11/29/2016   Total Time spent with patient: 20 minutes  Past Psychiatric History: See H&P  Past Medical History:  Past Medical History:  Diagnosis Date  . Schizophrenia ([place])    History reviewed. No pertinent surgical history. Family History: History reviewed. No pertinent family history. Family Psychiatric  History: See H&P Social History:  Social History    Substance and Sexual Activity  Alcohol Use Yes  . Alcohol/week: 4.2 oz  . Types: 7 Shots of liquor per week   Comment: 1 drink every night- gin     Social History   Substance and Sexual Activity  Drug Use No    Social History   Socioeconomic History  . Marital status: Single    Spouse name: Not on file  . Number of children: Not on file  . Years of education: Not on file  . Highest education level: Not on file  Occupational History  . Not on file  Social Needs  . Financial resource strain: Not on file  . Food insecurity:    Worry: Not on file    Inability: Not on file  . Transportation needs:    Medical: Not on file    Non-medical: Not on file  Tobacco Use  . Smoking status: Current Every Day Smoker    Packs/day: 0.50    Years: 15.00    Pack years: 7.50  . Smokeless tobacco: Never Used  Substance and Sexual Activity  . Alcohol use: Yes    Alcohol/week: 4.2 oz    Types: 7 Shots of liquor per week    Comment: 1 drink every night- gin  . Drug use: No  . Sexual activity: Never    Birth control/protection: None  Lifestyle  . Physical activity:    Days per week: Not on file    Minutes per session: Not on file  . Stress: Not on file  Relationships  . Social connections:    Talks on phone:  Not on file    Gets together: Not on file    Attends religious service: Not on file    Active member of club or organization: Not on file    Attends meetings of clubs or organizations: Not on file    Relationship status: Not on file  Other Topics Concern  . Not on file  Social History Narrative  . Not on file   Additional Social History:                         Sleep: Good  Appetite:  Good  Current Medications: Current Facility-Administered Medications  Medication Dose Route Frequency Provider Last Rate Last Dose  . acetaminophen (TYLENOL) tablet 650 mg  650 mg Oral Q6H PRN Pucilowska, Jolanta B, MD      . alum & mag hydroxide-simeth (MAALOX/MYLANTA)  200-200-20 MG/5ML suspension 30 mL  30 mL Oral Q4H PRN Pucilowska, Jolanta B, MD      . divalproex (DEPAKOTE) DR tablet 500 mg  500 mg Oral BH-q7a [name], [name], MD   500 mg at 09/08/17 8416   And  . divalproex (DEPAKOTE) DR tablet 1,000 mg  1,000 mg Oral QHS [name], [name], MD   1,000 mg at 09/07/17 2108  . haloperidol (HALDOL) tablet 5 mg  5 mg Oral BID [name], [name], MD   5 mg at 09/08/17 6063  . hydrOXYzine (ATARAX/VISTARIL) tablet 25 mg  25 mg Oral TID PRN Pucilowska, Jolanta B, MD      . magnesium hydroxide (MILK OF MAGNESIA) suspension 30 mL  30 mL Oral Daily PRN Pucilowska, Jolanta B, MD      . multivitamin with minerals tablet 1 tablet  1 tablet Oral Daily Pucilowska, Jolanta B, MD   1 tablet at 09/08/17 1712  . nicotine (NICODERM CQ - dosed in mg/24 hours) patch 21 mg  21 mg Transdermal Daily Pucilowska, Jolanta B, MD   Stopped at 09/07/17 0855  . [START ON 09/25/2017] paliperidone (INVEGA SUSTENNA) injection 234 mg  234 mg Intramuscular Q28 days [name], [name], MD      . traZODone (DESYREL) tablet 50 mg  50 mg Oral QHS PRN Marylin Crosby, MD   50 mg at 09/06/17 2113    Lab Results:  No results found for this or any previous visit (from the past 48 hour(s)).  Blood Alcohol level:  Lab Results  Component Value Date   ETH <10 08/31/2017   ETH <10 01/60/1093    Metabolic Disorder Labs: Lab Results  Component Value Date   HGBA1C 5.4 08/17/2017   MPG 108.28 08/17/2017   MPG 105.41 01/12/2017   No results found for: PROLACTIN Lab Results  Component Value Date   CHOL 131 08/17/2017   TRIG 111 08/17/2017   HDL 46 08/17/2017   CHOLHDL 2.8 08/17/2017   VLDL 22 08/17/2017   LDLCALC 63 08/17/2017   LDLCALC 88 01/12/2017    Physical Findings: AIMS: Facial and Oral Movements Muscles of Facial Expression: None, normal Lips and Perioral Area: None, normal Jaw: None, normal Tongue: None, normal,Extremity Movements Upper (arms, wrists, hands, fingers): None, normal Lower  (legs, knees, ankles, toes): None, normal, Trunk Movements Neck, shoulders, hips: None, normal, Overall Severity Severity of abnormal movements (highest score from questions above): None, normal Incapacitation due to abnormal movements: None, normal Patient's awareness of abnormal movements (rate only patient's report): No Awareness, Dental Status Current problems with teeth and/or dentures?: No Does patient usually wear  dentures?: No  CIWA:    COWS:     Musculoskeletal: Strength & Muscle Tone: within normal limits Gait & Station: normal Patient leans: N/A  Psychiatric Specialty Exam: Physical Exam  Nursing note and vitals reviewed.   Review of Systems  Constitutional: Negative.   HENT: Negative.   Eyes: Negative.   Respiratory: Negative.   Cardiovascular: Negative.   Gastrointestinal: Negative.   Genitourinary: Negative.   Musculoskeletal: Negative.   Skin: Negative.   Neurological: Negative.   Endo/Heme/Allergies: Negative.     Blood pressure 91/72, pulse (!) 123, temperature 98.3 F (36.8 C), temperature source Oral, resp. rate 18, height 6' (1.829 m), weight 72.6 kg (160 lb 0.9 oz), SpO2 100 %.Body mass index is 21.71 kg/m.  General Appearance: Casual  Eye Contact:  Fair  Speech:  Garbled  Volume:  Normal  Mood:  "OK"  Affect:  Blunt  Thought Process:  Disorganized  Orientation:  Full (Time, Place, and Person)  Thought Content:  Illogical  Suicidal Thoughts:  No  Homicidal Thoughts:  No  Memory:  Immediate;   Fair  Judgement:  Impaired  Insight:  Lacking  Psychomotor Activity:  Normal  Concentration:  Concentration: Poor  Recall:  [employer] of Knowledge:  Fair  Language:  Fair  Akathisia:  No      Assets:  Resilience  ADL's:  Intact  Cognition:  Impaired,  Mild  Sleep:  Number of Hours: 7.15     Treatment Plan Summary: 62 yo male admitted due to psychosis. He continues to be disorganized and delusional with slight improvement this morning. He has  not been agitated at all on the unit and has been very calm and interacting well with other peers.   Plan:  Schizoaffective disorder -he is on [place]. Next dose due 09/25/17 -Continue Haldol 5 mg BIDfor psychosis with a plan to transition to Haldol [place]. Will try to have injections close together so he can get the injections at the same time each month. There has been difficulty with getting him to get his injections every 2 weeks (when he was on Prolixin) -Continue Depakote 500 mg qam and 1000 mg qhs. We'll check valproic acid level and LFTs tomorrow morning. -Vital signs are stable. Will monitor -Total cholesterol was 131 and hemoglob A1c was 5.4. -QTc 399  UA negative for UTI  Dispo -He follows with [name]. Dr Wonda Olds has been in contact with his outpatient psychiatrist.   Chauncey Mann, MD 09/08/2017, 5:35 PM

## 2017-09-09 LAB — COMPREHENSIVE METABOLIC PANEL
ALK PHOS: 75 U/L (ref 38–126)
ALT: 16 U/L — AB (ref 17–63)
AST: 21 U/L (ref 15–41)
Albumin: 3.5 g/dL (ref 3.5–5.0)
Anion gap: 5 (ref 5–15)
BILIRUBIN TOTAL: 0.5 mg/dL (ref 0.3–1.2)
BUN: 19 mg/dL (ref 6–20)
CALCIUM: 8.8 mg/dL — AB (ref 8.9–10.3)
CO2: 25 mmol/L (ref 22–32)
CREATININE: 1.47 mg/dL — AB (ref 0.61–1.24)
Chloride: 108 mmol/L (ref 101–111)
GFR, EST AFRICAN AMERICAN: 58 mL/min — AB (ref >60)
GFR, EST NON AFRICAN AMERICAN: 50 mL/min — AB (ref >60)
Glucose, Bld: 134 mg/dL — ABNORMAL HIGH (ref 65–99)
Potassium: 4.3 mmol/L (ref 3.5–5.1)
SODIUM: 138 mmol/L (ref 135–145)
TOTAL PROTEIN: 6.7 g/dL (ref 6.5–8.1)

## 2017-09-09 LAB — VALPROIC ACID LEVEL: Valproic Acid Lvl: 75 ug/mL (ref 50.0–100.0)

## 2017-09-09 NOTE — Progress Notes (Signed)
D:Pt denies SI/HI/AVH. Pt is pleasant and cooperative, but continues to be hard to understand the way he speaks occasionally. Pt. Can be tangential at times. Pt.has no Complaints.Patient Interaction appropriate, but minimal.  Pt. Reports doing good this evening.  A: Q x 15 minute observation checks were completed for safety. Patient was provided with education. Patient was given scheduled medications. Patient was encourage to attend groups, participate in unit activities and continue with plan of care.   R:Patient is complaint with medication and unit procedures. Pt. Does not Attend groups.               Precautionary checks every 15 minutes for safety maintained, room free of safety hazards, patient sustains no injury or falls during this shift.

## 2017-09-09 NOTE — Plan of Care (Signed)
Pt. Verbalizes understanding of provided education. Pt. Does not Attend groups. Pt. Verbalizes he is doing, "pretty good" this evening and states he can remain safe while on the unit. Verbally contracts for safety.       Problem: Activity: Goal: Interest or engagement in activities will improve Outcome: Not Progressing   Problem: Education: Goal: Knowledge of [place] [facility] Education information/materials will improve Outcome: Progressing Goal: Emotional status will improve Outcome: Progressing

## 2017-09-09 NOTE — Plan of Care (Signed)
Pt. Verbalizes understanding of provided education. Pt. Does not Attend groups. Pt. Verbalizes he is doing, "good" this evening and states he can remain safe while on the unit. Verbally contracts for safety.    Problem: Activity: Goal: Interest or engagement in activities will improve Outcome: Not Progressing   Problem: Education: Goal: Knowledge of [place] [facility] Education information/materials will improve Outcome: Progressing

## 2017-09-09 NOTE — BHH Group Notes (Signed)
LCSW Group Therapy Note 09/09/2017 1:15pm  Type of Therapy and Topic: Group Therapy: Feelings Around Returning Home & Establishing a Supportive Framework and Supporting Oneself When Supports Not Available  Participation Level: Did Not Attend  Description of Group:  Patients first processed thoughts and feelings about upcoming discharge. These included fears of upcoming changes, lack of change, new living environments, judgements and expectations from others and overall stigma of mental health issues. The group then discussed the definition of a supportive framework, what that looks and feels like, and how do to discern it from an unhealthy non-supportive network. The group identified different types of supports as well as what to do when your family/friends are less than helpful or unavailable  Therapeutic Goals  1. Patient will identify one healthy supportive network that they can use at discharge. 2. Patient will identify one factor of a supportive framework and how to tell it from an unhealthy network. 3. Patient able to identify one coping skill to use when they do not have positive supports from others. 4. Patient will demonstrate ability to communicate their needs through discussion and/or role plays.  Summary of Patient Progress:  Patient was invited to group but did not attend.   Therapeutic Modalities Cognitive Behavioral Therapy Motivational Interviewing   Byrd Terrero  CUEBAS-COLON, LCSW 09/09/2017 11:50 AM

## 2017-09-09 NOTE — Plan of Care (Signed)
Patient is alert and oriented, denies SI, HI and AVH. Patient affect is pleasant,but more isolated to his room than last week. Patient is compliant with medications but speech is still incoherent at times due to rapid speech. Patient shook his head "No" when asked if he was having any type of pain. Patient grabbed his bible when taking medications, patient did not want to swallow pills in the beginning but once he grabbed his bible he was able to take the medication. Patient is eating meals Adequately and attends groups. Nurse will continue to monitor, safety checks Q 15 minutes. Problem: Activity: Goal: Interest or engagement in activities will improve 09/09/2017 1300 by Geraldo Docker, RN Outcome: Progressing 09/09/2017 1259 by Geraldo Docker, RN Outcome: Progressing   Problem: Coping: Goal: Ability to identify and develop effective coping behavior will improve 09/09/2017 1300 by Geraldo Docker, RN Outcome: Progressing 09/09/2017 1259 by Geraldo Docker, RN Outcome: Progressing   Problem: Self-Concept: Goal: Will verbalize positive feelings about self 09/09/2017 1300 by Geraldo Docker, RN Outcome: Not Progressing 09/09/2017 1259 by Geraldo Docker, RN Outcome: Progressing   Problem: Coping: Goal: Level of anxiety will decrease 09/09/2017 1300 by Geraldo Docker, RN Outcome: Progressing 09/09/2017 1259 by Geraldo Docker, RN Outcome: Progressing

## 2017-09-09 NOTE — Progress Notes (Signed)
[facility] MD Progress Note  09/09/2017 6:34 PM Dustin [name]  MRN:  [id number]   Subjective:     The patient continues to have some odd delusions about supernatural powers of his nose being able to hear.He does not believe that he has a diagnosis of schizoaffective disorder and insight is poor. At times, speech is tangential and pressured and difficult to understand. The patient is mildly hyperreligious but no other major manic symptoms. He denies any auditory or visual hallucinations. He has been cooperative on the unit and calm. The patient has been fairly isolative to his room throughout the day today but later when outside with the other patients. He denies any current active or passive suicidal thoughts and mood is "okay". He says he is feeling better today overall. He denies any problems with insomnia and slept over 7 hours last night. Appetite is good. Vital signs are stable. No somatic complaints. He is very concerned that Education officer, museum get in touch with his payee which is his brother.   Past Psychiatric History: Long history of mental illness. He follows with [name]. He was on Prolixin and Abilify in the past. He has had many inpatient hospitalizations-last was recently.   Social History: Pt is his own legal guardian. He lives with his brother in a trailer. He is on disability    Principal Problem: Paranoid schizophrenia ([place]) Diagnosis:   Patient Active Problem List   Diagnosis Date Noted  . Paranoid schizophrenia ([place]) [F20.0] 09/03/2017  . Schizoaffective disorder, bipolar type ([place]) [F25.0] 01/24/2017  . Tardive dyskinesia [G24.01] 01/24/2017  . Tobacco use disorder [F17.200] 11/29/2016   Total Time spent with patient: 20 minutes  Past Psychiatric History: See H&P  Past Medical History:  Past Medical History:  Diagnosis Date  . Schizophrenia ([place])    History reviewed. No pertinent surgical history. Family History: History reviewed. No pertinent family  history. Family Psychiatric  History: See H&P Social History:  Social History   Substance and Sexual Activity  Alcohol Use Yes  . Alcohol/week: 4.2 oz  . Types: 7 Shots of liquor per week   Comment: 1 drink every night- gin     Social History   Substance and Sexual Activity  Drug Use No    Social History   Socioeconomic History  . Marital status: Single    Spouse name: Not on file  . Number of children: Not on file  . Years of education: Not on file  . Highest education level: Not on file  Occupational History  . Not on file  Social Needs  . Financial resource strain: Not on file  . Food insecurity:    Worry: Not on file    Inability: Not on file  . Transportation needs:    Medical: Not on file    Non-medical: Not on file  Tobacco Use  . Smoking status: Current Every Day Smoker    Packs/day: 0.50    Years: 15.00    Pack years: 7.50  . Smokeless tobacco: Never Used  Substance and Sexual Activity  . Alcohol use: Yes    Alcohol/week: 4.2 oz    Types: 7 Shots of liquor per week    Comment: 1 drink every night- gin  . Drug use: No  . Sexual activity: Never    Birth control/protection: None  Lifestyle  . Physical activity:    Days per week: Not on file    Minutes per session: Not on file  . Stress: Not  on file  Relationships  . Social connections:    Talks on phone: Not on file    Gets together: Not on file    Attends religious service: Not on file    Active member of club or organization: Not on file    Attends meetings of clubs or organizations: Not on file    Relationship status: Not on file  Other Topics Concern  . Not on file  Social History Narrative  . Not on file     Sleep: Good  Appetite:  Good  Current Medications: Current Facility-Administered Medications  Medication Dose Route Frequency Provider Last Rate Last Dose  . acetaminophen (TYLENOL) tablet 650 mg  650 mg Oral Q6H PRN Pucilowska, Jolanta B, MD      . alum & mag hydroxide-simeth  (MAALOX/MYLANTA) 200-200-20 MG/5ML suspension 30 mL  30 mL Oral Q4H PRN Pucilowska, Jolanta B, MD      . divalproex (DEPAKOTE) DR tablet 500 mg  500 mg Oral BH-q7a [name], [name], MD   500 mg at 09/09/17 9629   And  . divalproex (DEPAKOTE) DR tablet 1,000 mg  1,000 mg Oral QHS [name], [name], MD   1,000 mg at 09/08/17 2126  . haloperidol (HALDOL) tablet 5 mg  5 mg Oral BID Marylin Crosby, MD   5 mg at 09/09/17 5284  . hydrOXYzine (ATARAX/VISTARIL) tablet 25 mg  25 mg Oral TID PRN Pucilowska, Jolanta B, MD      . magnesium hydroxide (MILK OF MAGNESIA) suspension 30 mL  30 mL Oral Daily PRN Pucilowska, Jolanta B, MD      . multivitamin with minerals tablet 1 tablet  1 tablet Oral Daily Pucilowska, Jolanta B, MD   1 tablet at 09/09/17 0838  . nicotine (NICODERM CQ - dosed in mg/24 hours) patch 21 mg  21 mg Transdermal Daily Pucilowska, Jolanta B, MD   Stopped at 09/07/17 0855  . [START ON 09/25/2017] paliperidone (INVEGA SUSTENNA) injection 234 mg  234 mg Intramuscular Q28 days [name], [name], MD      . traZODone (DESYREL) tablet 50 mg  50 mg Oral QHS PRN Marylin Crosby, MD   50 mg at 09/06/17 2113    Lab Results:  Results for orders placed or performed during the hospital encounter of 09/03/17 (from the past 48 hour(s))  Valproic acid level     Status: None   Collection Time: 09/09/17  7:25 AM  Result Value Ref Range   Valproic Acid Lvl 75 50.0 - 100.0 ug/mL    Comment: Performed at [facility], [address] [place], [place] [address]  Comprehensive metabolic panel     Status: Abnormal   Collection Time: 09/09/17  7:25 AM  Result Value Ref Range   Sodium 138 135 - 145 mmol/L   Potassium 4.3 3.5 - 5.1 mmol/L   Chloride 108 101 - 111 mmol/L   CO2 25 22 - 32 mmol/L   Glucose, Bld 134 (H) 65 - 99 mg/dL   BUN 19 6 - 20 mg/dL   Creatinine, Ser 1.47 (H) 0.61 - 1.24 mg/dL   Calcium 8.8 (L) 8.9 - 10.3 mg/dL   Total Protein 6.7 6.5 - 8.1 g/dL   Albumin 3.5 3.5 - 5.0 g/dL   AST 21 15  - 41 U/L   ALT 16 (L) 17 - 63 U/L   Alkaline Phosphatase 75 38 - 126 U/L   Total Bilirubin 0.5 0.3 - 1.2 mg/dL   GFR calc non  Af Amer 50 (L) >60 mL/min   GFR calc Af Amer 58 (L) >60 mL/min    Comment: (NOTE) The eGFR has been calculated using the CKD EPI equation. This calculation has not been validated in all clinical situations. eGFR's persistently <60 mL/min signify possible Chronic Kidney Disease.    Anion gap 5 5 - 15    Comment: Performed at [facility], [address] [place], [place] [address]    Blood Alcohol level:  Lab Results  Component Value Date   [facility] <10 08/31/2017   ETH <10 59/93/5701    Metabolic Disorder Labs: Lab Results  Component Value Date   HGBA1C 5.4 08/17/2017   MPG 108.28 08/17/2017   MPG 105.41 01/12/2017   No results found for: PROLACTIN Lab Results  Component Value Date   CHOL 131 08/17/2017   TRIG 111 08/17/2017   HDL 46 08/17/2017   CHOLHDL 2.8 08/17/2017   VLDL 22 08/17/2017   LDLCALC 63 08/17/2017   LDLCALC 88 01/12/2017    Physical Findings: AIMS: Facial and Oral Movements Muscles of Facial Expression: None, normal Lips and Perioral Area: None, normal Jaw: None, normal Tongue: None, normal,Extremity Movements Upper (arms, wrists, hands, fingers): None, normal Lower (legs, knees, ankles, toes): None, normal, Trunk Movements Neck, shoulders, hips: None, normal, Overall Severity Severity of abnormal movements (highest score from questions above): None, normal Incapacitation due to abnormal movements: None, normal Patient's awareness of abnormal movements (rate only patient's report): No Awareness, Dental Status Current problems with teeth and/or dentures?: No Does patient usually wear dentures?: No  CIWA:    COWS:     Musculoskeletal: Strength & Muscle Tone: within normal limits Gait & Station: normal Patient leans: N/A  Psychiatric Specialty Exam: Physical Exam  Nursing note and vitals reviewed.   Review  of Systems  Constitutional: Negative.   HENT: Negative.   Eyes: Negative.   Respiratory: Negative.   Cardiovascular: Negative.   Gastrointestinal: Negative.   Genitourinary: Negative.   Musculoskeletal: Negative.   Skin: Negative.   Neurological: Negative.   Endo/Heme/Allergies: Negative.     Blood pressure 100/76, pulse (!) 107, temperature 97.8 F (36.6 C), temperature source Oral, resp. rate 17, height 6' (1.829 m), weight 72.6 kg (160 lb 0.9 oz), SpO2 100 %.Body mass index is 21.71 kg/m.  General Appearance: Casual  Eye Contact:  Fair  Speech:  Garbled  Volume:  Normal  Mood:  "I feel a little better today"  Affect:  Blunt  Thought Process:  Tangential  Orientation:  Full (Time, Place, and Person)  Thought Content:  Illogical  Suicidal Thoughts:  No  Homicidal Thoughts:  No  Memory:  Immediate;   Fair  Judgement:  Impaired  Insight:  Lacking  Psychomotor Activity:  Normal  Concentration:  Concentration: Poor  Recall:  [employer] of Knowledge:  Fair  Language:  Fair  Akathisia:  No      Assets:  Resilience  ADL's:  Intact  Cognition:  Impaired,  Mild  Sleep:  Number of Hours: 5.45     Treatment Plan Summary: 62 yo male admitted due to psychosis. He continues to be disorganized and delusional with slight improvement this morning. He has not been agitated at all on the unit and has been very calm and interacting well with other peers.   Plan:  Schizoaffective disorder -He is on [place]. Next dose due 09/25/17 -Continue Haldol 5 mg BIDfor psychosis with a plan to transition to Haldol [place]. Will try to  have injections close together so he can get the injections at the same time each month. There has been difficulty with getting him to get his injections every 2 weeks (when he was on Prolixin) -Continue Depakote 500 mg qam and 1000 mg qhs. VPA is 75 and LFTs WNL. -Vital signs are stable. Will monitor -Total cholesterol was 131 and hemoglob A1c was  5.4. -QTc 399  UA negative for UTI  Dispo -He follows with [name]. Dr Wonda Olds has been in contact with his outpatient psychiatrist.   Chauncey Mann, MD 09/09/2017, 6:34 PM

## 2017-09-09 NOTE — Progress Notes (Signed)
D:Pt denies SI/HI/AVH. Pt is pleasant and cooperative, but continues to be hard to understand the way he speaks frequently.Pt. Can be tangential periodically, but mostly logical.Pt.has noComplaints.Patient Interactionappropriate, but minimal.Pt. Reports doing, "pretty good" this evening. Pt. Observed watching tv and eating good. Sleep reports good.   A: Q x 15 minute observation checks were completed for safety. Patient was provided with education. Patient was given scheduled medications. Patient was encourage to attend groups, participate in unit activities and continue with plan of care.   R:Patient is complaint with medication and unit procedures. Pt. Does not Attend groups.             Precautionary checks every 15 minutes for safety maintained, room free of safety hazards, patient sustains no injury or falls during this shift.

## 2017-09-10 NOTE — [facility] Group Notes (Signed)
[place] Group Notes:  (Nursing/MHT/Case Management/Adjunct)  Date:  09/10/2017  Time:  4:17 PM  Type of Therapy:  Psychoeducational Skills  Participation Level:  Active  Participation Quality:  Appropriate  Affect:  Appropriate  Cognitive:  Appropriate  Insight:  Appropriate  Engagement in Group:  Engaged  Modes of Intervention:  Socialization  Summary of Progress/Problems:  Dustin [name] 09/10/2017, 4:17 PM

## 2017-09-10 NOTE — Plan of Care (Signed)
  Problem: Education: Goal: Knowledge of [facility] Education information/materials will improve Outcome: Progressing Goal: Emotional status will improve Outcome: Progressing Goal: Mental status will improve Outcome: Progressing  Denies SI/HI/AVH.  Denies any depression.  Speech is tangential.  Makes sexually inappropriate comments at times.  Visible in the milieu.  Attending groups.  Medication compliant.  Good appetite.  Maintains personal care chores.  Support and encouragement offered.  Safety rounds maintained.l   Problem: Activity: Goal: Interest or engagement in activities will improve Outcome: Progressing   Problem: Coping: Goal: Ability to identify and develop effective coping behavior will improve Outcome: Progressing   Problem: Self-Concept: Goal: Will verbalize positive feelings about self Outcome: Progressing   Problem: Coping: Goal: Level of anxiety will decrease Outcome: Progressing   Problem: Elimination: Goal: Will not experience complications related to bowel motility Outcome: Progressing

## 2017-09-10 NOTE — Tx Team (Signed)
Interdisciplinary Treatment and Diagnostic Plan Update  09/10/2017 Time of Session: 11am [name] MRN: [id number]  Principal Diagnosis: Paranoid schizophrenia [facility])  Secondary Diagnoses: Principal Problem:   Paranoid schizophrenia ([place])   Current Medications:  Current Facility-Administered Medications  Medication Dose Route Frequency Provider Last Rate Last Dose  . acetaminophen (TYLENOL) tablet 650 mg  650 mg Oral Q6H PRN Pucilowska, Jolanta B, MD      . alum & mag hydroxide-simeth (MAALOX/MYLANTA) 200-200-20 MG/5ML suspension 30 mL  30 mL Oral Q4H PRN Pucilowska, Jolanta B, MD      . divalproex (DEPAKOTE) DR tablet 500 mg  500 mg Oral BH-q7a [name], [name], MD   500 mg at 09/10/17 5732   And  . divalproex (DEPAKOTE) DR tablet 1,000 mg  1,000 mg Oral QHS [name], [name], MD   1,000 mg at 09/09/17 2123  . haloperidol (HALDOL) tablet 5 mg  5 mg Oral BID Marylin Crosby, MD   5 mg at 09/10/17 0908  . hydrOXYzine (ATARAX/VISTARIL) tablet 25 mg  25 mg Oral TID PRN Pucilowska, Jolanta B, MD      . magnesium hydroxide (MILK OF MAGNESIA) suspension 30 mL  30 mL Oral Daily PRN Pucilowska, Jolanta B, MD      . multivitamin with minerals tablet 1 tablet  1 tablet Oral Daily Pucilowska, Jolanta B, MD   1 tablet at 09/10/17 0908  . nicotine (NICODERM CQ - dosed in mg/24 hours) patch 21 mg  21 mg Transdermal Daily Pucilowska, Jolanta B, MD   Stopped at 09/07/17 0855  . [START ON 09/25/2017] paliperidone (INVEGA SUSTENNA) injection 234 mg  234 mg Intramuscular Q28 days [name], [name], MD      . traZODone (DESYREL) tablet 50 mg  50 mg Oral QHS PRN Marylin Crosby, MD   50 mg at 09/06/17 2113   PTA Medications: Medications Prior to Admission  Medication Sig Dispense Refill Last Dose  . divalproex (DEPAKOTE) 500 MG DR tablet Take 1 tablet (500 mg total) by mouth every 12 (twelve) hours. (Patient not taking: Reported on 09/01/2017) 60 tablet 0 Not Taking at Unknown time  . Multiple Vitamin (MULTIVITAMIN)  tablet Take 1 tablet by mouth daily.   Past Week at Unknown time  . Omega-3 Fatty Acids (OMEGA-3 FISH OIL PO) Take 1 capsule by mouth daily.   Past Week at Unknown time  . [START ON 09/25/2017] paliperidone (INVEGA SUSTENNA) 234 MG/1.5ML SUSP injection Inject 234 mg into the muscle once for 1 dose. (Patient not taking: Reported on 09/01/2017) 1.5 mL 0 Not Taking at Unknown time    Patient Stressors: Financial difficulties Marital or family conflict Medication change or noncompliance  Patient Strengths: Active sense of humor Communication skills Supportive family/friends  Treatment Modalities: Medication Management, Group therapy, Case management,  1 to 1 session with clinician, Psychoeducation, Recreational therapy.   Physician Treatment Plan for Primary Diagnosis: Paranoid schizophrenia ([place]) Long Term Goal(s): Improvement in symptoms so as ready for discharge   Short Term Goals: Ability to verbalize feelings will improve  Medication Management: Evaluate patient's response, side effects, and tolerance of medication regimen.  Therapeutic Interventions: 1 to 1 sessions, Unit Group sessions and Medication administration.  Evaluation of Outcomes: Progressing  Physician Treatment Plan for Secondary Diagnosis: Principal Problem:   Paranoid schizophrenia ([place])  Long Term Goal(s): Improvement in symptoms so as ready for discharge   Short Term Goals: Ability to verbalize feelings will improve     Medication Management: Evaluate patient's response,  side effects, and tolerance of medication regimen.  Therapeutic Interventions: 1 to 1 sessions, Unit Group sessions and Medication administration.  Evaluation of Outcomes: Progressing   RN Treatment Plan for Primary Diagnosis: Paranoid schizophrenia ([place]) Long Term Goal(s): Knowledge of disease and therapeutic regimen to maintain health will improve  Short Term Goals: Ability to demonstrate self-control, Ability to participate in decision  making will improve, Ability to identify and develop effective coping behaviors will improve and Compliance with prescribed medications will improve  Medication Management: RN will administer medications as ordered by provider, will assess and evaluate patient's response and provide education to patient for prescribed medication. RN will report any adverse and/or side effects to prescribing provider.  Therapeutic Interventions: 1 on 1 counseling sessions, Psychoeducation, Medication administration, Evaluate responses to treatment, Monitor vital signs and CBGs as ordered, Perform/monitor CIWA, COWS, AIMS and Fall Risk screenings as ordered, Perform wound care treatments as ordered.  Evaluation of Outcomes: Progressing   LCSW Treatment Plan for Primary Diagnosis: Paranoid schizophrenia ([place]) Long Term Goal(s): Safe transition to appropriate next level of care at discharge, Engage patient in therapeutic group addressing interpersonal concerns.  Short Term Goals: Engage patient in aftercare planning with referrals and resources, Facilitate acceptance of mental health diagnosis and concerns, Facilitate patient progression through stages of change regarding substance use diagnoses and concerns, Identify triggers associated with mental health/substance abuse issues and Increase skills for wellness and recovery  Therapeutic Interventions: Assess for all discharge needs, 1 to 1 time with Social worker, Explore available resources and support systems, Assess for adequacy in community support network, Educate family and significant other(s) on suicide prevention, Complete Psychosocial Assessment, Interpersonal group therapy.  Evaluation of Outcomes: Progressing   Progress in Treatment: Attending groups: Yes. Participating in groups: Yes. Taking medication as prescribed: Yes. Toleration medication: Yes. Family/Significant other contact made: Yes, individual(s) contacted:  Patients Brother  [name] Patient understands diagnosis: Yes. Discussing patient identified problems/goals with staff: Yes. Medical problems stabilized or resolved: Yes. Denies suicidal/homicidal ideation: Yes. Issues/concerns per patient self-inventory: No. Other:  New problem(s) identified: No, Describe:  None  New Short Term/Long Term Goal(s): "To get out of here."  Discharge Plan or Barriers: . At discharge, patient will return back home with his brother and continue to follow up with his ACTT Team.   Reason for Continuation of Hospitalization: Medication stabilization  Estimated Length of Stay: 7 days  Attendees: Patient: Dustin [name] 09/10/2017 11:38 AM  Physician: Amador Cunas, MD 09/10/2017 11:38 AM  Nursing: Elige Radon, RN 09/10/2017 11:38 AM  RN Care Manager: 09/10/2017 11:38 AM  Social Worker: Darin Engels, [place] 09/10/2017 11:38 AM  Recreational Therapist: Isaias Sakai. Marcello Fennel, LRT 09/10/2017 11:38 AM  Other:n Alden Hipp, LCSW  09/10/2017 11:38 AM  Other: Dossie Arbour, LCSW 09/10/2017 11:38 AM  Other: 09/10/2017 11:38 AM    Scribe for Treatment Team: Darin Engels, LCSW 09/10/2017 11:38 AM

## 2017-09-10 NOTE — Progress Notes (Signed)
[facility] MD Progress Note  09/10/2017 2:56 PM Dustin [name]  MRN:  [id number] Subjective: Pt is outside this afternoon socializing with peers and smiling and laughing. He has been very calm on the unit. He is overall much more organized today. He only mentions having the pen in his teeth once in conversation and does not perseverate on this. He states that he feels calm and denies feeling agitated. He states that his back hurts from the bed. He feels he medications are helping him sleep. He states that he is studying the bible and trying to learn to "read from left to right." He is fully oriented to month, day, and president.   Principal Problem: Paranoid schizophrenia ([place]) Diagnosis:   Patient Active Problem List   Diagnosis Date Noted  . Paranoid schizophrenia ([place]) [F20.0] 09/03/2017    Priority: High  . Schizoaffective disorder, bipolar type ([place]) [F25.0] 01/24/2017    Priority: High  . Tardive dyskinesia [G24.01] 01/24/2017  . Tobacco use disorder [F17.200] 11/29/2016   Total Time spent with patient: 20 minutes  Past Psychiatric History: See H&P  Past Medical History:  Past Medical History:  Diagnosis Date  . Schizophrenia ([place])    History reviewed. No pertinent surgical history. Family History: History reviewed. No pertinent family history. Family Psychiatric  History: See H&P Social History:  Social History   Substance and Sexual Activity  Alcohol Use Yes  . Alcohol/week: 4.2 oz  . Types: 7 Shots of liquor per week   Comment: 1 drink every night- gin     Social History   Substance and Sexual Activity  Drug Use No    Social History   Socioeconomic History  . Marital status: Single    Spouse name: Not on file  . Number of children: Not on file  . Years of education: Not on file  . Highest education level: Not on file  Occupational History  . Not on file  Social Needs  . Financial resource strain: Not on file  . Food insecurity:    Worry: Not on file    Inability:  Not on file  . Transportation needs:    Medical: Not on file    Non-medical: Not on file  Tobacco Use  . Smoking status: Current Every Day Smoker    Packs/day: 0.50    Years: 15.00    Pack years: 7.50  . Smokeless tobacco: Never Used  Substance and Sexual Activity  . Alcohol use: Yes    Alcohol/week: 4.2 oz    Types: 7 Shots of liquor per week    Comment: 1 drink every night- gin  . Drug use: No  . Sexual activity: Never    Birth control/protection: None  Lifestyle  . Physical activity:    Days per week: Not on file    Minutes per session: Not on file  . Stress: Not on file  Relationships  . Social connections:    Talks on phone: Not on file    Gets together: Not on file    Attends religious service: Not on file    Active member of club or organization: Not on file    Attends meetings of clubs or organizations: Not on file    Relationship status: Not on file  Other Topics Concern  . Not on file  Social History Narrative  . Not on file   Additional Social History:  Sleep: Fair  Appetite:  Fair  Current Medications: Current Facility-Administered Medications  Medication Dose Route Frequency Provider Last Rate Last Dose  . acetaminophen (TYLENOL) tablet 650 mg  650 mg Oral Q6H PRN Pucilowska, Jolanta B, MD      . alum & mag hydroxide-simeth (MAALOX/MYLANTA) 200-200-20 MG/5ML suspension 30 mL  30 mL Oral Q4H PRN Pucilowska, Jolanta B, MD      . divalproex (DEPAKOTE) DR tablet 500 mg  500 mg Oral BH-q7a Yobani Schertzer, [name], MD   500 mg at 09/10/17 2979   And  . divalproex (DEPAKOTE) DR tablet 1,000 mg  1,000 mg Oral QHS Destany Severns R, MD   1,000 mg at 09/09/17 2123  . haloperidol (HALDOL) tablet 5 mg  5 mg Oral BID Marylin Crosby, MD   5 mg at 09/10/17 0908  . hydrOXYzine (ATARAX/VISTARIL) tablet 25 mg  25 mg Oral TID PRN Pucilowska, Jolanta B, MD      . magnesium hydroxide (MILK OF MAGNESIA) suspension 30 mL  30 mL Oral Daily PRN  Pucilowska, Jolanta B, MD      . multivitamin with minerals tablet 1 tablet  1 tablet Oral Daily Pucilowska, Jolanta B, MD   1 tablet at 09/10/17 0908  . nicotine (NICODERM CQ - dosed in mg/24 hours) patch 21 mg  21 mg Transdermal Daily Pucilowska, Jolanta B, MD   Stopped at 09/07/17 0855  . [START ON 09/25/2017] paliperidone (INVEGA SUSTENNA) injection 234 mg  234 mg Intramuscular Q28 days Rambo Sarafian, [name], MD      . traZODone (DESYREL) tablet 50 mg  50 mg Oral QHS PRN Marylin Crosby, MD   50 mg at 09/06/17 2113    Lab Results:  Results for orders placed or performed during the hospital encounter of 09/03/17 (from the past 48 hour(s))  Valproic acid level     Status: None   Collection Time: 09/09/17  7:25 AM  Result Value Ref Range   Valproic Acid Lvl 75 50.0 - 100.0 ug/mL    Comment: Performed at [facility], [address] [place], [place] [address]  Comprehensive metabolic panel     Status: Abnormal   Collection Time: 09/09/17  7:25 AM  Result Value Ref Range   Sodium 138 135 - 145 mmol/L   Potassium 4.3 3.5 - 5.1 mmol/L   Chloride 108 101 - 111 mmol/L   CO2 25 22 - 32 mmol/L   Glucose, Bld 134 (H) 65 - 99 mg/dL   BUN 19 6 - 20 mg/dL   Creatinine, Ser 1.47 (H) 0.61 - 1.24 mg/dL   Calcium 8.8 (L) 8.9 - 10.3 mg/dL   Total Protein 6.7 6.5 - 8.1 g/dL   Albumin 3.5 3.5 - 5.0 g/dL   AST 21 15 - 41 U/L   ALT 16 (L) 17 - 63 U/L   Alkaline Phosphatase 75 38 - 126 U/L   Total Bilirubin 0.5 0.3 - 1.2 mg/dL   GFR calc non Af Amer 50 (L) >60 mL/min   GFR calc Af Amer 58 (L) >60 mL/min    Comment: (NOTE) The eGFR has been calculated using the CKD EPI equation. This calculation has not been validated in all clinical situations. eGFR's persistently <60 mL/min signify possible Chronic Kidney Disease.    Anion gap 5 5 - 15    Comment: Performed at [facility], [address] [place], [place] [address]    Blood Alcohol level:  Lab Results  Component Value Date  ETH <10 08/31/2017   ETH <10 45/08/8880    Metabolic Disorder Labs: Lab Results  Component Value Date   HGBA1C 5.4 08/17/2017   MPG 108.28 08/17/2017   MPG 105.41 01/12/2017   No results found for: PROLACTIN Lab Results  Component Value Date   CHOL 131 08/17/2017   TRIG 111 08/17/2017   HDL 46 08/17/2017   CHOLHDL 2.8 08/17/2017   VLDL 22 08/17/2017   LDLCALC 63 08/17/2017   LDLCALC 88 01/12/2017    Physical Findings: AIMS: Facial and Oral Movements Muscles of Facial Expression: None, normal Lips and Perioral Area: None, normal Jaw: None, normal Tongue: None, normal,Extremity Movements Upper (arms, wrists, hands, fingers): None, normal Lower (legs, knees, ankles, toes): None, normal, Trunk Movements Neck, shoulders, hips: None, normal, Overall Severity Severity of abnormal movements (highest score from questions above): None, normal Incapacitation due to abnormal movements: None, normal Patient's awareness of abnormal movements (rate only patient's report): No Awareness, Dental Status Current problems with teeth and/or dentures?: No Does patient usually wear dentures?: No  CIWA:    COWS:     Musculoskeletal: Strength & Muscle Tone: within normal limits Gait & Station: normal Patient leans: N/A  Psychiatric Specialty Exam: Physical Exam  Nursing note and vitals reviewed.   Review of Systems  All other systems reviewed and are negative.   Blood pressure 101/63, pulse 98, temperature 98.6 F (37 C), temperature source Oral, resp. rate 16, height 6' (1.829 m), weight 72.6 kg (160 lb 0.9 oz), SpO2 100 %.Body mass index is 21.71 kg/m.  General Appearance: Casual  Eye Contact:  Good  Speech:  Clear and Coherent  Volume:  Normal  Mood:  Euthymic  Affect:  Appropriate  Thought Process:  Coherent, more organized today  Orientation:  Full (Time, Place, and Person)  Thought Content:  Delusions but improving  Suicidal Thoughts:  No  Homicidal Thoughts:  No   Memory:  Immediate;   Fair  Judgement:  Fair  Insight:  Lacking  Psychomotor Activity:  Normal  Concentration:  Concentration: Fair  Recall:  [employer] of Knowledge:  Fair  Language:  Fair  Akathisia:  No      Assets:  Resilience  ADL's:  Intact  Cognition:  WNL  Sleep:  Number of Hours: 7.45     Treatment Plan Summary: 62 yo male admitted due to psychosis. HE is more organized today. He has been very calm on the unit with no outburst or agitation at all.   Plan:  Schizoaffective disorder -He is on [place]. Next dose due 09/25/17 -Continue oral Haldol 41m BID with a plan to transition to Haldol decanoate. -Continue Depakote 500 mg qama nd 1000 mg qhs. VPA 75. LFTS normal  Dispo -He will return to his brother's on discharge. [facility]  HMarylin Crosby MD 09/10/2017, 2:56 PM

## 2017-09-10 NOTE — Progress Notes (Signed)
Recreation Therapy Notes   Date: 09/10/2017  Time: 9:30 am  Location: Craft Room  Behavioral response: N/A  Intervention Topic: Creative Expression  Discussion/Intervention: Patient did not attend group.  Clinical Observations/Feedback:  Patient did not attend group.  Conchita Truxillo LRT/CTRS           Dustin [name] 09/10/2017 12:43 PM 

## 2017-09-11 NOTE — Plan of Care (Signed)
  Problem: Education: Goal: Knowledge of [facility] Education information/materials will improve Outcome: Progressing Goal: Emotional status will improve Outcome: Progressing Goal: Mental status will improve Outcome: Progressing   Problem: Education: Goal: Emotional status will improve Outcome: Progressing   Problem: Education: Goal: Mental status will improve Outcome: Progressing   Problem: Activity: Goal: Interest or engagement in activities will improve Outcome: Progressing   Problem: Activity: Goal: Interest or engagement in activities will improve Outcome: Progressing   Problem: Coping: Goal: Ability to identify and develop effective coping behavior will improve Outcome: Progressing   Problem: Self-Concept: Goal: Will verbalize positive feelings about self Outcome: Progressing

## 2017-09-11 NOTE — BHH Group Notes (Signed)
09/11/2017 1PM  Type of Therapy/Topic:  Group Therapy:  Feelings about Diagnosis  Participation Level:  Did Not Attend   Description of Group:   This group will allow patients to explore their thoughts and feelings about diagnoses they have received. Patients will be guided to explore their level of understanding and acceptance of these diagnoses. Facilitator will encourage patients to process their thoughts and feelings about the reactions of others to their diagnosis and will guide patients in identifying ways to discuss their diagnosis with significant others in their lives. This group will be process-oriented, with patients participating in exploration of their own experiences, giving and receiving support, and processing challenge from other group members.   Therapeutic Goals: 1. Patient will demonstrate understanding of diagnosis as evidenced by identifying two or more symptoms of the disorder 2. Patient will be able to express two feelings regarding the diagnosis 3. Patient will demonstrate their ability to communicate their needs through discussion and/or role play  Summary of Patient Progress: Patient was encouraged and invited to attend group. Patient did not attend group. Social worker will continue to encourage group participation in the future.        Therapeutic Modalities:   Cognitive Behavioral Therapy Brief Therapy Feelings Identification    Darin Engels, LCSW 09/11/2017 1:53 PM

## 2017-09-11 NOTE — Progress Notes (Signed)
D: Pt is  alert and oriented x 4, no distress noted, he denies SI/HI/AVH, affect is pleasant and cooperative. Patient's thoughts are organized  he appears less anxious and he is interacting with peers and staff appropriately.  A: Pt was offered support and encouraged to attend evening wrap up group. .15 minutes safety checks maintained for safety.  R:Pt did not attends evening group, patient is compliant with medication. Safety maintained on unit, will continue to monitor.

## 2017-09-11 NOTE — BHH Group Notes (Signed)
  09/11/2017  Time: 0900  Type of Therapy and Topic: Group Therapy: Goals Group: SMART Goals   Participation Level:  Did Not Attend   Description of Group:   The purpose of a daily goals group is to assist and guide patients in setting recovery/wellness-related goals. The objective is to set goals as they relate to the crisis in which they were admitted. Patients will be using SMART goal modalities to set measurable goals. Characteristics of realistic goals will be discussed and patients will be assisted in setting and processing how one will reach their goal. Facilitator will also assist patients in applying interventions and coping skills learned in psycho-education groups to the SMART goal and process how one will achieve defined goal.   Therapeutic Goals:  -Patients will develop and document one goal related to or their crisis in which brought them into treatment.  -Patients will be guided by LCSW using SMART goal setting modality in how to set a measurable, attainable, realistic and time sensitive goal.  -Patients will process barriers in reaching goal.  -Patients will process interventions in how to overcome and successful in reaching goal.   Patient's Goal:  Pt was invited to attend group but chose not to attend. CSW will continue to encourage pt to attend group throughout their admission.    Therapeutic Modalities:  Motivational Interviewing  Cognitive Behavioral Therapy  Crisis Intervention Model  SMART goals setting  Alden Hipp, MSW, LCSW Clinical Social Worker 09/11/2017 9:39 AM

## 2017-09-11 NOTE — Plan of Care (Signed)
Patient is alert and oriented to self and place; denies SI and HI but expresses positive for hearing voices. Patient states he hears God. Patient's affect is pleasant, and flirtatious. Patient has some disorganized thinking but not as much as a few days ago; and it can be hard to understand him due to slurred speech. Patient is engaging in activities and will talk with staff about his feelings. Patient states he does not like anxious people or the word fear. Patient has no complaints at this time. Patient is eating meals adequately and sleeping well throughout the night. Nurse will continue to monitor. Safety checks will continue Q 15 minutes. Problem: Activity: Goal: Interest or engagement in activities will improve 09/11/2017 1114 by Geraldo Docker, RN Outcome: Progressing 09/11/2017 1114 by Geraldo Docker, RN Outcome: Progressing   Problem: Coping: Goal: Ability to identify and develop effective coping behavior will improve 09/11/2017 1114 by Geraldo Docker, RN Outcome: Progressing 09/11/2017 1114 by Geraldo Docker, RN Outcome: Progressing   Problem: Coping: Goal: Level of anxiety will decrease 09/11/2017 1114 by Geraldo Docker, RN Outcome: Progressing 09/11/2017 1114 by Geraldo Docker, RN Outcome: Progressing   Problem: Self-Concept: Goal: Will verbalize positive feelings about self 09/11/2017 1114 by Geraldo Docker, RN Outcome: Progressing 09/11/2017 1114 by Geraldo Docker, RN Outcome: Not Progressing   Problem: Elimination: Goal: Will not experience complications related to bowel motility 09/11/2017 1114 by Geraldo Docker, RN Outcome: Progressing 09/11/2017 1114 by Geraldo Docker, RN Outcome: Progressing
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
## 2017-09-11 NOTE — Progress Notes (Signed)
Recreation Therapy Notes  Date: 09/11/2017  Time: 9:30 am   Location: Craft Room   Behavioral response: N/A   Intervention Topic: Goals   Discussion/Intervention: Patient did not attend group.   Clinical Observations/Feedback:  Patient did not attend group.   Jamisen Duerson LRT/CTRS        Velita Quirk 09/11/2017 10:59 AM 

## 2017-09-11 NOTE — BHH Group Notes (Signed)
[place] Group Notes:  (Nursing/MHT/Case Management/Adjunct)  Date:  09/11/2017  Time:  9:18 PM  Type of Therapy:  Group Therapy  Participation Level:  Active  Participation Quality:  Appropriate  Affect:  Appropriate  Cognitive:  Alert  Insight:  Good  Engagement in Group:  Engaged  Modes of Intervention:  Support  Summary of Progress/Problems:  Dustin [name] 09/11/2017, 9:18 PM

## 2017-09-12 NOTE — Progress Notes (Signed)
[facility] MD Progress Note  09/12/2017 12:34 PM Dustin [name]  MRN:  [id number] Subjective:  Pt states that he is feeling well. He is in his room reading the bible. HE states that he loves studying the brain and the bible. He does not perseverate on religious statements. He has been very calm and pleasant on the unit with no anger outbursts. He is sleeping well and hygiene is good. HE is more organized today and did not have any delusional thoughts content today. He has been compliant with medications and is willing to get the Haldol injection.   Principal Problem: Paranoid schizophrenia ([place]) Diagnosis:   Patient Active Problem List   Diagnosis Date Noted  . Paranoid schizophrenia ([place]) [F20.0] 09/03/2017    Priority: High  . Schizoaffective disorder, bipolar type ([place]) [F25.0] 01/24/2017    Priority: High  . Tardive dyskinesia [G24.01] 01/24/2017  . Tobacco use disorder [F17.200] 11/29/2016   Total Time spent with patient: 20 minutes  Past Psychiatric History: See H&P  Past Medical History:  Past Medical History:  Diagnosis Date  . Schizophrenia ([place])    History reviewed. No pertinent surgical history. Family History: History reviewed. No pertinent family history. Family Psychiatric  History: See h&P Social History:  Social History   Substance and Sexual Activity  Alcohol Use Yes  . Alcohol/week: 4.2 oz  . Types: 7 Shots of liquor per week   Comment: 1 drink every night- gin     Social History   Substance and Sexual Activity  Drug Use No    Social History   Socioeconomic History  . Marital status: Single    Spouse name: Not on file  . Number of children: Not on file  . Years of education: Not on file  . Highest education level: Not on file  Occupational History  . Not on file  Social Needs  . Financial resource strain: Not on file  . Food insecurity:    Worry: Not on file    Inability: Not on file  . Transportation needs:    Medical: Not on file    Non-medical:  Not on file  Tobacco Use  . Smoking status: Current Every Day Smoker    Packs/day: 0.50    Years: 15.00    Pack years: 7.50  . Smokeless tobacco: Never Used  Substance and Sexual Activity  . Alcohol use: Yes    Alcohol/week: 4.2 oz    Types: 7 Shots of liquor per week    Comment: 1 drink every night- gin  . Drug use: No  . Sexual activity: Never    Birth control/protection: None  Lifestyle  . Physical activity:    Days per week: Not on file    Minutes per session: Not on file  . Stress: Not on file  Relationships  . Social connections:    Talks on phone: Not on file    Gets together: Not on file    Attends religious service: Not on file    Active member of club or organization: Not on file    Attends meetings of clubs or organizations: Not on file    Relationship status: Not on file  Other Topics Concern  . Not on file  Social History Narrative  . Not on file   Additional Social History:                         Sleep: Good  Appetite:  Fair  Current Medications:  Current Facility-Administered Medications  Medication Dose Route Frequency Provider Last Rate Last Dose  . acetaminophen (TYLENOL) tablet 650 mg  650 mg Oral Q6H PRN Pucilowska, Jolanta B, MD   650 mg at 09/12/17 0930  . alum & mag hydroxide-simeth (MAALOX/MYLANTA) 200-200-20 MG/5ML suspension 30 mL  30 mL Oral Q4H PRN Pucilowska, Jolanta B, MD      . divalproex (DEPAKOTE) DR tablet 500 mg  500 mg Oral BH-q7a Tag Wurtz R, MD   500 mg at 09/12/17 4268   And  . divalproex (DEPAKOTE) DR tablet 1,000 mg  1,000 mg Oral QHS Admire Bunnell R, MD   1,000 mg at 09/11/17 2123  . haloperidol (HALDOL) tablet 5 mg  5 mg Oral BID Nitza Schmid, [name], MD   5 mg at 09/12/17 0926  . hydrOXYzine (ATARAX/VISTARIL) tablet 25 mg  25 mg Oral TID PRN Orson Slick B, MD   25 mg at 09/10/17 2109  . magnesium hydroxide (MILK OF MAGNESIA) suspension 30 mL  30 mL Oral Daily PRN Pucilowska, Jolanta B, MD      . multivitamin  with minerals tablet 1 tablet  1 tablet Oral Daily Pucilowska, Jolanta B, MD   1 tablet at 09/12/17 0926  . nicotine (NICODERM CQ - dosed in mg/24 hours) patch 21 mg  21 mg Transdermal Daily Pucilowska, Jolanta B, MD   Stopped at 09/07/17 0855  . [START ON 09/25/2017] paliperidone (INVEGA SUSTENNA) injection 234 mg  234 mg Intramuscular Q28 days Dezaree Tracey, [name], MD      . traZODone (DESYREL) tablet 50 mg  50 mg Oral QHS PRN Marylin Crosby, MD   50 mg at 09/10/17 2109    Lab Results: No results found for this or any previous visit (from the past 48 hour(s)).  Blood Alcohol level:  Lab Results  Component Value Date   ETH <10 08/31/2017   ETH <10 34/19/6222    Metabolic Disorder Labs: Lab Results  Component Value Date   HGBA1C 5.4 08/17/2017   MPG 108.28 08/17/2017   MPG 105.41 01/12/2017   No results found for: PROLACTIN Lab Results  Component Value Date   CHOL 131 08/17/2017   TRIG 111 08/17/2017   HDL 46 08/17/2017   CHOLHDL 2.8 08/17/2017   VLDL 22 08/17/2017   LDLCALC 63 08/17/2017   LDLCALC 88 01/12/2017    Physical Findings: AIMS: Facial and Oral Movements Muscles of Facial Expression: None, normal Lips and Perioral Area: None, normal Jaw: None, normal Tongue: None, normal,Extremity Movements Upper (arms, wrists, hands, fingers): None, normal Lower (legs, knees, ankles, toes): None, normal, Trunk Movements Neck, shoulders, hips: None, normal, Overall Severity Severity of abnormal movements (highest score from questions above): None, normal Incapacitation due to abnormal movements: None, normal Patient's awareness of abnormal movements (rate only patient's report): No Awareness, Dental Status Current problems with teeth and/or dentures?: No Does patient usually wear dentures?: No  CIWA:    COWS:     Musculoskeletal: Strength & Muscle Tone: within normal limits Gait & Station: normal Patient leans: N/A  Psychiatric Specialty Exam: Physical Exam  ROS  Blood  pressure (!) 124/105, pulse 91, temperature 97.7 F (36.5 C), temperature source Oral, resp. rate 18, height 6' (1.829 m), weight 72.6 kg (160 lb 0.9 oz), SpO2 100 %.Body mass index is 21.71 kg/m.  General Appearance: Casual  Eye Contact:  Good  Speech:  Clear and Coherent  Volume:  Decreased  Mood:  Euthymic  Affect:  Appropriate  Thought Process:  Coherent, more organized today  Orientation:  Full (Time, Place, and Person)  Thought Content:  Logical  Suicidal Thoughts:  No  Homicidal Thoughts:  No  Memory:  Immediate;   Good  Judgement:  Impaired  Insight:  Lacking  Psychomotor Activity:  Normal  Concentration:  Concentration: Fair  Recall:  [employer] of Knowledge:  Fair  Language:  Fair  Akathisia:  No      Assets:  Resilience  ADL's:  Intact  Cognition:  WNL  Sleep:  Number of Hours: 5.75     Treatment Plan Summary: 62 yo male admitted due to decompensation and psychosis. Thought processes continue to improve. He did not have any delusional thought content today. Speech is much more clear.  Plan:  Schizoaffective disorder -Continue oral Haldol 5 mg BID. Will transition to Haldol Decanoate 100 mg monthly. Key will be to give injection closer to time that Kirt Boys is due as he often refuses more than one injection a month. HE quickly decompensates and becomes very agitated when off medications. BP remains stable. He will get haldol injection next week -Continue Depakote 500 mg qam and 1000 mg qhs. Level 75  Dispo -[facility] and follow up with [facility] ACT team Marylin Crosby, MD 09/12/2017, 12:34 PM

## 2017-09-12 NOTE — Plan of Care (Signed)
Patient verbalizes understanding of the general information that has been provided to him and has not voiced any further questions or concerns at this time. Patient denies SI/HI/AVH as well as depression/anxiety at this time. Patient has the ability to identify effective coping behavior and has verbalized positive feelings about himself stating to this writer that his goal for today is "hoping to get my shot so I can get up out of here". Patient has not experienced any health related complications thus far. Patient has remained free from injury thus far and remains safe on the unit at this time.  Problem: Education: Goal: Knowledge of [facility] Education information/materials will improve Outcome: Progressing Goal: Emotional status will improve Outcome: Progressing Goal: Mental status will improve Outcome: Progressing   Problem: Activity: Goal: Interest or engagement in activities will improve Outcome: Progressing   Problem: Coping: Goal: Ability to identify and develop effective coping behavior will improve Outcome: Progressing   Problem: Self-Concept: Goal: Will verbalize positive feelings about self Outcome: Progressing   Problem: Coping: Goal: Level of anxiety will decrease Outcome: Progressing   Problem: Elimination: Goal: Will not experience complications related to bowel motility Outcome: Progressing

## 2017-09-12 NOTE — Progress Notes (Signed)
Patient was complaint with group  Therapy. Denied any acute pain. Room free from any safety hazard. Denied SI/HI/AVH. Will continue every 15 minutes checks.

## 2017-09-12 NOTE — Progress Notes (Signed)
Patient slept for 7 hours this shift.

## 2017-09-12 NOTE — BHH Group Notes (Signed)
LCSW Group Therapy Note  09/12/2017 1:00pm  Type of Therapy/Topic:  Group Therapy:  Emotion Regulation  Participation Level:  Minimal   Description of Group:   The purpose of this group is to assist patients in learning to regulate negative emotions and experience positive emotions. Patients will be guided to discuss ways in which they have been vulnerable to their negative emotions. These vulnerabilities will be juxtaposed with experiences of positive emotions or situations, and patients will be challenged to use positive emotions to combat negative ones. Special emphasis will be placed on coping with negative emotions in conflict situations, and patients will process healthy conflict resolution skills.  Therapeutic Goals: 1. Patient will identify two positive emotions or experiences to reflect on in order to balance out negative emotions 2. Patient will label two or more emotions that they find the most difficult to experience 3. Patient will demonstrate positive conflict resolution skills through discussion and/or role plays  Summary of Patient Progress: Pt minimal participation, did check in and said he was having an okay day.  Didn't contribute anything else.    Therapeutic Modalities:   Cognitive Behavioral Therapy Feelings Identification Dialectical Behavioral Therapy   August Saucer, LCSW 09/12/2017 5:20 PM

## 2017-09-12 NOTE — Progress Notes (Signed)
D- Patient alert and oriented. Patient presents in a pleasant mood stating that he slept pretty good last night. Patient states to this writer "this medicine is making me lazy". Patient states to this writer that he has pain in his lower back "it's like a pulling sensation". Patient requested pain medication from this writer. Patient denies SI, HI, AVH, at this time. Patient's goal for today is "hoping to get my shot so I can get out of here".   A- Scheduled medications administered to patient, per MD orders. Support and encouragement provided.  Routine safety checks conducted every 15 minutes.  Patient informed to notify staff with problems or concerns.  R- No adverse drug reactions noted. Patient contracts for safety at this time. Patient compliant with medications and treatment plan. Patient receptive, calm, and cooperative. Patient interacts well with others on the unit.  Patient remains safe at this time.

## 2017-09-13 NOTE — Progress Notes (Signed)
[facility] MD Progress Note  09/13/2017 1:47 PM Dustin [name]  MRN:  [id number] Subjective:  Pt slightly more irritable today, especially when his Act team is brought up. HE states, "people are trying to control me" meaning the ACT team. He states, "I'm just trying to get some rest." He has been veray calm on the unit and observed out of his room interacting well with peers. He reports sleeping well and eating well.   Principal Problem: Paranoid schizophrenia ([place]) Diagnosis:   Patient Active Problem List   Diagnosis Date Noted  . Paranoid schizophrenia ([place]) [F20.0] 09/03/2017    Priority: High  . Schizoaffective disorder, bipolar type ([place]) [F25.0] 01/24/2017    Priority: High  . Tardive dyskinesia [G24.01] 01/24/2017  . Tobacco use disorder [F17.200] 11/29/2016   Total Time spent with patient: 15 minutes  Past Psychiatric History: See H&P  Past Medical History:  Past Medical History:  Diagnosis Date  . Schizophrenia ([place])    History reviewed. No pertinent surgical history. Family History: History reviewed. No pertinent family history. Family Psychiatric  History: See H&P Social History:  Social History   Substance and Sexual Activity  Alcohol Use Yes  . Alcohol/week: 4.2 oz  . Types: 7 Shots of liquor per week   Comment: 1 drink every night- gin     Social History   Substance and Sexual Activity  Drug Use No    Social History   Socioeconomic History  . Marital status: Single    Spouse name: Not on file  . Number of children: Not on file  . Years of education: Not on file  . Highest education level: Not on file  Occupational History  . Not on file  Social Needs  . Financial resource strain: Not on file  . Food insecurity:    Worry: Not on file    Inability: Not on file  . Transportation needs:    Medical: Not on file    Non-medical: Not on file  Tobacco Use  . Smoking status: Current Every Day Smoker    Packs/day: 0.50    Years: 15.00    Pack years: 7.50  .  Smokeless tobacco: Never Used  Substance and Sexual Activity  . Alcohol use: Yes    Alcohol/week: 4.2 oz    Types: 7 Shots of liquor per week    Comment: 1 drink every night- gin  . Drug use: No  . Sexual activity: Never    Birth control/protection: None  Lifestyle  . Physical activity:    Days per week: Not on file    Minutes per session: Not on file  . Stress: Not on file  Relationships  . Social connections:    Talks on phone: Not on file    Gets together: Not on file    Attends religious service: Not on file    Active member of club or organization: Not on file    Attends meetings of clubs or organizations: Not on file    Relationship status: Not on file  Other Topics Concern  . Not on file  Social History Narrative  . Not on file   Additional Social History:                         Sleep: Good  Appetite:  Good  Current Medications: Current Facility-Administered Medications  Medication Dose Route Frequency Provider Last Rate Last Dose  . acetaminophen (TYLENOL) tablet 650 mg  650 mg Oral  Q6H PRN Pucilowska, Jolanta B, MD   650 mg at 09/12/17 0930  . alum & mag hydroxide-simeth (MAALOX/MYLANTA) 200-200-20 MG/5ML suspension 30 mL  30 mL Oral Q4H PRN Pucilowska, Jolanta B, MD      . divalproex (DEPAKOTE) DR tablet 500 mg  500 mg Oral BH-q7a Sydne Krahl, [name], MD   500 mg at 09/13/17 0901   And  . divalproex (DEPAKOTE) DR tablet 1,000 mg  1,000 mg Oral QHS Dayven Linsley R, MD   1,000 mg at 09/12/17 2133  . haloperidol (HALDOL) tablet 5 mg  5 mg Oral BID Marylin Crosby, MD   5 mg at 09/13/17 0902  . hydrOXYzine (ATARAX/VISTARIL) tablet 25 mg  25 mg Oral TID PRN Pucilowska, Jolanta B, MD   25 mg at 09/12/17 2133  . magnesium hydroxide (MILK OF MAGNESIA) suspension 30 mL  30 mL Oral Daily PRN Pucilowska, Jolanta B, MD      . multivitamin with minerals tablet 1 tablet  1 tablet Oral Daily Pucilowska, Jolanta B, MD   1 tablet at 09/13/17 0901  . nicotine (NICODERM CQ -  dosed in mg/24 hours) patch 21 mg  21 mg Transdermal Daily Pucilowska, Jolanta B, MD   Stopped at 09/07/17 0855  . [START ON 09/25/2017] paliperidone (INVEGA SUSTENNA) injection 234 mg  234 mg Intramuscular Q28 days Angelicia Lessner, [name], MD      . traZODone (DESYREL) tablet 50 mg  50 mg Oral QHS PRN Marylin Crosby, MD   50 mg at 09/12/17 2133    Lab Results: No results found for this or any previous visit (from the past 48 hour(s)).  Blood Alcohol level:  Lab Results  Component Value Date   ETH <10 08/31/2017   ETH <10 32/67/1245    Metabolic Disorder Labs: Lab Results  Component Value Date   HGBA1C 5.4 08/17/2017   MPG 108.28 08/17/2017   MPG 105.41 01/12/2017   No results found for: PROLACTIN Lab Results  Component Value Date   CHOL 131 08/17/2017   TRIG 111 08/17/2017   HDL 46 08/17/2017   CHOLHDL 2.8 08/17/2017   VLDL 22 08/17/2017   LDLCALC 63 08/17/2017   LDLCALC 88 01/12/2017    Physical Findings: AIMS: Facial and Oral Movements Muscles of Facial Expression: None, normal Lips and Perioral Area: None, normal Jaw: None, normal Tongue: None, normal,Extremity Movements Upper (arms, wrists, hands, fingers): None, normal Lower (legs, knees, ankles, toes): None, normal, Trunk Movements Neck, shoulders, hips: None, normal, Overall Severity Severity of abnormal movements (highest score from questions above): None, normal Incapacitation due to abnormal movements: None, normal Patient's awareness of abnormal movements (rate only patient's report): No Awareness, Dental Status Current problems with teeth and/or dentures?: No Does patient usually wear dentures?: No  CIWA:    COWS:     Musculoskeletal: Strength & Muscle Tone: within normal limits Gait & Station: normal Patient leans: N/A  Psychiatric Specialty Exam: Physical Exam  Nursing note and vitals reviewed.   Review of Systems  All other systems reviewed and are negative.   Blood pressure 130/86, pulse 96,  temperature 98.6 F (37 C), temperature source Oral, resp. rate 18, height 6' (1.829 m), weight 72.6 kg (160 lb 0.9 oz), SpO2 100 %.Body mass index is 21.71 kg/m.  General Appearance: Casual  Eye Contact:  Minimal  Speech:  Clear and Coherent  Volume:  Normal  Mood:  Euthymic  Affect:  Congruent  Thought Process:  Coherent and Goal Directed  Orientation:  Full (Time, Place, and Person)  Thought Content:  Logical  Suicidal Thoughts:  No  Homicidal Thoughts:  No  Memory:  Immediate;   Fair  Judgement:  Impaired  Insight:  Lacking  Psychomotor Activity:  NA  Concentration:  Concentration: Fair  Recall:  [employer] of Knowledge:  Fair  Language:  Fair  Akathisia:  No      Assets:  Resilience  ADL's:  Intact  Cognition:  WNL  Sleep:  Number of Hours: 7     Treatment Plan Summary: 62 yo male admitted due to decompensation and psychosis. He is a bit irritable today but overall more organized in thoughts and has not been agitated on the unit.  Plan:  Schizoaffective disorder -Continue oral Haldol 5 mg BID. Will transition to Haldol Decanoate 100 mg monthly. Key will be to give injection closer to time that Kirt Boys is due as he often refuses more than one injection a month. HE quickly decompensates and becomes very agitated when off medications. BP remains stable. He will get haldol injection next week -Continue Depakote 500 mg qam and 1000 mg qhs. Level 75  Dispo -[facility] and [facility] with [facility] ACT team   Marylin Crosby, MD 09/13/2017, 1:47 PM

## 2017-09-13 NOTE — BHH Group Notes (Signed)
09/13/2017  Time: 1PM   Type of Therapy/Topic:  Group Therapy:  Balance in Life  Participation Level:  Did Not Attend  Description of Group:   This group will address the concept of balance and how it feels and looks when one is unbalanced. Patients will be encouraged to process areas in their lives that are out of balance and identify reasons for remaining unbalanced. Facilitators will guide patients in utilizing problem-solving interventions to address and correct the stressor making their life unbalanced. Understanding and applying boundaries will be explored and addressed for obtaining and maintaining a balanced life. Patients will be encouraged to explore ways to assertively make their unbalanced needs known to significant others in their lives, using other group members and facilitator for support and feedback.  Therapeutic Goals: 1. Patient will identify two or more emotions or situations they have that consume much of in their lives. 2. Patient will identify signs/triggers that life has become out of balance:  3. Patient will identify two ways to set boundaries in order to achieve balance in their lives:  4. Patient will demonstrate ability to communicate their needs through discussion and/or role plays  Summary of Patient Progress: Pt was invited to attend group but chose not to attend. CSW will continue to encourage pt to attend group throughout their admission.    Therapeutic Modalities:   Cognitive Behavioral Therapy Solution-Focused Therapy Assertiveness Training  Vassie Kugel, MSW, LCSW Clinical Social Worker 09/13/2017 1:56 PM  

## 2017-09-13 NOTE — BHH Group Notes (Signed)
LCSW Group Therapy Note 09/13/2017 9:00 AM  Type of Therapy and Topic:  Group Therapy:  Setting Goals  Participation Level:  Did Not Attend  Description of Group: In this process group, patients discussed using strengths to work toward goals and address challenges.  Patients identified two positive things about themselves and one goal they were working on.  Patients were given the opportunity to share openly and support each other's plan for self-empowerment.  The group discussed the value of gratitude and were encouraged to have a daily reflection of positive characteristics or circumstances.  Patients were encouraged to identify a plan to utilize their strengths to work on current challenges and goals.  Therapeutic Goals 1. Patient will verbalize personal strengths/positive qualities and relate how these can assist with achieving desired personal goals 2. Patients will verbalize affirmation of peers plans for personal change and goal setting 3. Patients will explore the value of gratitude and positive focus as related to successful achievement of goals 4. Patients will verbalize a plan for regular reinforcement of personal positive qualities and circumstances.  Summary of Patient Progress:  [place] was invited to today's group, but chose not to attend.     Therapeutic Modalities Cognitive Behavioral Therapy Motivational Interviewing    Devona Konig, [place] 09/13/2017 2:09 PM

## 2017-09-13 NOTE — BHH Group Notes (Signed)
[place] Group Notes:  (Nursing/MHT/Case Management/Adjunct)  Date:  09/13/2017  Time:  9:53 PM  Type of Therapy:  Group Therapy  Participation Level:  Active  Participation Quality:  Appropriate  Affect:  Appropriate  Cognitive:  Alert  Insight:  Good  Engagement in Group:  Engaged  Modes of Intervention:  Support  Summary of Progress/Problems:  Dustin [name] 09/13/2017, 9:53 PM

## 2017-09-13 NOTE — Plan of Care (Signed)
  Problem: Education: Goal: Knowledge of [facility] Education information/materials will improve Outcome: Progressing   Problem: Education: Goal: Mental status will improve Outcome: Progressing   Problem: Activity: Goal: Interest or engagement in activities will improve Outcome: Progressing

## 2017-09-13 NOTE — Progress Notes (Signed)
Recreation Therapy Notes  Date: 09/13/2017  Time: 9:30 am   Location: Craft Room   Behavioral response: N/A   Intervention Topic: Self-care   Discussion/Intervention: Patient did not attend group.   Clinical Observations/Feedback:  Patient did not attend group.          Kenton Fortin 09/13/2017 11:39 AM 

## 2017-09-13 NOTE — Progress Notes (Signed)
Received Dustin [name] this AM after breakfast, he was compliant with his medications and denied all of the psychiatric symptoms except for feeling anxious related to  his discharge date. He has been mostly quiet today and napping in his bed. He was medicated x1 for lower back pain and rated it 5/10. He is OOB in the day room after dinner and socializing with select peers.

## 2017-09-14 NOTE — Plan of Care (Signed)
Patient pleasant and cooperative. Patient is not experiencing SI/HI/AVH. Patient states he does not have a goal for the day, he is just sleepy. Compliant with medications. Support and encouragement provided. Safety maintained with 15 minute checks.   Problem: Education: Goal: Knowledge of [facility] Education information/materials will improve Outcome: Progressing Goal: Emotional status will improve Outcome: Progressing Goal: Mental status will improve Outcome: Progressing   Problem: Activity: Goal: Interest or engagement in activities will improve Outcome: Progressing   Problem: Coping: Goal: Ability to identify and develop effective coping behavior will improve Outcome: Progressing   Problem: Self-Concept: Goal: Will verbalize positive feelings about self Outcome: Progressing   Problem: Coping: Goal: Level of anxiety will decrease Outcome: Progressing   Problem: Elimination: Goal: Will not experience complications related to bowel motility Outcome: Progressing

## 2017-09-14 NOTE — [facility] Group Notes (Signed)

## 2017-09-14 NOTE — Progress Notes (Signed)
Recreation Therapy Notes   Date: 09/14/2017  Time: 9:30 am   Location: Craft Room   Behavioral response: N/A   Intervention Topic: Stress   Discussion/Intervention: Patient did not attend group.   Clinical Observations/Feedback:  Patient did not attend group.   Nelli Swalley LRT/CTRS        Dustin [name] 09/14/2017 12:22 PM

## 2017-09-14 NOTE — Tx Team (Signed)
Interdisciplinary Treatment and Diagnostic Plan Update  09/14/2017 Time of Session: 11am Dustin [name] MRN: [id number]  Principal Diagnosis: Paranoid schizophrenia [facility])  Secondary Diagnoses: Principal Problem:   Paranoid schizophrenia ([place])   Current Medications:  Current Facility-Administered Medications  Medication Dose Route Frequency Provider Last Rate Last Dose  . acetaminophen (TYLENOL) tablet 650 mg  650 mg Oral Q6H PRN Pucilowska, Jolanta B, MD   650 mg at 09/13/17 2136  . alum & mag hydroxide-simeth (MAALOX/MYLANTA) 200-200-20 MG/5ML suspension 30 mL  30 mL Oral Q4H PRN Pucilowska, Jolanta B, MD      . divalproex (DEPAKOTE) DR tablet 500 mg  500 mg Oral BH-q7a [name], [name], MD   500 mg at 09/14/17 3154   And  . divalproex (DEPAKOTE) DR tablet 1,000 mg  1,000 mg Oral QHS [name], [name], MD   1,000 mg at 09/13/17 2135  . haloperidol (HALDOL) tablet 5 mg  5 mg Oral BID [name], [name], MD   5 mg at 09/14/17 0839  . hydrOXYzine (ATARAX/VISTARIL) tablet 25 mg  25 mg Oral TID PRN Orson Slick B, MD   25 mg at 09/12/17 2133  . magnesium hydroxide (MILK OF MAGNESIA) suspension 30 mL  30 mL Oral Daily PRN Pucilowska, Jolanta B, MD      . multivitamin with minerals tablet 1 tablet  1 tablet Oral Daily Pucilowska, Jolanta B, MD   1 tablet at 09/14/17 0839  . nicotine (NICODERM CQ - dosed in mg/24 hours) patch 21 mg  21 mg Transdermal Daily Pucilowska, Jolanta B, MD   Stopped at 09/07/17 0855  . [START ON 09/25/2017] paliperidone (INVEGA SUSTENNA) injection 234 mg  234 mg Intramuscular Q28 days [name], [name], MD      . traZODone (DESYREL) tablet 50 mg  50 mg Oral QHS PRN Marylin Crosby, MD   50 mg at 09/12/17 2133   PTA Medications: Medications Prior to Admission  Medication Sig Dispense Refill Last Dose  . divalproex (DEPAKOTE) 500 MG DR tablet Take 1 tablet (500 mg total) by mouth every 12 (twelve) hours. (Patient not taking: Reported on 09/01/2017) 60 tablet 0 Not Taking at  Unknown time  . Multiple Vitamin (MULTIVITAMIN) tablet Take 1 tablet by mouth daily.   Past Week at Unknown time  . Omega-3 Fatty Acids (OMEGA-3 FISH OIL PO) Take 1 capsule by mouth daily.   Past Week at Unknown time  . [START ON 09/25/2017] paliperidone (INVEGA SUSTENNA) 234 MG/1.5ML SUSP injection Inject 234 mg into the muscle once for 1 dose. (Patient not taking: Reported on 09/01/2017) 1.5 mL 0 Not Taking at Unknown time    Patient Stressors: Financial difficulties Marital or family conflict Medication change or noncompliance  Patient Strengths: Active sense of humor Communication skills Supportive family/friends  Treatment Modalities: Medication Management, Group therapy, Case management,  1 to 1 session with clinician, Psychoeducation, Recreational therapy.   Physician Treatment Plan for Primary Diagnosis: Paranoid schizophrenia ([name]) Long Term Goal(s): Improvement in symptoms so as ready for discharge   Short Term Goals: Ability to verbalize feelings will improve  Medication Management: Evaluate patient's response, side effects, and tolerance of medication regimen.  Therapeutic Interventions: 1 to 1 sessions, Unit Group sessions and Medication administration.  Evaluation of Outcomes: Progressing  Physician Treatment Plan for Secondary Diagnosis: Principal Problem:   Paranoid schizophrenia ([name])  Long Term Goal(s): Improvement in symptoms so as ready for discharge   Short Term Goals: Ability to verbalize feelings will improve  Medication Management: Evaluate patient's response, side effects, and tolerance of medication regimen.  Therapeutic Interventions: 1 to 1 sessions, Unit Group sessions and Medication administration.  Evaluation of Outcomes: Progressing   RN Treatment Plan for Primary Diagnosis: Paranoid schizophrenia ([name]) Long Term Goal(s): Knowledge of disease and therapeutic regimen to maintain health will improve  Short Term Goals: Ability to demonstrate  self-control, Ability to participate in decision making will improve, Ability to identify and develop effective coping behaviors will improve and Compliance with prescribed medications will improve  Medication Management: RN will administer medications as ordered by provider, will assess and evaluate patient's response and provide education to patient for prescribed medication. RN will report any adverse and/or side effects to prescribing provider.  Therapeutic Interventions: 1 on 1 counseling sessions, Psychoeducation, Medication administration, Evaluate responses to treatment, Monitor vital signs and CBGs as ordered, Perform/monitor CIWA, COWS, AIMS and Fall Risk screenings as ordered, Perform wound care treatments as ordered.  Evaluation of Outcomes: Progressing   LCSW Treatment Plan for Primary Diagnosis: Paranoid schizophrenia ([name]) Long Term Goal(s): Safe transition to appropriate next level of care at discharge, Engage patient in therapeutic group addressing interpersonal concerns.  Short Term Goals: Engage patient in aftercare planning with referrals and resources, Facilitate acceptance of mental health diagnosis and concerns, Facilitate patient progression through stages of change regarding substance use diagnoses and concerns, Identify triggers associated with mental health/substance abuse issues and Increase skills for wellness and recovery  Therapeutic Interventions: Assess for all discharge needs, 1 to 1 time with Social worker, Explore available resources and support systems, Assess for adequacy in community support network, Educate family and significant other(s) on suicide prevention, Complete Psychosocial Assessment, Interpersonal group therapy.  Evaluation of Outcomes: Progressing   Progress in Treatment: Attending groups: Yes. Participating in groups: Yes. Taking medication as prescribed: Yes. Toleration medication: Yes. Family/Significant other contact made: Yes,  individual(s) contacted:  Patients Brother Dustin [name] Patient understands diagnosis: Yes. Discussing patient identified problems/goals with staff: Yes. Medical problems stabilized or resolved: Yes. Denies suicidal/homicidal ideation: Yes. Issues/concerns per patient self-inventory: No. Other:  New problem(s) identified: No, Describe:  None  New Short Term/Long Term Goal(s): "To get out of here."  Discharge Plan or Barriers: . At discharge, patient will return back home with his brother and continue to follow up with his ACTT Team.   Reason for Continuation of Hospitalization: Medication stabilization  Estimated Length of Stay: 3-5 days  Attendees: Patient: 09/14/2017 11:37 AM  Physician: Amador Cunas, MD 09/14/2017 11:37 AM  Nursing: Eugenio Hoes, RN 09/14/2017 11:37 AM  RN Care Manager: 09/14/2017 11:37 AM  Social Worker: Darin Engels, [place] 09/14/2017 11:37 AM  Recreational Therapist: Isaias Sakai. Marcello Fennel, LRT 09/14/2017 11:37 AM  Other:n Alden Hipp, LCSW  09/14/2017 11:37 AM  Other: Derrek Gu LCSW 09/14/2017 11:37 AM  Other: 09/14/2017 11:37 AM    Scribe for Treatment Team: Darin Engels, LCSW 09/14/2017 11:37 AM

## 2017-09-14 NOTE — Plan of Care (Signed)
Patient slept for Estimated Hours of 8.15; Precautionary checks every 15 minutes for safety maintained, room free of safety hazards, patient sustains no injury or falls during this shift.  Problem: Education: Goal: Knowledge of [facility] Education information/materials will improve Outcome: Progressing Goal: Emotional status will improve Outcome: Progressing Goal: Mental status will improve Outcome: Progressing   Problem: Activity: Goal: Interest or engagement in activities will improve Outcome: Progressing   Problem: Coping: Goal: Ability to identify and develop effective coping behavior will improve Outcome: Progressing   Problem: Self-Concept: Goal: Will verbalize positive feelings about self Outcome: Progressing   Problem: Coping: Goal: Level of anxiety will decrease Outcome: Progressing   Problem: Elimination: Goal: Will not experience complications related to bowel motility Outcome: Progressing

## 2017-09-14 NOTE — Progress Notes (Signed)
[facility] MD Progress Note  09/14/2017 1:45 PM Dustin [name]  MRN:  [id number] Subjective:  Pt has been calm on the unit. He states that he is doing well and feeling calm. Denies paranoia, AH, VH> he is less delusional today. His speech is garbled today. HE is asking when he can get his shot so he can discharge.   Principal Problem: Paranoid schizophrenia ([place]) Diagnosis:   Patient Active Problem List   Diagnosis Date Noted  . Paranoid schizophrenia ([place]) [F20.0] 09/03/2017    Priority: High  . Schizoaffective disorder, bipolar type ([place]) [F25.0] 01/24/2017    Priority: High  . Tardive dyskinesia [G24.01] 01/24/2017  . Tobacco use disorder [F17.200] 11/29/2016   Total Time spent with patient: 15 minutes  Past Psychiatric History: See H&P  Past Medical History:  Past Medical History:  Diagnosis Date  . Schizophrenia ([place])    History reviewed. No pertinent surgical history. Family History: History reviewed. No pertinent family history. Family Psychiatric  History: See h&P Social History:  Social History   Substance and Sexual Activity  Alcohol Use Yes  . Alcohol/week: 4.2 oz  . Types: 7 Shots of liquor per week   Comment: 1 drink every night- gin     Social History   Substance and Sexual Activity  Drug Use No    Social History   Socioeconomic History  . Marital status: Single    Spouse name: Not on file  . Number of children: Not on file  . Years of education: Not on file  . Highest education level: Not on file  Occupational History  . Not on file  Social Needs  . Financial resource strain: Not on file  . Food insecurity:    Worry: Not on file    Inability: Not on file  . Transportation needs:    Medical: Not on file    Non-medical: Not on file  Tobacco Use  . Smoking status: Current Every Day Smoker    Packs/day: 0.50    Years: 15.00    Pack years: 7.50  . Smokeless tobacco: Never Used  Substance and Sexual Activity  . Alcohol use: Yes    Alcohol/week: 4.2  oz    Types: 7 Shots of liquor per week    Comment: 1 drink every night- gin  . Drug use: No  . Sexual activity: Never    Birth control/protection: None  Lifestyle  . Physical activity:    Days per week: Not on file    Minutes per session: Not on file  . Stress: Not on file  Relationships  . Social connections:    Talks on phone: Not on file    Gets together: Not on file    Attends religious service: Not on file    Active member of club or organization: Not on file    Attends meetings of clubs or organizations: Not on file    Relationship status: Not on file  Other Topics Concern  . Not on file  Social History Narrative  . Not on file   Additional Social History:                         Sleep: Good  Appetite:  Good  Current Medications: Current Facility-Administered Medications  Medication Dose Route Frequency Provider Last Rate Last Dose  . acetaminophen (TYLENOL) tablet 650 mg  650 mg Oral Q6H PRN Pucilowska, Jolanta B, MD   650 mg at 09/13/17 2136  .  alum & mag hydroxide-simeth (MAALOX/MYLANTA) 200-200-20 MG/5ML suspension 30 mL  30 mL Oral Q4H PRN Pucilowska, Jolanta B, MD      . divalproex (DEPAKOTE) DR tablet 500 mg  500 mg Oral BH-q7a Bertran Zeimet, [name], MD   500 mg at 09/14/17 2505   And  . divalproex (DEPAKOTE) DR tablet 1,000 mg  1,000 mg Oral QHS Nimco Bivens R, MD   1,000 mg at 09/13/17 2135  . haloperidol (HALDOL) tablet 5 mg  5 mg Oral BID Gisell Buehrle, [name], MD   5 mg at 09/14/17 0839  . hydrOXYzine (ATARAX/VISTARIL) tablet 25 mg  25 mg Oral TID PRN Orson Slick B, MD   25 mg at 09/12/17 2133  . magnesium hydroxide (MILK OF MAGNESIA) suspension 30 mL  30 mL Oral Daily PRN Pucilowska, Jolanta B, MD      . multivitamin with minerals tablet 1 tablet  1 tablet Oral Daily Pucilowska, Jolanta B, MD   1 tablet at 09/14/17 0839  . nicotine (NICODERM CQ - dosed in mg/24 hours) patch 21 mg  21 mg Transdermal Daily Pucilowska, Jolanta B, MD   Stopped at  09/07/17 0855  . [START ON 09/25/2017] paliperidone (INVEGA SUSTENNA) injection 234 mg  234 mg Intramuscular Q28 days Damaris Abeln, [name], MD      . traZODone (DESYREL) tablet 50 mg  50 mg Oral QHS PRN Marylin Crosby, MD   50 mg at 09/12/17 2133    Lab Results: No results found for this or any previous visit (from the past 48 hour(s)).  Blood Alcohol level:  Lab Results  Component Value Date   ETH <10 08/31/2017   ETH <10 39/76/7341    Metabolic Disorder Labs: Lab Results  Component Value Date   HGBA1C 5.4 08/17/2017   MPG 108.28 08/17/2017   MPG 105.41 01/12/2017   No results found for: PROLACTIN Lab Results  Component Value Date   CHOL 131 08/17/2017   TRIG 111 08/17/2017   HDL 46 08/17/2017   CHOLHDL 2.8 08/17/2017   VLDL 22 08/17/2017   LDLCALC 63 08/17/2017   LDLCALC 88 01/12/2017    Physical Findings: AIMS: Facial and Oral Movements Muscles of Facial Expression: None, normal Lips and Perioral Area: None, normal Jaw: None, normal Tongue: None, normal,Extremity Movements Upper (arms, wrists, hands, fingers): None, normal Lower (legs, knees, ankles, toes): None, normal, Trunk Movements Neck, shoulders, hips: None, normal, Overall Severity Severity of abnormal movements (highest score from questions above): None, normal Incapacitation due to abnormal movements: None, normal Patient's awareness of abnormal movements (rate only patient's report): No Awareness, Dental Status Current problems with teeth and/or dentures?: No Does patient usually wear dentures?: No  CIWA:    COWS:     Musculoskeletal: Strength & Muscle Tone: within normal limits Gait & Station: normal Patient leans: N/A  Psychiatric Specialty Exam: Physical Exam  Nursing note and vitals reviewed.   Review of Systems  All other systems reviewed and are negative.   Blood pressure (!) 85/74, pulse (!) 111, temperature 98.4 F (36.9 C), temperature source Oral, resp. rate 18, height 6' (1.829 m),  weight 72.6 kg (160 lb 0.9 oz), SpO2 100 %.Body mass index is 21.71 kg/m.  General Appearance: Casual  Eye Contact:  Minimal  Speech:  Garbled  Volume:  Normal  Mood:  Euthymic  Affect:  Congruent  Thought Process:  Coherent and Goal Directed  Orientation:  Full (Time, Place, and Person)  Thought Content:  Logical  Suicidal Thoughts:  No  Homicidal Thoughts:  No  Memory:  Immediate;   Fair  Judgement:  Impaired  Insight:  Lacking  Psychomotor Activity:  Normal  Concentration:  Attention Span: Fair  Recall:  [employer] of Knowledge:  Fair  Language:  Fair  Akathisia:  No      Assets:  Resilience  ADL's:  Intact  Cognition:  WNL  Sleep:  Number of Hours: 8.15     Treatment Plan Summary: 62 yo male admitted due to psychosis. He has been very calm on the unit. Thought process is clearing than on admission. He has been medication compliant. BP is low today which is the first episodes of hypotension. Will monitor.   Plan:  Schizoaffective disorder -Continue oral Haldol 5 mg BID. Will give haldol Decanoate on Wednesday so it is close to when [place] injection is due -Continue Depakote 500 mg qam and 1000 mg qhs  Dispo -He will return to brothers on discharge and follow with ACT team   Marylin Crosby, MD 09/14/2017, 1:45 PM

## 2017-09-14 NOTE — Progress Notes (Signed)
D:Pt denies SI/HI/AVH. Pt is pleasant and cooperative, but can be tangential during interactions. Pt. has no Complaints, but does report he is still trying to get used to his medications.  Patient Interaction appropriately engaging. Pt. Frequently observed in the milieu with peers watching tv.   A: Q x 15 minute observation checks were completed for safety. Patient was provided with education. Patient was given scheduled medications. Patient  was encourage to attend groups, participate in unit activities and continue with plan of care.   R:Patient is complaint with medication and unit procedures. Pt. Does not attend groups.             Precautionary checks every 15 minutes for safety maintained, room free of safety hazards, patient sustains no injury or falls during this shift.

## 2017-09-14 NOTE — Plan of Care (Signed)
Pt. Verbalizes understanding of provided education. Pt. Denies SI/HI during assessment. Pt. Verbally is able to contract for safety. Pt. States he can remain safe while on the unit. Pt. Does not attend groups. Pt. Reports doing "good" this evening.    Problem: Activity: Goal: Interest or engagement in activities will improve Outcome: Not Progressing   Problem: Education: Goal: Knowledge of [facility] Education information/materials will improve Outcome: Progressing   Problem: Self-Concept: Goal: Will verbalize positive feelings about self Outcome: Progressing

## 2017-09-14 NOTE — Progress Notes (Signed)
Patient ID: Dustin [name], male   DOB: [birth year], 62 y.o.   MRN: [id number] "Do you know when that shot will be ready? I think I am ready if I can just get the shot, I can go home.." Neat appearance, low deep voice volume, no odd bizarre behavior, thought processing still impaired; denied SI/HI/AVH.

## 2017-09-15 NOTE — Progress Notes (Addendum)
[facility] MD Progress Note  09/15/2017 3:16 PM Dustin [name]  MRN:  [id number] Subjective:   I'm ok" Pt continue to be delusional hyper religious but denies AVH, denies SI/HI. Med compliant denies side effects. Pt anxious to go home.   Principal Problem: Paranoid schizophrenia ([place]) Diagnosis:   Patient Active Problem List   Diagnosis Date Noted  . Paranoid schizophrenia ([place]) [F20.0] 09/03/2017  . Schizoaffective disorder, bipolar type ([place]) [F25.0] 01/24/2017  . Tardive dyskinesia [G24.01] 01/24/2017  . Tobacco use disorder [F17.200] 11/29/2016   Total Time spent with patient: 30 minutes  Past Psychiatric History: no new info  Past Medical History:  Past Medical History:  Diagnosis Date  . Schizophrenia ([place])    History reviewed. No pertinent surgical history. Family History: History reviewed. No pertinent family history. Family Psychiatric  History: no new info  Social History:  Social History   Substance and Sexual Activity  Alcohol Use Yes  . Alcohol/week: 4.2 oz  . Types: 7 Shots of liquor per week   Comment: 1 drink every night- gin     Social History   Substance and Sexual Activity  Drug Use No    Social History   Socioeconomic History  . Marital status: Single    Spouse name: Not on file  . Number of children: Not on file  . Years of education: Not on file  . Highest education level: Not on file  Occupational History  . Not on file  Social Needs  . Financial resource strain: Not on file  . Food insecurity:    Worry: Not on file    Inability: Not on file  . Transportation needs:    Medical: Not on file    Non-medical: Not on file  Tobacco Use  . Smoking status: Current Every Day Smoker    Packs/day: 0.50    Years: 15.00    Pack years: 7.50  . Smokeless tobacco: Never Used  Substance and Sexual Activity  . Alcohol use: Yes    Alcohol/week: 4.2 oz    Types: 7 Shots of liquor per week    Comment: 1 drink every night- gin  . Drug use: No  . Sexual  activity: Never    Birth control/protection: None  Lifestyle  . Physical activity:    Days per week: Not on file    Minutes per session: Not on file  . Stress: Not on file  Relationships  . Social connections:    Talks on phone: Not on file    Gets together: Not on file    Attends religious service: Not on file    Active member of club or organization: Not on file    Attends meetings of clubs or organizations: Not on file    Relationship status: Not on file  Other Topics Concern  . Not on file  Social History Narrative  . Not on file   Additional Social History:                         Sleep: Good  Appetite:  Fair  Current Medications: Current Facility-Administered Medications  Medication Dose Route Frequency Provider Last Rate Last Dose  . acetaminophen (TYLENOL) tablet 650 mg  650 mg Oral Q6H PRN Pucilowska, Jolanta B, MD   650 mg at 09/13/17 2136  . alum & mag hydroxide-simeth (MAALOX/MYLANTA) 200-200-20 MG/5ML suspension 30 mL  30 mL Oral Q4H PRN Pucilowska, Jolanta B, MD      .  divalproex (DEPAKOTE) DR tablet 500 mg  500 mg Oral BH-q7a [name], [name], MD   500 mg at 09/15/17 0857   And  . divalproex (DEPAKOTE) DR tablet 1,000 mg  1,000 mg Oral QHS [name], [name], MD   1,000 mg at 09/14/17 2136  . haloperidol (HALDOL) tablet 5 mg  5 mg Oral BID [name], [name], MD   5 mg at 09/15/17 0857  . hydrOXYzine (ATARAX/VISTARIL) tablet 25 mg  25 mg Oral TID PRN Orson Slick B, MD   25 mg at 09/12/17 2133  . magnesium hydroxide (MILK OF MAGNESIA) suspension 30 mL  30 mL Oral Daily PRN Pucilowska, Jolanta B, MD      . multivitamin with minerals tablet 1 tablet  1 tablet Oral Daily Pucilowska, Jolanta B, MD   1 tablet at 09/15/17 0857  . nicotine (NICODERM CQ - dosed in mg/24 hours) patch 21 mg  21 mg Transdermal Daily Pucilowska, Jolanta B, MD   Stopped at 09/07/17 0855  . [START ON 09/25/2017] paliperidone (INVEGA SUSTENNA) injection 234 mg  234 mg Intramuscular Q28  days [name], [name], MD      . traZODone (DESYREL) tablet 50 mg  50 mg Oral QHS PRN Marylin Crosby, MD   50 mg at 09/12/17 2133    Lab Results: No results found for this or any previous visit (from the past 48 hour(s)).  Blood Alcohol level:  Lab Results  Component Value Date   ETH <10 08/31/2017   ETH <10 48/54/6270    Metabolic Disorder Labs: Lab Results  Component Value Date   HGBA1C 5.4 08/17/2017   MPG 108.28 08/17/2017   MPG 105.41 01/12/2017   No results found for: PROLACTIN Lab Results  Component Value Date   CHOL 131 08/17/2017   TRIG 111 08/17/2017   HDL 46 08/17/2017   CHOLHDL 2.8 08/17/2017   VLDL 22 08/17/2017   LDLCALC 63 08/17/2017   LDLCALC 88 01/12/2017    Physical Findings: AIMS: Facial and Oral Movements Muscles of Facial Expression: None, normal Lips and Perioral Area: None, normal Jaw: None, normal Tongue: None, normal,Extremity Movements Upper (arms, wrists, hands, fingers): None, normal Lower (legs, knees, ankles, toes): None, normal, Trunk Movements Neck, shoulders, hips: None, normal, Overall Severity Severity of abnormal movements (highest score from questions above): None, normal Incapacitation due to abnormal movements: None, normal Patient's awareness of abnormal movements (rate only patient's report): No Awareness, Dental Status Current problems with teeth and/or dentures?: No Does patient usually wear dentures?: No  CIWA:    COWS:     Musculoskeletal: Strength & Muscle Tone: within normal limits Gait & Station: normal Patient leans:   Psychiatric Specialty Exam: Physical Exam  Nursing note and vitals reviewed.   ROS  Blood pressure 98/65, pulse 61, temperature 98.4 F (36.9 C), temperature source Oral, resp. rate 16, height 6' (1.829 m), weight 72.6 kg (160 lb 0.9 oz), SpO2 100 %.Body mass index is 21.71 kg/m.  General Appearance: age appropriate  Eye Contact:  Minimal  Speech:  disorganized  Volume:  Normal  Mood:   Euthymic  Affect:  Congruent, anxious  Thought Process:   Goal Directed  Orientation:  Full (Time, Place, and Person)  Thought Content:  wanting to go home  Suicidal Thoughts:  No  Homicidal Thoughts:  No  Memory:  Immediate;   Fair  Judgement:  Impaired  Insight:  Lacking  Psychomotor Activity:  Normal  Concentration:  Attention Span: Fair  Recall:  good  Fund of Knowledge:  Fair  Language:  Fair  Akathisia:  No      Assets:  Resilience  ADL's:  Intact  Cognition: fair  Sleep:  Number of Hours: 7        Treatment Plan Summary: Daily contact with patient to assess and evaluate symptoms and progress in treatment and Medication management. Pt still psychotic.  Cont current psych meds.  QTc-399. VS stable.  [name], MD 09/15/2017, 3:16 PM

## 2017-09-15 NOTE — Progress Notes (Signed)
D:Pt denies SI/HI/AVH. Pt is pleasant and cooperative, but can be tangential during interactions still. Pt.has no Complaints, but does report he is eager to go home, wants his next long acting shot.Patient Interaction appropriately engaging. Pt. Frequently observed in the milieu with peers watching tv. Participates in snacks.   A: Q x 15 minute observation checks were completed for safety. Patient was provided with education. Patient was given scheduled medications. Patient was encourage to attend groups, participate in unit activities and continue with plan of care.   R:Patient is complaint with medication and unit procedures. Pt. Does not attend groups.             Precautionary checks every 15 minutes for safety maintained, room free of safety hazards, patient sustains no injury or falls during this shift.

## 2017-09-15 NOTE — BHH Group Notes (Signed)
LCSW Group Therapy Note 09/15/2017 1:15pm  Type of Therapy and Topic: Group Therapy: Feelings Around Returning Home & Establishing a Supportive Framework and Supporting Oneself When Supports Not Available  Participation Level: Did Not Attend  Description of Group:  Patients first processed thoughts and feelings about upcoming discharge. These included fears of upcoming changes, lack of change, new living environments, judgements and expectations from others and overall stigma of mental health issues. The group then discussed the definition of a supportive framework, what that looks and feels like, and how do to discern it from an unhealthy non-supportive network. The group identified different types of supports as well as what to do when your family/friends are less than helpful or unavailable  Therapeutic Goals  1. Patient will identify one healthy supportive network that they can use at discharge. 2. Patient will identify one factor of a supportive framework and how to tell it from an unhealthy network. 3. Patient able to identify one coping skill to use when they do not have positive supports from others. 4. Patient will demonstrate ability to communicate their needs through discussion and/or role plays.  Summary of Patient Progress:    Therapeutic Modalities Cognitive Behavioral Therapy Motivational Interviewing   Shashwat Cleary  CUEBAS-COLON, LCSW 09/15/2017 4:08 PM

## 2017-09-15 NOTE — Progress Notes (Signed)
Received Jesses this AM after breakfast, he was compliant with his medications. He denied all of his psychiatric symptoms and focused on his next injection. He is hoping after his second injection he will be able to go home. He spoke about Irineo Axon is working with him to aid with his care. Sometimes his thoughts are going in several directions, but he can be redirected to the topic at hand. This evening he told one of the nurses he was looking for a woman with a check and patted her on the hand.

## 2017-09-15 NOTE — [facility] Group Notes (Signed)
[place] [facility] Group Notes:  (Nursing/MHT/Case Management/Adjunct)  Date:  09/15/2017  Time:  10:17 PM  Type of Therapy:  Group Therapy  Participation Level:  Active  Participation Quality:  Appropriate  Affect:  Appropriate  Cognitive:  Appropriate  Insight:  Good  Engagement in Group:  Engaged  Modes of Intervention:  Activity  Summary of Progress/Problems:  Dustin [name] 09/15/2017, 10:17 PM

## 2017-09-15 NOTE — Plan of Care (Signed)
Pt. Verbalizes understanding of provided education. Pt. Reports doing good this evening. Pt. Observed relaxing watching tv during interaction with this Probation officer. Pt. Does not go to groups. Pt. Denies SI/HI and is still able to verbally contract for safety. Reports he can continue to remain safe while on the unit.    Problem: Activity: Goal: Interest or engagement in activities will improve Outcome: Not Progressing   Problem: Education: Goal: Knowledge of [facility] Education information/materials will improve Outcome: Progressing   Problem: Self-Concept: Goal: Will verbalize positive feelings about self Outcome: Progressing

## 2017-09-16 NOTE — Progress Notes (Addendum)
Received Josten this AM on his way back to his room after breakfast, he was compliant with his medications.Then he became tearful related to his extended stay here. He is hoping to receive his injection next week that will make him feel closer to his discharge date. He remained OOB in the milieu most of the day. Later this evening he reminded this Probation officer again about his injection.

## 2017-09-16 NOTE — Progress Notes (Signed)
D:Pt denies SI/HI/AVH. Pt is pleasant and cooperative, but can be tangential during interactions still. Pt.has noComplaints, but does report again this evening he is eager to go home, wants his next long acting shot hopefully as soon as possible he reports. Pt. During assessment reports he would like sometime after taking his long-acting IM medication to adjust, because, "it makes me drunk".Patient Interactionappropriately engaging. Pt. Frequently observed in the milieu with peers watching tv.Participates in snacks. Contracts verbally for safety. Pt. Earlier in the day was upset about being still in the hospital, but reports doing much better this evening and laughs and smiles frequently during assessment.   A: Q x 15 minute observation checks were completed for safety. Patient was provided with education. Patient was given scheduled medications. Patient was encourage to attend groups, participate in unit activities and continue with plan of care.   R:Patient is complaint with medication and unit procedures. Pt. Does not attend groups.            Precautionary checks every 15 minutes for safety maintained, room free of safety hazards, patient sustains no injury or falls during this shift.

## 2017-09-16 NOTE — Plan of Care (Signed)
Pt. Verbalizes understanding of provided education. Pt. Reports feeling better this evening. Pt. Continues to not attend groups.    Problem: Activity: Goal: Interest or engagement in activities will improve Outcome: Not Progressing   Problem: Education: Goal: Knowledge of [facility] Education information/materials will improve Outcome: Progressing Goal: Emotional status will improve Outcome: Progressing

## 2017-09-16 NOTE — Progress Notes (Signed)
[facility] MD Progress Note  09/16/2017 5:14 PM Dustin [name]  MRN:  [id number] Subjective:   I'm fine" Pt asking when he will get his another shot of LAI, calm , cooperative but continue to be delusional hyper religious but denies AVH, denies SI/HI. Med compliant denies side effects. Pt anxious to go home.   Principal Problem: Paranoid schizophrenia ([place]) Diagnosis:   Patient Active Problem List   Diagnosis Date Noted  . Paranoid schizophrenia ([place]) [F20.0] 09/03/2017  . Schizoaffective disorder, bipolar type ([place]) [F25.0] 01/24/2017  . Tardive dyskinesia [G24.01] 01/24/2017  . Tobacco use disorder [F17.200] 11/29/2016   Total Time spent with patient: 30 minutes  Past Psychiatric History: no new info  Past Medical History:  Past Medical History:  Diagnosis Date  . Schizophrenia ([place])    History reviewed. No pertinent surgical history. Family History: History reviewed. No pertinent family history. Family Psychiatric  History: no new info  Social History:  Social History   Substance and Sexual Activity  Alcohol Use Yes  . Alcohol/week: 4.2 oz  . Types: 7 Shots of liquor per week   Comment: 1 drink every night- gin     Social History   Substance and Sexual Activity  Drug Use No    Social History   Socioeconomic History  . Marital status: Single    Spouse name: Not on file  . Number of children: Not on file  . Years of education: Not on file  . Highest education level: Not on file  Occupational History  . Not on file  Social Needs  . Financial resource strain: Not on file  . Food insecurity:    Worry: Not on file    Inability: Not on file  . Transportation needs:    Medical: Not on file    Non-medical: Not on file  Tobacco Use  . Smoking status: Current Every Day Smoker    Packs/day: 0.50    Years: 15.00    Pack years: 7.50  . Smokeless tobacco: Never Used  Substance and Sexual Activity  . Alcohol use: Yes    Alcohol/week: 4.2 oz    Types: 7 Shots of liquor  per week    Comment: 1 drink every night- gin  . Drug use: No  . Sexual activity: Never    Birth control/protection: None  Lifestyle  . Physical activity:    Days per week: Not on file    Minutes per session: Not on file  . Stress: Not on file  Relationships  . Social connections:    Talks on phone: Not on file    Gets together: Not on file    Attends religious service: Not on file    Active member of club or organization: Not on file    Attends meetings of clubs or organizations: Not on file    Relationship status: Not on file  Other Topics Concern  . Not on file  Social History Narrative  . Not on file   Additional Social History:                         Sleep: Good  Appetite:  Fair  Current Medications: Current Facility-Administered Medications  Medication Dose Route Frequency Provider Last Rate Last Dose  . acetaminophen (TYLENOL) tablet 650 mg  650 mg Oral Q6H PRN Pucilowska, Jolanta B, MD   650 mg at 09/13/17 2136  . alum & mag hydroxide-simeth (MAALOX/MYLANTA) 200-200-20 MG/5ML suspension 30 mL  30  mL Oral Q4H PRN Pucilowska, Jolanta B, MD      . divalproex (DEPAKOTE) DR tablet 500 mg  500 mg Oral BH-q7a [name], [name], MD   500 mg at 09/16/17 0805   And  . divalproex (DEPAKOTE) DR tablet 1,000 mg  1,000 mg Oral QHS [name], [name], MD   1,000 mg at 09/15/17 2132  . haloperidol (HALDOL) tablet 5 mg  5 mg Oral BID [name], [name], MD   5 mg at 09/16/17 0806  . hydrOXYzine (ATARAX/VISTARIL) tablet 25 mg  25 mg Oral TID PRN Orson Slick B, MD   25 mg at 09/12/17 2133  . magnesium hydroxide (MILK OF MAGNESIA) suspension 30 mL  30 mL Oral Daily PRN Pucilowska, Jolanta B, MD      . multivitamin with minerals tablet 1 tablet  1 tablet Oral Daily Pucilowska, Jolanta B, MD   1 tablet at 09/16/17 0806  . nicotine (NICODERM CQ - dosed in mg/24 hours) patch 21 mg  21 mg Transdermal Daily Pucilowska, Jolanta B, MD   Stopped at 09/07/17 0855  . [START ON 09/25/2017]  paliperidone (INVEGA SUSTENNA) injection 234 mg  234 mg Intramuscular Q28 days [name], [name], MD      . traZODone (DESYREL) tablet 50 mg  50 mg Oral QHS PRN Marylin Crosby, MD   50 mg at 09/12/17 2133    Lab Results: No results found for this or any previous visit (from the past 48 hour(s)).  Blood Alcohol level:  Lab Results  Component Value Date   ETH <10 08/31/2017   ETH <10 67/34/1937    Metabolic Disorder Labs: Lab Results  Component Value Date   HGBA1C 5.4 08/17/2017   MPG 108.28 08/17/2017   MPG 105.41 01/12/2017   No results found for: PROLACTIN Lab Results  Component Value Date   CHOL 131 08/17/2017   TRIG 111 08/17/2017   HDL 46 08/17/2017   CHOLHDL 2.8 08/17/2017   VLDL 22 08/17/2017   LDLCALC 63 08/17/2017   LDLCALC 88 01/12/2017    Physical Findings: AIMS: Facial and Oral Movements Muscles of Facial Expression: None, normal Lips and Perioral Area: None, normal Jaw: None, normal Tongue: None, normal,Extremity Movements Upper (arms, wrists, hands, fingers): None, normal Lower (legs, knees, ankles, toes): None, normal, Trunk Movements Neck, shoulders, hips: None, normal, Overall Severity Severity of abnormal movements (highest score from questions above): None, normal Incapacitation due to abnormal movements: None, normal Patient's awareness of abnormal movements (rate only patient's report): No Awareness, Dental Status Current problems with teeth and/or dentures?: No Does patient usually wear dentures?: No  CIWA:    COWS:     Musculoskeletal: Strength & Muscle Tone: within normal limits Gait & Station: normal Patient leans:   Psychiatric Specialty Exam: Physical Exam  Nursing note and vitals reviewed.   ROS  Blood pressure 103/67, pulse (!) 104, temperature 97.9 F (36.6 C), temperature source Oral, resp. rate 16, height 6' (1.829 m), weight 72.6 kg (160 lb 0.9 oz), SpO2 100 %.Body mass index is 21.71 kg/m.  General Appearance: age appropriate   Eye Contact:  Minimal  Speech:  disorganized  Volume:  Normal  Mood:  fine"  Affect:  Congruent, anxious  Thought Process:   Goal Directed  Orientation:  Full (Time, Place, and Person)  Thought Content:  wanting to go home, asking about next shot  Suicidal Thoughts:  No  Homicidal Thoughts:  No  Memory:  Immediate;   Fair  Judgement:  Impaired  Insight:  improving  Psychomotor Activity:  Normal  Concentration:  Attention Span: Fair  Recall:  good  Fund of Knowledge:  Fair  Language:  Fair  Akathisia:  No      Assets:  Resilience  ADL's:  Intact  Cognition: fair  Sleep:  Number of Hours: 7        Treatment Plan Summary: Daily contact with patient to assess and evaluate symptoms and progress in treatment and Medication management. Psychosis improving.   Cont current psych meds.  QTc-399.  Continue oral Haldol 5 mg BID. Plan to  give haldol [place] on Wednesday so it is close to when Dustin [name] injection is due   Dustin Chancellor, MD 09/16/2017, 5:14 PMPatient ID: Dustin [name], male   DOB: [birth year], 62 y.o.   MRN: [id number]

## 2017-09-16 NOTE — [facility] Group Notes (Signed)
LCSW Group Therapy Note 09/16/2017 1:00pm Type of Therapy and Topic:  Group Therapy:  Communication Participation Level:  Did Not Attend  Description of Group: Patients will identify how individuals communicate with one another appropriately and inappropriately.  Patients will be guided to discuss their thoughts, feelings and behaviors related to barriers when communicating.  The group will process together ways to execute positive and appropriate communication with attention given to how one uses behavior, tone and body language.  Patients will be encouraged to reflect on a situation where they were successfully able to communicate and what made this example successful.  Group will identify specific changes they are motivated to make in order to overcome communication barriers with self, peers, authority, and parents.  This group will be process-oriented with patients participating in exploration of their own experiences, giving and receiving support, and challenging self and other group members.   Therapeutic Goals 1. Patient will express an understanding of the four types of verbal communication (passive, aggressive, passive aggressive, and assertive).  2. Patient will identify feelings (such as fear or worry), thought process and behaviors related to why people internalize feelings rather than express self openly. 3. Patient will identify their main communication style, and report how they plan to change their style to assertive. 4. Members will then practice through role play how to communicate using I statements, I feel statements, and acknowledging feelings rather than displacing feelings on others Summary of Patient Progress: Pt was invited to attend group but chose not to attend. CSW will continue to encourage pt to attend group throughout their admission.  Therapeutic Modalities Cognitive Behavioral Therapy Motivational Interviewing Solution Focused Therapy  Alden Hipp, MSW,  LCSW Clinical Social Worker 09/16/2017 1:58 PM

## 2017-09-17 MED ORDER — HALOPERIDOL DECANOATE 100 MG/ML IM SOLN
100.0000 mg | Freq: Once | INTRAMUSCULAR | Status: AC
  Administered 2017-09-19: 100 mg via INTRAMUSCULAR
  Filled 2017-09-17: qty 1

## 2017-09-17 NOTE — Plan of Care (Signed)
Emotional and mental status improved . Attempted to attend activities on the unit  continue to work on coping skills Decreased anxiety. Voice of no issues around constipation. Patient received  information from  staff in concrete form  for better understanding  Problem: Education: Goal: Emotional status will improve Outcome: Progressing Goal: Mental status will improve Outcome: Progressing   Problem: Activity: Goal: Interest or engagement in activities will improve Outcome: Progressing   Problem: Coping: Goal: Ability to identify and develop effective coping behavior will improve Outcome: Progressing   Problem: Self-Concept: Goal: Will verbalize positive feelings about self Outcome: Progressing   Problem: Coping: Goal: Level of anxiety will decrease Outcome: Progressing   Problem: Elimination: Goal: Will not experience complications related to bowel motility Outcome: Progressing

## 2017-09-17 NOTE — Progress Notes (Signed)
[facility] MD Progress Note  09/17/2017 12:21 PM Dustin [name]  MRN:  [id number]   Subjective: Pt has been calm on the unit with no outbursts. He is upset that he is still in the hospital. He states taht he dose not want Abilify shot "because it makes me feel drunk." Discussed that he is no longer on Abilify and he is happy about this. HE states that he likes the Haldol medication and agrees to get the injection on Wednesday. He states that he wants to get involved with working with people because he likes to be around people "and I like to help different kinds of people." HE was able to have an appropriate and rational conversation about this. He did not have any delusional thought content today. He is reading the bible today. He has been social with peers and spending time outside. HE is eating well.   Principal Problem: Paranoid schizophrenia ([place]) Diagnosis:   Patient Active Problem List   Diagnosis Date Noted  . Paranoid schizophrenia ([place]) [F20.0] 09/03/2017    Priority: High  . Schizoaffective disorder, bipolar type ([place]) [F25.0] 01/24/2017    Priority: High  . Tardive dyskinesia [G24.01] 01/24/2017  . Tobacco use disorder [F17.200] 11/29/2016   Total Time spent with patient: 20 minutes  Past Psychiatric History: See H&P  Past Medical History:  Past Medical History:  Diagnosis Date  . Schizophrenia ([place])    History reviewed. No pertinent surgical history. Family History: History reviewed. No pertinent family history. Family Psychiatric  History: See H&P Social History:  Social History   Substance and Sexual Activity  Alcohol Use Yes  . Alcohol/week: 4.2 oz  . Types: 7 Shots of liquor per week   Comment: 1 drink every night- gin     Social History   Substance and Sexual Activity  Drug Use No    Social History   Socioeconomic History  . Marital status: Single    Spouse name: Not on file  . Number of children: Not on file  . Years of education: Not on file  . Highest  education level: Not on file  Occupational History  . Not on file  Social Needs  . Financial resource strain: Not on file  . Food insecurity:    Worry: Not on file    Inability: Not on file  . Transportation needs:    Medical: Not on file    Non-medical: Not on file  Tobacco Use  . Smoking status: Current Every Day Smoker    Packs/day: 0.50    Years: 15.00    Pack years: 7.50  . Smokeless tobacco: Never Used  Substance and Sexual Activity  . Alcohol use: Yes    Alcohol/week: 4.2 oz    Types: 7 Shots of liquor per week    Comment: 1 drink every night- gin  . Drug use: No  . Sexual activity: Never    Birth control/protection: None  Lifestyle  . Physical activity:    Days per week: Not on file    Minutes per session: Not on file  . Stress: Not on file  Relationships  . Social connections:    Talks on phone: Not on file    Gets together: Not on file    Attends religious service: Not on file    Active member of club or organization: Not on file    Attends meetings of clubs or organizations: Not on file    Relationship status: Not on file  Other Topics Concern  .  Not on file  Social History Narrative  . Not on file   Additional Social History:                         Sleep: Fair, "someday's I sleep good, some Days I don't."  Appetite:  Good  Current Medications: Current Facility-Administered Medications  Medication Dose Route Frequency Provider Last Rate Last Dose  . acetaminophen (TYLENOL) tablet 650 mg  650 mg Oral Q6H PRN Pucilowska, Jolanta B, MD   650 mg at 09/17/17 0836  . alum & mag hydroxide-simeth (MAALOX/MYLANTA) 200-200-20 MG/5ML suspension 30 mL  30 mL Oral Q4H PRN Pucilowska, Jolanta B, MD      . divalproex (DEPAKOTE) DR tablet 500 mg  500 mg Oral BH-q7a Matheo Rathbone, [name], MD   500 mg at 09/17/17 0258   And  . divalproex (DEPAKOTE) DR tablet 1,000 mg  1,000 mg Oral QHS Ronisha Herringshaw R, MD   1,000 mg at 09/16/17 2126  . haloperidol (HALDOL) tablet  5 mg  5 mg Oral BID Marylin Crosby, MD   5 mg at 09/17/17 0835  . [START ON 09/19/2017] haloperidol decanoate (HALDOL DECANOATE) 100 MG/ML injection 100 mg  100 mg Intramuscular Once Ayelet Gruenewald R, MD      . hydrOXYzine (ATARAX/VISTARIL) tablet 25 mg  25 mg Oral TID PRN Pucilowska, Jolanta B, MD   25 mg at 09/12/17 2133  . magnesium hydroxide (MILK OF MAGNESIA) suspension 30 mL  30 mL Oral Daily PRN Pucilowska, Jolanta B, MD      . multivitamin with minerals tablet 1 tablet  1 tablet Oral Daily Pucilowska, Jolanta B, MD   1 tablet at 09/17/17 0835  . nicotine (NICODERM CQ - dosed in mg/24 hours) patch 21 mg  21 mg Transdermal Daily Pucilowska, Jolanta B, MD   Stopped at 09/07/17 0855  . [START ON 09/25/2017] paliperidone (INVEGA SUSTENNA) injection 234 mg  234 mg Intramuscular Q28 days Christen Bedoya, [name], MD      . traZODone (DESYREL) tablet 50 mg  50 mg Oral QHS PRN Marylin Crosby, MD   50 mg at 09/12/17 2133    Lab Results: No results found for this or any previous visit (from the past 48 hour(s)).  Blood Alcohol level:  Lab Results  Component Value Date   ETH <10 08/31/2017   ETH <10 52/77/8242    Metabolic Disorder Labs: Lab Results  Component Value Date   HGBA1C 5.4 08/17/2017   MPG 108.28 08/17/2017   MPG 105.41 01/12/2017   No results found for: PROLACTIN Lab Results  Component Value Date   CHOL 131 08/17/2017   TRIG 111 08/17/2017   HDL 46 08/17/2017   CHOLHDL 2.8 08/17/2017   VLDL 22 08/17/2017   LDLCALC 63 08/17/2017   LDLCALC 88 01/12/2017    Physical Findings: AIMS: Facial and Oral Movements Muscles of Facial Expression: None, normal Lips and Perioral Area: None, normal Jaw: None, normal Tongue: None, normal,Extremity Movements Upper (arms, wrists, hands, fingers): None, normal Lower (legs, knees, ankles, toes): None, normal, Trunk Movements Neck, shoulders, hips: None, normal, Overall Severity Severity of abnormal movements (highest score from questions above):  None, normal Incapacitation due to abnormal movements: None, normal Patient's awareness of abnormal movements (rate only patient's report): No Awareness, Dental Status Current problems with teeth and/or dentures?: No Does patient usually wear dentures?: No  CIWA:    COWS:     Musculoskeletal: Strength & Muscle Tone: within  normal limits Gait & Station: normal Patient leans: N/A  Psychiatric Specialty Exam: Physical Exam  Nursing note and vitals reviewed.   Review of Systems  All other systems reviewed and are negative.   Blood pressure 109/64, pulse (!) 111, temperature 98.8 F (37.1 C), temperature source Oral, resp. rate 16, height 6' (1.829 m), weight 72.6 kg (160 lb 0.9 oz), SpO2 100 %.Body mass index is 21.71 kg/m.  General Appearance: Casual  Eye Contact:  Fair  Speech:  Clear and Coherent  Volume:  Normal  Mood:  Euthymic  Affect:  Appropriate  Thought Process:  Coherent and Goal Directed  Orientation:  Full (Time, Place, and Person)  Thought Content:  Logical  Suicidal Thoughts:  No  Homicidal Thoughts:  No  Memory:  Immediate;   Fair  Judgement:  Impaired  Insight:  Lacking  Psychomotor Activity:  Normal  Concentration:  Concentration: Fair  Recall:  [employer] of Knowledge:  Fair  Language:  Fair  Akathisia:  No      Assets:  Resilience  ADL's:  Intact  Cognition:  WNL  Sleep:  Number of Hours: 7     Treatment Plan Summary: 62 yo male admitted due to psychosis and agitation. He has been very calm on the unit with no agitation at all. Thought process is much more clear and speech is less garbled. HE interacts well with other peers. He is tolerating Haldol.   Plan:  Schizoaffective disorder -Continue oral Haldol 5 mg BID. Will given Haldol Decanoate 100 mg on Wednesday Lorayne Bender SUstenna due on 4/23 -Continue Depakote 500 mg qam and 1000 mg qhs. Level 75  Dispo -He will return home on discharge and follow up with ACT team Marylin Crosby,  MD 09/17/2017, 12:21 PM

## 2017-09-17 NOTE — Progress Notes (Signed)
D:  Affect cheerful on approachEmotional and mental status improved . Attempted to attend activities on the unit  continue to work on coping skills Decreased anxiety. Voice of no issues around constipation. Patient received  information from  staff in concrete form  for better understanding  Patient stated slept good last night .Stated appetite is good and energy level  Is normal. Stated concentration is good .  auditory hallucinations  No pain concerns .No  ADL'S. Interacting with peers and staff.  A: Encourage patient participation with unit programming . Instruction  Given on  Medication , verbalize understanding. R: Voice no other concerns. Staff continue to monitor

## 2017-09-17 NOTE — BHH Group Notes (Signed)
[facility] Group Notes:  (Nursing/MHT/Case Management/Adjunct)  Date:  09/17/2017  Time:  3:22 PM  Type of Therapy:  Psychoeducational Skills  Participation Level:  Active  Participation Quality:  Appropriate  Affect:  Appropriate  Cognitive:  Appropriate  Insight:  Appropriate  Engagement in Group:  Engaged  Modes of Intervention:  Socialization  Summary of Progress/Problems:  Dustin [name] 09/17/2017, 3:22 PM

## 2017-09-17 NOTE — Progress Notes (Signed)
Recreation Therapy Notes   Date: 09/17/2017  Time: 9:30 am   Location: Craft Room   Behavioral response: N/A   Intervention Topic: Values   Discussion/Intervention: Patient did not attend group.   Clinical Observations/Feedback:  Patient did not attend group.   Tristan Bramble LRT/CTRS        Dustin [name] 09/17/2017 10:29 AM

## 2017-09-18 MED ORDER — PALIPERIDONE PALMITATE 234 MG/1.5ML IM SUSP: 234.0000 mg | Freq: Once | INTRAMUSCULAR | 0 refills | Status: DC

## 2017-09-18 MED ORDER — HALOPERIDOL 5 MG PO TABS: 5.0000 mg | ORAL_TABLET | Freq: Two times a day (BID) | ORAL | 0 refills | Status: DC

## 2017-09-18 MED ORDER — DIVALPROEX SODIUM 500 MG PO DR TAB: 500.0000 mg | DELAYED_RELEASE_TABLET | ORAL | 0 refills | Status: DC

## 2017-09-18 MED ORDER — HALOPERIDOL DECANOATE 100 MG/ML IM SOLN: 100.0000 mg | Freq: Once | INTRAMUSCULAR | Status: DC

## 2017-09-18 NOTE — Progress Notes (Signed)
Patient alert and oriented no distress noted , affect is bright thoughts are organized , complaint with medication will continue to monitor.

## 2017-09-18 NOTE — BHH Counselor (Signed)
CSW spoke with patients brother Keatin Benham [phone]. He reports that he would come and pick up the patient for discharge tomorrow 09/19/2017 between 12:30-1pm.   Darin Engels, MSW, [place], [place] 09/18/2017 2:28 PM

## 2017-09-18 NOTE — Progress Notes (Signed)
[facility] MD Progress Note  09/18/2017 2:17 PM Dustin [name]  MRN:  [id number] Subjective:  Pt is much more organized in conversation today. He is pleasant and smiling. He was able to have a very appropriate conversation with me about paying his bills and that since some of them are behind that he will have to pay for 2 months which he plans to do. He also talks very appropriately about his familiy and his father who used to be a drill sergent in the [employer]. He did not have any delusional thought content today and was not hyperreligious. We discussed about his medications including getting the Haldol injection tomorrow and that he needs to continue oral Haldol for a few weeks. We also discussed that he is now on 2 injections of medications and his next Lorayne Bender is due next week. He asked very appropriate questions about them and is very open to being on these two medications. HE agrees to get his Lorayne Bender shot in a few days.   Principal Problem: Paranoid schizophrenia ([place]) Diagnosis:   Patient Active Problem List   Diagnosis Date Noted  . Paranoid schizophrenia ([place]) [F20.0] 09/03/2017    Priority: High  . Schizoaffective disorder, bipolar type ([place]) [F25.0] 01/24/2017    Priority: High  . Tardive dyskinesia [G24.01] 01/24/2017  . Tobacco use disorder [F17.200] 11/29/2016   Total Time spent with patient: 20 minutes  Past Psychiatric History: See H&P  Past Medical History:  Past Medical History:  Diagnosis Date  . Schizophrenia ([place])    History reviewed. No pertinent surgical history. Family History: History reviewed. No pertinent family history. Family Psychiatric  History: See H&P Social History:  Social History   Substance and Sexual Activity  Alcohol Use Yes  . Alcohol/week: 4.2 oz  . Types: 7 Shots of liquor per week   Comment: 1 drink every night- gin     Social History   Substance and Sexual Activity  Drug Use No    Social History   Socioeconomic History  . Marital status:  Single    Spouse name: Not on file  . Number of children: Not on file  . Years of education: Not on file  . Highest education level: Not on file  Occupational History  . Not on file  Social Needs  . Financial resource strain: Not on file  . Food insecurity:    Worry: Not on file    Inability: Not on file  . Transportation needs:    Medical: Not on file    Non-medical: Not on file  Tobacco Use  . Smoking status: Current Every Day Smoker    Packs/day: 0.50    Years: 15.00    Pack years: 7.50  . Smokeless tobacco: Never Used  Substance and Sexual Activity  . Alcohol use: Yes    Alcohol/week: 4.2 oz    Types: 7 Shots of liquor per week    Comment: 1 drink every night- gin  . Drug use: No  . Sexual activity: Never    Birth control/protection: None  Lifestyle  . Physical activity:    Days per week: Not on file    Minutes per session: Not on file  . Stress: Not on file  Relationships  . Social connections:    Talks on phone: Not on file    Gets together: Not on file    Attends religious service: Not on file    Active member of club or organization: Not on file    Attends  meetings of clubs or organizations: Not on file    Relationship status: Not on file  Other Topics Concern  . Not on file  Social History Narrative  . Not on file   Additional Social History:                         Sleep: Fair  Appetite:  Good  Current Medications: Current Facility-Administered Medications  Medication Dose Route Frequency Provider Last Rate Last Dose  . acetaminophen (TYLENOL) tablet 650 mg  650 mg Oral Q6H PRN Pucilowska, Jolanta B, MD   650 mg at 09/17/17 0836  . alum & mag hydroxide-simeth (MAALOX/MYLANTA) 200-200-20 MG/5ML suspension 30 mL  30 mL Oral Q4H PRN Pucilowska, Jolanta B, MD      . divalproex (DEPAKOTE) DR tablet 500 mg  500 mg Oral BH-q7a Nathon Stefanski, [name], MD   500 mg at 09/18/17 0240   And  . divalproex (DEPAKOTE) DR tablet 1,000 mg  1,000 mg Oral QHS  Zenia Guest R, MD   1,000 mg at 09/17/17 2152  . haloperidol (HALDOL) tablet 5 mg  5 mg Oral BID Marylin Crosby, MD   5 mg at 09/18/17 9735  . [START ON 09/19/2017] haloperidol decanoate (HALDOL DECANOATE) 100 MG/ML injection 100 mg  100 mg Intramuscular Once Aiyana Stegmann R, MD      . hydrOXYzine (ATARAX/VISTARIL) tablet 25 mg  25 mg Oral TID PRN Pucilowska, Jolanta B, MD   25 mg at 09/12/17 2133  . magnesium hydroxide (MILK OF MAGNESIA) suspension 30 mL  30 mL Oral Daily PRN Pucilowska, Jolanta B, MD      . multivitamin with minerals tablet 1 tablet  1 tablet Oral Daily Pucilowska, Jolanta B, MD   1 tablet at 09/18/17 0812  . nicotine (NICODERM CQ - dosed in mg/24 hours) patch 21 mg  21 mg Transdermal Daily Pucilowska, Jolanta B, MD   Stopped at 09/07/17 0855  . [START ON 09/25/2017] paliperidone (INVEGA SUSTENNA) injection 234 mg  234 mg Intramuscular Q28 days Jaysten Essner, [name], MD      . traZODone (DESYREL) tablet 50 mg  50 mg Oral QHS PRN Marylin Crosby, MD   50 mg at 09/17/17 2152    Lab Results: No results found for this or any previous visit (from the past 48 hour(s)).  Blood Alcohol level:  Lab Results  Component Value Date   ETH <10 08/31/2017   ETH <10 32/99/2426    Metabolic Disorder Labs: Lab Results  Component Value Date   HGBA1C 5.4 08/17/2017   MPG 108.28 08/17/2017   MPG 105.41 01/12/2017   No results found for: PROLACTIN Lab Results  Component Value Date   CHOL 131 08/17/2017   TRIG 111 08/17/2017   HDL 46 08/17/2017   CHOLHDL 2.8 08/17/2017   VLDL 22 08/17/2017   LDLCALC 63 08/17/2017   LDLCALC 88 01/12/2017    Physical Findings: AIMS: Facial and Oral Movements Muscles of Facial Expression: None, normal Lips and Perioral Area: None, normal Jaw: None, normal Tongue: None, normal,Extremity Movements Upper (arms, wrists, hands, fingers): None, normal Lower (legs, knees, ankles, toes): None, normal, Trunk Movements Neck, shoulders, hips: None, normal, Overall  Severity Severity of abnormal movements (highest score from questions above): None, normal Incapacitation due to abnormal movements: None, normal Patient's awareness of abnormal movements (rate only patient's report): No Awareness, Dental Status Current problems with teeth and/or dentures?: No Does patient usually wear dentures?: No  CIWA:  COWS:     Musculoskeletal: Strength & Muscle Tone: within normal limits Gait & Station: normal Patient leans: N/A  Psychiatric Specialty Exam: Physical Exam  Nursing note and vitals reviewed.   Review of Systems  All other systems reviewed and are negative.   Blood pressure 93/77, pulse 96, temperature 98.2 F (36.8 C), temperature source Oral, resp. rate 16, height 6' (1.829 m), weight 72.6 kg (160 lb 0.9 oz), SpO2 100 %.Body mass index is 21.71 kg/m.  General Appearance: Casual  Eye Contact:  Fair  Speech:  Clear and Coherent  Volume:  Normal  Mood:  Euthymic  Affect:  Appropriate  Thought Process:  Coherent and Goal Directed  Orientation:  Full (Time, Place, and Person)  Thought Content:  Logical  Suicidal Thoughts:  No  Homicidal Thoughts:  No  Memory:  Immediate;   Fair  Judgement:  Fair  Insight:  Lacking  Psychomotor Activity:  Normal  Concentration:  Concentration: Fair  Recall:  [employer] of Knowledge:  Fair  Language:  Fair  Akathisia:  No      Assets:  Resilience  ADL's:  Intact  Cognition:  WNL  Sleep:  Number of Hours: 6     Treatment Plan Summary: 62 yo male admitted due to psychosis. He has been on oral Haldol and tolerating it well. Symptoms are improving and much less delusional. He has not been violent or agitated at all.   Plan:  Schizoaffective disorder -continue oral Haldol 5 mg BID. He will be given Haldol decanoate 100 mg tomorrow -Kirt Boys due to on 4/23 -Continue Depakote 500 mg qam and 1000 mg qhs.  Dispo -He will return home on discharge and follow up with ACT team  Marylin Crosby, MD 09/18/2017, 2:17 PM

## 2017-09-18 NOTE — Progress Notes (Signed)
D: . Attempted to attend activities on the unit  continue to work on coping skills Decreased anxiety. Voice of no issues around constipation. Patient received  information from  staff in concrete form  for better understanding  Emotional and mental status improved  Patient stated slept good last night .Stated appetite is good   No pain concerns . Appropriate ADL'S. Interacting with peers and staff.  A: Encourage patient participation with unit programming . Instruction  Given on  Medication , verbalize understanding. R: Voice no other concerns. Staff continue to monitor

## 2017-09-18 NOTE — BHH Group Notes (Signed)
09/18/2017 1PM  Type of Therapy/Topic:  Group Therapy:  Feelings about Diagnosis  Participation Level:  Did Not Attend   Description of Group:   This group will allow patients to explore their thoughts and feelings about diagnoses they have received. Patients will be guided to explore their level of understanding and acceptance of these diagnoses. Facilitator will encourage patients to process their thoughts and feelings about the reactions of others to their diagnosis and will guide patients in identifying ways to discuss their diagnosis with significant others in their lives. This group will be process-oriented, with patients participating in exploration of their own experiences, giving and receiving support, and processing challenge from other group members.   Therapeutic Goals: 1. Patient will demonstrate understanding of diagnosis as evidenced by identifying two or more symptoms of the disorder 2. Patient will be able to express two feelings regarding the diagnosis 3. Patient will demonstrate their ability to communicate their needs through discussion and/or role play  Summary of Patient Progress: Patient was encouraged and invited to attend group. Patient did not attend group. Social worker will continue to encourage group participation in the future.        Therapeutic Modalities:   Cognitive Behavioral Therapy Brief Therapy Feelings Identification    Darin Engels, [place] 09/18/2017 2:07 PM

## 2017-09-18 NOTE — Plan of Care (Signed)
.   Attempted to attend activities on the unit  continue to work on coping skills Decreased anxiety. Voice of no issues around constipation. Patient received  information from  staff in concrete form  for better understanding Emotional and mental status improved    Problem: Education: Goal: Knowledge of [facility] Education information/materials will improve Outcome: Progressing Goal: Emotional status will improve Outcome: Progressing Goal: Mental status will improve Outcome: Progressing   Problem: Activity: Goal: Interest or engagement in activities will improve Outcome: Progressing   Problem: Coping: Goal: Ability to identify and develop effective coping behavior will improve Outcome: Progressing   Problem: Self-Concept: Goal: Will verbalize positive feelings about self Outcome: Progressing   Problem: Coping: Goal: Level of anxiety will decrease Outcome: Progressing   Problem: Elimination: Goal: Will not experience complications related to bowel motility Outcome: Progressing

## 2017-09-18 NOTE — Plan of Care (Addendum)
Patient found in day room upon my arrival. Patient is visible and social throughout the evening. Mood and affect are happy and bright. Denies SI/HI/AVH. Patient speech is more appropriate. No bizarre ideation at this time. Patient feels ready for discharge. Denies pain. Reports eating and voiding adequately. Compliant with HS medication and staff direction. Trazodone given for sleep with positive results. Q 15 minute checks maintained. Will continue to monitor throughout the shift. Patient slept 6.5 hours. No apparent distress. First set of VS were elevated. WNL upon recheck. Will endorse care to oncoming shift.  Problem: Education: Goal: Knowledge of [place] [facility] Education information/materials will improve Outcome: Progressing Goal: Emotional status will improve Outcome: Progressing Goal: Mental status will improve Outcome: Progressing   Problem: Activity: Goal: Interest or engagement in activities will improve Outcome: Progressing   Problem: Coping: Goal: Ability to identify and develop effective coping behavior will improve Outcome: Progressing   Problem: Self-Concept: Goal: Will verbalize positive feelings about self Outcome: Progressing   Problem: Coping: Goal: Level of anxiety will decrease Outcome: Progressing   Problem: Elimination: Goal: Will not experience complications related to bowel motility Outcome: Progressing

## 2017-09-18 NOTE — Plan of Care (Signed)
  Problem: Education: Goal: Knowledge of [facility] Education information/materials will improve Outcome: Progressing   Problem: Education: Goal: Emotional status will improve Outcome: Progressing   Problem: Activity: Goal: Interest or engagement in activities will improve Outcome: Progressing

## 2017-09-18 NOTE — BHH Group Notes (Signed)
[facility] Group Notes:  (Nursing/MHT/Case Management/Adjunct)  Date:  09/18/2017  Time:  11:19 PM  Type of Therapy:  Group Therapy  Participation Level:  Active  Participation Quality:  Appropriate  Affect:  Appropriate  Cognitive:  Alert  Insight:  Appropriate  Engagement in Group:  Engaged  Modes of Intervention:  Support  Summary of Progress/Problems:  Dustin [name] 09/18/2017, 11:19 PM

## 2017-09-18 NOTE — Progress Notes (Signed)
Recreation Therapy Notes  Date: 09/18/2017  Time: 9:30 am   Location: Craft Room   Behavioral response: N/A   Intervention Topic: Coping Skills   Discussion/Intervention: Patient did not attend group.   Clinical Observations/Feedback:  Patient did not attend group.   Ronelle Michie LRT/CTRS        Read [place] 09/18/2017 11:46 AM

## 2017-09-18 NOTE — BHH Group Notes (Signed)
  09/18/2017  Time: 0900  Type of Therapy and Topic: Group Therapy: Goals Group: SMART Goals   Participation Level:  Did Not Attend   Description of Group:   The purpose of a daily goals group is to assist and guide patients in setting recovery/wellness-related goals. The objective is to set goals as they relate to the crisis in which they were admitted. Patients will be using SMART goal modalities to set measurable goals. Characteristics of realistic goals will be discussed and patients will be assisted in setting and processing how one will reach their goal. Facilitator will also assist patients in applying interventions and coping skills learned in psycho-education groups to the SMART goal and process how one will achieve defined goal.   Therapeutic Goals:  -Patients will develop and document one goal related to or their crisis in which brought them into treatment.  -Patients will be guided by LCSW using SMART goal setting modality in how to set a measurable, attainable, realistic and time sensitive goal.  -Patients will process barriers in reaching goal.  -Patients will process interventions in how to overcome and successful in reaching goal.   Patient's Goal:  Pt was invited to attend group but chose not to attend. CSW will continue to encourage pt to attend group throughout their admission.   Therapeutic Modalities:  Motivational Interviewing  Cognitive Behavioral Therapy  Crisis Intervention Model  SMART goals setting  Alden Hipp, MSW, LCSW Clinical Social Worker 09/18/2017 10:06 AM

## 2017-09-19 NOTE — BHH Suicide Risk Assessment (Signed)
[facility] Discharge Suicide Risk Assessment   Principal Problem: Paranoid schizophrenia [facility]) Discharge Diagnoses:  Patient Active Problem List   Diagnosis Date Noted  . Paranoid schizophrenia ([place]) [F20.0] 09/03/2017    Priority: High  . Schizoaffective disorder, bipolar type ([place]) [F25.0] 01/24/2017    Priority: High  . Tardive dyskinesia [G24.01] 01/24/2017  . Tobacco use disorder [F17.200] 11/29/2016      Mental Status Per Nursing Assessment::   On Admission:  NA  Demographic Factors:  Male and Unemployed  Loss Factors: Decrease in vocational status  Historical Factors: Impulsivity  Risk Reduction Factors:   Living with another person, especially a relative and Positive social support  Continued Clinical Symptoms:  Schizophrenia:   Paranoid or undifferentiated type  Cognitive Features That Contribute To Risk:  None    Suicide Risk:  Minimal: No identifiable suicidal ideation.    Follow-up Information    [place].. Go on 09/24/2017.   Why:  Please follow up with your ACTT team in the office on Monday 09/24/2017 at 10am. Thank you. Contact information: [place] [address] [phone]           Plan Of Care/Follow-up recommendations: Follow up with ACT team  Marylin Crosby, MD 09/19/2017, 12:38 PM

## 2017-09-19 NOTE — Progress Notes (Signed)
Recreation Therapy Notes  Date: 09/19/2017  Time: 9:30 am  Location: Craft Room  Behavioral response: Appropriate  Intervention Topic: Communication\   Discussion/Intervention:  Group content today was focused on communication. The group defined communication and ways to communicate with others. Individuals stated reason why communication is important and some reasons to communicate with others. Patients expressed if they thought they were good at communicating with others and ways they could improve their communication skills. The group identified important parts of communication and some experiences they have had in the past with communication. The group participated in the intervention "Board Scramble", where patients had a chance to test out their communication skills and identify ways to improve their communication techniques.  Clinical Observations/Feedback:  Patient came to group and was focused on what peers and staff had to say about communication. Individuals participated in the intervention and was social with peers and staff during group. He left group early due to unknown reasons.  Carina Chaplin LRT/CTRS         Sarah-Jane Nazario 09/19/2017 11:21 AM

## 2017-09-19 NOTE — Discharge Summary (Signed)
Physician Discharge Summary Note  Patient:  Dustin [name] is an 62 y.o., male MRN:  [id number] DOB:  Jul 28, 1955 Patient phone:  [phone] (home)  Patient address:   [place] [address],  Total Time spent with patient: 20 minutes  Plus 20 minutes of medication reconciliation, discharge planning, and discharge documentation   Date of Admission:  09/03/2017 Date of Discharge: 09/19/17  Reason for Admission:  Psychosis, Agitation  Principal Problem: Paranoid schizophrenia [facility]) Discharge Diagnoses: Patient Active Problem List   Diagnosis Date Noted  . Paranoid schizophrenia ([facility]) [F20.0] 09/03/2017    Priority: High  . Schizoaffective disorder, bipolar type ([place]) [F25.0] 01/24/2017    Priority: High  . Tardive dyskinesia [G24.01] 01/24/2017  . Tobacco use disorder [F17.200] 11/29/2016    Past Psychiatric History: See H&P  Past Medical History:  Past Medical History:  Diagnosis Date  . Schizophrenia ([place])    History reviewed. No pertinent surgical history. Family History: History reviewed. No pertinent family history. Family Psychiatric  History: See h&P Social History:  Social History   Substance and Sexual Activity  Alcohol Use Yes  . Alcohol/week: 4.2 oz  . Types: 7 Shots of liquor per week   Comment: 1 drink every night- gin     Social History   Substance and Sexual Activity  Drug Use No    Social History   Socioeconomic History  . Marital status: Single    Spouse name: Not on file  . Number of children: Not on file  . Years of education: Not on file  . Highest education level: Not on file  Occupational History  . Not on file  Social Needs  . Financial resource strain: Not on file  . Food insecurity:    Worry: Not on file    Inability: Not on file  . Transportation needs:    Medical: Not on file    Non-medical: Not on file  Tobacco Use  . Smoking status: Current Every Day Smoker    Packs/day: 0.50    Years: 15.00    Pack  years: 7.50  . Smokeless tobacco: Never Used  Substance and Sexual Activity  . Alcohol use: Yes    Alcohol/week: 4.2 oz    Types: 7 Shots of liquor per week    Comment: 1 drink every night- gin  . Drug use: No  . Sexual activity: Never    Birth control/protection: None  Lifestyle  . Physical activity:    Days per week: Not on file    Minutes per session: Not on file  . Stress: Not on file  Relationships  . Social connections:    Talks on phone: Not on file    Gets together: Not on file    Attends religious service: Not on file    Active member of club or organization: Not on file    Attends meetings of clubs or organizations: Not on file    Relationship status: Not on file  Other Topics Concern  . Not on file  Social History Narrative  . Not on file    Hospital Course:  Pt was started on oral haldol and transitioned to haldol Decanoate. He was given first Decanoate injection 100 mg on 09/19/17. Depakote was increased to 500 mg qam and 1000 mg qhs. Depakote level on 09/09/17 was 75. Pt tolerated medications well. BP remained stable.  He tolerated Haldol well and stated that it was actually helping him sleep. He did not  have any EPS on exam. On day of discharge, he was much more organized in conversation. He did not have any delusional or bizzare thought content for the past several days which he usually has at baseline. He was showing better insight into taking his medications. He agreed to get his Invega injection when it is due in the next few days. He talked appropriately about paying his bills and getting his cable set up in his bedroom. He had no episodes of agitation or irritable during entire hospitalization. Speech was much clearer.  The past few days he was socializing and laughing with peers outside in the courtyard. He consistently Denied SI, HI, AH, VH. He agreed to follow up with ACT team.    Physical Findings: AIMS: Facial and Oral Movements Muscles of Facial Expression:  None, normal Lips and Perioral Area: None, normal Jaw: None, normal Tongue: None, normal,Extremity Movements Upper (arms, wrists, hands, fingers): None, normal Lower (legs, knees, ankles, toes): None, normal, Trunk Movements Neck, shoulders, hips: None, normal, Overall Severity Severity of abnormal movements (highest score from questions above): None, normal Incapacitation due to abnormal movements: None, normal Patient's awareness of abnormal movements (rate only patient's report): No Awareness, Dental Status Current problems with teeth and/or dentures?: No Does patient usually wear dentures?: No  CIWA:    COWS:     Musculoskeletal: Strength & Muscle Tone: within normal limits Gait & Station: normal Patient leans: Normal  Psychiatric Specialty Exam: Physical Exam  ROS  Blood pressure 100/83, pulse 90, temperature 98.6 F (37 C), temperature source Oral, resp. rate 18, height 6' (1.829 m), weight 72.6 kg (160 lb 0.9 oz), SpO2 100 %.Body mass index is 21.71 kg/m.  General Appearance: Casual  Eye Contact:  Good  Speech:  Clear and Coherent  Volume:  Normal  Mood:  Euthymic  Affect:  Congruent  Thought Process:  Coherent and Goal Directed  Orientation:  Full (Time, Place, and Person)  Thought Content:  Logical  Suicidal Thoughts:  No  Homicidal Thoughts:  No  Memory:  Immediate;   Fair  Judgement:  Impaired  Insight:  Lacking  Psychomotor Activity:  Normal  Concentration:  Concentration: Fair  Recall:  [place] of Knowledge:  Fair  Language:  Fair  Akathisia:  No      Assets:  Resilience  ADL's:  Intact  Cognition:  WNL  Sleep:  Number of Hours: 6.5        Has this patient used any form of tobacco in the last 30 days? (Cigarettes, Smokeless Tobacco, Cigars, and/or Pipes) No  Blood Alcohol level:  Lab Results  Component Value Date   ETH <10 08/31/2017   ETH <10 93/81/0175    Metabolic Disorder Labs:  Lab Results  Component Value Date   HGBA1C 5.4  08/17/2017   MPG 108.28 08/17/2017   MPG 105.41 01/12/2017   No results found for: PROLACTIN Lab Results  Component Value Date   CHOL 131 08/17/2017   TRIG 111 08/17/2017   HDL 46 08/17/2017   CHOLHDL 2.8 08/17/2017   VLDL 22 08/17/2017   LDLCALC 63 08/17/2017   [place] 88 01/12/2017    See Psychiatric Specialty Exam and Suicide Risk Assessment completed by Attending Physician prior to discharge.  Discharge destination:  Home  Is patient on multiple antipsychotic therapies at discharge:  Yes,   Do you recommend tapering to monotherapy for antipsychotics?  No   Has Patient had three or more failed trials of antipsychotic monotherapy by history:  Yes,   Antipsychotic medications that previously failed include:   1.  Abilify, Invega, Prolixin.  Recommended Plan for Multiple Antipsychotic Therapies: Additional reason(s) for multiple antispychotic treatment:  Failed single agent, quickly decompensates and becomes agitated when on one anti-psychotic  Discharge Instructions    Increase activity slowly   Complete by:  As directed      Allergies as of 09/19/2017   No Known Allergies     Medication List    TAKE these medications     Indication  divalproex 500 MG DR tablet Commonly known as:  DEPAKOTE Take 1 tablet (500 mg total) by mouth See admin instructions. Take 1 tablet in the morning and 2 tablets at bedtime 500 mg qam and 1000 mg qpm What changed:    when to take this  additional instructions  Indication:  schizoaffective bipolar type   haloperidol 5 MG tablet Commonly known as:  HALDOL Take 1 tablet (5 mg total) by mouth 2 (two) times daily.  Indication:  Schizophrenia   haloperidol decanoate 100 MG/ML injection Commonly known as:  HALDOL DECANOATE Inject 1 mL (100 mg total) into the muscle once for 1 dose. Next dose due 5/15 Start taking on:  10/17/2017  Indication:  Schizophrenia   multivitamin tablet Take 1 tablet by mouth daily.  Indication:  Vitamin    OMEGA-3 FISH OIL PO Take 1 capsule by mouth daily.  Indication:  Omega 3   paliperidone 234 MG/1.5ML Susp injection Commonly known as:  INVEGA SUSTENNA Inject 234 mg into the muscle once for 1 dose. Next dose due 4/23 Start taking on:  09/25/2017 What changed:    additional instructions  These instructions start on 09/25/2017. If you are unsure what to do until then, ask your doctor or other care provider.  Indication:  Schizoaffective Disorder      Follow-up [place].. Go on 09/24/2017.   Why:  Please follow up with your ACTT team in the office on Monday 09/24/2017 at 10am. Thank you. Contact information: [place] [address] [phone]           Follow-up recommendations: ACT team  Comments:  Next Haldol Decanoate injection due 10/17/17, next Invega Injection due 09/25/17 (Could give 4 days early to be closer to Haldol injection each month)  Signed: Marylin Crosby, MD 09/19/2017, 9:14 AM

## 2017-09-19 NOTE — Progress Notes (Addendum)
D: Patient is aware of  Discharge this shift .Patient denies suicidal /homicidal ideations. Patient received all belongings brought in   A: No Storage medications. Writer reviewed Discharge Summary, Suicide Risk Assessment, and Transitional Record. Patient also received Prescriptions   from  MD.. Aware  Of follow up appointment .  R: Patient left unit with no questions  Or concerns  With brother

## 2017-09-19 NOTE — Progress Notes (Signed)
  [facility] Adult Case Management Discharge Plan :  Will you be returning to the same living situation after discharge:  Yes,  Home with family At discharge, do you have transportation home?: Yes,  Brother will come and pick him up Do you have the ability to pay for your medications: Yes,  Insurance  Release of information consent forms completed and in the chart;  Patient's signature needed at discharge.  Patient to Follow up at: Follow-up Information    [place].. Go on 09/24/2017.   Why:  Please follow up with your ACTT team in the office on Monday 09/24/2017 at 10am. Thank you. Contact information: [place] [address] [phone]           Next level of care provider has access to [place] and Suicide Prevention discussed: [place], [place], patients brother, [phone]      Has patient been referred to the Quitline?: Patient refused referral  Patient has been referred for addiction treatment: N/A  Darin Engels, LCSW 09/19/2017, 9:17 AM

## 2017-09-19 NOTE — BHH Group Notes (Signed)
LCSW Group Therapy Note  09/19/2017 1:00 pm  Type of Therapy/Topic:  Group Therapy:  Emotion Regulation  Participation Level:  None   Description of Group:    The purpose of this group is to assist patients in learning to regulate negative emotions and experience positive emotions. Patients will be guided to discuss ways in which they have been vulnerable to their negative emotions. These vulnerabilities will be juxtaposed with experiences of positive emotions or situations, and patients will be challenged to use positive emotions to combat negative ones. Special emphasis will be placed on coping with negative emotions in conflict situations, and patients will process healthy conflict resolution skills.  Therapeutic Goals: 1. Patient will identify two positive emotions or experiences to reflect on in order to balance out negative emotions 2. Patient will label two or more emotions that they find the most difficult to experience 3. Patient will demonstrate positive conflict resolution skills through discussion and/or role plays  Summary of Patient Progress:  Delwin only stayed in group approximately five minutes before walking out.     Therapeutic Modalities:   Cognitive Behavioral Therapy Feelings Identification Dialectical Behavioral Therapy

## 2017-09-20 NOTE — Progress Notes (Signed)
Recreation Therapy Notes  INPATIENT RECREATION TR PLAN  Patient Details Name: Dustin PRUDEN MRN: [id number] DOB: 08-Sep-1955 Today's Date: 09/20/2017  Rec Therapy Plan Is patient appropriate for Therapeutic Recreation?: Yes Treatment times per week: At least 3 Estimated Length of Stay: 5-7 days TR Treatment/Interventions: Group participation (Comment)  Discharge Criteria Pt will be discharged from therapy if:: Discharged Treatment plan/goals/alternatives discussed and agreed upon by:: Patient/family  Discharge Summary Short term goals set: Patient will engage in interactions with peers and staff in pro-social manner at least 2x within 5 recreation therapy group sessions Short term goals met: Not met Progress toward goals comments: Groups attended Which groups?: Communication Reason goals not met: Patient spent most of his time in his room Therapeutic equipment acquired: N/A Reason patient discharged from therapy: Discharge from hospital Pt/family agrees with progress & goals achieved: Yes Date patient discharged from therapy: 09/19/17   Zeriah Baysinger 09/20/2017, 8:46 AM

## 2018-01-23 ENCOUNTER — Encounter: Payer: Self-pay | Admitting: Family Medicine

## 2018-01-23 NOTE — Progress Notes (Deleted)
Patient: Dustin [name], Male    DOB: May 23, 1956, 62 y.o.   MRN: [id number] Visit Date: 01/23/2018  Today's Provider: Lavon Paganini, MD   I, Martha Clan, CMA, am acting as scribe for Lavon Paganini, MD.  No chief complaint on file.  Subjective:    Establish Care Dustin [name] is a 62 y.o. male who presents today as a new patient to establish care. He feels {DESC; WELL/FAIRLY WELL/POORLY:18703}. He reports exercising ***. He reports he is sleeping {DESC; WELL/FAIRLY WELL/POORLY:18703}.  -----------------------------------------------------------------   Review of Systems  Social History      He  reports that he has been smoking. He has a 7.50 pack-year smoking history. He has never used smokeless tobacco. He reports that he drinks about 7.0 standard drinks of alcohol per week. He reports that he does not use drugs.       Social History   Socioeconomic History  . Marital status: Single    Spouse name: Not on file  . Number of children: Not on file  . Years of education: Not on file  . Highest education level: Not on file  Occupational History  . Not on file  Social Needs  . Financial resource strain: Not on file  . Food insecurity:    Worry: Not on file    Inability: Not on file  . Transportation needs:    Medical: Not on file    Non-medical: Not on file  Tobacco Use  . Smoking status: Current Every Day Smoker    Packs/day: 0.50    Years: 15.00    Pack years: 7.50  . Smokeless tobacco: Never Used  Substance and Sexual Activity  . Alcohol use: Yes    Alcohol/week: 7.0 standard drinks    Types: 7 Shots of liquor per week    Comment: 1 drink every night- gin  . Drug use: No  . Sexual activity: Never    Birth control/protection: None  Lifestyle  . Physical activity:    Days per week: Not on file    Minutes per session: Not on file  . Stress: Not on file  Relationships  . Social connections:    Talks on phone: Not on file    Gets together: Not  on file    Attends religious service: Not on file    Active member of club or organization: Not on file    Attends meetings of clubs or organizations: Not on file    Relationship status: Not on file  Other Topics Concern  . Not on file  Social History Narrative  . Not on file    Past Medical History:  Diagnosis Date  . Schizophrenia [facility])      Patient Active Problem List   Diagnosis Date Noted  . Paranoid schizophrenia ([place]) 09/03/2017  . Schizoaffective disorder, bipolar type ([place]) 01/24/2017  . Tardive dyskinesia 01/24/2017  . Tobacco use disorder 11/29/2016    No past surgical history on file.  Family History        No family status information on file.        His family history is not on file.      No Known Allergies   Current Outpatient Medications:  .  divalproex (DEPAKOTE) 500 MG DR tablet, Take 1 tablet (500 mg total) by mouth See admin instructions. Take 1 tablet in the morning and 2 tablets at bedtime 500 mg qam and 1000 mg qpm, Disp: 90 tablet, Rfl: 0 .  haloperidol (HALDOL) 5 MG tablet, Take 1 tablet (5 mg total) by mouth 2 (two) times daily., Disp: 60 tablet, Rfl: 0 .  haloperidol decanoate (HALDOL DECANOATE) 100 MG/ML injection, Inject 1 mL (100 mg total) into the muscle once for 1 dose. Next dose due 5/15, Disp: 1 mL, Rfl:  .  Multiple Vitamin (MULTIVITAMIN) tablet, Take 1 tablet by mouth daily., Disp: , Rfl:  .  Omega-3 Fatty Acids (OMEGA-3 FISH OIL PO), Take 1 capsule by mouth daily., Disp: , Rfl:  .  paliperidone (INVEGA SUSTENNA) 234 MG/1.5ML SUSP injection, Inject 234 mg into the muscle once for 1 dose. Next dose due 4/23, Disp: 1.5 mL, Rfl: 0   Patient Care Team: Kirk Ruths, MD as PCP - General (Internal Medicine)      Objective:   Vitals: There were no vitals taken for this visit.  There were no vitals filed for this visit.   Physical Exam   Depression Screen No flowsheet data found.    Assessment & Plan:     Routine  Health Maintenance and Physical Exam  Exercise Activities and Dietary recommendations Goals   None      There is no immunization history on file for this patient.  Health Maintenance  Topic Date Due  . Hepatitis C Screening  [birth year]  . HIV Screening  03/05/1971  . TETANUS/TDAP  03/05/1975  . COLONOSCOPY  03/04/2006  . INFLUENZA VACCINE  01/03/2018     Discussed health benefits of physical activity, and encouraged him to engage in regular exercise appropriate for his age and condition.    --------------------------------------------------------------------

## 2018-01-24 NOTE — Progress Notes (Signed)
This encounter was created in error - please disregard.

## 2018-07-11 ENCOUNTER — Emergency Department (EMERGENCY_DEPARTMENT_HOSPITAL)
Admission: EM | Admit: 2018-07-11 | Discharge: 2018-07-12 | Disposition: A | Discharge status: Other Acute Inpatient Hospital | Payer: Medicare Other | Source: Home / Self Care | Attending: Emergency Medicine | Admitting: Emergency Medicine

## 2018-07-11 DIAGNOSIS — Z79899 Other long term (current) drug therapy: Secondary | ICD-10-CM | POA: Insufficient documentation

## 2018-07-11 DIAGNOSIS — F17200 Nicotine dependence, unspecified, uncomplicated: Secondary | ICD-10-CM | POA: Insufficient documentation

## 2018-07-11 DIAGNOSIS — F250 Schizoaffective disorder, bipolar type: Secondary | ICD-10-CM | POA: Diagnosis not present

## 2018-07-11 DIAGNOSIS — G2401 Drug induced subacute dyskinesia: Secondary | ICD-10-CM | POA: Diagnosis present

## 2018-07-11 DIAGNOSIS — F200 Paranoid schizophrenia: Secondary | ICD-10-CM | POA: Diagnosis not present

## 2018-07-11 DIAGNOSIS — F209 Schizophrenia, unspecified: Secondary | ICD-10-CM | POA: Insufficient documentation

## 2018-07-11 LAB — CBC
HEMATOCRIT: 44.7 % (ref 39.0–52.0)
Hemoglobin: 14.5 g/dL (ref 13.0–17.0)
MCH: 27.1 pg (ref 26.0–34.0)
MCHC: 32.4 g/dL (ref 30.0–36.0)
MCV: 83.4 fL (ref 80.0–100.0)
Platelets: 188 10*3/uL (ref 150–400)
RBC: 5.36 MIL/uL (ref 4.22–5.81)
RDW: 13.6 % (ref 11.5–15.5)
WBC: 7.7 10*3/uL (ref 4.0–10.5)
nRBC: 0 % (ref 0.0–0.2)

## 2018-07-11 LAB — COMPREHENSIVE METABOLIC PANEL
ALBUMIN: 3.9 g/dL (ref 3.5–5.0)
ALT: 8 U/L (ref 0–44)
AST: 14 U/L — AB (ref 15–41)
Alkaline Phosphatase: 65 U/L (ref 38–126)
Anion gap: 7 (ref 5–15)
BUN: 9 mg/dL (ref 8–23)
CHLORIDE: 110 mmol/L (ref 98–111)
CO2: 22 mmol/L (ref 22–32)
Calcium: 9.1 mg/dL (ref 8.9–10.3)
Creatinine, Ser: 1.26 mg/dL — ABNORMAL HIGH (ref 0.61–1.24)
GFR calc Af Amer: >60 mL/min (ref >60)
GFR calc non Af Amer: >60 mL/min (ref >60)
Glucose, Bld: 134 mg/dL — ABNORMAL HIGH (ref 70–99)
POTASSIUM: 3.4 mmol/L — AB (ref 3.5–5.1)
Sodium: 139 mmol/L (ref 135–145)
TOTAL PROTEIN: 7.2 g/dL (ref 6.5–8.1)
Total Bilirubin: 0.4 mg/dL (ref 0.3–1.2)

## 2018-07-11 LAB — SALICYLATE LEVEL: Salicylate Lvl: <7 mg/dL (ref 2.8–30.0)

## 2018-07-11 LAB — ETHANOL

## 2018-07-11 LAB — ACETAMINOPHEN LEVEL: Acetaminophen (Tylenol), Serum: <10 ug/mL — ABNORMAL LOW (ref 10–30)

## 2018-07-11 MED ORDER — ADULT MULTIVITAMIN W/MINERALS CH
1.0000 | ORAL_TABLET | Freq: Every day | ORAL | Status: DC
  Administered 2018-07-12: 1 via ORAL
  Filled 2018-07-11: qty 1

## 2018-07-11 MED ORDER — OMEGA-3-ACID ETHYL ESTERS 1 G PO CAPS
1.0000 | ORAL_CAPSULE | Freq: Every day | ORAL | Status: DC
  Administered 2018-07-11 – 2018-07-12 (×2): 1 g via ORAL
  Filled 2018-07-11 (×4): qty 1

## 2018-07-11 MED ORDER — DIVALPROEX SODIUM 500 MG PO DR TAB
500.0000 mg | DELAYED_RELEASE_TABLET | ORAL | Status: DC
  Administered 2018-07-12: 500 mg via ORAL
  Filled 2018-07-11: qty 1

## 2018-07-11 MED ORDER — DIPHENHYDRAMINE HCL 50 MG/ML IJ SOLN: 50.0000 mg | Freq: Four times a day (QID) | INTRAMUSCULAR | Status: DC | PRN

## 2018-07-11 MED ORDER — HALOPERIDOL DECANOATE 100 MG/ML IM SOLN: 100.0000 mg | Freq: Once | INTRAMUSCULAR | Status: DC

## 2018-07-11 MED ORDER — HALOPERIDOL 5 MG PO TABS
5.0000 mg | ORAL_TABLET | Freq: Two times a day (BID) | ORAL | Status: DC
  Administered 2018-07-11 – 2018-07-12 (×2): 5 mg via ORAL
  Filled 2018-07-11 (×2): qty 1

## 2018-07-11 MED ORDER — DIPHENHYDRAMINE HCL 25 MG PO CAPS
50.0000 mg | ORAL_CAPSULE | Freq: Four times a day (QID) | ORAL | Status: DC | PRN
  Administered 2018-07-11: 50 mg via ORAL
  Filled 2018-07-11: qty 2

## 2018-07-11 MED ORDER — DIVALPROEX SODIUM 500 MG PO DR TAB
1000.0000 mg | DELAYED_RELEASE_TABLET | Freq: Every evening | ORAL | Status: DC
  Administered 2018-07-11: 1000 mg via ORAL
  Filled 2018-07-11: qty 2

## 2018-07-11 NOTE — ED Notes (Signed)
IVC PENDING  CONSULT ?

## 2018-07-11 NOTE — ED Triage Notes (Signed)
Pt is supposed to get an injection but he refused today. Pt states he has pills in him that are note supposed to be in him.

## 2018-07-11 NOTE — ED Notes (Signed)

## 2018-07-11 NOTE — ED Notes (Signed)
Hourly rounding reveals patient in room. No complaints, stable, in no acute distress. Q15 minute rounds and monitoring via [place] and Officer to continue.   

## 2018-07-11 NOTE — ED Notes (Addendum)
The patient was dressed out into the required purple scrubs. He belongings were placed into a white patient belongings bag and labeled properly. Black shoes, black socks, gray underwear, blue shirt, jeans. Clear bag containing personal items was given to the officer at the quad for placement in the house safe.

## 2018-07-11 NOTE — ED Notes (Signed)
Pt states  "I cannot hear well but I have these microphones in my teeth and they help me hear what you are saying - I am alright - I have a history of bipolar and schizophrenia but today that [place] woman came to my house and she wanted to give me my shots - the abilify and the haldol but it has not been 30 days since I had them so I told her   No  - she went to the sheriff and they brought me here - ma'am  It has not been 30 days since I had my shots"  Pt then started talking about going to the "root doctor" with his cousin when he was young and the "root doctor" could not find anything wrong with him

## 2018-07-11 NOTE — ED Triage Notes (Signed)
Pt in via ASD. Pt here under IVC. Pt speaking about an injection.

## 2018-07-11 NOTE — ED Notes (Signed)
Pt. Transferred to [place] from ED to room 4 after screening for contraband. Report to include Situation, Background, Assessment and Recommendations from [place]. Pt. Oriented to unit including Q15 minute rounds as well as the security cameras for their protection. Patient is alert and oriented, warm and dry in no acute distress. Patient denies SI, HI, and AVH. Pt. Encouraged to let me know if needs arise.

## 2018-07-11 NOTE — ED Notes (Signed)
BEHAVIORAL HEALTH ROUNDING Patient sleeping: No. Patient alert and oriented: yes Behavior appropriate: Yes.  ; If no, describe:  Nutrition and fluids offered: yes Toileting and hygiene offered: Yes  Sitter present: q15 minute observations and security  monitoring Law enforcement present: Yes  ODS  

## 2018-07-11 NOTE — Progress Notes (Signed)
   07/11/18 1812  Clinical Encounter Type  Visited With Patient  Visit Type Initial  Referral From Nurse  Consult/Referral To Chaplain  Spiritual Encounters  Spiritual Needs Emotional  CH received page at 1812 to visit patient in [place]. Upon arrival, patient was resting on bed but stood up and greeted [facility]. Able to ambulate around room with ease. Not combative.  Engaged [place] in conversation about the Bible and other other forms of spirituality which were important to him. Seemed frustrated when [facility] shared he was not familiar with other spiritual practices mentioned. CH was a non-judgmental presence. Validated emotions and feelings.

## 2018-07-11 NOTE — ED Provider Notes (Signed)
[facility] Emergency Department Provider Note ____________________________________________   First MD Initiated Contact with Patient 07/11/18 1511     (approximate)  I have reviewed the triage vital signs and the nursing notes.   HISTORY  Chief Complaint Mental Health Problem    HPI Dustin [name] is a 63 y.o. male with PMH as noted below including a history of schizophrenia who presents under involuntary commitment after he apparently declined his monthly injection.  The patient states that he already got an injection of his Abilify and Haldol earlier this month.  He declines any acute symptoms.  Past Medical History:  Diagnosis Date  . Schizophrenia [facility])     Patient Active Problem List   Diagnosis Date Noted  . Paranoid schizophrenia ([place]) 09/03/2017  . Schizoaffective disorder, bipolar type ([place]) 01/24/2017  . Tardive dyskinesia 01/24/2017  . Tobacco use disorder 11/29/2016    No past surgical history on file.  Prior to Admission medications   Medication Sig Start Date End Date Taking? Authorizing Provider  divalproex (DEPAKOTE) 500 MG DR tablet Take 1 tablet (500 mg total) by mouth See admin instructions. Take 1 tablet in the morning and 2 tablets at bedtime 500 mg qam and 1000 mg qpm 09/18/17   [name], [name], MD  haloperidol (HALDOL) 5 MG tablet Take 1 tablet (5 mg total) by mouth 2 (two) times daily. 09/18/17   [name], [name], MD  haloperidol decanoate (HALDOL DECANOATE) 100 MG/ML injection Inject 1 mL (100 mg total) into the muscle once for 1 dose. Next dose due 5/15 10/17/17 10/17/17  [name], [name], MD  Multiple Vitamin (MULTIVITAMIN) tablet Take 1 tablet by mouth daily.    [provider]  Omega-3 Fatty Acids (OMEGA-3 FISH OIL PO) Take 1 capsule by mouth daily.    [provider]  paliperidone (INVEGA SUSTENNA) 234 MG/1.5ML SUSP injection Inject 234 mg into the muscle once for 1 dose. Next dose due 4/23 09/25/17 09/25/17   [name], [name], MD    Allergies Patient has no known allergies.  No family history on file.  Social History Social History   Tobacco Use  . Smoking status: Current Every Day Smoker    Packs/day: 0.50    Years: 15.00    Pack years: 7.50  . Smokeless tobacco: Never Used  Substance Use Topics  . Alcohol use: Yes    Alcohol/week: 7.0 standard drinks    Types: 7 Shots of liquor per week    Comment: 1 drink every night- gin  . Drug use: No    Review of Systems  Constitutional: No fever. Eyes: No visual changes. ENT: No sore throat. Cardiovascular: Denies chest pain. Respiratory: Denies shortness of breath. Gastrointestinal: No vomiting or diarrhea.  Genitourinary: Negative for flank pain.  Musculoskeletal: Negative for back pain. Skin: Negative for rash. Neurological: Negative for headache.   ____________________________________________   PHYSICAL EXAM:  VITAL SIGNS: ED Triage Vitals  Enc Vitals Group     BP 07/11/18 1427 (!) 137/117     Pulse Rate 07/11/18 1427 (!) 117     Resp 07/11/18 1427 20     Temp 07/11/18 1427 98.7 F (37.1 C)     Temp src --      SpO2 07/11/18 1427 98 %     Weight 07/11/18 1423 147 lb (66.7 kg)     Height 07/11/18 1423 6\' 2"  (1.88 m)     Head Circumference --      Peak Flow --  Pain Score 07/11/18 1423 0     Pain Loc --      Pain Edu? --      Excl. in [place]? --     Constitutional: Alert and oriented. Well appearing and in no acute distress. Eyes: Conjunctivae are normal.  Head: Atraumatic. Nose: No congestion/rhinnorhea. Mouth/Throat: Mucous membranes are moist.   Neck: Normal range of motion.  Cardiovascular: Good peripheral circulation. Respiratory: Normal respiratory effort.   Gastrointestinal: No distention.  Musculoskeletal: Extremities warm and well perfused.  Neurologic:  Normal speech and language. No gross focal neurologic deficits are appreciated.  Skin:  Skin is warm and dry. No rash noted. Psychiatric: Calm  and cooperative.  Disorganized and tangential thought, with delusions.  ____________________________________________   LABS (all labs ordered are listed, but only abnormal results are displayed)  Labs Reviewed  COMPREHENSIVE METABOLIC PANEL - Abnormal; Notable for the following components:      Result Value   Potassium 3.4 (*)    Glucose, Bld 134 (*)    Creatinine, Ser 1.26 (*)    AST 14 (*)    All other components within normal limits  ACETAMINOPHEN LEVEL - Abnormal; Notable for the following components:   Acetaminophen (Tylenol), Serum <10 (*)    All other components within normal limits  ETHANOL  SALICYLATE LEVEL  CBC  URINE DRUG SCREEN, QUALITATIVE ([facility] ONLY)   ____________________________________________  EKG   ____________________________________________  RADIOLOGY    ____________________________________________   PROCEDURES  Procedure(s) performed: No  Procedures  Critical Care performed: No ____________________________________________   INITIAL IMPRESSION / ASSESSMENT AND PLAN / ED COURSE  Pertinent labs & imaging results that were available during my care of the patient were reviewed by me and considered in my medical decision making (see chart for details).  63 year old male with PMH as noted above including a history of schizophrenia on monthly Abilify and Haldol presents under involuntary commitment after apparently declining his monthly injection today, although the patient states that he does not need it because he already got it at the beginning of the month.  He denies any medical complaints.  On exam, he was slightly tachycardic in triage.  His other vital signs are normal.  The patient is comfortable appearing and the remainder of the exam is unremarkable.  He does have quite disorganized and tangential thought and some delusions, such as there being a recording device in his tooth.  We will obtain psychiatric evaluation and disposition will  be per psych recommendations.  ----------------------------------------- 11:15 PM on 07/11/2018 -----------------------------------------  Per Dr. Leverne Humbles, patient was ordered medication and will be observed overnight since he is relatively disorganized.  He will be reevaluated by psychiatry in the morning to determine appropriate disposition. ____________________________________________   FINAL CLINICAL IMPRESSION(S) / ED DIAGNOSES  Final diagnoses:  Schizophrenia, unspecified type ([place])      NEW MEDICATIONS STARTED DURING THIS VISIT:  New Prescriptions   No medications on file     Note:  This document was prepared using Dragon voice recognition software and may include unintentional dictation errors.    Arta Silence, MD 07/11/18 2315

## 2018-07-11 NOTE — ED Notes (Signed)
Report to include Situation, Background, Assessment, and Recommendations received from RN. Patient alert and oriented, warm and dry, in no acute distress. Patient denies SI, HI, AVH and pain. Patient made aware of Q15 minute rounds and security cameras for their safety. Patient instructed to come to me with needs or concerns.  

## 2018-07-11 NOTE — ED Notes (Signed)
Hourly rounding reveals patient in room. No complaints, stable, in no acute distress. Q15 minute rounds and monitoring via Security Cameras to continue. 

## 2018-07-11 NOTE — ED Notes (Signed)
Hourly rounding reveals patient sleeping in room. No complaints, stable, in no acute distress. Q15 minute rounds and monitoring via Security Cameras to continue. 

## 2018-07-11 NOTE — BH Assessment (Signed)
Assessment Note  Dustin [name] is an 63 y.o. male who presents to ED requesting to get his Haldol injection. He reports he is unable to remember if he received his injection this month. According to IVC paperwork, pt refused his Haldol injection when his ACT Team arrived to his home to administer his medication. Per IVC paperwork, pt was making bizarre comments prior to being picked up by [facility]. Pt was somewhat disorganized in his thought process and tangential in his speech. He reports he has a "hearing aid in my teeth". Pt denied SI/HI/AVH. He is currently linked with [name]. Pt reports his family has had his committed due to not taking his medications in the past. Pt was alert and oriented to person while pleasant in his demeanor.    Diagnosis: Schizophrenia, by history  Past Medical History:  Past Medical History:  Diagnosis Date  . Schizophrenia ([place])     No past surgical history on file.  Family History: No family history on file.  Social History:  reports that he has been smoking. He has a 7.50 pack-year smoking history. He has never used smokeless tobacco. He reports current alcohol use of about 7.0 standard drinks of alcohol per week. He reports that he does not use drugs.  Additional Social History:  Alcohol / Drug Use Pain Medications: See PTA Prescriptions: See PTA Over the Counter: See PTA History of alcohol / drug use?: No history of alcohol / drug abuse Longest period of sobriety (when/how long): n/a Negative Consequences of Use: (N/A) Withdrawal Symptoms: (N/A)  CIWA: CIWA-Ar BP: (!) 137/117 Pulse Rate: (!) 117 COWS:    Allergies: No Known Allergies  Home Medications: (Not in a hospital admission)   OB/GYN Status:  No LMP for male patient.  General Assessment Data Location of Assessment: [facility] ED TTS Assessment: In system Is this a Tele or Face-to-Face Assessment?: Face-to-Face Is this an Initial Assessment or a Re-assessment for  this encounter?: Initial Assessment Patient Accompanied by:: N/A Language Other than English: No Living Arrangements: Other (Comment)(Private Living) What gender do you identify as?: Male Marital status: Single Maiden name: n/a Pregnancy Status: No Living Arrangements: Other relatives(Pt lives with his brother) Can pt return to current living arrangement?: Yes Admission Status: Involuntary Petitioner: Family member Is patient capable of signing voluntary admission?: No Referral Source: Self/Family/Friend Insurance type: Medicare  Medical Screening Exam ([place]) Medical Exam completed: Yes  Crisis Care Plan Living Arrangements: Other relatives(Pt lives with his brother) Legal Guardian: Other:(Self) Name of Psychiatrist: Physicist, medical (ACT Team) Name of Therapist: [name] (ACT Team)  Education Status Is patient currently in school?: No Is the patient employed, unemployed or receiving disability?: Receiving disability income  Risk to self with the past 6 months Suicidal Ideation: No Has patient been a risk to self within the past 6 months prior to admission? : No Suicidal Intent: No Has patient had any suicidal intent within the past 6 months prior to admission? : No Is patient at risk for suicide?: No Suicidal Plan?: No Has patient had any suicidal plan within the past 6 months prior to admission? : No Access to Means: No What has been your use of drugs/alcohol within the last 12 months?: None Reported Previous Attempts/Gestures: No How many times?: 0 Other Self Harm Risks: None Reported Triggers for Past Attempts: None known Intentional Self Injurious Behavior: None Family Suicide History: Yes(Pt reports his brother committed suicide) Recent stressful life event(s): Other (Comment)(Medication regimen)  Persecutory voices/beliefs?: No Depression: No Depression Symptoms: (None reported) Substance abuse history and/or treatment for substance abuse?:  No Suicide prevention information given to non-admitted patients: Not applicable  Risk to Others within the past 6 months Homicidal Ideation: No Does patient have any lifetime risk of violence toward others beyond the six months prior to admission? : No Thoughts of Harm to Others: No Current Homicidal Intent: No Current Homicidal Plan: No Access to Homicidal Means: No Identified Victim: None Reported History of harm to others?: No Assessment of Violence: None Noted Violent Behavior Description: None Reported Does patient have access to weapons?: No Criminal Charges Pending?: No Does patient have a court date: No Is patient on probation?: No  Psychosis Hallucinations: None noted Delusions: None noted  Mental Status Report Appearance/Hygiene: In scrubs Eye Contact: Good Motor Activity: Freedom of movement Speech: Tangential Level of Consciousness: Alert Mood: Pleasant Affect: Appropriate to circumstance Anxiety Level: Minimal Thought Processes: Tangential Judgement: Unimpaired Orientation: Person, Place, Time, Situation, Appropriate for developmental age Obsessive Compulsive Thoughts/Behaviors: None  Cognitive Functioning Concentration: Normal Memory: Recent Intact, Remote Intact Is patient IDD: No Insight: Good Impulse Control: Good Appetite: Good Have you had any weight changes? : No Change Sleep: No Change Total Hours of Sleep: 8 Vegetative Symptoms: None  ADLScreening [facility] Assessment Services) Patient's cognitive ability adequate to safely complete daily activities?: Yes Patient able to express need for assistance with ADLs?: Yes Independently performs ADLs?: Yes (appropriate for developmental age)  Prior Inpatient Therapy Prior Inpatient Therapy: Yes Prior Therapy Dates: Pt unable to recall dates Prior Therapy Facilty[facility]Provider([facility]): [facility] Reason for Treatment: Depression  Prior Outpatient Therapy Prior Outpatient Therapy: Yes Prior Therapy Dates:  Current Prior Therapy Facilty/Provider(s): [facility] Reason for Treatment: ACT Team Does patient have an ACCT team?: Yes Does patient have Intensive In-House Services?  : No Does patient have [facility] services? : No Does patient have P4CC services?: No  ADL Screening (condition at time of admission) Patient's cognitive ability adequate to safely complete daily activities?: Yes Patient able to express need for assistance with ADLs?: Yes Independently performs ADLs?: Yes (appropriate for developmental age)       Abuse/Neglect Assessment (Assessment to be complete while patient is alone) Abuse/Neglect Assessment Can Be Completed: Yes Physical Abuse: Denies Verbal Abuse: Denies Sexual Abuse: Denies Exploitation of patient/patient's resources: Denies Self-Neglect: Denies Values / Beliefs Cultural Requests During Hospitalization: None Spiritual Requests During Hospitalization: None Consults Spiritual Care Consult Needed: No Social Work Consult Needed: No         Child/Adolescent Assessment Running Away Risk: (Patient is an adult)  Disposition:  Disposition Initial Assessment Completed for this Encounter: Yes Disposition of Patient: (Pending SOC Consult)  On Site Evaluation by:   Reviewed with Physician:    Frederich Cha 07/11/2018 7:31 PM

## 2018-07-12 ENCOUNTER — Inpatient Hospital Stay
Admission: AD | Admit: 2018-07-12 | Discharge: 2018-07-16 | DRG: 885 | Disposition: A | Discharge status: Home / Self Care | Payer: Medicare Other | Attending: Psychiatry | Admitting: Psychiatry

## 2018-07-12 ENCOUNTER — Other Ambulatory Visit: Payer: Self-pay

## 2018-07-12 DIAGNOSIS — F17210 Nicotine dependence, cigarettes, uncomplicated: Secondary | ICD-10-CM | POA: Diagnosis present

## 2018-07-12 DIAGNOSIS — Z79899 Other long term (current) drug therapy: Secondary | ICD-10-CM | POA: Diagnosis not present

## 2018-07-12 DIAGNOSIS — F29 Unspecified psychosis not due to a substance or known physiological condition: Secondary | ICD-10-CM | POA: Insufficient documentation

## 2018-07-12 DIAGNOSIS — F200 Paranoid schizophrenia: Secondary | ICD-10-CM

## 2018-07-12 DIAGNOSIS — F250 Schizoaffective disorder, bipolar type: Secondary | ICD-10-CM | POA: Diagnosis present

## 2018-07-12 DIAGNOSIS — F17200 Nicotine dependence, unspecified, uncomplicated: Secondary | ICD-10-CM | POA: Diagnosis present

## 2018-07-12 DIAGNOSIS — G4700 Insomnia, unspecified: Secondary | ICD-10-CM | POA: Diagnosis present

## 2018-07-12 DIAGNOSIS — F1010 Alcohol abuse, uncomplicated: Secondary | ICD-10-CM | POA: Diagnosis present

## 2018-07-12 DIAGNOSIS — F209 Schizophrenia, unspecified: Secondary | ICD-10-CM | POA: Insufficient documentation

## 2018-07-12 LAB — URINE DRUG SCREEN, QUALITATIVE (ARMC ONLY)
AMPHETAMINES, UR SCREEN: NOT DETECTED
BENZODIAZEPINE, UR SCRN: NOT DETECTED
Barbiturates, Ur Screen: NOT DETECTED
Cannabinoid 50 Ng, Ur ~~LOC~~: NOT DETECTED
Cocaine Metabolite,Ur ~~LOC~~: NOT DETECTED
MDMA (Ecstasy)Ur Screen: NOT DETECTED
METHADONE SCREEN, URINE: NOT DETECTED
OPIATE, UR SCREEN: NOT DETECTED
Phencyclidine (PCP) Ur S: NOT DETECTED
Tricyclic, Ur Screen: NOT DETECTED

## 2018-07-12 MED ORDER — DIVALPROEX SODIUM 500 MG PO DR TAB
1000.0000 mg | DELAYED_RELEASE_TABLET | Freq: Every evening | ORAL | Status: DC
  Administered 2018-07-12 – 2018-07-15 (×4): 1000 mg via ORAL
  Filled 2018-07-12 (×4): qty 2

## 2018-07-12 MED ORDER — TRAZODONE HCL 100 MG PO TABS
100.0000 mg | ORAL_TABLET | Freq: Every evening | ORAL | Status: DC | PRN
  Administered 2018-07-13 – 2018-07-14 (×2): 100 mg via ORAL
  Filled 2018-07-12 (×2): qty 1

## 2018-07-12 MED ORDER — ACETAMINOPHEN 325 MG PO TABS: 650.0000 mg | ORAL_TABLET | Freq: Four times a day (QID) | ORAL | Status: DC | PRN

## 2018-07-12 MED ORDER — HALOPERIDOL DECANOATE 100 MG/ML IM SOLN
100.0000 mg | Freq: Once | INTRAMUSCULAR | Status: AC
  Administered 2018-07-12: 100 mg via INTRAMUSCULAR
  Filled 2018-07-12: qty 1

## 2018-07-12 MED ORDER — ALUM & MAG HYDROXIDE-SIMETH 200-200-20 MG/5ML PO SUSP: 30.0000 mL | ORAL | Status: DC | PRN

## 2018-07-12 MED ORDER — DIPHENHYDRAMINE HCL 50 MG/ML IJ SOLN: 50.0000 mg | Freq: Four times a day (QID) | INTRAMUSCULAR | Status: DC | PRN

## 2018-07-12 MED ORDER — MAGNESIUM HYDROXIDE 400 MG/5ML PO SUSP: 30.0000 mL | Freq: Every day | ORAL | Status: DC | PRN

## 2018-07-12 MED ORDER — ADULT MULTIVITAMIN W/MINERALS CH
1.0000 | ORAL_TABLET | Freq: Every day | ORAL | Status: DC
  Administered 2018-07-13 – 2018-07-16 (×4): 1 via ORAL
  Filled 2018-07-12 (×4): qty 1

## 2018-07-12 MED ORDER — DIVALPROEX SODIUM 500 MG PO DR TAB
500.0000 mg | DELAYED_RELEASE_TABLET | ORAL | Status: DC
  Administered 2018-07-13 – 2018-07-16 (×4): 500 mg via ORAL
  Filled 2018-07-12 (×4): qty 1

## 2018-07-12 MED ORDER — OMEGA-3-ACID ETHYL ESTERS 1 G PO CAPS
1.0000 | ORAL_CAPSULE | Freq: Every day | ORAL | Status: DC
  Administered 2018-07-13 – 2018-07-16 (×4): 1 g via ORAL
  Filled 2018-07-12 (×4): qty 1

## 2018-07-12 MED ORDER — HYDROXYZINE HCL 50 MG PO TABS: 50.0000 mg | ORAL_TABLET | Freq: Three times a day (TID) | ORAL | Status: DC | PRN

## 2018-07-12 MED ORDER — DIPHENHYDRAMINE HCL 25 MG PO CAPS: 50.0000 mg | ORAL_CAPSULE | Freq: Four times a day (QID) | ORAL | Status: DC | PRN

## 2018-07-12 MED ORDER — HALOPERIDOL 5 MG PO TABS
5.0000 mg | ORAL_TABLET | Freq: Two times a day (BID) | ORAL | Status: DC
  Administered 2018-07-12 – 2018-07-14 (×4): 5 mg via ORAL
  Filled 2018-07-12 (×4): qty 1

## 2018-07-12 NOTE — Progress Notes (Signed)
This is a new admit who is known to [place] from previous encounter with this unit , patient present this time for non compliant with his medication regimen from the ACT team. Upon further assessment patient is incoherent with speech and statements and has bizarre thought process, patient is pleasant none aggressive and follows directions. Patient is  Stable and alert x 3, patient contract for safety , boody search and skin check is by Alex/Math RN. No contraband found and skin is clean, unit guide lines and expected behaviors are discussed, room and unit orientation is complete , patient is reminded of 15 minute safety checks. Patient denies any suicide ideations and thoughts , beverages and juice is provided and received , no distress noted.

## 2018-07-12 NOTE — ED Notes (Signed)
Hourly rounding reveals patient sleeping in room. No complaints, stable, in no acute distress. Q15 minute rounds and monitoring via Security Cameras to continue. 

## 2018-07-12 NOTE — ED Notes (Signed)
Pt given breakfast, lunch and dinner tray this shift. Hand hygiene encouraged prior to meals.

## 2018-07-12 NOTE — BH Assessment (Signed)
Patient is to be admitted to [facility] by Dr. Weber Cooks.  Attending Physician will be Dr. Weber Cooks.   Patient has been assigned to room 320, by [place].   ER staff is aware of the admission:  Glenda, ER Secretary    Dr. Jimmye Norman, ER MD   Amy B., Patient's Nurse   Gust Rung., Patient Access.

## 2018-07-12 NOTE — Consult Note (Signed)
[name] Face-to-Face Psychiatry Consult   Reason for Consult: Consult for this 63 year old man with schizophrenia brought to the emergency room Referring Physician: Jimmye Norman Patient Identification: Dustin [name] MRN:  [id number] Principal Diagnosis: Paranoid schizophrenia ([place]) Diagnosis:  Principal Problem:   Paranoid schizophrenia ([place]) Active Problems:   Tardive dyskinesia   Total Time spent with patient: 1 hour  Subjective:   Dustin [name] is a 63 y.o. male patient admitted with "I forgot to get my shot".  HPI: 63 year old man with a history of schizophrenia.  His act team went to his home to give him his long-acting injection.  He refused to get in and was making bizarre statements talking about dead things inside his house making hostile sounding statements and therefore they filed a commitment petition.  Patient admits to me that he did not get his shot says that this is because he got mixed up about the dates.  He tells me that he needs to just get his shot and then go back home.  However he then goes on to ramble about hyper religious and bizarre things talk about having hallucinations talk about having microphones inside of his teeth all the usual kind of delusions.  Tells me that he is not eating well.  He also tells me that his brother who usually stays with him is currently in the hospital at [facility] and so the patient has been staying alone at home.  Social history: Lives with his brother.  Usually brother keeps an eye on him but it sounds like the patient has been left by himself recently.  Medical history: Tardive dyskinesia otherwise in pretty good health other than his schizophrenia  Substance abuse history: No problems documented of alcohol or drug abuse  Past Psychiatric History: Long-standing schizophrenia several prior hospitalizations.  No known suicide attempts.  Has been somewhat hostile and agitated when psychotic.  Does reasonably well when he gets on long-acting injectable  shots.  Last seen here last year with Dr. Wonda Olds.  He does have an act team who follows him up.  Risk to Self: Suicidal Ideation: No Suicidal Intent: No Is patient at risk for suicide?: No Suicidal Plan?: No Access to Means: No What has been your use of drugs/alcohol within the last 12 months?: None Reported How many times?: 0 Other Self Harm Risks: None Reported Triggers for Past Attempts: None known Intentional Self Injurious Behavior: None Risk to Others: Homicidal Ideation: No Thoughts of Harm to Others: No Current Homicidal Intent: No Current Homicidal Plan: No Access to Homicidal Means: No Identified Victim: None Reported History of harm to others?: No Assessment of Violence: None Noted Violent Behavior Description: None Reported Does patient have access to weapons?: No Criminal Charges Pending?: No Does patient have a court date: No Prior Inpatient Therapy: Prior Inpatient Therapy: Yes Prior Therapy Dates: Pt unable to recall dates Prior Therapy Facilty/Provider(s): [facility] Reason for Treatment: Depression Prior Outpatient Therapy: Prior Outpatient Therapy: Yes Prior Therapy Dates: Current Prior Therapy Facilty/Provider(s): [facility] Reason for Treatment: ACT Team Does patient have an ACCT team?: Yes Does patient have Intensive In-House Services?  : No Does patient have [facility] services? : No Does patient have P4CC services?: No  Past Medical History:  Past Medical History:  Diagnosis Date  . Schizophrenia ([place])    No past surgical history on file. Family History: No family history on file. Family Psychiatric  History: None known Social History:  Social History   Substance and Sexual Activity  Alcohol Use  Yes  . Alcohol/week: 7.0 standard drinks  . Types: 7 Shots of liquor per week   Comment: 1 drink every night- gin     Social History   Substance and Sexual Activity  Drug Use No    Social History   Socioeconomic History  . Marital status: Single     Spouse name: Not on file  . Number of children: Not on file  . Years of education: Not on file  . Highest education level: Not on file  Occupational History  . Not on file  Social Needs  . Financial resource strain: Not on file  . Food insecurity:    Worry: Not on file    Inability: Not on file  . Transportation needs:    Medical: Not on file    Non-medical: Not on file  Tobacco Use  . Smoking status: Current Every Day Smoker    Packs/day: 0.50    Years: 15.00    Pack years: 7.50  . Smokeless tobacco: Never Used  Substance and Sexual Activity  . Alcohol use: Yes    Alcohol/week: 7.0 standard drinks    Types: 7 Shots of liquor per week    Comment: 1 drink every night- gin  . Drug use: No  . Sexual activity: Never    Birth control/protection: None  Lifestyle  . Physical activity:    Days per week: Not on file    Minutes per session: Not on file  . Stress: Not on file  Relationships  . Social connections:    Talks on phone: Not on file    Gets together: Not on file    Attends religious service: Not on file    Active member of club or organization: Not on file    Attends meetings of clubs or organizations: Not on file    Relationship status: Not on file  Other Topics Concern  . Not on file  Social History Narrative  . Not on file   Additional Social History:    Allergies:  No Known Allergies  Labs:  Results for orders placed or performed during the hospital encounter of 07/11/18 (from the past 48 hour(s))  Urine Drug Screen, Qualitative     Status: None   Collection Time: 07/11/18 11:51 AM  Result Value Ref Range   Tricyclic, Ur Screen NONE DETECTED NONE DETECTED   Amphetamines, Ur Screen NONE DETECTED NONE DETECTED   MDMA (Ecstasy)Ur Screen NONE DETECTED NONE DETECTED   Cocaine Metabolite,Ur [place] NONE DETECTED NONE DETECTED   Opiate, Ur Screen NONE DETECTED NONE DETECTED   Phencyclidine (PCP) Ur S NONE DETECTED NONE DETECTED   Cannabinoid 50 Ng, Ur [place] NONE  DETECTED NONE DETECTED   Barbiturates, Ur Screen NONE DETECTED NONE DETECTED   Benzodiazepine, Ur Scrn NONE DETECTED NONE DETECTED   Methadone Scn, Ur NONE DETECTED NONE DETECTED    Comment: (NOTE) Tricyclics + metabolites, urine    Cutoff 1000 ng/mL Amphetamines + metabolites, urine  Cutoff 1000 ng/mL MDMA (Ecstasy), urine              Cutoff 500 ng/mL Cocaine Metabolite, urine          Cutoff 300 ng/mL Opiate + metabolites, urine        Cutoff 300 ng/mL Phencyclidine (PCP), urine         Cutoff 25 ng/mL Cannabinoid, urine                 Cutoff 50 ng/mL Barbiturates + metabolites, urine  Cutoff 200 ng/mL Benzodiazepine, urine              Cutoff 200 ng/mL Methadone, urine                   Cutoff 300 ng/mL The urine drug screen provides only a preliminary, unconfirmed analytical test result and should not be used for non-medical purposes. Clinical consideration and professional judgment should be applied to any positive drug screen result due to possible interfering substances. A more specific alternate chemical method must be used in order to obtain a confirmed analytical result. Gas chromatography / mass spectrometry (GC/MS) is the preferred confirmat ory method. Performed at [facility], [address] [place], [place] [address]   Comprehensive metabolic panel     Status: Abnormal   Collection Time: 07/11/18  2:30 PM  Result Value Ref Range   Sodium 139 135 - 145 mmol/L   Potassium 3.4 (L) 3.5 - 5.1 mmol/L   Chloride 110 98 - 111 mmol/L   CO2 22 22 - 32 mmol/L   Glucose, Bld 134 (H) 70 - 99 mg/dL   BUN 9 8 - 23 mg/dL   Creatinine, Ser 1.26 (H) 0.61 - 1.24 mg/dL   Calcium 9.1 8.9 - 10.3 mg/dL   Total Protein 7.2 6.5 - 8.1 g/dL   Albumin 3.9 3.5 - 5.0 g/dL   AST 14 (L) 15 - 41 U/L   ALT 8 0 - 44 U/L   Alkaline Phosphatase 65 38 - 126 U/L   Total Bilirubin 0.4 0.3 - 1.2 mg/dL   GFR calc non Af Amer >60 >60 mL/min   GFR calc Af Amer >60 >60 mL/min   Anion  gap 7 5 - 15    Comment: Performed at [facility], [address] [place], [place] [address]  Ethanol     Status: None   Collection Time: 07/11/18  2:30 PM  Result Value Ref Range   Alcohol, Ethyl (B) <10 <10 mg/dL    Comment: (NOTE) Lowest detectable limit for serum alcohol is 10 mg/dL. For medical purposes only. Performed at [facility], [address] [place], [place] [address]   Salicylate level     Status: None   Collection Time: 07/11/18  2:30 PM  Result Value Ref Range   Salicylate Lvl <8.1 2.8 - 30.0 mg/dL    Comment: Performed at [facility], [address] [place], [place] [address]  Acetaminophen level     Status: Abnormal   Collection Time: 07/11/18  2:30 PM  Result Value Ref Range   Acetaminophen (Tylenol), Serum <10 (L) 10 - 30 ug/mL    Comment: (NOTE) Therapeutic concentrations vary significantly. A range of 10-30 ug/mL  may be an effective concentration for many patients. However, some  are best treated at concentrations outside of this range. Acetaminophen concentrations >150 ug/mL at 4 hours after ingestion  and >50 ug/mL at 12 hours after ingestion are often associated with  toxic reactions. Performed at [facility], [address] [place],  [address]   cbc     Status: None   Collection Time: 07/11/18  2:30 PM  Result Value Ref Range   WBC 7.7 4.0 - 10.5 K/uL   RBC 5.36 4.22 - 5.81 MIL/uL   Hemoglobin 14.5 13.0 - 17.0 g/dL   HCT 44.7 39.0 - 52.0 %   MCV 83.4 80.0 - 100.0 fL   MCH 27.1 26.0 - 34.0 pg   MCHC 32.4 30.0 -  36.0 g/dL   RDW 13.6 11.5 - 15.5 %   Platelets 188 150 - 400 K/uL   nRBC 0.0 0.0 - 0.2 %    Comment: Performed at [facility], [address] [place], [place] [address]    Current Facility-Administered Medications  Medication Dose Route Frequency Provider Last Rate Last Dose  . diphenhydrAMINE (BENADRYL) capsule 50 mg  50 mg Oral Q6H PRN Lavella Hammock, MD   50 mg at  07/11/18 2331   Or  . diphenhydrAMINE (BENADRYL) injection 50 mg  50 mg Intramuscular Q6H PRN Lavella Hammock, MD      . divalproex (DEPAKOTE) DR tablet 1,000 mg  1,000 mg Oral QPM Lavella Hammock, MD   1,000 mg at 07/11/18 2149  . divalproex (DEPAKOTE) DR tablet 500 mg  500 mg Oral Doroteo Bradford, MD   500 mg at 07/12/18 4259  . haloperidol (HALDOL) tablet 5 mg  5 mg Oral BID Lavella Hammock, MD   5 mg at 07/12/18 0954  . multivitamin with minerals tablet 1 tablet  1 tablet Oral Daily Lavella Hammock, MD   1 tablet at 07/12/18 [phone]  . omega-3 acid ethyl esters (LOVAZA) capsule 1 g  1 capsule Oral Daily Lavella Hammock, MD   1 g at 07/12/18 7564   Current Outpatient Medications  Medication Sig Dispense Refill  . ARIPiprazole ER (ABILIFY MAINTENA) 400 MG PRSY prefilled syringe Inject 400 mg into the muscle.    . haloperidol decanoate (HALDOL DECANOATE) 100 MG/ML injection Inject 100 mg into the muscle every 30 (thirty) days.    . divalproex (DEPAKOTE) 500 MG DR tablet Take 1 tablet (500 mg total) by mouth See admin instructions. Take 1 tablet in the morning and 2 tablets at bedtime 500 mg qam and 1000 mg qpm 90 tablet 0  . haloperidol (HALDOL) 5 MG tablet Take 1 tablet (5 mg total) by mouth 2 (two) times daily. 60 tablet 0  . haloperidol decanoate (HALDOL DECANOATE) 100 MG/ML injection Inject 1 mL (100 mg total) into the muscle once for 1 dose. Next dose due 5/15 1 mL   . Multiple Vitamin (MULTIVITAMIN) tablet Take 1 tablet by mouth daily.    . Omega-3 Fatty Acids (OMEGA-3 FISH OIL PO) Take 1 capsule by mouth daily.    . paliperidone (INVEGA SUSTENNA) 234 MG/1.5ML SUSP injection Inject 234 mg into the muscle once for 1 dose. Next dose due 4/23 1.5 mL 0    Musculoskeletal: Strength & Muscle Tone: within normal limits Gait & Station: normal Patient leans: N/A  Psychiatric Specialty Exam: Physical Exam  Nursing note and vitals reviewed. Constitutional: He appears  well-developed and well-nourished.  HENT:  Head: Normocephalic and atraumatic.  Eyes: Pupils are equal, round, and reactive to light. Conjunctivae are normal.  Neck: Normal range of motion.  Cardiovascular: Regular rhythm and normal heart sounds.  Respiratory: Effort normal. No respiratory distress.  GI: Soft.  Musculoskeletal: Normal range of motion.  Neurological: He is alert.  Skin: Skin is warm and dry.  Psychiatric: His affect is blunt and inappropriate. His speech is tangential. He is agitated and actively hallucinating. He is not aggressive. Thought content is paranoid and delusional. Cognition and memory are impaired. He expresses impulsivity. He is noncommunicative.    Review of Systems  Constitutional: Negative.   HENT: Negative.   Eyes: Negative.   Respiratory: Negative.   Cardiovascular: Negative.   Gastrointestinal: Negative.   Musculoskeletal: Negative.   Skin:  Negative.   Neurological: Negative.   Psychiatric/Behavioral: Positive for hallucinations and memory loss. Negative for depression, substance abuse and suicidal ideas. The patient is nervous/anxious and has insomnia.     Blood pressure (!) 136/92, pulse 73, temperature 98.2 F (36.8 C), temperature source Oral, resp. rate 20, height 6\' 2"  (1.88 m), weight 66.7 kg, SpO2 100 %.Body mass index is 18.87 kg/m.  General Appearance: Casual  Eye Contact:  Good  Speech:  Clear and Coherent  Volume:  Normal  Mood:  Euthymic  Affect:  Congruent  Thought Process:  Disorganized  Orientation:  Full (Time, Place, and Person)  Thought Content:  Illogical, Delusions, Paranoid Ideation and Rumination  Suicidal Thoughts:  No  Homicidal Thoughts:  No  Memory:  Immediate;   Fair Recent;   Poor Remote;   Fair  Judgement:  Impaired  Insight:  Shallow  Psychomotor Activity:  Decreased  Concentration:  Concentration: Poor  Recall:  Poor  Fund of Knowledge:  Poor  Language:  Poor  Akathisia:  No  Handed:  Right  AIMS (if  indicated):     Assets:  Desire for Improvement Housing Physical Health  ADL's:  Impaired  Cognition:  Impaired,  Mild  Sleep:        Treatment Plan Summary: Daily contact with patient to assess and evaluate symptoms and progress in treatment, Medication management and Plan I put in an order to get him a Haldol decanoate shot but because he is so actively psychotic and it sounds like he is living at home by himself right now I think it is appropriate to admit him to the hospital for stabilization.  Continue involuntary commitment.  Orders done for admission to the hospital.  Continue Haldol and Depakote as previously ordered.  EKG and full set of labs will be ordered.  Disposition: Recommend psychiatric Inpatient admission when medically cleared. Supportive therapy provided about ongoing stressors.  Alethia Berthold, MD 07/12/2018 3:48 PM

## 2018-07-12 NOTE — ED Notes (Signed)
Animal nutritionist went with RN to administer Haldol decanoate injection. Pt  became agitated, telling staff he was not [employer]. Pt making other comments that could not be understood by this Probation officer. Pt stood up from bed and pulled down his pants. "I take it in my hip."    Pt is hyper-religious.   Maintained on 15 minute checks and observation by security camera for safety.

## 2018-07-12 NOTE — BH Assessment (Signed)
Patient is to be admitted to [facility] by Dr. Weber Cooks.  Attending Physician will be Dr. Weber Cooks.   Patient has been assigned to room 320, by Bullock.   ER staff is aware of the admission:  Glenda, ER Secretary    Dr. Jimmye Norman, ER MD   Amy B., Patient's Nurse   Gust Rung., Patient Access.

## 2018-07-12 NOTE — Tx Team (Signed)
Initial Treatment Plan 07/12/2018 10:53 PM Dustin [name] FTD:[id number]    PATIENT STRESSORS: Financial difficulties Medication change or noncompliance Occupational concerns   PATIENT STRENGTHS: Capable of independent living Motivation for treatment/growth Supportive family/friends   PATIENT IDENTIFIED PROBLEMS: Disorganized thought process    None compliant with medications    Depression/Anxiety             DISCHARGE CRITERIA:  Adequate post-discharge living arrangements Medical problems require only outpatient monitoring Need for constant or close observation no longer present Reduction of life-threatening or endangering symptoms to within safe limits  PRELIMINARY DISCHARGE PLAN: Attend 12-step recovery group Participate in family therapy Return to previous living arrangement  PATIENT/FAMILY INVOLVEMENT: This treatment plan has been presented to and reviewed with the patient, Dustin [name], .  The patient  have been given the opportunity to ask questions and make suggestions.  Clemens Catholic, RN 07/12/2018, 10:53 PM

## 2018-07-12 NOTE — ED Notes (Signed)
IVC/ Consult completed/ Plan to Admit to BMU

## 2018-07-13 DIAGNOSIS — F17200 Nicotine dependence, unspecified, uncomplicated: Secondary | ICD-10-CM

## 2018-07-13 DIAGNOSIS — F250 Schizoaffective disorder, bipolar type: Principal | ICD-10-CM

## 2018-07-13 LAB — HEMOGLOBIN A1C
HEMOGLOBIN A1C: 5.6 % (ref 4.8–5.6)
Mean Plasma Glucose: 114.02 mg/dL

## 2018-07-13 LAB — LIPID PANEL
Cholesterol: 137 mg/dL (ref 0–200)
HDL: 47 mg/dL (ref >40)
LDL Cholesterol: 82 mg/dL (ref 0–99)
Total CHOL/HDL Ratio: 2.9 RATIO
Triglycerides: 40 mg/dL (ref <150)
VLDL: 8 mg/dL (ref 0–40)

## 2018-07-13 LAB — TSH: TSH: 1.378 u[IU]/mL (ref 0.350–4.500)

## 2018-07-13 NOTE — Plan of Care (Signed)
Has been in the dayroom with staff and peers. Pleasant and cooperative.

## 2018-07-13 NOTE — BHH Group Notes (Signed)
LCSW Group Therapy Note  07/13/2018 1:15pm  Type of Therapy and Topic: Group Therapy: Holding on to Grudges   Participation Level: Did Not Attend   Description of Group:  In this group patients will be asked to explore and define a grudge. Patients will be guided to discuss their thoughts, feelings, and reasons as to why people have grudges. Patients will process the impact grudges have on daily life and identify thoughts and feelings related to holding grudges. Facilitator will challenge patients to identify ways to let go of grudges and the benefits this provides. Patients will be confronted to address why one struggles letting go of grudges. Lastly, patients will identify feelings and thoughts related to what life would look like without grudges. This group will be process-oriented, with patients participating in exploration of their own experiences, giving and receiving support, and processing challenge from other group members.  Therapeutic Goals:  1. Patient will identify specific grudges related to their personal life.  2. Patient will identify feelings, thoughts, and beliefs around grudges.  3. Patient will identify how one releases grudges appropriately.  4. Patient will identify situations where they could have let go of the grudge, but instead chose to hold on.   Summary of Patient Progress:Pt was invited to attend group but chose not to attend. CSW will continue to encourage pt to attend group throughout their admission.    Therapeutic Modalities:  Cognitive Behavioral Therapy  Solution Focused Therapy  Motivational Interviewing  Brief Therapy   Tonnia Bardin  CUEBAS-COLON, LCSW 07/13/2018 9:55 AM

## 2018-07-13 NOTE — BHH Counselor (Addendum)
CSW called [facility] crisis line at [phone] and spoke with Sharyn Lull. Sharyn Lull provided information about the psych meds. prescribed by their psychiatrist Dr. Loni Muse; Haloperidol 1cc 1x monthly and Kirt Boys 234mg  1x monthly. Last time the pt received the shot was on January 8th. Sharyn Lull was unable to provide additional information since the other medications are prescribed by the patient's PCP.   CSW faxed to [facility] requesting list of meds for patient.    Cheree Ditto, LCSW 07/13/18 03:34PM

## 2018-07-13 NOTE — Clinical Social Work Note (Signed)
Inpatient medical CSW received the following consult: "Please get outpatient records from [facility] Team with dates of last long acting injection and med list". CSW has updated the BMU Education officer, museum. CSW is signing off.  Santiago Bumpers, MSW, Latanya Presser [phone]

## 2018-07-13 NOTE — BHH Suicide Risk Assessment (Signed)
[facility] INPATIENT:  Family/Significant Other Suicide Prevention Education  Suicide Prevention Education:  Patient Refusal for Family/Significant Other Suicide Prevention Education: The patient JERZY ROEPKE has refused to provide written consent for family/significant other to be provided Family/Significant Other Suicide Prevention Education during admission and/or prior to discharge.  Physician notified.  Sarinah Doetsch  CUEBAS-COLON 07/13/2018, 11:37 AM

## 2018-07-13 NOTE — Plan of Care (Signed)
  Problem: Education: Goal: Utilization of techniques to improve thought processes will improve Note:  Patient remained  altered thought . Continue to voice of unrelated  topics  that are bizarre    Problem: Activity: Goal: Interest or engagement in leisure activities will improve Note:  Limited involvement   with peers  on unit or unit programing  Goal: Imbalance in normal sleep/wake cycle will improve Note:  Voice no concerns around  wake or sleep cycle    Problem: Coping: Goal: Coping ability will improve Note:  Patient able to  cope  with  being on the unit  Goal: Will verbalize feelings Note:  Able to let staff know of issues  going on . Continue to voice of wanting to go home    Problem: Education: Goal: Ability to state activities that reduce stress will improve Note:  Voice of no anxiety  concerns    Problem: Self-Concept: Goal: Ability to identify factors that promote anxiety will improve Note:  Patient  unable to identify  anxiety and  unable to explore  cognitively  reasons  that led up  to anxiety

## 2018-07-13 NOTE — [facility] Counselor (Signed)
Adult Comprehensive Assessment  Patient ID: Dustin [name], male   DOB: [birth year], 63 y.o.   MRN: [id number]  Information Source: Information source: Patient  Current Stressors:  Educational / Learning stressors: No stressors identified  Employment / Job issues: No stressors identified  Family Relationships: No stressors identified  Museum/gallery curator / Lack of resources (include bankruptcy): No stressors identified  Housing / Lack of housing: No stressors identified  Physical health (include injuries &life threatening diseases): No stressors identified  Social relationships: No stressors identified  Substance abuse: Alcohol use, patient doesn't identify this as a problem  Bereavement / Loss: No stressors identified   Living/Environment/Situation:  Living Arrangements: Other relatives- Brother and sister-in-law Living conditions (as described by patient or guardian): They are fine  How long has patient lived in current situation?: Since 1994 What is atmosphere in current home: Comfortable, Supportive  Family History:  Marital status: Single Does patient have children?: No  Childhood History:  By whom was/is the patient raised?: Mother Description of patient's relationship with caregiver when they were a child: It was alright  Patient's description of current relationship with people who raised him/her: Mother deceased  How were you disciplined when you got in trouble as a child/adolescent?: Whoopings Does patient have siblings?: Yes Number of Siblings: 5 Description of patient's current relationship with siblings: 2 sisters, 3 brothers - do not see them often Did patient suffer any verbal/emotional/physical/sexual abuse as a child?: No Did patient suffer from severe childhood neglect?: No Has patient ever been sexually abused/assaulted/raped as an adolescent or adult?: No Was the patient ever a victim of a crime or a disaster?: No Witnessed domestic violence?: No Has patient been  effected by domestic violence as an adult?: No  Education:  Highest grade of school patient has completed: 9th grade  Currently a student?: No Name of school: n/a Learning disability?: No  Employment/Work Situation:  Employment situation: On disability Why is patient on disability: Mental health  How long has patient been on disability: Since 1993 What is the longest time patient has a held a job?: "It varies" Where was the patient employed at that time?: Unknown  Has patient ever been in the [employer]?: No Has patient ever served in combat?: No Did You Receive Any Psychiatric Treatment/Services While in the [employer]?: No Are There Guns or Other Weapons in [place]?: No Are These Psychologist, educational?: (N/A)  Financial Resources:  Financial resources: Marine scientist SSDI Does patient have a Programmer, applications or guardian?: No  Alcohol/Substance Abuse:  What has been your use of drugs/alcohol within the last 12 months?: Pt reports alcohol use.  If attempted suicide, did drugs/alcohol play a role in this?: No Alcohol/Substance Abuse Treatment Hx: Denies past history Has alcohol/substance abuse ever caused legal problems?: No  Social Support System: Patient's Community Support System: Good Describe Community Support System: Patient stated that his brother helps him alot - and he maintain on his own. 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 Type of faith/religion: Christianity  How does patient's faith help to cope with current illness?: Read the bible, talk to pastors  Leisure/Recreation:  Leisure and Hobbies: None identified   Strengths/Needs:  What things does the patient do well?: unknown In what areas does patient struggle / problems for patient: Possibly alcohol, medication compliance.  Discharge Plan:  Does patient have access to transportation?: Yes- Says that his brother will come and get him Will patient be returning to same living situation  after discharge?: Yes  with bother Currently receiving community mental health services: Yes- [facility].  Does patient have financial barriers related to discharge medications?: No  Summary/Recommendations:  Patient is a 63 year old male admitted involuntarily and diagnosed with Paranoid schizophrenia ([place]).  His act team went to his home to give him his long-acting injection.  He refused to get in and was making bizarre statements talking about dead things inside his house making hostile sounding statements and therefore they filed a commitment petition. Patient will benefit from crisis stabilization, medication evaluation, group therapy and psychoeducation. In addition to case management for discharge planning. At discharge it is recommended that patient adhere to the established discharge plan and continue treatment.     Dustin [name]  CUEBAS-COLON. 07/13/2018

## 2018-07-13 NOTE — Progress Notes (Signed)
D: Patient stated slept good last night .Stated appetite  good and energy level normal. Stated concentration fair  Stated no Depression, hopeless and anxiety  Denies suicidal  homicidal ideations  . Denies  auditory hallucinations  But is responding  To stimuli  No pain concerns . Appropriate ADL'S. Interacting with peers and staff.  A: Encourage patient participation with unit programming . Instruction  Given on  Medication , verbalize understanding.Patient remained  altered thought . Continue to voice of unrelated  topics  that are bizarre Limited involvement   with peers  on unit or unit programing Patient able to  cope  with  being on the unit Able to let staff know of issues  going on . Continue to voice of wanting to go home Voice of no anxiety  concerns Patient  unable to identify  anxiety and  unable to Explore  cognitively  reasons  that led up  to anxiety    R: Voice no other concerns. Staff continue to monitor

## 2018-07-13 NOTE — [facility] Suicide Risk Assessment (Signed)
[facility] Admission Suicide Risk Assessment   Nursing information obtained from:  Patient Demographic factors:  Male Current Mental Status:  See below Loss Factors:  None Historical Factors:  NA Risk Reduction Factors:  Positive therapeutic relationship  Total Time spent with patient: 45 minutes Principal Problem: Schizoaffective Disorder  Diagnosis:  Active Problems:   Schizoaffective disorder, bipolar type ([place])   Tobacco use disorder  Subjective Data:  Mr Stucky is a 63 year-old single African-American male with a history of schizoaffective disorder bipolar type who is currently followed by [employer] act team.  He was brought into the emergency room by the sheriff's office after he refused to take his long-acting injection.  He was hyperreligious, making bizarre statements and talking about "get things" inside of his house.  He was agitated and hostile with police.  The patient was having some delusional and paranoid thoughts about microphones in his teeth that were a substitute for hearing aids.  He talked about the microphones standing up to his brain and being supported by cerebellum.  He denied any current active or passive suicidal thoughts and denied any severe depressive symptoms.  He denies any feelings of helplessness, anhedonia or frequent crying spells.  He denied any current auditory or visual hallucinations but did appear to be mumbling under his breath at times, possibly responding to internal stimuli.  Per his ACT Team psychiatrist he is hyperreligious at baseline. The patient was supposed to receive both the Haldol Decanoate injection and then [place] injection on the same day.  He did get Haldol decanoate 100 mg IM at 3:30 PM on February 7.  No Invega sustained at injection given.  Records from [facility] are pending.  The patient is currently single, never married and has no children.  He lives with his brother in [place].  Continued Clinical Symptoms:   Alcohol Use Disorder Identification Test Final Score (AUDIT): 7 The "Alcohol Use Disorders Identification Test", Guidelines for Use in Primary Care, Second Edition.  World Pharmacologist [facility]). Score between 0-7:  no or low risk or alcohol related problems. Score between 8-15:  moderate risk of alcohol related problems. Score between 16-19:  high risk of alcohol related problems. Score 20 or above:  warrants further diagnostic evaluation for alcohol dependence and treatment.   CLINICAL FACTORS:   Schizophrenia:   Paranoid or undifferentiated type Currently Psychotic Unstable or Poor Therapeutic Relationship Previous Psychiatric Diagnoses and Treatments   Musculoskeletal: Strength & Muscle Tone: within normal limits Gait & Station: normal Patient leans: N/A  Psychiatric Specialty Exam: Physical Exam: See H+P  ROS: See H+P  Blood pressure 103/90, pulse (!) 121, temperature 97.6 F (36.4 C), temperature source Oral, resp. rate 18, height 5\' 9"  (1.753 m), weight 69.4 kg, SpO2 100 %.Body mass index is 22.59 kg/m.  MSE: SEe H+P                                                        COGNITIVE FEATURES THAT CONTRIBUTE TO RISK:  Psychotic  SUICIDE RISK:   Minimal: No identifiable suicidal ideation.  Patients presenting with no risk factors but with morbid ruminations; may be classified as minimal risk based on the severity of the depressive symptoms. He denies any access to guns  PLAN OF CARE:  DIAGNOSIS Schizoaffective Disorder, Bipolar  Type   Mr. Hicks is a 63 year old single African-American male with history of schizoaffective disorder, bipolar type currently followed by [name] act team.  He was admitted to the hospital he was brought to the hospital after refusing a long-acting injection and acting in a hostile and psychotic manner.  He will be admitted to inpatient psychiatry for medication management, safety and  stabilization.  Schizoaffective disorder, bipolar type: -We will plan to restart Depakote 500 mg p.o. daily and 1000mg  po nightly for mood stabilization.  -Valproic acid level was not checked at admission and will need to check valproic acid level.  LFTs within normal limits -The patient received Haldol Decanoate injection of 100 mg IM on 07/12/18.  He also has Haldol 5 mg p.o. twice daily oral for the next few days and then will discontinue Haldol. -It is unclear whether or not he should receive in [place] sustain injections.  We will get records from [facility]. -We will continue trazodone 100 mg p.o. nightly as needed for insomnia -Total cholesterol was 137 and hemoglobin A1c is pending -We will check EKG to rule out QTc prolongation  Tobacco use disorder:  -The patient refuses a nicotine patch  Disposition: -The patient has a stable living situation -Psychotropic medication management follow-up appointment will be scheduled with [name]    I certify that inpatient services furnished can reasonably be expected to improve the patient's condition.   Chauncey Mann, MD 07/13/2018, 2:22 PM

## 2018-07-13 NOTE — H&P (Signed)
Psychiatric Admission Assessment Adult  Patient Identification: Dustin [name] MRN:  [id number] Date of Evaluation:  07/13/2018 Chief Complaint:  schizophrenia Principal Diagnosis: Schizoaffective Disorder, Bipolar Type  Diagnosis:  Active Problems:   Schizoaffective disorder, bipolar type ([place])   Tobacco use disorder  History of Present Illness:   Dustin [name] is a 63 year-old single African-American male with a history of schizoaffective disorder bipolar type who is currently followed by [employer] act team.  He was brought into the emergency room by the sheriff's office after he refused to take his long-acting injection.  He was hyperreligious, making bizarre statements and talking about "get things" inside of his house.  He was agitated and hostile with police.  The patient was having some delusional and paranoid thoughts about microphones in his teeth that were a substitute for hearing aids.  He talked about the microphones standing up to his brain and being supported by cerebellum.  He denied any current active or passive suicidal thoughts and denied any severe depressive symptoms.  He denies any feelings of helplessness, anhedonia or frequent crying spells.  He denied any current auditory or visual hallucinations but did appear to be mumbling under his breath at times, possibly responding to internal stimuli.  Per his ACT Team psychiatrist he is hyperreligious at baseline. The patient was supposed to receive both the Haldol Decanoate injection and then [place] injection on the same day.  He did get Haldol decanoate 100 mg IM at 3:30 PM on February 7.  No Invega sustained at injection given.  Records from [facility] including [facility] are pending.  The patient is currently single, never married and has no children.  He lives with his brother in [place]. Past psychiatric history: The patient has chronic mental illness and a diagnosis of schizoaffective disorder bipolar type.  He has had  multiple prior inpatient psychiatric hospitalizations and is followed by [facility] act team, Dr. Doyne Keel.  No prior suicide attempts.  He is maintained on long-acting injections.   Past medical history Tardive dyskinesia Denies prior surgeries  Family psychiatric history: He denies any history of any mental illness or substance use in the family  Social history: The patient is currently single, never married and no children.  He was raised in [place] and then later in [place] by both his biological parents.  He denies any history of any physical or sexual abuse.  He has 1/9 grade education and never obtained his GED.  He is currently unemployed and on disability  Substance abuse history: He drinks 7 shots of liquor per week per prior record but the patient himself denies any history of any heavy alcohol use or illicit drug use.  Urine tox screen was negative for all substances  Legal history: He does admit to some prior rest but cannot verbalize what the charges were.  He denies any access to guns  Associated Signs/Symptoms: Depression Symptoms: None (Hypo) Manic Symptoms: Some agitation at admission Anxiety Symptoms:  None Psychotic Symptoms:  Delusions, Paranoia, PTSD Symptoms:None Total Time spent with patient: 45 minutes    Is the patient at risk to self? Yes.    Has the patient been a risk to self in the past 6 months? Yes.    Has the patient been a risk to self within the distant past? Yes.    Is the patient a risk to others? Yes.    Has the patient been a risk to others in the past 6 months? Yes.  Has the patient been a risk to others within the distant past? Yes.     Prior Inpatient Therapy:  Yes  Prior Outpatient Therapy:  Yes  Alcohol Screening: 1. How often do you have a drink containing alcohol?: Monthly or less 2. How many drinks containing alcohol do you have on a typical day when you are drinking?: 3 or 4 3. How often do you have six or more drinks  on one occasion?: Less than monthly AUDIT-C Score: 3 4. How often during the last year have you found that you were not able to stop drinking once you had started?: Less than monthly 5. How often during the last year have you failed to do what was normally expected from you becasue of drinking?: Less than monthly 6. How often during the last year have you needed a first drink in the morning to get yourself going after a heavy drinking session?: Less than monthly 7. How often during the last year have you had a feeling of guilt of remorse after drinking?: Less than monthly 8. How often during the last year have you been unable to remember what happened the night before because you had been drinking?: Never 9. Have you or someone else been injured as a result of your drinking?: No 10. Has a relative or friend or a doctor or another health worker been concerned about your drinking or suggested you cut down?: No Alcohol Use Disorder Identification Test Final Score (AUDIT): 7 Alcohol Brief Interventions/Follow-up: Alcohol Education Substance Abuse History in the last 12 months:  No. Consequences of Substance Abuse: NA Previous Psychotropic Medications: Yes  Psychological Evaluations: Yes  Past Medical History:  Past Medical History:  Diagnosis Date  . Schizophrenia ([place])    History reviewed. No pertinent surgical history. * Tobacco Screening: Have you used any form of tobacco in the last 30 days? (Cigarettes, Smokeless Tobacco, Cigars, and/or Pipes): Yes Tobacco use, Select all that apply: 5 or more cigarettes per day Are you interested in Tobacco Cessation Medications?: Yes, will notify MD for an order Counseled patient on smoking cessation including recognizing danger situations, developing coping skills and basic information about quitting provided: Yes Social History:  Social History   Substance and Sexual Activity  Alcohol Use Yes  . Alcohol/week: 7.0 standard drinks  . Types: 7 Shots  of liquor per week   Comment: 1 drink every night- gin     Social History   Substance and Sexual Activity  Drug Use No       Allergies:  No Known Allergies Lab Results:  Results for orders placed or performed during the hospital encounter of 07/12/18 (from the past 48 hour(s))  Lipid panel     Status: None   Collection Time: 07/13/18  6:19 AM  Result Value Ref Range   Cholesterol 137 0 - 200 mg/dL   Triglycerides 40 <150 mg/dL   HDL 47 >40 mg/dL   Total CHOL/HDL Ratio 2.9 RATIO   VLDL 8 0 - 40 mg/dL   LDL Cholesterol 82 0 - 99 mg/dL    Comment:        Total Cholesterol/HDL:CHD Risk Coronary Heart Disease Risk Table                     Men   Women  1/2 Average Risk   3.4   3.3  Average Risk       5.0   4.4  2 X Average Risk   9.6  7.1  3 X Average Risk  23.4   11.0        Use the calculated Patient Ratio above and the CHD Risk Table to determine the patient's CHD Risk.        ATP III CLASSIFICATION (LDL):  <100     mg/dL   Optimal  100-129  mg/dL   Near or Above                    Optimal  130-159  mg/dL   Borderline  160-189  mg/dL   High  >190     mg/dL   Very High Performed at [facility], [address] [place], [place] [address]   TSH     Status: None   Collection Time: 07/13/18  6:19 AM  Result Value Ref Range   TSH 1.378 0.350 - 4.500 uIU/mL    Comment: Performed by a 3rd Generation assay with a functional sensitivity of <=0.01 uIU/mL. Performed at [facility], [address] [place], [place] [address]     Blood Alcohol level:  Lab Results  Component Value Date   [facility] <10 07/11/2018   ETH <10 02/77/4128    Metabolic Disorder Labs:  Lab Results  Component Value Date   HGBA1C 5.4 08/17/2017   MPG 108.28 08/17/2017   MPG 105.41 01/12/2017   No results found for: PROLACTIN Lab Results  Component Value Date   CHOL 137 07/13/2018   TRIG 40 07/13/2018   HDL 47 07/13/2018   CHOLHDL 2.9 07/13/2018   VLDL 8 07/13/2018    LDLCALC 82 07/13/2018   LDLCALC 63 08/17/2017    Current Medications: Current Facility-Administered Medications  Medication Dose Route Frequency Provider Last Rate Last Dose  . acetaminophen (TYLENOL) tablet 650 mg  650 mg Oral Q6H PRN [name], [name], MD      . alum & mag hydroxide-simeth (MAALOX/MYLANTA) 200-200-20 MG/5ML suspension 30 mL  30 mL Oral Q4H PRN [name], [name], MD      . diphenhydrAMINE (BENADRYL) capsule 50 mg  50 mg Oral Q6H PRN [name], [name], MD       Or  . diphenhydrAMINE (BENADRYL) injection 50 mg  50 mg Intramuscular Q6H PRN [name], [name], MD      . divalproex (DEPAKOTE) DR tablet 1,000 mg  1,000 mg Oral QPM [name], [name], MD   1,000 mg at 07/12/18 2225  . divalproex (DEPAKOTE) DR tablet 500 mg  500 mg Oral BH-q7a [name], [name], MD   500 mg at 07/13/18 7867  . haloperidol (HALDOL) tablet 5 mg  5 mg Oral BID [name], [name], MD   5 mg at 07/13/18 0809  . hydrOXYzine (ATARAX/VISTARIL) tablet 50 mg  50 mg Oral TID PRN [name], [name], MD      . magnesium hydroxide (MILK OF MAGNESIA) suspension 30 mL  30 mL Oral Daily PRN [name], [name], MD      . multivitamin with minerals tablet 1 tablet  1 tablet Oral Daily [name], [name], MD   1 tablet at 07/13/18 [phone]  . omega-3 acid ethyl esters (LOVAZA) capsule 1 g  1 capsule Oral Daily [name], [name], MD   1 g at 07/13/18 0808  . traZODone (DESYREL) tablet 100 mg  100 mg Oral QHS PRN [name], [name], MD       PTA Medications: Medications Prior to Admission  Medication Sig Dispense Refill Last Dose  . ARIPiprazole ER (ABILIFY MAINTENA)  400 MG PRSY prefilled syringe Inject 400 mg into the muscle.   Past Month at Unknown time  . divalproex (DEPAKOTE) 500 MG DR tablet Take 1 tablet (500 mg total) by mouth See admin instructions. Take 1 tablet in the morning and 2 tablets at bedtime 500 mg qam and 1000 mg qpm 90 tablet 0   . haloperidol (HALDOL) 5 MG tablet Take 1 tablet (5 mg total) by mouth 2 (two) times daily. 60 tablet 0    . haloperidol decanoate (HALDOL DECANOATE) 100 MG/ML injection Inject 1 mL (100 mg total) into the muscle once for 1 dose. Next dose due 5/15 1 mL    . haloperidol decanoate (HALDOL DECANOATE) 100 MG/ML injection Inject 100 mg into the muscle every 30 (thirty) days.   Past Month at Unknown time  . Multiple Vitamin (MULTIVITAMIN) tablet Take 1 tablet by mouth daily.   Past Week at Unknown time  . Omega-3 Fatty Acids (OMEGA-3 FISH OIL PO) Take 1 capsule by mouth daily.   Past Week at Unknown time  . paliperidone (INVEGA SUSTENNA) 234 MG/1.5ML SUSP injection Inject 234 mg into the muscle once for 1 dose. Next dose due 4/23 1.5 mL 0     Musculoskeletal: Strength & Muscle Tone: within normal limits Gait & Station: normal Patient leans: N/A  Psychiatric Specialty Exam: Physical Exam  Nursing note and vitals reviewed. Constitutional: He is oriented to person, place, and time. He appears well-developed and well-nourished.  HENT:  Head: Normocephalic and atraumatic.  Eyes: Pupils are equal, round, and reactive to light. Conjunctivae and EOM are normal.  Neck: Normal range of motion. Neck supple. No thyromegaly present.  Cardiovascular: Normal rate, regular rhythm and normal heart sounds.  Respiratory: Effort normal and breath sounds normal. No respiratory distress.  GI: Soft. Bowel sounds are normal. He exhibits no distension. There is no abdominal tenderness.  Musculoskeletal: Normal range of motion.  Neurological: He is alert and oriented to person, place, and time. He has normal reflexes.  Skin: Skin is warm and dry. No rash noted.    Review of Systems  Constitutional: Negative.   HENT: Negative.   Eyes: Negative.   Respiratory: Negative.   Cardiovascular: Negative.   Gastrointestinal: Negative.   Genitourinary: Negative.   Musculoskeletal: Negative.   Skin: Negative.   Neurological: Negative.   Endo/Heme/Allergies: Negative.     Blood pressure 103/90, pulse (!) 121, temperature  97.6 F (36.4 C), temperature source Oral, resp. rate 18, height 5\' 9"  (1.753 m), weight 69.4 kg, SpO2 100 %.Body mass index is 22.59 kg/m.  General Appearance: Disheveled  Eye Contact:  Good  Speech:  Clear and Coherent and Normal Rate  Volume:  Normal  Mood:  "OK"  Affect:  Blunt  Thought Process:  Disorganized  Orientation:  Full (Time, Place, and Person)  Thought Content:  Illogical and Delusions  Suicidal Thoughts:  No  Homicidal Thoughts:  No  Memory:  Immediate;   Fair Recent;   Fair Remote;   Fair  Judgement:  Impaired  Insight:  Lacking  Psychomotor Activity:  Normal  Concentration:  Concentration: Fair and Attention Span: Fair  Recall:  [employer] of Knowledge:  Fair  Language:  Fair  Akathisia:  No  Handed:  Right  AIMS (if indicated):     Assets:  Catering manager Housing Physical Health  ADL's:  Intact  Cognition:  WNL  Sleep:  Number of Hours: 6.75    Treatment Plan Summary:  Schizoaffective Disorder, Bipolar Type  Dustin [name] is a 63 year old single African-American male with history of schizoaffective disorder, bipolar type currently followed by [name].  He was admitted to the hospital he was brought to the hospital after refusing a long-acting injection and acting in a hostile and psychotic manner.  He will be admitted to inpatient psychiatry for medication management, safety and stabilization.  Schizoaffective disorder, bipolar type: -We will plan to restart Depakote 500 mg p.o. daily and 1000mg  po nightly for mood stabilization.  -Valproic acid level was not checked at admission and will need to check valproic acid level.  LFTs within normal limits -The patient received Haldol Decanoate injection of 100 mg IM on 07/12/18.  He also has Haldol 5 mg p.o. twice daily oral for the next few days and then will discontinue Haldol. -It is unclear whether or not he should receive in [place] sustain injections.  We will get records from [facility]. -We will continue trazodone 100 mg p.o. nightly as needed for insomnia -Total cholesterol was 137 and hemoglobin A1c is pending -We will check EKG to rule out QTc prolongation  Tobacco use disorder:  -The patient refuses a nicotine patch  Disposition: -The patient has a stable living situation -Psychotropic medication management follow-up appointment will be scheduled with [name]    Daily contact with patient to assess and evaluate symptoms and progress in treatment and Medication management                   Physician Treatment Plan for Primary Diagnosis: Schizoaffective Disorder  Long Term Goal(s): Improvement in symptoms so as ready for discharge  Short Term Goals: Ability to identify changes in lifestyle to reduce recurrence of condition will improve, Ability to demonstrate self-control will improve and Compliance with prescribed medications will improve  Physician Treatment Plan for Secondary Diagnosis: Active Problems:   Schizoaffective disorder, bipolar type ([place])   Tobacco use disorder  Long Term Goal(s): Improvement in symptoms so as ready for discharge  Short Term Goals: Ability to identify changes in lifestyle to reduce recurrence of condition will improve, Ability to demonstrate self-control will improve and Compliance with prescribed medications will improve  I certify that inpatient services furnished can reasonably be expected to improve the patient's condition.    Chauncey Mann, MD 2/8/20202:18 PM

## 2018-07-14 LAB — VALPROIC ACID LEVEL: Valproic Acid Lvl: 82 ug/mL (ref 50.0–100.0)

## 2018-07-14 MED ORDER — PALIPERIDONE PALMITATE ER 234 MG/1.5ML IM SUSY
234.0000 mg | PREFILLED_SYRINGE | Freq: Once | INTRAMUSCULAR | Status: AC
  Administered 2018-07-14: 234 mg via INTRAMUSCULAR
  Filled 2018-07-14: qty 1.5

## 2018-07-14 MED ORDER — HALOPERIDOL 5 MG PO TABS
5.0000 mg | ORAL_TABLET | Freq: Two times a day (BID) | ORAL | Status: DC
  Administered 2018-07-14 – 2018-07-16 (×4): 5 mg via ORAL
  Filled 2018-07-14 (×4): qty 1

## 2018-07-14 NOTE — Progress Notes (Signed)
D: Pt denies SI/HI/AVH. Pt is pleasant and cooperative. Pt stated he was doing " better " due to being around people. Pt continues to have flight of ideas and bizarre statements. Pt continued to state he had hole in his head and had incoherent speech at times.   A: Pt was offered support and encouragement. Pt was given scheduled medications. Pt was encourage to attend groups. Q 15 minute checks were done for safety.   R:Pt attends groups and interacts well with peers and staff. Pt is taking medication. Pt has no complaints.Pt receptive to treatment and safety maintained on unit.   Problem: Activity: Goal: Interest or engagement in leisure activities will improve Outcome: Progressing   Problem: Coping: Goal: Coping ability will improve Outcome: Progressing   Problem: Self-Concept: Goal: Level of anxiety will decrease Outcome: Progressing

## 2018-07-14 NOTE — Progress Notes (Signed)
[facility] MD Progress Note  07/14/2018 12:27 PM Dustin [name]  MRN:  [id number]    Subjective:    The patient has been visible on the unit, interacting in the day room with peers.  He was in the day room most of the day yesterday and also attended groups.  He found the Bible in the day room that he likes.  There were some mild bizarre behaviors noted by nursing but the patient appeared calm and cooperative today.  He has been compliant with psychotropic medications.  He does regret not interacting with the act team prior to admission.  He is willing to continue on the injection medications.  He denies any current active or passive suicidal thoughts.  No current auditory or visual hallucinations.  He is mildly hyperreligious at baseline but no other manic behavior.  He denies any new somatic complaints.  He did have 2 vital signs with elevated heart rates but otherwise heart rate was normal yesterday.  Blood pressure is stable.  He slept fairly well last night and appetite is good.  Haldol decanoate injection given on February 7 and [place] injection scheduled for today.   Past medical history Tardive dyskinesia Denies prior surgeries  Family psychiatric history: He denies any history of any mental illness or substance use in the family  Social history: The patient is currently single, never married and no children.  He was raised in [place] and then later in [place] by both his biological parents.  He denies any history of any physical or sexual abuse.  He has 1/9 grade education and never obtained his GED.  He is currently unemployed and on disability  Substance abuse history: He drinks 7 shots of liquor per week per prior record but the patient himself denies any history of any heavy alcohol use or illicit drug use.  Urine tox screen was negative for all substances  Legal history: He does admit to some prior rest but cannot verbalize what the charges were.  He denies any  access to guns      Principal Problem: <principal problem not specified> Diagnosis: Active Problems:   Schizoaffective disorder, bipolar type (HCC)   Tobacco use disorder  Total Time spent with patient: 20 minutes    Past Medical History:  Past Medical History:  Diagnosis Date  . Schizophrenia ([place])    History reviewed. No pertinent surgical history.  Social History:  Social History   Substance and Sexual Activity  Alcohol Use Yes  . Alcohol/week: 7.0 standard drinks  . Types: 7 Shots of liquor per week   Comment: 1 drink every night- gin     Social History   Substance and Sexual Activity  Drug Use No    Social History   Socioeconomic History  . Marital status: Single    Spouse name: Not on file  . Number of children: Not on file  . Years of education: Not on file  . Highest education level: Not on file  Occupational History  . Not on file  Social Needs  . Financial resource strain: Not on file  . Food insecurity:    Worry: Not on file    Inability: Not on file  . Transportation needs:    Medical: Not on file    Non-medical: Not on file  Tobacco Use  . Smoking status: Current Every Day Smoker    Packs/day: 0.50    Years: 15.00    Pack years: 7.50  . Smokeless tobacco: Never Used  Substance and Sexual Activity  . Alcohol use: Yes    Alcohol/week: 7.0 standard drinks    Types: 7 Shots of liquor per week    Comment: 1 drink every night- gin  . Drug use: No  . Sexual activity: Never    Birth control/protection: None  Lifestyle  . Physical activity:    Days per week: Not on file    Minutes per session: Not on file  . Stress: Not on file  Relationships  . Social connections:    Talks on phone: Not on file    Gets together: Not on file    Attends religious service: Not on file    Active member of club or organization: Not on file    Attends meetings of clubs or organizations: Not on file    Relationship status: Not on file  Other Topics  Concern  . Not on file  Social History Narrative  . Not on file     Sleep: Good  Appetite:  Good  Current Medications: Current Facility-Administered Medications  Medication Dose Route Frequency Provider Last Rate Last Dose  . acetaminophen (TYLENOL) tablet 650 mg  650 mg Oral Q6H PRN [name], [name], MD      . alum & mag hydroxide-simeth (MAALOX/MYLANTA) 200-200-20 MG/5ML suspension 30 mL  30 mL Oral Q4H PRN [name], [name], MD      . diphenhydrAMINE (BENADRYL) capsule 50 mg  50 mg Oral Q6H PRN [name], [name], MD       Or  . diphenhydrAMINE (BENADRYL) injection 50 mg  50 mg Intramuscular Q6H PRN [name], [name], MD      . divalproex (DEPAKOTE) DR tablet 1,000 mg  1,000 mg Oral QPM [name], [name], MD   1,000 mg at 07/13/18 2142  . divalproex (DEPAKOTE) DR tablet 500 mg  500 mg Oral BH-q7a [name], [name], MD   500 mg at 07/14/18 2706  . haloperidol (HALDOL) tablet 5 mg  5 mg Oral BID Chauncey Mann, MD      . hydrOXYzine (ATARAX/VISTARIL) tablet 50 mg  50 mg Oral TID PRN [name], [name], MD      . magnesium hydroxide (MILK OF MAGNESIA) suspension 30 mL  30 mL Oral Daily PRN [name], [name], MD      . multivitamin with minerals tablet 1 tablet  1 tablet Oral Daily [name], [name], MD   1 tablet at 07/14/18 0757  . omega-3 acid ethyl esters (LOVAZA) capsule 1 g  1 capsule Oral Daily [name], [name], MD   1 g at 07/14/18 0757  . paliperidone (INVEGA SUSTENNA) injection 234 mg  234 mg Intramuscular Once Chauncey Mann, MD      . traZODone (DESYREL) tablet 100 mg  100 mg Oral QHS PRN [name], [name], MD   100 mg at 07/13/18 2141    Lab Results:  Results for orders placed or performed during the hospital encounter of 07/12/18 (from the past 48 hour(s))  Hemoglobin A1c     Status: None   Collection Time: 07/13/18  6:19 AM  Result Value Ref Range   Hgb A1c MFr Bld 5.6 4.8 - 5.6 %    Comment: (NOTE) Pre diabetes:          5.7%-6.4% Diabetes:              >6.4% Glycemic control for    <7.0% adults with diabetes    Mean Plasma Glucose 114.02 mg/dL    Comment:  Performed at [facility], [place] [address], [place], [place] [address]  Lipid panel     Status: None   Collection Time: 07/13/18  6:19 AM  Result Value Ref Range   Cholesterol 137 0 - 200 mg/dL   Triglycerides 40 <150 mg/dL   HDL 47 >40 mg/dL   Total CHOL/HDL Ratio 2.9 RATIO   VLDL 8 0 - 40 mg/dL   LDL Cholesterol 82 0 - 99 mg/dL    Comment:        Total Cholesterol/HDL:CHD Risk Coronary Heart Disease Risk Table                     Men   Women  1/2 Average Risk   3.4   3.3  Average Risk       5.0   4.4  2 X Average Risk   9.6   7.1  3 X Average Risk  23.4   11.0        Use the calculated Patient Ratio above and the CHD Risk Table to determine the patient's CHD Risk.        ATP III CLASSIFICATION (LDL):  <100     mg/dL   Optimal  100-129  mg/dL   Near or Above                    Optimal  130-159  mg/dL   Borderline  160-189  mg/dL   High  >190     mg/dL   Very High Performed at [facility], [address] [place], [place] [address]   TSH     Status: None   Collection Time: 07/13/18  6:19 AM  Result Value Ref Range   TSH 1.378 0.350 - 4.500 uIU/mL    Comment: Performed by a 3rd Generation assay with a functional sensitivity of <=0.01 uIU/mL. Performed at [facility], [address] [place], [place] [address]   Valproic acid level     Status: None   Collection Time: 07/14/18  6:31 AM  Result Value Ref Range   Valproic Acid Lvl 82 50.0 - 100.0 ug/mL    Comment: Performed at [facility], [address] [place],  [address]    Blood Alcohol level:  Lab Results  Component Value Date   [facility] <10 07/11/2018   ETH <10 78/93/8101    Metabolic Disorder Labs: Lab Results  Component Value Date   HGBA1C 5.6 07/13/2018   MPG 114.02 07/13/2018   MPG 108.28 08/17/2017   No results found for: PROLACTIN Lab Results  Component Value Date   CHOL 137  07/13/2018   TRIG 40 07/13/2018   HDL 47 07/13/2018   CHOLHDL 2.9 07/13/2018   VLDL 8 07/13/2018   LDLCALC 82 07/13/2018   LDLCALC 63 08/17/2017    Physical Findings: AIMS: Facial and Oral Movements Muscles of Facial Expression: None, normal Lips and Perioral Area: None, normal Jaw: None, normal Tongue: None, normal,Extremity Movements Upper (arms, wrists, hands, fingers): None, normal Lower (legs, knees, ankles, toes): None, normal, Trunk Movements Neck, shoulders, hips: None, normal, Overall Severity Severity of abnormal movements (highest score from questions above): None, normal Incapacitation due to abnormal movements: None, normal Patient's awareness of abnormal movements (rate only patient's report): No Awareness, Dental Status Current problems with teeth and/or dentures?: No Does patient usually wear dentures?: No  CIWA:  CIWA-Ar Total: 4 COWS:  COWS Total Score: 2  Musculoskeletal: Strength & Muscle Tone: within  normal limits Gait & Station: normal Patient leans: N/A  Psychiatric Specialty Exam: Physical Exam  Nursing note and vitals reviewed.   Review of Systems  Constitutional: Negative.   HENT: Negative.   Eyes: Negative.   Respiratory: Negative.   Cardiovascular: Negative.   Gastrointestinal: Negative.   Musculoskeletal: Negative.   Skin: Negative.   Neurological: Negative.   Endo/Heme/Allergies: Negative.     Blood pressure 93/70, pulse (!) 101, temperature 99 F (37.2 C), temperature source Oral, resp. rate 18, height 5\' 9"  (1.753 m), weight 69.4 kg, SpO2 100 %.Body mass index is 22.59 kg/m.  General Appearance: Casual  Eye Contact:  Good  Speech:  Clear and Coherent and Normal Rate  Volume:  Decreased  Mood:  "I am OK"  Affect:  Appropriate and Congruent  Thought Process: Mildly tnagential  Orientation:  Full (Time, Place, and Person)  Thought Content:  Tangential; no AH/VH  Suicidal Thoughts:  No  Homicidal Thoughts:  No  Memory:   Immediate;   Fair Recent;   Fair Remote;   Fair  Judgement:  Fair but poor by history  Insight:  Fair  Psychomotor Activity:  Normal  Concentration:  Concentration: Fair and Attention Span: Fair  Recall:  [employer] of Knowledge:  Fair  Language:  Fair  Akathisia:  No  Handed:  Right  AIMS (if indicated):     Assets:  Agricultural consultant Housing Physical Health  ADL's:  Intact  Cognition:  WNL  Sleep:  Number of Hours: 6.5     Treatment Plan Summary:  Schizoaffective Disorder, Bipolar Type  Mr. Vohs is a 63 year old single African-American male with history of schizoaffective disorder, bipolar type currently followed by [name].  He was admitted to the hospital he was brought to the hospital after refusing a long-acting injection and acting in a hostile and psychotic manner.  He will be admitted to inpatient psychiatry for medication management, safety and stabilization.  Schizoaffective disorder, bipolar type: -The patient was restarted on Depakote 500 mg p.o. daily and he tells milligrams p.o. nightly for mood stabilization.  Valproic acid level was 82 this morning. -He was given Haldol Decanoate injection of 100 mg on 07/12/18 an [place] 234 mg IM scheduled for 07/14/18.  Usually both are given on the same day per Dr. Doyne Keel from [facility] act team. Dr Darrall Dears was contacted by this writer who confirmed meds. SW also wrote a note to this effect -We will continue trazodone 100 mg p.o. nightly as needed for insomnia -Total cholesterol was 137 and hemoglobin A1c was 5.4 -EKG showed a QTc of 423  Tobacco use disorder: -Patient refused a nicotine patch -He was advised to stop smoking secondary to negative consequences of nicotine on health   Disposition: -He has a stable living situation -Psychotropic medication management will be with [name]   Daily contact with patient to assess and evaluate symptoms  and progress in treatment and Medication management  Chauncey Mann, MD 07/14/2018, 12:27 PM

## 2018-07-14 NOTE — BHH Group Notes (Signed)
LCSW Group Therapy Note 07/14/2018 1:15pm  Type of Therapy and Topic: Group Therapy: Feelings Around Returning Home & Establishing a Supportive Framework and Supporting Oneself When Supports Not Available  Participation Level: Did Not Attend  Description of Group:  Patients first processed thoughts and feelings about upcoming discharge. These included fears of upcoming changes, lack of change, new living environments, judgements and expectations from others and overall stigma of mental health issues. The group then discussed the definition of a supportive framework, what that looks and feels like, and how do to discern it from an unhealthy non-supportive network. The group identified different types of supports as well as what to do when your family/friends are less than helpful or unavailable  Therapeutic Goals  1. Patient will identify one healthy supportive network that they can use at discharge. 2. Patient will identify one factor of a supportive framework and how to tell it from an unhealthy network. 3. Patient able to identify one coping skill to use when they do not have positive supports from others. 4. Patient will demonstrate ability to communicate their needs through discussion and/or role plays.  Summary of Patient Progress:  Pt was invited to attend group but chose not to attend. CSW will continue to encourage pt to attend group throughout their admission.   Therapeutic Modalities Cognitive Behavioral Therapy Motivational Interviewing   Cheree Ditto, LCSW 07/14/2018 12:39 PM

## 2018-07-14 NOTE — Progress Notes (Addendum)
D- Patient alert and oriented. Patient presents in a preoccupied, but pleasant mood on assessment stating that he didn't get much rest last night "I fall asleep, but I'm not sleeping like I should". Patient had no other complaints to voice to this writer at this time. Patient denies SI, HI, AVH, and pain at this time, however, patient has been observed responding to internal stimuli. Patient also denies any signs/symptoms of depression and anxiety. Patient's goal for today is to "sleep".  A- Scheduled medications administered to patient, per MD orders. Support and encouragement provided.  Routine safety checks conducted every 15 minutes.  Patient informed to notify staff with problems or concerns.  R- No adverse drug reactions noted. Patient contracts for safety at this time. Patient compliant with medications and treatment plan. Patient receptive, calm, and cooperative. Patient interacts well with others on the unit.  Patient remains safe at this time.

## 2018-07-14 NOTE — Plan of Care (Signed)
Patient has been preoccupied with asking for information on different medications and has been disorganized in his speech talking about "there's a hole in my head right here" and also stating that "my ears are in my mouth". Patient did not attend social work, but has been present in the milieu throughout the day and then he returns to his room. Patient denies SI, HI, AVH, and pain at this time, however, patient has been observed responding to internal stimuli. Patient also denies any signs/symptoms of depression and anxiety to this Probation officer. Patient has used fair eye contact while communicating with this Probation officer. Patient has been free from injury thus far and remains safe on the unit at this time.  Problem: Education: Goal: Utilization of techniques to improve thought processes will improve Outcome: Progressing   Problem: Activity: Goal: Interest or engagement in leisure activities will improve Outcome: Progressing Goal: Imbalance in normal sleep/wake cycle will improve Outcome: Progressing   Problem: Coping: Goal: Coping ability will improve Outcome: Progressing Goal: Will verbalize feelings Outcome: Progressing   Problem: Education: Goal: Ability to state activities that reduce stress will improve Outcome: Progressing   Problem: Self-Concept: Goal: Ability to identify factors that promote anxiety will improve Outcome: Progressing Goal: Level of anxiety will decrease Outcome: Progressing Goal: Ability to modify response to factors that promote anxiety will improve Outcome: Progressing   Problem: Activity: Goal: Will identify at least one activity in which they can participate Outcome: Progressing   Problem: Coping: Goal: Ability to identify and develop effective coping behavior will improve Outcome: Progressing Goal: Ability to interact with others will improve Outcome: Progressing Goal: Demonstration of participation in decision-making regarding own care will improve Outcome:  Progressing Goal: Ability to use eye contact when communicating with others will improve Outcome: Progressing   Problem: Health Behavior/Discharge Planning: Goal: Identification of resources available to assist in meeting health care needs will improve Outcome: Progressing

## 2018-07-14 NOTE — Progress Notes (Signed)
Patient stayed in the dayroom until bedtime. Was engaged in activities with peers. Had a snack and received bedtime medications. Pleasant  But displaying some bizarre behaviors at times. Patient went to bed and slept throughout the night. Currently out of bed and sitting in the dayroom with peers, calm and cooperative. Staff continue to monitor for safety.

## 2018-07-15 NOTE — Plan of Care (Addendum)
Visible and active in the milieu. Pleasant and cooperative

## 2018-07-15 NOTE — BHH Group Notes (Signed)
[place] Group Notes:  (Nursing/MHT/Case Management/Adjunct)  Date:  07/15/2018  Time:  3:01 PM  Type of Therapy:  Psychoeducational Skills  Participation Level:  Did Not Attend    Geno Sydnor 07/15/2018, 3:01 PM

## 2018-07-15 NOTE — BHH Group Notes (Signed)
[place] Group Notes:  (Nursing/MHT/Case Management/Adjunct)  Date:  07/15/2018  Time:  11:38 PM  Type of Therapy:  Group Therapy  Participation Level:  Active  Participation Quality:  Appropriate  Affect:  Appropriate  Cognitive:  Appropriate  Insight:  Appropriate  Engagement in Group:  Engaged  Modes of Intervention:  Discussion  Summary of Progress/Problems:  Kandis Fantasia 07/15/2018, 11:38 PM

## 2018-07-15 NOTE — Tx Team (Signed)
Interdisciplinary Treatment and Diagnostic Plan Update  07/15/2018 Time of Session: 230pm Dustin [name] MRN: [id number]  Principal Diagnosis: <principal problem not specified>  Secondary Diagnoses: Active Problems:   Tobacco use disorder   Schizoaffective disorder, bipolar type (HCC)   Current Medications:  Current Facility-Administered Medications  Medication Dose Route Frequency Provider Last Rate Last Dose  . acetaminophen (TYLENOL) tablet 650 mg  650 mg Oral Q6H PRN [name], [name], MD      . alum & mag hydroxide-simeth (MAALOX/MYLANTA) 200-200-20 MG/5ML suspension 30 mL  30 mL Oral Q4H PRN [name], [name], MD      . diphenhydrAMINE (BENADRYL) capsule 50 mg  50 mg Oral Q6H PRN [name], [name], MD       Or  . diphenhydrAMINE (BENADRYL) injection 50 mg  50 mg Intramuscular Q6H PRN [name], [name], MD      . divalproex (DEPAKOTE) DR tablet 1,000 mg  1,000 mg Oral QPM [name], [name], MD   1,000 mg at 07/14/18 2109  . divalproex (DEPAKOTE) DR tablet 500 mg  500 mg Oral BH-q7a [name], [name], MD   500 mg at 07/15/18 0700  . haloperidol (HALDOL) tablet 5 mg  5 mg Oral BID Chauncey Mann, MD   5 mg at 07/15/18 7829  . hydrOXYzine (ATARAX/VISTARIL) tablet 50 mg  50 mg Oral TID PRN [name], [name], MD      . magnesium hydroxide (MILK OF MAGNESIA) suspension 30 mL  30 mL Oral Daily PRN [name], [name], MD      . multivitamin with minerals tablet 1 tablet  1 tablet Oral Daily [name], [name], MD   1 tablet at 07/15/18 [phone]  . omega-3 acid ethyl esters (LOVAZA) capsule 1 g  1 capsule Oral Daily [name], [name], MD   1 g at 07/15/18 0752  . traZODone (DESYREL) tablet 100 mg  100 mg Oral QHS PRN [name], [name], MD   100 mg at 07/14/18 2109   PTA Medications: Medications Prior to Admission  Medication Sig Dispense Refill Last Dose  . ARIPiprazole ER (ABILIFY MAINTENA) 400 MG PRSY prefilled syringe Inject 400 mg into the muscle.   Past Month at Unknown time  . divalproex (DEPAKOTE) 500 MG  DR tablet Take 1 tablet (500 mg total) by mouth See admin instructions. Take 1 tablet in the morning and 2 tablets at bedtime 500 mg qam and 1000 mg qpm 90 tablet 0   . haloperidol (HALDOL) 5 MG tablet Take 1 tablet (5 mg total) by mouth 2 (two) times daily. 60 tablet 0   . haloperidol decanoate (HALDOL DECANOATE) 100 MG/ML injection Inject 1 mL (100 mg total) into the muscle once for 1 dose. Next dose due 5/15 1 mL    . haloperidol decanoate (HALDOL DECANOATE) 100 MG/ML injection Inject 100 mg into the muscle every 30 (thirty) days.   Past Month at Unknown time  . Multiple Vitamin (MULTIVITAMIN) tablet Take 1 tablet by mouth daily.   Past Week at Unknown time  . Omega-3 Fatty Acids (OMEGA-3 FISH OIL PO) Take 1 capsule by mouth daily.   Past Week at Unknown time  . paliperidone (INVEGA SUSTENNA) 234 MG/1.5ML SUSP injection Inject 234 mg into the muscle once for 1 dose. Next dose due 4/23 1.5 mL 0     Patient Stressors: Financial difficulties Medication change or noncompliance Occupational concerns  Patient Strengths: Capable of independent living Motivation for treatment/growth Supportive family/friends  Treatment Modalities: Medication Management, Group  therapy, Case management,  1 to 1 session with clinician, Psychoeducation, Recreational therapy.   Physician Treatment Plan for Primary Diagnosis: <principal problem not specified> Long Term Goal(s): Improvement in symptoms so as ready for discharge Improvement in symptoms so as ready for discharge   Short Term Goals: Ability to identify changes in lifestyle to reduce recurrence of condition will improve Ability to demonstrate self-control will improve Compliance with prescribed medications will improve Ability to identify changes in lifestyle to reduce recurrence of condition will improve Ability to demonstrate self-control will improve Compliance with prescribed medications will improve  Medication Management: Evaluate patient's  response, side effects, and tolerance of medication regimen.  Therapeutic Interventions: 1 to 1 sessions, Unit Group sessions and Medication administration.  Evaluation of Outcomes: Progressing  Physician Treatment Plan for Secondary Diagnosis: Active Problems:   Tobacco use disorder   Schizoaffective disorder, bipolar type ([place])  Long Term Goal(s): Improvement in symptoms so as ready for discharge Improvement in symptoms so as ready for discharge   Short Term Goals: Ability to identify changes in lifestyle to reduce recurrence of condition will improve Ability to demonstrate self-control will improve Compliance with prescribed medications will improve Ability to identify changes in lifestyle to reduce recurrence of condition will improve Ability to demonstrate self-control will improve Compliance with prescribed medications will improve     Medication Management: Evaluate patient's response, side effects, and tolerance of medication regimen.  Therapeutic Interventions: 1 to 1 sessions, Unit Group sessions and Medication administration.  Evaluation of Outcomes: Progressing   RN Treatment Plan for Primary Diagnosis: <principal problem not specified> Long Term Goal(s): Knowledge of disease and therapeutic regimen to maintain health will improve  Short Term Goals: Ability to verbalize feelings will improve, Ability to disclose and discuss suicidal ideas, Ability to identify and develop effective coping behaviors will improve and Compliance with prescribed medications will improve  Medication Management: RN will administer medications as ordered by provider, will assess and evaluate patient's response and provide education to patient for prescribed medication. RN will report any adverse and/or side effects to prescribing provider.  Therapeutic Interventions: 1 on 1 counseling sessions, Psychoeducation, Medication administration, Evaluate responses to treatment, Monitor vital signs and  CBGs as ordered, Perform/monitor CIWA, COWS, AIMS and Fall Risk screenings as ordered, Perform wound care treatments as ordered.  Evaluation of Outcomes: Progressing   LCSW Treatment Plan for Primary Diagnosis: <principal problem not specified> Long Term Goal(s): Safe transition to appropriate next level of care at discharge, Engage patient in therapeutic group addressing interpersonal concerns.  Short Term Goals: Engage patient in aftercare planning with referrals and resources  Therapeutic Interventions: Assess for all discharge needs, 1 to 1 time with Social worker, Explore available resources and support systems, Assess for adequacy in community support network, Educate family and significant other(s) on suicide prevention, Complete Psychosocial Assessment, Interpersonal group therapy.  Evaluation of Outcomes: Progressing   Progress in Treatment: Attending groups: Yes. Participating in groups: Yes. Taking medication as prescribed: Yes. Toleration medication: Yes. Family/Significant other contact made: No, will contact:  pt declined Patient understands diagnosis: No. Discussing patient identified problems/goals with staff: Yes. Medical problems stabilized or resolved: Yes. Denies suicidal/homicidal ideation: Yes. Issues/concerns per patient self-inventory: No. Other: NA  New problem(s) identified: No, Describe:  none reported  New Short Term/Long Term Goal(s): "Cooperate with yall and do what yall want me to do"  Patient Goals:   "Cooperate with yall and do what yall want me to do"  Discharge Plan or Barriers: Pt  scheduled for d/c on 07/16/2018; pt will resume services with [facility]  Reason for Continuation of Hospitalization: Medication stabilization  Estimated Length of Stay: D/C 07/16/18  Attendees: Patient: Dustin [name] 07/15/2018 3:10 PM  Physician: Alethia Berthold 07/15/2018 3:10 PM  Nursing: Polly Cobia RN 07/15/2018 3:10 PM  RN Care Manager: 07/15/2018 3:10 PM   Social Worker: Sanjuana Kava LCSW Assunta Curtis LCSW 07/15/2018 3:10 PM  Recreational Therapist:  07/15/2018 3:10 PM  Other:  07/15/2018 3:10 PM  Other:  07/15/2018 3:10 PM  Other: 07/15/2018 3:10 PM    Scribe for Treatment Team: Yvette Rack, LCSW 07/15/2018 3:10 PM

## 2018-07-15 NOTE — [facility] Counselor (Signed)
[facility] team lead) informed CSW they can assist with providing transportation tomorrow(2/11) when pt is discharged. CSW relayed this information to pt's nurse(Gwen Rudie Meyer).

## 2018-07-15 NOTE — Plan of Care (Addendum)
Problem: Education: Goal: Utilization of techniques to improve thought processes will improve Note:  Patient remained  altered thought . Continue to voice of unrelated  topics  that are bizarre    Goal: Imbalance in normal sleep/wake cycle will improve Note:  Voice no concerns around  wake or sleep cycle    Problem: Coping: Goal: Coping ability will improve Note:  Patient able to  cope  with  being on the unit  Goal: Will verbalize feelings Note:  Able to let staff know of issues  going on . Continue to voice of wanting to go home    Problem: Education: Goal: Ability to state activities that reduce stress will improve Note:  Voice of no anxiety  concerns    Problem: Self-Concept: Goal: Ability to identify factors that promote anxiety will improve Note:  Patient  unable to identify  anxiety and  unable to explore  cognitively  reasons  that led up  to Problem: Activity: Goal: Interest or engagement in leisure activities will improve Note:  Limited involvement   with peers  on unit or unit programing  anxiety

## 2018-07-15 NOTE — Progress Notes (Signed)
[facility] MD Progress Note  07/15/2018 4:41 PM Dustin [name]  MRN:  [id number] Subjective: Follow-up patient with schizoaffective disorder.  Patient seen and also seen in treatment team.  Patient is denying suicidal or homicidal thoughts.  In conversation he remains very confusing to follow.  He does not seem distressed by it but most of his answers to questions are non sequiturs often with rambling and irrelevant calm tent.  Nevertheless he is able to take care of his basic ADLs has not been aggressive does not appear to necessarily respond to internal stimuli too much and he is not showing signs of acute dangerousness. Principal Problem: Schizoaffective disorder, bipolar type ([place]) Diagnosis: Principal Problem:   Schizoaffective disorder, bipolar type ([place]) Active Problems:   Tobacco use disorder  Total Time spent with patient: 20 minutes  Past Psychiatric History: Long history of chronic lifelong mental illness  Past Medical History:  Past Medical History:  Diagnosis Date  . Schizophrenia ([place])    History reviewed. No pertinent surgical history. Family History: History reviewed. No pertinent family history. Family Psychiatric  History: See note Social History:  Social History   Substance and Sexual Activity  Alcohol Use Yes  . Alcohol/week: 7.0 standard drinks  . Types: 7 Shots of liquor per week   Comment: 1 drink every night- gin     Social History   Substance and Sexual Activity  Drug Use No    Social History   Socioeconomic History  . Marital status: Single    Spouse name: Not on file  . Number of children: Not on file  . Years of education: Not on file  . Highest education level: Not on file  Occupational History  . Not on file  Social Needs  . Financial resource strain: Not on file  . Food insecurity:    Worry: Not on file    Inability: Not on file  . Transportation needs:    Medical: Not on file    Non-medical: Not on file  Tobacco Use  . Smoking status:  Current Every Day Smoker    Packs/day: 0.50    Years: 15.00    Pack years: 7.50  . Smokeless tobacco: Never Used  Substance and Sexual Activity  . Alcohol use: Yes    Alcohol/week: 7.0 standard drinks    Types: 7 Shots of liquor per week    Comment: 1 drink every night- gin  . Drug use: No  . Sexual activity: Never    Birth control/protection: None  Lifestyle  . Physical activity:    Days per week: Not on file    Minutes per session: Not on file  . Stress: Not on file  Relationships  . Social connections:    Talks on phone: Not on file    Gets together: Not on file    Attends religious service: Not on file    Active member of club or organization: Not on file    Attends meetings of clubs or organizations: Not on file    Relationship status: Not on file  Other Topics Concern  . Not on file  Social History Narrative  . Not on file   Additional Social History:                         Sleep: Fair  Appetite:  Fair  Current Medications: Current Facility-Administered Medications  Medication Dose Route Frequency Provider Last Rate Last Dose  . acetaminophen (TYLENOL) tablet 650 mg  650 mg Oral Q6H PRN Aerie Donica T, MD      . alum & mag hydroxide-simeth (MAALOX/MYLANTA) 200-200-20 MG/5ML suspension 30 mL  30 mL Oral Q4H PRN Mckaylee Dimalanta T, MD      . diphenhydrAMINE (BENADRYL) capsule 50 mg  50 mg Oral Q6H PRN Giovany Cosby T, MD       Or  . diphenhydrAMINE (BENADRYL) injection 50 mg  50 mg Intramuscular Q6H PRN Davyn Morandi T, MD      . divalproex (DEPAKOTE) DR tablet 1,000 mg  1,000 mg Oral QPM Oma Marzan, [name], MD   1,000 mg at 07/14/18 2109  . divalproex (DEPAKOTE) DR tablet 500 mg  500 mg Oral BH-q7a Dung Prien T, MD   500 mg at 07/15/18 0700  . haloperidol (HALDOL) tablet 5 mg  5 mg Oral BID Chauncey Mann, MD   5 mg at 07/15/18 6063  . hydrOXYzine (ATARAX/VISTARIL) tablet 50 mg  50 mg Oral TID PRN Ranard Harte, [name], MD      . magnesium hydroxide (MILK OF  MAGNESIA) suspension 30 mL  30 mL Oral Daily PRN Frida Wahlstrom T, MD      . multivitamin with minerals tablet 1 tablet  1 tablet Oral Daily Glena Pharris, [name], MD   1 tablet at 07/15/18 [phone]  . omega-3 acid ethyl esters (LOVAZA) capsule 1 g  1 capsule Oral Daily Kaysa Roulhac, [name], MD   1 g at 07/15/18 0752  . traZODone (DESYREL) tablet 100 mg  100 mg Oral QHS PRN Nikhil Osei, [name], MD   100 mg at 07/14/18 2109    Lab Results:  Results for orders placed or performed during the hospital encounter of 07/12/18 (from the past 48 hour(s))  Valproic acid level     Status: None   Collection Time: 07/14/18  6:31 AM  Result Value Ref Range   Valproic Acid Lvl 82 50.0 - 100.0 ug/mL    Comment: Performed at [facility], [address] [place], [place] [address]    Blood Alcohol level:  Lab Results  Component Value Date   [facility] <10 07/11/2018   ETH <10 35/57/3220    Metabolic Disorder Labs: Lab Results  Component Value Date   HGBA1C 5.6 07/13/2018   MPG 114.02 07/13/2018   MPG 108.28 08/17/2017   No results found for: PROLACTIN Lab Results  Component Value Date   CHOL 137 07/13/2018   TRIG 40 07/13/2018   HDL 47 07/13/2018   CHOLHDL 2.9 07/13/2018   VLDL 8 07/13/2018   LDLCALC 82 07/13/2018   LDLCALC 63 08/17/2017    Physical Findings: AIMS: Facial and Oral Movements Muscles of Facial Expression: None, normal Lips and Perioral Area: None, normal Jaw: None, normal Tongue: None, normal,Extremity Movements Upper (arms, wrists, hands, fingers): None, normal Lower (legs, knees, ankles, toes): None, normal, Trunk Movements Neck, shoulders, hips: None, normal, Overall Severity Severity of abnormal movements (highest score from questions above): None, normal Incapacitation due to abnormal movements: None, normal Patient's awareness of abnormal movements (rate only patient's report): No Awareness, Dental Status Current problems with teeth and/or dentures?: No Does patient usually  wear dentures?: No  CIWA:  CIWA-Ar Total: 4 COWS:  COWS Total Score: 2  Musculoskeletal: Strength & Muscle Tone: within normal limits Gait & Station: normal Patient leans: N/A  Psychiatric Specialty Exam: Physical Exam  Nursing note and vitals reviewed. Constitutional: He appears well-developed and well-nourished.  HENT:  Head: Normocephalic and atraumatic.  Eyes: Pupils are equal,  round, and reactive to light. Conjunctivae are normal.  Neck: Normal range of motion.  Cardiovascular: Normal heart sounds.  Respiratory: Effort normal.  GI: Soft.  Musculoskeletal: Normal range of motion.  Neurological: He is alert.  Skin: Skin is warm and dry.  Psychiatric: His affect is blunt. His speech is delayed and tangential. He is not agitated. Thought content is delusional. Cognition and memory are impaired. He expresses inappropriate judgment. He expresses no homicidal and no suicidal ideation.    Review of Systems  Constitutional: Negative.   HENT: Negative.   Eyes: Negative.   Respiratory: Negative.   Cardiovascular: Negative.   Gastrointestinal: Negative.   Musculoskeletal: Negative.   Skin: Negative.   Neurological: Negative.   Psychiatric/Behavioral: Negative.     Blood pressure 98/65, pulse (!) 56, temperature 97.6 F (36.4 C), temperature source Oral, resp. rate 18, height 5\' 9"  (1.753 m), weight 69.4 kg, SpO2 99 %.Body mass index is 22.59 kg/m.  General Appearance: Casual  Eye Contact:  Fair  Speech:  Slow  Volume:  Decreased  Mood:  Euthymic  Affect:  Congruent  Thought Process:  Irrelevant  Orientation:  Full (Time, Place, and Person)  Thought Content:  Illogical, Rumination and Tangential  Suicidal Thoughts:  No  Homicidal Thoughts:  No  Memory:  Immediate;   Fair Recent;   Fair Remote;   Fair  Judgement:  Impaired  Insight:  Shallow  Psychomotor Activity:  Decreased  Concentration:  Concentration: Poor  Recall:  Poor  Fund of Knowledge:  Fair  Language:   Fair  Akathisia:  No  Handed:  Right  AIMS (if indicated):     Assets:  Desire for Improvement Physical Health  ADL's:  Intact  Cognition:  Impaired,  Mild  Sleep:  Number of Hours: 3.5     Treatment Plan Summary: Daily contact with patient to assess and evaluate symptoms and progress in treatment, Medication management and Plan Stabilizing.  Tolerating medicine well.  Likely discharge home tomorrow with his act team.  Alethia Berthold, MD 07/15/2018, 4:41 PM

## 2018-07-15 NOTE — Plan of Care (Signed)
Visible in the milieu, pleasant and cooperative. Compliant with treatment

## 2018-07-15 NOTE — [facility] Group Notes (Signed)
LCSW Group Therapy Note   07/15/2018 2:09 PM   Type of Therapy and Topic:  Group Therapy:  Overcoming Obstacles   Participation Level:  Active   Description of Group:    In this group patients will be encouraged to explore what they see as obstacles to their own wellness and recovery. They will be guided to discuss their thoughts, feelings, and behaviors related to these obstacles. The group will process together ways to cope with barriers, with attention given to specific choices patients can make. Each patient will be challenged to identify changes they are motivated to make in order to overcome their obstacles. This group will be process-oriented, with patients participating in exploration of their own experiences as well as giving and receiving support and challenge from other group members.   Therapeutic Goals: 1. Patient will identify personal and current obstacles as they relate to admission. 2. Patient will identify barriers that currently interfere with their wellness or overcoming obstacles.  3. Patient will identify feelings, thought process and behaviors related to these barriers. 4. Patient will identify two changes they are willing to make to overcome these obstacles:      Summary of Patient Progress Pt was active and appropriate in group. Pt reported accepting his own actions as an obstacle for him and reported that sometimes we do not want to take responsibility for what we did and a way to overcome that obstacle is to take responsibility.     Therapeutic Modalities:   Cognitive Behavioral Therapy Solution Focused Therapy Motivational Interviewing Relapse Prevention Therapy  Evalina Field, MSW, LCSW Clinical Social Work 07/15/2018 2:09 PM

## 2018-07-16 MED ORDER — DIVALPROEX SODIUM 500 MG PO DR TAB: 500.0000 mg | DELAYED_RELEASE_TABLET | ORAL | 0 refills | Status: DC

## 2018-07-16 MED ORDER — HALOPERIDOL 5 MG PO TABS: 5.0000 mg | ORAL_TABLET | Freq: Two times a day (BID) | ORAL | 1 refills | Status: DC

## 2018-07-16 MED ORDER — HALOPERIDOL DECANOATE 100 MG/ML IM SOLN: 100.0000 mg | Freq: Once | INTRAMUSCULAR | 1 refills | Status: DC

## 2018-07-16 MED ORDER — DIVALPROEX SODIUM 500 MG PO DR TAB: 1000.0000 mg | DELAYED_RELEASE_TABLET | Freq: Every evening | ORAL | 0 refills | Status: DC

## 2018-07-16 NOTE — Discharge Summary (Signed)
Physician Discharge Summary Note  Patient:  Dustin [name] is an 63 y.o., male MRN:  [id number] DOB:  Oct 12, 1955 Patient phone:  [phone] (home)  Patient address:   [address] [place] [address],  Total Time spent with patient: 45 minutes  Date of Admission:  07/12/2018 Date of Discharge: July 16, 2018  Reason for Admission: Admitted through the emergency room because of concerns about psychosis and bizarre behavior and medication noncompliance by his act team  Principal Problem: Schizoaffective disorder, bipolar type [facility]) Discharge Diagnoses: Principal Problem:   Schizoaffective disorder, bipolar type ([place]) Active Problems:   Tobacco use disorder   Past Psychiatric History: Long history of chronic schizophrenia  Past Medical History:  Past Medical History:  Diagnosis Date  . Schizophrenia ([place])    History reviewed. No pertinent surgical history. Family History: History reviewed. No pertinent family history. Family Psychiatric  History: See previous notes Social History:  Social History   Substance and Sexual Activity  Alcohol Use Yes  . Alcohol/week: 7.0 standard drinks  . Types: 7 Shots of liquor per week   Comment: 1 drink every night- gin     Social History   Substance and Sexual Activity  Drug Use No    Social History   Socioeconomic History  . Marital status: Single    Spouse name: Not on file  . Number of children: Not on file  . Years of education: Not on file  . Highest education level: Not on file  Occupational History  . Not on file  Social Needs  . Financial resource strain: Not on file  . Food insecurity:    Worry: Not on file    Inability: Not on file  . Transportation needs:    Medical: Not on file    Non-medical: Not on file  Tobacco Use  . Smoking status: Current Every Day Smoker    Packs/day: 0.50    Years: 15.00    Pack years: 7.50  . Smokeless tobacco: Never Used  Substance and Sexual Activity  .  Alcohol use: Yes    Alcohol/week: 7.0 standard drinks    Types: 7 Shots of liquor per week    Comment: 1 drink every night- gin  . Drug use: No  . Sexual activity: Never    Birth control/protection: None  Lifestyle  . Physical activity:    Days per week: Not on file    Minutes per session: Not on file  . Stress: Not on file  Relationships  . Social connections:    Talks on phone: Not on file    Gets together: Not on file    Attends religious service: Not on file    Active member of club or organization: Not on file    Attends meetings of clubs or organizations: Not on file    Relationship status: Not on file  Other Topics Concern  . Not on file  Social History Narrative  . Not on file    Hospital Course: Patient was administered haloperidol decanoate while still in the emergency room.  Continued on oral Haldol and Depakote while in the hospital.  Affect and mood stabilized.  Patient was compliant with treatment.  Did not show any aggression or violence towards others.  Still has some disorganized thinking at baseline but denies that he is having current hallucinations.  Patient is alert and oriented.  Agreeable to medicine no new physical complaints.  Plan will be to discharge him to the  custody of his act team to follow-up in the community  Physical Findings: AIMS: Facial and Oral Movements Muscles of Facial Expression: None, normal Lips and Perioral Area: None, normal Jaw: None, normal Tongue: None, normal,Extremity Movements Upper (arms, wrists, hands, fingers): None, normal Lower (legs, knees, ankles, toes): None, normal, Trunk Movements Neck, shoulders, hips: None, normal, Overall Severity Severity of abnormal movements (highest score from questions above): None, normal Incapacitation due to abnormal movements: None, normal Patient's awareness of abnormal movements (rate only patient's report): No Awareness, Dental Status Current problems with teeth and/or dentures?:  No Does patient usually wear dentures?: No  CIWA:  CIWA-Ar Total: 4 COWS:  COWS Total Score: 2  Musculoskeletal: Strength & Muscle Tone: within normal limits Gait & Station: normal Patient leans: N/A  Psychiatric Specialty Exam: Physical Exam  Nursing note and vitals reviewed. Constitutional: He appears well-developed and well-nourished.  HENT:  Head: Normocephalic and atraumatic.  Eyes: Pupils are equal, round, and reactive to light. Conjunctivae are normal.  Neck: Normal range of motion.  Cardiovascular: Regular rhythm and normal heart sounds.  Respiratory: Effort normal.  GI: Soft.  Musculoskeletal: Normal range of motion.  Neurological: He is alert.  Skin: Skin is warm and dry.  Psychiatric: His affect is blunt. His speech is delayed. He is slowed. Thought content is not paranoid. Cognition and memory are impaired. He expresses impulsivity. He expresses no homicidal and no suicidal ideation.    Review of Systems  Constitutional: Negative.   HENT: Negative.   Eyes: Negative.   Respiratory: Negative.   Cardiovascular: Negative.   Gastrointestinal: Negative.   Musculoskeletal: Negative.   Skin: Negative.   Neurological: Negative.   Psychiatric/Behavioral: Negative.     Blood pressure 113/88, pulse 98, temperature 98.3 F (36.8 C), temperature source Oral, resp. rate 18, height 5\' 9"  (1.753 m), weight 69.4 kg, SpO2 100 %.Body mass index is 22.59 kg/m.  General Appearance: Disheveled  Eye Contact:  Fair  Speech:  Slow  Volume:  Decreased  Mood:  Dysphoric  Affect:  Constricted  Thought Process:  Disorganized  Orientation:  Full (Time, Place, and Person)  Thought Content:  Illogical  Suicidal Thoughts:  No  Homicidal Thoughts:  No  Memory:  Immediate;   Fair Recent;   Fair Remote;   Fair  Judgement:  Fair  Insight:  Fair  Psychomotor Activity:  Decreased  Concentration:  Concentration: Fair  Recall:  [place] of Knowledge:  Fair  Language:  Fair   Akathisia:  No  Handed:  Right  AIMS (if indicated):     Assets:  Desire for Improvement Housing  ADL's:  Intact  Cognition:  WNL  Sleep:  Number of Hours: 5     Have you used any form of tobacco in the last 30 days? (Cigarettes, Smokeless Tobacco, Cigars, and/or Pipes): Yes  Has this patient used any form of tobacco in the last 30 days? (Cigarettes, Smokeless Tobacco, Cigars, and/or Pipes) Yes, Yes, A prescription for an FDA-approved tobacco cessation medication was offered at discharge and the patient refused  Blood Alcohol level:  Lab Results  Component Value Date   [facility] <10 07/11/2018   ETH <10 16/03/9603    Metabolic Disorder Labs:  Lab Results  Component Value Date   HGBA1C 5.6 07/13/2018   MPG 114.02 07/13/2018   MPG 108.28 08/17/2017   No results found for: PROLACTIN Lab Results  Component Value Date   CHOL 137 07/13/2018   TRIG 40 07/13/2018   HDL 47  07/13/2018   CHOLHDL 2.9 07/13/2018   VLDL 8 07/13/2018   LDLCALC 82 07/13/2018   LDLCALC 63 08/17/2017    See Psychiatric Specialty Exam and Suicide Risk Assessment completed by Attending Physician prior to discharge.  Discharge destination:  Home  Is patient on multiple antipsychotic therapies at discharge:  No   Has Patient had three or more failed trials of antipsychotic monotherapy by history:  No  Recommended Plan for Multiple Antipsychotic Therapies: NA  Discharge Instructions    Diet - low sodium heart healthy   Complete by:  As directed    Increase activity slowly   Complete by:  As directed      Allergies as of 07/16/2018   No Known Allergies     Medication List    STOP taking these medications   ARIPiprazole ER 400 MG Prsy prefilled syringe Commonly known as:  ABILIFY MAINTENA   multivitamin tablet   OMEGA-3 FISH OIL PO   paliperidone 234 MG/1.5ML Susp injection Commonly known as:  INVEGA SUSTENNA     TAKE these medications     Indication  divalproex 500 MG DR  tablet Commonly known as:  DEPAKOTE Take 2 tablets (1,000 mg total) by mouth every evening. What changed:  You were already taking a medication with the same name, and this prescription was added. Make sure you understand how and when to take each.  Indication:  Schizophrenia, schizoaffective bipolar type   divalproex 500 MG DR tablet Commonly known as:  DEPAKOTE Take 1 tablet (500 mg total) by mouth every morning. Start taking on:  July 17, 2018 What changed:    when to take this  additional instructions  Indication:  Schizophrenia, schizoaffective bipolar type   haloperidol 5 MG tablet Commonly known as:  HALDOL Take 1 tablet (5 mg total) by mouth 2 (two) times daily.  Indication:  Psychosis   haloperidol decanoate 100 MG/ML injection Commonly known as:  HALDOL DECANOATE Inject 1 mL (100 mg total) into the muscle once for 1 dose. Next dose due 5/15 What changed:  Another medication with the same name was removed. Continue taking this medication, and follow the directions you see here.  Indication:  Schizophrenia      Follow-up Information    [place] on 07/16/2018.   Why:  Please follow up with your ACT team on Tuesday, July 16, 2018. Thank you. Contact information: [place] [address] [phone]           Follow-up recommendations:  Activity:  Activity as tolerated Diet:  Regular diet Other:  Follow-up with act team  Comments: Patient counseled about the importance of medication compliance.  He agrees to the current treatment plan.  Signed: Alethia Berthold, MD 07/16/2018, 9:36 AM

## 2018-07-16 NOTE — BHH Suicide Risk Assessment (Signed)
[facility] Discharge Suicide Risk Assessment   Principal Problem: Schizoaffective disorder, bipolar type [facility]) Discharge Diagnoses: Principal Problem:   Schizoaffective disorder, bipolar type ([place]) Active Problems:   Tobacco use disorder   Total Time spent with patient: 45 minutes  Musculoskeletal: Strength & Muscle Tone: within normal limits Gait & Station: normal Patient leans: N/A  Psychiatric Specialty Exam: Review of Systems  Constitutional: Negative.   HENT: Negative.   Eyes: Negative.   Respiratory: Negative.   Cardiovascular: Negative.   Gastrointestinal: Negative.   Musculoskeletal: Negative.   Skin: Negative.   Neurological: Negative.   Psychiatric/Behavioral: Negative for depression, hallucinations, memory loss, substance abuse and suicidal ideas. The patient is not nervous/anxious and does not have insomnia.     Blood pressure 113/88, pulse 98, temperature 98.3 F (36.8 C), temperature source Oral, resp. rate 18, height 5\' 9"  (1.753 m), weight 69.4 kg, SpO2 100 %.Body mass index is 22.59 kg/m.  General Appearance: Casual  Eye Contact::  Fair  Speech:  Slow409  Volume:  Decreased  Mood:  Euthymic  Affect:  Constricted  Thought Process:  Disorganized  Orientation:  Full (Time, Place, and Person)  Thought Content:  Illogical, Paranoid Ideation, Rumination and Tangential  Suicidal Thoughts:  No  Homicidal Thoughts:  No  Memory:  Immediate;   Fair Recent;   Poor Remote;   Poor  Judgement:  Fair  Insight:  Lacking  Psychomotor Activity:  Decreased  Concentration:  Fair  Recall:  [employer] of Knowledge:Fair  Language: Fair  Akathisia:  No  Handed:  Right  AIMS (if indicated):     Assets:  Desire for Improvement Housing  Sleep:  Number of Hours: 5  Cognition: Impaired,  Mild  ADL's:  Impaired   Mental Status Per Nursing Assessment::   On Admission:  NA  Demographic Factors:  Male and Low socioeconomic status  Loss Factors: Decrease in vocational  status  Historical Factors: Impulsivity  Risk Reduction Factors:   Religious beliefs about death, Living with another person, especially a relative, Positive social support and Positive therapeutic relationship  Continued Clinical Symptoms:  Schizophrenia:   Paranoid or undifferentiated type  Cognitive Features That Contribute To Risk:  Closed-mindedness    Suicide Risk:  Minimal: No identifiable suicidal ideation.  Patients presenting with no risk factors but with morbid ruminations; may be classified as minimal risk based on the severity of the depressive symptoms  Follow-up [place].. Go on 07/16/2018.   Why:  Please follow up with your ACT team on Tuesday, July 16, 2018. Thank you. Contact information: [place] [address] [phone]           Plan Of Care/Follow-up recommendations:  Activity:  Activity as tolerated Diet:  Regular diet Other:  Follow-up with outpatient treatment with her act team  Alethia Berthold, MD 07/16/2018, 9:26 AM

## 2018-07-16 NOTE — Progress Notes (Signed)
  [facility] Adult Case Management Discharge Plan :  Will you be returning to the same living situation after discharge:  Yes,  pt lives with his brother At discharge, do you have transportation home?: Yes,  ACTT will provide transportation Do you have the ability to pay for your medications: Yes,  insurance  Release of information consent forms completed and in the chart;  Patient's signature needed at discharge.  Patient to Follow up at: Follow-up [facility] on 07/16/2018.   Why:  Please follow up with your ACT team on Tuesday, July 16, 2018. Thank you. Contact information: [place] [address] [phone]           Next level of care provider has access to [place] and Suicide Prevention discussed: Yes,  with pt; pt declined family contact  Have you used any form of tobacco in the last 30 days? (Cigarettes, Smokeless Tobacco, Cigars, and/or Pipes): Yes  Has patient been referred to the Quitline?: Physician counseled pt on smoking cessation  Patient has been referred for addiction treatment: N/A  Yvette Rack, LCSW 07/16/2018, 9:26 AM

## 2018-07-16 NOTE — Progress Notes (Signed)
D: Patient is aware of  Discharge this shift .Patient denies suicidal /homicidal ideations. Patient received all belongings brought in   A: No Storage medications. Writer reviewed Discharge Summary, Suicide Risk Assessment, and Transitional Record. Patient also received Prescriptions   from  MD. Laqueta Due  Of follow up appointment .  R: Patient left unit with no questions  Or concerns  With ACT Team  Staff

## 2021-10-03 ENCOUNTER — Encounter: Payer: Self-pay | Admitting: Nurse Practitioner

## 2021-10-03 ENCOUNTER — Ambulatory Visit (INDEPENDENT_AMBULATORY_CARE_PROVIDER_SITE_OTHER): Payer: Medicare Other | Admitting: Nurse Practitioner

## 2021-10-03 ENCOUNTER — Ambulatory Visit
Admission: RE | Admit: 2021-10-03 | Discharge: 2021-10-03 | Disposition: A | Discharge status: Home / Self Care | Payer: Medicare Other | Source: Ambulatory Visit | Attending: Nurse Practitioner | Admitting: Nurse Practitioner

## 2021-10-03 ENCOUNTER — Ambulatory Visit
Admission: RE | Admit: 2021-10-03 | Discharge: 2021-10-03 | Disposition: A | Discharge status: Home / Self Care | Payer: Medicare Other | Source: Home / Self Care | Attending: Nurse Practitioner | Admitting: Nurse Practitioner

## 2021-10-03 VITALS — BP 107/75 | HR 85 | Temp 98.2°F | Resp 16 | Ht 67.0 in | Wt 138.6 lb

## 2021-10-03 DIAGNOSIS — D62 Acute posthemorrhagic anemia: Secondary | ICD-10-CM | POA: Diagnosis not present

## 2021-10-03 DIAGNOSIS — R634 Abnormal weight loss: Secondary | ICD-10-CM | POA: Insufficient documentation

## 2021-10-03 DIAGNOSIS — D374 Neoplasm of uncertain behavior of colon: Secondary | ICD-10-CM | POA: Diagnosis not present

## 2021-10-03 DIAGNOSIS — F419 Anxiety disorder, unspecified: Secondary | ICD-10-CM | POA: Diagnosis present

## 2021-10-03 DIAGNOSIS — E559 Vitamin D deficiency, unspecified: Secondary | ICD-10-CM | POA: Diagnosis not present

## 2021-10-03 DIAGNOSIS — K648 Other hemorrhoids: Secondary | ICD-10-CM | POA: Diagnosis not present

## 2021-10-03 DIAGNOSIS — M6281 Muscle weakness (generalized): Secondary | ICD-10-CM

## 2021-10-03 DIAGNOSIS — K5730 Diverticulosis of large intestine without perforation or abscess without bleeding: Secondary | ICD-10-CM | POA: Diagnosis not present

## 2021-10-03 DIAGNOSIS — F17200 Nicotine dependence, unspecified, uncomplicated: Secondary | ICD-10-CM | POA: Diagnosis not present

## 2021-10-03 DIAGNOSIS — K3189 Other diseases of stomach and duodenum: Secondary | ICD-10-CM | POA: Diagnosis not present

## 2021-10-03 DIAGNOSIS — B9681 Helicobacter pylori [H. pylori] as the cause of diseases classified elsewhere: Secondary | ICD-10-CM | POA: Diagnosis not present

## 2021-10-03 DIAGNOSIS — K649 Unspecified hemorrhoids: Secondary | ICD-10-CM | POA: Diagnosis not present

## 2021-10-03 DIAGNOSIS — R9431 Abnormal electrocardiogram [ECG] [EKG]: Secondary | ICD-10-CM | POA: Diagnosis not present

## 2021-10-03 DIAGNOSIS — J439 Emphysema, unspecified: Secondary | ICD-10-CM | POA: Diagnosis not present

## 2021-10-03 DIAGNOSIS — H9193 Unspecified hearing loss, bilateral: Secondary | ICD-10-CM | POA: Diagnosis not present

## 2021-10-03 DIAGNOSIS — Z125 Encounter for screening for malignant neoplasm of prostate: Secondary | ICD-10-CM

## 2021-10-03 DIAGNOSIS — K2970 Gastritis, unspecified, without bleeding: Secondary | ICD-10-CM | POA: Diagnosis not present

## 2021-10-03 DIAGNOSIS — R2689 Other abnormalities of gait and mobility: Secondary | ICD-10-CM

## 2021-10-03 DIAGNOSIS — K439 Ventral hernia without obstruction or gangrene: Secondary | ICD-10-CM | POA: Diagnosis not present

## 2021-10-03 DIAGNOSIS — C182 Malignant neoplasm of ascending colon: Secondary | ICD-10-CM | POA: Diagnosis not present

## 2021-10-03 DIAGNOSIS — K2950 Unspecified chronic gastritis without bleeding: Secondary | ICD-10-CM | POA: Diagnosis not present

## 2021-10-03 DIAGNOSIS — K643 Fourth degree hemorrhoids: Secondary | ICD-10-CM | POA: Diagnosis not present

## 2021-10-03 DIAGNOSIS — D649 Anemia, unspecified: Secondary | ICD-10-CM | POA: Diagnosis not present

## 2021-10-03 DIAGNOSIS — Z743 Need for continuous supervision: Secondary | ICD-10-CM | POA: Diagnosis not present

## 2021-10-03 DIAGNOSIS — D509 Iron deficiency anemia, unspecified: Secondary | ICD-10-CM | POA: Diagnosis not present

## 2021-10-03 DIAGNOSIS — M47817 Spondylosis without myelopathy or radiculopathy, lumbosacral region: Secondary | ICD-10-CM | POA: Diagnosis not present

## 2021-10-03 DIAGNOSIS — K6389 Other specified diseases of intestine: Secondary | ICD-10-CM | POA: Diagnosis not present

## 2021-10-03 DIAGNOSIS — R1314 Dysphagia, pharyngoesophageal phase: Secondary | ICD-10-CM | POA: Diagnosis not present

## 2021-10-03 DIAGNOSIS — C189 Malignant neoplasm of colon, unspecified: Secondary | ICD-10-CM | POA: Diagnosis not present

## 2021-10-03 DIAGNOSIS — Z7689 Persons encountering health services in other specified circumstances: Secondary | ICD-10-CM

## 2021-10-03 DIAGNOSIS — R55 Syncope and collapse: Secondary | ICD-10-CM | POA: Diagnosis not present

## 2021-10-03 DIAGNOSIS — R404 Transient alteration of awareness: Secondary | ICD-10-CM | POA: Diagnosis not present

## 2021-10-03 DIAGNOSIS — I714 Abdominal aortic aneurysm, without rupture, unspecified: Secondary | ICD-10-CM | POA: Diagnosis not present

## 2021-10-03 DIAGNOSIS — G2401 Drug induced subacute dyskinesia: Secondary | ICD-10-CM | POA: Diagnosis not present

## 2021-10-03 DIAGNOSIS — R5381 Other malaise: Secondary | ICD-10-CM | POA: Diagnosis not present

## 2021-10-03 DIAGNOSIS — R531 Weakness: Secondary | ICD-10-CM | POA: Diagnosis not present

## 2021-10-03 DIAGNOSIS — D122 Benign neoplasm of ascending colon: Secondary | ICD-10-CM | POA: Diagnosis not present

## 2021-10-03 DIAGNOSIS — I2510 Atherosclerotic heart disease of native coronary artery without angina pectoris: Secondary | ICD-10-CM | POA: Diagnosis not present

## 2021-10-03 DIAGNOSIS — F250 Schizoaffective disorder, bipolar type: Secondary | ICD-10-CM | POA: Diagnosis present

## 2021-10-03 DIAGNOSIS — F17210 Nicotine dependence, cigarettes, uncomplicated: Secondary | ICD-10-CM | POA: Diagnosis not present

## 2021-10-03 DIAGNOSIS — R631 Polydipsia: Secondary | ICD-10-CM | POA: Diagnosis not present

## 2021-10-03 DIAGNOSIS — Z79899 Other long term (current) drug therapy: Secondary | ICD-10-CM | POA: Diagnosis not present

## 2021-10-03 DIAGNOSIS — D508 Other iron deficiency anemias: Secondary | ICD-10-CM | POA: Diagnosis not present

## 2021-10-03 DIAGNOSIS — D500 Iron deficiency anemia secondary to blood loss (chronic): Secondary | ICD-10-CM | POA: Diagnosis not present

## 2021-10-03 DIAGNOSIS — J432 Centrilobular emphysema: Secondary | ICD-10-CM | POA: Diagnosis not present

## 2021-10-03 DIAGNOSIS — N200 Calculus of kidney: Secondary | ICD-10-CM | POA: Diagnosis not present

## 2021-10-03 DIAGNOSIS — R001 Bradycardia, unspecified: Secondary | ICD-10-CM | POA: Diagnosis not present

## 2021-10-03 DIAGNOSIS — K922 Gastrointestinal hemorrhage, unspecified: Secondary | ICD-10-CM | POA: Diagnosis not present

## 2021-10-03 DIAGNOSIS — W19XXXA Unspecified fall, initial encounter: Secondary | ICD-10-CM | POA: Diagnosis present

## 2021-10-03 DIAGNOSIS — E782 Mixed hyperlipidemia: Secondary | ICD-10-CM | POA: Diagnosis not present

## 2021-10-03 DIAGNOSIS — K921 Melena: Secondary | ICD-10-CM | POA: Diagnosis not present

## 2021-10-03 LAB — POCT URINALYSIS DIPSTICK
Blood, UA: NEGATIVE
Glucose, UA: NEGATIVE
Leukocytes, UA: NEGATIVE
Nitrite, UA: NEGATIVE
Protein, UA: NEGATIVE
Spec Grav, UA: 1.015 (ref 1.010–1.025)
Urobilinogen, UA: NEGATIVE E.U./dL — AB
pH, UA: 7.5 (ref 5.0–8.0)

## 2021-10-03 LAB — POCT GLYCOSYLATED HEMOGLOBIN (HGB A1C): Hemoglobin A1C: 5 % (ref 4.0–5.6)

## 2021-10-03 NOTE — Progress Notes (Signed)
[facility] ?[address] ?[place], [place] [address] ? ?Internal MEDICINE  ?Office Visit Note ? ?Patient Name: Dustin [name] ? [id number]  ?[id number] ? ?Date of Service: 10/04/2021 ? ? ?Complaints/HPI ?Pt is here for establishment of PCP. ?Chief Complaint  ?Patient presents with  ? New Patient (Initial Visit)  ?  Discuss weight loss, balance is off, discuss bipolar and schizophrenia  ? ?HPI ?Dustin [name] presents for a new patient visit to establish care.  He is a 66 year old male accompanied by his parents to the office visit today.  He has schizoaffective disorder and is on decanoate injections via his psychiatrist and he takes haloperidol decanoate once a month and [place] injection once a month.  He has no other significant or chronic medical problems.  He has had no surgeries.  His family history is significant for hypertension, lung cancer in father, and prostate cancer in 1 brother ?He lives by himself, he does not work, he reports that he eats 3 meals a day.  He denies any new or worsening pains.  He reports that he does smoke approximately 1 pack/day of cigarettes, he denies any alcohol use or recreational drug use.   ?--He reports that he has lost "a lot "of weight.  In reviewing the chart, he has lost approximately 15 pounds since February 2020 which is the last recorded weight on his chart.  He has also been having a an issue with his balance when he is standing and walking.  He reports that he feels like his legs are very weak and he is unable to stand up straight when he is standing or walking.  In observing his gait he does not shuffle when walking and does completely pick up each foot and set them down again.  He does seem to be weak and unsteady but this seems to be more related to lack of muscle strength or possible muscle wasting.  His physical appearance is deconditioned. ?He also reports increased thirst and reports that he is drinking more than he usually would and feels like he  is urinating more often.  Urinalysis was done in office and was negative for UTI.  Hemoglobin A1c was checked to rule out possible diabetes and his A1c was normal at 5.0. ?--His blood pressure and other vital signs are within normal limits and although he reports that he feels like he is losing weight and his parents do also corroborate his statement his BMI is within normal limits and his weight is 138 pounds.  ?He is overdue for routine labs, recommended preventive screenings, routine annual physical exam. ? ? ? ?Current Medication: ?Outpatient Encounter Medications as of 10/03/2021  ?Medication Sig  ? haloperidol decanoate (HALDOL DECANOATE) 100 MG/ML injection Inject 1 mL (100 mg total) into the muscle once for 1 dose. Next dose due 5/15  ? INVEGA SUSTENNA 234 MG/1.5ML injection Inject into the muscle.  ? [DISCONTINUED] divalproex (DEPAKOTE) 500 MG DR tablet Take 1 tablet (500 mg total) by mouth every morning. (Patient not taking: Reported on 10/03/2021)  ? [DISCONTINUED] divalproex (DEPAKOTE) 500 MG DR tablet Take 2 tablets (1,000 mg total) by mouth every evening. (Patient not taking: Reported on 10/03/2021)  ? [DISCONTINUED] haloperidol (HALDOL) 5 MG tablet Take 1 tablet (5 mg total) by mouth 2 (two) times daily. (Patient not taking: Reported on 10/03/2021)  ? ?No facility-administered encounter medications on file as of 10/03/2021.  ? ? ?Surgical History: ?History reviewed. No pertinent surgical history. ? ?Medical History: ?Past Medical  History:  ?Diagnosis Date  ? Schizophrenia ([place])   ? ? ?Family History: ?Family History  ?Problem Relation Age of Onset  ? Lung cancer Father   ? Hypertension Brother   ? Hypertension Brother   ? Hypertension Brother   ? Prostate cancer Brother   ? ? ?Social History  ? ?Socioeconomic History  ? Marital status: Single  ?  Spouse name: Not on file  ? Number of children: Not on file  ? Years of education: Not on file  ? Highest education level: Not on file  ?Occupational History  ? Not on  file  ?Tobacco Use  ? Smoking status: Every Day  ?  Packs/day: 0.50  ?  Years: 15.00  ?  Pack years: 7.50  ?  Types: Cigarettes  ? Smokeless tobacco: Never  ?Vaping Use  ? Vaping Use: Never used  ?Substance and Sexual Activity  ? Alcohol use: Not Currently  ? Drug use: No  ? Sexual activity: Never  ?  Birth control/protection: None  ?Other Topics Concern  ? Not on file  ?Social History Narrative  ? Not on file  ? ?Social Determinants of Health  ? ?Financial Resource Strain: Not on file  ?Food Insecurity: Not on file  ?Transportation Needs: Not on file  ?Physical Activity: Not on file  ?Stress: Not on file  ?Social Connections: Not on file  ?Intimate Partner Violence: Not on file  ? ? ? ?Review of Systems  ?Constitutional:  Positive for fatigue and unexpected weight change.  ?HENT: Negative.  Negative for tinnitus and trouble swallowing.   ?Eyes: Negative.   ?Respiratory: Negative.  Negative for cough, chest tightness, shortness of breath and wheezing.   ?Cardiovascular: Negative.  Negative for chest pain, palpitations and leg swelling.  ?Gastrointestinal: Negative.  Negative for abdominal pain, blood in stool, constipation, diarrhea, nausea and vomiting.  ?Endocrine: Positive for polydipsia and polyuria.  ?Genitourinary:  Positive for frequency. Negative for penile discharge, penile pain, penile swelling, scrotal swelling and urgency.  ?Musculoskeletal:  Positive for gait problem.  ?Skin: Negative.  Negative for rash.  ?Allergic/Immunologic: Negative for immunocompromised state.  ?Neurological:  Positive for weakness. Negative for dizziness, tremors, seizures, syncope, light-headedness and headaches.  ?Hematological: Negative.  Negative for adenopathy. Does not bruise/bleed easily.  ?Psychiatric/Behavioral:  Negative for agitation, behavioral problems, dysphoric mood, hallucinations and sleep disturbance. The patient is not nervous/anxious.   ?     Patient has a diagnosis of schizoaffective disorder and he is on 2  long-acting antipsychotic medications in injection form.  His symptoms seem to be controlled and he at his baseline per his parents report  ? ?Vital Signs: ?BP 107/75   Pulse 85   Temp 98.2 ?F (36.8 ?C)   Resp 16   Ht _0  (1.702 m)   Wt 138 lb 9.6 oz (62.9 kg)   SpO2 98%   BMI 21.71 kg/m?  ? ? ?Physical Exam ?Vitals reviewed.  ?Constitutional:   ?   General: He is awake. He is not in acute distress. ?   Appearance: He is cachectic. He is ill-appearing.  ?HENT:  ?   Head: Normocephalic and atraumatic.  ?Cardiovascular:  ?   Rate and Rhythm: Normal rate and regular rhythm.  ?Pulmonary:  ?   Effort: Pulmonary effort is normal. No accessory muscle usage or respiratory distress.  ?Abdominal:  ?   General: Abdomen is flat.  ?Musculoskeletal:  ?   Right lower leg: No edema.  ?   Left lower leg:  No edema.  ?Neurological:  ?   Mental Status: He is alert and oriented to person, place, and time.  ?   Motor: Weakness present. No tremor.  ?   Gait: Gait abnormal.  ?Psychiatric:     ?   Behavior: Behavior is cooperative.  ? ? ? ? ?Assessment/Plan: ?1. Recent unintentional weight loss over several months ?Labs ordered to rule out possible causes including anemia, iron deficiency, vitamin deficiencies, thyroid problems, inflammatory and autoimmune processes, infectious diseases, chest x-ray ordered to rule out pulmonary nodules due to patient being a current smoker.  Urinalysis done to rule out UTI.  Urinalysis was negative.  We will follow-up in 2 weeks to review labs and test and imaging.  If any labs results as critical and needing immediate follow-up, patient and/or designated family member will be contacted and notified of critical lab as well as intervention or orders placed to address the critical lab. ?- CBC with Differential/Platelet ?- Lipid Profile ?- CMP14+EGFR ?- Vitamin D (25 hydroxy) ?- TSH + free T4 ?- B12 and Folate Panel ?- Iron, TIBC and Ferritin Panel ?- Sed Rate (ESR) ?- C-reactive protein ?-  HepB+HepC+HIV Panel ?- DG Chest 2 View; Future ?- POCT Urinalysis Dipstick ? ?2. Generalized muscle weakness ?Rule out possible causes via labs including anemia, iron deficiency, vitamin deficiencies, thyroid problem

## 2021-10-04 ENCOUNTER — Telehealth: Payer: Self-pay

## 2021-10-04 ENCOUNTER — Inpatient Hospital Stay: Admission: AD | Admit: 2021-10-04 | Payer: Medicare Other | Source: Ambulatory Visit | Admitting: Internal Medicine

## 2021-10-04 LAB — CBC WITH DIFFERENTIAL/PLATELET
Basophils Absolute: 0 10*3/uL (ref 0.0–0.2)
Basos: 0 %
EOS (ABSOLUTE): 0 10*3/uL (ref 0.0–0.4)
Eos: 1 %
Hematocrit: 23.7 % — ABNORMAL LOW (ref 37.5–51.0)
Hemoglobin: 6.2 g/dL — CL (ref 13.0–17.7)
Immature Grans (Abs): 0 10*3/uL (ref 0.0–0.1)
Immature Granulocytes: 0 %
Lymphocytes Absolute: 0.9 10*3/uL (ref 0.7–3.1)
Lymphs: 14 %
MCH: 16.8 pg — ABNORMAL LOW (ref 26.6–33.0)
MCHC: 26.2 g/dL — ABNORMAL LOW (ref 31.5–35.7)
MCV: 64 fL — ABNORMAL LOW (ref 79–97)
Monocytes Absolute: 0.5 10*3/uL (ref 0.1–0.9)
Monocytes: 8 %
Neutrophils Absolute: 5 10*3/uL (ref 1.4–7.0)
Neutrophils: 77 %
Platelets: 277 10*3/uL (ref 150–450)
RBC: 3.68 x10E6/uL — ABNORMAL LOW (ref 4.14–5.80)
RDW: 19.2 % — ABNORMAL HIGH (ref 11.6–15.4)
WBC: 6.5 10*3/uL (ref 3.4–10.8)

## 2021-10-04 LAB — CMP14+EGFR
ALT: 8 IU/L (ref 0–44)
AST: 13 IU/L (ref 0–40)
Albumin/Globulin Ratio: 1.7 (ref 1.2–2.2)
Albumin: 3.8 g/dL (ref 3.8–4.8)
Alkaline Phosphatase: 72 IU/L (ref 44–121)
BUN/Creatinine Ratio: 22 (ref 10–24)
BUN: 17 mg/dL (ref 8–27)
Bilirubin Total: <0.2 mg/dL (ref 0.0–1.2)
CO2: 24 mmol/L (ref 20–29)
Calcium: 8.9 mg/dL (ref 8.6–10.2)
Chloride: 106 mmol/L (ref 96–106)
Creatinine, Ser: 0.79 mg/dL (ref 0.76–1.27)
Globulin, Total: 2.3 g/dL (ref 1.5–4.5)
Glucose: 108 mg/dL — ABNORMAL HIGH (ref 70–99)
Potassium: 4.5 mmol/L (ref 3.5–5.2)
Sodium: 140 mmol/L (ref 134–144)
Total Protein: 6.1 g/dL (ref 6.0–8.5)
eGFR: 99 mL/min/{1.73_m2} (ref >59)

## 2021-10-04 LAB — SEDIMENTATION RATE: Sed Rate: 54 mm/hr — ABNORMAL HIGH (ref 0–30)

## 2021-10-04 LAB — LIPID PANEL
Chol/HDL Ratio: 1.9 ratio (ref 0.0–5.0)
Cholesterol, Total: 120 mg/dL (ref 100–199)
HDL: 64 mg/dL (ref >39)
LDL Chol Calc (NIH): 43 mg/dL (ref 0–99)
Triglycerides: 61 mg/dL (ref 0–149)
VLDL Cholesterol Cal: 13 mg/dL (ref 5–40)

## 2021-10-04 LAB — IRON,TIBC AND FERRITIN PANEL
Ferritin: 6 ng/mL — ABNORMAL LOW (ref 30–400)
Iron Saturation: 3 % — CL (ref 15–55)
Iron: 12 ug/dL — ABNORMAL LOW (ref 38–169)
Total Iron Binding Capacity: 354 ug/dL (ref 250–450)
UIBC: 342 ug/dL (ref 111–343)

## 2021-10-04 LAB — HEPB+HEPC+HIV PANEL
HIV Screen 4th Generation wRfx: NONREACTIVE
Hep B C IgM: NEGATIVE
Hep B Core Total Ab: NEGATIVE
Hep B E Ab: NEGATIVE
Hep B E Ag: NEGATIVE
Hep B Surface Ab, Qual: NONREACTIVE
Hep C Virus Ab: NONREACTIVE
Hepatitis B Surface Ag: NEGATIVE

## 2021-10-04 LAB — B12 AND FOLATE PANEL
Folate: 7.9 ng/mL (ref >3.0)
Vitamin B-12: 423 pg/mL (ref 232–1245)

## 2021-10-04 LAB — VITAMIN D 25 HYDROXY (VIT D DEFICIENCY, FRACTURES): Vit D, 25-Hydroxy: 7.2 ng/mL — ABNORMAL LOW (ref 30.0–100.0)

## 2021-10-04 LAB — PSA TOTAL (REFLEX TO FREE): Prostate Specific Ag, Serum: 2.1 ng/mL (ref 0.0–4.0)

## 2021-10-04 LAB — TSH+FREE T4
Free T4: 1.37 ng/dL (ref 0.82–1.77)
TSH: 0.653 u[IU]/mL (ref 0.450–4.500)

## 2021-10-04 LAB — C-REACTIVE PROTEIN: CRP: <1 mg/L (ref 0–10)

## 2021-10-04 NOTE — Progress Notes (Signed)
Patient is severely anemic according to lab results and has severely low iron, iron saturation and ferritin level.  Labs discussed with Dr. Clayborn Bigness and it is decided that patient should be direct admitted to the hospital.  Patient and/or caregiver has been called by Edd Arbour, CMA, please see additional note regarding call intake. ?The attending physician at [facility] has been paged and we are currently awaiting a return call.  If we do not receive a return call in the next 30 minutes to an hour approximately, we will call the hospital line for direct admits again. ?Patient is symptomatic regarding the iron deficiency anemia.  He was seen yesterday for a new patient visit and was experiencing fatigue, balance and gait issues, generalized weakness and mild tachycardia. ?He does have other labs that are abnormal but they are not critical and will be addressed accordingly at a later date.

## 2021-10-04 NOTE — Telephone Encounter (Signed)
Called [facility] and had them add on a CEA (Carcinoembryonic Antigen) and also made sure that there was a PSA drawn and they advised it was done but waiting on results. ? ?Called and spoke to patients sister and informed her that his blood levels are low at 6.2 and he is very anemic which explains his weakness and balance issues.  Iron levels are severely low and due to these issues we feel that patient will most likely need to be admitted to the hospital for evaluation and possible have blood transfusion.  Informed sister that we are waiting on the hospitalitis to get back with [place] and we will let her know what they need to do. ?

## 2021-10-04 NOTE — Telephone Encounter (Signed)
Spoke to pt's sister Basilia Jumbo and informed her that we are waiting on the hospital to call us when a room is available.  Pt is being admitted but no rooms at the moment.  Told sister to keep phone close and either we will call or the hospital will call her when they have a room available. ?

## 2021-10-05 ENCOUNTER — Telehealth: Payer: Self-pay | Admitting: Emergency Medicine

## 2021-10-05 ENCOUNTER — Telehealth: Payer: Self-pay

## 2021-10-05 ENCOUNTER — Encounter: Payer: Self-pay | Admitting: Emergency Medicine

## 2021-10-05 ENCOUNTER — Other Ambulatory Visit: Payer: Self-pay

## 2021-10-05 ENCOUNTER — Inpatient Hospital Stay
Admission: EM | Admit: 2021-10-05 | Discharge: 2021-10-18 | DRG: 330 | Disposition: A | Discharge status: Home / Self Care | Payer: Medicare Other | Attending: Internal Medicine | Admitting: Internal Medicine

## 2021-10-05 ENCOUNTER — Observation Stay: Payer: Medicare Other

## 2021-10-05 DIAGNOSIS — K439 Ventral hernia without obstruction or gangrene: Secondary | ICD-10-CM | POA: Diagnosis present

## 2021-10-05 DIAGNOSIS — D500 Iron deficiency anemia secondary to blood loss (chronic): Secondary | ICD-10-CM | POA: Diagnosis present

## 2021-10-05 DIAGNOSIS — F250 Schizoaffective disorder, bipolar type: Secondary | ICD-10-CM | POA: Diagnosis not present

## 2021-10-05 DIAGNOSIS — D649 Anemia, unspecified: Secondary | ICD-10-CM | POA: Diagnosis not present

## 2021-10-05 DIAGNOSIS — K922 Gastrointestinal hemorrhage, unspecified: Secondary | ICD-10-CM | POA: Diagnosis not present

## 2021-10-05 DIAGNOSIS — H9193 Unspecified hearing loss, bilateral: Secondary | ICD-10-CM | POA: Diagnosis present

## 2021-10-05 DIAGNOSIS — K2950 Unspecified chronic gastritis without bleeding: Secondary | ICD-10-CM | POA: Diagnosis present

## 2021-10-05 DIAGNOSIS — K6389 Other specified diseases of intestine: Secondary | ICD-10-CM | POA: Diagnosis present

## 2021-10-05 DIAGNOSIS — K5730 Diverticulosis of large intestine without perforation or abscess without bleeding: Secondary | ICD-10-CM | POA: Diagnosis present

## 2021-10-05 DIAGNOSIS — R55 Syncope and collapse: Secondary | ICD-10-CM | POA: Diagnosis present

## 2021-10-05 DIAGNOSIS — B9681 Helicobacter pylori [H. pylori] as the cause of diseases classified elsewhere: Secondary | ICD-10-CM | POA: Diagnosis present

## 2021-10-05 DIAGNOSIS — G2401 Drug induced subacute dyskinesia: Secondary | ICD-10-CM | POA: Diagnosis present

## 2021-10-05 DIAGNOSIS — C189 Malignant neoplasm of colon, unspecified: Principal | ICD-10-CM | POA: Diagnosis present

## 2021-10-05 DIAGNOSIS — R634 Abnormal weight loss: Secondary | ICD-10-CM | POA: Diagnosis present

## 2021-10-05 DIAGNOSIS — F17200 Nicotine dependence, unspecified, uncomplicated: Secondary | ICD-10-CM | POA: Diagnosis present

## 2021-10-05 DIAGNOSIS — K648 Other hemorrhoids: Secondary | ICD-10-CM | POA: Diagnosis present

## 2021-10-05 DIAGNOSIS — D62 Acute posthemorrhagic anemia: Secondary | ICD-10-CM | POA: Diagnosis not present

## 2021-10-05 DIAGNOSIS — R1314 Dysphagia, pharyngoesophageal phase: Secondary | ICD-10-CM | POA: Diagnosis present

## 2021-10-05 DIAGNOSIS — R001 Bradycardia, unspecified: Secondary | ICD-10-CM | POA: Diagnosis present

## 2021-10-05 DIAGNOSIS — F17210 Nicotine dependence, cigarettes, uncomplicated: Secondary | ICD-10-CM | POA: Diagnosis present

## 2021-10-05 DIAGNOSIS — D509 Iron deficiency anemia, unspecified: Secondary | ICD-10-CM | POA: Diagnosis present

## 2021-10-05 DIAGNOSIS — W19XXXA Unspecified fall, initial encounter: Secondary | ICD-10-CM | POA: Diagnosis present

## 2021-10-05 DIAGNOSIS — K921 Melena: Secondary | ICD-10-CM | POA: Diagnosis present

## 2021-10-05 DIAGNOSIS — Z79899 Other long term (current) drug therapy: Secondary | ICD-10-CM

## 2021-10-05 DIAGNOSIS — F419 Anxiety disorder, unspecified: Secondary | ICD-10-CM | POA: Diagnosis present

## 2021-10-05 LAB — COMPREHENSIVE METABOLIC PANEL
ALT: 13 U/L (ref 0–44)
AST: 19 U/L (ref 15–41)
Albumin: 3.4 g/dL — ABNORMAL LOW (ref 3.5–5.0)
Alkaline Phosphatase: 66 U/L (ref 38–126)
Anion gap: 5 (ref 5–15)
BUN: 13 mg/dL (ref 8–23)
CO2: 25 mmol/L (ref 22–32)
Calcium: 8.8 mg/dL — ABNORMAL LOW (ref 8.9–10.3)
Chloride: 107 mmol/L (ref 98–111)
Creatinine, Ser: 0.77 mg/dL (ref 0.61–1.24)
GFR, Estimated: >60 mL/min (ref >60)
Glucose, Bld: 106 mg/dL — ABNORMAL HIGH (ref 70–99)
Potassium: 4.2 mmol/L (ref 3.5–5.1)
Sodium: 137 mmol/L (ref 135–145)
Total Bilirubin: 0.5 mg/dL (ref 0.3–1.2)
Total Protein: 6.6 g/dL (ref 6.5–8.1)

## 2021-10-05 LAB — APTT: aPTT: 34 seconds (ref 24–36)

## 2021-10-05 LAB — CBC
HCT: 24.8 % — ABNORMAL LOW (ref 39.0–52.0)
HCT: 25.6 % — ABNORMAL LOW (ref 39.0–52.0)
Hemoglobin: 6.3 g/dL — ABNORMAL LOW (ref 13.0–17.0)
Hemoglobin: 7.1 g/dL — ABNORMAL LOW (ref 13.0–17.0)
MCH: 16.5 pg — ABNORMAL LOW (ref 26.0–34.0)
MCH: 18.5 pg — ABNORMAL LOW (ref 26.0–34.0)
MCHC: 25.4 g/dL — ABNORMAL LOW (ref 30.0–36.0)
MCHC: 27.7 g/dL — ABNORMAL LOW (ref 30.0–36.0)
MCV: 65.1 fL — ABNORMAL LOW (ref 80.0–100.0)
MCV: 66.8 fL — ABNORMAL LOW (ref 80.0–100.0)
Platelets: 316 10*3/uL (ref 150–400)
Platelets: 298 10*3/uL (ref 150–400)
RBC: 3.81 MIL/uL — ABNORMAL LOW (ref 4.22–5.81)
RBC: 3.83 MIL/uL — ABNORMAL LOW (ref 4.22–5.81)
RDW: 22.2 % — ABNORMAL HIGH (ref 11.5–15.5)
RDW: 24.3 % — ABNORMAL HIGH (ref 11.5–15.5)
WBC: 6.7 10*3/uL (ref 4.0–10.5)
WBC: 8.6 10*3/uL (ref 4.0–10.5)
nRBC: 0 % (ref 0.0–0.2)
nRBC: 0 % (ref 0.0–0.2)

## 2021-10-05 LAB — ABO/RH: ABO/RH(D): B POS

## 2021-10-05 LAB — PROTIME-INR
INR: 1.2 (ref 0.8–1.2)
Prothrombin Time: 14.7 seconds (ref 11.4–15.2)

## 2021-10-05 LAB — CEA: CEA: 6.7 ng/mL — ABNORMAL HIGH (ref 0.0–4.7)

## 2021-10-05 LAB — SPECIMEN STATUS REPORT

## 2021-10-05 LAB — PREPARE RBC (CROSSMATCH)

## 2021-10-05 MED ORDER — BISACODYL 5 MG PO TBEC
10.0000 mg | DELAYED_RELEASE_TABLET | Freq: Once | ORAL | Status: AC
  Administered 2021-10-05: 10 mg via ORAL
  Filled 2021-10-05: qty 2

## 2021-10-05 MED ORDER — LORAZEPAM 2 MG/ML IJ SOLN: 0.5000 mg | Freq: Two times a day (BID) | INTRAMUSCULAR | Status: DC | PRN

## 2021-10-05 MED ORDER — ONDANSETRON HCL 4 MG/2ML IJ SOLN: 4.0000 mg | Freq: Three times a day (TID) | INTRAMUSCULAR | Status: DC | PRN

## 2021-10-05 MED ORDER — SODIUM CHLORIDE 0.9 % IV SOLN: INTRAVENOUS | Status: DC

## 2021-10-05 MED ORDER — ACETAMINOPHEN 325 MG PO TABS
650.0000 mg | ORAL_TABLET | Freq: Four times a day (QID) | ORAL | Status: DC | PRN
  Administered 2021-10-07: 650 mg via ORAL
  Filled 2021-10-05: qty 2

## 2021-10-05 MED ORDER — PANTOPRAZOLE SODIUM 40 MG PO TBEC
40.0000 mg | DELAYED_RELEASE_TABLET | Freq: Two times a day (BID) | ORAL | Status: DC
  Administered 2021-10-05 – 2021-10-18 (×24): 40 mg via ORAL
  Filled 2021-10-05 (×25): qty 1

## 2021-10-05 MED ORDER — PEG 3350-KCL-NA BICARB-NACL 420 G PO SOLR
4000.0000 mL | Freq: Once | ORAL | Status: AC
  Administered 2021-10-05: 4000 mL via ORAL
  Filled 2021-10-05: qty 4000

## 2021-10-05 MED ORDER — SENNA 8.6 MG PO TABS
1.0000 | ORAL_TABLET | Freq: Every day | ORAL | Status: DC | PRN
  Administered 2021-10-16: 8.6 mg via ORAL
  Filled 2021-10-05: qty 1

## 2021-10-05 MED ORDER — SODIUM CHLORIDE 0.9 % IV BOLUS
1000.0000 mL | Freq: Once | INTRAVENOUS | Status: AC
  Administered 2021-10-05: 1000 mL via INTRAVENOUS

## 2021-10-05 MED ORDER — SODIUM CHLORIDE 0.9 % IV SOLN
510.0000 mg | Freq: Once | INTRAVENOUS | Status: AC
  Administered 2021-10-05: 510 mg via INTRAVENOUS
  Filled 2021-10-05: qty 17

## 2021-10-05 MED ORDER — NICOTINE 21 MG/24HR TD PT24
21.0000 mg | MEDICATED_PATCH | Freq: Every day | TRANSDERMAL | Status: DC
  Administered 2021-10-07 – 2021-10-18 (×11): 21 mg via TRANSDERMAL
  Filled 2021-10-05 (×12): qty 1

## 2021-10-05 MED ORDER — FERROUS SULFATE 325 (65 FE) MG PO TABS
325.0000 mg | ORAL_TABLET | Freq: Two times a day (BID) | ORAL | Status: DC
  Administered 2021-10-06 – 2021-10-18 (×22): 325 mg via ORAL
  Filled 2021-10-05 (×24): qty 1

## 2021-10-05 MED ORDER — SODIUM CHLORIDE 0.9% IV SOLUTION
Freq: Once | INTRAVENOUS | Status: AC
  Filled 2021-10-05: qty 250

## 2021-10-05 NOTE — Telephone Encounter (Signed)
Pt's sister called and informed us that the hospital had called her last night around 10 pm but she was in bed.  She advised she called the hospital back this morning and they still had the bed available for patient.  She informed me that they were going to go to the bank and then attempt to take patient to the hospital for his admission.  I advised pt's sister to let us know if there is any problems.  We called Dr Humphrey Rolls and informed her of sister going to take pt to hospital for the admission ?

## 2021-10-05 NOTE — Telephone Encounter (Deleted)
Post ED Visit - Positive Culture Follow-up ? ?Culture report reviewed by antimicrobial stewardship pharmacist: ?[facility] ?'[x]'$  Elenor Quinones, Pharm.D. ?'[]'$  Heide Guile, Pharm.D., BCPS AQ-ID ?'[]'$  Parks Neptune, Pharm.D., BCPS ?'[]'$  Alycia Rossetti, Pharm.D., BCPS ?'[]'$  [place], Pharm.D., BCPS, AAHIVP ?'[]'$  Legrand Como, Pharm.D., BCPS, AAHIVP ?'[]'$  Salome Arnt, PharmD, BCPS ?'[]'$  Johnnette Gourd, PharmD, BCPS ?'[]'$  Hughes Better, PharmD, BCPS ?'[]'$  Leeroy Cha, PharmD ?'[]'$  Laqueta Linden, PharmD, BCPS ?'[]'$  Albertina Parr, PharmD ? ?[place] Team ?'[]'$  Leodis Sias, PharmD ?'[]'$  Lindell Spar, PharmD ?'[]'$  Royetta Asal, PharmD ?'[]'$  Graylin Shiver, Rph ?'[]'$  Rema Fendt) Glennon Mac, PharmD ?'[]'$  Arlyn Dunning, PharmD ?'[]'$  Netta Cedars, PharmD ?'[]'$  Dia Sitter, PharmD ?'[]'$  Leone Haven, PharmD ?'[]'$  Gretta Arab, PharmD ?'[]'$  Theodis Shove, PharmD ?'[]'$  Peggyann Juba, PharmD ?'[]'$  Reuel Boom, PharmD ? ? ?Positive urine culture ?Treated with cefpodoxime , organism sensitive to the same and no further patient follow-up is required at this time. ? ?Dustin [name] ?10/05/2021, 10:42 AM ?  ?

## 2021-10-05 NOTE — Assessment & Plan Note (Addendum)
Patient cannot provide detailed information.  CT head negative.  No focal neuro deficit on physical examination.  May be due to volume depletion/anemia secondary to GI bleeding ?-No further syncope or dizzy spells while in the hospital ? ?

## 2021-10-05 NOTE — ED Triage Notes (Signed)
Ems from home.  Went to his doc on Monday and they told him he needed transfusion but hospital did not have a bed.  So he went home to wait.  They got a bed and called him, but they gave it away before he got here. Vss.  Has history of psych issues, and he would not get in car with sister.  Ems arrived and he came with them.   ?

## 2021-10-05 NOTE — Assessment & Plan Note (Addendum)
Patient has iron deficiency. ?-start ferrous sulfate ?-As needed senna code ?-Give 1 dose of IV iron Feraheme today ?

## 2021-10-05 NOTE — Assessment & Plan Note (Addendum)
Status post 2 PRBC transfusion thus far cause is colonic mass and slow bleed over time.  Also received iron.  Hemoglobin stable at 8.6.  Surgeon would like to keep it above 8.5 before surgery ? ?

## 2021-10-05 NOTE — ED Triage Notes (Signed)
See first nurse note. Pt here for a blood transfusion. Denies any pain. Pt is A&OX4 and NAD.  ?

## 2021-10-05 NOTE — Telephone Encounter (Signed)
Call placed @ 0845 to Dr Wanda Plump. Dustin [name] who requested this patient to be a direct admit on 10/04/2021 for symptomatic anemia.  Dr Dustin [name] was informed that patient was called with an available room by the patient placement associate and refused to come.  They spoke with the patients sister who stated that the patient "does not like hospitals".  Patient placement coordinator explained that patients doctor had requested admit. Sister states she will continue to try to get patient to come.  Dr Dustin [name] was notified that patient refused to come and she stated that she would follow up with the patient and family when she got to the office ?

## 2021-10-05 NOTE — Assessment & Plan Note (Addendum)
Pt is getting Invega and Holdal injection ?-prn ativan 0.5 mg bid IV ?Patient appeared to have more psychotic behavior on 5/5 although more appropriate since then.  Psychiatry following ?

## 2021-10-05 NOTE — ED Provider Notes (Signed)
? ?[facility] ?Provider Note ? ? ? Event Date/Time  ? First MD Initiated Contact with Patient 10/05/21 1312   ?  (approximate) ? ? ?History  ?Anemia ? ? ?HPI ? ?Dustin [name] is a 66 y.o. male who comes in for concern for low blood levels.  Patient needed a blood transfusion and was originally planned to be admitted directly but lost his bed due to not wanting to get in the car with his sister.  It looks like patient was seen by Dr. Humphrey Rolls and recommended the admission for symptomatic anemia.  Patient is reporting having some bright red blood per rectum unclear exactly how long its been going on for.  Denies any with the last bowel movement.  Patient does report a syncopal episode but denies hitting his head.  Does state that was a few days ago.  He denies any back pain, abdominal pain or headaches.  Patient's brother is at bedside who reports that he is acting his normal self.  Patient reports being compliant with his schizophrenia medications and further denies any concerns for self-harm or SI ? ?I reviewed the blood work-up patient's hemoglobin is 6.2 with MCV of 64.  His ferritin and iron and iron saturation were also low. ? ? ?Physical Exam  ? ?Triage Vital Signs: ?ED Triage Vitals [10/05/21 1241]  ?Enc Vitals Group  ?   BP 110/81  ?   Pulse Rate 66  ?   Resp 16  ?   Temp 98.1 ?F (36.7 ?C)  ?   Temp Source Oral  ?   SpO2 100 %  ?   Weight   ?   Height   ?   Head Circumference   ?   Peak Flow   ?   Pain Score 0  ?   Pain Loc   ?   Pain Edu?   ?   Excl. in [name]?   ? ? ?Most recent vital signs: ?Vitals:  ? 10/05/21 1241  ?BP: 110/81  ?Pulse: 66  ?Resp: 16  ?Temp: 98.1 ?F (36.7 ?C)  ?SpO2: 100%  ? ? ? ?General: Awake, no distress.  ?CV:  Good peripheral perfusion.  ?Resp:  Normal effort.  ?Abd:  No distention.  ?Other:  Patient has no trauma noted to the head.  His abdomen is soft and nontender.  Rectal exam performed with brown stool Hemoccult negative.  Patient is alert and oriented x3  although slightly odd affect secondary to known schizophrenia. ? ? ?ED Results / Procedures / Treatments  ? ?Labs ?(all labs ordered are listed, but only abnormal results are displayed) ?Labs Reviewed - No data to display ? ? ?EKG ? ?My interpretation of EKG: ? ? ?Appears to be sinus with a rate of 52 no ST elevation or T wave inversions.  There is a lot of artifact but the tech said that she tried multiple times and the elbow was being read as A-fib but this is more from the artifact I do see P waves. ? ?PROCEDURES: ? ?Critical Care performed: Yes, see critical care procedure note(s) ? ?.Critical Care ?Performed by: Vanessa [name], MD ?Authorized by: Vanessa  AFB, MD  ? ?Critical care provider statement:  ?  Critical care time (minutes):  30 ?  Critical care was necessary to treat or prevent imminent or life-threatening deterioration of the following conditions: anemia. ?  Critical care was time spent personally by me on the following activities:  Development of treatment plan  with patient or surrogate, discussions with consultants, evaluation of patient's response to treatment, examination of patient, ordering and review of laboratory studies, ordering and review of radiographic studies, ordering and performing treatments and interventions, pulse oximetry, re-evaluation of patient's condition and review of old charts ? ? ?MEDICATIONS ORDERED IN ED: ?Medications - No data to display ? ? ?IMPRESSION / MDM / ASSESSMENT AND PLAN / ED COURSE  ?I reviewed the triage vital signs and the nursing notes. ? ?Patient comes in with significant anemia without having any blood work for the past few years.  Possibly contributing to positive syncope episodes a few days ago.  No evidence of trauma on examination and patient reports acting his normal self without any headache so I doubt intracranial hemorrhage.  Does not think he hit his head.  His abdomen is soft and nontender.  He does report some intermittent rectal bleeding but  no evidence of bleeding at this time.  We will get EKG to evaluate for arrhythmia.  I reviewed all the lab work from [place] where patient was anemic with low iron levels but normal CMP.  We will repeat today and I did consent both patient and the brother for blood transfusion and they are willing to proceed ? ? ?FINAL CLINICAL IMPRESSION(S) / ED DIAGNOSES  ? ?Final diagnoses:  ?Symptomatic anemia  ? ? ? ?Rx / DC Orders  ? ?ED Discharge Orders   ? ? None  ? ?  ? ? ? ?Note:  This document was prepared using Dragon voice recognition software and may include unintentional dictation errors. ?  ?Vanessa Broadwell, MD ?10/05/21 1439 ? ?

## 2021-10-05 NOTE — H&P (Addendum)
He is ?History and Physical  ? ? Dustin [name] EUM:[id number] DOB: August 19, 1955 DOA: 10/05/2021 ? ?Referring MD/NP/PA:  ? ?PCP: Jonetta Osgood, NP  ? ?Patient coming from:  The patient is coming from home.  At baseline, pt is independent for most of ADL.       ? ?Chief Complaint:  Rectal bleeding, fatigue, dizziness ? ?HPI: Dustin [name] is a 66 y.o. male with medical history significant of tobacco abuse, schizophrenia, tardive dyskinesia, who presents with rectal bleeding, fatigue, dizziness.  ? ?Patient states that he noticed bright red blood when wiping recently, but cannot give detailed information.  He denies abdominal pain, nausea, vomiting.  He reports dizziness, generalized weakness and fatigue. Pt states that he had episode of syncope and fell in the bathroom recently, again he cannot provide detailed information.  Currently patient does not have unilateral numbness or tinglings extremities, no facial droop or slurred speech. Patient does not have chest pain, cough, shortness breath.  No fever or chills.  Denies symptoms of UTI. ? ?He established care at Dr. Etta Quill office two days ago for these issues as well as unintentional weight loss of 15-20 pounds over the last 6 months. Lab work at this time showed severe iron-deficiency anemia with hemoglobin 6.2, MCV 64, ferritin 6, iron sat 3%, and iron 12. He was advised to have direct admission to the hospital and a bed was accepted, but patient did not come in time and bed was given to another patient.  ? ?Data Reviewed and ED Course: pt was found to have hemoglobin 6.3 (14.5 on 07/11/2018), INR 1.2, PTT 34, negative FOBT per ED physician, GFR > ? ? ?EKG: I have personally reviewed.  Sinus rhythm, QTc 392, low voltage. ? ? ?Review of Systems:  ? ?General: no fevers, chills, has weight loss, has fatigue ?HEENT: no blurry vision, hearing changes or sore throat ?Respiratory: no dyspnea, coughing, wheezing ?CV: no chest pain, no palpitations ?GI: no  nausea, vomiting, abdominal pain, diarrhea, constipation. Has rectal bleeding ?GU: no dysuria, burning on urination, increased urinary frequency, hematuria  ?Ext: no leg edema ?Neuro: no unilateral weakness, numbness, or tingling, no vision change or hearing loss. Has fall and syncope ?Skin: no rash, no skin tear. ?MSK: No muscle spasm, no deformity, no limitation of range of movement in spin ?Heme: No easy bruising.  ?Travel history: No recent long distant travel. ? ? ?Allergy: No Known Allergies ? ?Past Medical History:  ?Diagnosis Date  ? Schizophrenia ([place])   ? ? ?History reviewed. No pertinent surgical history. ? ?Social History:  reports that he has been smoking cigarettes. He has a 7.50 pack-year smoking history. He has never used smokeless tobacco. He reports that he does not currently use alcohol. He reports that he does not use drugs. ? ?Family History:  ?Family History  ?Problem Relation Age of Onset  ? Lung cancer Father   ? Hypertension Brother   ? Hypertension Brother   ? Hypertension Brother   ? Prostate cancer Brother   ?  ? ?Prior to Admission medications   ?Medication Sig Start Date End Date Taking? Authorizing Provider  ?haloperidol decanoate (HALDOL DECANOATE) 100 MG/ML injection Inject 1 mL (100 mg total) into the muscle once for 1 dose. Next dose due 5/15 07/16/18 07/16/18  [name], [name], MD  ?[facility] SUSTENNA 234 MG/1.5ML injection Inject into the muscle. 09/26/21   [provider]  ? ? ?Physical Exam: ?Vitals:  ? 10/05/21 2103 10/05/21 2104 10/05/21 2157 10/06/21  0357  ?BP:  128/81 137/78 91/65  ?Pulse:  (!) 58 (!) 54 80  ?Resp: '16  16 18  '$ ?Temp: 98.1 ?F (36.7 ?C)  98.2 ?F (36.8 ?C) 97.7 ?F (36.5 ?C)  ?TempSrc: Oral  Oral   ?SpO2:  100% 100% 100%  ?Weight:      ?Height:      ? ?General: Not in acute distress.  Pale looking, dry mucous membrane ?HEENT: ?      Eyes: PERRL, EOMI, no scleral icterus. ?      ENT: No discharge from the ears and nose, no pharynx injection, no tonsillar  enlargement.  ?      Neck: No JVD, no bruit, no mass felt. ?Heme: No neck lymph node enlargement. ?Cardiac: S1/S2, RRR, No murmurs, No gallops or rubs. ?Respiratory: No rales, wheezing, rhonchi or rubs. ?GI: Soft, nondistended, nontender, no rebound pain, no organomegaly, BS present. ?GU: No hematuria ?Ext: No pitting leg edema bilaterally. 1+DP/PT pulse bilaterally. ?Musculoskeletal: No joint deformities, No joint redness or warmth, no limitation of ROM in spin. ?Skin: No rashes.  ?Neuro: Alert, oriented X3, cranial nerves II-XII grossly intact, moves all extremities normally.  ?Psych: Patient is not psychotic, no suicidal or hemocidal ideation. ? ?Labs on Admission: I have personally reviewed following labs and imaging studies ? ?CBC: ?Recent Labs  ?Lab 10/03/21 ?1127 10/05/21 ?1410 10/05/21 ?2152 10/06/21 ?0158  ?WBC 6.5 6.7 8.6 8.1  ?NEUTROABS 5.0  --   --   --   ?HGB 6.2* 6.3* 7.1* 7.1*  ?HCT 23.7* 24.8* 25.6* 25.6*  ?MCV 64* 65.1* 66.8* 66.7*  ?PLT 277 316 298 306  ? ?Basic Metabolic Panel: ?Recent Labs  ?Lab 10/03/21 ?1127 10/05/21 ?1410  ?NA 140 137  ?K 4.5 4.2  ?CL 106 107  ?CO2 24 25  ?GLUCOSE 108* 106*  ?BUN 17 13  ?CREATININE 0.79 0.77  ?CALCIUM 8.9 8.8*  ? ?GFR: ?Estimated Creatinine Clearance: 79 mL/min (by C-G formula based on SCr of 0.77 mg/dL). ?Liver Function Tests: ?Recent Labs  ?Lab 10/03/21 ?1127 10/05/21 ?1410  ?AST 13 19  ?ALT 8 13  ?ALKPHOS 72 66  ?BILITOT <0.2 0.5  ?PROT 6.1 6.6  ?ALBUMIN 3.8 3.4*  ? ?No results for input(s): LIPASE, AMYLASE in the last 168 hours. ?No results for input(s): AMMONIA in the last 168 hours. ?Coagulation Profile: ?Recent Labs  ?Lab 10/05/21 ?1410  ?INR 1.2  ? ?Cardiac Enzymes: ?No results for input(s): CKTOTAL, CKMB, CKMBINDEX, TROPONINI in the last 168 hours. ?BNP (last 3 results) ?No results for input(s): PROBNP in the last 8760 hours. ?HbA1C: ?Recent Labs  ?  10/03/21 ?1041  ?HGBA1C 5.0  ? ?CBG: ?No results for input(s): GLUCAP in the last 168 hours. ?Lipid  Profile: ?Recent Labs  ?  10/03/21 ?1127  ?CHOL 120  ?HDL 64  ?[place] 43  ?TRIG 61  ?CHOLHDL 1.9  ? ?Thyroid Function Tests: ?Recent Labs  ?  10/03/21 ?1127  ?TSH 0.653  ?FREET4 1.37  ? ?Anemia Panel: ?Recent Labs  ?  10/03/21 ?1127  ?VITAMINB12 423  ?FOLATE 7.9  ?FERRITIN 6*  ?TIBC 354  ?IRON 12*  ? ?Urine analysis: ?   ?Component Value Date/Time  ? COLORURINE YELLOW (A) 09/06/2017 1404  ? APPEARANCEUR CLEAR (A) 09/06/2017 1404  ? APPEARANCEUR Hazy 05/14/2013 1123  ? LABSPEC 1.018 09/06/2017 1404  ? LABSPEC 1.025 05/14/2013 1123  ? PHURINE 5.0 09/06/2017 1404  ? GLUCOSEU NEGATIVE 09/06/2017 1404  ? GLUCOSEU Negative 05/14/2013 1123  ? [place] NEGATIVE 09/06/2017 1404  ?  BILIRUBINUR small 10/03/2021 1026  ? BILIRUBINUR Negative 05/14/2013 1123  ? KETONESUR 5 (A) 09/06/2017 1404  ? PROTEINUR Negative 10/03/2021 1026  ? PROTEINUR NEGATIVE 09/06/2017 1404  ? UROBILINOGEN negative (A) 10/03/2021 1026  ? NITRITE negative 10/03/2021 1026  ? NITRITE NEGATIVE 09/06/2017 1404  ? LEUKOCYTESUR Negative 10/03/2021 1026  ? LEUKOCYTESUR Negative 05/14/2013 1123  ? ?Sepsis Labs: ?'@LABRCNTIP'$ (procalcitonin:4,lacticidven:4) ?)No results found for this or any previous visit (from the past 240 hour(s)).  ? ?Radiological Exams on Admission: ?CT HEAD WO CONTRAST (5MM) ? ?Result Date: 10/05/2021 ?CLINICAL DATA:  Altered mental status EXAM: CT HEAD WITHOUT CONTRAST TECHNIQUE: Contiguous axial images were obtained from the base of the skull through the vertex without intravenous contrast. RADIATION DOSE REDUCTION: This exam was performed according to the departmental dose-optimization program which includes automated exposure control, adjustment of the mA and/or kV according to patient size and/or use of iterative reconstruction technique. COMPARISON:  None Available. FINDINGS: Brain: No acute territorial infarction, hemorrhage or intracranial mass. Mild atrophy. Nonenlarged ventricles. Vascular: No hyperdense vessels.  Carotid vascular  calcification. Skull: Normal. Negative for fracture or focal lesion. Small defects within the parietal bones posteriorly. Sinuses/Orbits: No acute finding. Other: None IMPRESSION: 1. No CT evidence for acute intracrania

## 2021-10-05 NOTE — Consult Note (Signed)
? ? ?GI Inpatient Consult Note ? ?Reason for Consult: Symptomatic anemia, severe iron-deficiency anemia ?  ?Attending Requesting Consult: Dr. Ivor Costa, MD ? ?History of Present Illness: ?Dustin [name] is a 66 y.o. male seen for evaluation of symptomatic anemia and severe iron-deficiency anemia at the request of admitting hospitalist - Dr. Ivor Costa. Patient has a PMH of Schizoaffective disorder, tobacco abuse, and tardive dyskinesia 2/2 long-term use of antipsychotic medications. He presented to the [facility] today for generalized weakness, balance issues, and report of anemia. He established care at Dr. Etta Quill office two days ago for these issues as well as unintentional weight loss of 15-20 pounds over the last 6 months. Lab work at this time showed severe iron-deficiency anemia with hemoglobin 6.2, MCV 64, ferritin 6, iron sat 3%, and iron 12. He was advised to have direct admission to the hospital and a bed was accepted, but patient did not come in time and bed was given to another patient. Patient presented to the [facility] ED this afternoon via EMS. His brother is also with patient and helps with history. Patient lives alone by himself. Brother reports over the last year he has noticed a substantial difference in patient's mental status, balance, energy levels, and weight. He thinks he has lost at least 20-lbs over the past 6 months. Patient himself reports he has seen some bright red blood intermixed in his stools and on tissue paper when wiping. He does not know how long this has been ongoing. He feels like blood occurs for the first day after he gets his decanoate injections for his Schizoaffective disorder. He denies any melanotic stool. He has not noticed any significant change in his bowel habits. He reports he is having 1-2 Bms daily and reports consistency is formed to semi-formed. He denies any complaints of abdominal pain or abdominal cramping. Appetite has been reduced and he is eating less. He  denies any UGI symptoms of nausea, vomiting, heartburn, acid reflux, early satiety, or epigastric abdominal pain. He does report over the last week he has noticed some solid food dysphagia to breads primarily localized right at his thyroid cartilage. He will have to massage it down his throat and it goes down fine after several dry swallows. He denies any regurgitation of food bolus. He denies any known family history of any GI malignancies. He denies any previous endoscopy or colonoscopy. He denies using NSAIDs frequently. He has been having a lot of balance issues and weakness. He reports several days ago he had a fall in the home but reports he did not lose consciousness and did not hit his head. He has noticed increased thirst. He denies any homicidal or suicidal ideations. He has been compliant with his medications and follows with [facility].  ? ? ?Past Medical History:  ?Past Medical History:  ?Diagnosis Date  ? Schizophrenia ([place])   ?  ?Problem List: ?Patient Active Problem List  ? Diagnosis Date Noted  ? GI bleeding 10/05/2021  ? Acute blood loss anemia 10/05/2021  ? Symptomatic anemia   ? Schizoaffective disorder, bipolar type ([place]) 01/24/2017  ? Tardive dyskinesia 01/24/2017  ? Tobacco use disorder 11/29/2016  ?  ?Past Surgical History: ?History reviewed. No pertinent surgical history.  ?Allergies: ?No Known Allergies  ?Home Medications: ?(Not in a hospital admission) ? ?Home medication reconciliation was completed with the patient.  ? ?Scheduled Inpatient Medications: ?  ? sodium chloride   Intravenous Once  ? nicotine  21 mg Transdermal Daily  ? ? ?  Continuous Inpatient Infusions: ?  ? sodium chloride 75 mL/hr at 10/05/21 1514  ? ? ?PRN Inpatient Medications:  ?acetaminophen, ondansetron (ZOFRAN) IV ? ?Family History: ?family history includes Hypertension in his brother, brother, and brother; Lung cancer in his father; Prostate cancer in his brother.  The patient's family history is negative for  inflammatory bowel disorders, GI malignancy, or solid organ transplantation. ? ?Social History:  ? reports that he has been smoking cigarettes. He has a 7.50 pack-year smoking history. He has never used smokeless tobacco. He reports that he does not currently use alcohol. He reports that he does not use drugs. The patient denies ETOH, tobacco, or drug use.  ? ?Review of Systems: ?Constitutional: ++ weight loss ?Eyes: No changes in vision. ?ENT: No oral lesions, sore throat.  ?GI: see HPI.  ?Heme/Lymph: No easy bruising.  ?CV: No chest pain.  ?GU: No hematuria.  ?Integumentary: No rashes.  ?Neuro: No headaches.  ?Psych: No depression/anxiety. +Confusion  ?Endocrine: No heat/cold intolerance.  ?Allergic/Immunologic: No urticaria.  ?Resp: No cough, SOB.  ?Musculoskeletal: No joint swelling.  ?  ?Physical Examination: ?BP 110/81 (BP Location: Left Arm)   Pulse 66   Temp 98.1 ?F (36.7 ?C) (Oral)   Resp 16   SpO2 100%  ?Gen: NAD, alert and oriented x 4, hard of hearing ?HEENT: PEERLA, EOMI, bilateral temporal wasting  ?Neck: supple, no JVD or thyromegaly ?Chest: CTA bilaterally, no wheezes, crackles, or other adventitious sounds ?CV: RRR, no m/g/c/r ?Abd: soft, NT, ND, +BS in all four quadrants; no HSM, guarding, ridigity, or rebound tenderness ?Ext: no edema, well perfused with 2+ pulses, ?Skin: no rash or lesions noted ?Lymph: no LAD ? ?Data: ?Lab Results  ?Component Value Date  ? WBC 6.5 10/03/2021  ? HGB 6.2 (LL) 10/03/2021  ? HCT 23.7 (L) 10/03/2021  ? MCV 64 (L) 10/03/2021  ? PLT 277 10/03/2021  ? ?Recent Labs  ?Lab 10/03/21 ?1127  ?HGB 6.2*  ? ?Lab Results  ?Component Value Date  ? NA 137 10/05/2021  ? K 4.2 10/05/2021  ? CL 107 10/05/2021  ? CO2 25 10/05/2021  ? BUN 13 10/05/2021  ? CREATININE 0.77 10/05/2021  ? ?Lab Results  ?Component Value Date  ? ALT 13 10/05/2021  ? AST 19 10/05/2021  ? ALKPHOS 66 10/05/2021  ? BILITOT 0.5 10/05/2021  ? ?Recent Labs  ?Lab 10/05/21 ?1410  ?APTT 34  ?INR 1.2   ? ?Assessment/Plan: ? ?66 y/o AA male with a PMH of Schizoaffective disorder, tobacco abuse, and tardive dyskinesia 2/2 long-term use of antipsychotic medications presented to the [facility] ED this afternoon via EMS for generalized weakness, balance issues, and low hemoglobin. He established care with Jonetta Osgood, NP at [facility] earlier this week and routine labs confirmed severe iron-deficiency anemia with hemoglobin 6.2, MCV 64, ferritin 6, iron sat 3%, and iron 12. GI consulted for further evaluation and management.  ? ?Severe iron-deficiency anemia/symptomatic anemia - hemoglobin 6.2, MCV 64 with severely reduced iron levels compared to >14 two years ago suggestive of GI bleed, likely chronic in nature. No overt gastrointestinal bleeding on exam. DDx includes colorectal neoplasm, advanced adenoma, other occult GI malignancy, peptic ulcer disease, gastritis, duodenitis, AVMs, GAVE, or possible severe nutritional deficiencies, hemolytic anemia, etc.  ? ?Hematochezia - no overt GIB on exam, clinical picture is concerning for underlying malignancy in setting of elevated CEA.  ? ?Pharyngoesophageal dysphagia x 1 week - solids only, DDx includes esophageal stricture,  Schatzki ring, esophageal or gastric neoplasm,  esophageal dysmotility, erosive esophagitis, etc ? ?Elevated CEA - 6.7 on recent blood work, raising concern for possible CCA ? ?Altered mental status/Confusion  ? ?Hx of syncopal episode this week - possibly 2/2 symptomatic anemia or possible TIA/CVA or vasovagal episode ? ?Unintentional weight loss - 15-20 lbs x 6 months ? ?Schizoaffective disorder - follows with [name] ? ?Recommendations: ? ?- Agree with 1 unit pRBCs transfusion ?- Continue to monitor H&H closely. Transfuse to ensure Hgb >7.0.  ?- No signs of overt gastrointestinal blood loss.  ?- CT head without contrast ordered for further evaluation of altered mental status and confusion to r/o acute abnormality  ?- Continue serial  examinations and mental checks  ?- Discussed recent labs confirming IDA with patient and brother along with differential diagnosis of IDA and recommendation to proceed with bidirectional endoscopy to rule out any acute or chronic GI blo

## 2021-10-05 NOTE — Assessment & Plan Note (Addendum)
Continue nicotine patch  °

## 2021-10-05 NOTE — Telephone Encounter (Signed)
Faxed signed order for lab add on's back to [facility] 10/04/21 @ 559 pm ?

## 2021-10-06 ENCOUNTER — Observation Stay: Payer: Medicare Other | Admitting: Anesthesiology

## 2021-10-06 ENCOUNTER — Encounter
Admission: EM | Disposition: A | Discharge status: Home / Self Care | Payer: Self-pay | Source: Home / Self Care | Attending: Internal Medicine

## 2021-10-06 ENCOUNTER — Encounter: Payer: Self-pay | Admitting: Internal Medicine

## 2021-10-06 DIAGNOSIS — R634 Abnormal weight loss: Secondary | ICD-10-CM | POA: Diagnosis present

## 2021-10-06 DIAGNOSIS — F17200 Nicotine dependence, unspecified, uncomplicated: Secondary | ICD-10-CM | POA: Diagnosis not present

## 2021-10-06 DIAGNOSIS — D509 Iron deficiency anemia, unspecified: Secondary | ICD-10-CM

## 2021-10-06 DIAGNOSIS — G2401 Drug induced subacute dyskinesia: Secondary | ICD-10-CM | POA: Diagnosis present

## 2021-10-06 DIAGNOSIS — F250 Schizoaffective disorder, bipolar type: Secondary | ICD-10-CM | POA: Diagnosis present

## 2021-10-06 DIAGNOSIS — C182 Malignant neoplasm of ascending colon: Secondary | ICD-10-CM | POA: Diagnosis not present

## 2021-10-06 DIAGNOSIS — B9681 Helicobacter pylori [H. pylori] as the cause of diseases classified elsewhere: Secondary | ICD-10-CM | POA: Diagnosis present

## 2021-10-06 DIAGNOSIS — K922 Gastrointestinal hemorrhage, unspecified: Secondary | ICD-10-CM | POA: Diagnosis present

## 2021-10-06 DIAGNOSIS — H9193 Unspecified hearing loss, bilateral: Secondary | ICD-10-CM | POA: Diagnosis present

## 2021-10-06 DIAGNOSIS — C189 Malignant neoplasm of colon, unspecified: Secondary | ICD-10-CM | POA: Diagnosis present

## 2021-10-06 DIAGNOSIS — R001 Bradycardia, unspecified: Secondary | ICD-10-CM | POA: Diagnosis present

## 2021-10-06 DIAGNOSIS — Z79899 Other long term (current) drug therapy: Secondary | ICD-10-CM | POA: Diagnosis not present

## 2021-10-06 DIAGNOSIS — D649 Anemia, unspecified: Secondary | ICD-10-CM | POA: Diagnosis not present

## 2021-10-06 DIAGNOSIS — K6389 Other specified diseases of intestine: Secondary | ICD-10-CM | POA: Diagnosis present

## 2021-10-06 DIAGNOSIS — D62 Acute posthemorrhagic anemia: Secondary | ICD-10-CM | POA: Diagnosis present

## 2021-10-06 DIAGNOSIS — K648 Other hemorrhoids: Secondary | ICD-10-CM | POA: Diagnosis present

## 2021-10-06 DIAGNOSIS — F419 Anxiety disorder, unspecified: Secondary | ICD-10-CM | POA: Diagnosis present

## 2021-10-06 DIAGNOSIS — F17210 Nicotine dependence, cigarettes, uncomplicated: Secondary | ICD-10-CM | POA: Diagnosis present

## 2021-10-06 DIAGNOSIS — W19XXXA Unspecified fall, initial encounter: Secondary | ICD-10-CM | POA: Diagnosis present

## 2021-10-06 DIAGNOSIS — K921 Melena: Secondary | ICD-10-CM | POA: Diagnosis present

## 2021-10-06 DIAGNOSIS — K439 Ventral hernia without obstruction or gangrene: Secondary | ICD-10-CM | POA: Diagnosis present

## 2021-10-06 DIAGNOSIS — K2950 Unspecified chronic gastritis without bleeding: Secondary | ICD-10-CM | POA: Diagnosis present

## 2021-10-06 DIAGNOSIS — R55 Syncope and collapse: Secondary | ICD-10-CM | POA: Diagnosis present

## 2021-10-06 DIAGNOSIS — K5730 Diverticulosis of large intestine without perforation or abscess without bleeding: Secondary | ICD-10-CM | POA: Diagnosis present

## 2021-10-06 DIAGNOSIS — D500 Iron deficiency anemia secondary to blood loss (chronic): Secondary | ICD-10-CM | POA: Diagnosis not present

## 2021-10-06 DIAGNOSIS — R1314 Dysphagia, pharyngoesophageal phase: Secondary | ICD-10-CM | POA: Diagnosis present

## 2021-10-06 HISTORY — PX: COLONOSCOPY: SHX5424

## 2021-10-06 HISTORY — PX: ESOPHAGOGASTRODUODENOSCOPY: SHX5428

## 2021-10-06 LAB — CBC
HCT: 25.6 % — ABNORMAL LOW (ref 39.0–52.0)
HCT: 25.6 % — ABNORMAL LOW (ref 39.0–52.0)
HCT: 25.2 % — ABNORMAL LOW (ref 39.0–52.0)
Hemoglobin: 7.1 g/dL — ABNORMAL LOW (ref 13.0–17.0)
Hemoglobin: 7 g/dL — ABNORMAL LOW (ref 13.0–17.0)
Hemoglobin: 6.9 g/dL — ABNORMAL LOW (ref 13.0–17.0)
MCH: 18.5 pg — ABNORMAL LOW (ref 26.0–34.0)
MCH: 18.3 pg — ABNORMAL LOW (ref 26.0–34.0)
MCH: 18.4 pg — ABNORMAL LOW (ref 26.0–34.0)
MCHC: 27.7 g/dL — ABNORMAL LOW (ref 30.0–36.0)
MCHC: 27.3 g/dL — ABNORMAL LOW (ref 30.0–36.0)
MCHC: 27.4 g/dL — ABNORMAL LOW (ref 30.0–36.0)
MCV: 66.7 fL — ABNORMAL LOW (ref 80.0–100.0)
MCV: 66.8 fL — ABNORMAL LOW (ref 80.0–100.0)
MCV: 67 fL — ABNORMAL LOW (ref 80.0–100.0)
Platelets: 306 10*3/uL (ref 150–400)
Platelets: 303 10*3/uL (ref 150–400)
Platelets: 302 10*3/uL (ref 150–400)
RBC: 3.84 MIL/uL — ABNORMAL LOW (ref 4.22–5.81)
RBC: 3.83 MIL/uL — ABNORMAL LOW (ref 4.22–5.81)
RBC: 3.76 MIL/uL — ABNORMAL LOW (ref 4.22–5.81)
RDW: 24.1 % — ABNORMAL HIGH (ref 11.5–15.5)
RDW: 24 % — ABNORMAL HIGH (ref 11.5–15.5)
RDW: 24.1 % — ABNORMAL HIGH (ref 11.5–15.5)
WBC: 8.1 10*3/uL (ref 4.0–10.5)
WBC: 6.5 10*3/uL (ref 4.0–10.5)
WBC: 5.6 10*3/uL (ref 4.0–10.5)
nRBC: 0 % (ref 0.0–0.2)
nRBC: 0 % (ref 0.0–0.2)
nRBC: 0 % (ref 0.0–0.2)

## 2021-10-06 LAB — HEMOGLOBIN AND HEMATOCRIT, BLOOD
HCT: 27.6 % — ABNORMAL LOW (ref 39.0–52.0)
Hemoglobin: 7.7 g/dL — ABNORMAL LOW (ref 13.0–17.0)

## 2021-10-06 LAB — PREPARE RBC (CROSSMATCH)

## 2021-10-06 LAB — GLUCOSE, CAPILLARY: Glucose-Capillary: 87 mg/dL (ref 70–99)

## 2021-10-06 LAB — HIV ANTIBODY (ROUTINE TESTING W REFLEX): HIV Screen 4th Generation wRfx: NONREACTIVE

## 2021-10-06 SURGERY — EGD (ESOPHAGOGASTRODUODENOSCOPY)
Anesthesia: General

## 2021-10-06 MED ORDER — SODIUM CHLORIDE 0.9% IV SOLUTION
Freq: Once | INTRAVENOUS | Status: AC
  Administered 2021-10-06: 10 mL via INTRAVENOUS

## 2021-10-06 MED ORDER — SPOT INK MARKER SYRINGE KIT
PACK | SUBMUCOSAL | Status: DC | PRN
  Administered 2021-10-06: 4 mL via SUBMUCOSAL

## 2021-10-06 MED ORDER — SODIUM CHLORIDE 0.9 % IV SOLN: INTRAVENOUS | Status: DC

## 2021-10-06 MED ORDER — PROPOFOL 10 MG/ML IV BOLUS
INTRAVENOUS | Status: DC | PRN
  Administered 2021-10-06: 50 mg via INTRAVENOUS

## 2021-10-06 MED ORDER — PROPOFOL 500 MG/50ML IV EMUL
INTRAVENOUS | Status: AC
  Filled 2021-10-06: qty 50

## 2021-10-06 MED ORDER — PROPOFOL 500 MG/50ML IV EMUL
INTRAVENOUS | Status: DC | PRN
  Administered 2021-10-06: 170 ug/kg/min via INTRAVENOUS

## 2021-10-06 MED ORDER — GLYCOPYRROLATE 0.2 MG/ML IJ SOLN
INTRAMUSCULAR | Status: DC | PRN
  Administered 2021-10-06: .2 mg via INTRAVENOUS

## 2021-10-06 NOTE — Op Note (Signed)
[facility] ?Gastroenterology ?Patient Name: Dustin [name] ?Procedure Date: 10/06/2021 12:33 PM ?MRN: [id number] ?Account #: [id number] ?Date of Birth: [birth year] ?Admit Type: Inpatient ?Age: 66 ?Room: [facility] ENDO ROOM 4 ?Gender: Male ?Note Status: Finalized ?Instrument Name: Colonoscope [id number] ?Procedure:             Colonoscopy ?Indications:           Iron deficiency anemia secondary to chronic blood  ?                       loss, Change in bowel habits, Weight loss, Elevated  ?                       CEA level ?Providers:             Benay Pike. Alice Reichert MD, MD ?Referring MD:          Jonetta Osgood (Referring MD) ?Medicines:             Propofol per Anesthesia ?Complications:         No immediate complications. ?Procedure:             Pre-Anesthesia Assessment: ?                       - The risks and benefits of the procedure and the  ?                       sedation options and risks were discussed with the  ?                       patient. All questions were answered and informed  ?                       consent was obtained. ?                       - Patient identification and proposed procedure were  ?                       verified prior to the procedure by the nurse. The  ?                       procedure was verified in the procedure room. ?                       - ASA Grade Assessment: III - A patient with severe  ?                       systemic disease. ?                       - After reviewing the risks and benefits, the patient  ?                       was deemed in satisfactory condition to undergo the  ?                       procedure. ?                       After obtaining informed consent,  the colonoscope was  ?                       passed under direct vision. Throughout the procedure,  ?                       the patient's blood pressure, pulse, and oxygen  ?                       saturations were monitored continuously. The  ?                       Colonoscope was introduced  through the anus and  ?                       advanced to the the cecum, identified by appendiceal  ?                       orifice and ileocecal valve. The colonoscopy was  ?                       technically difficult and complex due to poor bowel  ?                       prep with stool present. The patient tolerated the  ?                       procedure well. The quality of the bowel preparation  ?                       was poor. The ileocecal valve, appendiceal orifice,  ?                       and rectum were photographed. ?Findings: ?     The perianal exam findings include internal hemorrhoids that do not  ?     return to the anal canal, thus continuously prolapsed (Grade IV). ?     The perianal and digital rectal examinations were normal. Pertinent  ?     negatives include normal sphincter tone. ?     Copious quantities of semi-solid stool was found in the entire colon,  ?     interfering with visualization. Lavage of the area was performed using a  ?     moderate amount of tap water, resulting in incomplete clearance with  ?     continued poor visualization. ?     A fungating, infiltrative and polypoid partially obstructing  ?     medium-sized mass was found in the proximal ascending colon. The mass  ?     was non-circumferential. The mass measured three cm in length. In  ?     addition, its diameter measured twenty-one mm. No bleeding was present.  ?     This was biopsied with a cold forceps for histology. Area was  ?     successfully injected with 4 mL Spot (carbon black) for tattooing. ?     Many medium-mouthed diverticula were found in the sigmoid colon. ?     The exam was otherwise without abnormality. ?Impression:            - Preparation of the colon was poor. ?                       -  Internal hemorrhoids that do not return to the anal  ?                       canal, thus continuously prolapsed (Grade IV) found on  ?                       perianal exam. ?                       - Stool in the entire  examined colon. ?                       - Likely malignant partially obstructing tumor in the  ?                       proximal ascending colon. Biopsied. Injected. ?                       - Diverticulosis in the sigmoid colon. ?                       - The examination was otherwise normal. ?Recommendation:        - Return patient to hospital ward for ongoing care. ?                       - Await pathology results. ?                       - Clear liquid diet. ?                       - Continue present medications. ?Procedure Code(s):     --- Professional --- ?                       [phone], Colonoscopy, flexible; with directed submucosal  ?                       injection(s), any substance ?                       27035, Colonoscopy, flexible; with biopsy, single or  ?                       multiple ?Diagnosis Code(s):     --- Professional --- ?                       K57.30, Diverticulosis of large intestine without  ?                       perforation or abscess without bleeding ?                       R63.4, Abnormal weight loss ?                       R19.4, Change in bowel habit ?                       D50.0, Iron deficiency anemia secondary to blood loss  ?                       (  chronic) ?                       K56.690, Other partial intestinal obstruction ?                       D49.0, Neoplasm of unspecified behavior of digestive  ?                       system ?                       K64.3, Fourth degree hemorrhoids ?CPT copyright 2019 American Medical Association. All rights reserved. ?The codes documented in this report are preliminary and upon coder review may  ?be revised to meet current compliance requirements. ?Efrain Sella MD, MD ?10/06/2021 1:23:15 PM ?This report has been signed electronically. ?Number of Addenda: 0 ?Note Initiated On: 10/06/2021 12:33 PM ?Scope Withdrawal Time: 0 hours 7 minutes 0 seconds  ?Total Procedure Duration: 0 hours 15 minutes 32 seconds  ?Estimated Blood Loss:  Estimated  blood loss: none. Estimated blood loss: none. ?     [facility] ?

## 2021-10-06 NOTE — Plan of Care (Signed)
?  Problem: Education: ?Goal: Knowledge of General Education information will improve ?Description: Including pain rating scale, medication(s)/side effects and non-pharmacologic comfort measures ?Outcome: Progressing ?  ?Problem: Health Behavior/Discharge Planning: ?Goal: Ability to manage health-related needs will improve ?Outcome: Progressing ?  ?Problem: Clinical Measurements: ?Goal: Ability to maintain clinical measurements within normal limits will improve ?Outcome: Progressing ?Goal: Will remain free from infection ?Outcome: Progressing ?Goal: Diagnostic test results will improve ?Outcome: Progressing ?Goal: Respiratory complications will improve ?Outcome: Progressing ?Goal: Cardiovascular complication will be avoided ?Outcome: Progressing ?  ?Problem: Activity: ?Goal: Risk for activity intolerance will decrease ?Outcome: Progressing ?  ?Problem: Nutrition: ?Goal: Adequate nutrition will be maintained ?Outcome: Progressing ?  ?Problem: Coping: ?Goal: Level of anxiety will decrease ?Outcome: Progressing ?  ?Problem: Elimination: ?Goal: Will not experience complications related to bowel motility ?Outcome: Progressing ?Goal: Will not experience complications related to urinary retention ?Outcome: Progressing ?  ?Problem: Pain Managment: ?Goal: General experience of comfort will improve ?Outcome: Progressing ?  ?Problem: Safety: ?Goal: Ability to remain free from injury will improve ?Outcome: Progressing ?  ?Problem: Skin Integrity: ?Goal: Risk for impaired skin integrity will decrease ?Outcome: Progressing ?  ?Problem: Fluid Volume: ?Goal: Will show no signs and symptoms of excessive bleeding ?Outcome: Progressing ?  ?Problem: Education: ?Goal: Ability to identify signs and symptoms of gastrointestinal bleeding will improve ?Outcome: Progressing ?  ?Problem: Clinical Measurements: ?Goal: Complications related to the disease process, condition or treatment will be avoided or minimized ?Outcome: Progressing ?  ?

## 2021-10-06 NOTE — Progress Notes (Signed)
Patient is currently on observation at [facility] for symptomatic iron deficiency anemia and rectal bleeding.  ?His PSA was wnl but his CEA was added on and is elevated at 6.7. will discuss with patient at follow up visit after he is discharged from the hospital and discuss next steps also depending on what tests and imaging are done while he is in the hospital. His chest xray was done earlier this week and was negative, may need to follow up with CT chest but will discuss this at a later date and time.

## 2021-10-06 NOTE — Assessment & Plan Note (Addendum)
CEA level only at 6.  Biopsy samples on the note tubulovillous adenoma and not for adenocarcinoma of colon.  Cannot fully rule out malignancy without removing mass concerning for pathology.  General surgery consulted and plans to take patient to the OR on Thursday, 5/11 ?

## 2021-10-06 NOTE — Op Note (Signed)
[facility] ?Gastroenterology ?Patient Name: Dustin [name] ?Procedure Date: 10/06/2021 12:34 PM ?MRN: [id number] ?Account #: [id number] ?Date of Birth: 1955/12/16 ?Admit Type: Outpatient ?Age: 66 ?Room: [facility] ENDO ROOM 4 ?Gender: Male ?Note Status: Finalized ?Instrument Name: Upper Endoscope [id number] ?Procedure:             Upper GI endoscopy ?Indications:           Iron deficiency anemia secondary to chronic blood loss ?Providers:             Benay Pike. Alice Reichert MD, MD ?Referring MD:          Jonetta Osgood (Referring MD) ?Medicines:             Propofol per Anesthesia ?Complications:         No immediate complications. ?Procedure:             Pre-Anesthesia Assessment: ?                       - The risks and benefits of the procedure and the  ?                       sedation options and risks were discussed with the  ?                       patient. All questions were answered and informed  ?                       consent was obtained. ?                       - Patient identification and proposed procedure were  ?                       verified prior to the procedure by the nurse. The  ?                       procedure was verified in the procedure room. ?                       - ASA Grade Assessment: III - A patient with severe  ?                       systemic disease. ?                       - After reviewing the risks and benefits, the patient  ?                       was deemed in satisfactory condition to undergo the  ?                       procedure. ?                       After obtaining informed consent, the endoscope was  ?                       passed under direct vision. Throughout the procedure,  ?  the patient's blood pressure, pulse, and oxygen  ?                       saturations were monitored continuously. The Endoscope  ?                       was introduced through the mouth, and advanced to the  ?                       third part of duodenum. The upper GI  endoscopy was  ?                       accomplished without difficulty. The patient tolerated  ?                       the procedure well. ?Findings: ?     The esophagus was normal. ?     Patchy nodular mucosa was found in the gastric body. Biopsies were taken  ?     with a cold forceps for histology. ?     The examined duodenum was normal. ?     The exam was otherwise without abnormality. ?Impression:            - Normal esophagus. ?                       - Nodular mucosa in the gastric body. Biopsied. ?                       - Normal examined duodenum. ?                       - The examination was otherwise normal. ?Recommendation:        - Await pathology results. ?                       - Proceed with colonoscopy ?Procedure Code(s):     --- Professional --- ?                       [phone], Esophagogastroduodenoscopy, flexible,  ?                       transoral; with biopsy, single or multiple ?Diagnosis Code(s):     --- Professional --- ?                       D50.0, Iron deficiency anemia secondary to blood loss  ?                       (chronic) ?                       K31.89, Other diseases of stomach and duodenum ?CPT copyright 2019 American Medical Association. All rights reserved. ?The codes documented in this report are preliminary and upon coder review may  ?be revised to meet current compliance requirements. ?Efrain Sella MD, MD ?10/06/2021 12:50:02 PM ?This report has been signed electronically. ?Number of Addenda: 0 ?Note Initiated On: 10/06/2021 12:34 PM ?Estimated Blood Loss:  Estimated blood loss: none. ?     [facility] ?

## 2021-10-06 NOTE — Anesthesia Postprocedure Evaluation (Signed)
Anesthesia Post Note ? ?Patient: Dustin [name] ? ?Procedure(s) Performed: ESOPHAGOGASTRODUODENOSCOPY (EGD) ?COLONOSCOPY ? ?Patient location during evaluation: PACU ?Anesthesia Type: General ?Level of consciousness: awake and alert, oriented and patient cooperative ?Pain management: pain level controlled ?Vital Signs Assessment: post-procedure vital signs reviewed and stable ?Respiratory status: spontaneous breathing, nonlabored ventilation and respiratory function stable ?Cardiovascular status: blood pressure returned to baseline and stable ?Postop Assessment: adequate PO intake ?Anesthetic complications: no ? ? ?No notable events documented. ? ? ?Last Vitals:  ?Vitals:  ? 10/06/21 1326 10/06/21 1336  ?BP: 98/72   ?Pulse: 72 60  ?Resp:    ?Temp:    ?SpO2: 100% 100%  ?  ?Last Pain:  ?Vitals:  ? 10/06/21 1336  ?TempSrc:   ?PainSc: 0-No pain  ? ? ?  ?  ?  ?  ?  ?  ? ?Darrin Nipper ? ? ? ? ?

## 2021-10-06 NOTE — Hospital Course (Addendum)
66 year old male with past medical history of tobacco abuse, schizophrenia and tardive dyskinesia who presented to the emergency room after being redirected there by his PCP.  2 days prior, blood work came back with a hemoglobin of 6.2 and an MCV of 64.  In addition, patient notes a 15 to 20 pound weight loss over the last 6 months.  Patient also relates a 1 week history of pharyngeal esophageal dysphagia.  Patient was transfused 1 unit packed red blood cells and brought in for further evaluation to the hospitalist service.  GI was consulted and took patient for EGD and colonoscopy on 5/4.  Endoscopy unremarkable, but colonoscopy revealed incidental diverticula and a partially obstructing colonic mass in the ascending colon.  Interestingly, biopsies note tubulovillous adenoma and not adenocarcinoma of the colon.  General surgery consulted and plans to take patient for removal of colon mass on Thursday, 5/11.  Psychiatry consulted for psychosis behavior. ? ?5/9: wants to eat, surgery on 5/11 ?5/10: Psych reeval for schizophrenia has patient is hallucinating and worried about surgery ?5/11: Right colectomy for colon mass ?5/12: POD 1 status post right colectomy for ascending colon mass.  Full liquid diet started, discontinue Foley catheter ?5/13: Advance diet, encourage mobility ?5/14: No bowel movement documented yet, passing gas ?5/15: Difficulty getting hold of his family.  Brother holds key for his house so unable to discharge safely ?

## 2021-10-06 NOTE — Anesthesia Procedure Notes (Signed)
Date/Time: 10/06/2021 12:51 PM ?Performed by: Nelda Marseille, CRNA ?Pre-anesthesia Checklist: Patient identified, Emergency Drugs available, Suction available, Patient being monitored and Timeout performed ?Oxygen Delivery Method: Nasal cannula ? ? ? ? ?

## 2021-10-06 NOTE — Transfer of Care (Signed)
Immediate Anesthesia Transfer of Care Note ? ?Patient: Dustin [name] ? ?Procedure(s) Performed: ESOPHAGOGASTRODUODENOSCOPY (EGD) ?COLONOSCOPY ? ?Patient Location: PACU ? ?Anesthesia Type:General ? ?Level of Consciousness: awake, alert  and oriented ? ?Airway & Oxygen Therapy: Patient Spontanous Breathing and Patient connected to nasal cannula oxygen ? ?Post-op Assessment: Report given to RN and Post -op Vital signs reviewed and stable ? ?Post vital signs: Reviewed and stable ? ?Last Vitals:  ?Vitals Value Taken Time  ?BP    ?Temp    ?Pulse 76 10/06/21 1316  ?Resp 17 10/06/21 1316  ?SpO2 100 % 10/06/21 1316  ?Vitals shown include unvalidated device data. ? ?Last Pain:  ?Vitals:  ? 10/06/21 1155  ?TempSrc: Temporal  ?PainSc: 0-No pain  ?   ? ?  ? ?Complications: No notable events documented. ?

## 2021-10-06 NOTE — Progress Notes (Signed)
[facility] Progress Note ? ?Patient: Dustin [name]    KDT:[id number]  DOA: 10/05/2021    ?Date of Service: the patient was seen and examined on 10/06/2021 ? ?Brief hospital course: ?66 year old male with past medical history of tobacco abuse, schizophrenia and tardive dyskinesia who presented to the emergency room after being redirected there by his PCP.  2 days prior, blood work came back with a hemoglobin of 6.2 and an MCV of 64.  In addition, patient notes a 15 to 20 pound weight loss over the last 6 months.  Patient also relates a 1 week history of pharyngeal esophageal dysphagia.  Patient was transfused 1 unit packed red blood cells and brought in for further evaluation to the hospitalist service.  GI was consulted and took patient for EGD and colonoscopy on 5/4.  Endoscopy unremarkable, but colonoscopy revealed incidental diverticula and a partially obstructing colonic mass in the ascending colon, highly suspicious for adenocarcinoma of the colon.  Biopsies taken and are pending. ? ?Assessment and Plan: ?Assessment and Plan: ?* Colonic mass ?Highly suspicious for adenocarcinoma of the colon.  Cause of patient's bleeding.  Have ordered CEA level.  Will discuss with oncology. ? ?Microcytic anemia ?Patient already status post 1 unit transfusion.  We will transfuse another unit.  Cause is colonic mass and slow bleed over time.  Likely will also benefit from iron transfusion.  Recheck labs in the morning. ? ? ?Schizoaffective disorder, bipolar type ([place]) ?Pt is getting Invega and Holdal injection ?-prn ativan 0.5 mg bid IV ? ?Tobacco use disorder ?-nicotine patch ? ?Syncope ?Patient cannot provide detailed information.  CT head negative.  No focal neuro deficit on physical examination today.  May be due to volume depletion secondary to GI bleeding ?-Frequent neurochecks ?-check orthostatic vitals ? ? ?Acute blood loss anemia ?Patient has iron deficiency. ?-start ferrous sulfate ?-As needed senna code ?-Give  1 dose of IV iron Feraheme today ? ? ? ? ? ? ?Body mass index is 20.96 kg/m?.  ?  ?   ? ?Consultants: ?Gastroenterology ? ?Procedures: ?Status post 1 unit packed red blood cell transfusion x2 ?EGD done 5/4: Unremarkable ?Colonoscopy done 5/4: Partial obstructing ascending colonic mass, incidental diverticula ? ?Antimicrobials: ?None ? ?Code Status: Full code ? ? ?Subjective: Patient seen posttransfusion.  Hungry ? ?Objective: ?Noted bradycardia, likely residual anesthesia ?Vitals:  ? 10/06/21 1457 10/06/21 1525  ?BP: 112/75 119/81  ?Pulse: (!) 41 (!) 47  ?Resp: 18 18  ?Temp: (!) 97.2 ?F (36.2 ?C) (!) 97.5 ?F (36.4 ?C)  ?SpO2: 100% 100%  ? ? ?Intake/Output Summary (Last 24 hours) at 10/06/2021 1735 ?Last data filed at 10/06/2021 1309 ?Gross per 24 hour  ?Intake 2048.5 ml  ?Output 400 ml  ?Net 1648.5 ml  ? ?Filed Weights  ? 10/05/21 1759  ?Weight: 60.7 kg  ? ?Body mass index is 20.96 kg/m?. ? ?Exam: ? ?General: Alert and oriented x2, no acute distress ?HEENT: Normocephalic and atraumatic, mucous membranes are slightly dry ?Cardiovascular: Regular rhythm, borderline bradycardia ?Respiratory: Clear to auscultation bilaterally ?Abdomen: Soft, nontender, nondistended, positive bowel sounds ?Musculoskeletal: No clubbing or cyanosis or edema ?Skin: No skin breaks, tears or lesions ?Psychiatry: Flattened affect, but no evidence of acute psychosis ?Neurology: No focal deficits ? ?Data Reviewed: ?Posttransfusion, hemoglobin only up to 7.0. ? ?Disposition:  ?Status is: Inpatient ?Remains inpatient appropriate because: Likely will need iron transfusion possibly more blood.  Set up with oncology. ?  ? ?Anticipated discharge date: 5/5 ? ?Remaining issues to be resolved  so that patient can be discharged: Post discharge set up ? ? ?Family Communication: Brother at the bedside ?DVT Prophylaxis: ?SCDs Start: 10/05/21 1419 ? ? ? ?Author: ?Annita Brod ,MD ?10/06/2021 5:35 PM ? ?To reach On-call, see care teams to locate the attending  and reach out via [email or web address]. ?Between 7PM-7AM, please contact night-coverage ?If you still have difficulty reaching the attending provider, please page the [facility] (Director on Call) for [facility] on amion for assistance. ? ?

## 2021-10-06 NOTE — Anesthesia Preprocedure Evaluation (Addendum)
Anesthesia Evaluation  ?Patient identified by MRN, date of birth, ID band ?Patient awake ? ? ? ?Reviewed: ?Allergy & Precautions, NPO status , Patient's Chart, lab work & pertinent test results ? ?History of Anesthesia Complications ?Negative for: history of anesthetic complications ? ?Airway ?Mallampati: III ? ? ?Neck ROM: Full ? ? ? Dental ? ?(+) Poor Dentition ?  ?Pulmonary ?Current Smoker (2 ppd) and Patient abstained from smoking.,  ?  ?Pulmonary exam normal ?breath sounds clear to auscultation ? ? ? ? ? ? Cardiovascular ?Exercise Tolerance: Good ?negative cardio ROS ?Normal cardiovascular exam ?Rhythm:Regular Rate:Normal ? ?ECG 10/05/21: Sinus rhythm, QTc 392, low voltage ?  ?Neuro/Psych ?PSYCHIATRIC DISORDERS Schizophrenia Tardive dyskinesia ?  ? GI/Hepatic ?GI bleed ?  ?Endo/Other  ?negative endocrine ROS ? Renal/GU ?negative Renal ROS  ? ?  ?Musculoskeletal ? ? Abdominal ?  ?Peds ? Hematology ? ?(+) Blood dyscrasia, anemia ,   ?Anesthesia Other Findings ? ? Reproductive/Obstetrics ? ?  ? ? ? ? ? ? ? ? ? ? ? ? ? ?  ?  ? ? ? ? ? ? ? ?Anesthesia Physical ?Anesthesia Plan ? ?ASA: 3 ? ?Anesthesia Plan: General  ? ?Post-op Pain Management:   ? ?Induction: Intravenous ? ?PONV Risk Score and Plan: 1 and Propofol infusion, TIVA and Treatment may vary due to age or medical condition ? ?Airway Management Planned: Natural Airway ? ?Additional Equipment:  ? ?Intra-op Plan:  ? ?Post-operative Plan:  ? ?Informed Consent: I have reviewed the patients History and Physical, chart, labs and discussed the procedure including the risks, benefits and alternatives for the proposed anesthesia with the patient or authorized representative who has indicated his/her understanding and acceptance.  ? ? ? ? ? ?Plan Discussed with: CRNA ? ?Anesthesia Plan Comments: (LMA/GETA backup discussed.  Patient consented for risks of anesthesia including but not limited to:  ?- adverse reactions to medications ?-  damage to eyes, teeth, lips or other oral mucosa ?- nerve damage due to positioning  ?- sore throat or hoarseness ?- damage to heart, brain, nerves, lungs, other parts of body or loss of life ? ?Informed patient about role of CRNA in peri- and intra-operative care.  Patient voiced understanding.)  ? ? ? ? ? ? ?Anesthesia Quick Evaluation ? ?

## 2021-10-07 ENCOUNTER — Encounter: Payer: Self-pay | Admitting: Internal Medicine

## 2021-10-07 DIAGNOSIS — D500 Iron deficiency anemia secondary to blood loss (chronic): Secondary | ICD-10-CM | POA: Diagnosis not present

## 2021-10-07 DIAGNOSIS — D509 Iron deficiency anemia, unspecified: Secondary | ICD-10-CM | POA: Diagnosis not present

## 2021-10-07 DIAGNOSIS — K6389 Other specified diseases of intestine: Secondary | ICD-10-CM | POA: Diagnosis not present

## 2021-10-07 DIAGNOSIS — F250 Schizoaffective disorder, bipolar type: Secondary | ICD-10-CM | POA: Diagnosis not present

## 2021-10-07 DIAGNOSIS — D649 Anemia, unspecified: Secondary | ICD-10-CM | POA: Diagnosis not present

## 2021-10-07 LAB — TYPE AND SCREEN
ABO/RH(D): B POS
Antibody Screen: NEGATIVE
Unit division: 0
Unit division: 0

## 2021-10-07 LAB — BASIC METABOLIC PANEL
Anion gap: 4 — ABNORMAL LOW (ref 5–15)
BUN: 13 mg/dL (ref 8–23)
CO2: 24 mmol/L (ref 22–32)
Calcium: 8.6 mg/dL — ABNORMAL LOW (ref 8.9–10.3)
Chloride: 108 mmol/L (ref 98–111)
Creatinine, Ser: 0.82 mg/dL (ref 0.61–1.24)
GFR, Estimated: >60 mL/min (ref >60)
Glucose, Bld: 110 mg/dL — ABNORMAL HIGH (ref 70–99)
Potassium: 4.6 mmol/L (ref 3.5–5.1)
Sodium: 136 mmol/L (ref 135–145)

## 2021-10-07 LAB — CBC
HCT: 27.7 % — ABNORMAL LOW (ref 39.0–52.0)
Hemoglobin: 7.7 g/dL — ABNORMAL LOW (ref 13.0–17.0)
MCH: 19 pg — ABNORMAL LOW (ref 26.0–34.0)
MCHC: 27.8 g/dL — ABNORMAL LOW (ref 30.0–36.0)
MCV: 68.2 fL — ABNORMAL LOW (ref 80.0–100.0)
Platelets: 309 10*3/uL (ref 150–400)
RBC: 4.06 MIL/uL — ABNORMAL LOW (ref 4.22–5.81)
RDW: 24.1 % — ABNORMAL HIGH (ref 11.5–15.5)
WBC: 7.5 10*3/uL (ref 4.0–10.5)
nRBC: 0 % (ref 0.0–0.2)

## 2021-10-07 LAB — GLUCOSE, CAPILLARY: Glucose-Capillary: 118 mg/dL — ABNORMAL HIGH (ref 70–99)

## 2021-10-07 LAB — BPAM RBC
Blood Product Expiration Date: 202305092359
Blood Product Expiration Date: 202305212359
ISSUE DATE / TIME: 202305031559
ISSUE DATE / TIME: 202305041459
Unit Type and Rh: 7300
Unit Type and Rh: 7300

## 2021-10-07 LAB — SURGICAL PATHOLOGY

## 2021-10-07 NOTE — Consult Note (Signed)
? ?Hematology/Oncology Consult note ?[phone] [phone] ? ?  ? ? ?Patient Care Team: ?Jonetta Osgood, NP as PCP - General (Nurse Practitioner)  ? ?Name of the patient: Dustin [name]  ?[id number]  ?[birth year]  ? ?Date of visit: 10/07/21 ?REASON FOR COSULTATION:  ?Colon mass ?History of presenting illness-  ?66 y.o. male with PMH listed at below who presents to ER for evaluation of low blood levels.  ? ?10/03/2021 present with hb 6.2, ferritin 6, iron saturation 3.  ? s/p PRBC transfusion and 1 dose of IV feraheme.  ? ?10/06/2021 colonoscopy showed partially obstructing ascending colon mass. Biopsed. Pathology pending. CEA is pending.  ? ?Oncology was consulted for further evaluation and management.  ? ?Patient has schizophrenia disorder, poor historian. His broth is his POA> ? ? ?Review of Systems  ?Constitutional:  Positive for appetite change, fatigue and unexpected weight change. Negative for chills and fever.  ?HENT:   Negative for hearing loss and voice change.   ?Eyes:  Negative for eye problems and icterus.  ?Respiratory:  Negative for chest tightness, cough and shortness of breath.   ?Cardiovascular:  Negative for chest pain and leg swelling.  ?Gastrointestinal:  Positive for blood in stool. Negative for abdominal distention and abdominal pain.  ?Endocrine: Negative for hot flashes.  ?Genitourinary:  Negative for difficulty urinating, dysuria and frequency.   ?Musculoskeletal:  Negative for arthralgias.  ?Skin:  Negative for itching and rash.  ?Neurological:  Negative for light-headedness and numbness.  ?Hematological:  Negative for adenopathy. Does not bruise/bleed easily.  ?Psychiatric/Behavioral:  Negative for confusion.   ? ?No Known Allergies ? ?Patient Active Problem List  ? Diagnosis Date Noted  ? Colon cancer ([place]) 10/06/2021  ? Colonic mass 10/06/2021  ? Microcytic anemia 10/05/2021  ? Acute blood loss anemia 10/05/2021  ? Syncope 10/05/2021  ? Symptomatic anemia   ?  Schizoaffective disorder, bipolar type ([place]) 01/24/2017  ? Tardive dyskinesia 01/24/2017  ? Tobacco use disorder 11/29/2016  ? ? ? ?Past Medical History:  ?Diagnosis Date  ? Schizophrenia ([place])   ? ? ? ?Past Surgical History:  ?Procedure Laterality Date  ? COLONOSCOPY N/A 10/06/2021  ? Procedure: COLONOSCOPY;  Surgeon: Toledo, Benay Pike, MD;  Location: [facility] ENDOSCOPY;  Service: Gastroenterology;  Laterality: N/A;  ? ESOPHAGOGASTRODUODENOSCOPY N/A 10/06/2021  ? Procedure: ESOPHAGOGASTRODUODENOSCOPY (EGD);  Surgeon: Toledo, Benay Pike, MD;  Location: [facility] ENDOSCOPY;  Service: Gastroenterology;  Laterality: N/A;  ? ? ?Social History  ? ?Socioeconomic History  ? Marital status: Single  ?  Spouse name: Not on file  ? Number of children: Not on file  ? Years of education: Not on file  ? Highest education level: Not on file  ?Occupational History  ? Not on file  ?Tobacco Use  ? Smoking status: Every Day  ?  Packs/day: 0.50  ?  Years: 15.00  ?  Pack years: 7.50  ?  Types: Cigarettes  ? Smokeless tobacco: Never  ?Vaping Use  ? Vaping Use: Never used  ?Substance and Sexual Activity  ? Alcohol use: Not Currently  ? Drug use: No  ? Sexual activity: Never  ?  Birth control/protection: None  ?Other Topics Concern  ? Not on file  ?Social History Narrative  ? Not on file  ? ?Social Determinants of Health  ? ?Financial Resource Strain: Not on file  ?Food Insecurity: Not on file  ?Transportation Needs: Not on file  ?Physical Activity: Not on file  ?Stress: Not on file  ?Social Connections: Not on  file  ?Intimate Partner Violence: Not on file  ? ?  ?Family History  ?Problem Relation Age of Onset  ? Lung cancer Father   ? Hypertension Brother   ? Hypertension Brother   ? Hypertension Brother   ? Prostate cancer Brother   ? ? ? ?Current Facility-Administered Medications:  ?  acetaminophen (TYLENOL) tablet 650 mg, 650 mg, Oral, Q6H PRN, Ivor Costa, MD ?  ferrous sulfate tablet 325 mg, 325 mg, Oral, BID WC, Ivor Costa, MD, 325 mg at 10/07/21  4166 ?  LORazepam (ATIVAN) injection 0.5 mg, 0.5 mg, Intravenous, Q12H PRN, Ivor Costa, MD ?  nicotine (NICODERM CQ - dosed in mg/24 hours) patch 21 mg, 21 mg, Transdermal, Daily, Ivor Costa, MD, 21 mg at 10/07/21 0954 ?  ondansetron (ZOFRAN) injection 4 mg, 4 mg, Intravenous, Q8H PRN, Ivor Costa, MD ?  pantoprazole (PROTONIX) EC tablet 40 mg, 40 mg, Oral, BID, Croley, Granville M, PA-C, 40 mg at 10/07/21 0630 ?  senna (SENOKOT) tablet 8.6 mg, 1 tablet, Oral, Daily PRN, Ivor Costa, MD ? ? ?Physical exam:  ?Vitals:  ? 10/07/21 0339 10/07/21 0449 10/07/21 0511 10/07/21 0824  ?BP: (!) 96/52 (!) 80/57 (!) 92/59 (!) 92/59  ?Pulse: 62 85 65 60  ?Resp: 20   16  ?Temp: 98 ?F (36.7 ?C)   98.1 ?F (36.7 ?C)  ?TempSrc:    Oral  ?SpO2: 100%   100%  ?Weight:      ?Height:      ? ?Physical Exam ?Constitutional:   ?   General: He is not in acute distress. ?   Appearance: He is not diaphoretic.  ?   Comments: thin  ?HENT:  ?   Head: Normocephalic and atraumatic.  ?   Nose: Nose normal.  ?   Mouth/Throat:  ?   Pharynx: No oropharyngeal exudate.  ?Eyes:  ?   General: No scleral icterus. ?   Pupils: Pupils are equal, round, and reactive to light.  ?Cardiovascular:  ?   Rate and Rhythm: Normal rate and regular rhythm.  ?   Heart sounds: No murmur heard. ?Pulmonary:  ?   Effort: Pulmonary effort is normal. No respiratory distress.  ?   Breath sounds: No rales.  ?Chest:  ?   Chest wall: No tenderness.  ?Abdominal:  ?   General: There is no distension.  ?   Palpations: Abdomen is soft.  ?   Tenderness: There is no abdominal tenderness.  ?Musculoskeletal:     ?   General: Normal range of motion.  ?   Cervical back: Normal range of motion and neck supple.  ?Skin: ?   General: Skin is warm and dry.  ?   Findings: No erythema.  ?Neurological:  ?   Mental Status: He is alert. Mental status is at baseline.  ?   Cranial Nerves: No cranial nerve deficit.  ?   Motor: No abnormal muscle tone.  ?   Coordination: Coordination normal.  ?   Comments:  Slow speech  ?Psychiatric:     ?   Mood and Affect: Affect normal.  ?  ? ? ? ? ? ?  Latest Ref Rng & Units 10/07/2021  ?  4:12 AM  ?CMP  ?Glucose 70 - 99 mg/dL 110    ?BUN 8 - 23 mg/dL 13    ?Creatinine 0.61 - 1.24 mg/dL 0.82    ?Sodium 135 - 145 mmol/L 136    ?Potassium 3.5 - 5.1 mmol/L 4.6    ?  Chloride 98 - 111 mmol/L 108    ?CO2 22 - 32 mmol/L 24    ?Calcium 8.9 - 10.3 mg/dL 8.6    ? ? ?  Latest Ref Rng & Units 10/07/2021  ?  4:12 AM  ?CBC  ?WBC 4.0 - 10.5 K/uL 7.5    ?Hemoglobin 13.0 - 17.0 g/dL 7.7    ?Hematocrit 39.0 - 52.0 % 27.7    ?Platelets 150 - 400 K/uL 309    ? ? ?RADIOGRAPHIC STUDIES: ?I have personally reviewed the radiological images as listed and agreed with the findings in the report. ?DG Chest 2 View ? ?Result Date: 10/03/2021 ?CLINICAL DATA:  Unintentional weight loss, tobacco abuse EXAM: CHEST - 2 VIEW COMPARISON:  None. FINDINGS: Frontal and lateral views of the chest demonstrate an unremarkable cardiac silhouette. The lungs are hyperinflated with background interstitial prominence consistent with emphysema. No airspace disease, effusion, or pneumothorax. There are no acute bony abnormalities. IMPRESSION: 1. Emphysema.  No acute intrathoracic process. Electronically Signed   By: Randa Ngo M.D.   On: 10/03/2021 17:17  ? ?CT HEAD WO CONTRAST (5MM) ? ?Result Date: 10/05/2021 ?CLINICAL DATA:  Altered mental status EXAM: CT HEAD WITHOUT CONTRAST TECHNIQUE: Contiguous axial images were obtained from the base of the skull through the vertex without intravenous contrast. RADIATION DOSE REDUCTION: This exam was performed according to the departmental dose-optimization program which includes automated exposure control, adjustment of the mA and/or kV according to patient size and/or use of iterative reconstruction technique. COMPARISON:  None Available. FINDINGS: Brain: No acute territorial infarction, hemorrhage or intracranial mass. Mild atrophy. Nonenlarged ventricles. Vascular: No hyperdense vessels.   Carotid vascular calcification. Skull: Normal. Negative for fracture or focal lesion. Small defects within the parietal bones posteriorly. Sinuses/Orbits: No acute finding. Other: None IMPRESSION: 1. No CT evidence for

## 2021-10-07 NOTE — Progress Notes (Signed)
[facility] Progress Note ? ?Patient: Dustin [name]    IAX:[id number]  DOA: 10/05/2021    ?Date of Service: the patient was seen and examined on 10/07/2021 ? ?Brief hospital course: ?66 year old male with past medical history of tobacco abuse, schizophrenia and tardive dyskinesia who presented to the emergency room after being redirected there by his PCP.  2 days prior, blood work came back with a hemoglobin of 6.2 and an MCV of 64.  In addition, patient notes a 15 to 20 pound weight loss over the last 6 months.  Patient also relates a 1 week history of pharyngeal esophageal dysphagia.  Patient was transfused 1 unit packed red blood cells and brought in for further evaluation to the hospitalist service.  GI was consulted and took patient for EGD and colonoscopy on 5/4.  Endoscopy unremarkable, but colonoscopy revealed incidental diverticula and a partially obstructing colonic mass in the ascending colon, highly suspicious for adenocarcinoma of the colon.  Biopsies taken and are pending. ? ?Assessment and Plan: ?Assessment and Plan: ?* Colonic mass ?Highly suspicious for adenocarcinoma of the colon.  Cause of patient's bleeding.  Have ordered CEA level.  Oncology will see prior to discharge. ? ?Microcytic anemia ?Patient already status post 1 unit transfusion.  We will transfuse another unit.  Cause is colonic mass and slow bleed over time.  Also received iron.  Hemoglobin stable around 7.7. ? ? ?Schizoaffective disorder, bipolar type ([place]) ?Pt is getting Invega and Holdal injection ?-prn ativan 0.5 mg bid IV ?Patient seems a bit more manic today with some bizarre ideas vocalized. ? ?Tobacco use disorder ?-nicotine patch ? ?Syncope ?Patient cannot provide detailed information.  CT head negative.  No focal neuro deficit on physical examination today.  May be due to volume depletion secondary to GI bleeding ?-Frequent neurochecks ?-check orthostatic vitals ? ? ?Acute blood loss anemia ?Patient has iron  deficiency. ?-start ferrous sulfate ?-As needed senna code ?-Give 1 dose of IV iron Feraheme today ? ? ? ? ? ? ?Body mass index is 20.96 kg/m?.  ?  ?   ? ?Consultants: ?Gastroenterology ? ?Procedures: ?Status post 1 unit packed red blood cell transfusion x2 ?EGD done 5/4: Unremarkable ?Colonoscopy done 5/4: Partial obstructing ascending colonic mass, incidental diverticula ? ?Antimicrobials: ?None ? ?Code Status: Full code ? ? ?Subjective: Patient doing okay, no complaints.  However, he does have some bizarre affect patient's. ? ?Objective: ?Noted bradycardia, likely residual anesthesia ?Vitals:  ? 10/07/21 0824 10/07/21 1508  ?BP: (!) 92/59 108/90  ?Pulse: 60 77  ?Resp: 16 16  ?Temp: 98.1 ?F (36.7 ?C) 98 ?F (36.7 ?C)  ?SpO2: 100% 100%  ? ? ?Intake/Output Summary (Last 24 hours) at 10/07/2021 1859 ?Last data filed at 10/07/2021 1000 ?Gross per 24 hour  ?Intake 320 ml  ?Output 600 ml  ?Net -280 ml  ? ?Filed Weights  ? 10/05/21 1759  ?Weight: 60.7 kg  ? ?Body mass index is 20.96 kg/m?. ? ?Exam: ? ?General: Alert and oriented x2, no acute distress ?HEENT: Normocephalic and atraumatic, mucous membranes are slightly dry ?Cardiovascular: Regular rhythm, S1-S2 ?Respiratory: Clear to auscultation bilaterally ?Abdomen: Soft, nontender, nondistended, positive bowel sounds ?Musculoskeletal: No clubbing or cyanosis or edema ?Skin: No skin breaks, tears or lesions ?Psychiatry: Flattened affect, random thought affectation ?Neurology: No focal deficits ? ?Data Reviewed: ?Hemoglobin stable at 7.7 ? ?Disposition:  ?Status is: Inpatient ?Remains inpatient appropriate because: To be seen by oncology ?  ? ?Anticipated discharge date: 5/6 ? ? ?Family Communication: Left  message for brother ?DVT Prophylaxis: ?SCDs Start: 10/05/21 1419 ? ? ? ?Author: ?Annita Brod ,MD ?10/07/2021 6:59 PM ? ?To reach On-call, see care teams to locate the attending and reach out via [email or web address]. ?Between 7PM-7AM, please contact night-coverage ?If you still  have difficulty reaching the attending provider, please page the [facility] (Director on Call) for [facility] on amion for assistance. ? ?

## 2021-10-07 NOTE — Progress Notes (Signed)
Mobility Specialist - Progress Note ? ? ? 10/07/21 1049  ?Mobility  ?Activity Ambulated with assistance in hallway;Stood at bedside;Transferred from chair to bed;Dangled on edge of bed  ?Level of Assistance Minimal assist, patient does 75% or more  ?Assistive Device Front wheel walker  ?Distance Ambulated (ft) 80 ft  ?Activity Response Tolerated well  ?$Mobility charge 1 Mobility  ? ? ?Pt supine upon arrival using RA. Pt alert but oriented to self only. Pt requests B-C transfer, author encouraged hallway ambulation. Completed bed mobility ModI and STS with SBA. Pt ambulates with RW --- significant LE weakness,vc to stand upright but unable to do so d/t knee buckling. Pt tolerates well overall and is transferred to recliner with needs in reach and alarm set. ? ?Merrily Brittle ?Mobility Specialist ?10/07/21, 10:53 AM ? ? ? ? ?

## 2021-10-08 DIAGNOSIS — D500 Iron deficiency anemia secondary to blood loss (chronic): Secondary | ICD-10-CM | POA: Diagnosis not present

## 2021-10-08 DIAGNOSIS — K6389 Other specified diseases of intestine: Secondary | ICD-10-CM | POA: Diagnosis not present

## 2021-10-08 DIAGNOSIS — F250 Schizoaffective disorder, bipolar type: Secondary | ICD-10-CM | POA: Diagnosis not present

## 2021-10-08 DIAGNOSIS — F17200 Nicotine dependence, unspecified, uncomplicated: Secondary | ICD-10-CM | POA: Diagnosis not present

## 2021-10-08 LAB — RETIC PANEL
Immature Retic Fract: 24.8 % — ABNORMAL HIGH (ref 2.3–15.9)
RBC.: 4.11 MIL/uL — ABNORMAL LOW (ref 4.22–5.81)
Retic Count, Absolute: 44.8 10*3/uL (ref 19.0–186.0)
Retic Ct Pct: 1.1 % (ref 0.4–3.1)
Reticulocyte Hemoglobin: 22 pg — ABNORMAL LOW (ref >27.9)

## 2021-10-08 LAB — CBC
HCT: 28.9 % — ABNORMAL LOW (ref 39.0–52.0)
Hemoglobin: 7.8 g/dL — ABNORMAL LOW (ref 13.0–17.0)
MCH: 18.8 pg — ABNORMAL LOW (ref 26.0–34.0)
MCHC: 27 g/dL — ABNORMAL LOW (ref 30.0–36.0)
MCV: 69.8 fL — ABNORMAL LOW (ref 80.0–100.0)
Platelets: 312 10*3/uL (ref 150–400)
RBC: 4.14 MIL/uL — ABNORMAL LOW (ref 4.22–5.81)
RDW: 25.7 % — ABNORMAL HIGH (ref 11.5–15.5)
WBC: 6.8 10*3/uL (ref 4.0–10.5)
nRBC: 0.6 % — ABNORMAL HIGH (ref 0.0–0.2)

## 2021-10-08 LAB — CEA: CEA: 6 ng/mL — ABNORMAL HIGH (ref 0.0–4.7)

## 2021-10-08 LAB — GLUCOSE, CAPILLARY
Glucose-Capillary: 95 mg/dL (ref 70–99)
Glucose-Capillary: 105 mg/dL — ABNORMAL HIGH (ref 70–99)

## 2021-10-08 MED ORDER — BISMUTH SUBSALICYLATE 262 MG PO CHEW
524.0000 mg | CHEWABLE_TABLET | Freq: Four times a day (QID) | ORAL | Status: DC
  Administered 2021-10-08 – 2021-10-18 (×28): 524 mg via ORAL
  Filled 2021-10-08 (×44): qty 2

## 2021-10-08 MED ORDER — METRONIDAZOLE 250 MG PO TABS
250.0000 mg | ORAL_TABLET | Freq: Four times a day (QID) | ORAL | Status: DC
  Administered 2021-10-08 – 2021-10-18 (×38): 250 mg via ORAL
  Filled 2021-10-08 (×42): qty 1

## 2021-10-08 MED ORDER — TETRACYCLINE HCL 250 MG PO CAPS
500.0000 mg | ORAL_CAPSULE | Freq: Four times a day (QID) | ORAL | Status: DC
  Administered 2021-10-08 – 2021-10-18 (×37): 500 mg via ORAL
  Filled 2021-10-08 (×43): qty 2

## 2021-10-08 NOTE — Progress Notes (Signed)
Mobility Specialist - Progress Note ? ? 10/08/21 1559  ?Mobility  ?Activity Ambulated with assistance in hallway;Stood at bedside;Dangled on edge of bed;Transferred from bed to chair  ?Level of Assistance Contact guard assist, steadying assist  ?Assistive Device Front wheel walker  ?Activity Response Tolerated well  ?$Mobility charge 1 Mobility  ? ? ?Pt semi supine upon arrival using RA. Pt completes STS with ModI and ambulates 16f --- general LE weakness but less buckling this date. Pt is left in chair with needs in reach and chair alarm set. ? ?MMerrily Brittle?Mobility Specialist ?10/08/21, 4:04 PM ? ? ? ?

## 2021-10-08 NOTE — Assessment & Plan Note (Addendum)
Also received iron transfusion, and MCV improving ?

## 2021-10-08 NOTE — Progress Notes (Signed)
[facility] Progress Note ? ?Patient: Dustin [name]    WJX:[id number]  DOA: 10/05/2021    ?Date of Service: the patient was seen and examined on 10/08/2021 ? ?Brief hospital course: ?66 year old male with past medical history of tobacco abuse, schizophrenia and tardive dyskinesia who presented to the emergency room after being redirected there by his PCP.  2 days prior, blood work came back with a hemoglobin of 6.2 and an MCV of 64.  In addition, patient notes a 15 to 20 pound weight loss over the last 6 months.  Patient also relates a 1 week history of pharyngeal esophageal dysphagia.  Patient was transfused 1 unit packed red blood cells and brought in for further evaluation to the hospitalist service.  GI was consulted and took patient for EGD and colonoscopy on 5/4.  Endoscopy unremarkable, but colonoscopy revealed incidental diverticula and a partially obstructing colonic mass in the ascending colon.  Interestingly, biopsies note tubulovillous adenoma and not adenocarcinoma of the colon.  General surgery has been consulted for colonic mass removal.  Psychiatry consulted for psychosis behavior. ? ?Assessment and Plan: ?Assessment and Plan: ?* Colonic mass ?CEA level only at 6.  Biopsy samples on the note tubulovillous adenoma and not for adenocarcinoma of colon.  Cannot fully rule out malignancy without removing mass concerning for pathology.  General surgery has been consulted.  Oncology following peripherally. ? ?Microcytic anemia ?Patient already status post 1 unit transfusion.  We will transfuse another unit.  Cause is colonic mass and slow bleed over time.  Also received iron.  Hemoglobin stable around 7.7-7.8. ? ? ?Schizoaffective disorder, bipolar type ([place]) ?Pt is getting Invega and Holdal injection ?-prn ativan 0.5 mg bid IV ?Patient appeared to have more psychotic behavior on 5/5 although less so today.  Psychiatry consulted. ? ?Tobacco use disorder ?-nicotine patch ? ?Iron deficiency anemia due  to chronic blood loss ?Also received iron transfusion ? ?Syncope ?Patient cannot provide detailed information.  CT head negative.  No focal neuro deficit on physical examination today.  May be due to volume depletion secondary to GI bleeding ?-Frequent neurochecks ?-check orthostatic vitals ? ? ?Acute blood loss anemia ?Patient has iron deficiency. ?-start ferrous sulfate ?-As needed senna code ?-Give 1 dose of IV iron Feraheme today ? ? ? ? ? ? ?Body mass index is 20.96 kg/m?.  ?  ?   ? ?Consultants: ?Gastroenterology ?General surgery ?Psychiatry ? ?Procedures: ?Status post 1 unit packed red blood cell transfusion x2 ?EGD done 5/4: Unremarkable ?Colonoscopy done 5/4: Partial obstructing ascending colonic mass, incidental diverticula ? ?Antimicrobials: ?None ? ?Code Status: Full code ? ? ?Subjective: Patient with no complaints ? ?Objective: ?Noted bradycardia, likely residual anesthesia ?Vitals:  ? 10/08/21 0603 10/08/21 0758  ?BP:  120/78  ?Pulse: 67 (!) 56  ?Resp:  18  ?Temp:  (!) 97.4 ?F (36.3 ?C)  ?SpO2: 100% 100%  ? ? ?Intake/Output Summary (Last 24 hours) at 10/08/2021 1334 ?Last data filed at 10/08/2021 1028 ?Gross per 24 hour  ?Intake 600 ml  ?Output 1750 ml  ?Net -1150 ml  ? ? ?Filed Weights  ? 10/05/21 1759  ?Weight: 60.7 kg  ? ?Body mass index is 20.96 kg/m?. ? ?Exam: ? ?General: Alert and oriented x2, no acute distress ?HEENT: Normocephalic and atraumatic, mucous membranes are slightly dry ?Cardiovascular: Regular rhythm, borderline bradycardia ?Respiratory: Clear to auscultation bilaterally ?Abdomen: Soft, nontender, nondistended, positive bowel sounds ?Musculoskeletal: No clubbing or cyanosis or edema ?Skin: No skin breaks, tears or lesions ?Psychiatry: Gregary Cromer  affect, more clear thinking today ?Neurology: No focal deficits ? ?Data Reviewed: ?Hemoglobin today at 7.8 ? ?Disposition:  ?Status is: Inpatient ?Remains inpatient appropriate because: General surgery evaluation, potential mass  removal ? ?Anticipated discharge date: 5/9 ? ?Remaining issues to be resolved so that patient can be discharged: Post discharge set up ? ? ?Family Communication: Updated brother by phone ?DVT Prophylaxis: ?SCDs Start: 10/05/21 1419 ? ? ? ?Author: ?Annita Brod ,MD ?10/08/2021 1:34 PM ? ?To reach On-call, see care teams to locate the attending and reach out via [email or web address]. ?Between 7PM-7AM, please contact night-coverage ?If you still have difficulty reaching the attending provider, please page the [facility] (Director on Call) for [facility] on amion for assistance. ? ?

## 2021-10-08 NOTE — Progress Notes (Addendum)
? ?GI Inpatient Follow-up Note ? ?Subjective: ? ?Patient seen in follow-up for ascending colon mass suspicious for underlying malignancy, severe iron-deficiency anemia 2/2 chronic GI blood loss, and unintentional weight loss. No acute events overnight. He denies any abdominal pain, nausea, vomiting, diarrhea, or rectal bleeding.  ? ?Scheduled Inpatient Medications:  ? ferrous sulfate  325 mg Oral BID WC  ? nicotine  21 mg Transdermal Daily  ? pantoprazole  40 mg Oral BID  ?  ? ?PRN Inpatient Medications:  ?acetaminophen, LORazepam, ondansetron (ZOFRAN) IV, senna ? ?Review of Systems: ?Constitutional: +++ weight loss, + fatigue ?Eyes: No changes in vision. ?ENT: No oral lesions, sore throat.  ?GI: see HPI.  ?Heme/Lymph: No easy bruising.  ?CV: No chest pain.  ?GU: No hematuria.  ?Integumentary: No rashes.  ?Neuro: No headaches.  ?Psych: No depression/anxiety.  ?Endocrine: No heat/cold intolerance.  ?Allergic/Immunologic: No urticaria.  ?Resp: No cough, SOB.  ?Musculoskeletal: No joint swelling.  ?  ?Physical Examination: ?BP 120/78 (BP Location: Right Arm)   Pulse (!) 56   Temp (!) 97.4 ?F (36.3 ?C) (Oral)   Resp 18   Ht '5\' 7"'$  (1.702 m)   Wt 60.7 kg   SpO2 100%   BMI 20.96 kg/m?  ?Gen: NAD, alert and oriented x 4 ?HEENT: PEERLA, EOMI, ?Neck: supple, no JVD or thyromegaly ?Chest: CTA bilaterally, no wheezes, crackles, or other adventitious sounds ?CV: RRR, no m/g/c/r ?Abd: soft, NT, ND, +BS in all four quadrants; no HSM, guarding, ridigity, or rebound tenderness ?Ext: no edema, well perfused with 2+ pulses, ?Skin: no rash or lesions noted ?Lymph: no LAD ? ?Data: ?Lab Results  ?Component Value Date  ? WBC 6.8 10/08/2021  ? HGB 7.8 (L) 10/08/2021  ? HCT 28.9 (L) 10/08/2021  ? MCV 69.8 (L) 10/08/2021  ? PLT 312 10/08/2021  ? ?Recent Labs  ?Lab 10/06/21 ?2243 10/07/21 ?8469 10/08/21 ?6295  ?HGB 7.7* 7.7* 7.8*  ? ?Lab Results  ?Component Value Date  ? NA 136 10/07/2021  ? K 4.6 10/07/2021  ? CL 108 10/07/2021  ?  CO2 24 10/07/2021  ? BUN 13 10/07/2021  ? CREATININE 0.82 10/07/2021  ? ?Lab Results  ?Component Value Date  ? ALT 13 10/05/2021  ? AST 19 10/05/2021  ? ALKPHOS 66 10/05/2021  ? BILITOT 0.5 10/05/2021  ? ?Recent Labs  ?Lab 10/05/21 ?1410  ?APTT 34  ?INR 1.2  ? ?EGD 10/06/21: ?- Normal esophagus. ?- Nodular mucosa in the gastric body. Biopsied. ?- Normal examined duodenum. ?- The examination was otherwise normal. ? ?CSY 10/06/21: ?- Preparation of the colon was poor. ?- Internal hemorrhoids that do not return to the anal canal, thus continuously prolapsed (Grade IV) found on perianal exam. ?- Stool in the entire examined colon. ?- Likely malignant partially obstructing tumor in the proximal ascending colon. Biopsied. Injected. ?- Diverticulosis in the sigmoid colon. ?- The examination was otherwise normal. ? ?Surgical Pathology 10/06/21: ?DIAGNOSIS:  ?A. STOMACH, BODY; COLD BIOPSY:  ?- CHRONIC ACTIVE GASTRITIS WITH HELICOBACTER PYLORI TYPE ORGANISMS.  ?- INCIDENTAL GASTIC XANTHOMA.  ?- NEGATIVE FOR DYSPLASIA AND MALIGNANCY.  ? ?B. COLON MASS, ASCENDING; COLD BIOPSY:  ?- FRAGMENTS OF TUBULOVILLOUS ADENOMA.  ?- NEGATIVE FOR HIGH-GRADE DYSPLASIA AND MALIGNANCY.  ? ?Assessment/Plan: ? ?66 y/o AA male with a PMH of Schizoaffective disorder, tobacco abuse, and tardive dyskinesia 2/2 long-term use of antipsychotic medications admitted 5/3 for severe symptomatic/iron-deficiency anemia, unintentional weight loss, and elevated CEA. He is s/p bidirectional endoscopy 5/4 with Dr. Alice Reichert which showed  ascending colon mass concerning for malignancy.  ? ? ?Ascending colon mass - pathology showing fragments of TVA negative for high-grade dysplasia and malignancy ? ?2.   Iron-deficiency anemia 2/2 chronic GI blood loss ? ?3.   H pylori gastritis  ? ?4.   Schizoaffective disorder  ? ?Recommendations: ? ?- Reviewed pathology results with patient in room today ?- He will need evaluation by General Surgery to discuss removal of ascending colon  mass. Malignancy is not entirely ruled out.  ?- Initiate H pylori quadruple therapy for eradication for 14 days with Protonix 40 mg PO BID, Bismuth 524 mg QID, Flagyl 250 mg QID, and Tetracycline 500 mg QID ?- H&H stable currently with no overt GI blood loss ?- Continue to monitor H&H closely. Transfuse for Hgb <7.0.  ?- Continue oral iron supplementation ?- He can follow-up with GI outpatient to discuss H pylori and confirm eradication  ?- Oncology on board. Appreciate recs.  ?- Advance diet as tolerated ?- GI will sign off at this time.  ?- I called patient's brother, Serafina Royals, at his request and provided summary of the above plan of care. I left detailed voicemail.  ? ? ?Please call with questions or concerns. ? ? ?Octavia Bruckner, PA-C ?[facility] ?[phone] ? ? ? ?

## 2021-10-08 NOTE — Consult Note (Signed)
Dustin [name] Psychiatry Consult   Reason for Consult:  schizoaffective d/o Referring Physician:  Dr Dustin [name] Patient Identification: Dustin [name] MRN:  [id number] Principal Diagnosis: Colonic mass Diagnosis:  Principal Problem:   Colonic mass Active Problems:   Schizoaffective disorder, bipolar type ([place])   Tobacco use disorder   Microcytic anemia   Syncope   Colon cancer ([place])   Iron deficiency anemia due to chronic blood loss   Total Time spent with patient: 45 minutes  Subjective:   Dustin [name] is a 66 y.o. male patient admitted with abdominal issues, history of schizoaffective d/o.  HPI:  66 yo male presented to the ED with abdominal issues, colon mass discovered.  He has a history of schizoaffective d/o and has an ACT team via [employer].  Currently, he is taking [place] and Haldol dec monthly.  He received these at the beginning of each month and just got these.  Denies hallucinations, paranoia, suicidal/homicidal ideations, or substance abuse.  He does have anxiety, mild to moderate, no panic attacks.  He lives alone in [place] and has a lady that comes to cook for him and "check on me".  Dustin [name] also has a brother that is a good support system.  Past Psychiatric History: schizoaffective disorder bipolar type, anxiety  Risk to Self:  none Risk to Others:  none Prior Inpatient Therapy:  yes Prior Outpatient Therapy:  Dustin [name]  Past Medical History:  Past Medical History:  Diagnosis Date   Schizophrenia [facility])     Past Surgical History:  Procedure Laterality Date   COLONOSCOPY N/A 10/06/2021   Procedure: COLONOSCOPY;  Surgeon: Toledo, Benay Pike, MD;  Location: [facility] ENDOSCOPY;  Service: Gastroenterology;  Laterality: N/A;   ESOPHAGOGASTRODUODENOSCOPY N/A 10/06/2021   Procedure: ESOPHAGOGASTRODUODENOSCOPY (EGD);  Surgeon: Toledo, Benay Pike, MD;  Location: [facility] ENDOSCOPY;  Service: Gastroenterology;  Laterality: N/A;   Family History:  Family  History  Problem Relation Age of Onset   Lung cancer Father    Hypertension Brother    Hypertension Brother    Hypertension Brother    Prostate cancer Brother    Family Psychiatric  History: none Social History:  Social History   Substance and Sexual Activity  Alcohol Use Not Currently     Social History   Substance and Sexual Activity  Drug Use No    Social History   Socioeconomic History   Marital status: Single    Spouse name: Not on file   Number of children: Not on file   Years of education: Not on file   Highest education level: Not on file  Occupational History   Not on file  Tobacco Use   Smoking status: Every Day    Packs/day: 0.50    Years: 15.00    Pack years: 7.50    Types: Cigarettes   Smokeless tobacco: Never  Vaping Use   Vaping Use: Never used  Substance and Sexual Activity   Alcohol use: Not Currently   Drug use: No   Sexual activity: Never    Birth control/protection: None  Other Topics Concern   Not on file  Social History Narrative   Not on file   Social Determinants of Health   Financial Resource Strain: Not on file  Food Insecurity: Not on file  Transportation Needs: Not on file  Physical Activity: Not on file  Stress: Not on file  Social Connections: Not on file   Additional Social History:    Allergies:  No Known Allergies  Labs:  Results for orders placed or performed during the hospital encounter of 10/05/21 (from the past 48 hour(s))  Surgical pathology     Status: None   Collection Time: 10/06/21 12:44 PM  Result Value Ref Range   SURGICAL PATHOLOGY      SURGICAL PATHOLOGY CASE: [id number] PATIENT: Dustin [name] Surgical Pathology Report     Specimen Submitted: A. Stomach, body; cbx B. Colon, ascending, mass; cbx  Clinical History: Severe iron deficiency anemia, symptomatic anemia, hematochezia, elevated CEA, pharyngoesophageal dysphagia, unintentional weight loss.  Gastritis, diverticulosis, hemorrhoids,  ascending colon mass    DIAGNOSIS: A. 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 - INCIDENTAL GASTIC XANTHOMA. - NEGATIVE FOR DYSPLASIA AND MALIGNANCY.  B. COLON MASS, ASCENDING; COLD BIOPSY: - FRAGMENTS OF TUBULOVILLOUS ADENOMA. - NEGATIVE FOR HIGH-GRADE DYSPLASIA AND MALIGNANCY.  Comment: Multiple additional deeper recut levels were examined.  The endoscopic impression of a fungating and partially obstructing 3 cm mass is noted. Although not identified in this sample, the presence of unsampled high-grade dysplasia and/or malignancy cannot be entire ly excluded. Close clinical follow-up is recommended.  GROSS DESCRIPTION: A. Labeled: Cbx gastric body for nodular gastritis rule out lymphoma Received: Formalin Collection time: 12:44 PM on 10/06/2021 Placed into formalin time: 12:44 PM on 10/06/2021 Tissue fragment(s): 3 Size: Aggregate, 1.0 x 0.3 x 0.1 cm Description: Tan soft tissue fragments Entirely submitted in 1 cassette.  B. Labeled: Cbx ascending colon mass Received: Formalin Collection time: 1:01 PM on 10/06/2021 Placed into formalin time: 1:01 PM on 10/06/2021 Tissue fragment(s): Multiple Size: Aggregate, 2.0 x 0.4 x 0.1 cm Description: Tan soft tissue fragments Entirely submitted in 1 cassette.  CM 10/06/2021  Final Diagnosis performed by Dustin Napoleon, MD.   Electronically signed 10/07/2021 2:37:40PM The electronic signature indicates that the named Attending Pathologist has evaluated the specimen Technical component performed at [facility], [address] [place], [place] [address] Lab: [phone] Dir: Worthy Rancher, MD, MMM  Professional component performed at [facility], [facility], [place], [place], [name] [address] Lab: [phone] Dir: Kathi Simpers, MD   Prepare [facility] (crossmatch)     Status: None   Collection Time: 10/06/21  3:00 PM  Result Value Ref Range   Order Confirmation       ORDER PROCESSED BY BLOOD BANK Performed at [facility], [address] [place], [place] [address]   CBC     Status: Abnormal   Collection Time: 10/06/21  3:21 PM  Result Value Ref Range   WBC 5.6 4.0 - 10.5 K/uL   RBC 3.76 (L) 4.22 - 5.81 MIL/uL   Hemoglobin 6.9 (L) 13.0 - 17.0 g/dL    Comment: Reticulocyte Hemoglobin testing may be clinically indicated, consider ordering this additional test [id number]    HCT 25.2 (L) 39.0 - 52.0 %   MCV 67.0 (L) 80.0 - 100.0 fL   MCH 18.4 (L) 26.0 - 34.0 pg   MCHC 27.4 (L) 30.0 - 36.0 g/dL   RDW 24.1 (H) 11.5 - 15.5 %   Platelets 302 150 - 400 K/uL   nRBC 0.0 0.0 - 0.2 %    Comment: Performed at [facility], [address] [place], [place] [address]  CEA     Status: Abnormal   Collection Time: 10/06/21  7:00 PM  Result Value Ref Range   CEA 6.0 (H) 0.0 - 4.7 ng/mL    Comment: (NOTE)  Nonsmokers          <3.9                             Smokers             <5.6 Roche [employer] Electrochemiluminescence Immunoassay (ECLIA) Values obtained with different assay methods or kits cannot be used interchangeably.  Results cannot be interpreted as absolute evidence of the presence or absence of malignant disease. Performed At: [facility] [place], [place] [id number] Rush Farmer MD YB:[id number]   Hemoglobin and hematocrit, blood     Status: Abnormal   Collection Time: 10/06/21 10:43 PM  Result Value Ref Range   Hemoglobin 7.7 (L) 13.0 - 17.0 g/dL   HCT 27.6 (L) 39.0 - 52.0 %    Comment: Performed at [facility], [address] [place], [place] [address]  CBC     Status: Abnormal   Collection Time: 10/07/21  4:12 AM  Result Value Ref Range   WBC 7.5 4.0 - 10.5 K/uL   RBC 4.06 (L) 4.22 - 5.81 MIL/uL   Hemoglobin 7.7 (L) 13.0 - 17.0 g/dL    Comment: Reticulocyte Hemoglobin testing may be clinically indicated, consider ordering this additional test  [id number]    HCT 27.7 (L) 39.0 - 52.0 %   MCV 68.2 (L) 80.0 - 100.0 fL   MCH 19.0 (L) 26.0 - 34.0 pg   MCHC 27.8 (L) 30.0 - 36.0 g/dL   RDW 24.1 (H) 11.5 - 15.5 %   Platelets 309 150 - 400 K/uL   nRBC 0.0 0.0 - 0.2 %    Comment: Performed at [facility], [address], [place], [place] [address]  Basic metabolic panel     Status: Abnormal   Collection Time: 10/07/21  4:12 AM  Result Value Ref Range   Sodium 136 135 - 145 mmol/L   Potassium 4.6 3.5 - 5.1 mmol/L   Chloride 108 98 - 111 mmol/L   CO2 24 22 - 32 mmol/L   Glucose, Bld 110 (H) 70 - 99 mg/dL    Comment: Glucose reference range applies only to samples taken after fasting for at least 8 hours.   BUN 13 8 - 23 mg/dL   Creatinine, Ser 0.82 0.61 - 1.24 mg/dL   Calcium 8.6 (L) 8.9 - 10.3 mg/dL   GFR, Estimated >60 >60 mL/min    Comment: (NOTE) Calculated using the CKD-EPI Creatinine Equation (2021)    Anion gap 4 (L) 5 - 15    Comment: Performed at [facility], [name] [place], [place] [address]  Glucose, capillary     Status: Abnormal   Collection Time: 10/07/21  8:26 AM  Result Value Ref Range   Glucose-Capillary 118 (H) 70 - 99 mg/dL    Comment: Glucose reference range applies only to samples taken after fasting for at least 8 hours.  CBC     Status: Abnormal   Collection Time: 10/08/21  7:13 AM  Result Value Ref Range   WBC 6.8 4.0 - 10.5 K/uL   RBC 4.14 (L) 4.22 - 5.81 MIL/uL   Hemoglobin 7.8 (L) 13.0 - 17.0 g/dL    Comment: Reticulocyte Hemoglobin testing may be clinically indicated, consider ordering this additional test [id number]    HCT 28.9 (L) 39.0 - 52.0 %   MCV 69.8 (L) 80.0 - 100.0 fL   MCH 18.8 (L) 26.0 - 34.0 pg  MCHC 27.0 (L) 30.0 - 36.0 g/dL   RDW 25.7 (H) 11.5 - 15.5 %   Platelets 312 150 - 400 K/uL   nRBC 0.6 (H) 0.0 - 0.2 %    Comment: Performed at [facility], [address] [place], [place] [address]  Retic Panel     Status: Abnormal   Collection  Time: 10/08/21  7:13 AM  Result Value Ref Range   Retic Ct Pct 1.1 0.4 - 3.1 %   RBC. 4.11 (L) 4.22 - 5.81 MIL/uL   Retic Count, Absolute 44.8 19.0 - 186.0 K/uL   Immature Retic Fract 24.8 (H) 2.3 - 15.9 %   Reticulocyte Hemoglobin 22.0 (L) >27.9 pg    Comment:        A RET-He < 28 pg is an indication of iron-deficient or iron- insufficient erythropoiesis. Patients with thalassemia may also have a decreased RET-He result unrelated to iron availability.     If this patient has chronic kidney disease and does not have a hemoglobinopathy he/she meets criteria for iron deficiency per the 2016 NICE guidelines. Refer to specific guidelines to determine the appropriate thresholds for treating CKD- associated iron deficiency. TSAT and ferritin should be used in patients with hemoglobinopathies (e.g. thalassemia). Performed at [facility], [address] [place], [place] [address]   Glucose, capillary     Status: None   Collection Time: 10/08/21  8:01 AM  Result Value Ref Range   Glucose-Capillary 95 70 - 99 mg/dL    Comment: Glucose reference range applies only to samples taken after fasting for at least 8 hours.   Comment 1 Notify RN    Comment 2 Document in Chart   Glucose, capillary     Status: Abnormal   Collection Time: 10/08/21 11:36 AM  Result Value Ref Range   Glucose-Capillary 105 (H) 70 - 99 mg/dL    Comment: Glucose reference range applies only to samples taken after fasting for at least 8 hours.    Current Facility-Administered Medications  Medication Dose Route Frequency Provider Last Rate Last Admin   acetaminophen (TYLENOL) tablet 650 mg  650 mg Oral Q6H PRN Ivor Costa, MD   650 mg at 10/07/21 7124   bismuth subsalicylate (PEPTO BISMOL) chewable tablet 524 mg  524 mg Oral Q6H Croley, Granville M, PA-C       ferrous sulfate tablet 325 mg  325 mg Oral BID WC Ivor Costa, MD   325 mg at 10/08/21 0845   LORazepam (ATIVAN) injection 0.5 mg  0.5 mg  Intravenous Q12H PRN Ivor Costa, MD       metroNIDAZOLE (FLAGYL) tablet 250 mg  250 mg Oral Q6H Croley, Granville M, PA-C       nicotine (NICODERM CQ - dosed in mg/24 hours) patch 21 mg  21 mg Transdermal Daily Ivor Costa, MD   21 mg at 10/08/21 0845   ondansetron (ZOFRAN) injection 4 mg  4 mg Intravenous Q8H PRN Ivor Costa, MD       pantoprazole (PROTONIX) EC tablet 40 mg  40 mg Oral BID Gerarda Gunther M, PA-C   40 mg at 10/08/21 0845   senna (SENOKOT) tablet 8.6 mg  1 tablet Oral Daily PRN Ivor Costa, MD       tetracycline (SUMYCIN) capsule 500 mg  500 mg Oral QID Geanie Kenning, PA-C        Musculoskeletal: Strength & Muscle Tone: decreased Gait & Station:  did not witness Patient leans: N/A  Psychiatric Specialty  Exam: Physical Exam Vitals and nursing note reviewed.  Constitutional:      Appearance: Normal appearance.  HENT:     Head: Normocephalic.     Nose: Nose normal.  Pulmonary:     Effort: Pulmonary effort is normal.  Musculoskeletal:     Cervical back: Normal range of motion.  Neurological:     General: No focal deficit present.     Mental Status: He is alert and oriented to person, place, and time.  Psychiatric:        Attention and Perception: Attention and perception normal.        Mood and Affect: Mood is anxious. Affect is blunt.        Speech: Speech normal.        Behavior: Behavior normal. Behavior is cooperative.        Thought Content: Thought content normal.        Cognition and Memory: Cognition and memory normal.        Judgment: Judgment normal.    Review of Systems  Gastrointestinal:  Positive for abdominal pain.  Psychiatric/Behavioral:  The patient is nervous/anxious.   All other systems reviewed and are negative.  Blood pressure 120/78, pulse (!) 56, temperature (!) 97.4 F (36.3 C), temperature source Oral, resp. rate 18, height _0  (1.702 m), weight 60.7 kg, SpO2 100 %.Body mass index is 20.96 kg/m.  General Appearance: Casual   Eye Contact:  Good  Speech:  Normal Rate  Volume:  Normal  Mood:  Anxious  Affect:  Blunt  Thought Process:  Coherent  Orientation:  Full (Time, Place, and Person)  Thought Content:  WDL and Logical  Suicidal Thoughts:  No  Homicidal Thoughts:  No  Memory:  Immediate;   Fair Recent;   Fair Remote;   Fair  Judgement:  Good  Insight:  Good  Psychomotor Activity:  Decreased  Concentration:  Concentration: Good and Attention Span: Good  Recall:  Good  Fund of Knowledge:  Fair  Language:  Good  Akathisia:  No  Handed:  Right  AIMS (if indicated):     Assets:  Housing Leisure Time Resilience Social Support  ADL's:  Intact  Cognition:  WNL  Sleep:        Physical Exam: Physical Exam Vitals and nursing note reviewed.  Constitutional:      Appearance: Normal appearance.  HENT:     Head: Normocephalic.     Nose: Nose normal.  Pulmonary:     Effort: Pulmonary effort is normal.  Musculoskeletal:     Cervical back: Normal range of motion.  Neurological:     General: No focal deficit present.     Mental Status: He is alert and oriented to person, place, and time.  Psychiatric:        Attention and Perception: Attention and perception normal.        Mood and Affect: Mood is anxious. Affect is blunt.        Speech: Speech normal.        Behavior: Behavior normal. Behavior is cooperative.        Thought Content: Thought content normal.        Cognition and Memory: Cognition and memory normal.        Judgment: Judgment normal.   Review of Systems  Gastrointestinal:  Positive for abdominal pain.  Psychiatric/Behavioral:  The patient is nervous/anxious.   All other systems reviewed and are negative. Blood pressure 120/78, pulse (!) 56, temperature (!) 97.4 F (  36.3 C), temperature source Oral, resp. rate 18, height _0  (1.702 m), weight 60.7 kg, SpO2 100 %. Body mass index is 20.96 kg/m.  Treatment Plan Summary: Schizoaffective disorder, bipolar type: Continue Haldol  dec 100 mg monthly via his Act team Continue Invega Sustenna 234 mg monthly via his ACT team Follow up with Dustin [name]  Disposition: Patient does not meet criteria for psychiatric inpatient admission.  Waylan Boga, NP 10/08/2021 12:11 PM

## 2021-10-08 NOTE — Progress Notes (Signed)
Returned call to patients brother [name] at this time.  Gave updated from what GI had mentioned this am and informed we are waiting for surgery input before determining when he will be discharged. ?

## 2021-10-09 ENCOUNTER — Inpatient Hospital Stay: Payer: Medicare Other

## 2021-10-09 ENCOUNTER — Encounter: Payer: Self-pay | Admitting: Internal Medicine

## 2021-10-09 DIAGNOSIS — R634 Abnormal weight loss: Secondary | ICD-10-CM

## 2021-10-09 DIAGNOSIS — D500 Iron deficiency anemia secondary to blood loss (chronic): Secondary | ICD-10-CM | POA: Diagnosis not present

## 2021-10-09 DIAGNOSIS — K6389 Other specified diseases of intestine: Secondary | ICD-10-CM | POA: Diagnosis not present

## 2021-10-09 DIAGNOSIS — F250 Schizoaffective disorder, bipolar type: Secondary | ICD-10-CM | POA: Diagnosis not present

## 2021-10-09 DIAGNOSIS — D649 Anemia, unspecified: Secondary | ICD-10-CM | POA: Diagnosis not present

## 2021-10-09 LAB — GLUCOSE, CAPILLARY
Glucose-Capillary: 101 mg/dL — ABNORMAL HIGH (ref 70–99)
Glucose-Capillary: 96 mg/dL (ref 70–99)
Glucose-Capillary: 100 mg/dL — ABNORMAL HIGH (ref 70–99)

## 2021-10-09 MED ORDER — IOHEXOL 9 MG/ML PO SOLN
500.0000 mL | ORAL | Status: AC
  Administered 2021-10-09 (×2): 500 mL via ORAL

## 2021-10-09 MED ORDER — IOHEXOL 300 MG/ML  SOLN
100.0000 mL | Freq: Once | INTRAMUSCULAR | Status: AC | PRN
  Administered 2021-10-09: 100 mL via INTRAVENOUS

## 2021-10-09 NOTE — Progress Notes (Signed)
?   10/09/21 2100  ?Clinical Encounter Type  ?Visited With Patient  ?Visit Type Initial  ?Referral From Nurse  ?Consult/Referral To Chaplain  ? ?Chaplain responded to nurse consult. Chaplain provided compassionate presence and reflective listening as patient spoke about hospital stay. Patient moves from rational conversation to confused speech and disorientation. Patient requested a Bible which Chaplain provided. Patient showed Chaplain his writings where he would journal his thoughts. Chaplain provided support for teary conversation at times. Patient appreciated [place] visit.  ?

## 2021-10-09 NOTE — Consult Note (Signed)
Patient ID: Dustin [name], male   DOB: Feb 06, 1956, 66 y.o.   MRN: [id number] ? ?HPI ?Dustin [name] is a 66 y.o. male seen in consultation at the request of Dr. Tasia Catchings and Mr.Croly and Dr. Alice Reichert. ?Initially presented 4 days ago with symptomatic anemia and lower GI bleed.Dustin [name] ?Apparently he had some bleeding when he was wiping after a bowel movement. ?Colonoscopy personally reviewed showing near obstructing mass on the ascending colon measuring 3 x 2 cm, partially obstructed and fungating consistent with malignancy.  Unable to be safely resected endoscopically due to its size he had a final pathology showing tubulovillous adenoma without dysplasia. ?He does have a significant medical history consistent with schizoaffective disorder bipolar disorder and tardive dyskinesia.  He is on long-term use of antipsychotic medications. ?He has had approximately 20 pound weight loss ?Patient himself reports he has seen some bright red blood intermixed in his stools and on tissue paper when wiping. He does not know how long this has been ongoing.  ?On arrival his hemoglobin was 6.2 and he was transfused 1 unit of blood.  Currently no witnessed episode of lower GI bleeds. ?Labs include a normal creatinine normal BMP.  Hemoglobin is 7.8 and platelets is 312 k.  His CEA is 6. ?He is smokes actively. ?He recently underwent a staging as a CT scan of the abdomen pelvis and chest.  Please note that I have personally reviewed them.  There is no evidence of distant metastatic disease. ?He also endorses some intermittent abdominal pain but this is a vague ? ?HPI ? ?Past Medical History:  ?Diagnosis Date  ? Schizophrenia ([place])   ? ? ?Past Surgical History:  ?Procedure Laterality Date  ? COLONOSCOPY N/A 10/06/2021  ? Procedure: COLONOSCOPY;  Surgeon: Toledo, Benay Pike, MD;  Location: [facility] ENDOSCOPY;  Service: Gastroenterology;  Laterality: N/A;  ? ESOPHAGOGASTRODUODENOSCOPY N/A 10/06/2021  ? Procedure: ESOPHAGOGASTRODUODENOSCOPY (EGD);  Surgeon:  Toledo, Benay Pike, MD;  Location: [facility] ENDOSCOPY;  Service: Gastroenterology;  Laterality: N/A;  ? ? ?Family History  ?Problem Relation Age of Onset  ? Lung cancer Father   ? Hypertension Brother   ? Hypertension Brother   ? Hypertension Brother   ? Prostate cancer Brother   ? ? ?Social History ?Social History  ? ?Tobacco Use  ? Smoking status: Every Day  ?  Packs/day: 0.50  ?  Years: 15.00  ?  Pack years: 7.50  ?  Types: Cigarettes  ? Smokeless tobacco: Never  ?Vaping Use  ? Vaping Use: Never used  ?Substance Use Topics  ? Alcohol use: Not Currently  ? Drug use: No  ? ? ?No Known Allergies ? ?Current Facility-Administered Medications  ?Medication Dose Route Frequency Provider Last Rate Last Admin  ? acetaminophen (TYLENOL) tablet 650 mg  650 mg Oral Q6H PRN Ivor Costa, MD   650 mg at 10/07/21 2231  ? bismuth subsalicylate (PEPTO BISMOL) chewable tablet 524 mg  524 mg Oral Q6H Geanie Kenning, PA-C   524 mg at 10/09/21 0847  ? ferrous sulfate tablet 325 mg  325 mg Oral BID WC Ivor Costa, MD   325 mg at 10/09/21 0525  ? LORazepam (ATIVAN) injection 0.5 mg  0.5 mg Intravenous Q12H PRN Ivor Costa, MD      ? metroNIDAZOLE (FLAGYL) tablet 250 mg  250 mg Oral Q6H Croley, Granville M, PA-C   250 mg at 10/09/21 1140  ? nicotine (NICODERM CQ - dosed in mg/24 hours) patch 21 mg  21 mg Transdermal Daily  Ivor Costa, MD   21 mg at 10/09/21 0848  ? ondansetron (ZOFRAN) injection 4 mg  4 mg Intravenous Q8H PRN Ivor Costa, MD      ? pantoprazole (PROTONIX) EC tablet 40 mg  40 mg Oral BID Gerarda Gunther M, PA-C   40 mg at 10/09/21 0847  ? senna (SENOKOT) tablet 8.6 mg  1 tablet Oral Daily PRN Ivor Costa, MD      ? tetracycline (SUMYCIN) capsule 500 mg  500 mg Oral QID Gerarda Gunther M, PA-C   500 mg at 10/09/21 1240  ? ? ? ?Review of Systems ?Full ROS  was asked and was negative except for the information on the HPI ? ?Physical Exam ?Blood pressure 109/74, pulse 67, temperature 98 ?F (36.7 ?C), temperature source Oral,  resp. rate 18, height '5\' 7"'$  (1.702 m), weight 60.7 kg, SpO2 98 %. ?CONSTITUTIONAL: NAD. ?EYES: Pupils are equal, round, and reactive to light, Sclera are non-icteric. ?EARS, NOSE, MOUTH AND THROAT: The oropharynx is clear. The oral mucosa is pink and moist. Hearing is intact to voice. ?LYMPH NODES:  Lymph nodes in the neck are normal. ?RESPIRATORY:  Lungs are clear. There is normal respiratory effort, with equal breath sounds bilaterally, and without pathologic use of accessory muscles. ?CARDIOVASCULAR: Heart is regular without murmurs, gallops, or rubs. ?GI: The abdomen is  soft, mild TTP RLQ w/o peritonitis, and nondistended. There are no palpable masses. There is no hepatosplenomegaly. There are normal bowel sounds  ?GU: Rectal deferred.   ?MUSCULOSKELETAL: Normal muscle strength and tone. No cyanosis or edema.   ?SKIN: Turgor is good and there are no pathologic skin lesions or ulcers. ?NEUROLOGIC: Motor and sensation is grossly normal. Cranial nerves are grossly intact. ?PSYCH:  Oriented to person, place and time. Affect is blunt. ?I had the opportunity to spend a bit of time with him I did notice significant had multiple papers with his handwriting in a disorganized fashion and it was very difficult to understand. ?He requested a bible for his Sanity.  ? ? ?Data Reviewed ? ?I have personally reviewed the patient's imaging, laboratory findings and medical records.   ? ?Assessment/Plan ?66 year old male with unintentional weight loss symptomatic anemia and a near obstructing mass on the ascending colon unable to be safely removed endoscopically.  Given the size of the mass and the suspicious for malignancy and likely culprit for anemia I definitely recommend partial colectomy.  Given significant psychiatric issues I think that is reasonable to perform colectomy while he is in-house.  That way we can optimize him from anemia perspective as well as to perform appropriate staging.  We will tentatively place him in  the schedule either this Wednesday or Thursday pending or availability.  Procedure discussed with the patient in detail and he is in agreement. ?I would like to for his hemoglobin to be above 8.5 or so and we will ask hematology about potential iron Rx ?We will need to get in touch with his power of attorney to obtain appropriate consent ?I spent 75 minutes since encounter including coordination of his care, placing orders, personally reviewing imaging studies and performing appropriate documentation ? ? ?Caroleen Hamman, MD FACS ?General Surgeon ?10/09/2021, 2:20 PM ? ?  ?

## 2021-10-09 NOTE — Progress Notes (Signed)
Mobility Specialist - Progress Note ? ? 10/09/21 1500  ?Mobility  ?Activity Refused mobility  ? ? ?Pt lying supine upon arrival using RA. Pt refuses mobility no reason specified, second attempt this date. Will return at a later date and time. ? ?Merrily Brittle ?Mobility Specialist ?10/09/21, 3:13 PM ? ? ? ?

## 2021-10-09 NOTE — Plan of Care (Signed)
?  Problem: Clinical Measurements: Goal: Ability to maintain clinical measurements within normal limits will improve Outcome: Progressing Goal: Will remain free from infection Outcome: Progressing Goal: Diagnostic test results will improve Outcome: Progressing Goal: Respiratory complications will improve Outcome: Progressing Goal: Cardiovascular complication will be avoided Outcome: Progressing   Problem: Pain Managment: Goal: General experience of comfort will improve Outcome: Progressing   

## 2021-10-09 NOTE — Progress Notes (Signed)
? ?Hematology/Oncology Progress note ?[phone] [phone] ?  ? ? ?Patient Care Team: ?Jonetta Osgood, NP as PCP - General (Nurse Practitioner)  ? ?Name of the patient: Dustin [name]  ?[id number]  ?11-15-1955  ?Date of visit: 10/09/21 ? ? ?INTERVAL HISTORY-  ? ?10/06/21 stomach biopsy showed chronic active gastritis with H Pylori, incidental gastric xanthoma.  ?Colon mass biopsy showed fragments of tubulovillous adenoma. Negative for high grade dyplasia and malignancy.  ? ?Patient is lying in bed taking a nap. He voices no new complaints.  ?No acute overnight events. Denies any abdominal pain.  ? ? ? ? ? ?Current Facility-Administered Medications:  ?  acetaminophen (TYLENOL) tablet 650 mg, 650 mg, Oral, Q6H PRN, Ivor Costa, MD, 650 mg at 10/07/21 2231 ?  bismuth subsalicylate (PEPTO BISMOL) chewable tablet 524 mg, 524 mg, Oral, Q6H, Croley, Granville M, PA-C, 524 mg at 10/09/21 0847 ?  ferrous sulfate tablet 325 mg, 325 mg, Oral, BID WC, Ivor Costa, MD, 325 mg at 10/09/21 0525 ?  LORazepam (ATIVAN) injection 0.5 mg, 0.5 mg, Intravenous, Q12H PRN, Ivor Costa, MD ?  metroNIDAZOLE (FLAGYL) tablet 250 mg, 250 mg, Oral, Q6H, Croley, Granville M, PA-C, 250 mg at 10/09/21 9163 ?  nicotine (NICODERM CQ - dosed in mg/24 hours) patch 21 mg, 21 mg, Transdermal, Daily, Ivor Costa, MD, 21 mg at 10/09/21 0848 ?  ondansetron (ZOFRAN) injection 4 mg, 4 mg, Intravenous, Q8H PRN, Ivor Costa, MD ?  pantoprazole (PROTONIX) EC tablet 40 mg, 40 mg, Oral, BID, Croley, Granville M, PA-C, 40 mg at 10/09/21 0847 ?  senna (SENOKOT) tablet 8.6 mg, 1 tablet, Oral, Daily PRN, Ivor Costa, MD ?  tetracycline (SUMYCIN) capsule 500 mg, 500 mg, Oral, QID, Croley, Granville M, PA-C, 500 mg at 10/09/21 8466 ? ? ?Physical exam:  ?Vitals:  ? 10/08/21 1557 10/08/21 2049 10/09/21 0414 10/09/21 0837  ?BP: (!) 95/59 95/60 (!) 96/54 109/74  ?Pulse: 68 64 78 67  ?Resp: '16 18 18 18  '$ ?Temp: 97.8 ?F (36.6 ?C) 98.4 ?F (36.9 ?C) 98.5 ?F (36.9  ?C) 98 ?F (36.7 ?C)  ?TempSrc: Oral Oral  Oral  ?SpO2: 100% 100% 100% 98%  ?Weight:      ?Height:      ? ?Physical Exam ?Constitutional:   ?   General: He is not in acute distress. ?   Appearance: He is not diaphoretic.  ?HENT:  ?   Head: Normocephalic and atraumatic.  ?   Mouth/Throat:  ?   Pharynx: No oropharyngeal exudate.  ?Eyes:  ?   General: No scleral icterus. ?Cardiovascular:  ?   Rate and Rhythm: Normal rate and regular rhythm.  ?   Heart sounds: No murmur heard. ?Pulmonary:  ?   Effort: Pulmonary effort is normal. No respiratory distress.  ?   Breath sounds: No rales.  ?Chest:  ?   Chest wall: No tenderness.  ?Abdominal:  ?   General: There is no distension.  ?   Palpations: Abdomen is soft.  ?Musculoskeletal:     ?   General: Normal range of motion.  ?   Cervical back: Normal range of motion and neck supple.  ?Skin: ?   General: Skin is warm and dry.  ?   Findings: No erythema.  ?Neurological:  ?   Mental Status: He is alert. Mental status is at baseline.  ?   Cranial Nerves: No cranial nerve deficit.  ?   Motor: No abnormal muscle tone.  ?Psychiatric:     ?  Mood and Affect: Affect normal.  ?  ? ? ? ? ?  Latest Ref Rng & Units 10/07/2021  ?  4:12 AM  ?CMP  ?Glucose 70 - 99 mg/dL 110    ?BUN 8 - 23 mg/dL 13    ?Creatinine 0.61 - 1.24 mg/dL 0.82    ?Sodium 135 - 145 mmol/L 136    ?Potassium 3.5 - 5.1 mmol/L 4.6    ?Chloride 98 - 111 mmol/L 108    ?CO2 22 - 32 mmol/L 24    ?Calcium 8.9 - 10.3 mg/dL 8.6    ? ? ?  Latest Ref Rng & Units 10/08/2021  ?  7:13 AM  ?CBC  ?WBC 4.0 - 10.5 K/uL 6.8    ?Hemoglobin 13.0 - 17.0 g/dL 7.8    ?Hematocrit 39.0 - 52.0 % 28.9    ?Platelets 150 - 400 K/uL 312    ? ? ?RADIOGRAPHIC STUDIES: ?I have personally reviewed the radiological images as listed and agreed with the findings in the report. ?DG Chest 2 View ? ?Result Date: 10/03/2021 ?CLINICAL DATA:  Unintentional weight loss, tobacco abuse EXAM: CHEST - 2 VIEW COMPARISON:  None. FINDINGS: Frontal and lateral views of the chest  demonstrate an unremarkable cardiac silhouette. The lungs are hyperinflated with background interstitial prominence consistent with emphysema. No airspace disease, effusion, or pneumothorax. There are no acute bony abnormalities. IMPRESSION: 1. Emphysema.  No acute intrathoracic process. Electronically Signed   By: Randa Ngo M.D.   On: 10/03/2021 17:17  ? ?CT HEAD WO CONTRAST (5MM) ? ?Result Date: 10/05/2021 ?CLINICAL DATA:  Altered mental status EXAM: CT HEAD WITHOUT CONTRAST TECHNIQUE: Contiguous axial images were obtained from the base of the skull through the vertex without intravenous contrast. RADIATION DOSE REDUCTION: This exam was performed according to the departmental dose-optimization program which includes automated exposure control, adjustment of the mA and/or kV according to patient size and/or use of iterative reconstruction technique. COMPARISON:  None Available. FINDINGS: Brain: No acute territorial infarction, hemorrhage or intracranial mass. Mild atrophy. Nonenlarged ventricles. Vascular: No hyperdense vessels.  Carotid vascular calcification. Skull: Normal. Negative for fracture or focal lesion. Small defects within the parietal bones posteriorly. Sinuses/Orbits: No acute finding. Other: None IMPRESSION: 1. No CT evidence for acute intracranial abnormality. 2. Mild atrophy. Electronically Signed   By: Donavan Foil M.D.   On: 10/05/2021 17:44   ? ?Assessment and plan-  ? ?# # Iron deficiency anemia, likely due to chronic GI blood loss.  ?Hb is 7.8 ?S/p 1 dose of Feraheme on 5/3, PRBC transfusion.  ?Continue oral ferrous sulfate BID w meals. check CBC in AM.  ?  ?# ascending colon mass, likely malignancy. CEA is elevated.  ?Pathology showed adenoma. Still highly suspicious for underlying malignancy. Recommend surgery evaluation.  ?Check staging images with CT chest abdomen pelvis.  ? ?# unintentional weight loss, pending above work up.  ? ?Discussed with Dr.Pabon ?Thank you for allowing me to  participate in the care of this patient.  ? ?Earlie Server, MD, PhD ?Hematology Oncology ? ?10/09/2021  ?

## 2021-10-09 NOTE — Progress Notes (Signed)
[facility] Progress Note ? ?Patient: Dustin [name]    SPQ:[id number]  DOA: 10/05/2021    ?Date of Service: the patient was seen and examined on 10/09/2021 ? ?Brief hospital course: ?66 year old male with past medical history of tobacco abuse, schizophrenia and tardive dyskinesia who presented to the emergency room after being redirected there by his PCP.  2 days prior, blood work came back with a hemoglobin of 6.2 and an MCV of 64.  In addition, patient notes a 15 to 20 pound weight loss over the last 6 months.  Patient also relates a 1 week history of pharyngeal esophageal dysphagia.  Patient was transfused 1 unit packed red blood cells and brought in for further evaluation to the hospitalist service.  GI was consulted and took patient for EGD and colonoscopy on 5/4.  Endoscopy unremarkable, but colonoscopy revealed incidental diverticula and a partially obstructing colonic mass in the ascending colon.  Interestingly, biopsies note tubulovillous adenoma and not adenocarcinoma of the colon.  General surgery has been consulted for colonic mass removal.  Psychiatry consulted for psychosis behavior. ? ?Assessment and Plan: ?Assessment and Plan: ?* Colonic mass ?CEA level only at 6.  Biopsy samples on the note tubulovillous adenoma and not for adenocarcinoma of colon.  Cannot fully rule out malignancy without removing mass concerning for pathology.  General surgery consulted and plans to take patient to the OR on Thursday, 5/11. ? ?Microcytic anemia ?Patient already status post 1 unit transfusion.  We will transfuse another unit.  Cause is colonic mass and slow bleed over time.  Also received iron.  Hemoglobin stable around 7.7-7.8. ? ? ?Schizoaffective disorder, bipolar type ([place]) ?Pt is getting Invega and Holdal injection ?-prn ativan 0.5 mg bid IV ?Patient appeared to have more psychotic behavior on 5/5 although more appropriate since then.  Psychiatry following. ? ?Tobacco use disorder ?-nicotine  patch ? ?Iron deficiency anemia due to chronic blood loss ?Also received iron transfusion ? ?Syncope ?Patient cannot provide detailed information.  CT head negative.  No focal neuro deficit on physical examination today.  May be due to volume depletion secondary to GI bleeding ?-Frequent neurochecks ?-check orthostatic vitals ? ? ? ? ? ? ? ?Body mass index is 20.96 kg/m?.  ?  ?   ? ?Consultants: ?Gastroenterology ?General surgery ?Psychiatry ? ?Procedures: ?Status post 1 unit packed red blood cell transfusion x2 ?EGD done 5/4: Unremarkable ?Colonoscopy done 5/4: Partial obstructing ascending colonic mass, incidental diverticula ?Planned colonic mass resection 5/11 ? ?Antimicrobials: ?None ? ?Code Status: Full code ? ? ?Subjective: Patient with no complaints ? ?Objective: ?Noted bradycardia, likely residual anesthesia ?Vitals:  ? 10/09/21 0414 10/09/21 0837  ?BP: (!) 96/54 109/74  ?Pulse: 78 67  ?Resp: 18 18  ?Temp: 98.5 ?F (36.9 ?C) 98 ?F (36.7 ?C)  ?SpO2: 100% 98%  ? ? ?Intake/Output Summary (Last 24 hours) at 10/09/2021 1403 ?Last data filed at 10/09/2021 1300 ?Gross per 24 hour  ?Intake 720 ml  ?Output 1460 ml  ?Net -740 ml  ? ?Filed Weights  ? 10/05/21 1759  ?Weight: 60.7 kg  ? ?Body mass index is 20.96 kg/m?. ? ?Exam: ? ?General: Alert and oriented x2, no acute distress ?HEENT: Normocephalic and atraumatic, mucous membranes are slightly dry ?Cardiovascular: Regular rhythm, borderline bradycardia ?Respiratory: Clear to auscultation bilaterally ?Abdomen: Soft, nontender, nondistended, positive bowel sounds ?Musculoskeletal: No clubbing or cyanosis or edema ?Skin: No skin breaks, tears or lesions ?Psychiatry: Flattened affect, appropriate today ?Neurology: No focal deficits ? ?Data Reviewed: ?No new  labs today. ? ?Disposition:  ?Status is: Inpatient ?Remains inpatient appropriate because: Plan for surgery 5/11 ? ?Anticipated discharge date: 5/13 ? ?Remaining issues to be resolved so that patient can be discharged:  Surgery and recovery ? ? ?Family Communication: Updated brother by phone ?DVT Prophylaxis: ?SCDs Start: 10/05/21 1419 ? ? ? ?Author: ?Annita Brod ,MD ?10/09/2021 2:03 PM ? ?To reach On-call, see care teams to locate the attending and reach out via [email or web address]. ?Between 7PM-7AM, please contact night-coverage ?If you still have difficulty reaching the attending provider, please page the [facility] (Director on Call) for [facility] on amion for assistance. ? ?

## 2021-10-10 DIAGNOSIS — D500 Iron deficiency anemia secondary to blood loss (chronic): Secondary | ICD-10-CM | POA: Diagnosis not present

## 2021-10-10 DIAGNOSIS — K6389 Other specified diseases of intestine: Secondary | ICD-10-CM | POA: Diagnosis not present

## 2021-10-10 DIAGNOSIS — D649 Anemia, unspecified: Secondary | ICD-10-CM | POA: Diagnosis not present

## 2021-10-10 DIAGNOSIS — R634 Abnormal weight loss: Secondary | ICD-10-CM | POA: Diagnosis not present

## 2021-10-10 DIAGNOSIS — F250 Schizoaffective disorder, bipolar type: Secondary | ICD-10-CM | POA: Diagnosis not present

## 2021-10-10 LAB — CBC
HCT: 31.3 % — ABNORMAL LOW (ref 39.0–52.0)
Hemoglobin: 8.6 g/dL — ABNORMAL LOW (ref 13.0–17.0)
MCH: 20.1 pg — ABNORMAL LOW (ref 26.0–34.0)
MCHC: 27.5 g/dL — ABNORMAL LOW (ref 30.0–36.0)
MCV: 73.1 fL — ABNORMAL LOW (ref 80.0–100.0)
Platelets: 324 10*3/uL (ref 150–400)
RBC: 4.28 MIL/uL (ref 4.22–5.81)
RDW: 29.2 % — ABNORMAL HIGH (ref 11.5–15.5)
WBC: 8.5 10*3/uL (ref 4.0–10.5)
nRBC: 0.2 % (ref 0.0–0.2)

## 2021-10-10 LAB — BASIC METABOLIC PANEL
Anion gap: 5 (ref 5–15)
BUN: 19 mg/dL (ref 8–23)
CO2: 29 mmol/L (ref 22–32)
Calcium: 8.9 mg/dL (ref 8.9–10.3)
Chloride: 107 mmol/L (ref 98–111)
Creatinine, Ser: 1.09 mg/dL (ref 0.61–1.24)
GFR, Estimated: >60 mL/min (ref >60)
Glucose, Bld: 92 mg/dL (ref 70–99)
Potassium: 4.5 mmol/L (ref 3.5–5.1)
Sodium: 141 mmol/L (ref 135–145)

## 2021-10-10 LAB — GLUCOSE, CAPILLARY: Glucose-Capillary: 102 mg/dL — ABNORMAL HIGH (ref 70–99)

## 2021-10-10 NOTE — Progress Notes (Addendum)
2Mobility Specialist - Progress Note ? ? ? 10/10/21 1151  ?Mobility  ?Activity Ambulated with assistance in hallway;Stood at bedside;Dangled on edge of bed  ?Level of Assistance Modified independent, requires aide device or extra time  ?Assistive Device Front wheel walker  ?Distance Ambulated (ft) 80 ft  ?Activity Response Tolerated well  ?$Mobility charge 1 Mobility  ? ? ?Pt in bed upon arrival using RA. Completes bed mobility ModI and STS MinA. Ambulates 273f with ModI + vc for correct RW use.  [facility] ? ?MMerrily Brittle?Mobility Specialist ?10/10/21, 11:54 AM ? ? ? ? ?

## 2021-10-10 NOTE — Progress Notes (Signed)
[facility] Progress Note ? ?Patient: Dustin [name]    JJH:[id number]  DOA: 10/05/2021    ?Date of Service: the patient was seen and examined on 10/10/2021 ? ?Brief hospital course: ?66 year old male with past medical history of tobacco abuse, schizophrenia and tardive dyskinesia who presented to the emergency room after being redirected there by his PCP.  2 days prior, blood work came back with a hemoglobin of 6.2 and an MCV of 64.  In addition, patient notes a 15 to 20 pound weight loss over the last 6 months.  Patient also relates a 1 week history of pharyngeal esophageal dysphagia.  Patient was transfused 1 unit packed red blood cells and brought in for further evaluation to the hospitalist service.  GI was consulted and took patient for EGD and colonoscopy on 5/4.  Endoscopy unremarkable, but colonoscopy revealed incidental diverticula and a partially obstructing colonic mass in the ascending colon.  Interestingly, biopsies note tubulovillous adenoma and not adenocarcinoma of the colon.  General surgery consulted and plans to take patient for removal of colon mass on Thursday, 5/11.  Psychiatry consulted for psychosis behavior. ? ?Assessment and Plan: ?Assessment and Plan: ?* Colonic mass ?CEA level only at 6.  Biopsy samples on the note tubulovillous adenoma and not for adenocarcinoma of colon.  Cannot fully rule out malignancy without removing mass concerning for pathology.  General surgery consulted and plans to take patient to the OR on Thursday, 5/11. ? ?Microcytic anemia ?Patient already status post 1 unit transfusion.  We will transfuse another unit.  Cause is colonic mass and slow bleed over time.  Also received iron.  Hemoglobin stable, today at 8.6. ? ? ?Schizoaffective disorder, bipolar type ([place]) ?Pt is getting Invega and Holdal injection ?-prn ativan 0.5 mg bid IV ?Patient appeared to have more psychotic behavior on 5/5 although more appropriate since then.  Psychiatry following. ? ?Tobacco  use disorder ?-nicotine patch ? ?Iron deficiency anemia due to chronic blood loss ?Also received iron transfusion, and MCV improving. ? ?Syncope ?Patient cannot provide detailed information.  CT head negative.  No focal neuro deficit on physical examination today.  May be due to volume depletion secondary to GI bleeding ?-Frequent neurochecks ?-check orthostatic vitals ? ? ? ? ? ? ? ?Body mass index is 20.96 kg/m?.  ?  ?   ? ?Consultants: ?Gastroenterology ?General surgery ?Psychiatry ? ?Procedures: ?Status post 1 unit packed red blood cell transfusion x2 ?EGD done 5/4: Unremarkable ?Colonoscopy done 5/4: Partial obstructing ascending colonic mass, incidental diverticula ?Planned colonic mass resection 5/11 ? ?Antimicrobials: ?None ? ?Code Status: Full code ? ? ?Subjective: Patient with no complaints ? ?Objective: ?Noted bradycardia, likely residual anesthesia ?Vitals:  ? 10/10/21 0508 10/10/21 0759  ?BP: 93/68 101/74  ?Pulse: 78 60  ?Resp: 18 18  ?Temp: 97.9 ?F (36.6 ?C) 97.8 ?F (36.6 ?C)  ?SpO2: 100% 100%  ? ? ?Intake/Output Summary (Last 24 hours) at 10/10/2021 1548 ?Last data filed at 10/10/2021 1032 ?Gross per 24 hour  ?Intake 1280 ml  ?Output 2650 ml  ?Net -1370 ml  ? ? ?Filed Weights  ? 10/05/21 1759  ?Weight: 60.7 kg  ? ?Body mass index is 20.96 kg/m?. ? ?Exam: ? ?General: Alert and oriented x2, no acute distress ?HEENT: Normocephalic and atraumatic, mucous membranes are slightly dry ?Cardiovascular: Regular rhythm, borderline bradycardia ?Respiratory: Clear to auscultation bilaterally ?Abdomen: Soft, nontender, nondistended, positive bowel sounds ?Musculoskeletal: No clubbing or cyanosis or edema ?Skin: No skin breaks, tears or lesions ?Psychiatry: Flattened affect,  stays appropriate ?Neurology: No focal deficits ? ?Data Reviewed: ?No new labs today. ? ?Disposition:  ?Status is: Inpatient ?Remains inpatient appropriate because: Plan for surgery 5/11 ? ?Anticipated discharge date: 5/13 ? ?Remaining issues to be  resolved so that patient can be discharged: Surgery and recovery ? ? ?Family Communication: Updated brother by phone on 5/6 ?DVT Prophylaxis: ?SCDs Start: 10/05/21 1419 ? ? ? ?Author: ?Annita Brod ,MD ?10/10/2021 3:48 PM ? ?To reach On-call, see care teams to locate the attending and reach out via [email or web address]. ?Between 7PM-7AM, please contact night-coverage ?If you still have difficulty reaching the attending provider, please page the [facility] (Director on Call) for [facility] on amion for assistance. ? ?

## 2021-10-10 NOTE — Progress Notes (Signed)
CC: colon mass ?Subjective: ?No complaints, taking po , no hematochezia, melena . ?AVSS ?CT pers. Reviewed no evidence of mets ? ?Objective: ?Vital signs in last 24 hours: ?Temp:  [97.8 ?F (36.6 ?C)-98.5 ?F (36.9 ?C)] 97.8 ?F (36.6 ?C) (05/08 0759) ?Pulse Rate:  [60-117] 60 (05/08 0759) ?Resp:  [18-20] 18 (05/08 0759) ?BP: (93-104)/(56-74) 101/74 (05/08 0759) ?SpO2:  [100 %] 100 % (05/08 0759) ?Last BM Date : 10/09/21 ? ?Intake/Output from previous day: ?05/07 0701 - 05/08 0700 ?In: 1160 [P.O.:1160] ?Out: 3550 [WVPXT:0626] ?Intake/Output this shift: ?Total I/O ?In: 480 [P.O.:480] ?Out: -  ? ?Physical exam: ?NAD alert, no positive sxs, he is appreciative that I got him a bible ?Abd: soft, nt, no peritonitis ?Ext; no edema ? ?Lab Results: ?CBC  ?Recent Labs  ?  10/08/21 ?0713 10/10/21 ?0446  ?WBC 6.8 8.5  ?HGB 7.8* 8.6*  ?HCT 28.9* 31.3*  ?PLT 312 324  ? ?BMET ?Recent Labs  ?  10/10/21 ?0446  ?NA 141  ?K 4.5  ?CL 107  ?CO2 29  ?GLUCOSE 92  ?BUN 19  ?CREATININE 1.09  ?CALCIUM 8.9  ? ?PT/INR ?No results for input(s): LABPROT, INR in the last 72 hours. ?ABG ?No results for input(s): PHART, HCO3 in the last 72 hours. ? ?Invalid input(s): PCO2, PO2 ? ?Studies/Results: ?CT CHEST ABDOMEN PELVIS W CONTRAST ? ?Result Date: 10/09/2021 ?CLINICAL DATA:  66 year old male with unintended weight loss and recent colonoscopy demonstrating a partially obstructing ascending colonic mass with biopsies demonstrating tubular villous adenoma. EXAM: CT CHEST, ABDOMEN, AND PELVIS WITH CONTRAST TECHNIQUE: Multidetector CT imaging of the chest, abdomen and pelvis was performed following the standard protocol during bolus administration of intravenous contrast. RADIATION DOSE REDUCTION: This exam was performed according to the departmental dose-optimization program which includes automated exposure control, adjustment of the mA and/or kV according to patient size and/or use of iterative reconstruction technique. CONTRAST:  186m OMNIPAQUE IOHEXOL  300 MG/ML  SOLN COMPARISON:  06/14/2005 abdomen and pelvis CT FINDINGS: CT CHEST FINDINGS Cardiovascular: Heart size is normal. Heavy coronary artery atherosclerotic calcifications are present. Mild aortic atherosclerotic calcifications noted without thoracic aortic aneurysm. No pericardial effusion is identified. Mediastinum/Nodes: No enlarged mediastinal, hilar, or axillary lymph nodes. Thyroid gland, trachea, and esophagus demonstrate no significant findings. Lungs/Pleura: Moderate emphysema is noted, primarily centrilobular in greatest in the UPPER lungs. There is no evidence of airspace disease, mass, suspicious nodule, consolidation, pleural effusion or pneumothorax. Mild dependent opacities are noted, greatest in the LOWER lobes and LEFT greater than RIGHT, question atelectasis versus interstitial lung disease. Musculoskeletal: No acute or suspicious bony abnormalities are noted. CT ABDOMEN PELVIS FINDINGS Hepatobiliary: The liver and gallbladder are unremarkable. There is no evidence of intrahepatic or extrahepatic biliary dilatation. Pancreas: Unremarkable Spleen: Unremarkable Adrenals/Urinary Tract: Nonobstructing bilateral renal calculi are identified, mostly punctate. The largest calculus measuring 4 mm in the mid LEFT kidney. There is no evidence of hydronephrosis or renal mass. The adrenal glands and bladder are unremarkable. Stomach/Bowel: A moderate to large amount of stool throughout the colon is noted. There is a suggestion of soft tissue fullness measuring approximately 5.5 cm within the UPPER ascending colon which may represent the mass identified on colonoscopy. There is however limited evaluation from stool burden. The remainder of the bowel is unremarkable. There is no evidence of bowel obstruction. The stomach and appendix appear normal. Vascular/Lymphatic: A 3.4 cm fusiform infrarenal abdominal aortic aneurysm is now noted. Aortic atherosclerotic calcifications are present. No enlarged lymph  nodes are identified. Reproductive: Prostate  is unremarkable. Other: A trace amount of free pelvic fluid is noted. There is no evidence of pneumoperitoneum or focal collection. Musculoskeletal: No acute or suspicious bony abnormalities are identified. Degenerative disc disease/mild spondylosis at L5-S1 noted. IMPRESSION: 1. Suggestion of 5.4 cm soft tissue fullness within the UPPER ascending colon which may represent the mass identified on colonoscopy, but limited evaluation due to large stool burden. No evidence of abnormal lymph nodes or metastatic disease. 2. 3.4 cm infrarenal abdominal aortic aneurysm. Recommend follow-up ultrasound every 3 years. This recommendation follows ACR consensus guidelines: White Paper of the ACR Incidental Findings Committee II on Vascular Findings. J Am Coll Radiol 2013; 10:789-794. 3. Mild dependent opacities within the lungs, greatest in the LOWER lobes and LEFT greater than RIGHT, question atelectasis versus interstitial lung disease. 4. Nonobstructing bilateral renal calculi. 5. Heavy coronary artery disease. 6. Aortic Atherosclerosis (ICD10-I70.0) and Emphysema (ICD10-J43.9). Electronically Signed   By: Margarette Canada M.D.   On: 10/09/2021 15:34   ? ?Anti-infectives: ?Anti-infectives (From admission, onward)  ? ? Start     Dose/Rate Route Frequency Ordered Stop  ? 10/08/21 1200  metroNIDAZOLE (FLAGYL) tablet 250 mg       ? 250 mg Oral Every 6 hours 10/08/21 0941 10/22/21 1159  ? 10/08/21 1030  tetracycline (SUMYCIN) capsule 500 mg       ? 500 mg Oral 4 times daily 10/08/21 0941 10/22/21 0959  ? ?  ? ? ?Assessment/Plan: ? ?Ascending colon mass scheduled for Lap R colectomy this Thursday ?Discussed with the brother in detail about the operation.  Risk, benefits and possible implications including but not limited to: Bleeding, infection anastomotic leak reintervention apprehend pathology to cancer.  He understands and is in agreement to proceed. ?Bowel prep on Wednesday ?We will  recheck another hemoglobin on Wednesday as I do want it to be above 8.5 before surgery ?I spent 35 minutes since encounter including coordination of his care, placing orders, personally reviewing imaging studies and performing appropriate documentation ? ?Caroleen Hamman, MD, FACS ? ?10/10/2021 ? ? ? ?  ?

## 2021-10-11 DIAGNOSIS — F250 Schizoaffective disorder, bipolar type: Secondary | ICD-10-CM | POA: Diagnosis not present

## 2021-10-11 DIAGNOSIS — K6389 Other specified diseases of intestine: Secondary | ICD-10-CM | POA: Diagnosis not present

## 2021-10-11 DIAGNOSIS — F17200 Nicotine dependence, unspecified, uncomplicated: Secondary | ICD-10-CM | POA: Diagnosis not present

## 2021-10-11 DIAGNOSIS — D509 Iron deficiency anemia, unspecified: Secondary | ICD-10-CM | POA: Diagnosis not present

## 2021-10-11 LAB — GLUCOSE, CAPILLARY: Glucose-Capillary: 118 mg/dL — ABNORMAL HIGH (ref 70–99)

## 2021-10-11 MED ORDER — PEG 3350-KCL-NA BICARB-NACL 420 G PO SOLR
4000.0000 mL | Freq: Once | ORAL | Status: AC
  Administered 2021-10-12: 4000 mL via ORAL
  Filled 2021-10-11: qty 4000

## 2021-10-11 MED ORDER — SODIUM CHLORIDE 0.9 % IV SOLN
2.0000 g | Freq: Once | INTRAVENOUS | Status: AC
  Administered 2021-10-13: 2 g via INTRAVENOUS
  Filled 2021-10-11: qty 2

## 2021-10-11 NOTE — Progress Notes (Signed)
?Progress Note ? ? ?Patient: Dustin [name] QIO:[id number] DOB: 1955-11-20 DOA: 10/05/2021     5 ?DOS: the patient was seen and examined on 10/11/2021 ?  ?Brief hospital course: ?66 year old male with past medical history of tobacco abuse, schizophrenia and tardive dyskinesia who presented to the emergency room after being redirected there by his PCP.  2 days prior, blood work came back with a hemoglobin of 6.2 and an MCV of 64.  In addition, patient notes a 15 to 20 pound weight loss over the last 6 months.  Patient also relates a 1 week history of pharyngeal esophageal dysphagia.  Patient was transfused 1 unit packed red blood cells and brought in for further evaluation to the hospitalist service.  GI was consulted and took patient for EGD and colonoscopy on 5/4.  Endoscopy unremarkable, but colonoscopy revealed incidental diverticula and a partially obstructing colonic mass in the ascending colon.  Interestingly, biopsies note tubulovillous adenoma and not adenocarcinoma of the colon.  General surgery consulted and plans to take patient for removal of colon mass on Thursday, 5/11.  Psychiatry consulted for psychosis behavior. ? ?5/9: wants to eat, surgery on 5/11 ? ? ?Assessment and Plan: ?* Colonic mass ?CEA level only at 6.  Biopsy samples on the note tubulovillous adenoma and not for adenocarcinoma of colon.  Cannot fully rule out malignancy without removing mass concerning for pathology.  General surgery consulted and plans to take patient to the OR on Thursday, 5/11 ? ?Microcytic anemia ?Status post 2 PRBC transfusion thus far cause is colonic mass and slow bleed over time.  Also received iron.  Hemoglobin stable at 8.6.  Surgeon would like to keep it above 8.5 before surgery ? ? ?Schizoaffective disorder, bipolar type ([place]) ?Pt is getting Invega and Holdal injection ?-prn ativan 0.5 mg bid IV ?Patient appeared to have more psychotic behavior on 5/5 although more appropriate since then.  Psychiatry  following ? ?Tobacco use disorder ?Continue nicotine patch ? ?Iron deficiency anemia due to chronic blood loss ?Also received iron transfusion, and MCV improving ? ?Syncope ?Patient cannot provide detailed information.  CT head negative.  No focal neuro deficit on physical examination.  May be due to volume depletion/anemia secondary to GI bleeding ?-No further syncope or dizzy spells while in the hospital ? ? ? ? ? ?  ? ?Subjective: Wants to eat ? ?Physical Exam: ?Vitals:  ? 10/10/21 0759 10/10/21 2117 10/11/21 0508 10/11/21 0823  ?BP: 101/74 113/71 104/74 96/65  ?Pulse: 60 70 77 64  ?Resp: '18 20 20 15  '$ ?Temp: 97.8 ?F (36.6 ?C) 98.9 ?F (37.2 ?C) 98.4 ?F (36.9 ?C) 98.1 ?F (36.7 ?C)  ?TempSrc: Oral Oral Oral Oral  ?SpO2: 100% 100% 100% 100%  ?Weight:      ?Height:      ? ?? General: Alert and oriented x2, no acute distress ?? HEENT: Normocephalic and atraumatic, mucous membranes are slightly dry ?? Cardiovascular: Regular rhythm, borderline bradycardia ?? Respiratory: Clear to auscultation bilaterally ?? Abdomen: Soft, nontender, nondistended, positive bowel sounds ?? Musculoskeletal: No clubbing or cyanosis or edema ?? Skin: No skin breaks, tears or lesions ?? Psychiatry: Flattened affect, stays appropriate ?? Neurology: No focal deficits ? ?Data Reviewed: ? ?Hb 8.6 ? ?Family Communication: none ? ?Disposition: ?Status is: Inpatient ?Remains inpatient appropriate because: surgery 5/11 ? ? Planned Discharge Destination: Home with Home Health ? ? ? DVT prophylaxis- SCDs ?Time spent: 35 minutes ? ?Author: Max Sane, MD ?10/11/2021 2:24 PM ? ?For on call review  [email or web address]  ?

## 2021-10-11 NOTE — Progress Notes (Signed)
[facility] ?SURGICAL PROGRESS NOTE (cpt [phone]) ? ?Hospital Day(s): 5.  ? ?Interval History: Patient seen and examined, no acute events or new complaints overnight. Patient reports he is doing well. Some abdominal pain overnight but this is resolved. No fever, chills, nausea, emesis. He I snot the most reliable historian secondary to history of significant psychiatric issues. No new labs this morning. Plan for repeat Hgb tomorrow; goal >8.5 in anticipation of surgery. He is on regular diet; having bowel function. He reports these were black.   ? ?Review of Systems:  ?Unable to reliably preform secondary to psychiatric history ? ?Vital signs in last 24 hours: [min-max] current  ?Temp:  [97.8 ?F (36.6 ?C)-98.9 ?F (37.2 ?C)] 98.4 ?F (36.9 ?C) (05/09 9798) ?Pulse Rate:  [60-77] 77 (05/09 0508) ?Resp:  [18-20] 20 (05/09 0508) ?BP: (101-113)/(71-74) 104/74 (05/09 9211) ?SpO2:  [100 %] 100 % (05/09 0508)     Height: '5\' 7"'$  (170.2 cm) Weight: 60.7 kg BMI (Calculated): 20.95  ? ?Intake/Output last 2 shifts:  ?05/08 0701 - 05/09 0700 ?In: 840 [P.O.:840] ?Out: 9417 [Urine:1850; Stool:1]  ? ?Physical Exam:  ?Constitutional: alert, cooperative and no distress  ?HENT: normocephalic without obvious abnormality  ?Eyes: PERRL, EOM's grossly intact and symmetric  ?Respiratory: breathing non-labored at rest  ?Cardiovascular: regular rate and sinus rhythm  ?Gastrointestinal: soft, non-tender, and non-distended, no rebound/guarding ?Musculoskeletal: no edema or wounds, motor and sensation grossly intact, NT  ? ? ?Labs:  ? ?  Latest Ref Rng & Units 10/10/2021  ?  4:46 AM 10/08/2021  ?  7:13 AM 10/07/2021  ?  4:12 AM  ?CBC  ?WBC 4.0 - 10.5 K/uL 8.5   6.8   7.5    ?Hemoglobin 13.0 - 17.0 g/dL 8.6   7.8   7.7    ?Hematocrit 39.0 - 52.0 % 31.3   28.9   27.7    ?Platelets 150 - 400 K/uL 324   312   309    ? ? ?  Latest Ref Rng & Units 10/10/2021  ?  4:46 AM 10/07/2021  ?  4:12 AM 10/05/2021  ?  2:10 PM  ?CMP  ?Glucose 70 - 99 mg/dL 92    110   106    ?BUN 8 - 23 mg/dL '19   13   13    '$ ?Creatinine 0.61 - 1.24 mg/dL 1.09   0.82   0.77    ?Sodium 135 - 145 mmol/L 141   136   137    ?Potassium 3.5 - 5.1 mmol/L 4.5   4.6   4.2    ?Chloride 98 - 111 mmol/L 107   108   107    ?CO2 22 - 32 mmol/L '29   24   25    '$ ?Calcium 8.9 - 10.3 mg/dL 8.9   8.6   8.8    ?Total Protein 6.5 - 8.1 g/dL   6.6    ?Total Bilirubin 0.3 - 1.2 mg/dL   0.5    ?Alkaline Phos 38 - 126 U/L   66    ?AST 15 - 41 U/L   19    ?ALT 0 - 44 U/L   13    ? ? ? ?Imaging studies: No new pertinent imaging studies ? ? ?Assessment/Plan: (ICD-10's: K63.89) ?66 y.o. male with ascending colon mass and, improved, anemia with plan for laparoscopic right colectomy with Dr Dahlia Byes on Thursday (40/81/4481), complicated by significant psychiatric issues. ? ? - Okay to continue diet today ?-  Recheck Hgb tomorrow (05/10); goal is >8.5 for surgery; transfuse as needed ?- Initiate bowel prep tomorrow  ? - Monitor abdominal examination; on-going bowel function  ? - Pain control prn; antiemetic prn  ? - Mobilize as tolerated ?- Further management per primary service; we will follow   ? ?All of the above findings and recommendations were discussed with the patient, and the medical team. ? ? ?-- ?Edison Simon, PA-C ?[facility] ?10/11/2021, 7:18 AM ?M-F: 7am - 4pm ? ?

## 2021-10-11 NOTE — Progress Notes (Signed)
Mobility Specialist - Progress Note ? ? 10/11/21 1500  ?Mobility  ?Activity Refused mobility  ? ? ? ?2nd attempt this date. Pt declined mobility despite encouragement, no reason specified "I just can't do it today". Will attempt another date/time.  ? ? ?Kathee Delton ?Mobility Specialist ?10/11/21, 3:01 PM ? ? ? ? ?

## 2021-10-12 DIAGNOSIS — F17200 Nicotine dependence, unspecified, uncomplicated: Secondary | ICD-10-CM | POA: Diagnosis not present

## 2021-10-12 DIAGNOSIS — K6389 Other specified diseases of intestine: Secondary | ICD-10-CM | POA: Diagnosis not present

## 2021-10-12 DIAGNOSIS — F250 Schizoaffective disorder, bipolar type: Secondary | ICD-10-CM | POA: Diagnosis not present

## 2021-10-12 DIAGNOSIS — D509 Iron deficiency anemia, unspecified: Secondary | ICD-10-CM | POA: Diagnosis not present

## 2021-10-12 LAB — COMPREHENSIVE METABOLIC PANEL
ALT: 15 U/L (ref 0–44)
AST: 20 U/L (ref 15–41)
Albumin: 3.5 g/dL (ref 3.5–5.0)
Alkaline Phosphatase: 62 U/L (ref 38–126)
Anion gap: 4 — ABNORMAL LOW (ref 5–15)
BUN: 24 mg/dL — ABNORMAL HIGH (ref 8–23)
CO2: 25 mmol/L (ref 22–32)
Calcium: 8.8 mg/dL — ABNORMAL LOW (ref 8.9–10.3)
Chloride: 109 mmol/L (ref 98–111)
Creatinine, Ser: 1.02 mg/dL (ref 0.61–1.24)
GFR, Estimated: >60 mL/min (ref >60)
Glucose, Bld: 93 mg/dL (ref 70–99)
Potassium: 4.5 mmol/L (ref 3.5–5.1)
Sodium: 138 mmol/L (ref 135–145)
Total Bilirubin: 0.4 mg/dL (ref 0.3–1.2)
Total Protein: 6.6 g/dL (ref 6.5–8.1)

## 2021-10-12 LAB — CBC
HCT: 34.6 % — ABNORMAL LOW (ref 39.0–52.0)
Hemoglobin: 9.4 g/dL — ABNORMAL LOW (ref 13.0–17.0)
MCH: 20.6 pg — ABNORMAL LOW (ref 26.0–34.0)
MCHC: 27.2 g/dL — ABNORMAL LOW (ref 30.0–36.0)
MCV: 75.9 fL — ABNORMAL LOW (ref 80.0–100.0)
Platelets: 302 10*3/uL (ref 150–400)
RBC: 4.56 MIL/uL (ref 4.22–5.81)
RDW: 31.8 % — ABNORMAL HIGH (ref 11.5–15.5)
WBC: 7.5 10*3/uL (ref 4.0–10.5)
nRBC: 0 % (ref 0.0–0.2)

## 2021-10-12 LAB — GLUCOSE, CAPILLARY: Glucose-Capillary: 94 mg/dL (ref 70–99)

## 2021-10-12 MED ORDER — LORAZEPAM 2 MG/ML IJ SOLN
0.5000 mg | Freq: Four times a day (QID) | INTRAMUSCULAR | Status: DC
Start: 2021-10-12 — End: 2021-10-15
  Administered 2021-10-12 – 2021-10-15 (×6): 0.5 mg via INTRAVENOUS
  Filled 2021-10-12 (×7): qty 1

## 2021-10-12 MED ORDER — NEOMYCIN SULFATE 500 MG PO TABS
1000.0000 mg | ORAL_TABLET | Freq: Once | ORAL | Status: AC
  Administered 2021-10-12: 1000 mg via ORAL
  Filled 2021-10-12: qty 2

## 2021-10-12 MED ORDER — HALOPERIDOL LACTATE 5 MG/ML IJ SOLN: 1.0000 mg | Freq: Four times a day (QID) | INTRAMUSCULAR | Status: DC | PRN

## 2021-10-12 NOTE — Assessment & Plan Note (Signed)
Pt is getting Invega and Holdal injection ?-prn ativan 0.5 mg bid IV ?Patient appeared to be having hallucination and psychotic behavior.  I have reached out to psychiatry for evaluation ?

## 2021-10-12 NOTE — Assessment & Plan Note (Signed)
Continue nicotine patch  °

## 2021-10-12 NOTE — Progress Notes (Signed)
?Progress Note ? ? ?Patient: Dustin [name] NAT:[id number] DOB: [birth year] DOA: 10/05/2021     6 ?DOS: the patient was seen and examined on 10/12/2021 ?  ?Brief hospital course: ?66 year old male with past medical history of tobacco abuse, schizophrenia and tardive dyskinesia who presented to the emergency room after being redirected there by his PCP.  2 days prior, blood work came back with a hemoglobin of 6.2 and an MCV of 64.  In addition, patient notes a 15 to 20 pound weight loss over the last 6 months.  Patient also relates a 1 week history of pharyngeal esophageal dysphagia.  Patient was transfused 1 unit packed red blood cells and brought in for further evaluation to the hospitalist service.  GI was consulted and took patient for EGD and colonoscopy on 5/4.  Endoscopy unremarkable, but colonoscopy revealed incidental diverticula and a partially obstructing colonic mass in the ascending colon.  Interestingly, biopsies note tubulovillous adenoma and not adenocarcinoma of the colon.  General surgery consulted and plans to take patient for removal of colon mass on Thursday, 5/11.  Psychiatry consulted for psychosis behavior. ? ?5/9: wants to eat, surgery on 5/11 ?5/10: Surgery planned for tomorrow ? ? ?Assessment and Plan: ?* Colonic mass ?CEA level only at 6.  Biopsy samples on the note tubulovillous adenoma and not for adenocarcinoma of colon.  Cannot fully rule out malignancy without removing mass concerning for pathology.  General surgery plans to take patient to the OR on Thursday, 5/11 ? ?Microcytic anemia ?Status post 2 PRBC transfusion thus far cause is colonic mass and slow bleed over time.  Also received iron.  Hemoglobin stable at 9.4.  Surgeon would like to keep it above 8.5 before surgery ? ? ?Schizoaffective disorder, bipolar type ([place]) ?Pt is getting Invega and Holdal injection ?-prn ativan 0.5 mg bid IV ?Patient appeared to be having hallucination and psychotic behavior.  I have reached out to  psychiatry for evaluation ? ?Tobacco use disorder ?Continue nicotine patch. ? ?Iron deficiency anemia due to chronic blood loss ?Also received iron transfusion, and MCV improving. ? ?Syncope ?Patient cannot provide detailed information.  CT head negative.  No focal neuro deficit on physical examination.  May be due to volume depletion/anemia secondary to GI bleeding ?-No further syncope or dizzy spells while in the hospital. ? ? ? ? ? ?  ? ?Subjective: Crying easily and hallucinating.  Talking about mother's death.  Seeing things which are not there ? ?Physical Exam: ?Vitals:  ? 10/11/21 1935 10/12/21 0452 10/12/21 0732 10/12/21 1508  ?BP: (!) 98/59 90/65 101/66 124/81  ?Pulse: 64 81 73 (!) 49  ?Resp: '18 16 16 19  '$ ?Temp: 98.7 ?F (37.1 ?C) 98.2 ?F (36.8 ?C) 98.4 ?F (36.9 ?C) 97.9 ?F (36.6 ?C)  ?TempSrc:      ?SpO2: 100% 100% 97% 100%  ?Weight:      ?Height:      ? ?? General:?Alert and oriented x2, no acute distress ?? HEENT: Normocephalic and atraumatic, mucous membranes are slightly dry ?? Cardiovascular:?Regular rhythm, borderline bradycardia ?? Respiratory:?Clear to auscultation bilaterally ?? Abdomen:?Soft, nontender, nondistended, positive bowel sounds ?? Musculoskeletal: No clubbing or cyanosis or edema ?? Skin:?No skin breaks, tears or lesions ?? Psychiatry:?Visual hallucination, crying easily ?? Neurology:?No focal deficits ??  ?Data Reviewed: ? ?Hemoglobin 9.4 ? ?Family Communication: None ? ?Disposition: ?Status is: Inpatient ?Remains inpatient appropriate because: Surgery planned tomorrow ? ? Planned Discharge Destination: Home with [facility] facility ? ? ? DVT prophylaxis-SCDs ?  Time spent: 35 minutes ? ?Author: ?Max Sane, MD ?10/12/2021 4:28 PM ? ?For on call review [email or web address].  ?

## 2021-10-12 NOTE — Consult Note (Signed)
[facility] Face-to-Face Psychiatry Consult   Reason for Consult: Consult for 66 year old man with chronic psychosis who is having some emotional and mental symptoms prior to surgery Referring Physician:  Sherryll Burger Patient Identification: Dustin [name] MRN:  [id number] Principal Diagnosis: Colonic mass Diagnosis:  Principal Problem:   Colonic mass Active Problems:   Tobacco use disorder   Schizoaffective disorder, bipolar type (H[facility])   Microcytic anemia   Syncope   Iron deficiency anemia due to chronic blood loss   Total Time spent with patient: 45 minutes  Subjective:   Dustin [name] is a 66 y.o. male patient admitted with "I am scared".  HPI: Patient seen and chart reviewed.  66 year old man with longstanding chronic schizophrenia managed with long-acting injectable medicine.  He is admitted to the hospital this time for planned surgery to resect a mass in his colon.  Patient was seen by psychiatry team on Saturday and felt to be asymptomatic but today it sounds like he has been decompensating.  Patient was sitting up in bed very slowly eating supper when I came in.  He engaged to some extent in the conversation although as the talk went on he became more disorganized and distracted.  He told me that he was scared about having surgery.  Became tearful when he talked about it.  I asked him what element of it was frightening and he talked about being cut open.  I tried to talk him through this a little bit but his attention is very poor.  He continues to talk about how he has a transmitter and "hearing aid" in his teeth.  This is usually his way of saying that he is hearing things.  He also suddenly went off on a tangent about how he was tired of everyone diagnosing him and that somebody wanted him dead etc.  Hard to get his attention back at that point.  Patient spontaneously pointed out the jug of GI prep and said there was no way he would drink that.  I sympathized with him but told him it was  really important to this surgery and that it was not that bad.  Towards the end he said he would give it a try but I was not convinced he was that motivated.  Past Psychiatric History: Patient has longstanding schizophrenia or schizoaffective disorder.  Seen multiple times over the years by psychiatry service.  Has an ACT team and recently has been well enough controlled with to long-acting injectables to not require hospitalization.  Risk to Self:   Risk to Others:   Prior Inpatient Therapy:   Prior Outpatient Therapy:    Past Medical History:  Past Medical History:  Diagnosis Date   Schizophrenia [facility])     Past Surgical History:  Procedure Laterality Date   COLONOSCOPY N/A 10/06/2021   Procedure: COLONOSCOPY;  Surgeon: Toledo, Boykin Nearing, MD;  Location: [facility] ENDOSCOPY;  Service: Gastroenterology;  Laterality: N/A;   ESOPHAGOGASTRODUODENOSCOPY N/A 10/06/2021   Procedure: ESOPHAGOGASTRODUODENOSCOPY (EGD);  Surgeon: Toledo, Boykin Nearing, MD;  Location: [facility] ENDOSCOPY;  Service: Gastroenterology;  Laterality: N/A;   Family History:  Family History  Problem Relation Age of Onset   Lung cancer Father    Hypertension Brother    Hypertension Brother    Hypertension Brother    Prostate cancer Brother    Family Psychiatric  History: See previous. Social History:  Social History   Substance and Sexual Activity  Alcohol Use Not Currently     Social History   Substance  and Sexual Activity  Drug Use No    Social History   Socioeconomic History   Marital status: Single    Spouse name: Not on file   Number of children: Not on file   Years of education: Not on file   Highest education level: Not on file  Occupational History   Not on file  Tobacco Use   Smoking status: Every Day    Packs/day: 0.50    Years: 15.00    Pack years: 7.50    Types: Cigarettes   Smokeless tobacco: Never  Vaping Use   Vaping Use: Never used  Substance and Sexual Activity   Alcohol use: Not Currently    Drug use: No   Sexual activity: Never    Birth control/protection: None  Other Topics Concern   Not on file  Social History Narrative   Not on file   Social Determinants of Health   Financial Resource Strain: Not on file  Food Insecurity: Not on file  Transportation Needs: Not on file  Physical Activity: Not on file  Stress: Not on file  Social Connections: Not on file   Additional Social History:    Allergies:  No Known Allergies  Labs:  Results for orders placed or performed during the hospital encounter of 10/05/21 (from the past 48 hour(s))  Glucose, capillary     Status: Abnormal   Collection Time: 10/11/21  8:23 AM  Result Value Ref Range   Glucose-Capillary 118 (H) 70 - 99 mg/dL    Comment: Glucose reference range applies only to samples taken after fasting for at least 8 hours.  CBC     Status: Abnormal   Collection Time: 10/12/21  4:45 AM  Result Value Ref Range   WBC 7.5 4.0 - 10.5 K/uL   RBC 4.56 4.22 - 5.81 MIL/uL   Hemoglobin 9.4 (L) 13.0 - 17.0 g/dL   HCT 86.5 (L) 78.4 - 69.6 %   MCV 75.9 (L) 80.0 - 100.0 fL   MCH 20.6 (L) 26.0 - 34.0 pg   MCHC 27.2 (L) 30.0 - 36.0 g/dL   RDW 29.5 (H) 28.4 - 13.2 %   Platelets 302 150 - 400 K/uL    Comment: REPEATED TO VERIFY   nRBC 0.0 0.0 - 0.2 %    Comment: Performed at [facility], [address] [place], [place] [address]  Comprehensive metabolic panel     Status: Abnormal   Collection Time: 10/12/21  4:45 AM  Result Value Ref Range   Sodium 138 135 - 145 mmol/L   Potassium 4.5 3.5 - 5.1 mmol/L   Chloride 109 98 - 111 mmol/L   CO2 25 22 - 32 mmol/L   Glucose, Bld 93 70 - 99 mg/dL    Comment: Glucose reference range applies only to samples taken after fasting for at least 8 hours.   BUN 24 (H) 8 - 23 mg/dL   Creatinine, Ser 2.72 0.61 - 1.24 mg/dL   Calcium 8.8 (L) 8.9 - 10.3 mg/dL   Total Protein 6.6 6.5 - 8.1 g/dL   Albumin 3.5 3.5 - 5.0 g/dL   AST 20 15 - 41 U/L   ALT 15 0 - 44 U/L   Alkaline  Phosphatase 62 38 - 126 U/L   Total Bilirubin 0.4 0.3 - 1.2 mg/dL   GFR, Estimated >53 >66 mL/min    Comment: (NOTE) Calculated using the CKD-EPI Creatinine Equation (2021)    Anion gap 4 (L) 5 - 15  Comment: Performed at [facility], [address] [place], [place] [address]  Glucose, capillary     Status: None   Collection Time: 10/12/21  7:29 AM  Result Value Ref Range   Glucose-Capillary 94 70 - 99 mg/dL    Comment: Glucose reference range applies only to samples taken after fasting for at least 8 hours.   Comment 1 Notify RN    Comment 2 Document in Chart     Current Facility-Administered Medications  Medication Dose Route Frequency Provider Last Rate Last Admin   acetaminophen (TYLENOL) tablet 650 mg  650 mg Oral Q6H PRN Lorretta Harp, MD   650 mg at 10/07/21 2231   bismuth subsalicylate (PEPTO BISMOL) chewable tablet 524 mg  524 mg Oral Q6H Gilda Crease, PA-C   524 mg at 10/12/21 0945   [START ON 10/13/2021] cefoTEtan (CEFOTAN) 2 g in sodium chloride 0.9 % 100 mL IVPB  2 g Intravenous Once Pabon, [place] F, MD       ferrous sulfate tablet 325 mg  325 mg Oral BID WC Lorretta Harp, MD   325 mg at 10/12/21 0505   haloperidol lactate (HALDOL) injection 1 mg  1 mg Intravenous Q6H PRN Octavio Matheney, Jackquline Denmark, MD       LORazepam (ATIVAN) injection 0.5 mg  0.5 mg Intravenous Q6H Ica Daye, Jackquline Denmark, MD       metroNIDAZOLE (FLAGYL) tablet 250 mg  250 mg Oral Q6H Croley, [place] M, PA-C   250 mg at 10/12/21 1150   neomycin (MYCIFRADIN) tablet 1,000 mg  1,000 mg Oral Once Pabon, Diego F, MD       neomycin (MYCIFRADIN) tablet 1,000 mg  1,000 mg Oral Once Pabon, [place] F, MD       nicotine (NICODERM CQ - dosed in mg/24 hours) patch 21 mg  21 mg Transdermal Daily Lorretta Harp, MD   21 mg at 10/12/21 0945   ondansetron (ZOFRAN) injection 4 mg  4 mg Intravenous Q8H PRN Lorretta Harp, MD       pantoprazole (PROTONIX) EC tablet 40 mg  40 mg Oral BID Wylie Hail M, PA-C   40 mg at 10/12/21 6045    senna (SENOKOT) tablet 8.6 mg  1 tablet Oral Daily PRN Lorretta Harp, MD       tetracycline (SUMYCIN) capsule 500 mg  500 mg Oral QID Croley, Granville M, PA-C   500 mg at 10/12/21 1357    Musculoskeletal: Strength & Muscle Tone: within normal limits Gait & Station: normal Patient leans: N/A            Psychiatric Specialty Exam:  Presentation  General Appearance: No data recorded Eye Contact:No data recorded Speech:No data recorded Speech Volume:No data recorded Handedness:No data recorded  Mood and Affect  Mood:No data recorded Affect:No data recorded  Thought Process  Thought Processes:No data recorded Descriptions of Associations:No data recorded Orientation:No data recorded Thought Content:No data recorded History of Schizophrenia/Schizoaffective disorder:No data recorded Duration of Psychotic Symptoms:No data recorded Hallucinations:No data recorded Ideas of Reference:No data recorded Suicidal Thoughts:No data recorded Homicidal Thoughts:No data recorded  Sensorium  Memory:No data recorded Judgment:No data recorded Insight:No data recorded  Executive Functions  Concentration:No data recorded Attention Span:No data recorded Recall:No data recorded Fund of Knowledge:No data recorded Language:No data recorded  Psychomotor Activity  Psychomotor Activity:No data recorded  Assets  Assets:No data recorded  Sleep  Sleep:No data recorded  Physical Exam: Physical Exam Vitals and nursing note reviewed.  Constitutional:  Appearance: Normal appearance.  HENT:     Head: Normocephalic and atraumatic.     Mouth/Throat:     Pharynx: Oropharynx is clear.  Eyes:     Pupils: Pupils are equal, round, and reactive to light.  Cardiovascular:     Rate and Rhythm: Normal rate and regular rhythm.  Pulmonary:     Effort: Pulmonary effort is normal.     Breath sounds: Normal breath sounds.  Abdominal:     General: Abdomen is flat.     Palpations:  Abdomen is soft.  Musculoskeletal:        General: Normal range of motion.  Skin:    General: Skin is warm and dry.  Neurological:     General: No focal deficit present.     Mental Status: He is alert. Mental status is at baseline.  Psychiatric:        Attention and Perception: He is inattentive.        Mood and Affect: Mood normal. Affect is labile.        Speech: Speech is tangential.        Behavior: Behavior is agitated. Behavior is not aggressive.        Thought Content: Thought content is paranoid and delusional.        Cognition and Memory: Cognition is impaired. Memory is impaired.        Judgment: Judgment is inappropriate.   Review of Systems  Constitutional: Negative.   HENT: Negative.    Eyes: Negative.   Respiratory: Negative.    Cardiovascular: Negative.   Gastrointestinal: Negative.   Musculoskeletal: Negative.   Skin: Negative.   Neurological: Negative.   Psychiatric/Behavioral:  Negative for depression, hallucinations, memory loss, substance abuse and suicidal ideas. The patient is nervous/anxious. The patient does not have insomnia.   Blood pressure 124/81, pulse (!) 49, temperature 97.9 F (36.6 C), resp. rate 19, height 5\' 7"  (1.702 m), weight 60.7 kg, SpO2 100 %. Body mass index is 20.96 kg/m.  Treatment Plan Summary: Medication management and Plan unfortunately even with the best of medication most people with schizophrenia will still have some degree of symptoms present at baseline and under stressful circumstances these can emerge and become more problematic.  If he were getting agitated in a dangerous way it might be more necessary to force some medicine.  I tried to suggest to him that a little bit of medicine to help with his nerves might be useful but even that was making him angry.  And on the other hand, if we sedate him the chances that he is going to finish his GI prep are much worse.  Trying to balance this out I changed his Ativan in order to 0.5 mg  every 6 hours standing for the moment which hopefully will be enough to calm his nerves without putting him to sleep.  I also added IV Haldol as a as needed if he becomes agitated.  At the moment probably the most important goal would be to just get him to be compliant with the bowel prep.  Nursing should be very kind and patient with him trying to push him to complete it if possible.  We will continue to follow up.  Disposition: Patient does not meet criteria for psychiatric inpatient admission. Supportive therapy provided about ongoing stressors.  Mordecai Rasmussen, MD 10/12/2021 5:01 PM

## 2021-10-12 NOTE — Assessment & Plan Note (Signed)
Patient cannot provide detailed information.  CT head negative.  No focal neuro deficit on physical examination.  May be due to volume depletion/anemia secondary to GI bleeding ?-No further syncope or dizzy spells while in the hospital. ? ?

## 2021-10-12 NOTE — Progress Notes (Signed)
Mobility Specialist - Progress Note ? ? 10/12/21 1200  ?Mobility  ?Activity Ambulated with assistance in hallway  ?Level of Assistance Standby assist, set-up cues, supervision of patient - no hands on  ?Assistive Device Front wheel walker  ?Distance Ambulated (ft) 90 ft  ?Activity Response Tolerated well  ?$Mobility charge 1 Mobility  ? ? ? ?Pt lying in bed upon arrival, utilizing RA. Pt ambulated in hallway with supervision. VC to keep RW close and correct upright posture; pt temporarily corrects but then returns to flexed posture. Pt declined transfer to chair. Pt returned to bed with alarm set, needs in reach.  ? ? ?Dustin [name] ?Mobility Specialist ?10/12/21, 12:17 PM ? ? ? ? ?

## 2021-10-12 NOTE — Assessment & Plan Note (Signed)
CEA level only at 6.  Biopsy samples on the note tubulovillous adenoma and not for adenocarcinoma of colon.  Cannot fully rule out malignancy without removing mass concerning for pathology.  General surgery plans to take patient to the OR on Thursday, 5/11 ?

## 2021-10-12 NOTE — Progress Notes (Signed)
Patient has only drank 1.5 cups of his prep for surgery tomorrow. He is stating he has some nausea but currently refusing any medication for it. Will continue to check on patient and encourage drinking prep. ?

## 2021-10-12 NOTE — Progress Notes (Addendum)
[facility] ?SURGICAL PROGRESS NOTE (cpt [phone]) ? ?Hospital Day(s): 6.  ? ?Interval History: Patient seen and examined, no acute events or new complaints overnight. Patient reports he is doing well. Some abdominal discomfort reported. No fever, chills, nausea, emesis. He is not the most reliable historian secondary to history of significant psychiatric issues. No new labs this morning. Hgb improving; up to 9.4. Renal function remains normal; sCr - 1.02. No electrolyte derangements. Back down to CLD. Plan for bowel preporation today in anticipation of surgery tomorrow (05/11) ? ?Review of Systems:  ?Unable to reliably preform secondary to psychiatric history ? ?Vital signs in last 24 hours: [min-max] current  ?Temp:  [98.1 ?F (36.7 ?C)-98.7 ?F (37.1 ?C)] 98.4 ?F (36.9 ?C) (05/10 0732) ?Pulse Rate:  [64-81] 73 (05/10 0732) ?Resp:  [15-20] 16 (05/10 0732) ?BP: (90-113)/(59-70) 101/66 (05/10 0732) ?SpO2:  [97 %-100 %] 97 % (05/10 0732)     Height: '5\' 7"'$  (170.2 cm) Weight: 60.7 kg BMI (Calculated): 20.95  ? ?Intake/Output last 2 shifts:  ?05/09 0701 - 05/10 0700 ?In: 32 [P.O.:760] ?Out: 2450 [Urine:2450]  ? ?Physical Exam:  ?Constitutional: alert, cooperative and no distress  ?HENT: normocephalic without obvious abnormality  ?Eyes: PERRL, EOM's grossly intact and symmetric  ?Respiratory: breathing non-labored at rest  ?Cardiovascular: regular rate and sinus rhythm  ?Gastrointestinal: soft, non-tender, and non-distended, no rebound/guarding ?Musculoskeletal: no edema or wounds, motor and sensation grossly intact, NT  ? ? ?Labs:  ? ?  Latest Ref Rng & Units 10/12/2021  ?  4:45 AM 10/10/2021  ?  4:46 AM 10/08/2021  ?  7:13 AM  ?CBC  ?WBC 4.0 - 10.5 K/uL 7.5   8.5   6.8    ?Hemoglobin 13.0 - 17.0 g/dL 9.4   8.6   7.8    ?Hematocrit 39.0 - 52.0 % 34.6   31.3   28.9    ?Platelets 150 - 400 K/uL 302   324   312    ? ? ?  Latest Ref Rng & Units 10/12/2021  ?  4:45 AM 10/10/2021  ?  4:46 AM 10/07/2021  ?  4:12 AM  ?CMP   ?Glucose 70 - 99 mg/dL 93   92   110    ?BUN 8 - 23 mg/dL '24   19   13    '$ ?Creatinine 0.61 - 1.24 mg/dL 1.02   1.09   0.82    ?Sodium 135 - 145 mmol/L 138   141   136    ?Potassium 3.5 - 5.1 mmol/L 4.5   4.5   4.6    ?Chloride 98 - 111 mmol/L 109   107   108    ?CO2 22 - 32 mmol/L '25   29   24    '$ ?Calcium 8.9 - 10.3 mg/dL 8.8   8.9   8.6    ?Total Protein 6.5 - 8.1 g/dL 6.6      ?Total Bilirubin 0.3 - 1.2 mg/dL 0.4      ?Alkaline Phos 38 - 126 U/L 62      ?AST 15 - 41 U/L 20      ?ALT 0 - 44 U/L 15      ? ? ? ?Imaging studies: No new pertinent imaging studies ? ? ?Assessment/Plan: (ICD-10's: K63.89) ?66 y.o. male with ascending colon mass and, improved, anemia with plan for laparoscopic right colectomy with Dr Dahlia Byes on Thursday (82/[id number]), complicated by significant psychiatric issues. ? ? - CLD today; NPO at midnight  ?-  Initiate bowel prep today ?- Plan for laparoscopic right colectomy tomorrow with Dr Dahlia Byes pending OR/Anesthesia availability. Dr Dahlia Byes has discussed procedure in detail with the patient's brother who is in agreement.  ? - Monitor abdominal examination; on-going bowel function  ? - Pain control prn; antiemetic prn  ? - Mobilize as tolerated ?- Further management per primary service; we will follow   ? ?All of the above findings and recommendations were discussed with the patient, and the medical team. ? ?-- ?Edison Simon, PA-C ?[facility] ?10/12/2021, 7:33 AM ?M-F: 7am - 4pm ? ?

## 2021-10-12 NOTE — Assessment & Plan Note (Signed)
Also received iron transfusion, and MCV improving. ?

## 2021-10-12 NOTE — Assessment & Plan Note (Signed)
Status post 2 PRBC transfusion thus far cause is colonic mass and slow bleed over time.  Also received iron.  Hemoglobin stable at 9.4.  Surgeon would like to keep it above 8.5 before surgery ? ?

## 2021-10-13 ENCOUNTER — Encounter
Admission: EM | Disposition: A | Discharge status: Home / Self Care | Payer: Medicare Other | Source: Home / Self Care | Attending: Internal Medicine

## 2021-10-13 ENCOUNTER — Inpatient Hospital Stay: Payer: Medicare Other | Admitting: Anesthesiology

## 2021-10-13 ENCOUNTER — Encounter: Payer: Self-pay | Admitting: Internal Medicine

## 2021-10-13 DIAGNOSIS — D509 Iron deficiency anemia, unspecified: Secondary | ICD-10-CM | POA: Diagnosis not present

## 2021-10-13 DIAGNOSIS — F17200 Nicotine dependence, unspecified, uncomplicated: Secondary | ICD-10-CM | POA: Diagnosis not present

## 2021-10-13 DIAGNOSIS — K6389 Other specified diseases of intestine: Secondary | ICD-10-CM | POA: Diagnosis not present

## 2021-10-13 DIAGNOSIS — C182 Malignant neoplasm of ascending colon: Secondary | ICD-10-CM

## 2021-10-13 DIAGNOSIS — F250 Schizoaffective disorder, bipolar type: Secondary | ICD-10-CM | POA: Diagnosis not present

## 2021-10-13 HISTORY — PX: PARTIAL COLECTOMY: SHX5273

## 2021-10-13 LAB — GLUCOSE, CAPILLARY: Glucose-Capillary: 93 mg/dL (ref 70–99)

## 2021-10-13 SURGERY — COLECTOMY, PARTIAL
Anesthesia: General

## 2021-10-13 MED ORDER — BUPIVACAINE-EPINEPHRINE (PF) 0.5% -1:200000 IJ SOLN
INTRAMUSCULAR | Status: AC
  Filled 2021-10-13: qty 30

## 2021-10-13 MED ORDER — CHLORHEXIDINE GLUCONATE CLOTH 2 % EX PADS
6.0000 | MEDICATED_PAD | Freq: Every day | CUTANEOUS | Status: DC
  Administered 2021-10-13: 6 via TOPICAL

## 2021-10-13 MED ORDER — SODIUM CHLORIDE 0.9 % IV SOLN
INTRAVENOUS | Status: DC | PRN
  Administered 2021-10-13: 70 mL

## 2021-10-13 MED ORDER — MORPHINE SULFATE (PF) 2 MG/ML IV SOLN: 2.0000 mg | INTRAVENOUS | Status: DC | PRN

## 2021-10-13 MED ORDER — OXYCODONE HCL 5 MG PO TABS: 5.0000 mg | ORAL_TABLET | ORAL | Status: DC | PRN

## 2021-10-13 MED ORDER — HYDROMORPHONE HCL 1 MG/ML IJ SOLN: 0.2500 mg | INTRAMUSCULAR | Status: DC | PRN

## 2021-10-13 MED ORDER — FENTANYL CITRATE (PF) 100 MCG/2ML IJ SOLN: 25.0000 ug | INTRAMUSCULAR | Status: DC | PRN

## 2021-10-13 MED ORDER — ONDANSETRON HCL 4 MG/2ML IJ SOLN
INTRAMUSCULAR | Status: DC | PRN
  Administered 2021-10-13: 4 mg via INTRAVENOUS

## 2021-10-13 MED ORDER — OXYCODONE HCL 5 MG PO TABS: 5.0000 mg | ORAL_TABLET | Freq: Once | ORAL | Status: DC | PRN

## 2021-10-13 MED ORDER — PROPOFOL 10 MG/ML IV BOLUS
INTRAVENOUS | Status: AC
  Filled 2021-10-13: qty 20

## 2021-10-13 MED ORDER — PHENYLEPHRINE 80 MCG/ML (10ML) SYRINGE FOR IV PUSH (FOR BLOOD PRESSURE SUPPORT)
PREFILLED_SYRINGE | INTRAVENOUS | Status: DC | PRN
  Administered 2021-10-13 (×2): 120 ug via INTRAVENOUS

## 2021-10-13 MED ORDER — SODIUM CHLORIDE 0.9 % IV SOLN
2.0000 g | Freq: Two times a day (BID) | INTRAVENOUS | Status: AC
  Administered 2021-10-14 (×2): 2 g via INTRAVENOUS
  Filled 2021-10-13 (×2): qty 2

## 2021-10-13 MED ORDER — MIDAZOLAM HCL 2 MG/2ML IJ SOLN
INTRAMUSCULAR | Status: AC
  Filled 2021-10-13: qty 2

## 2021-10-13 MED ORDER — HYDROMORPHONE HCL 1 MG/ML IJ SOLN
INTRAMUSCULAR | Status: DC | PRN
  Administered 2021-10-13: .5 mg via INTRAVENOUS

## 2021-10-13 MED ORDER — DEXAMETHASONE SODIUM PHOSPHATE 10 MG/ML IJ SOLN
INTRAMUSCULAR | Status: DC | PRN
  Administered 2021-10-13: 4 mg via INTRAVENOUS

## 2021-10-13 MED ORDER — SUGAMMADEX SODIUM 200 MG/2ML IV SOLN
INTRAVENOUS | Status: DC | PRN
  Administered 2021-10-13: 200 mg via INTRAVENOUS

## 2021-10-13 MED ORDER — GLYCOPYRROLATE 0.2 MG/ML IJ SOLN
INTRAMUSCULAR | Status: DC | PRN
Start: 2021-10-13 — End: 2021-10-13
  Administered 2021-10-13: .2 mg via INTRAVENOUS

## 2021-10-13 MED ORDER — FENTANYL CITRATE (PF) 100 MCG/2ML IJ SOLN
INTRAMUSCULAR | Status: AC
  Filled 2021-10-13: qty 2

## 2021-10-13 MED ORDER — OXYCODONE HCL 5 MG/5ML PO SOLN: 5.0000 mg | Freq: Once | ORAL | Status: DC | PRN

## 2021-10-13 MED ORDER — ESMOLOL HCL 100 MG/10ML IV SOLN
INTRAVENOUS | Status: DC | PRN
  Administered 2021-10-13 (×4): 20 mg via INTRAVENOUS

## 2021-10-13 MED ORDER — LACTATED RINGERS IV SOLN
INTRAVENOUS | Status: DC | PRN
Start: 2021-10-13 — End: 2021-10-13

## 2021-10-13 MED ORDER — 0.9 % SODIUM CHLORIDE (POUR BTL) OPTIME
TOPICAL | Status: DC | PRN
  Administered 2021-10-13: 1000 mL

## 2021-10-13 MED ORDER — FENTANYL CITRATE (PF) 100 MCG/2ML IJ SOLN
INTRAMUSCULAR | Status: DC | PRN
  Administered 2021-10-13: 25 ug via INTRAVENOUS
  Administered 2021-10-13: 75 ug via INTRAVENOUS

## 2021-10-13 MED ORDER — LACTATED RINGERS IV SOLN: INTRAVENOUS | Status: DC

## 2021-10-13 MED ORDER — ONDANSETRON HCL 4 MG/2ML IJ SOLN: 4.0000 mg | Freq: Once | INTRAMUSCULAR | Status: DC | PRN

## 2021-10-13 MED ORDER — PHENYLEPHRINE HCL-NACL 20-0.9 MG/250ML-% IV SOLN
INTRAVENOUS | Status: DC | PRN
  Administered 2021-10-13: 25 ug/min via INTRAVENOUS

## 2021-10-13 MED ORDER — ROCURONIUM BROMIDE 100 MG/10ML IV SOLN
INTRAVENOUS | Status: DC | PRN
  Administered 2021-10-13 (×2): 10 mg via INTRAVENOUS
  Administered 2021-10-13: 35 mg via INTRAVENOUS
  Administered 2021-10-13: 25 mg via INTRAVENOUS

## 2021-10-13 MED ORDER — HYDROMORPHONE HCL 1 MG/ML IJ SOLN
INTRAMUSCULAR | Status: AC
  Filled 2021-10-13: qty 1

## 2021-10-13 MED ORDER — ACETAMINOPHEN 500 MG PO TABS
1000.0000 mg | ORAL_TABLET | Freq: Four times a day (QID) | ORAL | Status: DC
  Administered 2021-10-13 – 2021-10-18 (×17): 1000 mg via ORAL
  Filled 2021-10-13 (×21): qty 2

## 2021-10-13 MED ORDER — BUPIVACAINE LIPOSOME 1.3 % IJ SUSP
INTRAMUSCULAR | Status: AC
  Filled 2021-10-13: qty 20

## 2021-10-13 MED ORDER — PROPOFOL 10 MG/ML IV BOLUS
INTRAVENOUS | Status: DC | PRN
  Administered 2021-10-13: 100 mg via INTRAVENOUS

## 2021-10-13 MED ORDER — ACETAMINOPHEN 10 MG/ML IV SOLN
INTRAVENOUS | Status: AC
  Filled 2021-10-13: qty 100

## 2021-10-13 MED ORDER — KETOROLAC TROMETHAMINE 15 MG/ML IJ SOLN
15.0000 mg | Freq: Four times a day (QID) | INTRAMUSCULAR | Status: DC
  Administered 2021-10-13 – 2021-10-15 (×6): 15 mg via INTRAVENOUS
  Filled 2021-10-13 (×6): qty 1

## 2021-10-13 MED ORDER — LIDOCAINE HCL (CARDIAC) PF 100 MG/5ML IV SOSY
PREFILLED_SYRINGE | INTRAVENOUS | Status: DC | PRN
  Administered 2021-10-13: 60 mg via INTRAVENOUS

## 2021-10-13 MED ORDER — ACETAMINOPHEN 10 MG/ML IV SOLN: 1000.0000 mg | Freq: Once | INTRAVENOUS | Status: DC | PRN

## 2021-10-13 MED ORDER — BUPIVACAINE-EPINEPHRINE (PF) 0.5% -1:200000 IJ SOLN
INTRAMUSCULAR | Status: DC | PRN
  Administered 2021-10-13: 30 mL

## 2021-10-13 MED ORDER — ACETAMINOPHEN 10 MG/ML IV SOLN
INTRAVENOUS | Status: DC | PRN
Start: 2021-10-13 — End: 2021-10-13
  Administered 2021-10-13: 1000 mg via INTRAVENOUS

## 2021-10-13 SURGICAL SUPPLY — 64 items
ADH SKN CLS APL DERMABOND .7 (GAUZE/BANDAGES/DRESSINGS) ×4
BARRIER ADH SEPRAFILM 3INX5IN (MISCELLANEOUS) ×2 IMPLANT
BLADE SURG SZ10 CARB STEEL (BLADE) ×3 IMPLANT
BRR ADH 5X3 SEPRAFILM 2 SHT (MISCELLANEOUS) ×2
DERMABOND ADVANCED (GAUZE/BANDAGES/DRESSINGS) ×2
DERMABOND ADVANCED .7 DNX12 (GAUZE/BANDAGES/DRESSINGS) ×4 IMPLANT
DRAPE INCISE IOBAN 66X45 STRL (DRAPES) ×3 IMPLANT
DRSG OPSITE POSTOP 4X8 (GAUZE/BANDAGES/DRESSINGS) ×2 IMPLANT
ELECT CAUTERY BLADE 6.4 (BLADE) ×3 IMPLANT
ELECT REM PT RETURN 9FT ADLT (ELECTROSURGICAL) ×3
ELECTRODE REM PT RTRN 9FT ADLT (ELECTROSURGICAL) ×2 IMPLANT
GLOVE BIO SURGEON STRL SZ7 (GLOVE) ×9 IMPLANT
GOWN STRL REUS W/ TWL LRG LVL3 (GOWN DISPOSABLE) ×8 IMPLANT
GOWN STRL REUS W/TWL LRG LVL3 (GOWN DISPOSABLE) ×12
HANDLE SUCTION POOLE (INSTRUMENTS) ×2 IMPLANT
HANDLE YANKAUER SUCT BULB TIP (MISCELLANEOUS) ×3 IMPLANT
HOLDER FOLEY CATH W/STRAP (MISCELLANEOUS) ×3 IMPLANT
IRRIGATION STRYKERFLOW (MISCELLANEOUS) IMPLANT
IRRIGATOR STRYKERFLOW (MISCELLANEOUS)
MANIFOLD NEPTUNE II (INSTRUMENTS) ×3 IMPLANT
NEEDLE HYPO 22GX1.5 SAFETY (NEEDLE) ×3 IMPLANT
NS IRRIG 1000ML POUR BTL (IV SOLUTION) ×1 IMPLANT
PACK COLON CLEAN CLOSURE (MISCELLANEOUS) ×3 IMPLANT
PACK LAP CHOLECYSTECTOMY (MISCELLANEOUS) ×3 IMPLANT
PENCIL ELECTRO HAND CTR (MISCELLANEOUS) ×3 IMPLANT
RELOAD PROXIMATE 75MM BLUE (ENDOMECHANICALS) ×6 IMPLANT
RELOAD STAPLE 60 2.6 WHT THN (STAPLE) ×2 IMPLANT
RELOAD STAPLE 60 3.6 BLU REG (STAPLE) ×2 IMPLANT
RELOAD STAPLE 75 3.8 BLU REG (ENDOMECHANICALS) IMPLANT
RELOAD STAPLER BLUE 60MM (STAPLE) IMPLANT
RELOAD STAPLER WHITE 60MM (STAPLE) IMPLANT
RETRACTOR WOUND ALXS 18CM MED (MISCELLANEOUS) IMPLANT
RTRCTR WOUND ALEXIS O 18CM MED (MISCELLANEOUS)
SHEARS HARMONIC ACE PLUS 36CM (ENDOMECHANICALS) ×3 IMPLANT
SLEEVE ENDOPATH XCEL 5M (ENDOMECHANICALS) IMPLANT
SPIKE FLUID TRANSFER (MISCELLANEOUS) ×1 IMPLANT
SPONGE T-LAP 18X18 ~~LOC~~+RFID (SPONGE) ×9 IMPLANT
SPONGE T-LAP 18X36 ~~LOC~~+RFID STR (SPONGE) ×1 IMPLANT
STAPLE ECHEON FLEX 60 POW ENDO (STAPLE) ×1 IMPLANT
STAPLER PROXIMATE 75MM BLUE (STAPLE) ×6 IMPLANT
STAPLER RELOAD BLUE 60MM (STAPLE)
STAPLER RELOAD WHITE 60MM (STAPLE)
SUCTION POOLE HANDLE (INSTRUMENTS) ×3
SUT MNCRL 4-0 (SUTURE)
SUT MNCRL 4-0 27XMFL (SUTURE)
SUT PDS AB 0 CT1 27 (SUTURE) ×6 IMPLANT
SUT SILK 2 0 (SUTURE)
SUT SILK 2 0 SH CR/8 (SUTURE) ×3 IMPLANT
SUT SILK 2 0SH CR/8 30 (SUTURE) ×2 IMPLANT
SUT SILK 2-0 30XBRD TIE 12 (SUTURE) ×1 IMPLANT
SUT VIC AB 3-0 SH 27 (SUTURE)
SUT VIC AB 3-0 SH 27X BRD (SUTURE) ×1 IMPLANT
SUT VICRYL 0 AB UR-6 (SUTURE) ×6 IMPLANT
SUTURE MNCRL 4-0 27XMF (SUTURE) ×2 IMPLANT
SYR 30ML LL (SYRINGE) ×3 IMPLANT
SYS LAPSCP GELPORT 120MM (MISCELLANEOUS) ×3
SYSTEM LAPSCP GELPORT 120MM (MISCELLANEOUS) ×2 IMPLANT
TOWEL OR 17X26 4PK STRL BLUE (TOWEL DISPOSABLE) ×3 IMPLANT
TRAY FOLEY MTR SLVR 16FR STAT (SET/KITS/TRAYS/PACK) ×3 IMPLANT
TROCAR XCEL 12X100 BLDLESS (ENDOMECHANICALS) ×1 IMPLANT
TROCAR XCEL BLUNT TIP 100MML (ENDOMECHANICALS) ×1 IMPLANT
TROCAR XCEL NON-BLD 5MMX100MML (ENDOMECHANICALS) ×1 IMPLANT
TUBING EVAC SMOKE HEATED PNEUM (TUBING) ×1 IMPLANT
WATER STERILE IRR 500ML POUR (IV SOLUTION) ×1 IMPLANT

## 2021-10-13 NOTE — Transfer of Care (Signed)
Immediate Anesthesia Transfer of Care Note ? ?Patient: Dustin [name] ? ?Procedure(s) Performed: RIGHT COLECTOMY ? ?Patient Location: PACU ? ?Anesthesia Type:General ? ?Level of Consciousness: awake and drowsy ? ?Airway & Oxygen Therapy: Patient Spontanous Breathing and Patient connected to face mask oxygen ? ?Post-op Assessment: Report given to RN, Post -op Vital signs reviewed and stable and Patient moving all extremities ? ?Post vital signs: Reviewed and stable ? ?Last Vitals:  ?Vitals Value Taken Time  ?BP 90/67 10/13/21 1630  ?Temp    ?Pulse 110 10/13/21 1635  ?Resp 18 10/13/21 1635  ?SpO2 100 % 10/13/21 1635  ?Vitals shown include unvalidated device data. ? ?Last Pain:  ?Vitals:  ? 10/13/21 0750  ?TempSrc:   ?PainSc: 0-No pain  ?   ? ?Patients Stated Pain Goal: 0 (10/07/21 2231) ? ?Complications: No notable events documented. ?

## 2021-10-13 NOTE — Assessment & Plan Note (Signed)
Continue nicotine patch  °

## 2021-10-13 NOTE — Progress Notes (Signed)
Mobility Specialist - Progress Note ? ? 10/13/21 1300  ?Mobility  ?Activity Off unit  ? ? ? ?Pt off unit for procedure. Will attempt another date/time.  ? ? ?Kathee Delton ?Mobility Specialist ?10/13/21, 1:51 PM ? ? ? ? ?

## 2021-10-13 NOTE — Anesthesia Preprocedure Evaluation (Signed)
Anesthesia Evaluation  ?Patient identified by MRN, date of birth, ID band ?Patient awake ? ? ? ?Reviewed: ?Allergy & Precautions, NPO status , Patient's Chart, lab work & pertinent test results ? ?History of Anesthesia Complications ?Negative for: history of anesthetic complications ? ?Airway ?Mallampati: III ? ? ?Neck ROM: Full ? ? ? Dental ? ?(+) Poor Dentition ?  ?Pulmonary ?Current Smoker (2 ppd) and Patient abstained from smoking.,  ?  ?Pulmonary exam normal ?breath sounds clear to auscultation ? ? ? ? ? ? Cardiovascular ?Normal cardiovascular exam ?Rhythm:Regular Rate:Normal ? ? ?  ?Neuro/Psych ?PSYCHIATRIC DISORDERS Schizophrenia Hx tardive dyskinesia ?  ? GI/Hepatic ?GI bleed with colonic mass ?  ?Endo/Other  ?negative endocrine ROS ? Renal/GU ?negative Renal ROS  ? ?  ?Musculoskeletal ? ? Abdominal ?  ?Peds ? Hematology ? ?(+) Blood dyscrasia, anemia ,   ?Anesthesia Other Findings ? ? Reproductive/Obstetrics ? ?  ? ? ? ? ? ? ? ? ? ? ? ? ? ?  ?  ? ? ? ? ? ? ? ? ?Anesthesia Physical ?Anesthesia Plan ? ?ASA: 3 ? ?Anesthesia Plan: General  ? ?Post-op Pain Management:   ? ?Induction: Intravenous ? ?PONV Risk Score and Plan: 1 and Ondansetron, Dexamethasone and Treatment may vary due to age or medical condition ? ?Airway Management Planned: Oral ETT ? ?Additional Equipment:  ? ?Intra-op Plan:  ? ?Post-operative Plan: Extubation in OR ? ?Informed Consent: I have reviewed the patients History and Physical, chart, labs and discussed the procedure including the risks, benefits and alternatives for the proposed anesthesia with the patient or authorized representative who has indicated his/her understanding and acceptance.  ? ? ? ?Dental advisory given ? ?Plan Discussed with: CRNA ? ?Anesthesia Plan Comments: (Patient consented for risks of anesthesia including but not limited to:  ?- adverse reactions to medications ?- damage to eyes, teeth, lips or other oral mucosa ?- nerve damage  due to positioning  ?- sore throat or hoarseness ?- damage to heart, brain, nerves, lungs, other parts of body or loss of life ? ?Informed patient about role of CRNA in peri- and intra-operative care.  Patient voiced understanding.)  ? ? ? ? ? ? ?Anesthesia Quick Evaluation ? ?

## 2021-10-13 NOTE — Progress Notes (Signed)
I have gotten this patient to drink about 3 more cups of prep. He continues to state he cannot drink it all. Bowel movements are liquid now but very dark brown. ?

## 2021-10-13 NOTE — Assessment & Plan Note (Signed)
Also received iron transfusion, and MCV improving ?

## 2021-10-13 NOTE — Progress Notes (Signed)
?  Progress Note ? ? ?Patient: Dustin [name] ZWC:[id number] DOB: March 13, 1956 DOA: 10/05/2021     7 ?DOS: the patient was seen and examined on 10/13/2021 ?  ?Brief hospital course: ?66 year old male with past medical history of tobacco abuse, schizophrenia and tardive dyskinesia who presented to the emergency room after being redirected there by his PCP.  2 days prior, blood work came back with a hemoglobin of 6.2 and an MCV of 64.  In addition, patient notes a 15 to 20 pound weight loss over the last 6 months.  Patient also relates a 1 week history of pharyngeal esophageal dysphagia.  Patient was transfused 1 unit packed red blood cells and brought in for further evaluation to the hospitalist service.  GI was consulted and took patient for EGD and colonoscopy on 5/4.  Endoscopy unremarkable, but colonoscopy revealed incidental diverticula and a partially obstructing colonic mass in the ascending colon.  Interestingly, biopsies note tubulovillous adenoma and not adenocarcinoma of the colon.  General surgery consulted and plans to take patient for removal of colon mass on Thursday, 5/11.  Psychiatry consulted for psychosis behavior. ? ?5/9: wants to eat, surgery on 5/11 ?5/10: Psych reeval for schizophrenia has patient is hallucinating and worried about surgery ?5/11: Surgery today ? ? ? ?Assessment and Plan: ?* Colonic mass ?CEA level only at 6.  Biopsy samples on the note tubulovillous adenoma and not for adenocarcinoma of colon.  Cannot fully rule out malignancy without removing mass concerning for pathology.  Surgery planned for today ? ?Microcytic anemia ?Status post 2 PRBC transfusion thus far cause is colonic mass and slow bleed over time.  Also received iron.  Hemoglobin stable at 9.4.   ? ? ?Schizoaffective disorder, bipolar type ([place]) ?Pt is getting Invega and Holdal injection ?-IV ativan 0.5 mg every 6 as needed ?Patient appeared to be having hallucination and psychotic behavior.  Appreciate psych reeval  yesterday ? ?Tobacco use disorder ?Continue nicotine patch ? ?Iron deficiency anemia due to chronic blood loss ?Also received iron transfusion, and MCV improving ? ?Syncope ?Patient cannot provide detailed information.  CT head negative.  Likely due to volume depletion/anemia secondary to GI bleeding ?-No further syncope or dizzy spells while in the hospital ? ? ? ? ? ?  ? ?Subjective: Not wanting to drink his prep.  Still tearful and some hallucination ? ?Physical Exam: ?Vitals:  ? 10/12/21 1508 10/12/21 1947 10/13/21 0448 10/13/21 0750  ?BP: 124/81 (!) 119/53 106/67 97/83  ?Pulse: (!) 49 (!) 51 (!) 52 61  ?Resp: '19 18 15 16  '$ ?Temp: 97.9 ?F (36.6 ?C) 98.3 ?F (36.8 ?C) 97.9 ?F (36.6 ?C) 97.7 ?F (36.5 ?C)  ?TempSrc:  Oral Oral   ?SpO2: 100% 100% 100% 100%  ?Weight:      ?Height:      ? ?? General:?Alert and oriented x2, no acute distress ?? HEENT: Normocephalic and atraumatic, mucous membranes are slightly dry ?? Cardiovascular:?Regular rhythm, borderline bradycardia ?? Respiratory:?Clear to auscultation bilaterally ?? Abdomen:?Soft, nontender, nondistended, positive bowel sounds ?? Musculoskeletal: No clubbing or cyanosis or edema ?? Skin:?No skin breaks, tears or lesions ?? Psychiatry:?Visual hallucination, crying easily ?? Neurology:?No focal deficits ? ?Data Reviewed: ? ?Hemoglobin 9.4 yesterday ? ?Family Communication: None ? ?Disposition: ?Status is: Inpatient ?Remains inpatient appropriate because: Getting surgery/right colectomy today ? ? Planned Discharge Destination: Home with Home Health ? ? ? DVT prophylaxis-SCDs ?Time spent: 35 minutes ? ?Author: ?Max Sane, MD ?10/13/2021 10:14 AM ? ?For on call review [email or web address].  ?

## 2021-10-13 NOTE — Progress Notes (Signed)
PT Cancellation Note ? ?Patient Details ?Name: Dustin [name] ?MRN: [id number] ?DOB: January 03, 1956 ? ? ?Cancelled Treatment:    Reason Eval/Treat Not Completed: Patient at procedure or test/unavailable.  PT consult received.  Chart reviewed.  Pt currently off floor for surgery.  Will re-attempt PT evaluation at a later date/time as medically appropriate. ? ?Leitha Bleak, PT ?10/13/21, 2:02 PM ? ?

## 2021-10-13 NOTE — Assessment & Plan Note (Signed)
Patient cannot provide detailed information.  CT head negative.  Likely due to volume depletion/anemia secondary to GI bleeding ?-No further syncope or dizzy spells while in the hospital ? ?

## 2021-10-13 NOTE — Progress Notes (Addendum)
[facility] ?SURGICAL PROGRESS NOTE  ? ?Hospital Day(s): 7.  ? ?Interval History: Patient seen and examined, no acute events or new complaints overnight. Issues with tolerating all his prep last night but is having loose watery bowel movements this morning. Patient reports he is sleepy today. No abdominal pain, nausea, emesis. He is not the most reliable historian secondary to history of significant psychiatric issues. No new labs this morning. He is NPO. Plan for laparoscopic right colectomy today with Dr Dahlia Byes.  ? ?Review of Systems:  ?Unable to reliably preform secondary to psychiatric history ? ?Vital signs in last 24 hours: [min-max] current  ?Temp:  [97.9 ?F (36.6 ?C)-98.4 ?F (36.9 ?C)] 97.9 ?F (36.6 ?C) (05/11 0448) ?Pulse Rate:  [49-73] 52 (05/11 0448) ?Resp:  [15-19] 15 (05/11 0448) ?BP: (101-124)/(53-81) 106/67 (05/11 0448) ?SpO2:  [97 %-100 %] 100 % (05/11 0448)     Height: '5\' 7"'$  (170.2 cm) Weight: 60.7 kg BMI (Calculated): 20.95  ? ?Intake/Output last 2 shifts:  ?05/10 0701 - 05/11 0700 ?In: 4 [P.O.:760] ?Out: 2700 [Urine:2700]  ? ?Physical Exam:  ?Constitutional: alert, cooperative and no distress  ?HENT: normocephalic without obvious abnormality  ?Eyes: PERRL, EOM's grossly intact and symmetric  ?Respiratory: breathing non-labored at rest  ?Cardiovascular: regular rate and sinus rhythm  ?Gastrointestinal: soft, non-tender, and non-distended, no rebound/guarding ?Musculoskeletal: no edema or wounds, motor and sensation grossly intact, NT  ? ? ?Labs:  ? ?  Latest Ref Rng & Units 10/12/2021  ?  4:45 AM 10/10/2021  ?  4:46 AM 10/08/2021  ?  7:13 AM  ?CBC  ?WBC 4.0 - 10.5 K/uL 7.5   8.5   6.8    ?Hemoglobin 13.0 - 17.0 g/dL 9.4   8.6   7.8    ?Hematocrit 39.0 - 52.0 % 34.6   31.3   28.9    ?Platelets 150 - 400 K/uL 302   324   312    ? ? ?  Latest Ref Rng & Units 10/12/2021  ?  4:45 AM 10/10/2021  ?  4:46 AM 10/07/2021  ?  4:12 AM  ?CMP  ?Glucose 70 - 99 mg/dL 93   92   110    ?BUN 8 - 23 mg/dL '24    19   13    '$ ?Creatinine 0.61 - 1.24 mg/dL 1.02   1.09   0.82    ?Sodium 135 - 145 mmol/L 138   141   136    ?Potassium 3.5 - 5.1 mmol/L 4.5   4.5   4.6    ?Chloride 98 - 111 mmol/L 109   107   108    ?CO2 22 - 32 mmol/L '25   29   24    '$ ?Calcium 8.9 - 10.3 mg/dL 8.8   8.9   8.6    ?Total Protein 6.5 - 8.1 g/dL 6.6      ?Total Bilirubin 0.3 - 1.2 mg/dL 0.4      ?Alkaline Phos 38 - 126 U/L 62      ?AST 15 - 41 U/L 20      ?ALT 0 - 44 U/L 15      ? ? ? ?Imaging studies: No new pertinent imaging studies ? ? ?Assessment/Plan:  ?66 y.o. male with ascending colon mass and, improved, anemia with plan for laparoscopic right colectomy with Dr Dahlia Byes on Thursday (51/76/1607), complicated by significant psychiatric issues. ? ? - NPO + IVF ?- Prophylactic Abx on call to OR (Cefotetan)  ?-  Plan for laparoscopic right colectomy today with Dr Dahlia Byes pending OR/Anesthesia availability. Dr Dahlia Byes has discussed procedure in detail with the patient's brother who is in agreement. Consent is signed and on chart ? - Monitor abdominal examination; on-going bowel function  ? - Pain control prn; antiemetic prn  ? - Mobilize as tolerated ?- Further management per primary service; we will follow   ? ?All of the above findings and recommendations were discussed with the patient, and the medical team. ? ?-- ?Edison Simon, PA-C ?[facility] ?10/13/2021, 7:29 AM ?M-F: 7am - 4pm ? ?

## 2021-10-13 NOTE — Assessment & Plan Note (Signed)
CEA level only at 6.  Biopsy samples on the note tubulovillous adenoma and not for adenocarcinoma of colon.  Cannot fully rule out malignancy without removing mass concerning for pathology.  Surgery planned for today ?

## 2021-10-13 NOTE — Op Note (Addendum)
PROCEDURES: ?1.  Right Hemicolectomy With stapled ileocolostomy ?2.  Repair of epigastric hernia 1.5cms ? ?Pre-operative Diagnosis: Right Colon mass ? ?Post-operative Diagnosis: Same ? ?Surgeon: Marjory Lies Nickolas Chalfin  ? ?Assistants: Gladstone Lighter RNFA Required due to the complexity of the case: for exposure and creation of the anastomosis ? ?Anesthesia: General endotracheal anesthesia ? ?ASA Class: 2 ? ?Surgeon: Caroleen Hamman , MD FACS ? ?Anesthesia: Gen. with endotracheal tube ? ? ?Findings: ?Right colon mass, no evidence of distant metastasis ?Tension free anastomosis, no evidence of intraop leak and good perfusion ?1.5 epigastric hernia ? ?Estimated Blood Loss: 20cc ?        ?Drains: none ?        ?Specimens: Right colon    ?      ?Complications: none ?        ? ?Procedure Details  ?The patient was seen again in the Holding Room. The benefits, complications, treatment options, and expected outcomes were discussed with the patient. The risks of bleeding, infection, recurrence of symptoms, failure to resolve symptoms,  bowel injury, any of which could require further surgery were reviewed with the patient.   The patient was taken to Operating Room, identified as [facility] and the procedure verified.  A Time Out was held and the above information confirmed. ? ?Prior to the induction of general anesthesia, antibiotic prophylaxis was administered. VTE prophylaxis was in place. General endotracheal anesthesia was then administered and tolerated well. After the induction, the abdomen was prepped with Chloraprep and draped in the sterile fashion. The patient was positioned in the supine position. ? ?7 cm incision was created as a midline mini laparotomy. The abdominal cavity was entered under direct visualization . An epigastric hernia was encountered and the sac was excised and defect cleaned. ?We noticed that given his anatomy there he had a very mobile colon, we started our dissection by dividing the greater omentum from the  Transverse colon, upon doing so, the splenic flexure was able to be visualized. I continued my dissection and felt that given his good anatomy I was going to be able to perform the colectomy via mini laparotomy incision. ?The greater omentum was divided and the hepatic flexure was taken down using harmonic scalpel.  The white line of Toldt was incised and a lateral to medial dissection was performed.  We identified the right ureter as well as the duodenum and preserve both structures at all times. ?Were also able to mobilize the attachments of the cecum and terminal ileum.  Once we had an adequate mobilization we were able to  exteriorized the right colon.  An ascending colon mass  5cc was felt., no evidence of distant metastasis was appreciated. A 10 cm margin on the terminal ileum was identified and we created a window with electrocautery and divided the terminal ileum.  Attention then was turned to the distal excision margin.  We identified the middle colic artery on selected a spot right to the middle colic artery.  Were able to also use a 75 GIA stapler to divide this area.  The mesentery was scored with electrocautery.  We identified the right colic artery and suture ligated with 2-0 silks in the standard fashion.  The rest of the mesentery was divided using the harmonic scalpel.  Please note that we went as low as possible to the base of the mesentery to obtain adequate lymph nodes and adequate margins of dissection. ?Specimen was passed and sent to permanent pathology.  A standard side-to-side  functional end to end staple anastomosis was created with multiple loads of a 75 GIA stapler device.  We check for patency as well as leak.  There was a tension-free anastomosis with good perfusion and no evidence of intraoperative leak.  The mesenteric defect was closed with a running 3-0 Vicryl in the standard fashion. ? ?We changed gloves and place a clean closure tray.   ?Liposomal Marcaine was injected throughout  the abdominal wall on both sides under direct visualization and palpation.  The fascia was closed with a running 0 PDS using the small bite techniques. The hernia defect was incorporated into our closure.  Incision was closed with staples and dressing placed.  Needle and laparotomy counts were correct and there were no immediate complications ? ? ? ? ?Caroleen Hamman, MD, FACS ?  ?

## 2021-10-13 NOTE — Progress Notes (Signed)
Initial Nutrition Assessment ? ?DOCUMENTATION CODES:  ? ?Not applicable ? ?INTERVENTION:  ? ?RD will monitor for diet advancement vs the need for nutrition support ? ?Pt at high refeed risk ? ?NUTRITION DIAGNOSIS:  ? ?Inadequate oral intake related to acute illness as evidenced by NPO status. ? ?GOAL:  ? ?Patient will meet greater than or equal to 90% of their needs ? ?MONITOR:  ? ?Diet advancement, Labs, Weight trends, Skin, I & O's ? ?REASON FOR ASSESSMENT:  ? ?NPO/Clear Liquid Diet ?  ? ?ASSESSMENT:  ? ?66 y.o. male with medical history significant of tobacco abuse, schizophrenia and tardive dyskinesia who is admitted with GIB. ? ?Pt s/p EGD/colonoscopy 5/4; pt found to have internal hemorrhoids, diverticulosis,  and colon mass.  ? ?Unable to see patient today as pt in surgery at time of RD visit. Per chart review, pt with good appetite and oral intake; pt eating 100% of meals in hospital. Pt has been intermittently on NPO/clear liquid diet for procedures throughout his admission. Pt NPO today for scheduled right colectomy. RD will monitor for diet advancement vs the need for nutrition support. Pt is at refeed risk. There is no weight history in chart to determine if any recent significant weight changes. Pt reports a 15-20lb weight loss over the past 6 months. Pt's UBW appears to be ~160lbs. RD will obtain nutrition related history and exam at follow-up.  ? ?Medications reviewed and include: ferrous sulfate, metronidazole, nicotine, protonix, cefotetan  ? ?Labs reviewed: K 4.5 wnl, BUN 24(H) ?Hgb 9.4(L), Hct 34.6(L), MCV 75.9(L), MCHC 27.2(L) ? ?NUTRITION - FOCUSED PHYSICAL EXAM: ?Unable to perform at this time  ? ?Diet Order:   ?Diet Order   ? ?       ?  Diet NPO time specified  Diet effective midnight       ?  ? ?  ?  ? ?  ? ?EDUCATION NEEDS:  ? ?Not appropriate for education at this time ? ?Skin:  Skin Assessment: Reviewed RN Assessment ? ?Last BM:  5/11- type 7 ? ?Height:  ? ?Ht Readings from Last 1  Encounters:  ?10/05/21 '5\' 7"'$  (1.702 m)  ? ? ?Weight:  ? ?Wt Readings from Last 1 Encounters:  ?10/05/21 60.7 kg  ? ? ?Ideal Body Weight:  67.2 kg ? ?BMI:  Body mass index is 20.96 kg/m?. ? ?Estimated Nutritional Needs:  ? ?Kcal:  1800-2100kcal/day ? ?Protein:  90-105g/day ? ?Fluid:  1.8-2.1L/day ? ?Koleen Distance MS, RD, LDN ?Please refer to [facility]ION for RD and/or RD on-call/weekend/after hours pager ? ?

## 2021-10-13 NOTE — Clinical Social Work Note (Signed)
?  Transition of Care (TOC) Screening Note ? ? ?Patient Details  ?Name: Dustin [name] ?Date of Birth: 03-11-1956 ? ? ?Transition of Care (TOC) CM/SW Contact:    ?Zelta Enfield A Yoshimi Sarr, LCSW ?Phone Number:[phone] ?10/13/2021, 9:49 AM ? ? ? ?Transition of Care Department [facility]) has reviewed patient and no TOC needs have been identified at this time. We will continue to monitor patient advancement through interdisciplinary progression rounds. If new patient transition needs arise, please place a TOC consult. ? ? ?

## 2021-10-13 NOTE — Assessment & Plan Note (Signed)
Pt is getting Invega and Holdal injection ?-IV ativan 0.5 mg every 6 as needed ?Patient appeared to be having hallucination and psychotic behavior.  Appreciate psych reeval yesterday ?

## 2021-10-13 NOTE — Care Management Important Message (Signed)
Important Message ? ?Patient Details  ?Name: Dustin [name] ?MRN: [id number] ?Date of Birth: 06/03/1956 ? ? ?Medicare Important Message Given:  Yes ? ?Patient out of room upon time of visit.  Copy of Medicare IM left in room for reference. ? ? ?Dannette Barbara ?10/13/2021, 2:56 PM ?

## 2021-10-13 NOTE — Assessment & Plan Note (Signed)
Status post 2 PRBC transfusion thus far cause is colonic mass and slow bleed over time.  Also received iron.  Hemoglobin stable at 9.4.   ? ?

## 2021-10-13 NOTE — Anesthesia Procedure Notes (Signed)
Procedure Name: Intubation ?Date/Time: 10/13/2021 2:25 PM ?Performed by: Lowry Bowl, CRNA ?Pre-anesthesia Checklist: Patient identified, Emergency Drugs available, Suction available and Patient being monitored ?Patient Re-evaluated:Patient Re-evaluated prior to induction ?Oxygen Delivery Method: Circle system utilized ?Preoxygenation: Pre-oxygenation with 100% oxygen ?Induction Type: IV induction and Cricoid Pressure applied ?Ventilation: Mask ventilation without difficulty ?Laryngoscope Size: McGraph and 4 ?Grade View: Grade I ?Tube type: Oral ?Tube size: 7.0 mm ?Number of attempts: 1 ?Airway Equipment and Method: Stylet and Video-laryngoscopy ?Placement Confirmation: ETT inserted through vocal cords under direct vision, positive ETCO2 and breath sounds checked- equal and bilateral ?Secured at: 21 cm ?Tube secured with: Tape ?Dental Injury: Teeth and Oropharynx as per pre-operative assessment  ? ? ? ? ?

## 2021-10-14 ENCOUNTER — Encounter: Payer: Self-pay | Admitting: Surgery

## 2021-10-14 DIAGNOSIS — D500 Iron deficiency anemia secondary to blood loss (chronic): Secondary | ICD-10-CM | POA: Diagnosis not present

## 2021-10-14 DIAGNOSIS — F250 Schizoaffective disorder, bipolar type: Secondary | ICD-10-CM | POA: Diagnosis not present

## 2021-10-14 DIAGNOSIS — D509 Iron deficiency anemia, unspecified: Secondary | ICD-10-CM | POA: Diagnosis not present

## 2021-10-14 DIAGNOSIS — F17200 Nicotine dependence, unspecified, uncomplicated: Secondary | ICD-10-CM | POA: Diagnosis not present

## 2021-10-14 DIAGNOSIS — K6389 Other specified diseases of intestine: Secondary | ICD-10-CM | POA: Diagnosis not present

## 2021-10-14 LAB — BASIC METABOLIC PANEL
Anion gap: 6 (ref 5–15)
BUN: 16 mg/dL (ref 8–23)
CO2: 24 mmol/L (ref 22–32)
Calcium: 9.1 mg/dL (ref 8.9–10.3)
Chloride: 106 mmol/L (ref 98–111)
Creatinine, Ser: 0.99 mg/dL (ref 0.61–1.24)
GFR, Estimated: >60 mL/min (ref >60)
Glucose, Bld: 97 mg/dL (ref 70–99)
Potassium: 5.1 mmol/L (ref 3.5–5.1)
Sodium: 136 mmol/L (ref 135–145)

## 2021-10-14 LAB — MAGNESIUM: Magnesium: 2.2 mg/dL (ref 1.7–2.4)

## 2021-10-14 LAB — CBC
HCT: 34 % — ABNORMAL LOW (ref 39.0–52.0)
Hemoglobin: 9.6 g/dL — ABNORMAL LOW (ref 13.0–17.0)
MCH: 21.1 pg — ABNORMAL LOW (ref 26.0–34.0)
MCHC: 28.2 g/dL — ABNORMAL LOW (ref 30.0–36.0)
MCV: 74.6 fL — ABNORMAL LOW (ref 80.0–100.0)
Platelets: 227 10*3/uL (ref 150–400)
RBC: 4.56 MIL/uL (ref 4.22–5.81)
RDW: 32 % — ABNORMAL HIGH (ref 11.5–15.5)
WBC: 13.2 10*3/uL — ABNORMAL HIGH (ref 4.0–10.5)
nRBC: 0 % (ref 0.0–0.2)

## 2021-10-14 MED ORDER — BOOST / RESOURCE BREEZE PO LIQD CUSTOM: 1.0000 | Freq: Three times a day (TID) | ORAL | Status: DC

## 2021-10-14 MED ORDER — ENSURE ENLIVE PO LIQD
237.0000 mL | Freq: Three times a day (TID) | ORAL | Status: DC
  Administered 2021-10-14 – 2021-10-17 (×7): 237 mL via ORAL

## 2021-10-14 MED ORDER — ADULT MULTIVITAMIN W/MINERALS CH
1.0000 | ORAL_TABLET | Freq: Every day | ORAL | Status: DC
  Administered 2021-10-15 – 2021-10-18 (×4): 1 via ORAL
  Filled 2021-10-14 (×5): qty 1

## 2021-10-14 NOTE — Evaluation (Signed)
Physical Therapy Evaluation ?Patient Details ?Name: Dustin [name] ?MRN: [id number] ?DOB: 05-03-1956 ?Today's Date: 10/14/2021 ? ?History of Present Illness ? Pt is a 66 y.o. male  presenting to hospital 5/3 with symptomatic anemia, rectal bleeding, fatigue, and dizziness; generalized weakness and balance issues; recent positive syncope episode few days prior.  Pt admitted with syncope, acute blood loss anemia, and colonic mass.  S/p colonoscopy and endoscopy 5/4.  S/p R hemicolectomy with stapled ileocolectomy with stapled ileocolostomy; also repair of epigastric hernia 1.5 cms on 10/13/21.   PMH includes schizophrenia, tobacco abuse, tardive dyskinesia.  ?Clinical Impression ? Prior to hospital admission, pt reports being independent with ambulation; lives alone in 1 level home with ramp to enter.  Pt reporting no pain during session and stated he did not want to talk about the surgery.  Pt tangential and talking softly during session.  Currently pt is modified independent with bed mobility; CGA with transfers using RW; and CGA ambulating 120 feet with RW use (vc's required to improve gait technique).  Pt would benefit from skilled PT to address noted impairments and functional limitations (see below for any additional details).  Upon hospital discharge, pt would benefit from [place] and 24/7 assist.   ? ?Recommendations for follow up therapy are one component of a multi-disciplinary discharge planning process, led by the attending physician.  Recommendations may be updated based on patient status, additional functional criteria and insurance authorization. ? ?Follow Up Recommendations Home health PT ? ?  ?Assistance Recommended at Discharge Frequent or constant Supervision/Assistance  ?Patient can return home with the following ? A little help with bathing/dressing/bathroom;Assistance with cooking/housework;Assist for transportation;Help with stairs or ramp for entrance;A little help with walking and/or transfers ? ?   ?Equipment Recommendations Rolling walker (2 wheels);BSC/3in1  ?Recommendations for Other Services ? OT consult  ?  ?Functional Status Assessment Patient has had a recent decline in their functional status and demonstrates the ability to make significant improvements in function in a reasonable and predictable amount of time.  ? ?  ?Precautions / Restrictions Precautions ?Precautions: Fall ?Precaution Comments: abdominal incision ?Restrictions ?Weight Bearing Restrictions: No  ? ?  ? ?Mobility ? Bed Mobility ?Overal bed mobility: Modified Independent ?  ?  ?  ?  ?  ?  ?General bed mobility comments: Semi-supine to sitting edge of bed ?  ? ?Transfers ?Overall transfer level: Needs assistance ?Equipment used: Rolling walker (2 wheels) ?Transfers: Sit to/from Stand ?Sit to Stand: Min guard ?  ?  ?  ?  ?  ?General transfer comment: increased effort and time to stand on own up to RW; vc's for UE placement when sitting down ?  ? ?Ambulation/Gait ?Ambulation/Gait assistance: Min guard ?Gait Distance (Feet): 120 Feet ?Assistive device: Rolling walker (2 wheels) ?  ?Gait velocity: decreased ?  ?  ?General Gait Details: pt with flexed posture walking (almost "bouncing" type movement in knees with walking but no knee buckling); vc's to stay closer to RW and for upright posture intermittently ? ?Stairs ?  ?  ?  ?  ?  ? ?Wheelchair Mobility ?  ? ?Modified Rankin (Stroke Patients Only) ?  ? ?  ? ?Balance Overall balance assessment: Needs assistance ?Sitting-balance support: No upper extremity supported, Feet supported ?Sitting balance-Leahy Scale: Good ?Sitting balance - Comments: steady sitting reaching within BOS ?  ?Standing balance support: Single extremity supported ?Standing balance-Leahy Scale: Fair ?Standing balance comment: steady static standing with at least single UE support ?  ?  ?  ?  ?  ?  ?  ?  ?  ?  ?  ?   ? ? ? ?  Pertinent Vitals/Pain Pain Assessment ?Pain Assessment: No/denies pain ?HR 93-111 bpm and O2 sats  98% or greater on room air during sessions activities.  ? ? ?Home Living Family/patient expects to be discharged to:: Private residence ?Living Arrangements: Alone ?Available Help at Discharge: Family;Available PRN/intermittently (pt's brother) ?Type of Home: Mobile home ?Home Access: Ramped entrance ?  ?  ?  ?Home Layout: One level ?Home Equipment: Shower seat;Cane - single point ?   ?  ?Prior Function Prior Level of Function : Independent/Modified Independent ?  ?  ?  ?  ?  ?  ?Mobility Comments: Modified independent (used cane as needed) for ambulation ?  ?  ? ? ?Hand Dominance  ?   ? ?  ?Extremity/Trunk Assessment  ? Upper Extremity Assessment ?Upper Extremity Assessment: Overall WFL for tasks assessed ?  ? ?Lower Extremity Assessment ?Lower Extremity Assessment: Generalized weakness ?  ? ?Cervical / Trunk Assessment ?Cervical / Trunk Assessment: Normal  ?Communication  ? Communication:  (pt tangential and often speaking softly requiring therapist to ask pt to repeat himself intermittently during session)  ?Cognition Arousal/Alertness: Awake/alert ?Behavior During Therapy: Flat affect ?Overall Cognitive Status: No family/caregiver present to determine baseline cognitive functioning ?  ?  ?  ?  ?  ?  ?  ?  ?  ?  ?  ?  ?  ?  ?  ?  ?General Comments: Oriented to at least person and place (pt did not want to talk about surgery) ?  ?  ? ?  ?General Comments General comments (skin integrity, edema, etc.): mild drainage noted abdominal incision.  Nursing cleared pt for participation in physical therapy.  Pt agreeable to PT session. ? ?  ?Exercises    ? ?Assessment/Plan  ?  ?PT Assessment Patient needs continued PT services  ?PT Problem List Decreased strength;Decreased activity tolerance;Decreased balance;Decreased mobility;Decreased knowledge of use of DME;Decreased knowledge of precautions;Pain;Decreased skin integrity ? ?   ?  ?PT Treatment Interventions DME instruction;Gait training;Functional mobility  training;Therapeutic activities;Therapeutic exercise;Balance training;Patient/family education   ? ?PT Goals (Current goals can be found in the Care Plan section)  ?Acute Rehab PT Goals ?Patient Stated Goal: to go home ?PT Goal Formulation: With patient ?Time For Goal Achievement: 10/28/21 ?Potential to Achieve Goals: Good ? ?  ?Frequency Min 2X/week ?  ? ? ?Co-evaluation   ?  ?  ?  ?  ? ? ?  ?AM-PAC PT "6 Clicks" Mobility  ?Outcome Measure Help needed turning from your back to your side while in a flat bed without using bedrails?: None ?Help needed moving from lying on your back to sitting on the side of a flat bed without using bedrails?: A Little ?Help needed moving to and from a bed to a chair (including a wheelchair)?: A Little ?Help needed standing up from a chair using your arms (e.g., wheelchair or bedside chair)?: A Little ?Help needed to walk in hospital room?: A Little ?Help needed climbing 3-5 steps with a railing? : A Little ?6 Click Score: 19 ? ?  ?End of Session Equipment Utilized During Treatment: Gait belt (up high away from incision) ?Activity Tolerance: Patient tolerated treatment well ?Patient left: in chair;with call bell/phone within reach;with chair alarm set ?Nurse Communication: Mobility status;Precautions ?PT Visit Diagnosis: Other abnormalities of gait and mobility (R26.89);Muscle weakness (generalized) (M62.81) ?  ? ?Time: 5643-3295 ?PT Time Calculation (min) (ACUTE ONLY): 24 min ? ? ?Charges:   PT Evaluation ?$PT Eval Low Complexity: 1 Low ?PT Treatments ?$Therapeutic Activity:  8-22 mins ?  ?   ? ?Leitha Bleak, PT ?10/14/21, 9:48 AM ? ? ?

## 2021-10-14 NOTE — Progress Notes (Signed)
Mobility Specialist - Progress Note ? ? 10/14/21 1642  ?Mobility  ?Activity Transferred to/from [facility]  ?Level of Assistance Standby assist, set-up cues, supervision of patient - no hands on  ?Assistive Device BSC  ?Distance Ambulated (ft) 2 ft  ?Activity Response Tolerated well  ?$Mobility charge 1 Mobility  ? ? ? ?Pt on BSC on arrival. Loose watery dark stool. Supervision to stand and perform peri-care. Pt returned supine. States he has "computers in his gums that control me" and "my brother can see you from my eyes". Pt left in bed with alarm set, needs in reach.  ? ? ?Kathee Delton ?Mobility Specialist ?10/14/21, 4:44 PM ? ? ? ? ?

## 2021-10-14 NOTE — Assessment & Plan Note (Signed)
Pt is getting Invega and Holdal injection ?-IV ativan 0.5 mg every 6 as needed ?Patient has intermittent hallucination and psychotic behavior at times ?

## 2021-10-14 NOTE — Assessment & Plan Note (Signed)
Continue nicotine patch  °

## 2021-10-14 NOTE — Assessment & Plan Note (Signed)
Continue iron sulfate. 

## 2021-10-14 NOTE — Assessment & Plan Note (Signed)
Status post 2 PRBC transfusion thus far cause is colonic mass and slow bleed over time.  Also received iron.  Hemoglobin stable at 9.6.   ? ?

## 2021-10-14 NOTE — TOC Progression Note (Signed)
Transition of Care (TOC) - Progression Note  ? ? ?Patient Details  ?Name: Dustin [name] ?MRN: [id number] ?Date of Birth: 10-07-1955 ? ?Transition of Care (TOC) CM/SW Contact  ?[name], RN ?Phone Number: ?10/14/2021, 2:33 PM ? ?Clinical Narrative:    ? ?Therapy recommending home health. Case discussed with [facility] supervisor.  Due to patient's current mental status and hallucinations will not discuss discharge disposition with him at this time.  Will follow up with patient when mental status improves.  If does not improve will have to follow up with psych  ? ? ?  ?  ? ?Expected Discharge Plan and Services ?  ?  ?  ?  ?  ?                ?  ?  ?  ?  ?  ?  ?  ?  ?  ?  ? ? ?Social Determinants of Health (SDOH) Interventions ?  ? ?Readmission Risk Interventions ?   ? View : No data to display.  ?  ?  ?  ? ? ?

## 2021-10-14 NOTE — Anesthesia Postprocedure Evaluation (Signed)
Anesthesia Post Note ? ?Patient: Dustin [name] ? ?Procedure(s) Performed: RIGHT COLECTOMY ? ?Patient location during evaluation: PACU ?Anesthesia Type: General ?Level of consciousness: awake and alert ?Pain management: pain level controlled ?Vital Signs Assessment: post-procedure vital signs reviewed and stable ?Respiratory status: spontaneous breathing, nonlabored ventilation and respiratory function stable ?Cardiovascular status: blood pressure returned to baseline and stable ?Postop Assessment: no apparent nausea or vomiting ?Anesthetic complications: no ? ? ?No notable events documented. ? ? ?Last Vitals:  ?Vitals:  ? 10/14/21 0747 10/14/21 1159  ?BP: 107/78 111/69  ?Pulse: (!) 55 62  ?Resp: 18 18  ?Temp: 36.9 ?C   ?SpO2: 100% 100%  ?  ?Last Pain:  ?Vitals:  ? 10/14/21 0747  ?TempSrc:   ?PainSc: 0-No pain  ? ? ?  ?  ?  ?  ?  ?  ? ?[place] Ouch ? ? ? ? ?

## 2021-10-14 NOTE — Assessment & Plan Note (Signed)
CEA level only at 6.  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  Full liquid diet and advance as tolerated. ?

## 2021-10-14 NOTE — Evaluation (Signed)
Occupational Therapy Evaluation ?Patient Details ?Name: Dustin [name] ?MRN: [id number] ?DOB: [birth year] ?Today's Date: 10/14/2021 ? ? ?History of Present Illness Pt is a 66 y.o. male  presenting to hospital 5/3 with symptomatic anemia, rectal bleeding, fatigue, and dizziness; generalized weakness and balance issues; recent positive syncope episode few days prior.  Pt admitted with syncope, acute blood loss anemia, and colonic mass.  S/p colonoscopy and endoscopy 5/4.  S/p R hemicolectomy with stapled ileocolectomy with stapled ileocolostomy; also repair of epigastric hernia 1.5 cms on 10/13/21.   PMH includes schizophrenia, tobacco abuse, tardive dyskinesia.  ? ?Clinical Impression ?  ?Chart reviewed pt greeted in chair agreeable to OT tx session. Pt is oriented to self only, tangential and poor awareness to deficits and sustained attention to task on this date. Per secure chat with team pt lives alone. PTA pt reports he performs ADL/IADL independently including driving and preparing meals. Pt performs STS with supervision with RW, amb in hallway with RW, toileting with MIN A, UB/LB dressing with SET UP. Pt is limited by current level of cognition and poor sustained attention to task. Pt is left in bed, all needs met, NAD. OT recommends discharge with [facility]. OT will follow acutely.  ?   ? ?Recommendations for follow up therapy are one component of a multi-disciplinary discharge planning process, led by the attending physician.  Recommendations may be updated based on patient status, additional functional criteria and insurance authorization.  ? ?Follow Up Recommendations ? Home Health OT  ?  ?Assistance Recommended at Discharge Frequent or constant Supervision/Assistance  ?Patient can return home with the following Assistance with cooking/housework;Direct supervision/assist for financial management;Direct supervision/assist for medications management;Assist for transportation ? ?  ?Functional Status Assessment ?  Patient has had a recent decline in their functional status and demonstrates the ability to make significant improvements in function in a reasonable and predictable amount of time.  ?Equipment Recommendations ? None recommended by OT  ?  ?Recommendations for Other Services   ? ? ?  ?Precautions / Restrictions Precautions ?Precautions: Fall ?Precaution Comments: abdominal incision ?Restrictions ?Weight Bearing Restrictions: No  ? ?  ? ?Mobility Bed Mobility ?Overal bed mobility: Modified Independent ?  ?  ?  ?  ?  ?  ?General bed mobility comments: sit>supine with HOB lowered ?  ? ?Transfers ?Overall transfer level: Needs assistance ?Equipment used: Rolling walker (2 wheels) ?Transfers: Sit to/from Stand ?Sit to Stand: Supervision ?  ?  ?  ?  ?  ?  ?  ? ?  ?Balance Overall balance assessment: Needs assistance ?Sitting-balance support: No upper extremity supported, Feet supported ?Sitting balance-Leahy Scale: Good ?  ?  ?Standing balance support: Single extremity supported ?Standing balance-Leahy Scale: Fair ?  ?  ?  ?  ?  ?  ?  ?  ?  ?  ?  ?  ?   ? ?ADL either performed or assessed with clinical judgement  ? ?ADL Overall ADL's : Needs assistance/impaired ?Eating/Feeding: Sitting;Set up ?Eating/Feeding Details (indicate cue type and reason): drinking per MD protocol ?  ?  ?  ?  ?  ?  ?Upper Body Dressing : Set up;Sitting ?Upper Body Dressing Details (indicate cue type and reason): gown ?Lower Body Dressing: Set up ?Lower Body Dressing Details (indicate cue type and reason): socks seated in chair ?Toilet Transfer: Supervision/safety;Ambulation;Rolling walker (2 wheels) ?Toilet Transfer Details (indicate cue type and reason): simulated to bedside chair, one vc for technique ?Toileting- Clothing Manipulation and Hygiene: Minimal assistance;Sit to/from  stand ?Toileting - Clothing Manipulation Details (indicate cue type and reason): peri care following BM ?  ?  ?Functional mobility during ADLs: Supervision/safety;Rolling  walker (2 wheels) (approx 100' with RW) ?   ? ? ? ?Vision   ?Additional Comments: will continue to assess, pt does not report  ?   ?Perception   ?  ?Praxis   ?  ? ?Pertinent Vitals/Pain Pain Assessment ?Pain Assessment: No/denies pain  ? ? ? ?Hand Dominance   ?  ?Extremity/Trunk Assessment Upper Extremity Assessment ?Upper Extremity Assessment: Overall WFL for tasks assessed ?  ?Lower Extremity Assessment ?Lower Extremity Assessment: Generalized weakness ?  ?Cervical / Trunk Assessment ?Cervical / Trunk Assessment: Normal ?  ?Communication Communication ?Communication: No difficulties ?  ?Cognition Arousal/Alertness: Awake/alert ?Behavior During Therapy: Flat affect ?Overall Cognitive Status: No family/caregiver present to determine baseline cognitive functioning ?Area of Impairment: Orientation, Attention, Memory, Following commands, Safety/judgement, Awareness, Problem solving ?  ?  ?  ?  ?  ?  ?  ?  ?Orientation Level: Disoriented to, Place, Time, Situation ?Current Attention Level: Focused ?Memory: Decreased recall of precautions, Decreased short-term memory ?Following Commands: Follows one step commands inconsistently ?Safety/Judgement: Decreased awareness of safety, Decreased awareness of deficits ?Awareness: Intellectual ?Problem Solving: Slow processing, Decreased initiation, Difficulty sequencing, Requires verbal cues, Requires tactile cues ?General Comments: Pt disorganized and tangental throughout evaluation. Pt stating "I have a child inside me" and "I hear through my teeth" ?  ?  ?General Comments  mild drainage noted abdominal incision ? ?  ?Exercises   ?  ?Shoulder Instructions    ? ? ?Home Living Family/patient expects to be discharged to:: Private residence ?Living Arrangements: Alone ?Available Help at Discharge: Family;Available PRN/intermittently ?Type of Home: Mobile home ?Home Access: Ramped entrance ?  ?  ?Home Layout: One level ?  ?  ?Bathroom Shower/Tub: Tub/shower unit ?  ?Bathroom Toilet:  Standard ?  ?  ?Home Equipment: Shower seat;Cane - single point ?  ?Additional Comments: pt provides this information, therapist reached out to team for confirmation ?  ? ?  ?Prior Functioning/Environment Prior Level of Function : Independent/Modified Independent ?  ?  ?  ?  ?  ?  ?Mobility Comments: pt reports MOD I with SPC for amb ?ADLs Comments: pt reports MOD I -I in all ADL, drives, cooks, cleans; ?  ? ?  ?  ?OT Problem List: Decreased strength;Decreased activity tolerance;Decreased safety awareness;Decreased knowledge of precautions;Decreased knowledge of use of DME or AE ?  ?   ?OT Treatment/Interventions: Self-care/ADL training;Patient/family education;Therapeutic exercise;DME and/or AE instruction;Therapeutic activities  ?  ?OT Goals(Current goals can be found in the care plan section) Acute Rehab OT Goals ?Patient Stated Goal: go home ?OT Goal Formulation: With patient ?Time For Goal Achievement: 10/28/21 ?Potential to Achieve Goals: Good  ?OT Frequency: Min 2X/week ?  ? ?Co-evaluation   ?  ?  ?  ?  ? ?  ?AM-PAC OT "6 Clicks" Daily Activity     ?Outcome Measure Help from another person eating meals?: None ?Help from another person taking care of personal grooming?: None ?Help from another person toileting, which includes using toliet, bedpan, or urinal?: A Little ?Help from another person bathing (including washing, rinsing, drying)?: A Little ?Help from another person to put on and taking off regular upper body clothing?: None ?Help from another person to put on and taking off regular lower body clothing?: None ?6 Click Score: 22 ?  ?End of Session Equipment Utilized During Treatment: Rolling walker (2  wheels) ?Nurse Communication: Mobility status ? ?Activity Tolerance: Patient tolerated treatment well ?Patient left: in bed;with call bell/phone within reach;with bed alarm set ? ?OT Visit Diagnosis: Unsteadiness on feet (R26.81);Muscle weakness (generalized) (M62.81)  ?              ?Time: 7322-5672 ?OT  Time Calculation (min): 22 min ?Charges:  OT General Charges ?$OT Visit: 1 Visit ?OT Evaluation ?$OT Eval Moderate Complexity: 1 Mod ?OT Treatments ?$Self Care/Home Management : 8-22 mins ? ?Shanon Payor, OT

## 2021-10-14 NOTE — Progress Notes (Signed)
Mobility Specialist - Progress Note ? ? 10/14/21 1600  ?Mobility  ?Activity Transferred to/from [facility]  ?Level of Assistance Standby assist, set-up cues, supervision of patient - no hands on  ?Assistive Device BSC  ?Distance Ambulated (ft) 2 ft  ?Activity Response Tolerated well  ?$Mobility charge 1 Mobility  ? ? ? ?Pt transferred to [facility] for BM. Left on [facility] with NT aware.  ? ? ?Kathee Delton ?Mobility Specialist ?10/14/21, 4:40 PM ? ? ? ? ?

## 2021-10-14 NOTE — Progress Notes (Signed)
[facility] ?SURGICAL PROGRESS NOTE ? ?Hospital Day(s): 8.  ? ?Post op day(s): 1 Day Post-Op.  ? ?Interval History:  ?Patient seen and examined ?No acute events or new complaints overnight.  ?Patient reports he is sleepy this morning ?He does not have many complaints this morning; psychiatric history makes him unreliable historian ?He does have a leukocytosis this morning; 13.2K - likely reactive from surgery ?Hgb stable at 9.6 ?BMP is grossly normal; no electrolyte derangements ?Urine output measured at 1660 ccs; foley in place post-operatively ?He is on CLD; seems to be tolerating ?Unclear is passing flatus; no BM ? ?Vital signs in last 24 hours: [min-max] current  ?Temp:  [97.1 ?F (36.2 ?C)-97.7 ?F (36.5 ?C)] 97.1 ?F (36.2 ?C) (05/12 0358) ?Pulse Rate:  [47-120] 52 (05/12 0358) ?Resp:  [8-25] 17 (05/12 0358) ?BP: (84-116)/(56-104) 89/62 (05/12 0358) ?SpO2:  [94 %-100 %] 100 % (05/12 0358)     Height: '5\' 7"'$  (170.2 cm) Weight: 60.7 kg BMI (Calculated): 20.95  ? ?Intake/Output last 2 shifts:  ?05/11 0701 - 05/12 0700 ?In: 2100 [I.V.:1900; IV Piggyback:200] ?Out: 1680 [Urine:1660; Blood:20]  ? ?Physical Exam:  ?Constitutional: alert, cooperative and no distress; resting in bed ?Respiratory: breathing non-labored at rest  ?Cardiovascular: regular rate and sinus rhythm  ?Gastrointestinal:  Soft, does not appear overtly tender, non-distended, no rebound/guarding ?Genitourinary: Foley in place ?Integumentary: Laparotomy is CDI with staples and honey comb; some drainage on dressing, no erythema appreciable  ? ?Labs:  ? ?  Latest Ref Rng & Units 10/14/2021  ?  5:43 AM 10/12/2021  ?  4:45 AM 10/10/2021  ?  4:46 AM  ?CBC  ?WBC 4.0 - 10.5 K/uL 13.2   7.5   8.5    ?Hemoglobin 13.0 - 17.0 g/dL 9.6   9.4   8.6    ?Hematocrit 39.0 - 52.0 % 34.0   34.6   31.3    ?Platelets 150 - 400 K/uL 227   302   324    ? ? ?  Latest Ref Rng & Units 10/14/2021  ?  5:43 AM 10/12/2021  ?  4:45 AM 10/10/2021  ?  4:46 AM  ?CMP  ?Glucose 70  - 99 mg/dL 97   93   92    ?BUN 8 - 23 mg/dL '16   24   19    '$ ?Creatinine 0.61 - 1.24 mg/dL 0.99   1.02   1.09    ?Sodium 135 - 145 mmol/L 136   138   141    ?Potassium 3.5 - 5.1 mmol/L 5.1   4.5   4.5    ?Chloride 98 - 111 mmol/L 106   109   107    ?CO2 22 - 32 mmol/L '24   25   29    '$ ?Calcium 8.9 - 10.3 mg/dL 9.1   8.8   8.9    ?Total Protein 6.5 - 8.1 g/dL  6.6     ?Total Bilirubin 0.3 - 1.2 mg/dL  0.4     ?Alkaline Phos 38 - 126 U/L  62     ?AST 15 - 41 U/L  20     ?ALT 0 - 44 U/L  15     ? ? ?Imaging studies: No new pertinent imaging studies ? ? ?Assessment/Plan: ?66 y.o. male 1 Day Post-Op s/p right colectomy for ascending colon mass, complicated by significant psychiatric issues. ? ? - Full liquid diet today ? - Continue IVF; wean as diet advances ?- Discontinue foley  catheter  ? - Complete perioperative Abx (Cefotetan) ?- Monitor abdominal examination; on-going bowel function  ? - Pain control prn; antiemetic prn ?- Monitor leukocytosis; likely reactive from surgery; will follow ?- Mobilize as tolerated; therapies engaged already ?- Further management per primary service; we will follow   ? ?All of the above findings and recommendations were discussed with the patient and the medical team. ? ?-- ?Edison Simon, PA-C ?[facility] ?10/14/2021, 7:37 AM ?M-F: 7am - 4pm  ?

## 2021-10-14 NOTE — Progress Notes (Signed)
? ?Hematology/Oncology Progress note ?[phone] [phone] ?  ? ? ?Patient Care Team: ?Jonetta Osgood, NP as PCP - General (Nurse Practitioner)  ? ?Name of the patient: Dustin [name]  ?[id number]  ?May 09, 1956  ?Date of visit: 10/14/21 ? ? ?INTERVAL HISTORY-  ? ?10/06/21 stomach biopsy showed chronic active gastritis with H Pylori, incidental gastric xanthoma.  ?Colon mass biopsy showed fragments of tubulovillous adenoma. Negative for high grade dyplasia and malignancy.  ? ?10/13/2021, status post right hemicolectomy with ileocolostomy.  Repair of epigastric hernia. ? ?Today is postoperation day 1, patient reports feeling well.  Denies any pain.  On full liquid diet. ? ? ? ? ?Current Facility-Administered Medications:  ?  acetaminophen (TYLENOL) tablet 1,000 mg, 1,000 mg, Oral, Q6H, Pabon, Diego F, MD, 1,000 mg at 10/14/21 1152 ?  bismuth subsalicylate (PEPTO BISMOL) chewable tablet 524 mg, 524 mg, Oral, Q6H, Pabon, Diego F, MD, 524 mg at 10/14/21 1614 ?  Chlorhexidine Gluconate Cloth 2 % PADS 6 each, 6 each, Topical, Daily, Max Sane, MD, 6 each at 10/13/21 2101 ?  feeding supplement (ENSURE ENLIVE / ENSURE PLUS) liquid 237 mL, 237 mL, Oral, TID BM, Manuella Ghazi, Vipul, MD, 237 mL at 10/14/21 1408 ?  ferrous sulfate tablet 325 mg, 325 mg, Oral, BID WC, Pabon, Diego F, MD, 325 mg at 10/14/21 1614 ?  haloperidol lactate (HALDOL) injection 1 mg, 1 mg, Intravenous, Q6H PRN, Pabon, Diego F, MD ?  ketorolac (TORADOL) 15 MG/ML injection 15 mg, 15 mg, Intravenous, Q6H, Pabon, Diego F, MD, 15 mg at 10/14/21 1152 ?  LORazepam (ATIVAN) injection 0.5 mg, 0.5 mg, Intravenous, Q6H, Pabon, Diego F, MD, 0.5 mg at 10/14/21 1152 ?  metroNIDAZOLE (FLAGYL) tablet 250 mg, 250 mg, Oral, Q6H, Pabon, Diego F, MD, 250 mg at 10/14/21 1156 ?  morphine (PF) 2 MG/ML injection 2 mg, 2 mg, Intravenous, Q4H PRN, Pabon, Diego F, MD ?  [START ON 10/15/2021] multivitamin with minerals tablet 1 tablet, 1 tablet, Oral, Daily, Manuella Ghazi, Vipul,  MD ?  nicotine (NICODERM CQ - dosed in mg/24 hours) patch 21 mg, 21 mg, Transdermal, Daily, Pabon, Diego F, MD, 21 mg at 10/14/21 0848 ?  ondansetron (ZOFRAN) injection 4 mg, 4 mg, Intravenous, Q8H PRN, Pabon, Diego F, MD ?  oxyCODONE (Oxy IR/ROXICODONE) immediate release tablet 5 mg, 5 mg, Oral, Q4H PRN, Pabon, Diego F, MD ?  pantoprazole (PROTONIX) EC tablet 40 mg, 40 mg, Oral, BID, Pabon, Diego F, MD, 40 mg at 10/14/21 0848 ?  senna (SENOKOT) tablet 8.6 mg, 1 tablet, Oral, Daily PRN, Pabon, Diego F, MD ?  tetracycline (SUMYCIN) capsule 500 mg, 500 mg, Oral, QID, Pabon, Diego F, MD, 500 mg at 10/14/21 1409 ? ? ?Physical exam:  ?Vitals:  ? 10/14/21 0358 10/14/21 0747 10/14/21 1159 10/14/21 1552  ?BP: (!) 89/62 107/78 111/69 (!) 82/64  ?Pulse: (!) 52 (!) 55 62 66  ?Resp: '17 18 18 18  '$ ?Temp: (!) 97.1 ?F (36.2 ?C) 98.4 ?F (36.9 ?C)  98.7 ?F (37.1 ?C)  ?TempSrc:      ?SpO2: 100% 100% 100% 100%  ?Weight:      ?Height:      ? ?Physical Exam ?Constitutional:   ?   General: He is not in acute distress. ?   Appearance: He is not diaphoretic.  ?HENT:  ?   Head: Normocephalic and atraumatic.  ?   Mouth/Throat:  ?   Pharynx: No oropharyngeal exudate.  ?Eyes:  ?   General: No scleral icterus. ?Cardiovascular:  ?  Rate and Rhythm: Normal rate and regular rhythm.  ?   Heart sounds: No murmur heard. ?Pulmonary:  ?   Effort: Pulmonary effort is normal. No respiratory distress.  ?   Breath sounds: No rales.  ?Chest:  ?   Chest wall: No tenderness.  ?Abdominal:  ?   General: There is no distension.  ?   Palpations: Abdomen is soft.  ?   Comments: Laparotomy site covered with dressing.  ?Musculoskeletal:     ?   General: Normal range of motion.  ?   Cervical back: Normal range of motion and neck supple.  ?Skin: ?   General: Skin is warm and dry.  ?   Findings: No erythema.  ?Neurological:  ?   Mental Status: He is alert. Mental status is at baseline.  ?   Cranial Nerves: No cranial nerve deficit.  ?   Motor: No abnormal muscle tone.   ?Psychiatric:     ?   Mood and Affect: Affect normal.  ?  ? ? ? ? ?  Latest Ref Rng & Units 10/14/2021  ?  5:43 AM  ?CMP  ?Glucose 70 - 99 mg/dL 97    ?BUN 8 - 23 mg/dL 16    ?Creatinine 0.61 - 1.24 mg/dL 0.99    ?Sodium 135 - 145 mmol/L 136    ?Potassium 3.5 - 5.1 mmol/L 5.1    ?Chloride 98 - 111 mmol/L 106    ?CO2 22 - 32 mmol/L 24    ?Calcium 8.9 - 10.3 mg/dL 9.1    ? ? ?  Latest Ref Rng & Units 10/14/2021  ?  5:43 AM  ?CBC  ?WBC 4.0 - 10.5 K/uL 13.2    ?Hemoglobin 13.0 - 17.0 g/dL 9.6    ?Hematocrit 39.0 - 52.0 % 34.0    ?Platelets 150 - 400 K/uL 227    ? ? ?RADIOGRAPHIC STUDIES: ?I have personally reviewed the radiological images as listed and agreed with the findings in the report. ?DG Chest 2 View ? ?Result Date: 10/03/2021 ?CLINICAL DATA:  Unintentional weight loss, tobacco abuse EXAM: CHEST - 2 VIEW COMPARISON:  None. FINDINGS: Frontal and lateral views of the chest demonstrate an unremarkable cardiac silhouette. The lungs are hyperinflated with background interstitial prominence consistent with emphysema. No airspace disease, effusion, or pneumothorax. There are no acute bony abnormalities. IMPRESSION: 1. Emphysema.  No acute intrathoracic process. Electronically Signed   By: Randa Ngo M.D.   On: 10/03/2021 17:17  ? ?CT HEAD WO CONTRAST (5MM) ? ?Result Date: 10/05/2021 ?CLINICAL DATA:  Altered mental status EXAM: CT HEAD WITHOUT CONTRAST TECHNIQUE: Contiguous axial images were obtained from the base of the skull through the vertex without intravenous contrast. RADIATION DOSE REDUCTION: This exam was performed according to the departmental dose-optimization program which includes automated exposure control, adjustment of the mA and/or kV according to patient size and/or use of iterative reconstruction technique. COMPARISON:  None Available. FINDINGS: Brain: No acute territorial infarction, hemorrhage or intracranial mass. Mild atrophy. Nonenlarged ventricles. Vascular: No hyperdense vessels.  Carotid  vascular calcification. Skull: Normal. Negative for fracture or focal lesion. Small defects within the parietal bones posteriorly. Sinuses/Orbits: No acute finding. Other: None IMPRESSION: 1. No CT evidence for acute intracranial abnormality. 2. Mild atrophy. Electronically Signed   By: Donavan Foil M.D.   On: 10/05/2021 17:44  ? ?CT CHEST ABDOMEN PELVIS W CONTRAST ? ?Result Date: 10/09/2021 ?CLINICAL DATA:  66 year old male with unintended weight loss and recent colonoscopy demonstrating a partially obstructing  ascending colonic mass with biopsies demonstrating tubular villous adenoma. EXAM: CT CHEST, ABDOMEN, AND PELVIS WITH CONTRAST TECHNIQUE: Multidetector CT imaging of the chest, abdomen and pelvis was performed following the standard protocol during bolus administration of intravenous contrast. RADIATION DOSE REDUCTION: This exam was performed according to the departmental dose-optimization program which includes automated exposure control, adjustment of the mA and/or kV according to patient size and/or use of iterative reconstruction technique. CONTRAST:  13m OMNIPAQUE IOHEXOL 300 MG/ML  SOLN COMPARISON:  06/14/2005 abdomen and pelvis CT FINDINGS: CT CHEST FINDINGS Cardiovascular: Heart size is normal. Heavy coronary artery atherosclerotic calcifications are present. Mild aortic atherosclerotic calcifications noted without thoracic aortic aneurysm. No pericardial effusion is identified. Mediastinum/Nodes: No enlarged mediastinal, hilar, or axillary lymph nodes. Thyroid gland, trachea, and esophagus demonstrate no significant findings. Lungs/Pleura: Moderate emphysema is noted, primarily centrilobular in greatest in the UPPER lungs. There is no evidence of airspace disease, mass, suspicious nodule, consolidation, pleural effusion or pneumothorax. Mild dependent opacities are noted, greatest in the LOWER lobes and LEFT greater than RIGHT, question atelectasis versus interstitial lung disease. Musculoskeletal:  No acute or suspicious bony abnormalities are noted. CT ABDOMEN PELVIS FINDINGS Hepatobiliary: The liver and gallbladder are unremarkable. There is no evidence of intrahepatic or extrahepatic biliary dilatat

## 2021-10-14 NOTE — Progress Notes (Signed)
?Progress Note ? ? ?Patient: Dustin [name] JME:[id number] DOB: 04/18/1956 DOA: 10/05/2021     8 ?DOS: the patient was seen and examined on 10/14/2021 ?  ?Brief hospital course: ?66 year old male with past medical history of tobacco abuse, schizophrenia and tardive dyskinesia who presented to the emergency room after being redirected there by his PCP.  2 days prior, blood work came back with a hemoglobin of 6.2 and an MCV of 64.  In addition, patient notes a 15 to 20 pound weight loss over the last 6 months.  Patient also relates a 1 week history of pharyngeal esophageal dysphagia.  Patient was transfused 1 unit packed red blood cells and brought in for further evaluation to the hospitalist service.  GI was consulted and took patient for EGD and colonoscopy on 5/4.  Endoscopy unremarkable, but colonoscopy revealed incidental diverticula and a partially obstructing colonic mass in the ascending colon.  Interestingly, biopsies note tubulovillous adenoma and not adenocarcinoma of the colon.  General surgery consulted and plans to take patient for removal of colon mass on Thursday, 5/11.  Psychiatry consulted for psychosis behavior. ? ?5/9: wants to eat, surgery on 5/11 ?5/10: Psych reeval for schizophrenia has patient is hallucinating and worried about surgery ?5/11: Right colectomy for colon mass ?5/12: POD 1 status post right colectomy for ascending colon mass.  Full liquid diet started, discontinue Foley catheter ? ? ?Assessment and Plan: ?* Colonic mass ?CEA level only at 6.  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  Full liquid diet and advance as tolerated. ? ?Microcytic anemia ?Status post 2 PRBC transfusion thus far cause is colonic mass and slow bleed over time.  Also received iron.  Hemoglobin stable at 9.6.   ? ? ?Schizoaffective disorder, bipolar type ([place]) ?Pt is getting Invega and Holdal injection ?-IV ativan  0.5 mg every 6 as needed ?Patient has intermittent hallucination and psychotic behavior at times ? ?Tobacco use disorder ?Continue nicotine patch. ? ?Iron deficiency anemia due to chronic blood loss ?Continue iron sulfate ? ?Syncope ?Patient cannot provide detailed information.  CT head negative.  Likely due to volume depletion/anemia secondary to GI bleeding ?-No further syncope or dizzy spells while in the hospital. ? ? ? ? ? ?  ? ?Subjective: Requesting Jello/food to eat ? ?Physical Exam: ?Vitals:  ? 10/13/21 2059 10/14/21 0358 10/14/21 0747 10/14/21 1159  ?BP: (!) 116/104 (!) 89/62 107/78 111/69  ?Pulse: 69 (!) 52 (!) 55 62  ?Resp: '17 17 18 18  '$ ?Temp: 97.7 ?F (36.5 ?C) (!) 97.1 ?F (36.2 ?C) 98.4 ?F (36.9 ?C)   ?TempSrc:      ?SpO2: 100% 100% 100% 100%  ?Weight:      ?Height:      ? ?? General:?Alert and oriented x2, no acute distress ?? HEENT: Normocephalic and atraumatic, mucous membranes are slightly dry ?? Cardiovascular:?Regular rhythm, borderline bradycardia ?? Respiratory:?Clear to auscultation bilaterally ?? Abdomen:?Soft, laparotomy incision with some drainage on dressing.  No signs of infection ?? Skin:?No skin breaks, tears or lesions ?? Psychiatry:?Awake, nonfocal ?? Neurology:?No focal deficits ??  ?Data Reviewed: ? ?There are no new results to review at this time. ? ?Family Communication: Discussed with his brother over phone ? ?Disposition: ?Status is: Inpatient ?Remains inpatient appropriate because: Had surgery yesterday will take postop recovery time and will need to find a place for him to go as he lived alone before coming to the  hospital and his brother is not too sure if he can take care of him ? ? Planned Discharge Destination: Skilled nursing facility ? ? ? DVT prophylaxis-SCDs ?Time spent: 35 minutes ? ?Author: ?Max Sane, MD ?10/14/2021 12:36 PM ? ?For on call review [email or web address].  ?

## 2021-10-14 NOTE — Assessment & Plan Note (Signed)
Patient cannot provide detailed information.  CT head negative.  Likely due to volume depletion/anemia secondary to GI bleeding ?-No further syncope or dizzy spells while in the hospital. ? ?

## 2021-10-15 DIAGNOSIS — D509 Iron deficiency anemia, unspecified: Secondary | ICD-10-CM | POA: Diagnosis not present

## 2021-10-15 DIAGNOSIS — F250 Schizoaffective disorder, bipolar type: Secondary | ICD-10-CM | POA: Diagnosis not present

## 2021-10-15 DIAGNOSIS — K6389 Other specified diseases of intestine: Secondary | ICD-10-CM | POA: Diagnosis not present

## 2021-10-15 DIAGNOSIS — F17200 Nicotine dependence, unspecified, uncomplicated: Secondary | ICD-10-CM | POA: Diagnosis not present

## 2021-10-15 LAB — BASIC METABOLIC PANEL
Anion gap: 2 — ABNORMAL LOW (ref 5–15)
BUN: 18 mg/dL (ref 8–23)
CO2: 26 mmol/L (ref 22–32)
Calcium: 9 mg/dL (ref 8.9–10.3)
Chloride: 109 mmol/L (ref 98–111)
Creatinine, Ser: 1.06 mg/dL (ref 0.61–1.24)
GFR, Estimated: >60 mL/min (ref >60)
Glucose, Bld: 100 mg/dL — ABNORMAL HIGH (ref 70–99)
Potassium: 4.5 mmol/L (ref 3.5–5.1)
Sodium: 137 mmol/L (ref 135–145)

## 2021-10-15 LAB — CBC
HCT: 33 % — ABNORMAL LOW (ref 39.0–52.0)
Hemoglobin: 9.2 g/dL — ABNORMAL LOW (ref 13.0–17.0)
MCH: 21.1 pg — ABNORMAL LOW (ref 26.0–34.0)
MCHC: 27.9 g/dL — ABNORMAL LOW (ref 30.0–36.0)
MCV: 75.9 fL — ABNORMAL LOW (ref 80.0–100.0)
Platelets: 221 10*3/uL (ref 150–400)
RBC: 4.35 MIL/uL (ref 4.22–5.81)
RDW: 32.6 % — ABNORMAL HIGH (ref 11.5–15.5)
WBC: 7 10*3/uL (ref 4.0–10.5)
nRBC: 0 % (ref 0.0–0.2)

## 2021-10-15 MED ORDER — ENOXAPARIN SODIUM 40 MG/0.4ML IJ SOSY
40.0000 mg | PREFILLED_SYRINGE | INTRAMUSCULAR | Status: DC
  Administered 2021-10-15 – 2021-10-17 (×3): 40 mg via SUBCUTANEOUS
  Filled 2021-10-15 (×3): qty 0.4

## 2021-10-15 MED ORDER — LORAZEPAM 0.5 MG PO TABS
0.5000 mg | ORAL_TABLET | Freq: Four times a day (QID) | ORAL | Status: DC
  Administered 2021-10-15 – 2021-10-16 (×4): 0.5 mg via ORAL
  Filled 2021-10-15 (×4): qty 1

## 2021-10-15 NOTE — Progress Notes (Signed)
?Progress Note ? ? ?Patient: Dustin [name] LNL:[id number] DOB: 11/01/1955 DOA: 10/05/2021     9 ?DOS: the patient was seen and examined on 10/15/2021 ?  ?Brief hospital course: ?66 year old male with past medical history of tobacco abuse, schizophrenia and tardive dyskinesia who presented to the emergency room after being redirected there by his PCP.  2 days prior, blood work came back with a hemoglobin of 6.2 and an MCV of 64.  In addition, patient notes a 15 to 20 pound weight loss over the last 6 months.  Patient also relates a 1 week history of pharyngeal esophageal dysphagia.  Patient was transfused 1 unit packed red blood cells and brought in for further evaluation to the hospitalist service.  GI was consulted and took patient for EGD and colonoscopy on 5/4.  Endoscopy unremarkable, but colonoscopy revealed incidental diverticula and a partially obstructing colonic mass in the ascending colon.  Interestingly, biopsies note tubulovillous adenoma and not adenocarcinoma of the colon.  General surgery consulted and plans to take patient for removal of colon mass on Thursday, 5/11.  Psychiatry consulted for psychosis behavior. ? ?5/9: wants to eat, surgery on 5/11 ?5/10: Psych reeval for schizophrenia has patient is hallucinating and worried about surgery ?5/11: Right colectomy for colon mass ?5/12: POD 1 status post right colectomy for ascending colon mass.  Full liquid diet started, discontinue Foley catheter ?5/13: Advance diet, encourage mobility ? ? ?Assessment and Plan: ?* Colonic mass ?CEA level only at 6.  Biopsy results were nondiagnostic. ?S/p right colectomy for ascending colon mass on 5/11.  Full liquid diet tolerated-we will advance to regular diet today ?Pathology is pending.  Outpatient follow-up at cancer center ? ?Microcytic anemia ?Status post 2 PRBC transfusion thus far cause is colonic mass and slow bleed over time.  Also received iron.  Hemoglobin stable at 9.2   ? ? ?Schizoaffective disorder,  bipolar type ([place]) ?Pt is getting Invega and Holdal injection as needed ?-Switch IV to p.o. Ativan 0.5 mg every 6 hours ?Patient has intermittent hallucination and psychotic behavior at times.  Seem to be slowly improving  ? ?Tobacco use disorder ?Continue nicotine patch. ? ?Iron deficiency anemia due to chronic blood loss ?Continue iron sulfate ? ?Syncope ?Patient cannot provide detailed information.  CT head negative.  Likely due to volume depletion/anemia secondary to GI bleeding ?-No further syncope or dizzy spells while in the hospital. ? ? ? ? ? ?  ? ?Subjective: Denies any pain or new complaints.  Less hallucination.  Seem to be agreeable for mobility and wanting to go home ? ?Physical Exam: ?Vitals:  ? 10/14/21 1552 10/14/21 1715 10/14/21 2008 10/15/21 0448  ?BP: (!) 82/64 102/67 114/60 93/66  ?Pulse: 66  (!) 50 68  ?Resp: '18  17 17  '$ ?Temp: 98.7 ?F (37.1 ?C)  98.1 ?F (36.7 ?C) 97.6 ?F (36.4 ?C)  ?TempSrc:      ?SpO2: 100%  100% 100%  ?Weight:      ?Height:      ? ?? General:?Alert and oriented x2, no acute distress ?? HEENT: Normocephalic and atraumatic, mucous membranes are slightly dry ?? Cardiovascular:?Regular rhythm, borderline bradycardia ?? Respiratory:?Clear to auscultation bilaterally ?? Abdomen:?Soft, laparotomy incision with some drainage on dressing.  No signs of infection ?? Skin:?No skin breaks, tears or lesions ?? Psychiatry:?Awake, nonfocal ?? Neurology:?No focal deficits ? ?Data Reviewed: ? ?Hemoglobin 9.2 ? ?Family Communication: Brother was updated yesterday ? ?Disposition: ?Status is: Inpatient ?Remains inpatient appropriate because: Postop recovery monitoring of mental  health and or acute psychosis. ? ? Planned Discharge Destination: Home with Home Health ? ? ? DVT prophylaxis-SCDs  ?Time spent: 35 minutes ? ?Author: ?Max Sane, MD ?10/15/2021 12:00 PM ? ?For on call review [email or web address].  ?

## 2021-10-15 NOTE — Assessment & Plan Note (Addendum)
Pt is getting Invega and Holdal injection as needed ?-Switch IV to p.o. Ativan 0.5 mg every 6 hours ?Patient has intermittent hallucination and psychotic behavior at times.  Seem to be slowly improving  ?

## 2021-10-15 NOTE — Assessment & Plan Note (Signed)
Patient cannot provide detailed information.  CT head negative.  Likely due to volume depletion/anemia secondary to GI bleeding ?-No further syncope or dizzy spells while in the hospital. ? ?

## 2021-10-15 NOTE — Progress Notes (Signed)
Mobility Specialist - Progress Note ? ? 10/15/21 1400  ?Mobility  ?Activity Ambulated with assistance in room  ?Level of Assistance Standby assist, set-up cues, supervision of patient - no hands on  ?Assistive Device Front wheel walker  ?Distance Ambulated (ft) 240 ft  ?Activity Response Tolerated well  ?$Mobility charge 1 Mobility  ? ? ? ?Ambulated in hallway with supervision. Pt left in bed with alarm set, needs in reach.  ? ? ?Kathee Delton ?Mobility Specialist ?10/15/21, 2:17 PM ? ? ? ? ?

## 2021-10-15 NOTE — Assessment & Plan Note (Addendum)
Status post 2 PRBC transfusion thus far cause is colonic mass and slow bleed over time.  Also received iron.  Hemoglobin stable at 9.2   ? ?

## 2021-10-15 NOTE — Assessment & Plan Note (Signed)
Continue iron sulfate. 

## 2021-10-15 NOTE — Assessment & Plan Note (Signed)
CEA level only at 6.  Biopsy results were nondiagnostic. ?S/p right colectomy for ascending colon mass on 5/11.  Full liquid diet tolerated-we will advance to regular diet today ?Pathology is pending.  Outpatient follow-up at cancer center ?

## 2021-10-15 NOTE — Plan of Care (Signed)

## 2021-10-15 NOTE — Assessment & Plan Note (Signed)
Continue nicotine patch  °

## 2021-10-15 NOTE — Progress Notes (Signed)
Patient ID: Dustin [name], male   DOB: Dec 18, 1955, 66 y.o.   MRN: [id number] ?    SURGICAL PROGRESS NOTE  ? ?Hospital Day(s): 9.  ? ?Interval History: Patient seen and examined, no acute events or new complaints overnight. Patient was sleeping.  Even when moving to check on the wounds he did not want to cooperate to check the incisions.  Last Ativan given at 6 AM.  Last bowel movement reported on 10/13/2021. ? ?Vital signs in last 24 hours: [min-max] current  ?Temp:  [97.6 ?F (36.4 ?C)-98.7 ?F (37.1 ?C)] 97.6 ?F (36.4 ?C) (05/13 0448) ?Pulse Rate:  [50-68] 68 (05/13 0448) ?Resp:  [17-18] 17 (05/13 0448) ?BP: (82-114)/(60-69) 93/66 (05/13 0448) ?SpO2:  [100 %] 100 % (05/13 0448)     Height: '5\' 7"'$  (170.2 cm) Weight: 60.7 kg BMI (Calculated): 20.95  ? ?Physical Exam:  ?Constitutional: Sleepy, not cooperative ?Respiratory: breathing non-labored at rest  ?Cardiovascular: regular rate and sinus rhythm  ?Gastrointestinal: soft, non-tender, and non-distended.  Wound Meriel Flavors ? ?Labs:  ? ?  Latest Ref Rng & Units 10/15/2021  ?  5:23 AM 10/14/2021  ?  5:43 AM 10/12/2021  ?  4:45 AM  ?CBC  ?WBC 4.0 - 10.5 K/uL 7.0   13.2   7.5    ?Hemoglobin 13.0 - 17.0 g/dL 9.2   9.6   9.4    ?Hematocrit 39.0 - 52.0 % 33.0   34.0   34.6    ?Platelets 150 - 400 K/uL 221   227   302    ? ? ?  Latest Ref Rng & Units 10/15/2021  ?  5:23 AM 10/14/2021  ?  5:43 AM 10/12/2021  ?  4:45 AM  ?CMP  ?Glucose 70 - 99 mg/dL 100   97   93    ?BUN 8 - 23 mg/dL '18   16   24    '$ ?Creatinine 0.61 - 1.24 mg/dL 1.06   0.99   1.02    ?Sodium 135 - 145 mmol/L 137   136   138    ?Potassium 3.5 - 5.1 mmol/L 4.5   5.1   4.5    ?Chloride 98 - 111 mmol/L 109   106   109    ?CO2 22 - 32 mmol/L '26   24   25    '$ ?Calcium 8.9 - 10.3 mg/dL 9.0   9.1   8.8    ?Total Protein 6.5 - 8.1 g/dL   6.6    ?Total Bilirubin 0.3 - 1.2 mg/dL   0.4    ?Alkaline Phos 38 - 126 U/L   62    ?AST 15 - 41 U/L   20    ?ALT 0 - 44 U/L   15    ? ? ?Imaging studies: No new pertinent imaging  studies ? ? ?Assessment/Plan:  ?66 y.o. male with obstructing ascending colon mass 2 Days Post-Op s/p right hemicolectomy, complicated by pertinent comorbidities including smoker, schizophrenia, tardive dyskinesia. ? ?-No sign of clinical deterioration ?-Stable vital signs, no fever ?-No new documentation of bowel movement or gas.  OK to advance diet as tolerated.  ?-White blood cell within normal limits.  Stable hemoglobin.  No significant electrolyte disturbance. ?-No sign of complication at this moment ?-Will continue to follow closely.  ? ?Gwendolyn Grant, MD ? ? ? ?

## 2021-10-15 NOTE — Progress Notes (Addendum)
Mobility Specialist - Progress Note ? ? 10/15/21 1000  ?Mobility  ?Activity Ambulated with assistance in hallway  ?Level of Assistance Standby assist, set-up cues, supervision of patient - no hands on  ?Assistive Device Front wheel walker  ?Distance Ambulated (ft) 240 ft  ?Activity Response Tolerated well  ?$Mobility charge 1 Mobility  ? ? ? ?Pt semi-supine in bed upon arrival, utilizing RA. Pt ambulated in hallway with supervision. Consistent cueing to correct flexed posture, but pt often just responds "you have to talk to my tonsils and tell them to shut the hell up loud enough so they can hear you". Pt returned to bed with alarm sedt, needs in reach.  ? ? ?Kathee Delton ?Mobility Specialist ?10/15/21, 10:49 AM ? ? ? ? ?

## 2021-10-16 DIAGNOSIS — F17200 Nicotine dependence, unspecified, uncomplicated: Secondary | ICD-10-CM | POA: Diagnosis not present

## 2021-10-16 DIAGNOSIS — D509 Iron deficiency anemia, unspecified: Secondary | ICD-10-CM | POA: Diagnosis not present

## 2021-10-16 DIAGNOSIS — K6389 Other specified diseases of intestine: Secondary | ICD-10-CM | POA: Diagnosis not present

## 2021-10-16 DIAGNOSIS — F250 Schizoaffective disorder, bipolar type: Secondary | ICD-10-CM | POA: Diagnosis not present

## 2021-10-16 LAB — GLUCOSE, CAPILLARY: Glucose-Capillary: 135 mg/dL — ABNORMAL HIGH (ref 70–99)

## 2021-10-16 MED ORDER — POLYETHYLENE GLYCOL 3350 17 G PO PACK
17.0000 g | PACK | Freq: Every day | ORAL | Status: DC
  Administered 2021-10-16 – 2021-10-18 (×2): 17 g via ORAL
  Filled 2021-10-16 (×4): qty 1

## 2021-10-16 MED ORDER — SENNOSIDES-DOCUSATE SODIUM 8.6-50 MG PO TABS
2.0000 | ORAL_TABLET | Freq: Two times a day (BID) | ORAL | Status: DC
Start: 2021-10-16 — End: 2021-10-18
  Administered 2021-10-16 – 2021-10-18 (×4): 2 via ORAL
  Filled 2021-10-16 (×4): qty 2

## 2021-10-16 NOTE — Progress Notes (Addendum)
?Progress Note ? ? ?Patient: Dustin [name] JJH:[id number] DOB: 09-12-1955 DOA: 10/05/2021     10 ?DOS: the patient was seen and examined on 10/16/2021 ?  ?Brief hospital course: ?66 year old male with past medical history of tobacco abuse, schizophrenia and tardive dyskinesia who presented to the emergency room after being redirected there by his PCP.  2 days prior, blood work came back with a hemoglobin of 6.2 and an MCV of 64.  In addition, patient notes a 15 to 20 pound weight loss over the last 6 months.  Patient also relates a 1 week history of pharyngeal esophageal dysphagia.  Patient was transfused 1 unit packed red blood cells and brought in for further evaluation to the hospitalist service.  GI was consulted and took patient for EGD and colonoscopy on 5/4.  Endoscopy unremarkable, but colonoscopy revealed incidental diverticula and a partially obstructing colonic mass in the ascending colon.  Interestingly, biopsies note tubulovillous adenoma and not adenocarcinoma of the colon.  General surgery consulted and plans to take patient for removal of colon mass on Thursday, 5/11.  Psychiatry consulted for psychosis behavior. ? ?5/9: wants to eat, surgery on 5/11 ?5/10: Psych reeval for schizophrenia has patient is hallucinating and worried about surgery ?5/11: Right colectomy for colon mass ?5/12: POD 1 status post right colectomy for ascending colon mass.  Full liquid diet started, discontinue Foley catheter ?5/13: Advance diet, encourage mobility ?5/14: No bowel movement documented yet, passing gas ? ? ?Assessment and Plan: ?* Colonic mass ?CEA level only at 6.  Biopsy results were nondiagnostic. ?S/p right colectomy for ascending colon mass on 5/11.  Tolerating regular diet -no bowel movement documented yet ?Pathology is pending.  Outpatient follow-up at cancer center ? ?Microcytic anemia ?Status post 2 PRBC transfusion thus far cause is colonic mass and slow bleed over time.  Also received iron.  Hemoglobin  stable at 9.2   ? ? ?Schizoaffective disorder, bipolar type ([place]) ?We will discontinue Haldol and Ativan to prep for discharge.  If gets agitated we can certainly restart ?Patient has intermittent hallucination and psychotic behavior at times.  He seems close to his baseline now ? ?Tobacco use disorder ?Continue nicotine patch. ? ?Iron deficiency anemia due to chronic blood loss ?Continue iron sulfate ? ?Syncope ?Patient cannot provide detailed information.  CT head negative.  Likely due to volume depletion/anemia secondary to GI bleeding ?-No further syncope or dizzy spells while in the hospital. ? ? ? ? ? ?Mobility Assessment (last 72 hours)   ? ? Mobility Assessment   ? ? Row Name 10/14/21 2010 10/14/21 1156 10/14/21 0942 10/13/21 2051  ?  ? Does patient have an order for bedrest or is patient medically unstable No - Continue assessment -- -- No - Continue assessment   ? What is the highest level of mobility based on the progressive mobility assessment? Level 5 (Walks with assist in room/hall) - Balance while stepping forward/back and can walk in room with assist - Complete Level 5 (Walks with assist in room/hall) - Balance while stepping forward/back and can walk in room with assist - Complete Level 5 (Walks with assist in room/hall) - Balance while stepping forward/back and can walk in room with assist - Complete Level 5 (Walks with assist in room/hall) - Balance while stepping forward/back and can walk in room with assist - Complete   ? ?  ?  ? ?  ? ? ?  ? ?Subjective: Sleeping.  Tolerating diet ? ?Physical Exam: ?Vitals:  ?  10/15/21 1546 10/15/21 2005 10/16/21 0529 10/16/21 0755  ?BP: 90/71 (!) 94/58 100/64 103/65  ?Pulse: 71 82 81 74  ?Resp: '17 20 18 18  '$ ?Temp: 97.6 ?F (36.4 ?C) 98.1 ?F (36.7 ?C) 98.4 ?F (36.9 ?C) 98 ?F (36.7 ?C)  ?TempSrc: Oral Oral Oral Oral  ?SpO2: 100% 100% 100% 100%  ?Weight:      ?Height:      ? ?General: Alert and oriented x2, no acute distress ?HEENT: Normocephalic and atraumatic,  mucous membranes are slightly dry ?Cardiovascular: Regular rhythm, borderline bradycardia ?Respiratory: Clear to auscultation bilaterally ?Abdomen: Soft, laparotomy incision with some drainage on dressing.  No signs of infection ?Skin: No skin breaks, tears or lesions ?Psychiatry: Awake, nonfocal ?Neurology: No focal deficits ? ?Data Reviewed: ? ?There are no new results to review at this time. ? ?Family Communication: Brother is updated over phone.  He claims he is out of town so nobody can pick him up ? ?Disposition: ?Status is: Inpatient ?Remains inpatient appropriate because: Postop recovery ? ? Planned Discharge Destination: Home with Home Health ? ? ? DVT prophylaxis-SCD ?Time spent: 35 minutes ? ?Author: Max Sane, MD ?10/16/2021 11:28 AM ? ?For on call review [email or web address].  ?

## 2021-10-16 NOTE — Progress Notes (Signed)
Mobility Specialist - Progress Note ? ? 10/16/21 1400  ?Mobility  ?Activity Ambulated with assistance in hallway;Stood at bedside;Dangled on edge of bed  ?Level of Assistance Standby assist, set-up cues, supervision of patient - no hands on  ?Assistive Device Front wheel walker  ?Distance Ambulated (ft) 85 ft  ?Activity Response Tolerated well  ?$Mobility charge 1 Mobility  ? ? ?Pt long sitting in bed upon arrival using RA. Completes bed mobility ModI and STS MinA. Pt ambulates with SBA and voices no complaints. Pt is left on [facility] with needs in reach and RN notified. ? ?Merrily Brittle ?Mobility Specialist ?10/16/21, 2:34 PM ? ? ? ? ?

## 2021-10-16 NOTE — Assessment & Plan Note (Signed)
Patient cannot provide detailed information.  CT head negative.  Likely due to volume depletion/anemia secondary to GI bleeding ?-No further syncope or dizzy spells while in the hospital. ? ? ? ?

## 2021-10-16 NOTE — Plan of Care (Signed)
Patient hallucinates still. ?

## 2021-10-16 NOTE — Assessment & Plan Note (Signed)
Continue iron sulfate. 

## 2021-10-16 NOTE — Progress Notes (Signed)
Patient ID: Dustin [name], male   DOB: [birth year], 66 y.o.   MRN: [id number] ?    SURGICAL PROGRESS NOTE  ? ?Hospital Day(s): 10.  ? ?Interval History: Patient seen and examined, no acute events or new complaints overnight. Patient reports feeling well.  Patient more awake today.  He denies abdominal pain.  He denies abdominal distention.  He denies any nausea or vomiting.  He endorses that he has been eating well. ? ?Vital signs in last 24 hours: [min-max] current  ?Temp:  [97.6 ?F (36.4 ?C)-98.4 ?F (36.9 ?C)] 98 ?F (36.7 ?C) (05/14 0755) ?Pulse Rate:  [71-82] 74 (05/14 0755) ?Resp:  [17-20] 18 (05/14 0755) ?BP: (90-103)/(58-71) 103/65 (05/14 0755) ?SpO2:  [100 %] 100 % (05/14 0755)     Height: '5\' 7"'$  (170.2 cm) Weight: 60.7 kg BMI (Calculated): 20.95  ? ?Physical Exam:  ?Constitutional: alert, cooperative and no distress  ?Respiratory: breathing non-labored at rest  ?Cardiovascular: regular rate and sinus rhythm  ?Gastrointestinal: soft, non-tender, and non-distended ? ?Labs:  ? ?  Latest Ref Rng & Units 10/15/2021  ?  5:23 AM 10/14/2021  ?  5:43 AM 10/12/2021  ?  4:45 AM  ?CBC  ?WBC 4.0 - 10.5 K/uL 7.0   13.2   7.5    ?Hemoglobin 13.0 - 17.0 g/dL 9.2   9.6   9.4    ?Hematocrit 39.0 - 52.0 % 33.0   34.0   34.6    ?Platelets 150 - 400 K/uL 221   227   302    ? ? ?  Latest Ref Rng & Units 10/15/2021  ?  5:23 AM 10/14/2021  ?  5:43 AM 10/12/2021  ?  4:45 AM  ?CMP  ?Glucose 70 - 99 mg/dL 100   97   93    ?BUN 8 - 23 mg/dL '18   16   24    '$ ?Creatinine 0.61 - 1.24 mg/dL 1.06   0.99   1.02    ?Sodium 135 - 145 mmol/L 137   136   138    ?Potassium 3.5 - 5.1 mmol/L 4.5   5.1   4.5    ?Chloride 98 - 111 mmol/L 109   106   109    ?CO2 22 - 32 mmol/L '26   24   25    '$ ?Calcium 8.9 - 10.3 mg/dL 9.0   9.1   8.8    ?Total Protein 6.5 - 8.1 g/dL   6.6    ?Total Bilirubin 0.3 - 1.2 mg/dL   0.4    ?Alkaline Phos 38 - 126 U/L   62    ?AST 15 - 41 U/L   20    ?ALT 0 - 44 U/L   15    ? ? ?Imaging studies: No new pertinent imaging  studies ? ? ?Assessment/Plan:  ?66 y.o. male with obstructing ascending colon mass 3 Days Post-Op s/p right hemicolectomy, complicated by pertinent comorbidities including smoker, schizophrenia, tardive dyskinesia. ? ?-Patient continue with adequate vital signs.  No fever ?-Continue without any clinical deterioration.  Abdominal physical exam reassuring ?-Difficult to confirm if he is passing gas but there is no abdominal distention.  Tolerating diet well. ?-Agree with discharge planning for possible discharge tomorrow.  Patient will be followed by Dr. Dahlia Byes in a week or 2. ?-We will continue to follow closely. ? ?Gwendolyn Grant, MD ? ? ? ?

## 2021-10-16 NOTE — Assessment & Plan Note (Signed)
CEA level only at 6.  Biopsy results were nondiagnostic. ?S/p right colectomy for ascending colon mass on 5/11.  Tolerating regular diet -no bowel movement documented yet ?Pathology is pending.  Outpatient follow-up at cancer center ?

## 2021-10-16 NOTE — TOC Progression Note (Addendum)
Transition of Care (TOC) - Progression Note  ? ? ?Patient Details  ?Name: Dustin [name] ?MRN: [id number] ?Date of Birth: [birth year] ? ?Transition of Care (TOC) CM/SW Contact  ?Maximilian Tallo, LCSW ?Phone Number: [phone] ?10/16/2021, 1:06 PM ? ?Clinical Narrative:    ? ?CSW spoke with patient and updated him on discharge plan for tomorrow 10/17/2021.  CSW explained to patient home health service recommendations for PT/OT.  Patient verbalized understanding.  CSW asked patient if he would have a ride home tomorrow. CSW stated if patietn did not have transportation with family the only teo options are bus or taxi.  Patient stated he could not take the bus and he was not sure about a taxi.Patient stated he would ask his brother, [name] [phone], if would pick him up.  CSW asked patient if he had a legal guardian, patient stated he did not have a legal guardian and he makes all of his decisions.  CSW asked patient I could contact his brother and find out about transportation tomorrow, patient agreed.  CSW contacted Mr. ELAM ELLIS and updated him, on discharge plan.  Mr. Loletha Grayer Gellerman stated he was not sure of he would be available tomorrow to pick up patient.  CSW explained to him if he was unable to pick up patient, the other transportation options would be bus or taxi.  Mr. Loletha Grayer Walther was quiet for along time and stated he would have to see if he could.  I suggested he think on it and someone would contact him tomorrow when the patient was ready for d/c.  Mr. Loletha Grayer Lesinski verbalized understanding. ? ?Earlier CSW spoke to patient;s sister in law Ms. GEOFFERY AULTMAN [phone], and his niece, bit who stated the patient they did not think it was safe for the patient to return home due to his mental illness.  Both stated the patient was not capable of self-care, however the patient does not have a legal guardian and was adamant he wanted to return to his home.  Patient is has Medicaid and is followed by [facility]. ?  ? ?CSW sent  home health request to - [facility], [facility], [facility], [facility], [facility], [facility], [facility]  CSW requested reps reply to me today or Unit RN tomorrow, left contact info. ?  ? ?Expected Discharge Plan and Services ?  ?  ?  ?  ?  ?                ?  ?  ?  ?  ?  ?  ?  ?  ?  ?  ? ? ?Social Determinants of Health (SDOH) Interventions ?  ? ?Readmission Risk Interventions ?   ? View : No data to display.  ?  ?  ?  ? ? ?

## 2021-10-16 NOTE — Assessment & Plan Note (Addendum)
We will discontinue Haldol and Ativan to prep for discharge.  If gets agitated we can certainly restart ?Patient has intermittent hallucination and psychotic behavior at times.  He seems close to his baseline now ?

## 2021-10-16 NOTE — Assessment & Plan Note (Signed)
Status post 2 PRBC transfusion thus far cause is colonic mass and slow bleed over time.  Also received iron.  Hemoglobin stable at 9.2   ? ?

## 2021-10-16 NOTE — Assessment & Plan Note (Signed)
Continue nicotine patch  °

## 2021-10-16 NOTE — Plan of Care (Signed)

## 2021-10-17 DIAGNOSIS — D509 Iron deficiency anemia, unspecified: Secondary | ICD-10-CM | POA: Diagnosis not present

## 2021-10-17 DIAGNOSIS — F250 Schizoaffective disorder, bipolar type: Secondary | ICD-10-CM | POA: Diagnosis not present

## 2021-10-17 DIAGNOSIS — K6389 Other specified diseases of intestine: Secondary | ICD-10-CM | POA: Diagnosis not present

## 2021-10-17 DIAGNOSIS — F17200 Nicotine dependence, unspecified, uncomplicated: Secondary | ICD-10-CM | POA: Diagnosis not present

## 2021-10-17 MED ORDER — TETRACYCLINE HCL 500 MG PO CAPS: 500.0000 mg | ORAL_CAPSULE | Freq: Four times a day (QID) | ORAL | 0 refills | Status: DC

## 2021-10-17 MED ORDER — FERROUS SULFATE 325 (65 FE) MG PO TABS: 325.0000 mg | ORAL_TABLET | Freq: Two times a day (BID) | ORAL | 0 refills | Status: DC

## 2021-10-17 MED ORDER — METRONIDAZOLE 250 MG PO TABS: 250.0000 mg | ORAL_TABLET | Freq: Four times a day (QID) | ORAL | 0 refills | Status: DC

## 2021-10-17 MED ORDER — ENSURE ENLIVE PO LIQD: 237.0000 mL | Freq: Three times a day (TID) | ORAL | 12 refills | Status: DC

## 2021-10-17 MED ORDER — BISMUTH SUBSALICYLATE 262 MG PO CHEW: 524.0000 mg | CHEWABLE_TABLET | Freq: Four times a day (QID) | ORAL | 0 refills | Status: DC

## 2021-10-17 MED ORDER — ADULT MULTIVITAMIN W/MINERALS CH: 1.0000 | ORAL_TABLET | Freq: Every day | ORAL | 0 refills | Status: AC

## 2021-10-17 MED ORDER — NICOTINE 21 MG/24HR TD PT24: 21.0000 mg | MEDICATED_PATCH | Freq: Every day | TRANSDERMAL | 0 refills | Status: DC

## 2021-10-17 MED ORDER — PANTOPRAZOLE SODIUM 40 MG PO TBEC: 40.0000 mg | DELAYED_RELEASE_TABLET | Freq: Two times a day (BID) | ORAL | 0 refills | Status: DC

## 2021-10-17 NOTE — Assessment & Plan Note (Signed)
We will discontinue Haldol and Ativan to prep for discharge.  If gets agitated we can certainly restart ?Patient has intermittent hallucination and psychotic behavior at times.  He seems close to his baseline now ?

## 2021-10-17 NOTE — Care Management Important Message (Signed)
Important Message ? ?Patient Details  ?Name: Dustin [name] ?MRN: [id number] ?Date of Birth: 02-29-1956 ? ? ?Medicare Important Message Given:  Yes ? ? ? ? ?Dannette Barbara ?10/17/2021, 10:32 AM ?

## 2021-10-17 NOTE — TOC Progression Note (Signed)
Transition of Care (TOC) - Progression Note  ? ? ?Patient Details  ?Name: Dustin [name] ?MRN: [id number] ?Date of Birth: 04/10/1956 ? ?Transition of Care (TOC) CM/SW Contact  ? T , RN ?Phone Number: ?10/17/2021, 3:08 PM ? ?Clinical Narrative:    ?Notified by Joy from [facility] that they have accepted the case and will be following up outpatient for guardianship.  She confirms that patient can discharge to his home today ? ?MD confirms that patient is medically ready for discharge ? ?Met with patient at bedside.  He confirms that he will be going home at discharge, and declines any placement at group home facility ? ?Unable to secure home health services at this time.  Last agency that I am awaiting a response from with [facility].  ? ?Out patient palliative care referral made to Karen with [facility].  ? ?Referral for BSC and RW made to Ronda with [facility].  ? ?Unable to reach brother for transportation.  Spoke with sister in law who confirmed brother would be able to transport.  However at this time I am unable to reach either of them.  ? ?Per patient his brother has the key to his home.  ? ?Received call from niece expressing concerns about home discharge.  I notified her that patient is his own decision maker and these are his wishes, however [facility] would be following up outpatient    ? ? ?  ?  ? ?Expected Discharge Plan and Services ?  ?  ?  ?  ?  ?Expected Discharge Date: 10/17/21               ?  ?  ?  ?  ?  ?  ?  ?  ?  ?  ? ? ?Social Determinants of Health (SDOH) Interventions ?  ? ?Readmission Risk Interventions ?   ? View : No data to display.  ?  ?  ?  ? ? ?

## 2021-10-17 NOTE — Assessment & Plan Note (Signed)
Continue iron sulfate. 

## 2021-10-17 NOTE — Assessment & Plan Note (Signed)
CEA level only at 6.  Biopsy results were nondiagnostic. ?S/p right colectomy for ascending colon mass on 5/11.  Tolerating regular diet -no bowel movement documented yet ?Pathology is pending.  Outpatient follow-up at cancer center ?

## 2021-10-17 NOTE — Assessment & Plan Note (Signed)
Continue nicotine patch  °

## 2021-10-17 NOTE — Assessment & Plan Note (Signed)
Patient cannot provide detailed information.  CT head negative.  Likely due to volume depletion/anemia secondary to GI bleeding ?-No further syncope or dizzy spells while in the hospital. ? ? ? ?

## 2021-10-17 NOTE — Progress Notes (Addendum)
[facility] ?SURGICAL PROGRESS NOTE ? ?Hospital Day(s): 11.  ? ?Post op day(s): 4 Days Post-Op.  ? ?Interval History:  ?Patient seen and examined ?No acute events or new complaints overnight.  ?History not reliable given psychiatric history ?Patient reports he has abdominal soreness ?Denied any nausea, emesis ?No new labs or imaging this morning ?He is tolerating soft diet; unknown if he is passing flatus or having BM, RN noted last BM recorded was 05/13 ? ?Vital signs in last 24 hours: [min-max] current  ?Temp:  [97.9 ?F (36.6 ?C)-98.8 ?F (37.1 ?C)] 98.8 ?F (37.1 ?C) (05/15 0413) ?Pulse Rate:  [60-95] 95 (05/15 0413) ?Resp:  [16-18] 16 (05/15 0413) ?BP: (93-103)/(65-70) 93/68 (05/15 0413) ?SpO2:  [98 %-100 %] 98 % (05/15 0413)     Height: '5\' 7"'$  (170.2 cm) Weight: 60.7 kg BMI (Calculated): 20.95  ? ?Intake/Output last 2 shifts:  ?05/14 0701 - 05/15 0700 ?In: 600 [P.O.:600] ?Out: -   ? ?Physical Exam:  ?Constitutional: alert, cooperative and no distress; resting in bed ?Respiratory: breathing non-labored at rest  ?Cardiovascular: regular rate and sinus rhythm  ?Gastrointestinal:  Soft, does not appear overtly tender, non-distended, no rebound/guarding ?Integumentary: Laparotomy is CDI with staples; honeycomb removed this morning; no erythema nor drainage appreciable  ? ?Labs:  ? ?  Latest Ref Rng & Units 10/15/2021  ?  5:23 AM 10/14/2021  ?  5:43 AM 10/12/2021  ?  4:45 AM  ?CBC  ?WBC 4.0 - 10.5 K/uL 7.0   13.2   7.5    ?Hemoglobin 13.0 - 17.0 g/dL 9.2   9.6   9.4    ?Hematocrit 39.0 - 52.0 % 33.0   34.0   34.6    ?Platelets 150 - 400 K/uL 221   227   302    ? ? ?  Latest Ref Rng & Units 10/15/2021  ?  5:23 AM 10/14/2021  ?  5:43 AM 10/12/2021  ?  4:45 AM  ?CMP  ?Glucose 70 - 99 mg/dL 100   97   93    ?BUN 8 - 23 mg/dL '18   16   24    '$ ?Creatinine 0.61 - 1.24 mg/dL 1.06   0.99   1.02    ?Sodium 135 - 145 mmol/L 137   136   138    ?Potassium 3.5 - 5.1 mmol/L 4.5   5.1   4.5    ?Chloride 98 - 111 mmol/L 109    106   109    ?CO2 22 - 32 mmol/L '26   24   25    '$ ?Calcium 8.9 - 10.3 mg/dL 9.0   9.1   8.8    ?Total Protein 6.5 - 8.1 g/dL   6.6    ?Total Bilirubin 0.3 - 1.2 mg/dL   0.4    ?Alkaline Phos 38 - 126 U/L   62    ?AST 15 - 41 U/L   20    ?ALT 0 - 44 U/L   15    ? ? ? ?Imaging studies: No new pertinent imaging studies ? ? ?Assessment/Plan: ?66 y.o. male 4 Days Post-Op s/p right colectomy for ascending colon mass, complicated by significant psychiatric issues. ?  ?          - Okay to continue diet as tolerated ?- Monitor abdominal examination; on-going bowel function  ?          - Pain control prn; antiemetic prn ?- Mobilize as tolerated; therapies engaged already;  [place] ?- Oncology on board; follow up outpatient; pathology pending ?- Further management per primary service ? ? - Discharge Planning; [place] for discharge from surgical perspective, seems social issues are biggest barrier. He will need follow up in 7-10 days for staple removal in the office. I will update follow up and DC instructions ?  ?All of the above findings and recommendations were discussed with the patient and the medical team. ? ?-- ?Edison Simon, PA-C ?[facility] ?10/17/2021, 7:25 AM ?M-F: 7am - 4pm ? ?

## 2021-10-17 NOTE — Progress Notes (Signed)
?Progress Note ? ? ?Patient: Dustin [name] OMV:[id number] DOB: [birth year] DOA: 10/05/2021     11 ?DOS: the patient was seen and examined on 10/17/2021 ?  ?Brief hospital course: ?66 year old male with past medical history of tobacco abuse, schizophrenia and tardive dyskinesia who presented to the emergency room after being redirected there by his PCP.  2 days prior, blood work came back with a hemoglobin of 6.2 and an MCV of 64.  In addition, patient notes a 15 to 20 pound weight loss over the last 6 months.  Patient also relates a 1 week history of pharyngeal esophageal dysphagia.  Patient was transfused 1 unit packed red blood cells and brought in for further evaluation to the hospitalist service.  GI was consulted and took patient for EGD and colonoscopy on 5/4.  Endoscopy unremarkable, but colonoscopy revealed incidental diverticula and a partially obstructing colonic mass in the ascending colon.  Interestingly, biopsies note tubulovillous adenoma and not adenocarcinoma of the colon.  General surgery consulted and plans to take patient for removal of colon mass on Thursday, 5/11.  Psychiatry consulted for psychosis behavior. ? ?5/9: wants to eat, surgery on 5/11 ?5/10: Psych reeval for schizophrenia has patient is hallucinating and worried about surgery ?5/11: Right colectomy for colon mass ?5/12: POD 1 status post right colectomy for ascending colon mass.  Full liquid diet started, discontinue Foley catheter ?5/13: Advance diet, encourage mobility ?5/14: No bowel movement documented yet, passing gas ?5/15: Difficulty getting hold of his family.  Brother holds key for his house so unable to discharge safely ? ? ?Assessment and Plan: ?* Colonic mass ?CEA level only at 6.  Biopsy results were nondiagnostic. ?S/p right colectomy for ascending colon mass on 5/11.  Tolerating regular diet -no bowel movement documented yet ?Pathology is pending.  Outpatient follow-up at cancer center ? ?Microcytic anemia ?Status post  2 PRBC transfusion thus far cause is colonic mass and slow bleed over time.  Also received iron.  Hemoglobin stable at 9.2   ? ? ?Schizoaffective disorder, bipolar type ([place]) ?We will discontinue Haldol and Ativan to prep for discharge.  If gets agitated we can certainly restart ?Patient has intermittent hallucination and psychotic behavior at times.  He seems close to his baseline now ? ?Tobacco use disorder ?Continue nicotine patch. ? ?Iron deficiency anemia due to chronic blood loss ?Continue iron sulfate ? ?Syncope ?Patient cannot provide detailed information.  CT head negative.  Likely due to volume depletion/anemia secondary to GI bleeding ?-No further syncope or dizzy spells while in the hospital. ? ? ? ? ? ? ? ?  ? ?Subjective: Feeling fine.  Wants to go home, unable to confirm any bowel movement ? ?Physical Exam: ?Vitals:  ? 10/16/21 1613 10/16/21 1932 10/17/21 0413 10/17/21 1547  ?BP: 99/70 100/65 93/68 100/78  ?Pulse: 76 60 95 80  ?Resp: '18 16 16 16  '$ ?Temp: 97.9 ?F (36.6 ?C) 98.2 ?F (36.8 ?C) 98.8 ?F (37.1 ?C) 98.5 ?F (36.9 ?C)  ?TempSrc: Oral Oral Oral   ?SpO2: 99% 100% 98% 98%  ?Weight:      ?Height:      ? ?? General:?Alert and oriented x2, no acute distress ?? HEENT: Normocephalic and atraumatic, mucous membranes are slightly dry ?? Cardiovascular:?Regular rhythm, borderline bradycardia ?? Respiratory:?Clear to auscultation bilaterally ?? Abdomen:?Soft, laparotomy incision with some drainage on dressing. ?No signs of infection ?? Skin:?No skin breaks, tears or lesions ?? Psychiatry:?Awake, nonfocal ?? Neurology:?No focal deficits ??  ?Data Reviewed: ? ?There are no  new results to review at this time. ? ?Family Communication: Tried calling brother but he did not pick up the phone ? ?Disposition: ?Status is: Inpatient ?Remains inpatient appropriate because: No one at home.  Brother has the key for his house and would not answer the phone.  No safe disposition ? ? Planned Discharge Destination: Home with  Home Health ? ? ? DVT prophylaxis SCDs ?Time spent: 35 minutes ? ?Author: ?Max Sane, MD ?10/17/2021 5:44 PM ? ?For on call review [email or web address]  ?

## 2021-10-17 NOTE — Assessment & Plan Note (Signed)
Status post 2 PRBC transfusion thus far cause is colonic mass and slow bleed over time.  Also received iron.  Hemoglobin stable at 9.2   ? ?

## 2021-10-17 NOTE — Progress Notes (Signed)
Mobility Specialist - Progress Note ? ? 10/17/21 1100  ?Mobility  ?Activity Ambulated with assistance in hallway  ?Level of Assistance Standby assist, set-up cues, supervision of patient - no hands on  ?Assistive Device Front wheel walker  ?Distance Ambulated (ft) 90 ft  ?Activity Response Tolerated well  ?$Mobility charge 1 Mobility  ? ? ? ?Pt lying semi-supine in bed upon arrival, utilizing RA. Pt ambulated in hallway with supervision. Stooped posture that pt does not correct with cueing. Pt returned to bed with needs in reach. ? ? ?Dustin [name] ?Mobility Specialist ?10/17/21, 11:48 AM ? ? ? ? ?

## 2021-10-17 NOTE — Progress Notes (Signed)
[facility]) ?Hospital Liaison Note ? ?Notified by Kathyrn Drown of patient/family request for [facility] Palliative services at home after discharge.  ? ?[facility] liaison will follow patient for discharge disposition.  ? ?Please call with any Hospice/Palliative related questions or concerns.  ? ?Thank you for the opportunity to participate in this patient's care ? ?Misty Green RN, Copywriter, advertising, [employer] ?Hospital Liaison ?[phone] ?

## 2021-10-18 DIAGNOSIS — K6389 Other specified diseases of intestine: Secondary | ICD-10-CM | POA: Diagnosis not present

## 2021-10-18 DIAGNOSIS — F250 Schizoaffective disorder, bipolar type: Secondary | ICD-10-CM | POA: Diagnosis not present

## 2021-10-18 DIAGNOSIS — D649 Anemia, unspecified: Secondary | ICD-10-CM | POA: Diagnosis not present

## 2021-10-18 DIAGNOSIS — D509 Iron deficiency anemia, unspecified: Secondary | ICD-10-CM | POA: Diagnosis not present

## 2021-10-18 LAB — GLUCOSE, CAPILLARY: Glucose-Capillary: 128 mg/dL — ABNORMAL HIGH (ref 70–99)

## 2021-10-18 NOTE — TOC Transition Note (Signed)
Transition of Care (TOC) - CM/SW Discharge Note ? ? ?Patient Details  ?Name: Dustin [name] ?MRN: [id number] ?Date of Birth: Feb 19, 1956 ? ?Transition of Care (TOC) CM/SW Contact:  ?[name], RN ?Phone Number: ?10/18/2021, 8:46 AM ? ? ?Clinical Narrative:    ? ?Received return call from Nicolette Bang "niece" who works for [employer].  She is actually not the niece for this patient. She was referring to a different family member the entire time with her concerns due to family miscommunication.   ? ?Receeived message from sister in law [place] at [place] last night stating the house was unlocked.  Spoke with [name] this morning.  She confirms the house is unlocked but they will not be providing transportation for patient at discharge ? ?Cab voucher printed to the floor.  Bedside RN notified.  DME to be sent with patient ? ?Joy with APS notified ? ?TOC unable to secure home health services for patient.  MD notified  ? ? ?  ?  ? ? ?Patient Goals and CMS Choice ?  ?  ?  ? ?Discharge Placement ?  ?           ?  ?  ?  ?  ? ?Discharge Plan and Services ?  ?  ?           ?  ?  ?  ?  ?  ?  ?  ?  ?  ?  ? ?Social Determinants of Health (SDOH) Interventions ?  ? ? ?Readmission Risk Interventions ?   ? View : No data to display.  ?  ?  ?  ? ? ? ? ? ?

## 2021-10-18 NOTE — Progress Notes (Signed)
Patient refused several medications (Lovenox, Flagyl, Pepto Bismol) stating they are causing him not to have a bowel movement. Patient had one large vomit episode on the floor.  ?

## 2021-10-18 NOTE — Progress Notes (Signed)
Pt discharged per MD order. IV removed. Discharge orders reviewed with pt.  DME sent with pt. Pt discharged in golden [place] taxi with voucher. ?

## 2021-10-19 ENCOUNTER — Telehealth: Payer: Self-pay

## 2021-10-20 ENCOUNTER — Inpatient Hospital Stay: Payer: Medicare Other | Admitting: Nurse Practitioner

## 2021-10-20 NOTE — Discharge Summary (Signed)
Physician Discharge Summary   Patient: Dustin [name] MRN: [id number] DOB: 08-03-1955  Admit date:     10/05/2021  Discharge date: 10/18/2021  Discharge Physician: Max Sane   PCP: Jonetta Osgood, NP   Recommendations at discharge:    F/up with outpt providers as requested  Discharge Diagnoses: Principal Problem:   Colonic mass Active Problems:   Microcytic anemia   Schizoaffective disorder, bipolar type ([place])   Tobacco use disorder   Syncope   Iron deficiency anemia due to chronic blood loss   Hospital Course: 66 year old male with past medical history of tobacco abuse, schizophrenia and tardive dyskinesia who presented to the emergency room after being redirected there by his PCP.  2 days prior, blood work came back with a hemoglobin of 6.2 and an MCV of 64.  In addition, patient notes a 15 to 20 pound weight loss over the last 6 months.  Patient also relates a 1 week history of pharyngeal esophageal dysphagia.  Patient was transfused 1 unit packed red blood cells and brought in for further evaluation to the hospitalist service.  GI was consulted and took patient for EGD and colonoscopy on 5/4.  Endoscopy unremarkable, but colonoscopy revealed incidental diverticula and a partially obstructing colonic mass in the ascending colon.  Interestingly, biopsies note tubulovillous adenoma and not adenocarcinoma of the colon.  General surgery consulted and plans to take patient for removal of colon mass on Thursday, 5/11.  Psychiatry consulted for psychosis behavior.  5/9: wants to eat, surgery on 5/11 5/10: Psych reeval for schizophrenia has patient is hallucinating and worried about surgery 5/11: Right colectomy for colon mass 5/12: POD 1 status post right colectomy for ascending colon mass.  Full liquid diet started, discontinue Foley catheter 5/13: Advance diet, encourage mobility 5/14: No bowel movement documented yet, passing gas 5/15: Difficulty getting hold of his family.   Brother holds key for his house so unable to discharge safely  Assessment and Plan: * Colonic mass CEA level only at 6.  Biopsy results were nondiagnostic. S/p right colectomy for ascending colon mass on 5/11.  Tolerating regular diet  Pathology is pending at the time of D/C.  Outpatient follow-up at cancer center/Dr Tasia Catchings aware to go over the path results  - path shows INVASIVE COLORECTAL ADENOCARCINOMA ARISING IN A 8.2 CM TUBULOVILLOUS  ADENOMA  Microcytic anemia Status post 2 PRBC transfusion thus far cause is colonic mass and slow bleed over time.  Also received iron.  Hemoglobin stable since then  Schizoaffective disorder, bipolar type (HCC) stable  Tobacco use disorder Iron deficiency anemia due to chronic blood loss Continue iron sulfate  Syncope Patient cannot provide detailed information.  CT head negative.  Likely due to volume depletion/anemia secondary to GI bleeding. No further syncope or dizzy spells while in the hospital.        Consultants: Surgery Procedures performed: Colectomy  Disposition: Home health Diet recommendation:  Discharge Diet Orders (From admission, onward)     Start     Ordered   10/17/21 0000  Diet - low sodium heart healthy        10/17/21 1042           Cardiac diet DISCHARGE MEDICATION: Allergies as of 10/18/2021   No Known Allergies      Medication List     TAKE these medications    feeding supplement Liqd Take 237 mLs by mouth 3 (three) times daily between meals.   ferrous sulfate 325 (65 FE) MG tablet Take  1 tablet (325 mg total) by mouth 2 (two) times daily with a meal.   haloperidol decanoate 100 MG/ML injection Commonly known as: HALDOL DECANOATE Inject 1 mL (100 mg total) into the muscle once for 1 dose. Next dose due 5/15   Invega Sustenna 234 MG/1.5ML injection Generic drug: paliperidone Inject into the muscle.   multivitamin with minerals Tabs tablet Take 1 tablet by mouth daily.   nicotine 21 mg/24hr  patch Commonly known as: NICODERM CQ - dosed in mg/24 hours Place 1 patch (21 mg total) onto the skin daily.        Follow-up Information     Pabon, [place] F, MD. Schedule an appointment as soon as possible for a visit on 10/26/2021.   Specialty: General Surgery Why: Follow up in 7-10 days; right colectomy, has staples.Marland KitchenMarland KitchenMarland KitchenHe can see Thedore Mins if schedules   appointment with Pabon @ 10:15 a.m. Contact information: [address] [place] [address] [phone]         Jonetta Osgood, NP. Schedule an appointment as soon as possible for a visit on 10/20/2021.   Specialty: Nurse Practitioner Why: [facility] Discharge F/UP  appointment at 10:00 a.m. Contact information: [place] [address] [phone]         Earlie Server, MD. Schedule an appointment as soon as possible for a visit on 11/04/2021.   Specialty: Oncology Why: [facility] Discharge F/UP  appointment @ 12:20 PM Contact information: [place] [address] [phone]                Discharge Exam: Danley Danker Weights   10/05/21 1759  Weight: 60.7 kg   General: Alert and oriented x2, no acute distress HEENT: Normocephalic and atraumatic, mucous membranes are slightly dry Cardiovascular: Regular rhythm, borderline bradycardia Respiratory: Clear to auscultation bilaterally Abdomen: Soft, laparotomy incision with some drainage on dressing.  No signs of infection Skin: No skin breaks, tears or lesions Psychiatry: Awake, nonfocal Neurology: No focal deficits  Condition at discharge: fair  The results of significant diagnostics from this hospitalization (including imaging, microbiology, ancillary and laboratory) are listed below for reference.   Imaging Studies: DG Chest 2 View  Result Date: 10/03/2021 CLINICAL DATA:  Unintentional weight loss, tobacco abuse EXAM: CHEST - 2 VIEW COMPARISON:  None. FINDINGS: Frontal and lateral views of the chest  demonstrate an unremarkable cardiac silhouette. The lungs are hyperinflated with background interstitial prominence consistent with emphysema. No airspace disease, effusion, or pneumothorax. There are no acute bony abnormalities. IMPRESSION: 1. Emphysema.  No acute intrathoracic process. Electronically Signed   By: Randa Ngo M.D.   On: 10/03/2021 17:17   CT HEAD WO CONTRAST (5MM)  Result Date: 10/05/2021 CLINICAL DATA:  Altered mental status EXAM: CT HEAD WITHOUT CONTRAST TECHNIQUE: Contiguous axial images were obtained from the base of the skull through the vertex without intravenous contrast. RADIATION DOSE REDUCTION: This exam was performed according to the departmental dose-optimization program which includes automated exposure control, adjustment of the mA and/or kV according to patient size and/or use of iterative reconstruction technique. COMPARISON:  None Available. FINDINGS: Brain: No acute territorial infarction, hemorrhage or intracranial mass. Mild atrophy. Nonenlarged ventricles. Vascular: No hyperdense vessels.  Carotid vascular calcification. Skull: Normal. Negative for fracture or focal lesion. Small defects within the parietal bones posteriorly. Sinuses/Orbits: No acute finding. Other: None IMPRESSION: 1. No CT evidence for acute intracranial abnormality. 2. Mild atrophy. Electronically Signed   By: Donavan Foil M.D.   On:  10/05/2021 17:44   CT CHEST ABDOMEN PELVIS W CONTRAST  Result Date: 10/09/2021 CLINICAL DATA:  66 year old male with unintended weight loss and recent colonoscopy demonstrating a partially obstructing ascending colonic mass with biopsies demonstrating tubular villous adenoma. EXAM: CT CHEST, ABDOMEN, AND PELVIS WITH CONTRAST TECHNIQUE: Multidetector CT imaging of the chest, abdomen and pelvis was performed following the standard protocol during bolus administration of intravenous contrast. RADIATION DOSE REDUCTION: This exam was performed according to the departmental  dose-optimization program which includes automated exposure control, adjustment of the mA and/or kV according to patient size and/or use of iterative reconstruction technique. CONTRAST:  189m OMNIPAQUE IOHEXOL 300 MG/ML  SOLN COMPARISON:  06/14/2005 abdomen and pelvis CT FINDINGS: CT CHEST FINDINGS Cardiovascular: Heart size is normal. Heavy coronary artery atherosclerotic calcifications are present. Mild aortic atherosclerotic calcifications noted without thoracic aortic aneurysm. No pericardial effusion is identified. Mediastinum/Nodes: No enlarged mediastinal, hilar, or axillary lymph nodes. Thyroid gland, trachea, and esophagus demonstrate no significant findings. Lungs/Pleura: Moderate emphysema is noted, primarily centrilobular in greatest in the UPPER lungs. There is no evidence of airspace disease, mass, suspicious nodule, consolidation, pleural effusion or pneumothorax. Mild dependent opacities are noted, greatest in the LOWER lobes and LEFT greater than RIGHT, question atelectasis versus interstitial lung disease. Musculoskeletal: No acute or suspicious bony abnormalities are noted. CT ABDOMEN PELVIS FINDINGS Hepatobiliary: The liver and gallbladder are unremarkable. There is no evidence of intrahepatic or extrahepatic biliary dilatation. Pancreas: Unremarkable Spleen: Unremarkable Adrenals/Urinary Tract: Nonobstructing bilateral renal calculi are identified, mostly punctate. The largest calculus measuring 4 mm in the mid LEFT kidney. There is no evidence of hydronephrosis or renal mass. The adrenal glands and bladder are unremarkable. Stomach/Bowel: A moderate to large amount of stool throughout the colon is noted. There is a suggestion of soft tissue fullness measuring approximately 5.5 cm within the UPPER ascending colon which may represent the mass identified on colonoscopy. There is however limited evaluation from stool burden. The remainder of the bowel is unremarkable. There is no evidence of bowel  obstruction. The stomach and appendix appear normal. Vascular/Lymphatic: A 3.4 cm fusiform infrarenal abdominal aortic aneurysm is now noted. Aortic atherosclerotic calcifications are present. No enlarged lymph nodes are identified. Reproductive: Prostate is unremarkable. Other: A trace amount of free pelvic fluid is noted. There is no evidence of pneumoperitoneum or focal collection. Musculoskeletal: No acute or suspicious bony abnormalities are identified. Degenerative disc disease/mild spondylosis at L5-S1 noted. IMPRESSION: 1. Suggestion of 5.4 cm soft tissue fullness within the UPPER ascending colon which may represent the mass identified on colonoscopy, but limited evaluation due to large stool burden. No evidence of abnormal lymph nodes or metastatic disease. 2. 3.4 cm infrarenal abdominal aortic aneurysm. Recommend follow-up ultrasound every 3 years. This recommendation follows ACR consensus guidelines: White Paper of the ACR Incidental Findings Committee II on Vascular Findings. J Am Coll Radiol 2013; 10:789-794. 3. Mild dependent opacities within the lungs, greatest in the LOWER lobes and LEFT greater than RIGHT, question atelectasis versus interstitial lung disease. 4. Nonobstructing bilateral renal calculi. 5. Heavy coronary artery disease. 6. Aortic Atherosclerosis (ICD10-I70.0) and Emphysema (ICD10-J43.9). Electronically Signed   By: JMargarette CanadaM.D.   On: 10/09/2021 15:34    Microbiology: No results found for this or any previous visit.  Labs: CBC: Recent Labs  Lab 10/14/21 0543 10/15/21 0523  WBC 13.2* 7.0  HGB 9.6* 9.2*  HCT 34.0* 33.0*  MCV 74.6* 75.9*  PLT 227 2300  Basic Metabolic Panel: Recent Labs  Lab  10/14/21 0543 10/15/21 0523  NA 136 137  K 5.1 4.5  CL 106 109  CO2 24 26  GLUCOSE 97 100*  BUN 16 18  CREATININE 0.99 1.06  CALCIUM 9.1 9.0  MG 2.2  --    Liver Function Tests: No results for input(s): AST, ALT, ALKPHOS, BILITOT, PROT, ALBUMIN in the last 168  hours. CBG: Recent Labs  Lab 10/16/21 0757 10/18/21 0729  GLUCAP 135* 128*    Discharge time spent: greater than 30 minutes.  Signed: Max Sane, MD [facility] 10/20/2021

## 2021-10-21 ENCOUNTER — Encounter: Payer: Self-pay | Admitting: Nurse Practitioner

## 2021-10-21 ENCOUNTER — Ambulatory Visit (INDEPENDENT_AMBULATORY_CARE_PROVIDER_SITE_OTHER): Payer: Medicare Other | Admitting: Nurse Practitioner

## 2021-10-21 VITALS — BP 82/63 | HR 100 | Temp 98.2°F | Resp 16 | Ht 67.0 in | Wt 127.0 lb

## 2021-10-21 DIAGNOSIS — Z09 Encounter for follow-up examination after completed treatment for conditions other than malignant neoplasm: Secondary | ICD-10-CM | POA: Diagnosis not present

## 2021-10-21 DIAGNOSIS — C189 Malignant neoplasm of colon, unspecified: Secondary | ICD-10-CM | POA: Diagnosis not present

## 2021-10-21 DIAGNOSIS — D500 Iron deficiency anemia secondary to blood loss (chronic): Secondary | ICD-10-CM

## 2021-10-21 DIAGNOSIS — R2689 Other abnormalities of gait and mobility: Secondary | ICD-10-CM | POA: Diagnosis not present

## 2021-10-21 DIAGNOSIS — M6281 Muscle weakness (generalized): Secondary | ICD-10-CM | POA: Diagnosis not present

## 2021-10-21 DIAGNOSIS — F250 Schizoaffective disorder, bipolar type: Secondary | ICD-10-CM

## 2021-10-21 NOTE — Patient Instructions (Addendum)
Follow-up Information       Pabon, [place] F, MD. Schedule an appointment as soon as possible for a visit on 10/26/2021.   Specialty: General Surgery Why: Follow up in 7-10 days; right colectomy, has staples.Marland KitchenMarland KitchenMarland KitchenHe can see Thedore Mins if schedules   appointment with Pabon @ 10:15 a.m. Contact information: [address] [place] [address] [phone]      Earlie Server, MD. Schedule an appointment as soon as possible for a visit on 11/04/2021.   Specialty: Oncology Why: [facility] Discharge F/UP   appointment @ 12:20 PM Contact information: [address] [place] [address] [phone]

## 2021-10-21 NOTE — Progress Notes (Signed)
[facility] [address], [address] [place] [id number] [facility] Discharge Acute Issues Care Follow Up                                                                        Patient Demographics  Dustin [name], is a 66 y.o. male  DOB [birth year]  MRN [id number].  Primary MD  Jonetta Osgood, NP   Reason for TCC follow Up - iron deficiency anemia and colonic mass    Past Medical History:  Diagnosis Date   Schizophrenia [facility])     Past Surgical History:  Procedure Laterality Date   COLONOSCOPY N/A 10/06/2021   Procedure: COLONOSCOPY;  Surgeon: Toledo, Benay Pike, MD;  Location: [facility] ENDOSCOPY;  Service: Gastroenterology;  Laterality: N/A;   ESOPHAGOGASTRODUODENOSCOPY N/A 10/06/2021   Procedure: ESOPHAGOGASTRODUODENOSCOPY (EGD);  Surgeon: Toledo, Benay Pike, MD;  Location: [facility] ENDOSCOPY;  Service: Gastroenterology;  Laterality: N/A;   PARTIAL COLECTOMY  10/13/2021   Procedure: RIGHT COLECTOMY;  Surgeon: Jules Husbands, MD;  Location: [facility] ORS;  Service: General;;       Recent HPI and [place] presented for a new patient office visit on  10/03/21 to establish with a new PCP. His main concern was the unexplained weight loss, generalized muscle weakness and impaired gait. Routine Labs were ordered and the patient was able to get his labs done the same day. The next morning, his labs resulted and his hemoglobin was significantly low at 6.2 and he would need direct admission to the hospital for blood transfusion and further evaluation for blood loss. There were no available beds at [facility] on 10/04/21 but a bed was secured for the patient's admission on 10/05/21 and he went to the hospital for direct admission.   Hospital Course: 66 year old  male with past medical history of tobacco abuse, schizophrenia and tardive dyskinesia who presented to the emergency room after  being redirected there by his PCP.  2 days prior, blood work came back with a hemoglobin of 6.2 and an MCV of 64.  In addition, patient notes a 15 to 20 pound weight loss over the last 6 months.  Patient also relates a 1 week history of pharyngeal esophageal dysphagia.  Patient was transfused 1 unit packed red blood cells and brought in for further evaluation to the hospitalist service.  GI was consulted and took patient for EGD and colonoscopy on 5/4.  Endoscopy unremarkable, but colonoscopy revealed incidental diverticula and a partially obstructing colonic mass in the ascending colon.  Interestingly, biopsies note tubulovillous adenoma and not adenocarcinoma of the colon.  General surgery consulted and plans to take patient for removal of colon mass on Thursday, 5/11.  Psychiatry consulted for psychosis behavior.   5/9: wants to eat, surgery on 5/11 5/10: Psych reeval for schizophrenia has patient is hallucinating and worried about surgery 5/11: Right colectomy for colon mass 5/12: POD 1 status post right colectomy for ascending colon mass.  Full liquid diet started, discontinue Foley  catheter 5/13: Advance diet, encourage mobility 5/14: No bowel movement documented yet, passing gas 5/15: Difficulty getting hold of his family.  Brother holds key for his house so unable to discharge safely   Assessment and Plan: * Colonic mass CEA level only at 6.  Biopsy results were nondiagnostic. S/p right colectomy for ascending colon mass on 5/11.  Tolerating regular diet  Pathology is pending at the time of D/C.  Outpatient follow-up at cancer center/Dr Tasia Catchings aware to go over the path results  - path shows INVASIVE COLORECTAL ADENOCARCINOMA ARISING IN A 8.2 CM TUBULOVILLOUS  ADENOMA   Microcytic anemia Status post 2 PRBC transfusion thus far cause is colonic mass and slow bleed over time.  Also received iron.  Hemoglobin stable since then   Schizoaffective disorder, bipolar type (HCC) stable   Tobacco  use disorder Iron deficiency anemia due to chronic blood loss Continue iron sulfate   Syncope Patient cannot provide detailed information.  CT head negative.  Likely due to volume depletion/anemia secondary to GI bleeding. No further syncope or dizzy spells while in the hospital  [place] Issue to be followed in the Clinic   Colonic mass Active Problems:   Microcytic anemia   Schizoaffective disorder, bipolar type (HCC)   Tobacco use disorder   Syncope   Iron deficiency anemia due to chronic blood loss   Subjective:   [name] today has, No headache, No chest pain, No abdominal pain - No Nausea, No new weakness tingling or numbness, No Cough - SOB. He reports fatigue, generalized weakness and difficulty walking, unsteady gait.   Assessment & Plan   1. Hospital discharge follow-up Patient is being seen today after discharge for a hospital stay to treat iron deficiency anemia and a colonic mass was found and he is now status post colectomy during which they removed the entire mass.  He was in the hospital for approximately 2 weeks and is now being seen by physical therapy to help him get his physical strength back.  Staying in the hospital has left him feeling fatigued and weak especially since he had major surgery.  2. Adenocarcinoma of colon ([place]) The colonic mass was removed entirely during the surgery and sent for pathology which confirmed that the mass was cancerous, adenocarcinoma.  3. Iron deficiency anemia due to chronic blood loss This problem was treated in the hospital and will be followed by hematology at the [facility] cancer center  4. Impairment of balance Is using a walker right now and will be working with physical therapy but will most likely need a shower seat so that he can be safe and be less likely to fall when he is showering.  He will also most likely need a 3 prong cane for when he no longer needs the walker  5. Generalized muscle  weakness Generalized muscle weakness continues, it was present prior to his admission to the hospital and this was due in part to the unexplained weight loss, anemia and the colonic mass.  Now that he is out of the hospital, and he continues to have some generalized muscle weakness because of deconditioning while he was in the hospital and additional weight loss related to the cancer and surgery.  Patient will be seen by physical therapy.  Patient is currently using a walker that was provided by the hospital but he also needs a shower seat so he is able to bathe himself without the risk of injuring himself or tiring out too  fast.  He will also need a 3 pronged cane for when he no longer needs the walker as he continues to strengthen himself  6. Schizoaffective disorder, bipolar type ([place]) At baseline, patient has a provider he sees and he gets decanoate injections    Reason for frequent admissions/ER visits      Iron deficiency anemia At risk for postsurgical infection at incision site--- incision site is intact and shows no sign of infection at this time Generalized weakness Increased risk for falls   Objective:   Vitals:   10/21/21 1207  BP: (!) 82/63  Pulse: 100  Resp: 16  Temp: 98.2 F (36.8 C)  SpO2: 99%  Weight: 127 lb (57.6 kg)  Height: '5\' 7"'$  (1.702 m)    Wt Readings from Last 3 Encounters:  11/30/21 127 lb 6.4 oz (57.8 kg)  11/08/21 136 lb (61.7 kg)  10/26/21 116 lb 9.6 oz (52.9 kg)    Allergies as of 10/21/2021   No Known Allergies      Medication List        Accurate as of Oct 21, 2021 11:59 PM. If you have any questions, ask your nurse or doctor.          STOP taking these medications    ferrous sulfate 325 (65 FE) MG tablet Stopped by: Jonetta Osgood, NP       TAKE these medications    feeding supplement Liqd Take 237 mLs by mouth 3 (three) times daily between meals.   haloperidol decanoate 100 MG/ML injection Commonly known as: HALDOL  DECANOATE Inject 1 mL (100 mg total) into the muscle once for 1 dose. Next dose due 5/15   Invega Sustenna 234 MG/1.5ML injection Generic drug: paliperidone Inject into the muscle.   multivitamin with minerals Tabs tablet Take 1 tablet by mouth daily.   nicotine 21 mg/24hr patch Commonly known as: NICODERM CQ - dosed in mg/24 hours Place 1 patch (21 mg total) onto the skin daily.         Physical Exam: Constitutional: Patient appears well-developed and well-nourished. Not in obvious distress. HENT: Normocephalic, atraumatic, External right and left ear normal. Oropharynx is clear and moist.  Eyes: Conjunctivae and EOM are normal. PERRLA, no scleral icterus. Neck: Normal ROM. Neck supple. No JVD. No tracheal deviation. No thyromegaly. CVS: RRR, S1/S2 +, no murmurs, no gallops, no carotid bruit.  Pulmonary: Effort and breath sounds normal, no stridor, rhonchi, wheezes, rales.  Abdominal: Soft. BS +, no distension, tenderness, rebound or guarding.  Musculoskeletal: Normal range of motion. No edema and no tenderness.  Lymphadenopathy: No lymphadenopathy noted, cervical, inguinal or axillary Neuro: Alert. Normal reflexes, muscle tone coordination. No cranial nerve deficit. Skin: Skin is warm and dry. No rash noted. Not diaphoretic. No erythema. No pallor. Psychiatric: Normal mood and affect. Behavior, judgment, thought content normal.   Data Review   Micro Results No results found for this or any previous visit (from the past 240 hour(s)).   CBC No results for input(s): "WBC", "HGB", "HCT", "PLT", "MCV", "MCH", "MCHC", "RDW", "LYMPHSABS", "MONOABS", "EOSABS", "BASOSABS", "BANDABS" in the last 168 hours.  Invalid input(s): "NEUTRABS", "BANDSABD"   Chemistries  No results for input(s): "NA", "K", "CL", "CO2", "GLUCOSE", "BUN", "CREATININE", "CALCIUM", "MG", "AST", "ALT", "ALKPHOS", "BILITOT" in the last 168 hours.  Invalid input(s):  "GFRCGP"  ------------------------------------------------------------------------------------------------------------------ CrCl cannot be calculated (Patient's most recent lab result is older than the maximum 21 days allowed.). ------------------------------------------------------------------------------------------------------------------ No results for input(s): "HGBA1C" in the last 72 hours. ------------------------------------------------------------------------------------------------------------------  No results for input(s): "CHOL", "HDL", "LDLCALC", "TRIG", "CHOLHDL", "LDLDIRECT" in the last 72 hours. ------------------------------------------------------------------------------------------------------------------ No results for input(s): "TSH", "T4TOTAL", "T3FREE", "THYROIDAB" in the last 72 hours.  Invalid input(s): "FREET3" ------------------------------------------------------------------------------------------------------------------ No results for input(s): "VITAMINB12", "FOLATE", "FERRITIN", "TIBC", "IRON", "RETICCTPCT" in the last 72 hours.  Coagulation profile No results for input(s): "INR", "PROTIME" in the last 168 hours.  No results for input(s): "DDIMER" in the last 72 hours.  Cardiac Enzymes No results for input(s): "CKMB", "TROPONINI", "MYOGLOBIN" in the last 168 hours.  Invalid input(s): "CK" ------------------------------------------------------------------------------------------------------------------ Invalid input(s): "POCBNP"  Return in about 2 months (around 12/21/2021) for CPE, [place] PCP.   Time Spent in minutes  45 Time spent with patient included reviewing progress notes, labs, imaging studies, and discussing plan for follow up.   [place] Controlled Substance Database was reviewed by me for overdose risk score (ORS)  This patient was seen by Jonetta Osgood, FNP-C in collaboration with Dr. Clayborn Bigness as a part of collaborative care  agreement.    Jonetta Osgood MSN, FNP-C on 12/13/2021 at 8:51 AM   **Disclaimer: This note may have been dictated with voice recognition software. Similar sounding words can inadvertently be transcribed and this note may contain transcription errors which may not have been corrected upon publication of note.**

## 2021-10-24 NOTE — Telephone Encounter (Signed)
error 

## 2021-10-25 LAB — SURGICAL PATHOLOGY

## 2021-10-26 ENCOUNTER — Encounter: Payer: Medicare Other | Admitting: Surgery

## 2021-10-26 ENCOUNTER — Other Ambulatory Visit: Payer: Self-pay

## 2021-10-26 ENCOUNTER — Encounter: Payer: Self-pay | Admitting: Surgery

## 2021-10-26 ENCOUNTER — Ambulatory Visit (INDEPENDENT_AMBULATORY_CARE_PROVIDER_SITE_OTHER): Payer: Medicare Other | Admitting: Surgery

## 2021-10-26 ENCOUNTER — Telehealth: Payer: Self-pay

## 2021-10-26 VITALS — BP 93/68 | HR 85 | Temp 97.9°F | Ht 70.0 in | Wt 116.6 lb

## 2021-10-26 DIAGNOSIS — C182 Malignant neoplasm of ascending colon: Secondary | ICD-10-CM

## 2021-10-26 DIAGNOSIS — Z09 Encounter for follow-up examination after completed treatment for conditions other than malignant neoplasm: Secondary | ICD-10-CM

## 2021-10-26 NOTE — Patient Instructions (Addendum)
Please see your follow up appointment listed below.    GENERAL POST-OPERATIVE PATIENT INSTRUCTIONS   WOUND CARE INSTRUCTIONS:  Keep a dry clean dressing on the wound if there is drainage. The initial bandage may be removed after 24 hours.  Once the wound has quit draining you may leave it open to air.  If clothing rubs against the wound or causes irritation and the wound is not draining you may cover it with a dry dressing during the daytime.  Try to keep the wound dry and avoid ointments on the wound unless directed to do so.  If the wound becomes bright red and painful or starts to drain infected material that is not clear, please contact your physician immediately.  If the wound is mildly pink and has a thick firm ridge underneath it, this is normal, and is referred to as a healing ridge.  This will resolve over the next 4-6 weeks.  BATHING: You may shower if you have been informed of this by your surgeon. However, Please do not submerge in a tub, hot tub, or pool until incisions are completely sealed or have been told by your surgeon that you may do so.  DIET:  You may eat any foods that you can tolerate.  It is a good idea to eat a high fiber diet and take in plenty of fluids to prevent constipation.  If you do become constipated you may want to take a mild laxative or take ducolax tablets on a daily basis until your bowel habits are regular.  Constipation can be very uncomfortable, along with straining, after recent surgery.  ACTIVITY:  You are encouraged to cough and deep breath or use your incentive spirometer if you were given one, every 15-30 minutes when awake.  This will help prevent respiratory complications and low grade fevers post-operatively if you had a general anesthetic.  You may want to hug a pillow when coughing and sneezing to add additional support to the surgical area, if you had abdominal or chest surgery, which will decrease pain during these times.  You are encouraged to  walk and engage in light activity for the next two weeks.  You should not lift more than 20 pounds for 6 weeks total after surgery as it could put you at increased risk for complications.  Twenty pounds is roughly equivalent to a plastic bag of groceries. At that time- Listen to your body when lifting, if you have pain when lifting, stop and then try again in a few days. Soreness after doing exercises or activities of daily living is normal as you get back in to your normal routine.  MEDICATIONS:  Try to take narcotic medications and anti-inflammatory medications, such as tylenol, ibuprofen, naprosyn, etc., with food.  This will minimize stomach upset from the medication.  Should you develop nausea and vomiting from the pain medication, or develop a rash, please discontinue the medication and contact your physician.  You should not drive, make important decisions, or operate machinery when taking narcotic pain medication.  SUNBLOCK Use sun block to incision area over the next year if this area will be exposed to sun. This helps decrease scarring and will allow you avoid a permanent darkened area over your incision.  QUESTIONS:  Please feel free to call our office if you have any questions, and we will be glad to assist you. [phone]

## 2021-10-26 NOTE — Telephone Encounter (Signed)
Langley Gauss [phone]  opt #[phone]) called from Palliative Care and was requesting verbal order to start Palliative care services for patient.  Per Alyssa we gave verbal ok to start.  Langley Gauss advised that it isn't for Hospice that it is just for Palliative care.

## 2021-10-27 NOTE — Progress Notes (Signed)
Status post right colectomy 2 weeks ago.  Doing very well.  No fevers no chills eating diet.  Having bowel movements Pathology discussed with the patient and the family in detail.  No nodal involvement   PE NAD Abd: Soft nontender staples removed.  Incision healing well without infection.  A/p doing well Pending oncology evaluation. RTC 3 months

## 2021-10-28 ENCOUNTER — Telehealth: Payer: Self-pay | Admitting: Nurse Practitioner

## 2021-10-28 NOTE — Telephone Encounter (Signed)
Spoke with patient's brother [name] regarding the Palliative referral/services and all questions were answered and he was in agreement with scheduling visit.  I have scheduled a Telehealth Consult for 11/01/21 @ 1:30 PM

## 2021-11-01 ENCOUNTER — Encounter: Payer: Self-pay | Admitting: Nurse Practitioner

## 2021-11-01 ENCOUNTER — Other Ambulatory Visit: Payer: Medicare Other | Admitting: Nurse Practitioner

## 2021-11-01 DIAGNOSIS — R531 Weakness: Secondary | ICD-10-CM

## 2021-11-01 DIAGNOSIS — F250 Schizoaffective disorder, bipolar type: Secondary | ICD-10-CM

## 2021-11-01 DIAGNOSIS — R451 Restlessness and agitation: Secondary | ICD-10-CM

## 2021-11-01 DIAGNOSIS — Z515 Encounter for palliative care: Secondary | ICD-10-CM

## 2021-11-01 NOTE — Progress Notes (Signed)
Dustin [name] Consult Note Telephone: [phone]  Fax: [phone]   Date of encounter: 11/01/21 2:41 PM PATIENT NAME: Dustin [name] [address] [place] [address]   [phone] (home)  DOB: 11/17/1955 MRN: [id number] PRIMARY CARE PROVIDER:    Jonetta [name], Dustin [name],  [place] [address] [phone]  REFERRING PROVIDER:   Jonetta [name], Dustin [name] Dustin [name],  Dustin [name] [address] [phone]  RESPONSIBLE PARTY:    Contact Information     Name Relation Home Work Dustin [name] Other   [phone]   Dustin [name]   [phone]      Due to the COVID-19 crisis, this visit was done via telemedicine from my office and it was initiated and consent by this patient and or family.  I connected with Dustin [name], Dustin [name] brother, sister in law, Dustin [name] with Dustin [name] on 11/01/21 by a telephone as video not available enabled telemedicine application and verified that I am speaking with the correct person using two identifiers.   I discussed the limitations of evaluation and management by telemedicine. The patient expressed understanding and agreed to proceed. Palliative Care was asked to follow this patient by consultation request of  Dustin [name], Dustin [name] to address advance care planning and complex medical decision making. This is the initial visit.                         ASSESSMENT AND PLAN / RECOMMENDATIONS:  Symptom Management/Plan: 1. Advance Care Planning;  Will revisit at next visit currently full code with aggressive interventions  2. Agitation, secondary to Schizophrenia, refusing to do self-care; Dustin [name] endorses his daily routine, sleeping all day, increase in agitation when trying to get him to bath, or do anything with him. Dustin [name] endorses Dustin [name] has been following him for many years including psychiatry. Will do PC SW/RN consult to have do a visit for  further resources available, get a better picture of needs as this is a telephone visit. Dustin [name] and Dustin [name] in agreement.   3. Debility secondary to decompensation from hospitalization, encourage mobility, discussed challenges and barriers, coping strategies, encouraged to re-contact Dustin [name] for Psychiatry visit.   4. Goals of Care: Goals include to maximize quality of life and symptom management. Our advance care planning conversation included a discussion about:    The value and importance of advance care planning  Exploration of personal, cultural or spiritual beliefs that might influence medical decisions  Exploration of goals of care in the event of a sudden injury or illness  Identification and preparation of a healthcare agent  Review and updating or creation of an advance directive document.  5. Palliative care encounter; Palliative care encounter; Palliative medicine team will continue to support patient, patient's family, and medical team. Visit consisted of counseling and education dealing with the complex and emotionally intense issues of symptom management and palliative care in the setting of serious and potentially life-threatening illness Follow up Palliative Care Visit: Palliative care will continue to follow for complex medical decision making, advance care planning, and clarification of goals. Return 1 to 2 weeks with PC RN/SW then Dustin [name] 4 weeks  I spent 41 minutes providing this consultation. More than 50% of the time in this consultation was spent in counseling and care coordination. PPS: 50% Chief Complaint: Initial palliative consult for complex medical decision making  HISTORY OF PRESENT ILLNESS:  Dustin [name]  is a 66 y.o. year old male  with multiple medical problems including Schizoaffective disorder, bipolar type, tardive dyskinesia, IDA, microcytic anemia, history of pharyngeal esophageal dysphagia, (10/13/2021) Right colectomy for colon mass, tobacco abuse. Dustin.  [name] was hospitalized from 66/08/2021 to 10/18/2021 for colonic mass,   Iron deficiency anemia due to chronic blood loss with hgb 6.2 requiring transfusion, noted 15 to 20 lb weight loss over last 6 months. GI consulted with Endo/colonscopy with incidental diverticula with partially obstructing colonic mass in ascending colon with biopsies tubulovillous adenoma, not adenocarcinoma of colon which was removed during hospitalization. Psychiatry did consult due to hallucinations, psychosis behaviors. I called Dustin [name], Dustin [name] sister-in law with Dustin [name], brother with Dustin [name]. We reviewed family, social, past medical history. Dustin [name] has not been married, no children, worked early on in his life then once dx Schizoaffective no further employment. Dustin [name] does live by himself, Dustin [name] has been involved for many years, including psychiatry services. We talked about currently Dustin [name] has becoming more agitated, refusing to bath, go out of the house. He is able to ambulate, no recent falls. Dustin [name] endorses he has not bathed in over 2 weeks. Dustin [name] endorses it depends on Dustin [name]'s mood what they can do for and with him. We talked about Dustin [name] does receive food from Dustin [name] when she cooks for him. ROS, symptoms discussed, we talked about generalized weakness, recent hospitalization and how Dustin [name] has been since returned home. We talked about overall concerns of staying by himself. Dustin [name] and Dustin [name] endorses they feel like Dustin [name] can no longer stay by himself. We talked about having PC SW/RN make a visit within the next few weeks out of concern for further assessment of resources needed. We talked about  medical goals, role pc in poc. We talked about f/u visit with PC Dustin [name], scheduled. Therapeutic listening, emotional support provided, questions answered.   History obtained from review of EMR, discussion with primary team, and interview with family, facility staff/caregiver and/or Dustin [name].  I reviewed available labs,  medications, imaging, studies and related documents from the EMR.  Records reviewed and summarized above.   ROS 10 point system reviewed with Dustin [name], Dustin. [place] sister-in law and brother, Dustin [name]  Physical Exam: deferred CURRENT PROBLEM LIST:  Patient Active Problem List   Diagnosis Date Noted   Iron deficiency anemia due to chronic blood loss    Colonic mass 10/06/2021   Microcytic anemia 10/05/2021   Syncope 10/05/2021   Symptomatic anemia    Schizoaffective disorder, bipolar type ([place]) 01/24/2017   Tobacco use disorder 11/29/2016   PAST MEDICAL HISTORY:  Active Ambulatory Problems    Diagnosis Date Noted   Tobacco use disorder 11/29/2016   Schizoaffective disorder, bipolar type ([place]) 01/24/2017   Microcytic anemia 10/05/2021   Symptomatic anemia    Syncope 10/05/2021   Colonic mass 10/06/2021   Iron deficiency anemia due to chronic blood loss    Resolved Ambulatory Problems    Diagnosis Date Noted   Tardive dyskinesia 01/24/2017   Paranoid schizophrenia ([place]) 09/03/2017   Schizophrenia ([place]) 07/12/2018   No Additional Past Medical History   SOCIAL HX:  Social History   Tobacco Use   Smoking status: Every Day    Packs/day: 0.50    Years: 15.00    Pack years: 7.50    Types: Cigarettes   Smokeless tobacco: Never  Substance Use Topics   Alcohol use: Not Currently   FAMILY HX:  Family History  Problem  Relation Age of Onset   Lung cancer Father    Hypertension Brother    Hypertension Brother    Hypertension Brother    Prostate cancer Brother       ALLERGIES: No Known Allergies   PERTINENT MEDICATIONS:  Outpatient Encounter Medications as of 11/01/2021  Medication Sig   acetaminophen (TYLENOL) 500 MG tablet Take by mouth.   feeding supplement (ENSURE ENLIVE / ENSURE PLUS) LIQD Take 237 mLs by mouth 3 (three) times daily between meals.   haloperidol decanoate (HALDOL DECANOATE) 100 MG/ML injection Inject 1 mL (100 mg total) into the muscle once for 1 dose.  Next dose due 5/15   INVEGA SUSTENNA 234 MG/1.5ML injection Inject into the muscle.   Multiple Vitamin (MULTIVITAMIN WITH MINERALS) TABS tablet Take 1 tablet by mouth daily.   Multiple Vitamin (MULTIVITAMIN) capsule Take 1 capsule by mouth daily.   nicotine (NICODERM CQ - DOSED IN MG/24 HOURS) 21 mg/24hr patch Place 1 patch (21 mg total) onto the skin daily.   No facility-administered encounter medications on file as of 11/01/2021.   Thank you for the opportunity to participate in the care of Dustin. [place].  The palliative care team will continue to follow. Please call our office at [phone] if we can be of additional assistance.   Duante Arocho Z Deyvi Bonanno, Dustin [name] ,

## 2021-11-02 ENCOUNTER — Other Ambulatory Visit: Payer: Medicare Other

## 2021-11-02 DIAGNOSIS — Z789 Other specified health status: Secondary | ICD-10-CM

## 2021-11-02 NOTE — Progress Notes (Signed)
Palliative care SW outreached patient to complete telephonic visit.    SW reached patient's SIL [name] and brother [name] via  TC to follow up on PC NP - C. Gusler concerns for patient needs.  Family shares that patient suffers from mental health instability to include Schizophrenia with some paranoia and Bipolar. Families main concern is patients poor hygiene and moving patient out of his mobile home into a senior apartment complex. Family share that [facility] as well as APS is involved with patient care needs.  The following items were discussed/addressed: Patient has a guardianship hearing on 11/24/21, of which family initiated. SW provided family contact information to [employer], they will be outreaching to obtain an application for possible transition after the guardianship hearing, due to patient not being willing to move at this time. With permission of family, SW spoke with [place] with [facility] team who visits him weekly, most recently today 11/02/21. Their RN visits Q 4 weeks. And their Psychiatrist, Charlcie Cradle, visits monthly with her next visit being within the next week.  APS is already involved - [facility] made this report prior to his hospitalization due to bug infestation and poor hygiene. SW left a VM for [employer], APS SW, to see when her next visit may be.  SW discussed goals, reviewed care plan, provided emotional support, used active and reflective listening in the form of reciprocity emotional response. SW normalized families feeling of being overwhelmed with attempting to assist patient despite his MH ailments.  Patient will see PCP 11/04/21, [name] within the next as well as [name] team next week.  Palliative care will continue to monitor and assist with long term care planning as needed.

## 2021-11-04 ENCOUNTER — Inpatient Hospital Stay: Payer: Medicare Other | Admitting: Oncology

## 2021-11-08 ENCOUNTER — Inpatient Hospital Stay: Payer: Medicare Other | Attending: Oncology | Admitting: Oncology

## 2021-11-08 ENCOUNTER — Inpatient Hospital Stay: Payer: Medicare Other

## 2021-11-08 ENCOUNTER — Encounter: Payer: Self-pay | Admitting: Oncology

## 2021-11-08 ENCOUNTER — Other Ambulatory Visit: Payer: Self-pay

## 2021-11-08 VITALS — BP 89/61 | HR 88 | Temp 97.9°F | Ht 67.0 in | Wt 136.0 lb

## 2021-11-08 DIAGNOSIS — Z800 Family history of malignant neoplasm of digestive organs: Secondary | ICD-10-CM | POA: Insufficient documentation

## 2021-11-08 DIAGNOSIS — K5900 Constipation, unspecified: Secondary | ICD-10-CM | POA: Diagnosis not present

## 2021-11-08 DIAGNOSIS — D500 Iron deficiency anemia secondary to blood loss (chronic): Secondary | ICD-10-CM | POA: Diagnosis not present

## 2021-11-08 DIAGNOSIS — Z809 Family history of malignant neoplasm, unspecified: Secondary | ICD-10-CM | POA: Diagnosis not present

## 2021-11-08 DIAGNOSIS — Z801 Family history of malignant neoplasm of trachea, bronchus and lung: Secondary | ICD-10-CM | POA: Diagnosis not present

## 2021-11-08 DIAGNOSIS — C182 Malignant neoplasm of ascending colon: Secondary | ICD-10-CM | POA: Diagnosis not present

## 2021-11-08 DIAGNOSIS — Z8042 Family history of malignant neoplasm of prostate: Secondary | ICD-10-CM | POA: Insufficient documentation

## 2021-11-08 DIAGNOSIS — F17210 Nicotine dependence, cigarettes, uncomplicated: Secondary | ICD-10-CM | POA: Diagnosis not present

## 2021-11-08 LAB — CBC WITH DIFFERENTIAL/PLATELET
Abs Immature Granulocytes: 0.02 10*3/uL (ref 0.00–0.07)
Basophils Absolute: 0 10*3/uL (ref 0.0–0.1)
Basophils Relative: 0 %
Eosinophils Absolute: 0.1 10*3/uL (ref 0.0–0.5)
Eosinophils Relative: 1 %
HCT: 37.8 % — ABNORMAL LOW (ref 39.0–52.0)
Hemoglobin: 11.1 g/dL — ABNORMAL LOW (ref 13.0–17.0)
Immature Granulocytes: 0 %
Lymphocytes Relative: 22 %
Lymphs Abs: 1.5 10*3/uL (ref 0.7–4.0)
MCH: 23.3 pg — ABNORMAL LOW (ref 26.0–34.0)
MCHC: 29.4 g/dL — ABNORMAL LOW (ref 30.0–36.0)
MCV: 79.4 fL — ABNORMAL LOW (ref 80.0–100.0)
Monocytes Absolute: 0.5 10*3/uL (ref 0.1–1.0)
Monocytes Relative: 8 %
Neutro Abs: 4.6 10*3/uL (ref 1.7–7.7)
Neutrophils Relative %: 69 %
Platelets: 184 10*3/uL (ref 150–400)
RBC: 4.76 MIL/uL (ref 4.22–5.81)
RDW: 28.1 % — ABNORMAL HIGH (ref 11.5–15.5)
WBC: 6.7 10*3/uL (ref 4.0–10.5)
nRBC: 0 % (ref 0.0–0.2)

## 2021-11-08 LAB — IRON AND TIBC
Iron: 40 ug/dL — ABNORMAL LOW (ref 45–182)
Saturation Ratios: 11 % — ABNORMAL LOW (ref 17.9–39.5)
TIBC: 374 ug/dL (ref 250–450)
UIBC: 334 ug/dL

## 2021-11-08 LAB — FERRITIN: Ferritin: 12 ng/mL — ABNORMAL LOW (ref 24–336)

## 2021-11-08 MED ORDER — DOCUSATE SODIUM 100 MG PO CAPS: 100.0000 mg | ORAL_CAPSULE | Freq: Every day | ORAL | 1 refills | Status: DC

## 2021-11-08 MED ORDER — FERROUS SULFATE 325 (65 FE) MG PO TBEC: 325.0000 mg | DELAYED_RELEASE_TABLET | Freq: Every day | ORAL | 1 refills | Status: DC

## 2021-11-08 NOTE — Progress Notes (Signed)
Hematology/Oncology Progress note [phone] [phone]     Patient Care Team: Dustin Osgood, NP as PCP - General (Nurse Practitioner)   Name of the patient: Dustin [name]  [id number]  March 23, 1956   REASON FOR VISIT For stage I colon cancer, iron deficiency anemia  INTERVAL HISTORY 10/13/2021, patient underwent right colectomy. Pathology showed invasive colorectal adenocarcinoma arising 8.2 cm tubulovillous adenoma, 2 incidental tubular adenomas, hernia sac excision showed mesothelial lined fibroadipose tissue with congestion and reactive changes consistent with hernia sac.  Negative for malignancy.  pT1pN0.  39 lymph nodes were examined and were negative.  Patient received IV Feraheme during his hospitalization for iron deficiency anemia.  She was also on oral iron supplementation 3 times a day which he did not continue after discharge.  Iron tablets cause constipation Today he was accompanied by one of his brothers, Dustin [name] 2020  .  Patient denies any new complaints.  He feels stronger.  Fatigue is better.  Denies any abdominal pain, blood in stool.  Review of Systems  Constitutional:  Negative for appetite change, chills, fatigue, fever and unexpected weight change.  HENT:   Negative for hearing loss and voice change.   Eyes:  Negative for eye problems and icterus.  Respiratory:  Negative for chest tightness, cough and shortness of breath.   Cardiovascular:  Negative for chest pain and leg swelling.  Gastrointestinal:  Negative for abdominal distention and abdominal pain.  Endocrine: Negative for hot flashes.  Genitourinary:  Negative for difficulty urinating, dysuria and frequency.   Musculoskeletal:  Negative for arthralgias.  Skin:  Negative for itching and rash.  Neurological:  Negative for light-headedness and numbness.  Hematological:  Negative for adenopathy. Does not bruise/bleed easily.  Psychiatric/Behavioral:  Negative for  confusion.      No Known Allergies   Past Medical History:  Diagnosis Date   Schizophrenia [facility])      Past Surgical History:  Procedure Laterality Date   COLONOSCOPY N/A 10/06/2021   Procedure: COLONOSCOPY;  Surgeon: Toledo, Benay Pike, MD;  Location: [facility] ENDOSCOPY;  Service: Gastroenterology;  Laterality: N/A;   ESOPHAGOGASTRODUODENOSCOPY N/A 10/06/2021   Procedure: ESOPHAGOGASTRODUODENOSCOPY (EGD);  Surgeon: Toledo, Benay Pike, MD;  Location: [facility] ENDOSCOPY;  Service: Gastroenterology;  Laterality: N/A;   PARTIAL COLECTOMY  10/13/2021   Procedure: RIGHT COLECTOMY;  Surgeon: Jules Husbands, MD;  Location: [facility] ORS;  Service: General;;    Social History   Socioeconomic History   Marital status: Single    Spouse name: Not on file   Number of children: Not on file   Years of education: Not on file   Highest education level: Not on file  Occupational History   Not on file  Tobacco Use   Smoking status: Every Day    Packs/day: 0.50    Years: 15.00    Pack years: 7.50    Types: Cigarettes   Smokeless tobacco: Never  Vaping Use   Vaping Use: Never used  Substance and Sexual Activity   Alcohol use: Not Currently   Drug use: No   Sexual activity: Never    Birth control/protection: None  Other Topics Concern   Not on file  Social History Narrative   Not on file   Social Determinants of Health   Financial Resource Strain: Not on file  Food Insecurity: Not on file  Transportation Needs: Not on file  Physical Activity: Not on file  Stress: Not on file  Social Connections: Not on file  Intimate  Partner Violence: Not on file    Family History  Problem Relation Age of Onset   Lung cancer Father    Hypertension Brother    Hypertension Brother    Hypertension Brother    Prostate cancer Brother      Current Outpatient Medications:    feeding supplement (ENSURE ENLIVE / ENSURE PLUS) LIQD, Take 237 mLs by mouth 3 (three) times daily between meals., Disp: 237 mL, Rfl:  12   acetaminophen (TYLENOL) 500 MG tablet, Take by mouth. (Patient not taking: Reported on 11/08/2021), Disp: , Rfl:    haloperidol decanoate (HALDOL DECANOATE) 100 MG/ML injection, Inject 1 mL (100 mg total) into the muscle once for 1 dose. Next dose due 5/15, Disp: 1 mL, Rfl: 1   INVEGA SUSTENNA 234 MG/1.5ML injection, Inject into the muscle. (Patient not taking: Reported on 11/08/2021), Disp: , Rfl:    Multiple Vitamin (MULTIVITAMIN WITH MINERALS) TABS tablet, Take 1 tablet by mouth daily. (Patient not taking: Reported on 11/08/2021), Disp: 30 tablet, Rfl: 0   Multiple Vitamin (MULTIVITAMIN) capsule, Take 1 capsule by mouth daily. (Patient not taking: Reported on 11/08/2021), Disp: , Rfl:    nicotine (NICODERM CQ - DOSED IN MG/24 HOURS) 21 mg/24hr patch, Place 1 patch (21 mg total) onto the skin daily. (Patient not taking: Reported on 11/08/2021), Disp: 28 patch, Rfl: 0  Physical exam:  Vitals:   11/08/21 1417  BP: (!) 89/61  Pulse: 88  Temp: 97.9 F (36.6 C)  TempSrc: Tympanic  Weight: 136 lb (61.7 kg)  Height: '5\' 7"'$  (1.702 m)   Physical Exam Constitutional:      General: He is not in acute distress. HENT:     Head: Normocephalic and atraumatic.  Eyes:     General: No scleral icterus. Cardiovascular:     Rate and Rhythm: Normal rate and regular rhythm.     Heart sounds: Normal heart sounds.  Pulmonary:     Effort: Pulmonary effort is normal. No respiratory distress.     Breath sounds: No wheezing.  Abdominal:     General: Bowel sounds are normal. There is no distension.     Palpations: Abdomen is soft.  Musculoskeletal:        General: No deformity. Normal range of motion.     Cervical back: Normal range of motion and neck supple.  Skin:    General: Skin is warm and dry.     Findings: No erythema or rash.  Neurological:     Mental Status: He is alert. Mental status is at baseline.     Cranial Nerves: No cranial nerve deficit.     Coordination: Coordination normal.      Comments: Slow speech  Psychiatric:        Mood and Affect: Mood normal.       Latest Ref Rng & Units 10/15/2021    5:23 AM  CMP  Glucose 70 - 99 mg/dL 100    BUN 8 - 23 mg/dL 18    Creatinine 0.61 - 1.24 mg/dL 1.06    Sodium 135 - 145 mmol/L 137    Potassium 3.5 - 5.1 mmol/L 4.5    Chloride 98 - 111 mmol/L 109    CO2 22 - 32 mmol/L 26    Calcium 8.9 - 10.3 mg/dL 9.0        Latest Ref Rng & Units 11/08/2021    2:46 PM  CBC  WBC 4.0 - 10.5 K/uL 6.7    Hemoglobin 13.0 - 17.0 g/dL  11.1    Hematocrit 39.0 - 52.0 % 37.8    Platelets 150 - 400 K/uL 184      RADIOGRAPHIC STUDIES: I have personally reviewed the radiological images as listed and agreed with the findings in the report. No results found.   Assessment and plan  1. Malignant neoplasm of ascending colon ([place])   2. Iron deficiency anemia due to chronic blood loss   3. Family history of cancer    Cancer Staging  Malignant neoplasm of ascending colon [facility]) Staging form: Colon and Rectum - Neuroendocine Tumors, AJCC 8th Edition - Pathologic stage from 11/08/2021: Stage I (pT1, pN0, cM0) - Signed by Earlie Server, MD on 11/08/2021   #Stage I right colon cancer, pT1b pN0 M0 Pathology report was reviewed and discussed with patient No need for adjuvant chemotherapy Check postop CEA  #Family history of colon cancer, personal history of colon cancer as well as tubular adenomas. Recommend genetic testing.  Refer to Dietitian.  Patient and his brother agree with the plan.  #Iron deficiency anemia, check CBC, iron TIBC ferritin.  -Today's labs indicate improved iron stores The anemia has improved, hemoglobin 11.  Iron saturation 11, ferritin 12 Recommend patient to take oral iron supplementation ferrous sulfate 325 mg daily.  Recommend Colace 100 mg daily for constipation. Prescriptions were sent to pharmacy.  Orders Placed This Encounter  Procedures   Ferritin    Standing Status:   Future    Standing Expiration Date:    05/10/2022   Iron and TIBC    Standing Status:   Future    Standing Expiration Date:   11/09/2022   CBC with Differential/Platelet    Standing Status:   Future    Standing Expiration Date:   11/09/2022   CEA    Standing Status:   Future    Number of Occurrences:   1    Standing Expiration Date:   11/08/2022   CBC with Differential/Platelet    Standing Status:   Future    Number of Occurrences:   1    Standing Expiration Date:   11/09/2022   Iron and TIBC    Standing Status:   Future    Number of Occurrences:   1    Standing Expiration Date:   11/09/2022   Ferritin    Standing Status:   Future    Number of Occurrences:   1    Standing Expiration Date:   11/09/2022   CBC with Differential/Platelet    Standing Status:   Future    Standing Expiration Date:   11/09/2022   Comprehensive metabolic panel    Standing Status:   Future    Standing Expiration Date:   11/09/2022   CEA    Standing Status:   Future    Standing Expiration Date:   11/09/2022   Iron and TIBC    Standing Status:   Future    Standing Expiration Date:   11/09/2022   Ambulatory referral to Genetics    Referral Priority:   Routine    Referral Type:   Consultation    Referral Reason:   Specialty Services Required    Number of Visits Requested:   1    Follow-up in 6 months.   Earlie Server, MD, PhD [facility] Hematology Oncology 11/08/2021

## 2021-11-09 ENCOUNTER — Telehealth: Payer: Self-pay

## 2021-11-09 LAB — CEA: CEA: 6.3 ng/mL — ABNORMAL HIGH (ref 0.0–4.7)

## 2021-11-09 NOTE — Telephone Encounter (Signed)
-----   Message from Earlie Server, MD sent at 11/08/2021 10:52 PM EDT ----- Please let patient know that iron level and blood level have both improved.  However still not normalized yet.  I recommend patient to take ferrous sulfate 325 mg daily.  Also recommend Colace 100 mg daily.  Both prescriptions were sent to pharmacy.  Keep current follow-up plan.

## 2021-11-09 NOTE — Telephone Encounter (Signed)
Called and informed Brother of results and Dr. Collie Siad recommendation. Brother verbalized understanding.

## 2021-11-18 ENCOUNTER — Ambulatory Visit: Payer: Medicare Other | Admitting: Nurse Practitioner

## 2021-11-23 ENCOUNTER — Telehealth: Payer: Self-pay

## 2021-11-23 NOTE — Telephone Encounter (Signed)
Per brother's request, I printed off last two office notes for them to take to court for guardianship. Envelope at front desk-Toni

## 2021-11-30 ENCOUNTER — Telehealth: Payer: Self-pay

## 2021-11-30 ENCOUNTER — Encounter: Payer: Self-pay | Admitting: Nurse Practitioner

## 2021-11-30 ENCOUNTER — Ambulatory Visit (INDEPENDENT_AMBULATORY_CARE_PROVIDER_SITE_OTHER): Payer: Medicare Other | Admitting: Nurse Practitioner

## 2021-11-30 VITALS — BP 96/73 | HR 105 | Temp 98.0°F | Resp 16 | Ht 67.0 in | Wt 127.4 lb

## 2021-11-30 DIAGNOSIS — C189 Malignant neoplasm of colon, unspecified: Secondary | ICD-10-CM

## 2021-11-30 DIAGNOSIS — R2689 Other abnormalities of gait and mobility: Secondary | ICD-10-CM

## 2021-11-30 DIAGNOSIS — M25512 Pain in left shoulder: Secondary | ICD-10-CM | POA: Diagnosis not present

## 2021-11-30 DIAGNOSIS — D500 Iron deficiency anemia secondary to blood loss (chronic): Secondary | ICD-10-CM

## 2021-11-30 DIAGNOSIS — Z0001 Encounter for general adult medical examination with abnormal findings: Secondary | ICD-10-CM | POA: Diagnosis not present

## 2021-11-30 DIAGNOSIS — M6281 Muscle weakness (generalized): Secondary | ICD-10-CM

## 2021-11-30 DIAGNOSIS — F250 Schizoaffective disorder, bipolar type: Secondary | ICD-10-CM

## 2021-11-30 NOTE — Progress Notes (Signed)
[facility] [place], [place] [address]  Internal MEDICINE  Office Visit Note  Patient Name: Dustin [name]  [id number]  [id number]  Date of Service: 11/30/2021  Chief Complaint  Patient presents with   Medicare Wellness    Left shoulder can feel it pulling     HPI Tomi presents for an annual well visit and physical exam.  --well appearing 66 yo male with schizoaffective disorder treated with monthly decanoate injections of invega sustenna and haloperidol.  --established care with me in May this year. Unexplained weight loss and significant symptomatic anemia, hospitalized and a colonic mass was identified and removed and was found to be cancerous.  --his strength has been improving since he was discharged from the hospital. His incision site is healed.  --he was initially very pleasant and in good spirits during the visit today but has started having tearful crying spells. He is exhibiting mood lability, sad and tearful affect, delusions, hallucination and paranoia. His brother and sister are present with him today and state that he gets like this sometimes. He is still considered at baseline regarding his schizoaffective disorder. He is not at risk of harming himself or others.  PSA was checked in may, not due for colonoscopy right now.      Current Medication: Outpatient Encounter Medications as of 11/30/2021  Medication Sig   docusate sodium (COLACE) 100 MG capsule Take 1 capsule (100 mg total) by mouth daily.   feeding supplement (ENSURE ENLIVE / ENSURE PLUS) LIQD Take 237 mLs by mouth 3 (three) times daily between meals.   ferrous sulfate 325 (65 FE) MG EC tablet Take 1 tablet (325 mg total) by mouth daily.   haloperidol decanoate (HALDOL DECANOATE) 100 MG/ML injection Inject 1 mL (100 mg total) into the muscle once for 1 dose. Next dose due 5/15   [DISCONTINUED] acetaminophen (TYLENOL) 500 MG tablet Take by mouth. (Patient not taking: Reported on  11/08/2021)   [DISCONTINUED] INVEGA SUSTENNA 234 MG/1.5ML injection Inject into the muscle. (Patient not taking: Reported on 11/08/2021)   [DISCONTINUED] Multiple Vitamin (MULTIVITAMIN) capsule Take 1 capsule by mouth daily. (Patient not taking: Reported on 11/08/2021)   [DISCONTINUED] nicotine (NICODERM CQ - DOSED IN MG/24 HOURS) 21 mg/24hr patch Place 1 patch (21 mg total) onto the skin daily. (Patient not taking: Reported on 11/08/2021)   No facility-administered encounter medications on file as of 11/30/2021.    Surgical History: Past Surgical History:  Procedure Laterality Date   COLONOSCOPY N/A 10/06/2021   Procedure: COLONOSCOPY;  Surgeon: Toledo, Benay Pike, MD;  Location: [facility] ENDOSCOPY;  Service: Gastroenterology;  Laterality: N/A;   ESOPHAGOGASTRODUODENOSCOPY N/A 10/06/2021   Procedure: ESOPHAGOGASTRODUODENOSCOPY (EGD);  Surgeon: Toledo, Benay Pike, MD;  Location: [facility] ENDOSCOPY;  Service: Gastroenterology;  Laterality: N/A;   PARTIAL COLECTOMY  10/13/2021   Procedure: RIGHT COLECTOMY;  Surgeon: Jules Husbands, MD;  Location: [facility] ORS;  Service: General;;    Medical History: Past Medical History:  Diagnosis Date   Schizophrenia ([place])     Family History: Family History  Problem Relation Age of Onset   Lung cancer Father    Hypertension Brother    Hypertension Brother    Hypertension Brother    Prostate cancer Brother     Social History   Socioeconomic History   Marital status: Single    Spouse name: Not on file   Number of children: Not on file   Years of education: Not on file   Highest education level: Not  on file  Occupational History   Not on file  Tobacco Use   Smoking status: Every Day    Packs/day: 0.50    Years: 15.00    Total pack years: 7.50    Types: Cigarettes   Smokeless tobacco: Never  Vaping Use   Vaping Use: Never used  Substance and Sexual Activity   Alcohol use: Not Currently   Drug use: No   Sexual activity: Never    Birth control/protection:  None  Other Topics Concern   Not on file  Social History Narrative   Not on file   Social Determinants of Health   Financial Resource Strain: Not on file  Food Insecurity: Not on file  Transportation Needs: Not on file  Physical Activity: Not on file  Stress: Not on file  Social Connections: Not on file  Intimate Partner Violence: Not on file      Review of Systems  Constitutional:  Negative for activity change, appetite change, chills, fatigue, fever and unexpected weight change.  HENT: Negative.  Negative for congestion, ear pain, rhinorrhea, sore throat and trouble swallowing.   Eyes: Negative.   Respiratory: Negative.  Negative for cough, chest tightness, shortness of breath and wheezing.   Cardiovascular: Negative.  Negative for chest pain and palpitations.  Gastrointestinal: Negative.  Negative for abdominal pain, blood in stool, constipation, diarrhea, nausea and vomiting.  Endocrine: Negative.   Genitourinary: Negative.  Negative for difficulty urinating, dysuria, frequency, hematuria and urgency.  Musculoskeletal: Negative.  Negative for arthralgias, back pain, joint swelling, myalgias and neck pain.  Skin: Negative.  Negative for rash and wound.  Allergic/Immunologic: Negative.  Negative for immunocompromised state.  Neurological:  Positive for weakness. Negative for dizziness, seizures, numbness and headaches.  Hematological: Negative.   Psychiatric/Behavioral:  Positive for behavioral problems, dysphoric mood, hallucinations and sleep disturbance. Negative for self-injury and suicidal ideas.     Vital Signs: BP 96/73   Pulse (!) 105   Temp 98 F (36.7 C)   Resp 16   Ht '5\' 7"'$  (1.702 m)   Wt 127 lb 6.4 oz (57.8 kg)   SpO2 99%   BMI 19.95 kg/m    Physical Exam Vitals reviewed.  Constitutional:      General: He is not in acute distress.    Appearance: He is well-developed. He is not diaphoretic.  HENT:     Head: Normocephalic and atraumatic.     Right  Ear: External ear normal. There is impacted cerumen.     Left Ear: External ear normal. There is impacted cerumen.     Nose: Nose normal. No congestion or rhinorrhea.     Mouth/Throat:     Mouth: Mucous membranes are moist.     Pharynx: Oropharynx is clear. No oropharyngeal exudate or posterior oropharyngeal erythema.  Eyes:     General: No scleral icterus.       Right eye: No discharge.        Left eye: No discharge.     Conjunctiva/sclera: Conjunctivae normal.     Pupils: Pupils are equal, round, and reactive to light.  Neck:     Thyroid: No thyromegaly.     Vascular: No JVD.     Trachea: No tracheal deviation.  Cardiovascular:     Rate and Rhythm: Normal rate and regular rhythm.     Heart sounds: Normal heart sounds. No murmur heard.    No friction rub. No gallop.  Pulmonary:     Effort: Pulmonary effort is normal. No  respiratory distress.     Breath sounds: Normal breath sounds. No stridor. No wheezing or rales.  Chest:     Chest wall: No tenderness.  Abdominal:     General: Bowel sounds are normal. There is no distension.     Palpations: Abdomen is soft. There is no mass.     Tenderness: There is no abdominal tenderness. There is no guarding or rebound.     Hernia: No hernia is present.  Musculoskeletal:        General: No tenderness or deformity. Normal range of motion.     Cervical back: Normal range of motion and neck supple.  Lymphadenopathy:     Cervical: No cervical adenopathy.  Skin:    General: Skin is warm and dry.     Coloration: Skin is not pale.     Findings: No erythema or rash.  Neurological:     Mental Status: He is alert. Mental status is at baseline.     Cranial Nerves: No cranial nerve deficit.     Motor: Weakness present. No abnormal muscle tone.     Coordination: Coordination normal.     Gait: Gait abnormal.     Deep Tendon Reflexes: Reflexes are normal and symmetric.  Psychiatric:        Attention and Perception: He perceives auditory  hallucinations.        Mood and Affect: Mood is depressed. Affect is labile and tearful.        Speech: Speech is delayed.        Behavior: Behavior is slowed. Behavior is cooperative.        Thought Content: Thought content is paranoid and delusional. Thought content does not include homicidal or suicidal ideation.        Judgment: Judgment normal.        Assessment/Plan: 1. Encounter for routine adult health examination with abnormal findings Age-appropriate preventive screenings and vaccinations discussed, annual physical exam completed. No labs due at this time. All preventive screenings are UTD. PHM updated.   2. Adenocarcinoma of colon ([place]) Surgically removed during hospital stay. No other treatment but is being followed and monitored by heme/onc  3. Iron deficiency anemia due to chronic blood loss Followed by hematology  4. Impairment of balance Has a walker, has been going to physical therapy  5. Generalized muscle weakness Going to physical therapy   6. Schizoaffective disorder, bipolar type ([place]) At baseline, sees psych  7. Acute pain of left shoulder Refer to ortho, may be impingement or  - Ambulatory referral to Orthopedic Surgery      General Counseling: keion neels understanding of the findings of todays visit and agrees with plan of treatment. I have discussed any further diagnostic evaluation that may be needed or ordered today. We also reviewed his medications today. he has been encouraged to call the office with any questions or concerns that should arise related to todays visit.    Orders Placed This Encounter  Procedures   Ambulatory referral to Orthopedic Surgery    No orders of the defined types were placed in this encounter.   Return in about 3 months (around 03/02/2022) for F/U, Ayslin Kundert PCP.   Total time spent:30 Minutes Time spent includes review of chart, medications, test results, and follow up plan with the patient.   [place]  Controlled Substance Database was reviewed by me.  This patient was seen by Jonetta Osgood, FNP-C in collaboration with Dr. Clayborn Bigness as a part of collaborative care agreement.  Kaleena Corrow R. Valetta Fuller, MSN, FNP-C Internal medicine

## 2021-11-30 NOTE — Telephone Encounter (Signed)
Awaiting 11/30/21 office notes for urgent orthopedic surgery referral-Dustin [name]

## 2021-12-05 ENCOUNTER — Telehealth: Payer: Self-pay

## 2021-12-05 NOTE — Telephone Encounter (Signed)
Faxed [facility]  for Three prong and adjustable shower seat

## 2021-12-13 ENCOUNTER — Encounter: Payer: Self-pay | Admitting: Nurse Practitioner

## 2021-12-13 DIAGNOSIS — R269 Unspecified abnormalities of gait and mobility: Secondary | ICD-10-CM | POA: Diagnosis not present

## 2021-12-13 DIAGNOSIS — C189 Malignant neoplasm of colon, unspecified: Secondary | ICD-10-CM | POA: Insufficient documentation

## 2021-12-14 ENCOUNTER — Encounter: Payer: Self-pay | Admitting: Nurse Practitioner

## 2021-12-14 ENCOUNTER — Other Ambulatory Visit: Payer: Medicare Other | Admitting: Nurse Practitioner

## 2021-12-14 ENCOUNTER — Telehealth: Payer: Self-pay

## 2021-12-14 DIAGNOSIS — R5381 Other malaise: Secondary | ICD-10-CM | POA: Diagnosis not present

## 2021-12-14 DIAGNOSIS — Z515 Encounter for palliative care: Secondary | ICD-10-CM

## 2021-12-14 DIAGNOSIS — R451 Restlessness and agitation: Secondary | ICD-10-CM | POA: Diagnosis not present

## 2021-12-14 DIAGNOSIS — F250 Schizoaffective disorder, bipolar type: Secondary | ICD-10-CM

## 2021-12-14 NOTE — Telephone Encounter (Signed)
Urgent ortho referral sent via [facility] to [facility]

## 2021-12-14 NOTE — Progress Notes (Addendum)
Therapist, nutritional Palliative Care Consult Note Telephone: [phone]  Fax: [phone]    Date of encounter: 12/14/21 7:07 PM PATIENT NAME: Dustin [name] [address] [place] [address]   [phone] (home)  DOB: 19-Feb-1956 MRN: [id number] PRIMARY CARE PROVIDER:    Sallyanne Kuster, NP,  [address] [place] [address] [phone]  RESPONSIBLE PARTY:    Contact Information     Name Relation Home Work Rentz Other   [phone]   Dustin [name] Brother   [phone]      I met face to face with patient and family in home. Palliative Care was asked to follow this patient by consultation request of  Dustin Kuster, NP to address advance care planning and complex medical decision making. This is a follow up visit.                                  ASSESSMENT AND PLAN / RECOMMENDATIONS:  Symptom Management/Plan: 1. Advance Care Planning;  Full code, full scope of treatment   2. Agitation, secondary to Schizophrenia, refusing to do self-care; [facility]   3. Debility secondary to decompensation with plan for placement in a group home. Will have PC SW contact Dustin [name] to see if FL2 is completed and where the process is of placement, if needed will complete FL2   4. Goals of Care: Goals include to maximize quality of life and symptom management. Our advance care planning conversation included a discussion about:    The value and importance of advance care planning  Exploration of personal, cultural or spiritual beliefs that might influence medical decisions  Exploration of goals of care in the event of a sudden injury or illness  Identification and preparation of a healthcare agent  Review and updating or creation of an advance directive document.   5. Palliative care encounter; Palliative care encounter; Palliative medicine team will continue to support patient, patient's family, and medical  team. Visit consisted of counseling and education dealing with the complex and emotionally intense issues of symptom management and palliative care in the setting of serious and potentially life-threatening illness  Follow up Palliative Care Visit: Palliative care will continue to follow for complex medical decision making, advance care planning, and clarification of goals. Return 4 weeks or prn.  I spent 63 minutes providing this consultation. More than 50% of the time in this consultation was spent in counseling and care coordination. PPS: 50% Chief Complaint: Follow up palliative consult for complex medical decision making, address goals, manage ongoing symptoms HISTORY OF PRESENT ILLNESS:  Dustin [name] is a 66 y.o. year old male  with multiple medical problems including Schizoaffective disorder, bipolar type, tardive dyskinesia, IDA, microcytic anemia, history of pharyngeal esophageal dysphagia, (10/13/2021) Right colectomy for colon mass, tobacco abuse. [name] was hospitalized from 10/05/2021 to 10/18/2021 for colonic mass,   Iron deficiency anemia due to chronic blood loss with hgb 6.2 requiring transfusion, noted 15 to 20 lb weight loss over last 6 months. GI consulted with Endo/colonscopy with incidental diverticula with partially obstructing colonic mass in ascending colon with biopsies tubulovillous adenoma, not adenocarcinoma of colon which was removed during hospitalization. Psychiatry did consult due to hallucinations, psychosis behaviors. I visited [name] in his home with his brother, Dustin [name] and Dustin [name] his sister-in Social worker. [name] and I entered the home, was very cluttered, unsanitary environment. There were multiple  drawings in books with papers with pages completely covered in writing, colors, patterns. [name] was sitting on the couch, he made eye contact, was pleasant, interactive. [name] appeared disheveled. [name] endorses he has taken a bath, showed a calendar with home  appointments for psychiatry. We talked about how he was feeling, ros, symptoms. Mr. Pachon endorses he was feeling good. He talked about his bipolar-schizoaffective disorder and seeing psychiatry, he talked about the "injection" he is on to help keep his thoughts from becoming out of hand. [name] talked about his pictures, drawings he does, attempting to make a light out of an ensure bottle with wires. Mr. Rauschenberger talked about his brothers and how [name] was able to become his guardian. [name] talked about his living area. We talked about appetite, nutrition. Mr. View asked [name] to pick him up chocolate ensure and cigarettes. Mr. Bonder was friendly, cooperative with assessment. Encouraged [name] to be compliant with his medications, medications reviewed and hygiene. Support provided. I talked about [name] and Dustin [name] separately, looking for placement in group home immediately out of concern for [name] well being. Dustin [name] endorses he lets women come into the trailer, concern someone would take advantage of him as it has become more difficult to care and monitor [name] in his independent living space especially with compliance to appointments, allowing professionals to enter the home, medication compliance and refusing to bath, hygiene for weeks upon weeks per [place]. Will ask PC SW to contact [facility] to see where they are in the process of placement and if need FL2 completed, will complete if needed.   We talked about  medical goals, role pc in poc. We talked about f/u visit with PC NP, scheduled. Therapeutic listening, emotional support provided, questions answered.  [name]   History obtained from review of EMR, discussion with primary team, and interview with family, facility staff/caregiver and/or [name].  I reviewed available labs, medications, imaging, studies and related documents from the EMR.  Records reviewed and summarized above.   ROS 10 point system reviewed all negative except  HPI  Physical Exam: Constitutional: NAD General: frail appearing, thin, male EYES: lids intact ENMT: oral mucous membranes moist CV: S1S2, RRR, no LE edema Pulmonary: LCTA, no increased work of breathing, no cough, room air MSK: ambulatory Skin: warm and dry Neuro:  no generalized weakness,  + cognitive impairment Psych: non-anxious affect, A and O x 2  Thank you for the opportunity to participate in the care of Mr. Durley.  The palliative care team will continue to follow. Please call our office at [phone] if we can be of additional assistance.   Aadarsh Cozort Z Humzah Harty, NP   COVID-19 PATIENT SCREENING TOOL Asked and negative response unless otherwise noted:   Have you had symptoms of covid, tested positive or been in contact with someone with symptoms/positive test in the past 5-10 days?  no

## 2021-12-15 ENCOUNTER — Telehealth: Payer: Self-pay

## 2021-12-15 NOTE — Telephone Encounter (Signed)
PC SW outreached [address] case manager, Virgilio Belling [phone], in regard to [facility] status.    Call unsuccessful. SW LVM.

## 2021-12-15 NOTE — Telephone Encounter (Signed)
PC SW outreached patients APS caseworker, Steffanie Dunn, I n regard to patients's family/guardian inquiring about placement process to a group home. Joy shared that she will be following up with them to address these questions/concerns.  Joy will also f/u on FL2 needed for placement with [name] and or PCP.

## 2021-12-15 NOTE — Telephone Encounter (Signed)
Ortho appointment 12/19/21 @ 3:30 at [facility]

## 2021-12-19 DIAGNOSIS — M25512 Pain in left shoulder: Secondary | ICD-10-CM | POA: Diagnosis not present

## 2021-12-28 ENCOUNTER — Encounter: Payer: Medicare Other | Admitting: Surgery

## 2021-12-29 ENCOUNTER — Other Ambulatory Visit: Payer: Self-pay

## 2021-12-29 ENCOUNTER — Emergency Department
Admission: EM | Admit: 2021-12-29 | Discharge: 2021-12-29 | Disposition: A | Discharge status: Home / Self Care | Payer: Medicare Other | Attending: Emergency Medicine | Admitting: Emergency Medicine

## 2021-12-29 DIAGNOSIS — Z79899 Other long term (current) drug therapy: Secondary | ICD-10-CM | POA: Diagnosis not present

## 2021-12-29 DIAGNOSIS — F17200 Nicotine dependence, unspecified, uncomplicated: Secondary | ICD-10-CM | POA: Diagnosis present

## 2021-12-29 DIAGNOSIS — R5381 Other malaise: Secondary | ICD-10-CM | POA: Diagnosis not present

## 2021-12-29 DIAGNOSIS — R531 Weakness: Secondary | ICD-10-CM | POA: Insufficient documentation

## 2021-12-29 DIAGNOSIS — Z743 Need for continuous supervision: Secondary | ICD-10-CM | POA: Diagnosis not present

## 2021-12-29 DIAGNOSIS — R202 Paresthesia of skin: Secondary | ICD-10-CM | POA: Insufficient documentation

## 2021-12-29 DIAGNOSIS — F250 Schizoaffective disorder, bipolar type: Secondary | ICD-10-CM | POA: Diagnosis not present

## 2021-12-29 DIAGNOSIS — Z046 Encounter for general psychiatric examination, requested by authority: Secondary | ICD-10-CM | POA: Diagnosis present

## 2021-12-29 DIAGNOSIS — F209 Schizophrenia, unspecified: Secondary | ICD-10-CM

## 2021-12-29 DIAGNOSIS — G459 Transient cerebral ischemic attack, unspecified: Secondary | ICD-10-CM | POA: Diagnosis not present

## 2021-12-29 LAB — CBC WITH DIFFERENTIAL/PLATELET
Abs Immature Granulocytes: 0.03 10*3/uL (ref 0.00–0.07)
Basophils Absolute: 0 10*3/uL (ref 0.0–0.1)
Basophils Relative: 0 %
Eosinophils Absolute: 0 10*3/uL (ref 0.0–0.5)
Eosinophils Relative: 0 %
HCT: 37.3 % — ABNORMAL LOW (ref 39.0–52.0)
Hemoglobin: 11.4 g/dL — ABNORMAL LOW (ref 13.0–17.0)
Immature Granulocytes: 0 %
Lymphocytes Relative: 17 %
Lymphs Abs: 1.2 10*3/uL (ref 0.7–4.0)
MCH: 24.7 pg — ABNORMAL LOW (ref 26.0–34.0)
MCHC: 30.6 g/dL (ref 30.0–36.0)
MCV: 80.9 fL (ref 80.0–100.0)
Monocytes Absolute: 0.6 10*3/uL (ref 0.1–1.0)
Monocytes Relative: 8 %
Neutro Abs: 5.3 10*3/uL (ref 1.7–7.7)
Neutrophils Relative %: 75 %
Platelets: 237 10*3/uL (ref 150–400)
RBC: 4.61 MIL/uL (ref 4.22–5.81)
RDW: 18.6 % — ABNORMAL HIGH (ref 11.5–15.5)
WBC: 7.2 10*3/uL (ref 4.0–10.5)
nRBC: 0 % (ref 0.0–0.2)

## 2021-12-29 LAB — BASIC METABOLIC PANEL
Anion gap: 7 (ref 5–15)
BUN: 14 mg/dL (ref 8–23)
CO2: 22 mmol/L (ref 22–32)
Calcium: 9.3 mg/dL (ref 8.9–10.3)
Chloride: 109 mmol/L (ref 98–111)
Creatinine, Ser: 0.97 mg/dL (ref 0.61–1.24)
GFR, Estimated: >60 mL/min (ref >60)
Glucose, Bld: 93 mg/dL (ref 70–99)
Potassium: 4.1 mmol/L (ref 3.5–5.1)
Sodium: 138 mmol/L (ref 135–145)

## 2021-12-29 LAB — ACETAMINOPHEN LEVEL: Acetaminophen (Tylenol), Serum: <10 ug/mL — ABNORMAL LOW (ref 10–30)

## 2021-12-29 LAB — SALICYLATE LEVEL: Salicylate Lvl: <7 mg/dL — ABNORMAL LOW (ref 7.0–30.0)

## 2021-12-29 LAB — ETHANOL: Alcohol, Ethyl (B): <10 mg/dL (ref <10)

## 2021-12-29 NOTE — ED Notes (Signed)
ED Provider at bedside. 

## 2021-12-29 NOTE — ED Notes (Signed)
Spoke with pt's brother and he states he will come pick him up.

## 2021-12-29 NOTE — ED Triage Notes (Signed)
Pt arrives to ED from home via [facility] with c/c of left upper extremity weakness and requesting psych help because "my brother says I'm crazy" Pt refused vitals with EMS and has a Hx of schizophrenia. Upon arrival, pt A&Ox4, NAD. During assessment, pt mumbling some nonsensical word phrases when not answering direct questions but was able to listen and answer orientation questions appropriately.

## 2021-12-29 NOTE — Discharge Instructions (Signed)
Continue taking all of your medications as prescribed

## 2021-12-29 NOTE — ED Notes (Signed)
ED Provider at bedside speaking with the brother who is here to pick up the patient.

## 2021-12-29 NOTE — ED Provider Notes (Signed)
[facility] Provider Note    Event Date/Time   First MD Initiated Contact with Patient 12/29/21 1837     (approximate)   History   Chief Complaint: Psychiatric Evaluation and Extremity Weakness   HPI  Dustin [name] is a 66 y.o. male with a history of schizophrenia who comes the ED today due to paresthesia in the left arm that started today after sleeping on the arm.  He denies any chest pain or shortness of breath, no exertional symptoms.  Denies any loss of strength or headache or trauma.  He also reports that his neighbor is interfering with his television using their laptop and satellite dish and that everyone communicates through [employer].     Physical Exam   Triage Vital Signs: ED Triage Vitals  Enc Vitals Group     BP 12/29/21 1851 92/69     Pulse Rate 12/29/21 1851 92     Resp 12/29/21 1851 17     Temp 12/29/21 1851 98.1 F (36.7 C)     Temp Source 12/29/21 1851 Oral     SpO2 12/29/21 1851 95 %     Weight 12/29/21 1852 135 lb (61.2 kg)     Height 12/29/21 1852 '5\' 8"'$  (1.727 m)     Head Circumference --      Peak Flow --      Pain Score 12/29/21 1852 0     Pain Loc --      Pain Edu? --      Excl. in [place]? --     Most recent vital signs: Vitals:   12/29/21 1851  BP: 92/69  Pulse: 92  Resp: 17  Temp: 98.1 F (36.7 C)  SpO2: 95%    General: Awake, no distress.  CV:  Good peripheral perfusion.  Regular rate and rhythm Resp:  Normal effort.  Clear to auscultation bilateral Abd:  No distention.  Soft nontender Other:  Full range of motion.  Normal strength.  No drift.  Cranial nerves III through XII intact.  Normal cerebellar function.   ED Results / Procedures / Treatments   Labs (all labs ordered are listed, but only abnormal results are displayed) Labs Reviewed  CBC WITH DIFFERENTIAL/PLATELET - Abnormal; Notable for the following components:      Result Value   Hemoglobin 11.4 (*)    HCT 37.3 (*)    MCH 24.7 (*)     RDW 18.6 (*)    All other components within normal limits  SALICYLATE LEVEL - Abnormal; Notable for the following components:   Salicylate Lvl <5.0 (*)    All other components within normal limits  ACETAMINOPHEN LEVEL - Abnormal; Notable for the following components:   Acetaminophen (Tylenol), Serum <10 (*)    All other components within normal limits  BASIC METABOLIC PANEL  ETHANOL  URINE DRUG SCREEN, QUALITATIVE ([facility] ONLY)     EKG Interpreted by me Sinus rhythm rate of 96.  Normal axis and intervals.  Poor R wave progression.  Normal ST segments and T waves.  No ischemic changes.   RADIOLOGY    PROCEDURES:  Procedures   MEDICATIONS ORDERED IN ED: Medications - No data to display   IMPRESSION / MDM / [place] / ED COURSE  I reviewed the triage vital signs and the nursing notes.  Differential diagnosis includes, but is not limited to, electrolyte abnormality, AKI, intoxication, chronic schizophrenia  Patient's presentation is most consistent with acute complicated illness / injury requiring diagnostic workup.   Patient presents with left arm paresthesia which I think is due to nerve compression while sleeping. Considering the patient's symptoms, medical history, and physical examination today, I have low suspicion for ACS, PE, TAD, pneumothorax, carditis, mediastinitis, pneumonia, CHF, or sepsis.  He denies SI or HI, does not appear to be a danger to himself or others.  I did ask the behavioral medicine team to evaluate the patient due to his schizophrenia.  They contacted the patient's ACT team who says that this is his baseline and they are not concerned about the symptoms.  He is medically and psychiatrically stable for discharge.      FINAL CLINICAL IMPRESSION(S) / ED DIAGNOSES   Final diagnoses:  Schizophrenia, unspecified type ([place])  Arm paresthesia, left     Rx / DC Orders   ED Discharge Orders     None         Note:  This document was prepared using Dragon voice recognition software and may include unintentional dictation errors.   Carrie Mew, MD 12/29/21 2244

## 2021-12-30 NOTE — Consult Note (Signed)
[facility] Consult   Reason for Consult:Psychiatric Evaluation and Extremity Weakness Referring Physician: Dr. Joni Fears Patient Identification: Dustin [name] MRN:  [id number] Principal Diagnosis: <principal problem not specified> Diagnosis:  Active Problems:   Tobacco use disorder   Schizoaffective disorder, bipolar type ([place])   Total Time spent with patient: 1 hour  Subjective: "My brother said I am crazy." Dustin [name] is a 66 y.o. male patient presented to [facility] ED via [facility] from home voluntary. From the ED triage nurses note, Pt arrives to ED from home via [facility] with c/c of left upper extremity weakness and requesting psych help because "my brother says I'm crazy" Pt refused vitals with EMS and has a Hx of schizophrenia. Upon arrival, pt A&Ox4, NAD. During assessment, pt mumbling some nonsensical word phrases when not answering direct questions but was able to listen and answer orientation questions appropriately.   This provider saw The patient face-to-face; the chart was reviewed, and consulted with Dr. Joni Fears on 12/30/2021 due to the patient's care. It was discussed with the EDP that the patient does not meet the criteria to be admitted to the psychiatric inpatient unit.  On evaluation, the patient is alert and oriented x 2-3, anxious but cooperative, and mood-congruent with affect. The patient does not appear to be responding to internal or external stimuli. The patient is presenting with delusional thinking. The patient admits to auditory hallucinations. He states, "There is nothing wrong with communicating through your teeth." The patient denies any suicidal, homicidal, or self-harm ideations. The patient is presenting with some psychotic and paranoid behaviors. Collateral was obtained from the patient's ACCT- team, who voiced that the patient is at baseline and he has been doing well. He recently had some surgeries and has recovered. The ACTT-team staff stated  that the patient had not been hospitalized for some time, and they checked on him, and there were no safety concerns.  HPI: Per Dr. Joni Fears, Dustin [name] is a 66 y.o. male with a history of schizophrenia who comes the ED today due to paresthesia in the left arm that started today after sleeping on the arm.  He denies any chest pain or shortness of breath, no exertional symptoms.  Denies any loss of strength or headache or trauma. He also reports that his neighbor is interfering with his television using their laptop and satellite dish and that everyone communicates through Their Teeth.  Past Psychiatric History:  Schizophrenia ([place])  Risk to Self:   Risk to Others:   Prior Inpatient Therapy:   Prior Outpatient Therapy:    Past Medical History:  Past Medical History:  Diagnosis Date   Schizophrenia [facility])     Past Surgical History:  Procedure Laterality Date   COLONOSCOPY N/A 10/06/2021   Procedure: COLONOSCOPY;  Surgeon: Toledo, Benay Pike, MD;  Location: [facility] ENDOSCOPY;  Service: Gastroenterology;  Laterality: N/A;   ESOPHAGOGASTRODUODENOSCOPY N/A 10/06/2021   Procedure: ESOPHAGOGASTRODUODENOSCOPY (EGD);  Surgeon: Toledo, Benay Pike, MD;  Location: [facility] ENDOSCOPY;  Service: Gastroenterology;  Laterality: N/A;   PARTIAL COLECTOMY  10/13/2021   Procedure: RIGHT COLECTOMY;  Surgeon: Jules Husbands, MD;  Location: [facility] ORS;  Service: General;;   Family History:  Family History  Problem Relation Age of Onset   Lung cancer Father    Hypertension Brother    Hypertension Brother    Hypertension Brother    Prostate cancer Brother    Family Psychiatric  History:  Social History:  Social History   Substance and  Sexual Activity  Alcohol Use Not Currently     Social History   Substance and Sexual Activity  Drug Use No    Social History   Socioeconomic History   Marital status: Single    Spouse name: Not on file   Number of children: Not on file   Years of education: Not on  file   Highest education level: Not on file  Occupational History   Not on file  Tobacco Use   Smoking status: Every Day    Packs/day: 0.50    Years: 15.00    Total pack years: 7.50    Types: Cigarettes   Smokeless tobacco: Never  Vaping Use   Vaping Use: Never used  Substance and Sexual Activity   Alcohol use: Not Currently   Drug use: No   Sexual activity: Never    Birth control/protection: None  Other Topics Concern   Not on file  Social History Narrative   Not on file   Social Determinants of Health   Financial Resource Strain: Not on file  Food Insecurity: Not on file  Transportation Needs: Not on file  Physical Activity: Not on file  Stress: Not on file  Social Connections: Not on file   Additional Social History:    Allergies:  No Known Allergies  Labs:  Results for orders placed or performed during the hospital encounter of 12/29/21 (from the past 48 hour(s))  Basic metabolic panel     Status: None   Collection Time: 12/29/21  8:22 PM  Result Value Ref Range   Sodium 138 135 - 145 mmol/L   Potassium 4.1 3.5 - 5.1 mmol/L   Chloride 109 98 - 111 mmol/L   CO2 22 22 - 32 mmol/L   Glucose, Bld 93 70 - 99 mg/dL    Comment: Glucose reference range applies only to samples taken after fasting for at least 8 hours.   BUN 14 8 - 23 mg/dL   Creatinine, Ser 0.97 0.61 - 1.24 mg/dL   Calcium 9.3 8.9 - 10.3 mg/dL   GFR, Estimated >60 >60 mL/min    Comment: (NOTE) Calculated using the CKD-EPI Creatinine Equation (2021)    Anion gap 7 5 - 15    Comment: Performed at [facility], [address] [place], [place] [address]  CBC with Differential     Status: Abnormal   Collection Time: 12/29/21  8:22 PM  Result Value Ref Range   WBC 7.2 4.0 - 10.5 K/uL   RBC 4.61 4.22 - 5.81 MIL/uL   Hemoglobin 11.4 (L) 13.0 - 17.0 g/dL   HCT 37.3 (L) 39.0 - 52.0 %   MCV 80.9 80.0 - 100.0 fL   MCH 24.7 (L) 26.0 - 34.0 pg   MCHC 30.6 30.0 - 36.0 g/dL   RDW 18.6 (H) 11.5  - 15.5 %   Platelets 237 150 - 400 K/uL   nRBC 0.0 0.0 - 0.2 %   Neutrophils Relative % 75 %   Neutro Abs 5.3 1.7 - 7.7 K/uL   Lymphocytes Relative 17 %   Lymphs Abs 1.2 0.7 - 4.0 K/uL   Monocytes Relative 8 %   Monocytes Absolute 0.6 0.1 - 1.0 K/uL   Eosinophils Relative 0 %   Eosinophils Absolute 0.0 0.0 - 0.5 K/uL   Basophils Relative 0 %   Basophils Absolute 0.0 0.0 - 0.1 K/uL   Immature Granulocytes 0 %   Abs Immature Granulocytes 0.03 0.00 - 0.07 K/uL    Comment:  Performed at [facility], [address] [place], [place] [address]  Ethanol     Status: None   Collection Time: 12/29/21  8:22 PM  Result Value Ref Range   Alcohol, Ethyl (B) <10 <10 mg/dL    Comment: (NOTE) Lowest detectable limit for serum alcohol is 10 mg/dL.  For medical purposes only. Performed at [facility], [address] [place], [place] [address]   Salicylate level     Status: Abnormal   Collection Time: 12/29/21  8:22 PM  Result Value Ref Range   Salicylate Lvl <4.1 (L) 7.0 - 30.0 mg/dL    Comment: Performed at [facility], [address] [place], [place] [address]  Acetaminophen level     Status: Abnormal   Collection Time: 12/29/21  8:22 PM  Result Value Ref Range   Acetaminophen (Tylenol), Serum <10 (L) 10 - 30 ug/mL    Comment: (NOTE) Therapeutic concentrations vary significantly. A range of 10-30 ug/mL  may be an effective concentration for many patients. However, some  are best treated at concentrations outside of this range. Acetaminophen concentrations >150 ug/mL at 4 hours after ingestion  and >50 ug/mL at 12 hours after ingestion are often associated with  toxic reactions.  Performed at [facility], [address] [place], [place] [address]     No current facility-administered medications for this encounter.   Current Outpatient Medications  Medication Sig Dispense Refill   docusate sodium (COLACE) 100 MG capsule Take 1 capsule  (100 mg total) by mouth daily. 90 capsule 1   feeding supplement (ENSURE ENLIVE / ENSURE PLUS) LIQD Take 237 mLs by mouth 3 (three) times daily between meals. 237 mL 12   ferrous sulfate 325 (65 FE) MG EC tablet Take 1 tablet (325 mg total) by mouth daily. 90 tablet 1   INVEGA SUSTENNA 234 MG/1.5ML injection Inject into the muscle.     haloperidol decanoate (HALDOL DECANOATE) 100 MG/ML injection Inject 1 mL (100 mg total) into the muscle once for 1 dose. Next dose due 5/15 1 mL 1    Musculoskeletal: Strength & Muscle Tone: within normal limits Gait & Station: normal Patient leans: N/A  Psychiatric Specialty Exam:  Presentation  General Appearance: Bizarre  Eye Contact:Fair  Speech:Garbled; Pressured  Speech Volume:Increased  Handedness:Right   Mood and Affect  Mood:Anxious; Irritable  Affect:Inappropriate; Full Range   Thought Process  Thought Processes:Disorganized  Descriptions of Associations:Tangential  Orientation:Partial  Thought Content:Illogical  History of Schizophrenia/Schizoaffective disorder:No data recorded Duration of Psychotic Symptoms:No data recorded Hallucinations:Hallucinations: Auditory  Ideas of Reference:Paranoia; Delusions  Suicidal Thoughts:Suicidal Thoughts: No  Homicidal Thoughts:Homicidal Thoughts: No   Sensorium  Memory:Immediate Poor; Recent Poor; Remote Poor  Judgment:Impaired  Insight:Lacking   Executive Functions  Concentration:Poor  Attention Span:Poor  Recall:Poor  Fund of Knowledge:Poor  Language:Poor   Psychomotor Activity  Psychomotor Activity:Psychomotor Activity: Normal   Assets  Assets:Communication Skills; Desire for Improvement; Social Support; Physical Health; Resilience   Sleep  Sleep:Sleep: Fair   Physical Exam: Physical Exam Vitals and nursing note reviewed.  Constitutional:      Appearance: Normal appearance. He is normal weight.  HENT:     Head: Normocephalic and atraumatic.      Right Ear: External ear normal.     Left Ear: External ear normal.     Nose: Nose normal.     Mouth/Throat:     Mouth: Mucous membranes are moist.  Cardiovascular:     Rate and Rhythm: Normal rate.  Pulses: Normal pulses.  Pulmonary:     Effort: Pulmonary effort is normal.  Musculoskeletal:        General: Normal range of motion.     Cervical back: Normal range of motion and neck supple.  Neurological:     General: No focal deficit present.     Mental Status: He is alert. Mental status is at baseline.  Psychiatric:        Attention and Perception: He is inattentive. He perceives auditory and visual hallucinations.        Mood and Affect: Affect is blunt and inappropriate.        Speech: Speech is rapid and pressured and tangential.        Behavior: Behavior is agitated, withdrawn and hyperactive.        Thought Content: Thought content is paranoid and delusional.        Cognition and Memory: Cognition is impaired. Memory is impaired.        Judgment: Judgment is impulsive and inappropriate.    Review of Systems  Psychiatric/Behavioral:  Positive for hallucinations. The patient is nervous/anxious.   All other systems reviewed and are negative.  Blood pressure 121/79, pulse 68, temperature 98.1 F (36.7 C), temperature source Oral, resp. rate 16, height '5\' 8"'$  (1.727 m), weight 61.2 kg, SpO2 99 %. Body mass index is 20.53 kg/m.  Treatment Plan Summary: Plan Patient does not meet criteria for psychiatric inpatient admission  Disposition: No evidence of imminent risk to self or others at present.   Patient does not meet criteria for psychiatric inpatient admission. Supportive therapy provided about ongoing stressors. Discussed crisis plan, support from social network, calling 911, coming to the Emergency Department, and calling Suicide Hotline.  Caroline Sauger, NP 12/30/2021 12:16 AM

## 2022-01-02 ENCOUNTER — Encounter: Payer: Self-pay | Admitting: Nurse Practitioner

## 2022-01-02 ENCOUNTER — Encounter: Payer: Medicare Other | Admitting: Surgery

## 2022-01-02 ENCOUNTER — Ambulatory Visit (INDEPENDENT_AMBULATORY_CARE_PROVIDER_SITE_OTHER): Payer: Medicare Other | Admitting: Physician Assistant

## 2022-01-02 VITALS — BP 86/74 | HR 117 | Temp 98.6°F | Resp 16 | Ht 67.0 in | Wt 128.4 lb

## 2022-01-02 DIAGNOSIS — R531 Weakness: Secondary | ICD-10-CM

## 2022-01-02 DIAGNOSIS — R29898 Other symptoms and signs involving the musculoskeletal system: Secondary | ICD-10-CM | POA: Diagnosis not present

## 2022-01-02 DIAGNOSIS — F209 Schizophrenia, unspecified: Secondary | ICD-10-CM | POA: Diagnosis not present

## 2022-01-02 DIAGNOSIS — I959 Hypotension, unspecified: Secondary | ICD-10-CM

## 2022-01-02 NOTE — Progress Notes (Unsigned)
[facility] [place], [place] [address]  Internal MEDICINE  Office Visit Note  Patient Name: Dustin [name]  [id number]  [id number]  Date of Service: 01/03/2022  Chief Complaint  Patient presents with   Follow-up    Left hand/arm numbness    HPI Pt is here for ED follow up visit with his brother today. -Patient has schizophrenia and brother helps with providing some information. States behavior appears to be baseline, however is worried about weakness. -He went to the ED on 12/29/21 due to left hand numbness after sleeping on this and it seems to have improved some. It was determined from ED that numbness was likely due to nerve compression while sleeping and his labs were stable. Psych was consulted as well and determined to be at baseline -Brother continues to be concerned today as patient cannot raise left arm at wrist and still shows some overall weakness in left arm. Patient states he can feel sensation in left arm, but is not able to extend wrist. He is able to grip walker and per brother his gait seems to be the same with the walker. No slurred speech and patient able to smile symmetrically in office. Given the level of weakness in arm though will go ahead and place referral to neurology for further evaluation. -Discussed if any acute worsening or new symptoms arise including further weakness, slurred speech, or facial drooping to go back to ED.  Current Medication: Outpatient Encounter Medications as of 01/02/2022  Medication Sig   docusate sodium (COLACE) 100 MG capsule Take 1 capsule (100 mg total) by mouth daily.   feeding supplement (ENSURE ENLIVE / ENSURE PLUS) LIQD Take 237 mLs by mouth 3 (three) times daily between meals.   ferrous sulfate 325 (65 FE) MG EC tablet Take 1 tablet (325 mg total) by mouth daily.   INVEGA SUSTENNA 234 MG/1.5ML injection Inject into the muscle.   haloperidol decanoate (HALDOL DECANOATE) 100 MG/ML injection Inject 1 mL  (100 mg total) into the muscle once for 1 dose. Next dose due 5/15   No facility-administered encounter medications on file as of 01/02/2022.    Surgical History: Past Surgical History:  Procedure Laterality Date   COLONOSCOPY N/A 10/06/2021   Procedure: COLONOSCOPY;  Surgeon: Toledo, Benay Pike, MD;  Location: [facility] ENDOSCOPY;  Service: Gastroenterology;  Laterality: N/A;   ESOPHAGOGASTRODUODENOSCOPY N/A 10/06/2021   Procedure: ESOPHAGOGASTRODUODENOSCOPY (EGD);  Surgeon: Toledo, Benay Pike, MD;  Location: [facility] ENDOSCOPY;  Service: Gastroenterology;  Laterality: N/A;   PARTIAL COLECTOMY  10/13/2021   Procedure: RIGHT COLECTOMY;  Surgeon: Jules Husbands, MD;  Location: [facility] ORS;  Service: General;;    Medical History: Past Medical History:  Diagnosis Date   Schizophrenia ([place])     Family History: Family History  Problem Relation Age of Onset   Lung cancer Father    Hypertension Brother    Hypertension Brother    Hypertension Brother    Prostate cancer Brother     Social History   Socioeconomic History   Marital status: Single    Spouse name: Not on file   Number of children: Not on file   Years of education: Not on file   Highest education level: Not on file  Occupational History   Not on file  Tobacco Use   Smoking status: Every Day    Packs/day: 0.50    Years: 15.00    Total pack years: 7.50    Types: Cigarettes   Smokeless tobacco: Never  Vaping Use   Vaping Use: Never used  Substance and Sexual Activity   Alcohol use: Not Currently   Drug use: No   Sexual activity: Never    Birth control/protection: None  Other Topics Concern   Not on file  Social History Narrative   Not on file   Social Determinants of Health   Financial Resource Strain: Not on file  Food Insecurity: Not on file  Transportation Needs: Not on file  Physical Activity: Not on file  Stress: Not on file  Social Connections: Not on file  Intimate Partner Violence: Not on file      Review  of Systems  Constitutional:  Negative for chills, fatigue and unexpected weight change.  HENT:  Negative for congestion, rhinorrhea, sneezing and sore throat.   Eyes:  Negative for redness.  Respiratory:  Negative for cough, chest tightness and shortness of breath.   Cardiovascular:  Negative for chest pain and palpitations.  Gastrointestinal:  Negative for abdominal pain, constipation, diarrhea, nausea and vomiting.  Genitourinary:  Negative for dysuria and frequency.  Musculoskeletal:  Negative for arthralgias, back pain, joint swelling and neck pain.  Skin:  Negative for rash.  Neurological:  Positive for weakness. Negative for tremors, facial asymmetry, speech difficulty, numbness and headaches.  Hematological:  Negative for adenopathy. Does not bruise/bleed easily.  Psychiatric/Behavioral:  Negative for behavioral problems (Depression), sleep disturbance and suicidal ideas. The patient is not nervous/anxious.     Vital Signs: BP (!) 86/74   Pulse (!) 117   Temp 98.6 F (37 C)   Resp 16   Ht '5\' 7"'$  (1.702 m)   Wt 128 lb 6.4 oz (58.2 kg)   SpO2 99%   BMI 20.11 kg/m    Physical Exam Vitals and nursing note reviewed.  Constitutional:      General: He is not in acute distress.    Appearance: Normal appearance. He is well-developed and normal weight. He is not diaphoretic.  HENT:     Head: Normocephalic and atraumatic.     Mouth/Throat:     Pharynx: No oropharyngeal exudate.  Eyes:     Extraocular Movements: Extraocular movements intact.     Pupils: Pupils are equal, round, and reactive to light.  Neck:     Thyroid: No thyromegaly.     Vascular: No JVD.     Trachea: No tracheal deviation.  Cardiovascular:     Rate and Rhythm: Normal rate and regular rhythm.     Heart sounds: Normal heart sounds. No murmur heard.    No friction rub. No gallop.  Pulmonary:     Effort: Pulmonary effort is normal. No respiratory distress.     Breath sounds: No wheezing or rales.  Chest:      Chest wall: No tenderness.  Abdominal:     General: Bowel sounds are normal.     Palpations: Abdomen is soft.  Musculoskeletal:     Cervical back: Normal range of motion and neck supple.     Comments: Unable to extend left wrist, no tenderness  Lymphadenopathy:     Cervical: No cervical adenopathy.  Skin:    General: Skin is warm and dry.  Neurological:     Mental Status: He is alert and oriented to person, place, and time.     Cranial Nerves: No cranial nerve deficit.     Sensory: No sensory deficit.     Motor: Weakness present.     Gait: Gait abnormal.     Comments: Abnormal gait  is at baseline per brother, uses walker, however left arm weakness with patient unable to extend left wrist without any known injuries. Numbness appears to have improved. Normal speech function and symmetric smile.  Psychiatric:        Behavior: Behavior normal.        Thought Content: Thought content normal.        Judgment: Judgment normal.     Comments: Pt with schizophrenia, per brother behavior is at baseline        Assessment/Plan: 1. Left arm weakness New weakness to left arm with resolving numbness. Will refer to neurology for further evaluation, however advised to go to ED if any acute worsening or new symptoms arise including speech changes or facial drooping. - Ambulatory referral to Neurology  2. Schizophrenia, unspecified type ([place]) Followed by psych  3. Hypotension, unspecified hypotension type Asymptomatic, advised to drink plenty of fluids  General Counseling: Reise verbalizes understanding of the findings of todays visit and agrees with plan of treatment. I have discussed any further diagnostic evaluation that may be needed or ordered today. We also reviewed his medications today. he has been encouraged to call the office with any questions or concerns that should arise related to todays visit.    Orders Placed This Encounter  Procedures   Ambulatory referral to Neurology     No orders of the defined types were placed in this encounter.   This patient was seen by Drema Dallas, PA-C in collaboration with Dr. Clayborn Bigness as a part of collaborative care agreement.   Total time spent:30 Minutes Time spent includes review of chart, medications, test results, and follow up plan with the patient.      Dr Lavera Guise Internal medicine

## 2022-01-03 ENCOUNTER — Ambulatory Visit: Payer: No Typology Code available for payment source | Admitting: Psychiatry

## 2022-01-11 ENCOUNTER — Telehealth: Payer: Self-pay

## 2022-01-11 NOTE — Telephone Encounter (Signed)
Patient called to cancel 01-12-22 appointment. He said he just doesn't want to come. Notified him if keeps cancelling and no show appointments, he will be discharged. Patient understood-Toni

## 2022-01-12 ENCOUNTER — Ambulatory Visit: Payer: Medicare Other | Admitting: Nurse Practitioner

## 2022-01-18 ENCOUNTER — Encounter: Payer: Self-pay | Admitting: Nurse Practitioner

## 2022-03-01 ENCOUNTER — Ambulatory Visit: Payer: Medicare Other | Admitting: Nurse Practitioner

## 2022-03-06 ENCOUNTER — Ambulatory Visit (INDEPENDENT_AMBULATORY_CARE_PROVIDER_SITE_OTHER): Payer: Medicare Other | Admitting: Nurse Practitioner

## 2022-03-06 ENCOUNTER — Encounter: Payer: Self-pay | Admitting: Nurse Practitioner

## 2022-03-06 VITALS — BP 94/61 | HR 95 | Temp 97.6°F | Resp 16 | Ht 67.0 in | Wt 128.0 lb

## 2022-03-06 DIAGNOSIS — D500 Iron deficiency anemia secondary to blood loss (chronic): Secondary | ICD-10-CM | POA: Diagnosis not present

## 2022-03-06 DIAGNOSIS — F209 Schizophrenia, unspecified: Secondary | ICD-10-CM

## 2022-03-06 DIAGNOSIS — R2689 Other abnormalities of gait and mobility: Secondary | ICD-10-CM | POA: Diagnosis not present

## 2022-03-06 DIAGNOSIS — I959 Hypotension, unspecified: Secondary | ICD-10-CM | POA: Diagnosis not present

## 2022-03-06 NOTE — Progress Notes (Signed)
[facility] [place], [place] [address]  Internal MEDICINE  Office Visit Note  Patient Name: Dustin [name]  [id number]  [id number]  Date of Service: 03/06/2022  Chief Complaint  Patient presents with   Follow-up    3 month follow up.    HPI Dustin [name] presents for an office visit for routine follow up. Weight gain -- has nutritional shakes to drink to help him gain weight. He has not gained any weight but he has not lost any either.  Hypotension -- low BP at prior visit, improved today.  Smoker -- smokers 1 ppd. Denies any SOB, cough or wheezing Schizophrenia -- at baseline, followed by psychiatry, on monthly haldol decanoate inj and invega sustenna inj.     Current Medication: Outpatient Encounter Medications as of 03/06/2022  Medication Sig   docusate sodium (COLACE) 100 MG capsule Take 1 capsule (100 mg total) by mouth daily.   feeding supplement (ENSURE ENLIVE / ENSURE PLUS) LIQD Take 237 mLs by mouth 3 (three) times daily between meals.   ferrous sulfate 325 (65 FE) MG EC tablet Take 1 tablet (325 mg total) by mouth daily.   INVEGA SUSTENNA 234 MG/1.5ML injection Inject into the muscle.   haloperidol decanoate (HALDOL DECANOATE) 100 MG/ML injection Inject 1 mL (100 mg total) into the muscle once for 1 dose. Next dose due 5/15   No facility-administered encounter medications on file as of 03/06/2022.    Surgical History: Past Surgical History:  Procedure Laterality Date   COLONOSCOPY N/A 10/06/2021   Procedure: COLONOSCOPY;  Surgeon: Toledo, Benay Pike, MD;  Location: [facility] ENDOSCOPY;  Service: Gastroenterology;  Laterality: N/A;   ESOPHAGOGASTRODUODENOSCOPY N/A 10/06/2021   Procedure: ESOPHAGOGASTRODUODENOSCOPY (EGD);  Surgeon: Toledo, Benay Pike, MD;  Location: [facility] ENDOSCOPY;  Service: Gastroenterology;  Laterality: N/A;   PARTIAL COLECTOMY  10/13/2021   Procedure: RIGHT COLECTOMY;  Surgeon: Jules Husbands, MD;  Location: [facility] ORS;  Service: General;;     Medical History: Past Medical History:  Diagnosis Date   Schizophrenia ([place])     Family History: Family History  Problem Relation Age of Onset   Lung cancer Father    Hypertension Brother    Hypertension Brother    Hypertension Brother    Prostate cancer Brother     Social History   Socioeconomic History   Marital status: Single    Spouse name: Not on file   Number of children: Not on file   Years of education: Not on file   Highest education level: Not on file  Occupational History   Not on file  Tobacco Use   Smoking status: Every Day    Packs/day: 0.50    Years: 15.00    Total pack years: 7.50    Types: Cigarettes   Smokeless tobacco: Never  Vaping Use   Vaping Use: Never used  Substance and Sexual Activity   Alcohol use: Not Currently   Drug use: No   Sexual activity: Never    Birth control/protection: None  Other Topics Concern   Not on file  Social History Narrative   Not on file   Social Determinants of Health   Financial Resource Strain: Not on file  Food Insecurity: Not on file  Transportation Needs: Not on file  Physical Activity: Not on file  Stress: Not on file  Social Connections: Not on file  Intimate Partner Violence: Not on file      Review of Systems  Constitutional:  Positive for fatigue. Negative  for appetite change, chills and unexpected weight change.  HENT:  Negative for congestion, rhinorrhea, sneezing and sore throat.   Eyes:  Negative for redness.  Respiratory: Negative.  Negative for cough, chest tightness, shortness of breath and wheezing.   Cardiovascular: Negative.  Negative for chest pain and palpitations.  Gastrointestinal:  Negative for abdominal pain, constipation, diarrhea, nausea and vomiting.  Genitourinary:  Negative for dysuria and frequency.  Musculoskeletal:  Positive for gait problem (using cane). Negative for arthralgias, back pain, joint swelling and neck pain.  Skin: Negative.  Negative for rash.   Neurological:  Positive for weakness. Negative for tremors, facial asymmetry, speech difficulty, numbness and headaches.  Hematological:  Negative for adenopathy. Does not bruise/bleed easily.  Psychiatric/Behavioral:  Positive for behavioral problems (Depression) and dysphoric mood. Negative for self-injury, sleep disturbance and suicidal ideas. The patient is nervous/anxious.     Vital Signs: BP 94/61   Pulse 95   Temp 97.6 F (36.4 C)   Resp 16   Ht '5\' 7"'$  (1.702 m)   Wt 128 lb (58.1 kg)   SpO2 99%   BMI 20.05 kg/m    Physical Exam Vitals and nursing note reviewed.  Constitutional:      General: He is not in acute distress.    Appearance: Normal appearance. He is well-developed and normal weight. He is not diaphoretic.  HENT:     Head: Normocephalic and atraumatic.     Mouth/Throat:     Pharynx: No oropharyngeal exudate.  Eyes:     Extraocular Movements: Extraocular movements intact.     Pupils: Pupils are equal, round, and reactive to light.  Neck:     Thyroid: No thyromegaly.     Vascular: No JVD.     Trachea: No tracheal deviation.  Cardiovascular:     Rate and Rhythm: Normal rate and regular rhythm.     Heart sounds: Normal heart sounds. No murmur heard.    No friction rub. No gallop.  Pulmonary:     Effort: Pulmonary effort is normal. No respiratory distress.     Breath sounds: No wheezing or rales.  Chest:     Chest wall: No tenderness.  Abdominal:     General: Bowel sounds are normal.     Palpations: Abdomen is soft.  Musculoskeletal:     Cervical back: Normal range of motion and neck supple.     Comments: Unable to extend left wrist, no tenderness  Lymphadenopathy:     Cervical: No cervical adenopathy.  Skin:    General: Skin is warm and dry.  Neurological:     Mental Status: He is alert. Mental status is at baseline.     Cranial Nerves: No cranial nerve deficit.     Sensory: No sensory deficit.     Motor: Weakness present.     Gait: Gait  abnormal.     Comments: Abnormal gait is at baseline per brother, uses cane, Normal speech function and symmetric smile.  Psychiatric:        Behavior: Behavior normal.        Thought Content: Thought content normal.        Judgment: Judgment normal.     Comments: Pt with schizophrenia, per brother behavior is at baseline        Assessment/Plan: 1. Hypotension, unspecified hypotension type BP improved from prior office visit. Encouraged hydration and drinking his nutritional shakes.   2. Iron deficiency anemia due to chronic blood loss Followed by Dr. Tasia Catchings, hematology. Taking iron  supplements still.   3. Impairment of balance Improved from prior visit, using cane now instead of walker, home health PT still coming to his home per patient  4. Schizophrenia, unspecified type ([place]) At baseline, followed by psych, no change in maintenance meds   General Counseling: Dustin [name] understanding of the findings of todays visit and agrees with plan of treatment. I have discussed any further diagnostic evaluation that may be needed or ordered today. We also reviewed his medications today. he has been encouraged to call the office with any questions or concerns that should arise related to todays visit.    No orders of the defined types were placed in this encounter.   No orders of the defined types were placed in this encounter.   Return in about 3 months (around 06/06/2022) for F/U, Leanna Hamid PCP.   Total time spent:30 Minutes Time spent includes review of chart, medications, test results, and follow up plan with the patient.   [place] Controlled Substance Database was reviewed by me.  This patient was seen by Jonetta Osgood, FNP-C in collaboration with Dr. Clayborn Bigness as a part of collaborative care agreement.   Xaine Sansom R. Valetta Fuller, MSN, FNP-C Internal medicine

## 2022-03-14 ENCOUNTER — Ambulatory Visit: Payer: Medicare Other | Admitting: Neurology

## 2022-03-21 ENCOUNTER — Other Ambulatory Visit: Payer: Medicare Other | Admitting: Nurse Practitioner

## 2022-05-08 ENCOUNTER — Other Ambulatory Visit: Payer: Self-pay | Admitting: Oncology

## 2022-05-09 ENCOUNTER — Inpatient Hospital Stay: Payer: Medicare Other | Attending: Oncology

## 2022-05-09 DIAGNOSIS — Z801 Family history of malignant neoplasm of trachea, bronchus and lung: Secondary | ICD-10-CM | POA: Insufficient documentation

## 2022-05-09 DIAGNOSIS — D509 Iron deficiency anemia, unspecified: Secondary | ICD-10-CM | POA: Insufficient documentation

## 2022-05-09 DIAGNOSIS — C182 Malignant neoplasm of ascending colon: Secondary | ICD-10-CM | POA: Insufficient documentation

## 2022-05-09 DIAGNOSIS — Z800 Family history of malignant neoplasm of digestive organs: Secondary | ICD-10-CM | POA: Insufficient documentation

## 2022-05-09 DIAGNOSIS — F17210 Nicotine dependence, cigarettes, uncomplicated: Secondary | ICD-10-CM | POA: Insufficient documentation

## 2022-05-11 ENCOUNTER — Inpatient Hospital Stay: Payer: Medicare Other

## 2022-05-11 ENCOUNTER — Encounter: Payer: Self-pay | Admitting: Oncology

## 2022-05-11 ENCOUNTER — Other Ambulatory Visit: Payer: Self-pay

## 2022-05-11 ENCOUNTER — Inpatient Hospital Stay (HOSPITAL_BASED_OUTPATIENT_CLINIC_OR_DEPARTMENT_OTHER): Payer: Medicare Other | Admitting: Oncology

## 2022-05-11 VITALS — BP 118/82 | HR 84 | Temp 97.9°F | Resp 16

## 2022-05-11 VITALS — BP 133/101 | HR 83 | Temp 96.5°F | Resp 18 | Wt 150.3 lb

## 2022-05-11 DIAGNOSIS — F17210 Nicotine dependence, cigarettes, uncomplicated: Secondary | ICD-10-CM | POA: Diagnosis not present

## 2022-05-11 DIAGNOSIS — Z801 Family history of malignant neoplasm of trachea, bronchus and lung: Secondary | ICD-10-CM | POA: Diagnosis not present

## 2022-05-11 DIAGNOSIS — Z800 Family history of malignant neoplasm of digestive organs: Secondary | ICD-10-CM | POA: Diagnosis not present

## 2022-05-11 DIAGNOSIS — D509 Iron deficiency anemia, unspecified: Secondary | ICD-10-CM | POA: Diagnosis not present

## 2022-05-11 DIAGNOSIS — D500 Iron deficiency anemia secondary to blood loss (chronic): Secondary | ICD-10-CM | POA: Diagnosis not present

## 2022-05-11 DIAGNOSIS — C182 Malignant neoplasm of ascending colon: Secondary | ICD-10-CM

## 2022-05-11 DIAGNOSIS — Z809 Family history of malignant neoplasm, unspecified: Secondary | ICD-10-CM | POA: Diagnosis not present

## 2022-05-11 LAB — CBC WITH DIFFERENTIAL/PLATELET
Abs Immature Granulocytes: 0.02 10*3/uL (ref 0.00–0.07)
Basophils Absolute: 0 10*3/uL (ref 0.0–0.1)
Basophils Relative: 1 %
Eosinophils Absolute: 0.1 10*3/uL (ref 0.0–0.5)
Eosinophils Relative: 1 %
HCT: 27 % — ABNORMAL LOW (ref 39.0–52.0)
Hemoglobin: 8 g/dL — ABNORMAL LOW (ref 13.0–17.0)
Immature Granulocytes: 0 %
Lymphocytes Relative: 17 %
Lymphs Abs: 1.2 10*3/uL (ref 0.7–4.0)
MCH: 21.6 pg — ABNORMAL LOW (ref 26.0–34.0)
MCHC: 29.6 g/dL — ABNORMAL LOW (ref 30.0–36.0)
MCV: 72.8 fL — ABNORMAL LOW (ref 80.0–100.0)
Monocytes Absolute: 0.7 10*3/uL (ref 0.1–1.0)
Monocytes Relative: 9 %
Neutro Abs: 5.3 10*3/uL (ref 1.7–7.7)
Neutrophils Relative %: 72 %
Platelets: 277 10*3/uL (ref 150–400)
RBC: 3.71 MIL/uL — ABNORMAL LOW (ref 4.22–5.81)
RDW: 16.2 % — ABNORMAL HIGH (ref 11.5–15.5)
WBC: 7.3 10*3/uL (ref 4.0–10.5)
nRBC: 0 % (ref 0.0–0.2)

## 2022-05-11 LAB — COMPREHENSIVE METABOLIC PANEL
ALT: 11 U/L (ref 0–44)
AST: 16 U/L (ref 15–41)
Albumin: 3.8 g/dL (ref 3.5–5.0)
Alkaline Phosphatase: 82 U/L (ref 38–126)
Anion gap: 7 (ref 5–15)
BUN: 14 mg/dL (ref 8–23)
CO2: 22 mmol/L (ref 22–32)
Calcium: 9 mg/dL (ref 8.9–10.3)
Chloride: 108 mmol/L (ref 98–111)
Creatinine, Ser: 0.87 mg/dL (ref 0.61–1.24)
GFR, Estimated: >60 mL/min (ref >60)
Glucose, Bld: 98 mg/dL (ref 70–99)
Potassium: 3.8 mmol/L (ref 3.5–5.1)
Sodium: 137 mmol/L (ref 135–145)
Total Bilirubin: 0.2 mg/dL — ABNORMAL LOW (ref 0.3–1.2)
Total Protein: 7.4 g/dL (ref 6.5–8.1)

## 2022-05-11 LAB — IRON AND TIBC
Iron: 17 ug/dL — ABNORMAL LOW (ref 45–182)
Saturation Ratios: 4 % — ABNORMAL LOW (ref 17.9–39.5)
TIBC: 486 ug/dL — ABNORMAL HIGH (ref 250–450)
UIBC: 469 ug/dL

## 2022-05-11 LAB — FERRITIN: Ferritin: 4 ng/mL — ABNORMAL LOW (ref 24–336)

## 2022-05-11 MED ORDER — SODIUM CHLORIDE 0.9 % IV SOLN
Freq: Once | INTRAVENOUS | Status: AC
  Filled 2022-05-11: qty 250

## 2022-05-11 MED ORDER — SODIUM CHLORIDE 0.9 % IV SOLN
510.0000 mg | Freq: Once | INTRAVENOUS | Status: AC
  Administered 2022-05-11: 510 mg via INTRAVENOUS
  Filled 2022-05-11: qty 510

## 2022-05-11 NOTE — Progress Notes (Signed)
Pt here for follow up. Pt would like a copy of his operative report

## 2022-05-11 NOTE — Assessment & Plan Note (Addendum)
Hemoglobin has dropped. Iron panel is consistent with iron deficiency.  Recommend IV Feraheme weekly x 2 Need GI work up

## 2022-05-11 NOTE — Progress Notes (Signed)
Hematology/Oncology Progress note [phone] [phone]     Patient Care Team: Jonetta Osgood, NP as PCP - General (Nurse Practitioner) Earlie Server, MD as Consulting Physician (Oncology)   ASSESSMENT & PLAN:   Malignant neoplasm of ascending colon [facility]) #Stage I right colon cancer, pT1b pN0 M0 S/p right hemicolectomy. No adjuvant chemo needed.  Continue surveillance.  With recurrent iron deficiency anemia, recommend GI work up.   IDA (iron deficiency anemia) Hemoglobin has dropped. Iron panel is consistent with iron deficiency.  Recommend IV Venofer weekly x 5 Need GI work up  Family history of cancer #Family history of colon cancer, personal history of colon cancer as well as tubular adenomas. Recommend genetic testing.  Previously referred to genetic counselor, did not establish care  Orders Placed This Encounter  Procedures   CBC with Differential/Platelet    Standing Status:   Future    Standing Expiration Date:   05/12/2023   Iron and TIBC    Standing Status:   Future    Standing Expiration Date:   05/12/2023   Ferritin    Standing Status:   Future    Standing Expiration Date:   05/12/2023   Follow up in 6 weeks.  All questions were answered. The patient knows to call the clinic with any problems, questions or concerns.  Earlie Server, MD, PhD [facility] Hematology Oncology 05/11/2022    REASON FOR VISIT For stage I colon cancer, iron deficiency anemia  PERTINENT ONCOLOGY HISTORY  10/13/2021, patient underwent right colectomy. Pathology showed invasive colorectal adenocarcinoma arising 8.2 cm tubulovillous adenoma, 2 incidental tubular adenomas, hernia sac excision showed mesothelial lined fibroadipose tissue with congestion and reactive changes consistent with hernia sac.  Negative for malignancy.  pT1pN0.  39 lymph nodes were examined and were negative.  Patient received IV Feraheme during his hospitalization for iron deficiency anemia.   She was also on oral iron supplementation 3 times a day which he did not continue after discharge.  Iron tablets cause constipation Today he was accompanied by one of his brothers, [name] 2020  INTERVAL HISTORY Patient is a poor historian.  He feels weaker.  Denies any abdominal pain, blood in stool. He has gained weight.    No Known Allergies   Past Medical History:  Diagnosis Date   Schizophrenia [facility])      Past Surgical History:  Procedure Laterality Date   COLONOSCOPY N/A 10/06/2021   Procedure: COLONOSCOPY;  Surgeon: Toledo, Benay Pike, MD;  Location: [facility] ENDOSCOPY;  Service: Gastroenterology;  Laterality: N/A;   ESOPHAGOGASTRODUODENOSCOPY N/A 10/06/2021   Procedure: ESOPHAGOGASTRODUODENOSCOPY (EGD);  Surgeon: Toledo, Benay Pike, MD;  Location: [facility] ENDOSCOPY;  Service: Gastroenterology;  Laterality: N/A;   PARTIAL COLECTOMY  10/13/2021   Procedure: RIGHT COLECTOMY;  Surgeon: Jules Husbands, MD;  Location: [facility] ORS;  Service: General;;    Social History   Socioeconomic History   Marital status: Single    Spouse name: Not on file   Number of children: Not on file   Years of education: Not on file   Highest education level: Not on file  Occupational History   Not on file  Tobacco Use   Smoking status: Every Day    Packs/day: 0.50    Years: 15.00    Total pack years: 7.50    Types: Cigarettes   Smokeless tobacco: Never  Vaping Use   Vaping Use: Never used  Substance and Sexual Activity   Alcohol use: Not Currently  Drug use: No   Sexual activity: Never    Birth control/protection: None  Other Topics Concern   Not on file  Social History Narrative   Not on file   Social Determinants of Health   Financial Resource Strain: Not on file  Food Insecurity: Not on file  Transportation Needs: Not on file  Physical Activity: Not on file  Stress: Not on file  Social Connections: Not on file  Intimate Partner Violence: Not on file    Family History   Problem Relation Age of Onset   Lung cancer Father    Hypertension Brother    Hypertension Brother    Hypertension Brother    Prostate cancer Brother      Current Outpatient Medications:    docusate sodium (COLACE) 100 MG capsule, Take 1 capsule (100 mg total) by mouth daily., Disp: 90 capsule, Rfl: 1   feeding supplement (ENSURE ENLIVE / ENSURE PLUS) LIQD, Take 237 mLs by mouth 3 (three) times daily between meals., Disp: 237 mL, Rfl: 12   ferrous sulfate 325 (65 FE) MG EC tablet, Take 1 tablet (325 mg total) by mouth daily., Disp: 90 tablet, Rfl: 1   haloperidol decanoate (HALDOL DECANOATE) 100 MG/ML injection, Inject 1 mL (100 mg total) into the muscle once for 1 dose. Next dose due 5/15, Disp: 1 mL, Rfl: 1   INVEGA SUSTENNA 234 MG/1.5ML injection, Inject into the muscle., Disp: , Rfl:   Review of Systems  Constitutional:  Positive for fatigue. Negative for appetite change, chills, fever and unexpected weight change.  HENT:   Negative for hearing loss and voice change.   Eyes:  Negative for eye problems and icterus.  Respiratory:  Negative for chest tightness, cough and shortness of breath.   Cardiovascular:  Negative for chest pain and leg swelling.  Gastrointestinal:  Negative for abdominal distention and abdominal pain.  Endocrine: Negative for hot flashes.  Genitourinary:  Negative for difficulty urinating, dysuria and frequency.   Musculoskeletal:  Negative for arthralgias.  Skin:  Negative for itching and rash.  Neurological:  Negative for light-headedness and numbness.  Hematological:  Negative for adenopathy. Does not bruise/bleed easily.  Psychiatric/Behavioral:  Negative for confusion.     Physical exam:  Vitals:   05/11/22 1517  BP: (!) 133/101  Pulse: 83  Resp: 18  Temp: (!) 96.5 F (35.8 C)  Weight: 150 lb 4.8 oz (68.2 kg)   Physical Exam Constitutional:      General: He is not in acute distress. HENT:     Head: Normocephalic and atraumatic.  Eyes:      General: No scleral icterus. Cardiovascular:     Rate and Rhythm: Normal rate.  Pulmonary:     Effort: Pulmonary effort is normal. No respiratory distress.     Breath sounds: No wheezing.  Abdominal:     General: Bowel sounds are normal. There is no distension.     Palpations: Abdomen is soft.  Musculoskeletal:        General: No deformity. Normal range of motion.     Cervical back: Normal range of motion and neck supple.  Skin:    General: Skin is warm and dry.     Findings: No erythema or rash.  Neurological:     Mental Status: He is alert. Mental status is at baseline.     Cranial Nerves: No cranial nerve deficit.     Comments: Slow speech  Psychiatric:     Comments: non coherent speech  Labs    Latest Ref Rng & Units 05/11/2022    2:41 PM 12/29/2021    8:22 PM 11/08/2021    2:46 PM  CBC  WBC 4.0 - 10.5 K/uL 7.3  7.2  6.7   Hemoglobin 13.0 - 17.0 g/dL 8.0  11.4  11.1   Hematocrit 39.0 - 52.0 % 27.0  37.3  37.8   Platelets 150 - 400 K/uL 277  237  184       Latest Ref Rng & Units 05/11/2022    2:41 PM 12/29/2021    8:22 PM 10/15/2021    5:23 AM  CMP  Glucose 70 - 99 mg/dL 98  93  100   BUN 8 - 23 mg/dL '14  14  18   '$ Creatinine 0.61 - 1.24 mg/dL 0.87  0.97  1.06   Sodium 135 - 145 mmol/L 137  138  137   Potassium 3.5 - 5.1 mmol/L 3.8  4.1  4.5   Chloride 98 - 111 mmol/L 108  109  109   CO2 22 - 32 mmol/L '22  22  26   '$ Calcium 8.9 - 10.3 mg/dL 9.0  9.3  9.0   Total Protein 6.5 - 8.1 g/dL 7.4     Total Bilirubin 0.3 - 1.2 mg/dL 0.2     Alkaline Phos 38 - 126 U/L 82     AST 15 - 41 U/L 16     ALT 0 - 44 U/L 11      Lab Results  Component Value Date   IRON 17 (L) 05/11/2022   TIBC 486 (H) 05/11/2022   FERRITIN 4 (L) 05/11/2022      RADIOGRAPHIC STUDIES: I have personally reviewed the radiological images as listed and agreed with the findings in the report. No results found.

## 2022-05-11 NOTE — Assessment & Plan Note (Addendum)
#  Stage I right colon cancer, pT1b pN0 M0 S/p right hemicolectomy. No adjuvant chemo needed.  Continue surveillance.  With recurrent iron deficiency anemia, recommend GI work up.

## 2022-05-11 NOTE — Assessment & Plan Note (Addendum)
#  Family history of colon cancer, personal history of colon cancer as well as tubular adenomas. Recommend genetic testing.  Previously referred to genetic counselor, did not establish care

## 2022-05-12 LAB — CEA: CEA: 8.4 ng/mL — ABNORMAL HIGH (ref 0.0–4.7)

## 2022-05-15 MED FILL — Ferumoxytol Inj 510 MG/17ML (30 MG/ML) (Elemental Fe): INTRAVENOUS | Qty: 17 | Status: AC

## 2022-05-16 ENCOUNTER — Inpatient Hospital Stay: Payer: Medicare Other

## 2022-05-22 DIAGNOSIS — D500 Iron deficiency anemia secondary to blood loss (chronic): Secondary | ICD-10-CM | POA: Diagnosis not present

## 2022-05-22 DIAGNOSIS — Z85038 Personal history of other malignant neoplasm of large intestine: Secondary | ICD-10-CM | POA: Diagnosis not present

## 2022-05-24 ENCOUNTER — Other Ambulatory Visit: Payer: Self-pay | Admitting: Oncology

## 2022-05-24 ENCOUNTER — Inpatient Hospital Stay: Payer: Medicare Other

## 2022-05-24 VITALS — BP 119/75 | HR 83 | Temp 97.1°F | Resp 16

## 2022-05-24 DIAGNOSIS — D509 Iron deficiency anemia, unspecified: Secondary | ICD-10-CM | POA: Diagnosis not present

## 2022-05-24 DIAGNOSIS — D500 Iron deficiency anemia secondary to blood loss (chronic): Secondary | ICD-10-CM

## 2022-05-24 DIAGNOSIS — F17210 Nicotine dependence, cigarettes, uncomplicated: Secondary | ICD-10-CM | POA: Diagnosis not present

## 2022-05-24 DIAGNOSIS — Z800 Family history of malignant neoplasm of digestive organs: Secondary | ICD-10-CM | POA: Diagnosis not present

## 2022-05-24 DIAGNOSIS — Z801 Family history of malignant neoplasm of trachea, bronchus and lung: Secondary | ICD-10-CM | POA: Diagnosis not present

## 2022-05-24 DIAGNOSIS — C182 Malignant neoplasm of ascending colon: Secondary | ICD-10-CM | POA: Diagnosis not present

## 2022-05-24 MED ORDER — SODIUM CHLORIDE 0.9 % IV SOLN
Freq: Once | INTRAVENOUS | Status: AC
  Filled 2022-05-24: qty 250

## 2022-05-24 MED ORDER — SODIUM CHLORIDE 0.9 % IV SOLN
510.0000 mg | Freq: Once | INTRAVENOUS | Status: AC
  Administered 2022-05-24: 510 mg via INTRAVENOUS
  Filled 2022-05-24: qty 510

## 2022-06-08 ENCOUNTER — Encounter: Payer: Self-pay | Admitting: Nurse Practitioner

## 2022-06-08 ENCOUNTER — Ambulatory Visit (INDEPENDENT_AMBULATORY_CARE_PROVIDER_SITE_OTHER): Payer: 59 | Admitting: Nurse Practitioner

## 2022-06-08 VITALS — BP 108/67 | HR 60 | Temp 97.1°F | Resp 16 | Ht 67.0 in | Wt 145.6 lb

## 2022-06-08 DIAGNOSIS — I959 Hypotension, unspecified: Secondary | ICD-10-CM | POA: Diagnosis not present

## 2022-06-08 DIAGNOSIS — R2689 Other abnormalities of gait and mobility: Secondary | ICD-10-CM

## 2022-06-08 DIAGNOSIS — D500 Iron deficiency anemia secondary to blood loss (chronic): Secondary | ICD-10-CM | POA: Diagnosis not present

## 2022-06-08 DIAGNOSIS — F209 Schizophrenia, unspecified: Secondary | ICD-10-CM | POA: Diagnosis not present

## 2022-06-08 NOTE — Progress Notes (Unsigned)
[facility] [place], [place] [address]  Internal MEDICINE  Office Visit Note  Patient Name: Dustin [name]  [id number]  [id number]  Date of Service: 06/08/2022  Chief Complaint  Patient presents with   Follow-up    HPI Adreyan presents for a follow-up visit for routine follow up related to general well-being, weight gain, and nutrition.  Gained weight -- weight is 145 lbs today which is up from 128 lbs in October.  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     Current Medication: Outpatient Encounter Medications as of 06/08/2022  Medication Sig   docusate sodium (COLACE) 100 MG capsule Take 1 capsule (100 mg total) by mouth daily.   feeding supplement (ENSURE ENLIVE / ENSURE PLUS) LIQD Take 237 mLs by mouth 3 (three) times daily between meals.   ferrous sulfate 325 (65 FE) MG EC tablet Take 1 tablet (325 mg total) by mouth daily.   INVEGA SUSTENNA 234 MG/1.5ML injection Inject into the muscle.   haloperidol decanoate (HALDOL DECANOATE) 100 MG/ML injection Inject 1 mL (100 mg total) into the muscle once for 1 dose. Next dose due 5/15 (Patient not taking: Reported on 05/11/2022)   No facility-administered encounter medications on file as of 06/08/2022.    Surgical History: Past Surgical History:  Procedure Laterality Date   COLONOSCOPY N/A 10/06/2021   Procedure: COLONOSCOPY;  Surgeon: Toledo, Benay Pike, MD;  Location: [facility] ENDOSCOPY;  Service: Gastroenterology;  Laterality: N/A;   ESOPHAGOGASTRODUODENOSCOPY N/A 10/06/2021   Procedure: ESOPHAGOGASTRODUODENOSCOPY (EGD);  Surgeon: Toledo, Benay Pike, MD;  Location: [facility] ENDOSCOPY;  Service: Gastroenterology;  Laterality: N/A;   PARTIAL COLECTOMY  10/13/2021   Procedure: RIGHT COLECTOMY;  Surgeon: Jules Husbands, MD;  Location: [facility] ORS;  Service: General;;    Medical History: Past Medical History:  Diagnosis Date   Schizophrenia ([place])     Family History: Family History  Problem Relation Age of Onset   Lung cancer Father    Hypertension Brother    Hypertension Brother    Hypertension Brother    Prostate cancer Brother     Social History   Socioeconomic History   Marital status: Single    Spouse name: Not on file   Number of children: Not on file   Years of education: Not on file   Highest education level: Not on file  Occupational History   Not on file  Tobacco Use   Smoking status: Every Day    Packs/day: 0.50    Years: 15.00    Total pack years: 7.50    Types: Cigarettes   Smokeless tobacco: Never  Vaping Use   Vaping Use: Never used  Substance and Sexual Activity   Alcohol use: Not Currently   Drug use: No   Sexual activity: Never    Birth control/protection: None  Other Topics Concern   Not on file  Social History Narrative   Not on file   Social Determinants of Health   Financial Resource Strain: Not on file  Food Insecurity: Not on file  Transportation Needs: Not on file  Physical Activity: Not on file  Stress: Not on file  Social Connections: Not on file  Intimate Partner Violence: Not on file      Review of Systems  Constitutional:  Positive for  fatigue. Negative for appetite change, chills and unexpected weight change.  HENT:  Negative for congestion, rhinorrhea, sneezing and sore throat.   Eyes:  Negative for redness.  Respiratory: Negative.  Negative for cough, chest tightness, shortness of breath and wheezing.   Cardiovascular: Negative.  Negative for chest pain and palpitations.  Gastrointestinal:  Negative for abdominal pain, constipation, diarrhea, nausea and vomiting.  Genitourinary:  Negative for dysuria and frequency.  Musculoskeletal:  Positive for gait problem (using cane). Negative for arthralgias, back pain, joint swelling and  neck pain.  Skin: Negative.  Negative for rash.  Neurological:  Positive for weakness. Negative for tremors, facial asymmetry, speech difficulty, numbness and headaches.  Hematological:  Negative for adenopathy. Does not bruise/bleed easily.  Psychiatric/Behavioral:  Positive for behavioral problems (Depression). Negative for dysphoric mood, self-injury, sleep disturbance and suicidal ideas. The patient is nervous/anxious.     Vital Signs: BP 108/67   Pulse 60 Comment: 52  Temp (!) 97.1 F (36.2 C)   Resp 16   Ht '5\' 7"'$  (1.702 m)   Wt 145 lb 9.6 oz (66 kg)   SpO2 99%   BMI 22.80 kg/m    Physical Exam Vitals and nursing note reviewed.  Constitutional:      General: He is not in acute distress.    Appearance: Normal appearance. He is well-developed and normal weight. He is not diaphoretic.  HENT:     Head: Normocephalic and atraumatic.     Mouth/Throat:     Pharynx: No oropharyngeal exudate.  Eyes:     Extraocular Movements: Extraocular movements intact.     Pupils: Pupils are equal, round, and reactive to light.  Neck:     Thyroid: No thyromegaly.     Vascular: No JVD.     Trachea: No tracheal deviation.  Cardiovascular:     Rate and Rhythm: Normal rate and regular rhythm.     Heart sounds: Normal heart sounds. No murmur heard.    No friction rub. No gallop.  Pulmonary:     Effort: Pulmonary effort is normal. No respiratory distress.     Breath sounds: No wheezing or rales.  Chest:     Chest wall: No tenderness.  Abdominal:     General: Bowel sounds are normal.     Palpations: Abdomen is soft.  Musculoskeletal:     Cervical back: Normal range of motion and neck supple.     Comments: Unable to extend left wrist, no tenderness  Lymphadenopathy:     Cervical: No cervical adenopathy.  Skin:    General: Skin is warm and dry.  Neurological:     Mental Status: He is alert. Mental status is at baseline.     Cranial Nerves: No cranial nerve deficit.     Sensory: No  sensory deficit.     Motor: Weakness present.     Gait: Gait abnormal.     Comments: Abnormal gait is at baseline per brother, uses cane, Normal speech function and symmetric smile.  Psychiatric:        Behavior: Behavior normal.        Thought Content: Thought content normal.        Judgment: Judgment normal.     Comments: Pt with schizophrenia, per brother behavior is at baseline        Assessment/Plan: 1. Hypotension, unspecified hypotension type Improved now that he has gained weight and is eating more food. BP is normal today.   2. Impairment of balance Much improved when compared  to his initial discharge from the hospital last year but is still dependent on a 4-pronged cane.   3. Iron deficiency anemia due to chronic blood loss Anemia s/p surgical removal of colonic mass. He is still being followed by heme/onc and has another iron infusion scheduled for 06/30/21  4. Schizophrenia, unspecified type ([place]) Monitored by [facility], is at baseline, takes a monthly deconate injection of haldol and invega sustenna.   General Counseling: trayson stitely understanding of the findings of todays visit and agrees with plan of treatment. I have discussed any further diagnostic evaluation that may be needed or ordered today. We also reviewed his medications today. he has been encouraged to call the office with any questions or concerns that should arise related to todays visit.    No orders of the defined types were placed in this encounter.   No orders of the defined types were placed in this encounter.   Return in about 3 months (around 09/07/2022) for F/U, Sheyna Pettibone PCP.   Total time spent:30 Minutes Time spent includes review of chart, medications, test results, and follow up plan with the patient.   [place] Controlled Substance Database was reviewed by me.  This patient was seen by Jonetta Osgood, FNP-C in collaboration with Dr. Clayborn Bigness as a part of collaborative care  agreement.   Sulaiman Imbert R. Valetta Fuller, MSN, FNP-C Internal medicine

## 2022-06-10 ENCOUNTER — Encounter: Payer: Self-pay | Admitting: Nurse Practitioner

## 2022-06-23 ENCOUNTER — Encounter: Payer: Self-pay | Admitting: Oncology

## 2022-06-30 ENCOUNTER — Inpatient Hospital Stay: Payer: 59 | Attending: Oncology

## 2022-06-30 DIAGNOSIS — C182 Malignant neoplasm of ascending colon: Secondary | ICD-10-CM | POA: Insufficient documentation

## 2022-06-30 DIAGNOSIS — Z800 Family history of malignant neoplasm of digestive organs: Secondary | ICD-10-CM | POA: Insufficient documentation

## 2022-06-30 DIAGNOSIS — D509 Iron deficiency anemia, unspecified: Secondary | ICD-10-CM | POA: Insufficient documentation

## 2022-06-30 DIAGNOSIS — F17210 Nicotine dependence, cigarettes, uncomplicated: Secondary | ICD-10-CM | POA: Insufficient documentation

## 2022-06-30 DIAGNOSIS — Z801 Family history of malignant neoplasm of trachea, bronchus and lung: Secondary | ICD-10-CM | POA: Insufficient documentation

## 2022-07-03 MED FILL — Ferumoxytol Inj 510 MG/17ML (30 MG/ML) (Elemental Fe): INTRAVENOUS | Qty: 17 | Status: AC

## 2022-07-04 ENCOUNTER — Inpatient Hospital Stay (HOSPITAL_BASED_OUTPATIENT_CLINIC_OR_DEPARTMENT_OTHER): Payer: 59 | Admitting: Oncology

## 2022-07-04 ENCOUNTER — Encounter: Payer: Self-pay | Admitting: Oncology

## 2022-07-04 ENCOUNTER — Inpatient Hospital Stay: Payer: 59

## 2022-07-04 VITALS — BP 99/78 | HR 58 | Temp 96.9°F | Resp 18 | Wt 138.3 lb

## 2022-07-04 DIAGNOSIS — D500 Iron deficiency anemia secondary to blood loss (chronic): Secondary | ICD-10-CM | POA: Diagnosis not present

## 2022-07-04 DIAGNOSIS — F17210 Nicotine dependence, cigarettes, uncomplicated: Secondary | ICD-10-CM | POA: Diagnosis not present

## 2022-07-04 DIAGNOSIS — Z801 Family history of malignant neoplasm of trachea, bronchus and lung: Secondary | ICD-10-CM | POA: Diagnosis not present

## 2022-07-04 DIAGNOSIS — Z809 Family history of malignant neoplasm, unspecified: Secondary | ICD-10-CM | POA: Diagnosis not present

## 2022-07-04 DIAGNOSIS — D509 Iron deficiency anemia, unspecified: Secondary | ICD-10-CM | POA: Diagnosis not present

## 2022-07-04 DIAGNOSIS — Z800 Family history of malignant neoplasm of digestive organs: Secondary | ICD-10-CM | POA: Diagnosis not present

## 2022-07-04 DIAGNOSIS — C182 Malignant neoplasm of ascending colon: Secondary | ICD-10-CM | POA: Diagnosis not present

## 2022-07-04 LAB — CBC WITH DIFFERENTIAL/PLATELET
Abs Immature Granulocytes: 0.01 10*3/uL (ref 0.00–0.07)
Basophils Absolute: 0 10*3/uL (ref 0.0–0.1)
Basophils Relative: 0 %
Eosinophils Absolute: 0.1 10*3/uL (ref 0.0–0.5)
Eosinophils Relative: 2 %
HCT: 32.9 % — ABNORMAL LOW (ref 39.0–52.0)
Hemoglobin: 9.8 g/dL — ABNORMAL LOW (ref 13.0–17.0)
Immature Granulocytes: 0 %
Lymphocytes Relative: 11 %
Lymphs Abs: 0.6 10*3/uL — ABNORMAL LOW (ref 0.7–4.0)
MCH: 23.8 pg — ABNORMAL LOW (ref 26.0–34.0)
MCHC: 29.8 g/dL — ABNORMAL LOW (ref 30.0–36.0)
MCV: 79.9 fL — ABNORMAL LOW (ref 80.0–100.0)
Monocytes Absolute: 0.4 10*3/uL (ref 0.1–1.0)
Monocytes Relative: 8 %
Neutro Abs: 4.3 10*3/uL (ref 1.7–7.7)
Neutrophils Relative %: 79 %
Platelets: 177 10*3/uL (ref 150–400)
RBC: 4.12 MIL/uL — ABNORMAL LOW (ref 4.22–5.81)
RDW: 21.2 % — ABNORMAL HIGH (ref 11.5–15.5)
WBC: 5.5 10*3/uL (ref 4.0–10.5)
nRBC: 0 % (ref 0.0–0.2)

## 2022-07-04 LAB — IRON AND TIBC
Iron: 37 ug/dL — ABNORMAL LOW (ref 45–182)
Saturation Ratios: 11 % — ABNORMAL LOW (ref 17.9–39.5)
TIBC: 344 ug/dL (ref 250–450)
UIBC: 307 ug/dL

## 2022-07-04 LAB — FERRITIN: Ferritin: 11 ng/mL — ABNORMAL LOW (ref 24–336)

## 2022-07-04 NOTE — Progress Notes (Signed)
Hematology/Oncology Progress note [phone] [phone]     Patient Care Team: Jonetta Osgood, NP as PCP - General (Nurse Practitioner) Earlie Server, MD as Consulting Physician (Oncology)   ASSESSMENT & PLAN:   Cancer Staging  Malignant neoplasm of ascending colon [facility]) Staging form: Colon and Rectum - Neuroendocine Tumors, AJCC 8th Edition - Pathologic stage from 11/08/2021: Stage I (pT1, pN0, cM0) - Signed by Earlie Server, MD on 11/08/2021   Malignant neoplasm of ascending colon ([place]) #Stage I right colon cancer, pT1b pN0 M0 S/p right hemicolectomy. No adjuvant chemo needed.  Continue surveillance.  With recurrent iron deficiency anemia, recommend GI work up patient adamantly declined.  I also discussed about repeat CT scan and he is not interested.   IDA (iron deficiency anemia) Hemoglobin has dropped. Iron panel is consistent with iron deficiency.  Recommend IV Feraheme weekly x 2 Need GI work up, however patient adamantly declined. He is only in agreement in getting IV Iron.  Family history of cancer #Family history of colon cancer, personal history of colon cancer as well as tubular adenomas. Recommend genetic testing.  Previously referred to genetic counselor, did not establish care  No orders of the defined types were placed in this encounter.  Follow up in 12 weeks.  All questions were answered. The patient knows to call the clinic with any problems, questions or concerns.  Earlie Server, MD, PhD [facility] Hematology Oncology 07/04/2022    REASON FOR VISIT For stage I colon cancer, iron deficiency anemia  PERTINENT ONCOLOGY HISTORY  10/13/2021, patient underwent right colectomy. Pathology showed invasive colorectal adenocarcinoma arising 8.2 cm tubulovillous adenoma, 2 incidental tubular adenomas, hernia sac excision showed mesothelial lined fibroadipose tissue with congestion and reactive changes consistent with hernia sac.  Negative for  malignancy.  pT1pN0.  39 lymph nodes were examined and were negative.  Patient received IV Feraheme during his hospitalization for iron deficiency anemia.  She was also on oral iron supplementation 3 times a day which he did not continue after discharge.  Iron tablets cause constipation Today he was accompanied by one of his brothers, Mr. Indy Kuck 2020  INTERVAL HISTORY Patient is a poor historian. Accompanied by brother He feels "fine". S/p IV Feraheme  Denies any abdominal pain, blood in stool. He has lost weight.    No Known Allergies   Past Medical History:  Diagnosis Date   Schizophrenia [facility])      Past Surgical History:  Procedure Laterality Date   COLONOSCOPY N/A 10/06/2021   Procedure: COLONOSCOPY;  Surgeon: Toledo, Benay Pike, MD;  Location: [facility] ENDOSCOPY;  Service: Gastroenterology;  Laterality: N/A;   ESOPHAGOGASTRODUODENOSCOPY N/A 10/06/2021   Procedure: ESOPHAGOGASTRODUODENOSCOPY (EGD);  Surgeon: Toledo, Benay Pike, MD;  Location: [facility] ENDOSCOPY;  Service: Gastroenterology;  Laterality: N/A;   PARTIAL COLECTOMY  10/13/2021   Procedure: RIGHT COLECTOMY;  Surgeon: Jules Husbands, MD;  Location: [facility] ORS;  Service: General;;    Social History   Socioeconomic History   Marital status: Single    Spouse name: Not on file   Number of children: Not on file   Years of education: Not on file   Highest education level: Not on file  Occupational History   Not on file  Tobacco Use   Smoking status: Every Day    Packs/day: 0.50    Years: 15.00    Total pack years: 7.50    Types: Cigarettes   Smokeless tobacco: Never  Vaping Use   Vaping Use: Never  used  Substance and Sexual Activity   Alcohol use: Not Currently   Drug use: No   Sexual activity: Never    Birth control/protection: None  Other Topics Concern   Not on file  Social History Narrative   Not on file   Social Determinants of Health   Financial Resource Strain: Not on file  Food Insecurity: Not on  file  Transportation Needs: Not on file  Physical Activity: Not on file  Stress: Not on file  Social Connections: Not on file  Intimate Partner Violence: Not on file    Family History  Problem Relation Age of Onset   Lung cancer Father    Hypertension Brother    Hypertension Brother    Hypertension Brother    Prostate cancer Brother      Current Outpatient Medications:    docusate sodium (COLACE) 100 MG capsule, Take 1 capsule (100 mg total) by mouth daily., Disp: 90 capsule, Rfl: 1   feeding supplement (ENSURE ENLIVE / ENSURE PLUS) LIQD, Take 237 mLs by mouth 3 (three) times daily between meals., Disp: 237 mL, Rfl: 12   ferrous sulfate 325 (65 FE) MG EC tablet, Take 1 tablet (325 mg total) by mouth daily., Disp: 90 tablet, Rfl: 1   haloperidol decanoate (HALDOL DECANOATE) 100 MG/ML injection, Inject 1 mL (100 mg total) into the muscle once for 1 dose. Next dose due 5/15, Disp: 1 mL, Rfl: 1   INVEGA SUSTENNA 234 MG/1.5ML injection, Inject into the muscle., Disp: , Rfl:   Review of Systems  Constitutional:  Positive for fatigue. Negative for appetite change, chills, fever and unexpected weight change.  HENT:   Negative for hearing loss and voice change.   Eyes:  Negative for eye problems and icterus.  Respiratory:  Negative for chest tightness, cough and shortness of breath.   Cardiovascular:  Negative for chest pain and leg swelling.  Gastrointestinal:  Negative for abdominal distention and abdominal pain.  Endocrine: Negative for hot flashes.  Genitourinary:  Negative for difficulty urinating, dysuria and frequency.   Musculoskeletal:  Negative for arthralgias.  Skin:  Negative for itching and rash.  Neurological:  Negative for light-headedness and numbness.  Hematological:  Negative for adenopathy. Does not bruise/bleed easily.  Psychiatric/Behavioral:  Negative for confusion.     Physical exam:  Vitals:   07/04/22 1522  BP: 99/78  Pulse: (!) 58  Resp: 18  Temp: (!)  96.9 F (36.1 C)  Weight: 138 lb 4.8 oz (62.7 kg)   Physical Exam Constitutional:      General: He is not in acute distress. HENT:     Head: Normocephalic and atraumatic.  Eyes:     General: No scleral icterus. Cardiovascular:     Rate and Rhythm: Normal rate.  Pulmonary:     Effort: Pulmonary effort is normal. No respiratory distress.     Breath sounds: No wheezing.  Abdominal:     General: Bowel sounds are normal. There is no distension.     Palpations: Abdomen is soft.  Musculoskeletal:        General: No deformity. Normal range of motion.     Cervical back: Normal range of motion and neck supple.  Skin:    General: Skin is warm and dry.     Findings: No erythema or rash.  Neurological:     Mental Status: He is alert. Mental status is at baseline.     Cranial Nerves: No cranial nerve deficit.     Comments: Slow  speech  Psychiatric:     Comments: non coherent speech     Labs    Latest Ref Rng & Units 07/04/2022    3:08 PM 05/11/2022    2:41 PM 12/29/2021    8:22 PM  CBC  WBC 4.0 - 10.5 K/uL 5.5  7.3  7.2   Hemoglobin 13.0 - 17.0 g/dL 9.8  8.0  11.4   Hematocrit 39.0 - 52.0 % 32.9  27.0  37.3   Platelets 150 - 400 K/uL 177  277  237       Latest Ref Rng & Units 05/11/2022    2:41 PM 12/29/2021    8:22 PM 10/15/2021    5:23 AM  CMP  Glucose 70 - 99 mg/dL 98  93  100   BUN 8 - 23 mg/dL '14  14  18   '$ Creatinine 0.61 - 1.24 mg/dL 0.87  0.97  1.06   Sodium 135 - 145 mmol/L 137  138  137   Potassium 3.5 - 5.1 mmol/L 3.8  4.1  4.5   Chloride 98 - 111 mmol/L 108  109  109   CO2 22 - 32 mmol/L '22  22  26   '$ Calcium 8.9 - 10.3 mg/dL 9.0  9.3  9.0   Total Protein 6.5 - 8.1 g/dL 7.4     Total Bilirubin 0.3 - 1.2 mg/dL 0.2     Alkaline Phos 38 - 126 U/L 82     AST 15 - 41 U/L 16     ALT 0 - 44 U/L 11      Lab Results  Component Value Date   IRON 37 (L) 07/04/2022   TIBC 344 07/04/2022   FERRITIN 11 (L) 07/04/2022      RADIOGRAPHIC STUDIES: I have personally  reviewed the radiological images as listed and agreed with the findings in the report. No results found.

## 2022-07-04 NOTE — Assessment & Plan Note (Addendum)
#  Stage I right colon cancer, pT1b pN0 M0 S/p right hemicolectomy. No adjuvant chemo was given.  CEA is trending up. With recurrent iron deficiency anemia, recommend GI work up patient adamantly declined.  I also discussed about repeat CT scan and he is not interested.

## 2022-07-04 NOTE — Addendum Note (Signed)
Addended by: Earlie Server on: 07/04/2022 10:20 PM   Modules accepted: Orders

## 2022-07-04 NOTE — Assessment & Plan Note (Signed)
#  Family history of colon cancer, personal history of colon cancer as well as tubular adenomas. Recommend genetic testing.  Previously referred to genetic counselor, did not establish care

## 2022-07-04 NOTE — Assessment & Plan Note (Addendum)
Hemoglobin has dropped. Iron panel is consistent with iron deficiency.  Recommend IV Feraheme weekly x 2 Need GI work up, however patient adamantly declined. He is only in agreement in getting IV Iron.

## 2022-07-05 ENCOUNTER — Telehealth: Payer: Self-pay

## 2022-07-05 NOTE — Progress Notes (Signed)
Ph note created   

## 2022-07-05 NOTE — Telephone Encounter (Signed)
-----  Message from Earlie Server, MD sent at 07/04/2022 10:17 PM EST ----- Please arrange him to get IV Feraheme weekly x 2 Recommend follow up in 3 months, labs prior to MD +/- Feraheme.  thanks zy

## 2022-07-05 NOTE — Telephone Encounter (Signed)
Called and spoke to [place] regarding MD recommendations and follow up plan. She verbalized understanding.   Please schedule and inform [place] / [name] of appts:   Feraheme weekly x2 Labs in 3 months  MD/ Feraheme 1-2 days after labs

## 2022-07-10 ENCOUNTER — Encounter: Payer: Self-pay | Admitting: Oncology

## 2022-07-11 ENCOUNTER — Inpatient Hospital Stay: Payer: 59 | Attending: Oncology

## 2022-07-11 VITALS — BP 110/81 | HR 78 | Temp 96.2°F | Resp 18

## 2022-07-11 DIAGNOSIS — D509 Iron deficiency anemia, unspecified: Secondary | ICD-10-CM | POA: Insufficient documentation

## 2022-07-11 DIAGNOSIS — C182 Malignant neoplasm of ascending colon: Secondary | ICD-10-CM | POA: Insufficient documentation

## 2022-07-11 DIAGNOSIS — D500 Iron deficiency anemia secondary to blood loss (chronic): Secondary | ICD-10-CM

## 2022-07-11 MED ORDER — SODIUM CHLORIDE 0.9 % IV SOLN
INTRAVENOUS | Status: DC
  Filled 2022-07-11: qty 250

## 2022-07-11 MED ORDER — SODIUM CHLORIDE 0.9 % IV SOLN
510.0000 mg | Freq: Once | INTRAVENOUS | Status: AC
  Administered 2022-07-11: 510 mg via INTRAVENOUS
  Filled 2022-07-11: qty 510

## 2022-07-11 NOTE — Patient Instructions (Signed)

## 2022-07-18 ENCOUNTER — Inpatient Hospital Stay: Payer: 59

## 2022-07-18 VITALS — BP 89/74 | HR 93 | Temp 97.8°F | Resp 16

## 2022-07-18 DIAGNOSIS — D500 Iron deficiency anemia secondary to blood loss (chronic): Secondary | ICD-10-CM

## 2022-07-18 DIAGNOSIS — C182 Malignant neoplasm of ascending colon: Secondary | ICD-10-CM | POA: Diagnosis not present

## 2022-07-18 DIAGNOSIS — D509 Iron deficiency anemia, unspecified: Secondary | ICD-10-CM | POA: Diagnosis not present

## 2022-07-18 MED ORDER — SODIUM CHLORIDE 0.9 % IV SOLN
510.0000 mg | Freq: Once | INTRAVENOUS | Status: AC
  Administered 2022-07-18: 510 mg via INTRAVENOUS
  Filled 2022-07-18: qty 510

## 2022-07-18 NOTE — Patient Instructions (Signed)

## 2022-07-20 ENCOUNTER — Emergency Department: Payer: 59

## 2022-07-20 ENCOUNTER — Other Ambulatory Visit: Payer: Self-pay

## 2022-07-20 ENCOUNTER — Inpatient Hospital Stay
Admission: EM | Admit: 2022-07-20 | Discharge: 2022-07-26 | DRG: 177 | Disposition: A | Discharge status: Home / Self Care | Payer: 59 | Attending: Osteopathic Medicine | Admitting: Osteopathic Medicine

## 2022-07-20 DIAGNOSIS — Z6821 Body mass index (BMI) 21.0-21.9, adult: Secondary | ICD-10-CM | POA: Diagnosis not present

## 2022-07-20 DIAGNOSIS — S0990XA Unspecified injury of head, initial encounter: Secondary | ICD-10-CM | POA: Diagnosis not present

## 2022-07-20 DIAGNOSIS — I447 Left bundle-branch block, unspecified: Secondary | ICD-10-CM | POA: Diagnosis present

## 2022-07-20 DIAGNOSIS — D630 Anemia in neoplastic disease: Secondary | ICD-10-CM | POA: Diagnosis not present

## 2022-07-20 DIAGNOSIS — R404 Transient alteration of awareness: Secondary | ICD-10-CM | POA: Diagnosis not present

## 2022-07-20 DIAGNOSIS — I10 Essential (primary) hypertension: Secondary | ICD-10-CM | POA: Diagnosis not present

## 2022-07-20 DIAGNOSIS — R55 Syncope and collapse: Secondary | ICD-10-CM | POA: Diagnosis not present

## 2022-07-20 DIAGNOSIS — Z85038 Personal history of other malignant neoplasm of large intestine: Secondary | ICD-10-CM

## 2022-07-20 DIAGNOSIS — F250 Schizoaffective disorder, bipolar type: Secondary | ICD-10-CM | POA: Diagnosis present

## 2022-07-20 DIAGNOSIS — E861 Hypovolemia: Secondary | ICD-10-CM

## 2022-07-20 DIAGNOSIS — J449 Chronic obstructive pulmonary disease, unspecified: Secondary | ICD-10-CM | POA: Diagnosis not present

## 2022-07-20 DIAGNOSIS — F17200 Nicotine dependence, unspecified, uncomplicated: Secondary | ICD-10-CM | POA: Diagnosis present

## 2022-07-20 DIAGNOSIS — K5730 Diverticulosis of large intestine without perforation or abscess without bleeding: Secondary | ICD-10-CM | POA: Diagnosis not present

## 2022-07-20 DIAGNOSIS — F17210 Nicotine dependence, cigarettes, uncomplicated: Secondary | ICD-10-CM | POA: Diagnosis not present

## 2022-07-20 DIAGNOSIS — A419 Sepsis, unspecified organism: Secondary | ICD-10-CM | POA: Diagnosis not present

## 2022-07-20 DIAGNOSIS — J439 Emphysema, unspecified: Secondary | ICD-10-CM | POA: Diagnosis not present

## 2022-07-20 DIAGNOSIS — E43 Unspecified severe protein-calorie malnutrition: Secondary | ICD-10-CM | POA: Diagnosis present

## 2022-07-20 DIAGNOSIS — E860 Dehydration: Secondary | ICD-10-CM | POA: Diagnosis not present

## 2022-07-20 DIAGNOSIS — Z79899 Other long term (current) drug therapy: Secondary | ICD-10-CM

## 2022-07-20 DIAGNOSIS — Z9049 Acquired absence of other specified parts of digestive tract: Secondary | ICD-10-CM | POA: Diagnosis not present

## 2022-07-20 DIAGNOSIS — Z8249 Family history of ischemic heart disease and other diseases of the circulatory system: Secondary | ICD-10-CM | POA: Diagnosis not present

## 2022-07-20 DIAGNOSIS — Z8042 Family history of malignant neoplasm of prostate: Secondary | ICD-10-CM | POA: Diagnosis not present

## 2022-07-20 DIAGNOSIS — D509 Iron deficiency anemia, unspecified: Secondary | ICD-10-CM | POA: Diagnosis not present

## 2022-07-20 DIAGNOSIS — G9341 Metabolic encephalopathy: Secondary | ICD-10-CM | POA: Diagnosis present

## 2022-07-20 DIAGNOSIS — N200 Calculus of kidney: Secondary | ICD-10-CM | POA: Diagnosis not present

## 2022-07-20 DIAGNOSIS — M2578 Osteophyte, vertebrae: Secondary | ICD-10-CM | POA: Diagnosis not present

## 2022-07-20 DIAGNOSIS — Z801 Family history of malignant neoplasm of trachea, bronchus and lung: Secondary | ICD-10-CM

## 2022-07-20 DIAGNOSIS — I959 Hypotension, unspecified: Secondary | ICD-10-CM | POA: Diagnosis not present

## 2022-07-20 DIAGNOSIS — J1282 Pneumonia due to coronavirus disease 2019: Secondary | ICD-10-CM | POA: Diagnosis not present

## 2022-07-20 DIAGNOSIS — U071 COVID-19: Principal | ICD-10-CM

## 2022-07-20 DIAGNOSIS — M47812 Spondylosis without myelopathy or radiculopathy, cervical region: Secondary | ICD-10-CM | POA: Diagnosis not present

## 2022-07-20 DIAGNOSIS — S199XXA Unspecified injury of neck, initial encounter: Secondary | ICD-10-CM | POA: Diagnosis not present

## 2022-07-20 DIAGNOSIS — I9589 Other hypotension: Secondary | ICD-10-CM | POA: Diagnosis not present

## 2022-07-20 DIAGNOSIS — R680 Hypothermia, not associated with low environmental temperature: Secondary | ICD-10-CM | POA: Diagnosis not present

## 2022-07-20 DIAGNOSIS — Z743 Need for continuous supervision: Secondary | ICD-10-CM | POA: Diagnosis not present

## 2022-07-20 DIAGNOSIS — J690 Pneumonitis due to inhalation of food and vomit: Secondary | ICD-10-CM | POA: Diagnosis not present

## 2022-07-20 DIAGNOSIS — E872 Acidosis, unspecified: Secondary | ICD-10-CM | POA: Diagnosis present

## 2022-07-20 DIAGNOSIS — I499 Cardiac arrhythmia, unspecified: Secondary | ICD-10-CM | POA: Diagnosis not present

## 2022-07-20 DIAGNOSIS — R918 Other nonspecific abnormal finding of lung field: Secondary | ICD-10-CM | POA: Diagnosis not present

## 2022-07-20 DIAGNOSIS — R6889 Other general symptoms and signs: Secondary | ICD-10-CM | POA: Diagnosis not present

## 2022-07-20 LAB — PROTIME-INR
INR: 1.1 (ref 0.8–1.2)
Prothrombin Time: 14.3 seconds (ref 11.4–15.2)

## 2022-07-20 LAB — CBC WITH DIFFERENTIAL/PLATELET
Abs Immature Granulocytes: 0.06 10*3/uL (ref 0.00–0.07)
Basophils Absolute: 0 10*3/uL (ref 0.0–0.1)
Basophils Relative: 0 %
Eosinophils Absolute: 0 10*3/uL (ref 0.0–0.5)
Eosinophils Relative: 0 %
HCT: 31.7 % — ABNORMAL LOW (ref 39.0–52.0)
Hemoglobin: 9.7 g/dL — ABNORMAL LOW (ref 13.0–17.0)
Immature Granulocytes: 1 %
Lymphocytes Relative: 10 %
Lymphs Abs: 0.8 10*3/uL (ref 0.7–4.0)
MCH: 25.3 pg — ABNORMAL LOW (ref 26.0–34.0)
MCHC: 30.6 g/dL (ref 30.0–36.0)
MCV: 82.6 fL (ref 80.0–100.0)
Monocytes Absolute: 0.4 10*3/uL (ref 0.1–1.0)
Monocytes Relative: 6 %
Neutro Abs: 6.4 10*3/uL (ref 1.7–7.7)
Neutrophils Relative %: 83 %
Platelets: 209 10*3/uL (ref 150–400)
RBC: 3.84 MIL/uL — ABNORMAL LOW (ref 4.22–5.81)
RDW: 21 % — ABNORMAL HIGH (ref 11.5–15.5)
Smear Review: NORMAL
WBC: 7.7 10*3/uL (ref 4.0–10.5)
nRBC: 0 % (ref 0.0–0.2)

## 2022-07-20 LAB — RESP PANEL BY RT-PCR (RSV, FLU A&B, COVID)  RVPGX2
Influenza A by PCR: NEGATIVE
Influenza B by PCR: NEGATIVE
Resp Syncytial Virus by PCR: NEGATIVE
SARS Coronavirus 2 by RT PCR: POSITIVE — AB

## 2022-07-20 LAB — COMPREHENSIVE METABOLIC PANEL
ALT: 18 U/L (ref 0–44)
AST: 27 U/L (ref 15–41)
Albumin: 3.1 g/dL — ABNORMAL LOW (ref 3.5–5.0)
Alkaline Phosphatase: 62 U/L (ref 38–126)
Anion gap: 12 (ref 5–15)
BUN: 20 mg/dL (ref 8–23)
CO2: 19 mmol/L — ABNORMAL LOW (ref 22–32)
Calcium: 8.7 mg/dL — ABNORMAL LOW (ref 8.9–10.3)
Chloride: 102 mmol/L (ref 98–111)
Creatinine, Ser: 1 mg/dL (ref 0.61–1.24)
GFR, Estimated: >60 mL/min (ref >60)
Glucose, Bld: 95 mg/dL (ref 70–99)
Potassium: 3.8 mmol/L (ref 3.5–5.1)
Sodium: 133 mmol/L — ABNORMAL LOW (ref 135–145)
Total Bilirubin: 2 mg/dL — ABNORMAL HIGH (ref 0.3–1.2)
Total Protein: 7.2 g/dL (ref 6.5–8.1)

## 2022-07-20 LAB — PROCALCITONIN: Procalcitonin: 0.23 ng/mL

## 2022-07-20 LAB — URINALYSIS, COMPLETE (UACMP) WITH MICROSCOPIC
Bilirubin Urine: NEGATIVE
Glucose, UA: NEGATIVE mg/dL
Hgb urine dipstick: NEGATIVE
Ketones, ur: 20 mg/dL — AB
Leukocytes,Ua: NEGATIVE
Nitrite: NEGATIVE
Protein, ur: 30 mg/dL — AB
Specific Gravity, Urine: >1.046 — ABNORMAL HIGH (ref 1.005–1.030)
pH: 5 (ref 5.0–8.0)

## 2022-07-20 LAB — LACTIC ACID, PLASMA: Lactic Acid, Venous: 1.7 mmol/L (ref 0.5–1.9)

## 2022-07-20 MED ORDER — VANCOMYCIN HCL IN DEXTROSE 1-5 GM/200ML-% IV SOLN: 1000.0000 mg | Freq: Once | INTRAVENOUS | Status: DC

## 2022-07-20 MED ORDER — ALBUTEROL SULFATE (2.5 MG/3ML) 0.083% IN NEBU: 2.5000 mg | INHALATION_SOLUTION | RESPIRATORY_TRACT | Status: DC | PRN

## 2022-07-20 MED ORDER — METRONIDAZOLE 500 MG/100ML IV SOLN
500.0000 mg | Freq: Two times a day (BID) | INTRAVENOUS | Status: DC
  Administered 2022-07-21: 500 mg via INTRAVENOUS
  Filled 2022-07-20 (×2): qty 100

## 2022-07-20 MED ORDER — POLYETHYLENE GLYCOL 3350 17 G PO PACK: 17.0000 g | PACK | Freq: Every day | ORAL | Status: DC | PRN

## 2022-07-20 MED ORDER — ACETAMINOPHEN 650 MG RE SUPP: 650.0000 mg | Freq: Four times a day (QID) | RECTAL | Status: DC | PRN

## 2022-07-20 MED ORDER — LACTATED RINGERS IV BOLUS (SEPSIS)
1000.0000 mL | Freq: Once | INTRAVENOUS | Status: AC
  Administered 2022-07-20: 1000 mL via INTRAVENOUS

## 2022-07-20 MED ORDER — SODIUM CHLORIDE 0.9 % IV SOLN
2.0000 g | Freq: Once | INTRAVENOUS | Status: AC
  Administered 2022-07-20: 2 g via INTRAVENOUS
  Filled 2022-07-20: qty 12.5

## 2022-07-20 MED ORDER — SODIUM CHLORIDE 0.9 % IV SOLN
2.0000 g | Freq: Three times a day (TID) | INTRAVENOUS | Status: DC
  Administered 2022-07-20 – 2022-07-21 (×2): 2 g via INTRAVENOUS
  Filled 2022-07-20 (×3): qty 12.5

## 2022-07-20 MED ORDER — ONDANSETRON HCL 4 MG PO TABS: 4.0000 mg | ORAL_TABLET | Freq: Four times a day (QID) | ORAL | Status: DC | PRN

## 2022-07-20 MED ORDER — ONDANSETRON HCL 4 MG/2ML IJ SOLN: 4.0000 mg | Freq: Four times a day (QID) | INTRAMUSCULAR | Status: DC | PRN

## 2022-07-20 MED ORDER — VANCOMYCIN HCL 1250 MG/250ML IV SOLN
1250.0000 mg | Freq: Once | INTRAVENOUS | Status: AC
  Administered 2022-07-20: 1250 mg via INTRAVENOUS
  Filled 2022-07-20: qty 250

## 2022-07-20 MED ORDER — ENOXAPARIN SODIUM 40 MG/0.4ML IJ SOSY
40.0000 mg | PREFILLED_SYRINGE | INTRAMUSCULAR | Status: DC
  Administered 2022-07-24: 40 mg via SUBCUTANEOUS
  Filled 2022-07-20 (×4): qty 0.4

## 2022-07-20 MED ORDER — METRONIDAZOLE 500 MG/100ML IV SOLN
500.0000 mg | Freq: Once | INTRAVENOUS | Status: AC
  Administered 2022-07-20: 500 mg via INTRAVENOUS
  Filled 2022-07-20: qty 100

## 2022-07-20 MED ORDER — ACETAMINOPHEN 325 MG PO TABS: 650.0000 mg | ORAL_TABLET | Freq: Four times a day (QID) | ORAL | Status: DC | PRN

## 2022-07-20 MED ORDER — IOHEXOL 300 MG/ML  SOLN
100.0000 mL | Freq: Once | INTRAMUSCULAR | Status: AC | PRN
  Administered 2022-07-20: 100 mL via INTRAVENOUS

## 2022-07-20 NOTE — ED Notes (Signed)
**  DECONTAMINATION COMPLETE** Patient arrived to ED with bed bugs. All clothing was removed, secured, and triple wrapped in plastic bags. Insects were isolated and put into specimen cup. Patient was given head-to-toe bath and new linens were placed on stretcher. Visual inspection of naked patient was done by 3 ED nurses and no bed bugs were identified. Charge RN aware that bed beds were present and that patient has been decontaminated.

## 2022-07-20 NOTE — ED Provider Notes (Signed)
I got signout on this patient, see provider's original H&P for full details.  He is pending sepsis workup, getting antibiotics, fluids.  I see the patient on a Bair hugger he says he is feeling better, he is here with his brother.  His blood pressure has gotten better.  He has baseline mentation per brother, history of schizophrenia, speaking nonsensically at times about spider monkeys, claims he was improperly diagnosed with cancer.  On review of systems for infectious symptoms he he says he has had a cough recently as well as some urgency for urination.  He says he had lightheadedness and a fall denies chest pain or shortness of breath palpitations.  He denies any significant trauma from his fall.  His lungs are clear and his abdomen is soft and no rigidity but he says it is mildly tender everywhere I press.  He is able to move all extremities with full active range of motion in his neck is supple with full range of motion there is no obvious head trauma.  Given his tender abdomen I am going to get a CT scan of his chest abdomen and pelvis with IV contrast to look for source of infection, as well as trauma, and a CT scan of his head and neck for fall.   Dustin Garfinkel, MD 07/20/22 2328

## 2022-07-20 NOTE — ED Notes (Signed)
Warm fluids administered and bear hugger applied

## 2022-07-20 NOTE — Assessment & Plan Note (Addendum)
Initially thought to be from dehydration from [place].  Received aggressive fluid resuscitation.  Blood pressure improved, but still low at times.  No evidence of worsening sepsis.  Also in part due to poor p.o. intake.  Patient started on midodrine and titrated upward.  He will continue this medication long-term.

## 2022-07-20 NOTE — Hospital Course (Addendum)
67 year old male with past medical history of colon cancer, iron deficiency anemia and schizophrenia who presented to the emergency room with weakness after passing out.  Patient stated that for the last 2 days he been dizzy and then found today at his house by brother and another caregiver.  EMS was called and initially blood pressure noted to be systolic in the [id number] and hypothermic.  Patient noted to be disheveled with bedbugs present.  Patient deemed contaminated and given IV fluids plus Bair hugger.  Lab work revealed positive for [place].  CT scan noteworthy for groundglass appearance from infection/aspiration in the left lower lobe and lingula.  Patient given IV antibiotics.  Hospitalist were called for further evaluation.

## 2022-07-20 NOTE — Progress Notes (Signed)
PHARMACY -  BRIEF ANTIBIOTIC NOTE   Pharmacy has received consult(s) for cefepime and vancomycin from an ED provider.  The patient's profile has been reviewed for ht/wt/allergies/indication/available labs.    One time order(s) placed for   1) vancomycin 1250 mg IV x 1  2) cefepime 2 grams IV x 1  Further antibiotics/pharmacy consults should be ordered by admitting physician if indicated.                       Thank you, Dallie Piles 07/20/2022  2:53 PM

## 2022-07-20 NOTE — Assessment & Plan Note (Addendum)
Sepsis ruled out.  Patient not hypoxic.  CRP minimally elevated at 8.5

## 2022-07-20 NOTE — ED Triage Notes (Addendum)
Pt to ED via [facility] from home. Family called due to possible syncopal episode. Initial BP 74/51. Family reports AMS and possible UTI. EMS unable to get temp. Repeat BP 92/62 after 200cc NS. Pt has bed bugs crawling on him on arrival. Pt deconned in room. Strong urine smell present   Ems VS:  CBG 105 20g LH HR 95 100% RA 29 RR

## 2022-07-20 NOTE — Assessment & Plan Note (Addendum)
Noted mildly elevated procalcitonin as well as findings on chest CT.  Patient given 1 dose of vancomycin, Flagyl and cefepime.  Discussed with pharmacy.  Procalcitonin with minimal change from previous day.  Will hold on further antibiotics and continue to monitor, Repeat procalcitonin in the morning

## 2022-07-20 NOTE — ED Provider Notes (Signed)
[facility] Provider Note    Event Date/Time   First MD Initiated Contact with Patient 07/20/22 1527     (approximate)   History   Hypotension   HPI  Dustin [name] is a 67 y.o. male with a history of colon cancer, iron deficiency anemia, schizophrenia and tardive dyskinesia who presents with syncope and generalized weakness.  The patient was found by family today to be confused and weak.  The patient himself endorses dizziness for the last few days but denies any other acute complaints.  EMS found him to be hypotensive.  I reviewed the past medical records.  The patient was most recently seen in the ED on 7/27 of last year for arm paresthesia.  His most recent outpatient encounter was with oncology on 1/30 for follow-up of his stage I colon cancer and anemia.   Physical Exam   Triage Vital Signs: ED Triage Vitals  Enc Vitals Group     BP 07/20/22 1427 (!) 76/65     Pulse Rate 07/20/22 1428 80     Resp 07/20/22 1427 (!) 26     Temp 07/20/22 1430 (!) 95.5 F (35.3 C)     Temp Source 07/20/22 1427 Rectal     SpO2 07/20/22 1427 96 %     Weight 07/20/22 1425 138 lb 4.8 oz (62.7 kg)     Height --      Head Circumference --      Peak Flow --      Pain Score 07/20/22 1425 0     Pain Loc --      Pain Edu? --      Excl. in [place]? --     Most recent vital signs: Vitals:   07/20/22 1530 07/20/22 1545  BP: (!) 116/57 110/78  Pulse: 65 64  Resp: 20 19  Temp:    SpO2: 100% 98%     General: Alert, oriented x 2, weak appearing. CV:  Good peripheral perfusion.  Normal heart sounds. Resp:  Normal effort.  Lungs CTAB. Abd:  Soft and nontender.  No distention.  Other:  Dry mucous membranes.  EOMI.  PERRLA.  No facial droop.  Motor intact in all extremities.   ED Results / Procedures / Treatments   Labs (all labs ordered are listed, but only abnormal results are displayed) Labs Reviewed  RESP PANEL BY RT-PCR (RSV, FLU A&B, COVID)  RVPGX2 -  Abnormal; Notable for the following components:      Result Value   SARS Coronavirus 2 by RT PCR POSITIVE (*)    All other components within normal limits  COMPREHENSIVE METABOLIC PANEL - Abnormal; Notable for the following components:   Sodium 133 (*)    CO2 19 (*)    Calcium 8.7 (*)    Albumin 3.1 (*)    Total Bilirubin 2.0 (*)    All other components within normal limits  CBC WITH DIFFERENTIAL/PLATELET - Abnormal; Notable for the following components:   RBC 3.84 (*)    Hemoglobin 9.7 (*)    HCT 31.7 (*)    MCH 25.3 (*)    RDW 21.0 (*)    All other components within normal limits  CULTURE, BLOOD (ROUTINE X 2)  CULTURE, BLOOD (ROUTINE X 2)  LACTIC ACID, PLASMA  PROTIME-INR  LACTIC ACID, PLASMA  URINALYSIS, ROUTINE W REFLEX MICROSCOPIC  URINALYSIS, COMPLETE (UACMP) WITH MICROSCOPIC     EKG  ED ECG REPORT I, Arta Silence, the attending physician, personally viewed  and interpreted this ECG.  Date: 07/20/2022 EKG Time: 1426 Rate: 83 Rhythm: normal sinus rhythm QRS Axis: normal Intervals: Incomplete LBBB ST/T Wave abnormalities: normal Narrative Interpretation: no evidence of acute ischemia    RADIOLOGY  Chest x-ray: I independently viewed and interpreted the images; there is a left sided groundglass opacity with no evidence of edema.   PROCEDURES:  Critical Care performed: Yes, see critical care procedure note(s)  .Critical Care  Performed by: Arta Silence, MD Authorized by: Arta Silence, MD   Critical care provider statement:    Critical care time (minutes):  30   Critical care time was exclusive of:  Separately billable procedures and treating other patients   Critical care was necessary to treat or prevent imminent or life-threatening deterioration of the following conditions:  Sepsis   Critical care was time spent personally by me on the following activities:  Development of treatment plan with patient or surrogate, discussions with  consultants, evaluation of patient's response to treatment, examination of patient, ordering and review of laboratory studies, ordering and review of radiographic studies, ordering and performing treatments and interventions, pulse oximetry, re-evaluation of patient's condition, review of old charts and obtaining history from patient or surrogate    MEDICATIONS ORDERED IN ED: Medications  vancomycin (VANCOREADY) IVPB 1250 mg/250 mL (has no administration in time range)  lactated ringers bolus 1,000 mL (0 mLs Intravenous Stopped 07/20/22 1520)    And  lactated ringers bolus 1,000 mL (1,000 mLs Intravenous New Bag/Given 07/20/22 1520)  ceFEPIme (MAXIPIME) 2 g in sodium chloride 0.9 % 100 mL IVPB (2 g Intravenous New Bag/Given 07/20/22 1501)  metroNIDAZOLE (FLAGYL) IVPB 500 mg (500 mg Intravenous New Bag/Given 07/20/22 1501)  iohexol (OMNIPAQUE) 300 MG/ML solution 100 mL (100 mLs Intravenous Contrast Given 07/20/22 1602)     IMPRESSION / MDM / [place] / ED COURSE  I reviewed the triage vital signs and the nursing notes.  67 year old male with PMH as noted above presents with possible syncope, generalized weakness, and altered mental status.  He was found to be hypotensive.  On exam in the ED he is weak appearing and somewhat confused but alert.  Neuro exam is nonfocal.  Differential diagnosis includes, but is not limited to, acute infection/sepsis, possible UTI versus pneumonia, COVID-19, influenza, other viral syndrome, dehydration, electrolyte abnormality, AKI, other metabolic disturbance, less likely primary cardiac or CNS cause.  We will obtain labs for sepsis workup, give an IV fluid bolus, empiric antibiotics per the sepsis protocol, and reassess.  I anticipate admission.  Patient's presentation is most consistent with acute presentation with potential threat to life or bodily function.  The patient is on the cardiac monitor to evaluate for evidence of arrhythmia and/or significant  heart rate changes.  ----------------------------------------- 3:15 PM on 07/20/2022 -----------------------------------------  Blood pressure is improving.  Lab workup is pending.  I have signed the patient out to the oncoming ED physician Dr. Ronalee Red.   FINAL CLINICAL IMPRESSION(S) / ED DIAGNOSES   Final diagnoses:  Sepsis, due to unspecified organism, unspecified whether acute organ dysfunction present [facility])     Rx / DC Orders   ED Discharge Orders     None        Note:  This document was prepared using Dragon voice recognition software and may include unintentional dictation errors.    Arta Silence, MD 07/20/22 [phone]

## 2022-07-20 NOTE — ED Notes (Signed)
Pt transported to CT ?

## 2022-07-20 NOTE — Assessment & Plan Note (Signed)
Likely secondary to COVID and hypotension.  According to patient's brother, resolved.

## 2022-07-20 NOTE — Assessment & Plan Note (Addendum)
Does not appear to be on any medications for this.  As needed Haldol for agitation.  Patient with bizarre thought processes.  Psychiatry consulted.  Patient is also requesting to go to a group home, will discuss with TOC.  We cannot send him directly to a group home from here, but we can help family with information and they can apply placement, which may take up to a month if accepted

## 2022-07-20 NOTE — H&P (Signed)
History and Physical    Patient: Dustin [name] [id number] DOB: Oct 23, 1955 DOA: 07/20/2022 DOS: the patient was seen and examined on 07/20/2022 PCP: Jonetta Osgood, NP  Patient coming from: Home  Chief Complaint:  Chief Complaint  Patient presents with   Hypotension   HPI: 67 year old male with past medical history of colon cancer, iron deficiency anemia and schizophrenia who presented to the emergency room with weakness after passing out.  Patient stated that for the last 2 days he been dizzy and then found today at his house by brother and caregiver.  EMS was called and initially blood pressure noted to be systolic in the Q000111Q and hypothermic.  Patient noted to be disheveled with bedbugs present.  Patient deemed contaminated and given IV fluids plus Bair hugger.  Lab work revealed positive for [place].  CT scan noteworthy for groundglass appearance from infection/aspiration in the left lower lobe and lingula.  Patient given IV antibiotics.  Hospitalist were called for further evaluation.  Review of Systems: Difficult to get any review of systems, given patient's agitation and psychosis. Past Medical History:  Diagnosis Date   Schizophrenia [facility])    Past Surgical History:  Procedure Laterality Date   COLONOSCOPY N/A 10/06/2021   Procedure: COLONOSCOPY;  Surgeon: Toledo, Benay Pike, MD;  Location: [facility] ENDOSCOPY;  Service: Gastroenterology;  Laterality: N/A;   ESOPHAGOGASTRODUODENOSCOPY N/A 10/06/2021   Procedure: ESOPHAGOGASTRODUODENOSCOPY (EGD);  Surgeon: Toledo, Benay Pike, MD;  Location: [facility] ENDOSCOPY;  Service: Gastroenterology;  Laterality: N/A;   PARTIAL COLECTOMY  10/13/2021   Procedure: RIGHT COLECTOMY;  Surgeon: Jules Husbands, MD;  Location: [facility] ORS;  Service: General;;   Social History:  reports that he has been smoking cigarettes. He has a 7.50 pack-year smoking history. He has never used smokeless tobacco. He reports that he does not currently use alcohol. He reports that  he does not use drugs.  No Known Allergies  Family History  Problem Relation Age of Onset   Lung cancer Father    Hypertension Brother    Hypertension Brother    Hypertension Brother    Prostate cancer Brother     Prior to Admission medications   Medication Sig Start Date End Date Taking? Authorizing Provider  docusate sodium (COLACE) 100 MG capsule Take 1 capsule (100 mg total) by mouth daily. 11/08/21   Earlie Server, MD  feeding supplement (ENSURE ENLIVE / ENSURE PLUS) LIQD Take 237 mLs by mouth 3 (three) times daily between meals. 10/17/21   Max Sane, MD  ferrous sulfate 325 (65 FE) MG EC tablet Take 1 tablet (325 mg total) by mouth daily. 11/08/21   Earlie Server, MD  haloperidol decanoate (HALDOL DECANOATE) 100 MG/ML injection Inject 1 mL (100 mg total) into the muscle once for 1 dose. Next dose due 5/15 07/16/18 07/04/22  [name], [name], MD  [facility] SUSTENNA 234 MG/1.5ML injection Inject into the muscle. 12/01/21   [provider]    Physical Exam: Vitals:   07/20/22 1545 07/20/22 1730 07/20/22 1812 07/20/22 1830  BP: 110/78   (!) 114/99  Pulse: 64 64 78 79  Resp: 19 17 17 18  $ Temp:   97.7 F (36.5 C)   TempSrc:   Oral   SpO2: 98% 98% 97% 96%  Weight:       General: Alert and oriented x 1-2, no immediate distress HEENT: Normocephalic, atraumatic, mucous membranes are slightly dry Cardiovascular: Regular rate and rhythm, S1-S2 Lungs: Decreased at bases Abdomen: Soft, nontender, nondistended, positive bowel sounds Extremities:  No clubbing or cyanosis, trace pitting edema Psychiatry: Patient with bizarre affect patient's and train of thought.  Appears to indeed be paranoid with some delusional behavior. Neuro: No overt deficits although exam limited  Data Reviewed:  Lab work noteworthy for procalcitonin 0.23 and hemoglobin 9.7.  Assessment and Plan: * Acute metabolic encephalopathy Likely secondary to COVID and hypotension.  According to patient's brother,  resolved.  COVID-19 virus infection Sepsis ruled out.  Patient not hypoxic.  Checking CRP.  Hypotension-resolved as of 07/20/2022 Due to dehydration from COVID.  Received aggressive fluid resuscitation.  Blood pressure now stable.  Aspiration pneumonia ([place]) Noted mildly elevated procalcitonin as well as findings on chest CT.  Patient given 1 dose of vancomycin, Flagyl and cefepime.  Continue antibiotics and will recheck labs in the morning  Schizoaffective disorder, bipolar type ([place]) Does not appear to be on any medications for this.  As needed Haldol for agitation  Microcytic anemia Hemoglobin stable  Tobacco use disorder Will counsel      Advance Care Planning: Full code  Consults: None  Family Communication: Will call brother  Severity of Illness: The appropriate patient status for this patient is INPATIENT. Inpatient status is judged to be reasonable and necessary in order to provide the required intensity of service to ensure the patient's safety. The patient's presenting symptoms, physical exam findings, and initial radiographic and laboratory data in the context of their chronic comorbidities is felt to place them at high risk for further clinical deterioration. Furthermore, it is not anticipated that the patient will be medically stable for discharge from the hospital within 2 midnights of admission.   * I certify that at the point of admission it is my clinical judgment that the patient will require inpatient hospital care spanning beyond 2 midnights from the point of admission due to high intensity of service, high risk for further deterioration and high frequency of surveillance required.*  Author: Annita Brod, MD 07/20/2022 8:12 PM  For on call review [email or web address].

## 2022-07-20 NOTE — Assessment & Plan Note (Addendum)
No evidence of overt obvious blood loss, although with hydration, hemoglobin went from 9.7 yesterday to 7.6 today.  That said, patient did present with hypotension so he may have had a subacute bleed.  Continue to follow H&H.  Transfuse for hemoglobin below 7

## 2022-07-20 NOTE — Assessment & Plan Note (Signed)
Will counsel

## 2022-07-21 DIAGNOSIS — G9341 Metabolic encephalopathy: Secondary | ICD-10-CM | POA: Diagnosis not present

## 2022-07-21 DIAGNOSIS — D509 Iron deficiency anemia, unspecified: Secondary | ICD-10-CM

## 2022-07-21 DIAGNOSIS — U071 COVID-19: Secondary | ICD-10-CM | POA: Diagnosis not present

## 2022-07-21 DIAGNOSIS — I9589 Other hypotension: Secondary | ICD-10-CM | POA: Diagnosis not present

## 2022-07-21 LAB — BLOOD CULTURE ID PANEL (REFLEXED) - BCID2
A.calcoaceticus-baumannii: NOT DETECTED
A.calcoaceticus-baumannii: NOT DETECTED
Bacteroides fragilis: NOT DETECTED
Bacteroides fragilis: NOT DETECTED
Candida albicans: NOT DETECTED
Candida albicans: NOT DETECTED
Candida auris: NOT DETECTED
Candida auris: NOT DETECTED
Candida glabrata: NOT DETECTED
Candida glabrata: NOT DETECTED
Candida krusei: NOT DETECTED
Candida krusei: NOT DETECTED
Candida parapsilosis: NOT DETECTED
Candida parapsilosis: NOT DETECTED
Candida tropicalis: NOT DETECTED
Candida tropicalis: NOT DETECTED
Cryptococcus neoformans/gattii: NOT DETECTED
Cryptococcus neoformans/gattii: NOT DETECTED
Enterobacter cloacae complex: NOT DETECTED
Enterobacter cloacae complex: NOT DETECTED
Enterobacterales: NOT DETECTED
Enterobacterales: NOT DETECTED
Enterococcus Faecium: NOT DETECTED
Enterococcus Faecium: NOT DETECTED
Enterococcus faecalis: NOT DETECTED
Enterococcus faecalis: NOT DETECTED
Escherichia coli: NOT DETECTED
Escherichia coli: NOT DETECTED
Haemophilus influenzae: NOT DETECTED
Haemophilus influenzae: NOT DETECTED
Klebsiella aerogenes: NOT DETECTED
Klebsiella aerogenes: NOT DETECTED
Klebsiella oxytoca: NOT DETECTED
Klebsiella oxytoca: NOT DETECTED
Klebsiella pneumoniae: NOT DETECTED
Klebsiella pneumoniae: NOT DETECTED
Listeria monocytogenes: NOT DETECTED
Listeria monocytogenes: NOT DETECTED
Neisseria meningitidis: NOT DETECTED
Neisseria meningitidis: NOT DETECTED
Proteus species: NOT DETECTED
Proteus species: NOT DETECTED
Pseudomonas aeruginosa: NOT DETECTED
Pseudomonas aeruginosa: NOT DETECTED
Salmonella species: NOT DETECTED
Salmonella species: NOT DETECTED
Serratia marcescens: NOT DETECTED
Serratia marcescens: NOT DETECTED
Staphylococcus aureus (BCID): NOT DETECTED
Staphylococcus aureus (BCID): NOT DETECTED
Staphylococcus epidermidis: NOT DETECTED
Staphylococcus epidermidis: NOT DETECTED
Staphylococcus lugdunensis: NOT DETECTED
Staphylococcus lugdunensis: NOT DETECTED
Staphylococcus species: DETECTED — AB
Staphylococcus species: NOT DETECTED
Stenotrophomonas maltophilia: NOT DETECTED
Stenotrophomonas maltophilia: NOT DETECTED
Streptococcus agalactiae: NOT DETECTED
Streptococcus agalactiae: NOT DETECTED
Streptococcus pneumoniae: NOT DETECTED
Streptococcus pneumoniae: NOT DETECTED
Streptococcus pyogenes: NOT DETECTED
Streptococcus pyogenes: NOT DETECTED
Streptococcus species: NOT DETECTED
Streptococcus species: NOT DETECTED

## 2022-07-21 LAB — RETICULOCYTES
Immature Retic Fract: 33.1 % — ABNORMAL HIGH (ref 2.3–15.9)
RBC.: 2.82 MIL/uL — ABNORMAL LOW (ref 4.22–5.81)
Retic Count, Absolute: 38.6 10*3/uL (ref 19.0–186.0)
Retic Ct Pct: 1.4 % (ref 0.4–3.1)

## 2022-07-21 LAB — COMPREHENSIVE METABOLIC PANEL
ALT: 13 U/L (ref 0–44)
AST: 15 U/L (ref 15–41)
Albumin: 2.4 g/dL — ABNORMAL LOW (ref 3.5–5.0)
Alkaline Phosphatase: 50 U/L (ref 38–126)
Anion gap: 6 (ref 5–15)
BUN: 15 mg/dL (ref 8–23)
CO2: 20 mmol/L — ABNORMAL LOW (ref 22–32)
Calcium: 8.1 mg/dL — ABNORMAL LOW (ref 8.9–10.3)
Chloride: 110 mmol/L (ref 98–111)
Creatinine, Ser: 0.8 mg/dL (ref 0.61–1.24)
GFR, Estimated: >60 mL/min (ref >60)
Glucose, Bld: 111 mg/dL — ABNORMAL HIGH (ref 70–99)
Potassium: 3.7 mmol/L (ref 3.5–5.1)
Sodium: 136 mmol/L (ref 135–145)
Total Bilirubin: 1.1 mg/dL (ref 0.3–1.2)
Total Protein: 5.7 g/dL — ABNORMAL LOW (ref 6.5–8.1)

## 2022-07-21 LAB — CBC
HCT: 24.5 % — ABNORMAL LOW (ref 39.0–52.0)
Hemoglobin: 7.6 g/dL — ABNORMAL LOW (ref 13.0–17.0)
MCH: 25.2 pg — ABNORMAL LOW (ref 26.0–34.0)
MCHC: 31 g/dL (ref 30.0–36.0)
MCV: 81.1 fL (ref 80.0–100.0)
Platelets: 219 10*3/uL (ref 150–400)
RBC: 3.02 MIL/uL — ABNORMAL LOW (ref 4.22–5.81)
RDW: 20.5 % — ABNORMAL HIGH (ref 11.5–15.5)
WBC: 6.8 10*3/uL (ref 4.0–10.5)
nRBC: 0 % (ref 0.0–0.2)

## 2022-07-21 LAB — PROCALCITONIN: Procalcitonin: 0.21 ng/mL

## 2022-07-21 LAB — IRON AND TIBC
Iron: 70 ug/dL (ref 45–182)
Saturation Ratios: 39 % (ref 17.9–39.5)
TIBC: 179 ug/dL — ABNORMAL LOW (ref 250–450)
UIBC: 109 ug/dL

## 2022-07-21 LAB — C-REACTIVE PROTEIN: CRP: 8.5 mg/dL — ABNORMAL HIGH (ref <1.0)

## 2022-07-21 LAB — VITAMIN B12: Vitamin B-12: 495 pg/mL (ref 180–914)

## 2022-07-21 LAB — FERRITIN: Ferritin: 949 ng/mL — ABNORMAL HIGH (ref 24–336)

## 2022-07-21 LAB — FOLATE: Folate: 6.3 ng/mL (ref >5.9)

## 2022-07-21 MED ORDER — ENSURE ENLIVE PO LIQD
237.0000 mL | Freq: Three times a day (TID) | ORAL | Status: DC
  Administered 2022-07-21 – 2022-07-26 (×12): 237 mL via ORAL

## 2022-07-21 MED ORDER — SODIUM CHLORIDE 0.9 % IV SOLN
2.0000 g | Freq: Three times a day (TID) | INTRAVENOUS | Status: DC
  Administered 2022-07-21 – 2022-07-24 (×9): 2 g via INTRAVENOUS
  Filled 2022-07-21 (×2): qty 2
  Filled 2022-07-21: qty 12.5
  Filled 2022-07-21: qty 2
  Filled 2022-07-21: qty 12.5
  Filled 2022-07-21 (×3): qty 2
  Filled 2022-07-21: qty 12.5
  Filled 2022-07-21: qty 2

## 2022-07-21 MED ORDER — SODIUM CHLORIDE 0.9 % IV SOLN: INTRAVENOUS | Status: DC

## 2022-07-21 MED ORDER — ADULT MULTIVITAMIN W/MINERALS CH
1.0000 | ORAL_TABLET | Freq: Every day | ORAL | Status: DC
  Administered 2022-07-22 – 2022-07-26 (×5): 1 via ORAL
  Filled 2022-07-21 (×5): qty 1

## 2022-07-21 NOTE — Progress Notes (Signed)
PHARMACY - PHYSICIAN COMMUNICATION CRITICAL VALUE ALERT - BLOOD CULTURE IDENTIFICATION (BCID)  Dustin [name] is an 67 y.o. male who presented to [facility] on 07/20/2022 with a chief complaint of acute metabolic encephalopathy secondary to COVID-19 infection.  Assessment:  1/4 bottles (aerobic bottle) growing gram negative organism, unknown morphology at this time.  No BCID result.    Name of physician (or Provider) Contacted: Dr. [name]  Current antibiotics: None  Changes to prescribed antibiotics recommended:  Recommended resumption of cefepime, MD agreeded with recommendation   Lorin Picket, PharmD 07/21/2022  4:41 PM

## 2022-07-21 NOTE — Progress Notes (Signed)
[facility] Progress Note  Patient: Dustin [name]    [id number]  DOA: 07/20/2022    Date of Service: the patient was seen and examined on 07/21/2022  Brief hospital course: 67 year old male with past medical history of colon cancer, iron deficiency anemia and schizophrenia who presented to the emergency room with weakness after passing out.  Patient stated that for the last 2 days he been dizzy and then found today at his house by brother and caregiver.  EMS was called and initially blood pressure noted to be systolic in the Q000111Q and hypothermic.  Patient noted to be disheveled with bedbugs present.  Patient deemed contaminated and given IV fluids plus Bair hugger.  Lab work revealed positive for [place].  CT scan noteworthy for groundglass appearance from infection/aspiration in the left lower lobe and lingula.  Patient given IV antibiotics.  Hospitalist were called for further evaluation.   Assessment and Plan: * Acute metabolic encephalopathy-resolved as of 07/21/2022 Likely secondary to COVID and hypotension.  According to patient's brother, resolved.  COVID-19 virus infection Sepsis ruled out.  Patient not hypoxic.  Checking CRP.  Hypotension-resolved as of 07/20/2022 Due to dehydration from COVID.  Received aggressive fluid resuscitation.  Blood pressure now stable.  Aspiration pneumonia ([place]) Noted mildly elevated procalcitonin as well as findings on chest CT.  Patient given 1 dose of vancomycin, Flagyl and cefepime.  Discussed with pharmacy.  Procalcitonin with minimal change from previous day.  Will hold on further antibiotics and continue to monitor  Schizoaffective disorder, bipolar type ([place]) Does not appear to be on any medications for this.  As needed Haldol for agitation.  Patient with bizarre thought processes.  Will ask psychiatry for consultation.  Patient is also requesting to go to a group home, will discuss with TOC  Microcytic anemia No evidence of overt obvious  blood loss, although with hydration, hemoglobin went from 9.7 yesterday to 7.6 today.  That said, patient did present with hypotension so he may have had a subacute bleed.  Continue to follow H&H.  Transfuse for hemoglobin below 7  Tobacco use disorder Will counsel       Body mass index is 21.66 kg/m.        Consultants: None  Procedures: None  Antimicrobials: IV Flagyl, cefepime and vancomycin x 1 2/15  Code Status: Full code   Subjective: Patient with no complaints, continues to have unusual thought processes  Objective: Vital signs were reviewed and unremarkable. Vitals:   07/21/22 0513 07/21/22 0930  BP: 92/62 105/70  Pulse: 64 (!) 59  Resp: 16 18  Temp: 97.6 F (36.4 C) 97.7 F (36.5 C)  SpO2: 92% 100%    Intake/Output Summary (Last 24 hours) at 07/21/2022 1428 Last data filed at 07/21/2022 1300 Gross per 24 hour  Intake --  Output 625 ml  Net -625 ml   Filed Weights   07/20/22 1425  Weight: 62.7 kg   Body mass index is 21.66 kg/m.  Exam:  General: Alert and oriented x 2, no acute distress HEENT: Normocephalic, atraumatic, mucous membranes slightly dry Cardiovascular: Regular rate and rhythm, S1-S2 Respiratory: Clear to auscultation bilaterally soft Abdomen:, Nontender, nondistended, positive bowel sounds Musculoskeletal: No clubbing or cyanosis or edema Skin: No skin breaks, tears or lesions Psychiatry: Interactive, but with bizarre thought processes Neurology: No focal deficits  Data Reviewed: Hemoglobin at 7.6 today.  Noted low TIBC and elevated ferritin with normal iron, consistent with anemia of chronic disease  Disposition:  Status is: Inpatient  Remains inpatient appropriate because:  -Stabilization of hypotension -This ensuring hemoglobin is stable    Anticipated discharge date: 2/19  Family Communication: Will call brother DVT Prophylaxis: enoxaparin (LOVENOX) injection 40 mg Start: 07/20/22 2200    Author: Annita Brod ,MD 07/21/2022 2:28 PM  To reach On-call, see care teams to locate the attending and reach out via [email or web address]. Between 7PM-7AM, please contact night-coverage If you still have difficulty reaching the attending provider, please page the [facility] (Director on Call) for [facility] on amion for assistance.

## 2022-07-21 NOTE — Progress Notes (Signed)
Initial Nutrition Assessment  DOCUMENTATION CODES:   Severe malnutrition in context of social or environmental circumstances  INTERVENTION:   Ensure Enlive po TID, each supplement provides 350 kcal and 20 grams of protein.  Magic cup TID with meals, each supplement provides 290 kcal and 9 grams of protein  MVI po daily   Pt at high refeed risk; recommend monitor potassium, magnesium and phosphorus labs daily until stable  NUTRITION DIAGNOSIS:   Severe Malnutrition related to social / environmental circumstances as evidenced by severe fat depletion, severe muscle depletion.  GOAL:   Patient will meet greater than or equal to 90% of their needs  MONITOR:   PO intake, Supplement acceptance, Labs, Weight trends, Skin, I & O's  REASON FOR ASSESSMENT:   Consult Assessment of nutrition requirement/status  ASSESSMENT:   67 y.o. male with a h/o iron deficiency anemia, schizophrenia, tardive dyskinesia and colon cancer s/p R colectomy 10/13/21 and who is admitted with COVID 19.  Met with pt in room today. Pt with some confusion today. Pt reports that he has a listening device in his tooth. Pt reports good appetite at baseline but reports that his neighbor has been stealing his food. Pt does report that he likes strawberry and chocolate nutritional drinks. Spoke with RN who reports that patient ate well today. RD will add supplements and MVI to help pt meet his estimated needs. Pt is at refeed risk. Per chart, pt appears weight stable at baseline.   Medications reviewed and include: lovenox, NaCl @100ml$ /hr, cefepime   Labs reviewed: K 3.7 wnl Hgb 7.6(L), Hct 24.5(L)  NUTRITION - FOCUSED PHYSICAL EXAM:  Flowsheet Row Most Recent Value  Orbital Region Moderate depletion  Upper Arm Region Severe depletion  Thoracic and Lumbar Region Severe depletion  Buccal Region Moderate depletion  Temple Region Severe depletion  Clavicle Bone Region Severe depletion  Clavicle and Acromion  Bone Region Severe depletion  Scapular Bone Region Moderate depletion  Dorsal Hand Severe depletion  Patellar Region Severe depletion  Anterior Thigh Region Severe depletion  Posterior Calf Region Severe depletion  Edema (RD Assessment) None  Hair Reviewed  Eyes Reviewed  Mouth Reviewed  Skin Reviewed  Nails Reviewed   Diet Order:   Diet Order             Diet regular Room service appropriate? Yes; Fluid consistency: Thin  Diet effective now                  EDUCATION NEEDS:   Education needs have been addressed  Skin:  Skin Assessment: Reviewed RN Assessment  Last BM:  2/13  Height:   Ht Readings from Last 1 Encounters:  07/21/22 5' 7"$  (1.702 m)    Weight:   Wt Readings from Last 1 Encounters:  07/21/22 61.6 kg    Ideal Body Weight:  67.2 kg  BMI:  Body mass index is 21.28 kg/m.  Estimated Nutritional Needs:   Kcal:  1900-2200kcal/day  Protein:  95-110g/day  Fluid:  1.7-1.9L/day  Koleen Distance MS, RD, LDN Please refer to [facility] for RD and/or RD on-call/weekend/after hours pager

## 2022-07-21 NOTE — TOC Initial Note (Signed)
Transition of Care [facility]) - Initial/Assessment Note    Patient Details  Name: Dustin [name] MRN: [id number] Date of Birth: Dec 25, 1955  Transition of Care [facility]) CM/SW Contact:    Laurena Slimmer, RN Phone Number: 07/21/2022, 1:56 PM  Clinical Narrative:                  Transition of Care [facility]) Screening Note   Patient Details  Name: Dustin [name] Date of Birth: 1955-11-20   Transition of Care [facility]) CM/SW Contact:    Laurena Slimmer, RN Phone Number: 07/21/2022, 1:56 PM    Transition of Care Department [facility]) has reviewed patient and no TOC needs have been identified at this time. We will continue to monitor patient advancement through interdisciplinary progression rounds. If new patient transition needs arise, please place a TOC consult.          Patient Goals and CMS Choice            Expected Discharge Plan and Services                                              Prior Living Arrangements/Services                       Activities of Daily Living Home Assistive Devices/Equipment: Cane (specify quad or straight), Hearing aid    Permission Sought/Granted                  Emotional Assessment              Admission diagnosis:  Acute metabolic encephalopathy 99991111 Sepsis, due to unspecified organism, unspecified whether acute organ dysfunction present ([place]) [A41.9] COVID [U07.1] Pneumonia due to COVID-19 virus [U07.1, J12.82] Patient Active Problem List   Diagnosis Date Noted   Acute metabolic encephalopathy Q000111Q   COVID-19 virus infection 07/20/2022   Aspiration pneumonia ([place]) 07/20/2022   Adenocarcinoma of colon ([place]) 12/13/2021   Malignant neoplasm of ascending colon ([place]) 11/08/2021   Family history of cancer 11/08/2021   IDA (iron deficiency anemia)    Colonic mass 10/06/2021   Microcytic anemia 10/05/2021   Syncope 10/05/2021   Symptomatic anemia    Schizoaffective disorder, bipolar type ([place])  01/24/2017   Tobacco use disorder 11/29/2016   PCP:  Jonetta Osgood, NP Pharmacy:   [facility] [address]), [place] [address] Phone: [phone] Fax: [phone]     Social Determinants of Health (SDOH) Social History: [place]: High Risk (07/21/2022)  Alcohol Screen: Low Risk  (03/06/2022)  Depression (PHQ2-9): Low Risk  (03/06/2022)  Tobacco Use: High Risk (07/20/2022)   SDOH Interventions: Housing Interventions: Inpatient TOC   Readmission Risk Interventions     No data to display

## 2022-07-22 DIAGNOSIS — I959 Hypotension, unspecified: Secondary | ICD-10-CM

## 2022-07-22 DIAGNOSIS — G9341 Metabolic encephalopathy: Secondary | ICD-10-CM | POA: Diagnosis not present

## 2022-07-22 DIAGNOSIS — E43 Unspecified severe protein-calorie malnutrition: Secondary | ICD-10-CM | POA: Diagnosis present

## 2022-07-22 DIAGNOSIS — U071 COVID-19: Secondary | ICD-10-CM | POA: Diagnosis not present

## 2022-07-22 LAB — BASIC METABOLIC PANEL
Anion gap: 6 (ref 5–15)
BUN: 8 mg/dL (ref 8–23)
CO2: 18 mmol/L — ABNORMAL LOW (ref 22–32)
Calcium: 7.8 mg/dL — ABNORMAL LOW (ref 8.9–10.3)
Chloride: 111 mmol/L (ref 98–111)
Creatinine, Ser: 0.8 mg/dL (ref 0.61–1.24)
GFR, Estimated: >60 mL/min (ref >60)
Glucose, Bld: 114 mg/dL — ABNORMAL HIGH (ref 70–99)
Potassium: 3.4 mmol/L — ABNORMAL LOW (ref 3.5–5.1)
Sodium: 135 mmol/L (ref 135–145)

## 2022-07-22 LAB — CBC
HCT: 25.4 % — ABNORMAL LOW (ref 39.0–52.0)
Hemoglobin: 8 g/dL — ABNORMAL LOW (ref 13.0–17.0)
MCH: 25.6 pg — ABNORMAL LOW (ref 26.0–34.0)
MCHC: 31.5 g/dL (ref 30.0–36.0)
MCV: 81.2 fL (ref 80.0–100.0)
Platelets: 222 10*3/uL (ref 150–400)
RBC: 3.13 MIL/uL — ABNORMAL LOW (ref 4.22–5.81)
RDW: 20.9 % — ABNORMAL HIGH (ref 11.5–15.5)
WBC: 6 10*3/uL (ref 4.0–10.5)
nRBC: 0.3 % — ABNORMAL HIGH (ref 0.0–0.2)

## 2022-07-22 LAB — PHOSPHORUS: Phosphorus: 1.8 mg/dL — ABNORMAL LOW (ref 2.5–4.6)

## 2022-07-22 LAB — MAGNESIUM: Magnesium: 1.9 mg/dL (ref 1.7–2.4)

## 2022-07-22 MED ORDER — MIDODRINE HCL 5 MG PO TABS
5.0000 mg | ORAL_TABLET | Freq: Three times a day (TID) | ORAL | Status: DC
  Administered 2022-07-22 – 2022-07-23 (×3): 5 mg via ORAL
  Filled 2022-07-22 (×3): qty 1

## 2022-07-22 MED ORDER — SODIUM CHLORIDE 0.9 % IV SOLN: INTRAVENOUS | Status: AC

## 2022-07-22 MED ORDER — POTASSIUM PHOSPHATES 15 MMOLE/5ML IV SOLN
30.0000 mmol | Freq: Once | INTRAVENOUS | Status: AC
  Administered 2022-07-22: 30 mmol via INTRAVENOUS
  Filled 2022-07-22: qty 10

## 2022-07-22 NOTE — Progress Notes (Signed)
       CROSS COVER NOTE  NAME: Dustin [name] MRN: [id number] DOB : 1956-01-17    HPI/Events of Note   Contacted by pharmacists regarding Gram neg bacteria in 1 of 4 bottles but the lab tech said it was unusual shape "somewhere between a rod and cocci" .... he has also grown out gram positive rods in the same bottle but the species is not identified , when it was plated at [facility] it only grew out gram neg bacteria ... he's currently on cefepime which should cover most gram neg species but does not have anything ordered to cover gram positive rods ... this could be a contaminant btw or could be something legit , I'm honestly not sure . Cefepime will cover anaerobic gram pos rods ... one option would be to start vanc which would improve coverage   Assessment and  Interventions   Assessment: Afebrile White count normal K 3.4, phos 1.8, bicarb 18, Na 135 Anion gap 6 Plan: Defer to attending regarding antibiotic adjustment if needed Start NS at 125/h for acidosis 30 mmol k phos ordered      Kathlene Cote NP [facility]

## 2022-07-22 NOTE — Progress Notes (Signed)
[facility] Progress Note  Patient: Dustin [name]    [id number]  DOA: 07/20/2022    Date of Service: the patient was seen and examined on 07/22/2022  Brief hospital course: 67 year old male with past medical history of colon cancer, iron deficiency anemia and schizophrenia who presented to the emergency room with weakness after passing out.  Patient stated that for the last 2 days he been dizzy and then found today at his house by brother and caregiver.  EMS was called and initially blood pressure noted to be systolic in the Q000111Q and hypothermic.  Patient noted to be disheveled with bedbugs present.  Patient deemed contaminated and given IV fluids plus Bair hugger.  Lab work revealed positive for [place].  CT scan noteworthy for groundglass appearance from infection/aspiration in the left lower lobe and lingula.  Patient given IV antibiotics.  Hospitalist were called for further evaluation.   Assessment and Plan: * Acute metabolic encephalopathy-resolved as of 07/21/2022 Likely secondary to COVID and hypotension.  According to patient's brother, resolved.  COVID-19 virus infection Sepsis ruled out.  Patient not hypoxic.  CRP minimally elevated at 8.5  Hypotension-resolved as of 07/20/2022 Due to dehydration from COVID.  Received aggressive fluid resuscitation.  Blood pressure improved, but still low at times.  No evidence of worsening sepsis.  In part due to poor p.o. intake.  Have added midodrine.  Aspiration pneumonia ([place]) Noted mildly elevated procalcitonin as well as findings on chest CT.  Patient given 1 dose of vancomycin, Flagyl and cefepime.  Discussed with pharmacy.  Procalcitonin with minimal change from previous day.  Will hold on further antibiotics and continue to monitor, Repeat procalcitonin in the morning  Schizoaffective disorder, bipolar type ([place]) Does not appear to be on any medications for this.  As needed Haldol for agitation.  Patient with bizarre thought  processes.  Will ask psychiatry for consultation.  Patient is also requesting to go to a group home, will discuss with TOC  Microcytic anemia No evidence of overt obvious blood loss, although with hydration, hemoglobin went from 9.7 yesterday to 7.6 today.  That said, patient did present with hypotension so he may have had a subacute bleed.  Continue to follow H&H.  Transfuse for hemoglobin below 7  Tobacco use disorder Will counsel  Protein-calorie malnutrition, severe Nutrition Status: Nutrition Problem: Severe Malnutrition Etiology: social / environmental circumstances Signs/Symptoms: severe fat depletion, severe muscle depletion   Appreciate nutrition help.  Liberalize diet.  Added supplement shakes.        Body mass index is 21.28 kg/m.  Nutrition Problem: Severe Malnutrition Etiology: social / environmental circumstances     Consultants: None  Procedures: None  Antimicrobials: IV Flagyl, cefepime and vancomycin x 1 2/15  Code Status: Full code   Subjective: Patient with no complaints, continues to have unusual thought processes  Objective: Vital signs were reviewed and unremarkable. Vitals:   07/22/22 1229 07/22/22 1401  BP:  98/78  Pulse: 62 67  Resp:  20  Temp:  97.6 F (36.4 C)  SpO2: 100% 100%    Intake/Output Summary (Last 24 hours) at 07/22/2022 1513 Last data filed at 07/22/2022 [id number] Gross per 24 hour  Intake 1605.59 ml  Output 300 ml  Net 1305.59 ml   Filed Weights   07/20/22 1425 07/21/22 1636  Weight: 62.7 kg 61.6 kg   Body mass index is 21.28 kg/m.  Exam:  General: Alert and oriented x 2, no acute distress HEENT: Normocephalic, atraumatic, mucous membranes slightly  dry Cardiovascular: Regular rate and rhythm, S1-S2 Respiratory: Clear to auscultation bilaterally soft Abdomen:, Nontender, nondistended, positive bowel sounds Musculoskeletal: No clubbing or cyanosis or edema Skin: No skin breaks, tears or lesions Psychiatry:  Interactive, but with bizarre thought processes Neurology: No focal deficits  Data Reviewed: Hemoglobin 8.0, phosphorus at 1.8  Disposition:  Status is: Inpatient Remains inpatient appropriate because:  -Stabilization of hypotension -This ensuring hemoglobin is stable    Anticipated discharge date: 2/18  Family Communication: Will call brother DVT Prophylaxis: enoxaparin (LOVENOX) injection 40 mg Start: 07/20/22 2200    Author: Annita Brod ,MD 07/22/2022 3:13 PM  To reach On-call, see care teams to locate the attending and reach out via [email or web address]. Between 7PM-7AM, please contact night-coverage If you still have difficulty reaching the attending provider, please page the [facility] (Director on Call) for [facility] on amion for assistance.

## 2022-07-22 NOTE — Assessment & Plan Note (Signed)
Nutrition Status: Nutrition Problem: Severe Malnutrition Etiology: social / environmental circumstances Signs/Symptoms: severe fat depletion, severe muscle depletion   Appreciate nutrition help.  Liberalize diet.  Added supplement shakes. Patient also put on multivitamin.

## 2022-07-22 NOTE — Progress Notes (Signed)
MEWS Progress Note  Patient Details Name: Dustin [name] MRN: [id number] DOB: [birth year] Today's Date: 07/22/2022   MEWS Flowsheet Documentation:  Assess: MEWS Score Temp: 98.7 F (37.1 C) BP: (!) 87/67 MAP (mmHg): 74 Pulse Rate: 62 ECG Heart Rate: 80 Resp: 18 Level of Consciousness: Alert SpO2: 100 % O2 Device: Room Air Assess: MEWS Score MEWS Temp: 0 MEWS Systolic: 1 MEWS Pulse: 0 MEWS RR: 0 MEWS LOC: 0 MEWS Score: 1 MEWS Score Color: Green Assess: SIRS CRITERIA SIRS Temperature : 0 SIRS Respirations : 0 SIRS Pulse: 0 SIRS WBC: 0 SIRS Score Sum : 0 SIRS Temperature : 0 SIRS Pulse: 0 SIRS Respirations : 0 SIRS WBC: 0 SIRS Score Sum : 0 Assess: if the MEWS score is Yellow or Red Were vital signs taken at a resting state?: Yes Focused Assessment: Change from prior assessment (see assessment flowsheet) Does the patient meet 2 or more of the SIRS criteria?: No MEWS guidelines implemented : Yes, yellow Treat MEWS Interventions: Considered administering scheduled or prn medications/treatments as ordered Take Vital Signs Increase Vital Sign Frequency : Yellow: Q2hr x1, continue Q4hrs until patient remains green for 12hrs Escalate MEWS: Escalate: Yellow: Discuss with charge nurse and consider notifying provider and/or RRT Notify: Charge Nurse/RN Name of Charge Nurse/RN Notified: Transport planner Provider Notification Provider Name/Title: Vernell Barrier Date Provider Notified: 07/22/22 Time Provider Notified: 1100 Method of Notification: Page Notification Reason: Other (Comment) (Low BP) Provider response: See new orders Date of Provider Response: 07/22/22 Time of Provider Response: [place] 07/22/2022, 1:51 PM

## 2022-07-23 DIAGNOSIS — U071 COVID-19: Secondary | ICD-10-CM | POA: Diagnosis not present

## 2022-07-23 DIAGNOSIS — I959 Hypotension, unspecified: Secondary | ICD-10-CM | POA: Diagnosis not present

## 2022-07-23 DIAGNOSIS — E43 Unspecified severe protein-calorie malnutrition: Secondary | ICD-10-CM | POA: Diagnosis not present

## 2022-07-23 DIAGNOSIS — G9341 Metabolic encephalopathy: Secondary | ICD-10-CM | POA: Diagnosis not present

## 2022-07-23 LAB — CBC
HCT: 26.9 % — ABNORMAL LOW (ref 39.0–52.0)
Hemoglobin: 8.3 g/dL — ABNORMAL LOW (ref 13.0–17.0)
MCH: 24.6 pg — ABNORMAL LOW (ref 26.0–34.0)
MCHC: 30.9 g/dL (ref 30.0–36.0)
MCV: 79.8 fL — ABNORMAL LOW (ref 80.0–100.0)
Platelets: 231 10*3/uL (ref 150–400)
RBC: 3.37 MIL/uL — ABNORMAL LOW (ref 4.22–5.81)
RDW: 21.2 % — ABNORMAL HIGH (ref 11.5–15.5)
WBC: 6.9 10*3/uL (ref 4.0–10.5)
nRBC: 0.6 % — ABNORMAL HIGH (ref 0.0–0.2)

## 2022-07-23 LAB — BASIC METABOLIC PANEL
Anion gap: 5 (ref 5–15)
BUN: 9 mg/dL (ref 8–23)
CO2: 22 mmol/L (ref 22–32)
Calcium: 8.1 mg/dL — ABNORMAL LOW (ref 8.9–10.3)
Chloride: 109 mmol/L (ref 98–111)
Creatinine, Ser: 0.7 mg/dL (ref 0.61–1.24)
GFR, Estimated: >60 mL/min (ref >60)
Glucose, Bld: 109 mg/dL — ABNORMAL HIGH (ref 70–99)
Potassium: 4.2 mmol/L (ref 3.5–5.1)
Sodium: 136 mmol/L (ref 135–145)

## 2022-07-23 LAB — CULTURE, BLOOD (ROUTINE X 2)

## 2022-07-23 LAB — MAGNESIUM: Magnesium: 1.9 mg/dL (ref 1.7–2.4)

## 2022-07-23 LAB — PROCALCITONIN: Procalcitonin: <0.1 ng/mL

## 2022-07-23 LAB — PHOSPHORUS: Phosphorus: 2.1 mg/dL — ABNORMAL LOW (ref 2.5–4.6)

## 2022-07-23 MED ORDER — MIDODRINE HCL 5 MG PO TABS
10.0000 mg | ORAL_TABLET | Freq: Three times a day (TID) | ORAL | Status: DC
  Administered 2022-07-23 – 2022-07-24 (×5): 10 mg via ORAL
  Filled 2022-07-23 (×5): qty 2

## 2022-07-23 NOTE — Progress Notes (Signed)
[facility] Progress Note  Patient: Dustin [name]    [id number]  DOA: 07/20/2022    Date of Service: the patient was seen and examined on 07/23/2022  Brief hospital course: 67 year old male with past medical history of colon cancer, iron deficiency anemia and schizophrenia who presented to the emergency room with weakness after passing out.  Patient stated that for the last 2 days he been dizzy and then found today at his house by brother and caregiver.  EMS was called and initially blood pressure noted to be systolic in the [id number] and hypothermic.  Patient noted to be disheveled with bedbugs present.  Patient deemed contaminated and given IV fluids plus Bair hugger.  Lab work revealed positive for O'Brien.  CT scan noteworthy for groundglass appearance from infection/aspiration in the left lower lobe and lingula.  Patient given IV antibiotics.  Hospitalist were called for further evaluation.   Assessment and Plan: * Acute metabolic encephalopathy-resolved as of 07/21/2022 Likely secondary to COVID and hypotension.  According to patient's brother, resolved.  COVID-19 virus infection Sepsis ruled out.  Patient not hypoxic.  CRP minimally elevated at 8.5  Hypotension-resolved as of 07/20/2022 Due to dehydration from COVID.  Received aggressive fluid resuscitation.  Blood pressure improved, but still low at times.  No evidence of worsening sepsis.  In part due to poor p.o. intake.  Blood pressure mild improvement so increase the dose.  Aspiration pneumonia ([place]) Noted mildly elevated procalcitonin as well as findings on chest CT.  Patient given 1 dose of vancomycin, Flagyl and cefepime.  Discussed with pharmacy.  Procalcitonin with minimal change from previous day.  Further antibiotics initially held, but then unusual blood cultures for questionable anaerobic gram-positive rods so cefepime continued.  Procalcitonin now normalized.  Would favor continuing cefepime until 2/19 for total of 5 days  of therapy.It is possible, blood culture is a contaminant.  Schizoaffective disorder, bipolar type ([place]) Does not appear to be on any medications for this.  As needed Haldol for agitation.  Patient with bizarre thought processes.  Psychiatry consulted.  Patient is also requesting to go to a group home, will discuss with TOC.  We cannot send him directly to a group home from here, but we can help family with information and they can apply placement, which may take up to a month if accepted  Microcytic anemia No evidence of overt obvious blood loss, although with hydration, hemoglobin went from 9.7 yesterday to 7.6 today.  That said, patient did present with hypotension so he may have had a subacute bleed.  Continue to follow H&H.  Transfuse for hemoglobin below 7  Tobacco use disorder Will counsel  Protein-calorie malnutrition, severe Nutrition Status: Nutrition Problem: Severe Malnutrition Etiology: social / environmental circumstances Signs/Symptoms: severe fat depletion, severe muscle depletion   Appreciate nutrition help.  Liberalize diet.  Added supplement shakes.        Body mass index is 21.28 kg/m.  Nutrition Problem: Severe Malnutrition Etiology: social / environmental circumstances     Consultants: None  Procedures: None  Antimicrobials: IV Flagyl, vancomycin x 1 2/15 IV cefepime 2/15-present  Code Status: Full code   Subjective: Patient continues to have bizarre ideas, thought processes  Objective: Vital signs were reviewed and unremarkable. Vitals:   07/23/22 0516 07/23/22 0849  BP: 97/71 105/84  Pulse: 73 (!) 55  Resp: 16   Temp: 97.8 F (36.6 C)   SpO2: 98% 98%    Intake/Output Summary (Last 24 hours) at 07/23/2022 1642 Last  data filed at 07/23/2022 0700 Gross per 24 hour  Intake 120 ml  Output 1050 ml  Net -930 ml   Filed Weights   07/20/22 1425 07/21/22 1636  Weight: 62.7 kg 61.6 kg   Body mass index is 21.28  kg/m.  Exam:  General: Alert and oriented x 2, no acute distress HEENT: Normocephalic, atraumatic, mucous membranes slightly dry Cardiovascular: Regular rate and rhythm, S1-S2 Respiratory: Clear to auscultation bilaterally soft Abdomen:, Nontender, nondistended, positive bowel sounds Musculoskeletal: No clubbing or cyanosis or edema Skin: No skin breaks, tears or lesions Psychiatry: Interactive, but with bizarre thought processes Neurology: No focal deficits  Data Reviewed: Hemoglobin has improved to 8.3.  Procalcitonin normal.  Disposition:  Status is: Inpatient Remains inpatient appropriate because:  -Stabilization of hypotension -Psychiatry consult    Anticipated discharge date: 2/19  Family Communication: Updated brother by phone DVT Prophylaxis: enoxaparin (LOVENOX) injection 40 mg Start: 07/20/22 2200    Author: Annita Brod ,MD 07/23/2022 4:42 PM  To reach On-call, see care teams to locate the attending and reach out via [email or web address]. Between 7PM-7AM, please contact night-coverage If you still have difficulty reaching the attending provider, please page the [facility] (Director on Call) for [facility] on amion for assistance.

## 2022-07-23 NOTE — Progress Notes (Addendum)
PHARMACY - PHYSICIAN COMMUNICATION CRITICAL VALUE ALERT - BLOOD CULTURE IDENTIFICATION (BCID)  Dustin [name] is an 67 y.o. male who presented to [facility] on 07/20/2022 with a chief complaint of covid  -entering for Violeta Gelinas RPh from 07/22/22  Assessment:   Gram neg bacteria in 1 of 4 bottles but the lab tech said it was unusual shape "somewhere between a rod and cocci" .... he has also grown out gram positive rods in the same bottle but the species is not identified , when it was plated at [facility] it only grew out gram neg bacteria ... he's currently on cefepime which should cover most gram neg species but does not have anything ordered to cover gram positive rods ... this could be a contaminant btw or could be something legit , I'm honestly not sure . Cefepime will cover anaerobic gram pos rods ... one option would be to start vanc which would improve coverage    Name of physician (or Provider) Contacted: B.Morrison NP  Current antibiotics: Cefepime  Changes to prescribed antibiotics recommended:  Per NP: Defer to attending regarding antibiotic adjustment if needed  -Krishan added to above secure chat -chat seen by MD, no new abx added  Pt is afebrile, WBC wnl, covid + , PCT 0.21   Results for orders placed or performed during the hospital encounter of 07/20/22  Blood Culture ID Panel (Reflexed) (Collected: 07/20/2022  2:32 PM)  Result Value Ref Range   Enterococcus faecalis NOT DETECTED NOT DETECTED   Enterococcus Faecium NOT DETECTED NOT DETECTED   Listeria monocytogenes NOT DETECTED NOT DETECTED   Staphylococcus species NOT DETECTED NOT DETECTED   Staphylococcus aureus (BCID) NOT DETECTED NOT DETECTED   Staphylococcus epidermidis NOT DETECTED NOT DETECTED   Staphylococcus lugdunensis NOT DETECTED NOT DETECTED   Streptococcus species NOT DETECTED NOT DETECTED   Streptococcus agalactiae NOT DETECTED NOT DETECTED   Streptococcus pneumoniae NOT DETECTED NOT DETECTED    Streptococcus pyogenes NOT DETECTED NOT DETECTED   A.calcoaceticus-baumannii NOT DETECTED NOT DETECTED   Bacteroides fragilis NOT DETECTED NOT DETECTED   Enterobacterales NOT DETECTED NOT DETECTED   Enterobacter cloacae complex NOT DETECTED NOT DETECTED   Escherichia coli NOT DETECTED NOT DETECTED   Klebsiella aerogenes NOT DETECTED NOT DETECTED   Klebsiella oxytoca NOT DETECTED NOT DETECTED   Klebsiella pneumoniae NOT DETECTED NOT DETECTED   Proteus species NOT DETECTED NOT DETECTED   Salmonella species NOT DETECTED NOT DETECTED   Serratia marcescens NOT DETECTED NOT DETECTED   Haemophilus influenzae NOT DETECTED NOT DETECTED   Neisseria meningitidis NOT DETECTED NOT DETECTED   Pseudomonas aeruginosa NOT DETECTED NOT DETECTED   Stenotrophomonas maltophilia NOT DETECTED NOT DETECTED   Candida albicans NOT DETECTED NOT DETECTED   Candida auris NOT DETECTED NOT DETECTED   Candida glabrata NOT DETECTED NOT DETECTED   Candida krusei NOT DETECTED NOT DETECTED   Candida parapsilosis NOT DETECTED NOT DETECTED   Candida tropicalis NOT DETECTED NOT DETECTED   Cryptococcus neoformans/gattii NOT DETECTED NOT DETECTED    Jaydyn Menon A 07/23/2022  3:24 PM

## 2022-07-23 NOTE — TOC Progression Note (Addendum)
Transition of Care [facility]) - Progression Note    Patient Details  Name: Arjenis Indovina MRN: [id number] Date of Birth: 14-Dec-1955  Transition of [facility]) CM/SW Contact  Izola Price, RN Phone Number: 07/23/2022, 1:02 PM  Clinical Narrative:  2/18: PT indicated that patient would like to go to a group home on discharge. Prior CM hand off indicated this was discussed that family would have to initiate this process. Of note, patient was brought in from home per ED triage nurse notes and documented patient had bed bugs crawling on him and was decontaminated in ED room. Simmie Davies RN CM          Expected Discharge Plan and Services                                               Social Determinants of Health (SDOH) Interventions [place]: High Risk (07/21/2022)  Alcohol Screen: Low Risk  (03/06/2022)  Depression (PHQ2-9): Low Risk  (03/06/2022)  Tobacco Use: High Risk (07/20/2022)    Readmission Risk Interventions     No data to display

## 2022-07-23 NOTE — Evaluation (Signed)
Physical Therapy Evaluation Patient Details Name: Dustin [name] MRN: [id number] DOB: Mar 28, 1956 Today's Date: 07/23/2022  History of Present Illness  67 year old male with past medical history of colon cancer, iron deficiency anemia and schizophrenia who presented to the emergency room with weakness after passing out.  Patient stated that for the last 2 days he been dizzy and then found today at his house by brother and caregiver.  Clinical Impression  Pt pleasant and interactive but did need consistent redirection with tangential (and sometimes bizarre) rants.  Unsure of true cognitive baseline but he did need safety reinforcement and basic cuing t/o the session.  Overall he was able to do mobility and limited ambulation w/o need for much direct assist but again safety and general awareness was lacking w/o LOBs, dizziness, unsteadiness, etc.  He apparently usually uses only SPC, but did rely on the b/l UE support today, surprising himself that he did the 35 ft he was able to do today (initially said "I don't think I could do any walking right now.").  Pt did discuss that he feels he may not be able to manage living alone much longer and asked about group homes/ALFs, states neither he nor family has looked into options.  Pt is mobile but not at baseline, recommend continued PT to address functional limitations.       Recommendations for follow up therapy are one component of a multi-disciplinary discharge planning process, led by the attending physician.  Recommendations may be updated based on patient status, additional functional criteria and insurance authorization.  Follow Up Recommendations Home health PT      Assistance Recommended at Discharge Intermittent Supervision/Assistance  Patient can return home with the following  Assistance with cooking/housework;Assist for transportation;Help with stairs or ramp for entrance    Equipment Recommendations None recommended by PT (should  confirm that he does have a walker)  Recommendations for Other Services       Functional Status Assessment Patient has had a recent decline in their functional status and demonstrates the ability to make significant improvements in function in a reasonable and predictable amount of time.     Precautions / Restrictions Precautions Precautions: Fall Restrictions Weight Bearing Restrictions: No      Mobility  Bed Mobility Overal bed mobility: Modified Independent             General bed mobility comments: Pt was able to get himself up to sitting EOB w/o phyiscal assist    Transfers Overall transfer level: Needs assistance Equipment used: Rolling walker (2 wheels) Transfers: Sit to/from Stand Sit to Stand: Min guard           General transfer comment: Pt initially indicating that he didn't think he could get up but with cuing for set up/sequencing was able to rise w/o direct assist    Ambulation/Gait Ambulation/Gait assistance: Min guard Gait Distance (Feet): 35 Feet Assistive device: Rolling walker (2 wheels)         General Gait Details: Pt able to manage slow but safe bout of in-room ambulation.  No dizziness, LOBs or overt safety concerns with the effort.  O2 remained in the 90s on room air, reports feeling minimally weaker than his baseline  Stairs            Wheelchair Mobility    Modified Rankin (Stroke Patients Only)       Balance Overall balance assessment: Needs assistance Sitting-balance support: No upper extremity supported Sitting balance-Leahy Scale: Good  Standing balance support: Bilateral upper extremity supported Standing balance-Leahy Scale: Fair Standing balance comment: reports normally using SPC only, no LOBs but reliant on b/l UEs/AD during standing/gait tasks                             Pertinent Vitals/Pain Pain Assessment Pain Assessment: No/denies pain    Home Living Family/patient expects to be  discharged to:: Unsure (voices desire to transition to group home) Living Arrangements: Alone Available Help at Discharge:  (reports sibling checks in occasionally) Type of Home: Mobile home Home Access: Ramped entrance       Home Layout: One level Home Equipment: Shower seat;Cane - single Barista (2 wheels);BSC/3in1      Prior Function Prior Level of Function : Independent/Modified Independent             Mobility Comments: pt reports MOD I with SPC for amb ADLs Comments: pt reports MOD I -I in all ADL, with light cooking and cleaning, staing that no longer drives     Hand Dominance        Extremity/Trunk Assessment   Upper Extremity Assessment Upper Extremity Assessment: Overall WFL for tasks assessed;Generalized weakness    Lower Extremity Assessment Lower Extremity Assessment: Overall WFL for tasks assessed;Generalized weakness       Communication   Communication: No difficulties  Cognition Arousal/Alertness: Awake/alert Behavior During Therapy: Restless Overall Cognitive Status: Difficult to assess                                 General Comments: Pt pleasant and engaged, but very consistently speaking off topic - often progressing in bizzare tangentical ways - on arrival pt had several papers full of written rants (with actual and made up words) and drawings that he referenced when PT asked how he was doing and what was going on.  Pt did know Month and year and that he was at the hospital but it was difficult to truly keep him on topic        General Comments General comments (skin integrity, edema, etc.): Pt's with minimal reported fatigue, O2 remains in the high 90s on room air, HR in the 60-70s    Exercises     Assessment/Plan    PT Assessment Patient needs continued PT services  PT Problem List Decreased strength;Decreased activity tolerance;Decreased balance;Decreased mobility;Decreased knowledge of use of DME;Decreased  safety awareness       PT Treatment Interventions DME instruction;Gait training;Stair training;Functional mobility training;Therapeutic activities;Therapeutic exercise;Balance training;Neuromuscular re-education;Patient/family education    PT Goals (Current goals can be found in the Care Plan section)  Acute Rehab PT Goals Patient Stated Goal: get into a group home or ALF PT Goal Formulation: With patient Time For Goal Achievement: 08/05/22 Potential to Achieve Goals: Good    Frequency Min 2X/week     Co-evaluation               AM-PAC PT "6 Clicks" Mobility  Outcome Measure Help needed turning from your back to your side while in a flat bed without using bedrails?: None Help needed moving from lying on your back to sitting on the side of a flat bed without using bedrails?: None Help needed moving to and from a bed to a chair (including a wheelchair)?: A Little Help needed standing up from a chair using your arms (e.g., wheelchair or bedside chair)?:  A Little Help needed to walk in hospital room?: A Little Help needed climbing 3-5 steps with a railing? : A Little 6 Click Score: 20    End of Session   Activity Tolerance: Patient tolerated treatment well Patient left: with chair alarm set;with call bell/phone within reach Nurse Communication: Mobility status PT Visit Diagnosis: Muscle weakness (generalized) (M62.81);Difficulty in walking, not elsewhere classified (R26.2)    Time: [id number] PT Time Calculation (min) (ACUTE ONLY): 28 min   Charges:   PT Evaluation $PT Eval Low Complexity: 1 Low PT Treatments $Gait Training: 8-22 mins        Kreg Shropshire, DPT 07/23/2022, 1:19 PM

## 2022-07-24 DIAGNOSIS — F250 Schizoaffective disorder, bipolar type: Secondary | ICD-10-CM

## 2022-07-24 DIAGNOSIS — E43 Unspecified severe protein-calorie malnutrition: Secondary | ICD-10-CM | POA: Diagnosis not present

## 2022-07-24 DIAGNOSIS — U071 COVID-19: Secondary | ICD-10-CM | POA: Diagnosis not present

## 2022-07-24 DIAGNOSIS — G9341 Metabolic encephalopathy: Secondary | ICD-10-CM | POA: Diagnosis not present

## 2022-07-24 DIAGNOSIS — I959 Hypotension, unspecified: Secondary | ICD-10-CM | POA: Diagnosis not present

## 2022-07-24 LAB — CBC
HCT: 28.3 % — ABNORMAL LOW (ref 39.0–52.0)
Hemoglobin: 8.8 g/dL — ABNORMAL LOW (ref 13.0–17.0)
MCH: 25 pg — ABNORMAL LOW (ref 26.0–34.0)
MCHC: 31.1 g/dL (ref 30.0–36.0)
MCV: 80.4 fL (ref 80.0–100.0)
Platelets: 266 10*3/uL (ref 150–400)
RBC: 3.52 MIL/uL — ABNORMAL LOW (ref 4.22–5.81)
RDW: 22 % — ABNORMAL HIGH (ref 11.5–15.5)
WBC: 7.8 10*3/uL (ref 4.0–10.5)
nRBC: 0.6 % — ABNORMAL HIGH (ref 0.0–0.2)

## 2022-07-24 LAB — BASIC METABOLIC PANEL
Anion gap: 6 (ref 5–15)
BUN: 13 mg/dL (ref 8–23)
CO2: 23 mmol/L (ref 22–32)
Calcium: 8.3 mg/dL — ABNORMAL LOW (ref 8.9–10.3)
Chloride: 102 mmol/L (ref 98–111)
Creatinine, Ser: 1.09 mg/dL (ref 0.61–1.24)
GFR, Estimated: >60 mL/min (ref >60)
Glucose, Bld: 93 mg/dL (ref 70–99)
Potassium: 4.3 mmol/L (ref 3.5–5.1)
Sodium: 131 mmol/L — ABNORMAL LOW (ref 135–145)

## 2022-07-24 LAB — PHOSPHORUS: Phosphorus: 2.2 mg/dL — ABNORMAL LOW (ref 2.5–4.6)

## 2022-07-24 LAB — MAGNESIUM: Magnesium: 1.9 mg/dL (ref 1.7–2.4)

## 2022-07-24 MED ORDER — SODIUM CHLORIDE 0.9 % IV SOLN
2.0000 g | Freq: Two times a day (BID) | INTRAVENOUS | Status: AC
  Administered 2022-07-25 (×2): 2 g via INTRAVENOUS
  Filled 2022-07-24 (×2): qty 12.5

## 2022-07-24 MED ORDER — SODIUM CHLORIDE 0.9 % IV BOLUS
500.0000 mL | Freq: Once | INTRAVENOUS | Status: AC
  Administered 2022-07-24: 500 mL via INTRAVENOUS

## 2022-07-24 MED ORDER — SODIUM CHLORIDE 0.9 % IV SOLN: INTRAVENOUS | Status: DC

## 2022-07-24 MED ORDER — MIDODRINE HCL 5 MG PO TABS
15.0000 mg | ORAL_TABLET | Freq: Three times a day (TID) | ORAL | Status: DC
  Administered 2022-07-25 – 2022-07-26 (×6): 15 mg via ORAL
  Filled 2022-07-24 (×6): qty 3

## 2022-07-24 MED ORDER — PALIPERIDONE PALMITATE ER 234 MG/1.5ML IM SUSY
234.0000 mg | PREFILLED_SYRINGE | Freq: Once | INTRAMUSCULAR | Status: AC
  Administered 2022-07-25: 234 mg via INTRAMUSCULAR
  Filled 2022-07-24 (×2): qty 1.5

## 2022-07-24 MED ORDER — HALOPERIDOL DECANOATE 100 MG/ML IM SOLN
100.0000 mg | Freq: Once | INTRAMUSCULAR | Status: AC
  Administered 2022-07-25: 100 mg via INTRAMUSCULAR
  Filled 2022-07-24: qty 1

## 2022-07-24 NOTE — Progress Notes (Signed)
PHARMACY NOTE:  ANTIMICROBIAL RENAL DOSAGE ADJUSTMENT  Current antimicrobial regimen includes a mismatch between antimicrobial dosage and estimated renal function.  As per policy approved by the Pharmacy & Therapeutics and Medical Executive Committees, the antimicrobial dosage will be adjusted accordingly.  Current antimicrobial dosage:   Cefepime 2 gm IV q8h-   Day 5  Indication: bacteremia  Renal Function:  Estimated Creatinine Clearance: 58.1 mL/min (by C-G formula based on SCr of 1.09 mg/dL).     Antimicrobial dosage has been changed to:   Cefepime 2gm IV q12h  for Crcl <60 ml/min  Additional comments: Cefepime 2/15>> Metronidazole 2/15>>2/16 Vancomycin x 1 dose 2/15  Thank you for allowing pharmacy to be a part of this patient's care.  Chinita Greenland PharmD Clinical Pharmacist 07/24/2022

## 2022-07-24 NOTE — Consult Note (Signed)
[place] [facility] Consult   Reason for Consult:  Delusions  Referring Physician:  Dr. [name] Patient Identification: Dustin [name] MRN:  [id number] Principal Diagnosis: Schizoaffective disorder, bipolar type ([place]) Diagnosis:  Principal Problem:   Schizoaffective disorder, bipolar type ([place]) Active Problems:   Tobacco use disorder   Microcytic anemia   Protein-calorie malnutrition, severe   Total Time spent with patient: 1 hour  Subjective:   Dustin [name] is a 67 y.o. male client being seen at the request of the medical team for delusions. Chart reviewed and patient seen face to face. Client observed sitting up in a Geri-chair at the bedside. Client was calm, cooperative, and engaged easily in conversation. Client presented with some delusional thinking, expressing the belief that there is a microchip in his tooth. The purpose and origin of this perceived microchip are unclear, but it does not seem to be causing the client distress. Client also expresses a desire to move out of his house due to concerns about "tobacco ticks" in his neighborhood.Client denies suicidal or homicidal ideations. Client denies experiencing auditory or visual hallucinations. Client is followed by [name] and reports he is compliant with his appointments which are conducted at his home twice a week. Client expressed a desire to move to a group home, to socialize amongst "people my age".    Collateral: [name] nurse, Estill Bamberg reported patient is followed by an ACT team who goes out to his house twice a week. Estill Bamberg reports, current medication list includes Invega 234 mg and Haldol 100 mg injections; both due tomorrow 07/25/2022.   Plan: Patient does not meet criteria for inpatient psychiatric admission. We will order Haldol Decanoate 100 mg and Invega Sustenna at 234 mg injections to be given 07/25/2022. Continue to follow-up with [name] team on discharge. Reviewed with Dr.  [name].   HPI: Per Dr. Cherylann Banas 07/20/22, Dustin [name] is a 67 y.o. male with a history of colon cancer, iron deficiency anemia, schizophrenia and tardive dyskinesia who presents with syncope and generalized weakness.  The patient was found by family today to be confused and weak.  The patient himself endorses dizziness for the last few days but denies any other acute complaints.  EMS found him to be hypotensive.   I reviewed the past medical records. The patient was most recently seen in the ED on 7/27 of last year for arm paresthesia.  His most recent outpatient encounter was with oncology on 1/30 for follow-up of his stage I colon cancer and anemia.   Past Psychiatric History:  Schizophrenia  Multiple hospitalizations  Risk to Self:  No  Risk to Others:  No  Prior Inpatient Therapy:  Yes  Prior Outpatient Therapy:  Yes   Past Medical History:  Past Medical History:  Diagnosis Date   Schizophrenia [facility])     Past Surgical History:  Procedure Laterality Date   COLONOSCOPY N/A 10/06/2021   Procedure: COLONOSCOPY;  Surgeon: Toledo, Benay Pike, MD;  Location: [facility] ENDOSCOPY;  Service: Gastroenterology;  Laterality: N/A;   ESOPHAGOGASTRODUODENOSCOPY N/A 10/06/2021   Procedure: ESOPHAGOGASTRODUODENOSCOPY (EGD);  Surgeon: Toledo, Benay Pike, MD;  Location: [facility] ENDOSCOPY;  Service: Gastroenterology;  Laterality: N/A;   PARTIAL COLECTOMY  10/13/2021   Procedure: RIGHT COLECTOMY;  Surgeon: Jules Husbands, MD;  Location: [facility] ORS;  Service: General;;   Family History:  Family History  Problem Relation Age of Onset   Lung cancer Father    Hypertension Brother    Hypertension Brother  Hypertension Brother    Prostate cancer Brother    Family Psychiatric  History: Chart reviewed. No known pertinent history. Social History:  Social History   Substance and Sexual Activity  Alcohol Use Not Currently     Social History   Substance and Sexual Activity  Drug Use No    Social  History   Socioeconomic History   Marital status: Single    Spouse name: Not on file   Number of children: Not on file   Years of education: Not on file   Highest education level: Not on file  Occupational History   Not on file  Tobacco Use   Smoking status: Every Day    Packs/day: 0.50    Years: 15.00    Total pack years: 7.50    Types: Cigarettes   Smokeless tobacco: Never  Vaping Use   Vaping Use: Never used  Substance and Sexual Activity   Alcohol use: Not Currently   Drug use: No   Sexual activity: Never    Birth control/protection: None  Other Topics Concern   Not on file  Social History Narrative   Not on file   Social Determinants of Health   Financial Resource Strain: Not on file  Food Insecurity: Not on file  Transportation Needs: Not on file  Physical Activity: Not on file  Stress: Not on file  Social Connections: Not on file   Additional Social History:    Allergies:  No Known Allergies  Labs:  Results for orders placed or performed during the hospital encounter of 07/20/22 (from the past 48 hour(s))  Basic metabolic panel     Status: Abnormal   Collection Time: 07/23/22  4:40 AM  Result Value Ref Range   Sodium 136 135 - 145 mmol/L    Comment: ELECTROLYTES REPEATED TO VERIFY DLB   Potassium 4.2 3.5 - 5.1 mmol/L   Chloride 109 98 - 111 mmol/L   CO2 22 22 - 32 mmol/L   Glucose, Bld 109 (H) 70 - 99 mg/dL    Comment: Glucose reference range applies only to samples taken after fasting for at least 8 hours.   BUN 9 8 - 23 mg/dL   Creatinine, Ser 0.70 0.61 - 1.24 mg/dL   Calcium 8.1 (L) 8.9 - 10.3 mg/dL   GFR, Estimated >60 >60 mL/min    Comment: (NOTE) Calculated using the CKD-EPI Creatinine Equation (2021)    Anion gap 5 5 - 15    Comment: Performed at [facility], [address] [place], [place] [address]  CBC     Status: Abnormal   Collection Time: 07/23/22  4:40 AM  Result Value Ref Range   WBC 6.9 4.0 - 10.5 K/uL   RBC 3.37  (L) 4.22 - 5.81 MIL/uL   Hemoglobin 8.3 (L) 13.0 - 17.0 g/dL   HCT 26.9 (L) 39.0 - 52.0 %   MCV 79.8 (L) 80.0 - 100.0 fL   MCH 24.6 (L) 26.0 - 34.0 pg   MCHC 30.9 30.0 - 36.0 g/dL   RDW 21.2 (H) 11.5 - 15.5 %   Platelets 231 150 - 400 K/uL   nRBC 0.6 (H) 0.0 - 0.2 %    Comment: Performed at [facility], [address] [place], [place] [address]  Magnesium     Status: None   Collection Time: 07/23/22  4:40 AM  Result Value Ref Range   Magnesium 1.9 1.7 - 2.4 mg/dL    Comment: Performed at [facility],  [place], [place] [address]  Phosphorus     Status: Abnormal   Collection Time: 07/23/22  4:40 AM  Result Value Ref Range   Phosphorus 2.1 (L) 2.5 - 4.6 mg/dL    Comment: Performed at [facility], [address] [name], [place] [address]  Procalcitonin - Baseline     Status: None   Collection Time: 07/23/22  4:40 AM  Result Value Ref Range   Procalcitonin <0.10 ng/mL    Comment:        Interpretation: PCT (Procalcitonin) <= 0.5 ng/mL: Systemic infection (sepsis) is not likely. Local bacterial infection is possible. (NOTE)       Sepsis PCT Algorithm           Lower Respiratory Tract                                      Infection PCT Algorithm    ----------------------------     ----------------------------         PCT < 0.25 ng/mL                PCT < 0.10 ng/mL          Strongly encourage             Strongly discourage   discontinuation of antibiotics    initiation of antibiotics    ----------------------------     -----------------------------       PCT 0.25 - 0.50 ng/mL            PCT 0.10 - 0.25 ng/mL               OR       >80% decrease in PCT            Discourage initiation of                                            antibiotics      Encourage discontinuation           of antibiotics    ----------------------------     -----------------------------         PCT >= 0.50 ng/mL              PCT 0.26 - 0.50 ng/mL                AND        <80% decrease in PCT             Encourage initiation of                                             antibiotics       Encourage continuation           of antibiotics    ----------------------------     -----------------------------        PCT >= 0.50 ng/mL                  PCT > 0.50 ng/mL               AND         increase in PCT  Strongly encourage                                      initiation of antibiotics    Strongly encourage escalation           of antibiotics                                     -----------------------------                                           PCT <= 0.25 ng/mL                                                 OR                                        > 80% decrease in PCT                                      Discontinue / Do not initiate                                             antibiotics  Performed at [facility], [address] [place], [place] [id number]   Basic metabolic panel     Status: Abnormal   Collection Time: 07/24/22  4:12 AM  Result Value Ref Range   Sodium 131 (L) 135 - 145 mmol/L   Potassium 4.3 3.5 - 5.1 mmol/L   Chloride 102 98 - 111 mmol/L   CO2 23 22 - 32 mmol/L   Glucose, Bld 93 70 - 99 mg/dL    Comment: Glucose reference range applies only to samples taken after fasting for at least 8 hours.   BUN 13 8 - 23 mg/dL   Creatinine, Ser 1.09 0.61 - 1.24 mg/dL   Calcium 8.3 (L) 8.9 - 10.3 mg/dL   GFR, Estimated >60 >60 mL/min    Comment: (NOTE) Calculated using the CKD-EPI Creatinine Equation (2021)    Anion gap 6 5 - 15    Comment: Performed at [facility], [address] [place], [place] [address]  CBC     Status: Abnormal   Collection Time: 07/24/22  4:12 AM  Result Value Ref Range   WBC 7.8 4.0 - 10.5 K/uL   RBC 3.52 (L) 4.22 - 5.81 MIL/uL   Hemoglobin 8.8 (L) 13.0 - 17.0 g/dL   HCT 28.3 (L) 39.0 - 52.0 %   MCV 80.4 80.0 - 100.0 fL   MCH 25.0 (L) 26.0 - 34.0  pg   MCHC 31.1 30.0 - 36.0 g/dL   RDW 22.0 (H) 11.5 - 15.5 %   Platelets 266 150 - 400 K/uL   nRBC 0.6 (H) 0.0 - 0.2 %  Comment: Performed at [facility], [address] [place], [place] [address]  Magnesium     Status: None   Collection Time: 07/24/22  4:12 AM  Result Value Ref Range   Magnesium 1.9 1.7 - 2.4 mg/dL    Comment: Performed at [facility], [address] [place], [place] [address]  Phosphorus     Status: Abnormal   Collection Time: 07/24/22  4:12 AM  Result Value Ref Range   Phosphorus 2.2 (L) 2.5 - 4.6 mg/dL    Comment: Performed at [facility], [address] [place], [place] [address]    Current Facility-Administered Medications  Medication Dose Route Frequency Provider Last Rate Last Admin   acetaminophen (TYLENOL) tablet 650 mg  650 mg Oral Q6H PRN Annita Brod, MD       Or   acetaminophen (TYLENOL) suppository 650 mg  650 mg Rectal Q6H PRN Annita Brod, MD       albuterol (PROVENTIL) (2.5 MG/3ML) 0.083% nebulizer solution 2.5 mg  2.5 mg Nebulization Q2H PRN Annita Brod, MD       ceFEPIme (MAXIPIME) 2 g in sodium chloride 0.9 % 100 mL IVPB  2 g Intravenous Q8H Gevena Barre K, MD 200 mL/hr at 07/24/22 1158 2 g at 07/24/22 1158   enoxaparin (LOVENOX) injection 40 mg  40 mg Subcutaneous Q24H Annita Brod, MD       feeding supplement (ENSURE ENLIVE / ENSURE PLUS) liquid 237 mL  237 mL Oral TID BM Annita Brod, MD   237 mL at 07/24/22 0815   [START ON 07/25/2022] haloperidol decanoate (HALDOL DECANOATE) 100 MG/ML injection 100 mg  100 mg Intramuscular Once Nyal Schachter H, NP       midodrine (PROAMATINE) tablet 10 mg  10 mg Oral TID WC Annita Brod, MD   10 mg at 07/24/22 1157   multivitamin with minerals tablet 1 tablet  1 tablet Oral Daily Annita Brod, MD   1 tablet at 07/24/22 0814   ondansetron (ZOFRAN) tablet 4 mg  4 mg Oral Q6H PRN Annita Brod, MD       Or   ondansetron  (ZOFRAN) injection 4 mg  4 mg Intravenous Q6H PRN Annita Brod, MD       Derrill Memo ON 07/25/2022] paliperidone (INVEGA SUSTENNA) injection 234 mg  234 mg Intramuscular Once Markeisha Mancias H, NP       polyethylene glycol (MIRALAX / GLYCOLAX) packet 17 g  17 g Oral Daily PRN Annita Brod, MD        Musculoskeletal: Strength & Muscle Tone: within normal limits Gait & Station:  Did not observe Patient leans: N/A            Psychiatric Specialty Exam:  Presentation  General Appearance:  Appropriate for Environment  Eye Contact: Good  Speech: Clear and Coherent  Speech Volume: Normal  Handedness: Right   Mood and Affect  Mood: Anxious; Irritable  Affect: Appropriate   Thought Process  Thought Processes: Coherent  Descriptions of Associations:Circumstantial  Orientation:Full (Time, Place and Person)  Thought Content:Delusions  History of Schizophrenia/Schizoaffective disorder:Yes  Duration of Psychotic Symptoms:Greater than six months  Hallucinations:Hallucinations: None  Ideas of Reference:Delusions  Suicidal Thoughts:Suicidal Thoughts: No  Homicidal Thoughts:Homicidal Thoughts: No   Sensorium  Memory: Immediate Fair  Judgment: Fair  Insight: Fair   Executive Functions  Concentration: Fair  Attention Span: Fair  Recall: Good  Fund of Knowledge: Fair  Language: Fair  Psychomotor Activity  Psychomotor Activity:Psychomotor Activity: Normal   Assets  Assets: Communication Skills; Desire for Improvement; Financial Resources/Insurance; Housing; Resilience   Sleep  Sleep:Sleep: Good   Physical Exam: Physical Exam HENT:     Head: Normocephalic and atraumatic.     Nose: Nose normal.  Pulmonary:     Effort: Pulmonary effort is normal.  Musculoskeletal:     Cervical back: Normal range of motion.  Neurological:     Mental Status: He is alert. Mental status is at baseline.    Review of Systems   Psychiatric/Behavioral:  Negative for hallucinations, substance abuse and suicidal ideas.        Delusional thinking    Blood pressure (!) 87/70, pulse (!) 102, temperature 97.8 F (36.6 C), temperature source Oral, resp. rate 18, height 5' 7"$  (1.702 m), weight 61.6 kg, SpO2 99 %. Body mass index is 21.28 kg/m.  Treatment Plan Summary: Patient does not meet criteria for inpatient psychiatric admission. We will order Haldol Decanoate 100 mg and Invega Sustenna 234 mg injections to be given 07/25/2022.  Disposition: No evidence of imminent risk to self or others at present.   Patient does not meet criteria for psychiatric inpatient admission. Supportive therapy provided about ongoing stressors. Continue to follow-up with [name] Team on discharge  Ronny Flurry, [place] 07/24/2022 12:42 PM

## 2022-07-24 NOTE — Progress Notes (Signed)
[facility] Progress Note  Patient: Dustin [name]    [id number]  DOA: 07/20/2022    Date of Service: the patient was seen and examined on 07/24/2022  Brief hospital course: 67 year old male with past medical history of colon cancer, iron deficiency anemia and schizophrenia who presented to the emergency room with weakness after passing out.  Patient stated that for the last 2 days he been dizzy and then found today at his house by brother and caregiver.  EMS was called and initially blood pressure noted to be systolic in the [id number] and hypothermic.  Patient noted to be disheveled with bedbugs present.  Patient deemed contaminated and given IV fluids plus Bair hugger.  Lab work revealed positive for [place].  CT scan noteworthy for groundglass appearance from infection/aspiration in the left lower lobe and lingula.  Patient given IV antibiotics.  Hospitalist were called for further evaluation.   Assessment and Plan: * Schizoaffective disorder, bipolar type ([place]) Does not appear to be on any medications for this.  As needed Haldol for agitation.  Patient with bizarre thought processes.  Psychiatry consulted and saw patient on 2/19.  Patient not felt to meet criteria for inpatient psychiatric admission.  He has weekly Haldol and Invega injections with next 1 scheduled to be given on 2/20.  Will give those here in the hospital.  Patient is also requesting to go to a group home, will discuss with TOC.  We cannot send him directly to a group home from here, but we can help family with information and they can apply placement, which may take up to a month if accepted  Acute metabolic encephalopathy-resolved as of 07/21/2022 Likely secondary to COVID and hypotension.  According to patient's brother, resolved.  COVID-19 virus infection Sepsis ruled out.  Patient not hypoxic.  CRP minimally elevated at 8.5  Hypotension-resolved as of 07/20/2022 Due to dehydration from COVID.  Received aggressive  fluid resuscitation.  Blood pressure improved, but still low at times.  No evidence of worsening sepsis.  In part due to poor p.o. intake.  Dose increased and patient has had some response although still at times with low blood pressure  Aspiration pneumonia ([place]) Noted mildly elevated procalcitonin as well as findings on chest CT.  Patient given 1 dose of vancomycin, Flagyl and cefepime.  Discussed with pharmacy.  Procalcitonin with minimal change from previous day.  Further antibiotics initially held, but then unusual blood cultures for questionable anaerobic gram-positive rods so cefepime continued.  Procalcitonin now normalized.  Continue cefepime until for total of 5 days which ends after 2/19.  Microcytic anemia No evidence of overt obvious blood loss, although with hydration, hemoglobin went from 9.7 On 2/15 to 7.6 the following day.  Initially had concerns about subacute bleed, but since then, subsequent hemoglobins have been on the rise with hemoglobin on 2/19 at 8.8.  Tobacco use disorder Will counsel  Protein-calorie malnutrition, severe Nutrition Status: Nutrition Problem: Severe Malnutrition Etiology: social / environmental circumstances Signs/Symptoms: severe fat depletion, severe muscle depletion   Appreciate nutrition help.  Liberalize diet.  Added supplement shakes.        Body mass index is 21.28 kg/m.  Nutrition Problem: Severe Malnutrition Etiology: social / environmental circumstances     Consultants: None  Procedures: None  Antimicrobials: IV Flagyl, vancomycin x 1 2/15 IV cefepime 2/15-present  Code Status: Full code   Subjective: No complaints, occasionally mumbles  Objective: Vital signs were reviewed and unremarkable. Vitals:   07/24/22 0930 07/24/22 1425  BP: 90/70 90/71  Pulse: 80 77  Resp: 18 18  Temp: (!) 97.1 F (36.2 C) 97.7 F (36.5 C)  SpO2:  100%    Intake/Output Summary (Last 24 hours) at 07/24/2022 1454 Last data filed  at 07/24/2022 0946 Gross per 24 hour  Intake 360 ml  Output 2750 ml  Net -2390 ml   Filed Weights   07/20/22 1425 07/21/22 1636  Weight: 62.7 kg 61.6 kg   Body mass index is 21.28 kg/m.  Exam:  General: Alert and oriented x 2, no acute distress HEENT: Normocephalic, atraumatic, mucous membranes slightly dry Cardiovascular: Regular rate and rhythm, S1-S2 Respiratory: Clear to auscultation bilaterally soft Abdomen:, Nontender, nondistended, positive bowel sounds Musculoskeletal: No clubbing or cyanosis or edema Skin: No skin breaks, tears or lesions Psychiatry: Interactive, but with bizarre thought processes Neurology: No focal deficits  Data Reviewed: Hemoglobin at 8.8  Disposition:  Status is: Inpatient Remains inpatient appropriate because:  -IV psychiatric medication injections scheduled for 2/20    Anticipated discharge date: 2/20 after psychiatric medication injections  Family Communication: Updated brother by phone DVT Prophylaxis: enoxaparin (LOVENOX) injection 40 mg Start: 07/20/22 2200    Author: Annita Brod ,MD 07/24/2022 2:54 PM  To reach On-call, see care teams to locate the attending and reach out via [email or web address]. Between 7PM-7AM, please contact night-coverage If you still have difficulty reaching the attending provider, please page the [facility] (Director on Call) for [facility] on amion for assistance.

## 2022-07-25 DIAGNOSIS — G9341 Metabolic encephalopathy: Secondary | ICD-10-CM | POA: Diagnosis not present

## 2022-07-25 DIAGNOSIS — E43 Unspecified severe protein-calorie malnutrition: Secondary | ICD-10-CM | POA: Diagnosis not present

## 2022-07-25 DIAGNOSIS — F250 Schizoaffective disorder, bipolar type: Secondary | ICD-10-CM | POA: Diagnosis not present

## 2022-07-25 DIAGNOSIS — I959 Hypotension, unspecified: Secondary | ICD-10-CM | POA: Diagnosis not present

## 2022-07-25 LAB — THYROID PANEL WITH TSH
Free Thyroxine Index: 2.7 (ref 1.2–4.9)
T3 Uptake Ratio: 34 % (ref 24–39)
T4, Total: 8 ug/dL (ref 4.5–12.0)
TSH: 1.44 u[IU]/mL (ref 0.450–4.500)

## 2022-07-25 LAB — BASIC METABOLIC PANEL
Anion gap: 3 — ABNORMAL LOW (ref 5–15)
BUN: 17 mg/dL (ref 8–23)
CO2: 25 mmol/L (ref 22–32)
Calcium: 8.3 mg/dL — ABNORMAL LOW (ref 8.9–10.3)
Chloride: 104 mmol/L (ref 98–111)
Creatinine, Ser: 0.82 mg/dL (ref 0.61–1.24)
GFR, Estimated: >60 mL/min (ref >60)
Glucose, Bld: 99 mg/dL (ref 70–99)
Potassium: 4.1 mmol/L (ref 3.5–5.1)
Sodium: 132 mmol/L — ABNORMAL LOW (ref 135–145)

## 2022-07-25 MED ORDER — MIDODRINE HCL 10 MG PO TABS: 15.0000 mg | ORAL_TABLET | Freq: Three times a day (TID) | ORAL | 2 refills | Status: DC

## 2022-07-25 MED ORDER — ADULT MULTIVITAMIN W/MINERALS CH: 1.0000 | ORAL_TABLET | Freq: Every day | ORAL | 1 refills | Status: DC

## 2022-07-25 NOTE — Discharge Summary (Signed)
Physician Discharge Summary   Patient: Dustin [name] MRN: [id number] DOB: 02-12-1956  Admit date:     07/20/2022  Anticipated discharge date: 07/26/2022  Discharge Physician: Annita Brod   PCP: Jonetta Osgood, NP   Recommendations at discharge:   New medication: Multivitamin p.o. daily New medication: Midodrine 15 mg p.o. 3 times daily with meals Patient be discharged in the care of his brother who is working on setting patient up with group home Home health PT  Discharge Diagnoses: Principal Problem:   Schizoaffective disorder, bipolar type ([place]) Active Problems:   Hypotension   Microcytic anemia   Tobacco use disorder   Protein-calorie malnutrition, severe  Resolved Problems:   Acute metabolic encephalopathy  Hospital Course: 67 year old male with past medical history of colon cancer, iron deficiency anemia and schizophrenia who presented to the emergency room with weakness after passing out.  Patient stated that for the last 2 days he been dizzy and then found today at his house by brother and another caregiver.  EMS was called and initially blood pressure noted to be systolic in the [id number] and hypothermic.  Patient noted to be disheveled with bedbugs present.  Patient deemed contaminated and given IV fluids plus Bair hugger.  Lab work revealed positive for [place].  CT scan noteworthy for groundglass appearance from infection/aspiration in the left lower lobe and lingula.  Patient given IV antibiotics.  Hospitalist were called for further evaluation.  Assessment and Plan: * Schizoaffective disorder, bipolar type ([place]) Does not appear to be on any medications for this.  As needed Haldol for agitation.  Patient with bizarre thought processes.  Psychiatry consulted and saw patient on 2/19.  Patient not felt to meet criteria for inpatient psychiatric admission.  He has monthly Haldol and Invega injections with this months doses given to patient on 2/20 during  hospitalization.  Patient is also requesting to go to a group home, will discuss with TOC.  We cannot send him directly to a group home from here, but Family has started the enrollment process.  This may take 2 to 4 weeks, so patient will be discharged on 2/21 to the care of patient's brother.  Acute metabolic encephalopathy-resolved as of 07/21/2022 Likely secondary to COVID and hypotension.  According to patient's brother, resolved.  COVID-19 virus infection Sepsis ruled out.  Patient not hypoxic.  CRP minimally elevated at 8.5  Hypotension Initially thought to be from dehydration from COVID.  Received aggressive fluid resuscitation.  Blood pressure improved, but still low at times.  No evidence of worsening sepsis.  Also in part due to poor p.o. intake.  Patient started on midodrine and titrated upward.  He will continue this medication long-term.  Aspiration pneumonia ([place]) Noted mildly elevated procalcitonin as well as findings on chest CT.  Patient given 1 dose of vancomycin, Flagyl and cefepime.  Discussed with pharmacy.  Procalcitonin with minimal change from previous day.  Further antibiotics initially held, but then unusual blood cultures for questionable anaerobic gram-positive rods so cefepime continued.  Procalcitonin now normalized.  Continue cefepime until for total of 5 days which ends after 2/19.  Microcytic anemia No evidence of overt obvious blood loss, although with hydration, hemoglobin went from 9.7 On 2/15 to 7.6 the following day.  Initially had concerns about subacute bleed, but since then, subsequent hemoglobins have been on the rise with hemoglobin on 2/19 at 8.8.  Tobacco use disorder Will counsel  Protein-calorie malnutrition, severe Nutrition Status: Nutrition Problem: Severe Malnutrition Etiology: social /  environmental circumstances Signs/Symptoms: severe fat depletion, severe muscle depletion   Appreciate nutrition help.  Liberalize diet.  Added  supplement shakes. Patient also put on multivitamin.         Consultants: None Procedures performed: None Disposition: Home with home health, plans to transition patient to group home eventually Diet recommendation:  Regular diet DISCHARGE MEDICATION: Allergies as of 07/25/2022   No Known Allergies      Medication List     TAKE these medications    docusate sodium 100 MG capsule Commonly known as: Colace Take 1 capsule (100 mg total) by mouth daily.   feeding supplement Liqd Take 237 mLs by mouth 3 (three) times daily between meals.   ferrous sulfate 325 (65 FE) MG EC tablet Take 1 tablet (325 mg total) by mouth daily.   haloperidol decanoate 100 MG/ML injection Commonly known as: HALDOL DECANOATE Inject 1 mL (100 mg total) into the muscle once for 1 dose. Next dose due 5/15 What changed:  when to take this additional instructions   Invega Sustenna 234 MG/1.5ML injection Generic drug: paliperidone Inject into the muscle.   midodrine 10 MG tablet Commonly known as: PROAMATINE Take 1.5 tablets (15 mg total) by mouth 3 (three) times daily with meals.   multivitamin with minerals Tabs tablet Take 1 tablet by mouth daily. Start taking on: July 26, 2022        Discharge Exam: Danley Danker Weights   07/20/22 1425 07/21/22 1636  Weight: 62.7 kg 61.6 kg   General: Alert and oriented x 2, no acute distress Cardiovascular: Regular rate and rhythm, S1-S2  Condition at discharge: good  The results of significant diagnostics from this hospitalization (including imaging, microbiology, ancillary and laboratory) are listed below for reference.   Imaging Studies: CT CHEST ABDOMEN PELVIS W CONTRAST  Result Date: 07/20/2022 CLINICAL DATA:  Weakness and confusion, possible syncope, history of colon cancer EXAM: CT CHEST, ABDOMEN, AND PELVIS WITH CONTRAST TECHNIQUE: Multidetector CT imaging of the chest, abdomen and pelvis was performed following the standard protocol  during bolus administration of intravenous contrast. RADIATION DOSE REDUCTION: This exam was performed according to the departmental dose-optimization program which includes automated exposure control, adjustment of the mA and/or kV according to patient size and/or use of iterative reconstruction technique. CONTRAST:  115m OMNIPAQUE IOHEXOL 300 MG/ML  SOLN COMPARISON:  CT chest, abdomen and pelvis dated Oct 09, 2021 FINDINGS: CT CHEST FINDINGS Cardiovascular: Normal heart size. No pericardial effusion. Severe coronary artery calcifications. Normal caliber thoracic aorta with mild atherosclerotic disease. Mediastinum/Nodes: Small hiatal hernia. Thyroid is unremarkable. No pathologically enlarged lymph nodes seen in the chest. Lungs/Pleura: Central airways are patent. Severe centrilobular emphysema. Ground-glass opacities of the left lower lobe and lingula. No pleural effusion or pneumothorax. Musculoskeletal: No chest wall mass or suspicious bone lesions identified. CT ABDOMEN PELVIS FINDINGS Hepatobiliary: No focal liver abnormality is seen. No gallstones, gallbladder wall thickening, or biliary dilatation. Pancreas: Unremarkable. No pancreatic ductal dilatation or surrounding inflammatory changes. Spleen: Normal in size without focal abnormality. Adrenals/Urinary Tract: Bilateral adrenal glands are unremarkable. No hydronephrosis. Bilateral nonobstructing renal stones. Bladder is unremarkable. Stomach/Bowel: Stomach is within normal limits. Prior right hemicolectomy. Sigmoid diverticulosis. No evidence of bowel wall thickening, distention, or inflammatory changes. Vascular/Lymphatic: Infrarenal abdominal aortic aneurysm measuring 3.5 x 3.5 cm. Severe atherosclerotic disease of the abdominal aorta. No pathologically enlarged lymph nodes seen in the chest. Reproductive: Mild prostatomegaly. Other: Nonspecific trace pelvic ascites. No intraperitoneal free air. Musculoskeletal: No acute or significant osseous findings.  IMPRESSION: 1. Ground-glass opacities of the left lower lobe and lingula, findings are likely due to infection or aspiration. 2. No acute findings in the abdomen or pelvis. 3. Nonobstructing bilateral renal calculi. 4. Nonspecific trace pelvic ascites. 5. Infrarenal abdominal aortic aneurysm measuring 3.5 cm, unchanged when compared with the prior exam. Recommend follow-up ultrasound every 2 years. This recommendation follows ACR consensus guidelines: White Paper of the ACR Incidental Findings Committee II on Vascular Findings. J Am Coll Radiol 2013; 10:789-794. 6. Severe coronary artery calcifications. 7. Aortic Atherosclerosis (ICD10-I70.0) and Emphysema (ICD10-J43.9). Electronically Signed   By: Yetta Glassman M.D.   On: 07/20/2022 16:49   CT Cervical Spine Wo Contrast  Result Date: 07/20/2022 CLINICAL DATA:  Trauma EXAM: CT CERVICAL SPINE WITHOUT CONTRAST TECHNIQUE: Multidetector CT imaging of the cervical spine was performed without intravenous contrast. Multiplanar CT image reconstructions were also generated. RADIATION DOSE REDUCTION: This exam was performed according to the departmental dose-optimization program which includes automated exposure control, adjustment of the mA and/or kV according to patient size and/or use of iterative reconstruction technique. COMPARISON:  None Available. FINDINGS: Alignment: Normal. Skull base and vertebrae: No acute fracture. No primary bone lesion or focal pathologic process. Soft tissues and spinal canal: No prevertebral fluid or swelling. No visible canal hematoma. Disc levels: There is moderate disc space narrowing and endplate osteophyte formation at C3-C4, C4-C5 and C5-C6 compatible with degenerative change. There is mild bilateral neural foraminal stenoses at these levels. There is moderate central canal stenosis at C3-C4 and C4-C5 secondary to thickening of the posterior longitudinal ligament and disc bulges. Upper chest: Severe emphysematous changes are  present. There is focal ground-glass opacity in the left upper lobe, indeterminate. Other: None. IMPRESSION: 1. No acute fracture or traumatic subluxation of the cervical spine. 2. Moderate degenerative changes of the cervical spine. 3. Severe emphysema. 4. Focal ground-glass opacity in the left upper lobe, indeterminate. Please correlate with CT chest abdomen and pelvis same day. Emphysema (ICD10-J43.9). Electronically Signed   By: Ronney Asters M.D.   On: 07/20/2022 16:46   CT Head Wo Contrast  Result Date: 07/20/2022 CLINICAL DATA:  Head trauma, minor (Age >= 65y) EXAM: CT HEAD WITHOUT CONTRAST TECHNIQUE: Contiguous axial images were obtained from the base of the skull through the vertex without intravenous contrast. RADIATION DOSE REDUCTION: This exam was performed according to the departmental dose-optimization program which includes automated exposure control, adjustment of the mA and/or kV according to patient size and/or use of iterative reconstruction technique. COMPARISON:  CT head Oct 05, 2021. FINDINGS: Brain: No evidence of acute infarction, hemorrhage, hydrocephalus, extra-axial collection or mass lesion/mass effect. Vascular: No hyperdense vessel. Skull: No acute fracture. Sinuses/Orbits: Partially imaged left maxillary sinus air-fluid level. No acute orbital findings. Other: No mastoid effusions. IMPRESSION: No evidence of acute intracranial abnormality. Electronically Signed   By: Margaretha Sheffield M.D.   On: 07/20/2022 16:27   DG Chest Port 1 View  Result Date: 07/20/2022 CLINICAL DATA:  Sepsis possible syncope EXAM: PORTABLE CHEST 1 VIEW COMPARISON:  10/03/2021, CT 10/09/2021 FINDINGS: Hyperinflation with emphysema. Right lung is grossly clear. Mild heterogeneous ground-glass opacity within the left thorax. No pleural effusion or pneumothorax. Cardiomediastinal silhouette within normal limits. IMPRESSION: Hyperinflation with emphysema. Mild heterogeneous ground-glass opacity within the left  thorax suspicious for pneumonia. Radiographic follow-up to resolution recommended. Electronically Signed   By: Donavan Foil M.D.   On: 07/20/2022 15:10    Microbiology: Results for orders placed or performed during the hospital encounter of 07/20/22  Culture, blood (Routine x 2)     Status: Abnormal   Collection Time: 07/20/22  2:32 PM   Specimen: BLOOD  Result Value Ref Range Status   Specimen Description   Final    BLOOD BLOOD RIGHT ARM BLOOD LEFT ARM Performed at [facility], [address] [place], [place] [address]    Special Requests   Final    BOTTLES DRAWN AEROBIC AND ANAEROBIC Blood Culture results may not be optimal due to an inadequate volume of blood received in culture bottles Performed at [facility], [address] [place], [place] [address]    Culture  Setup Time   Final    Organism ID to follow [place] CRITICAL RESULT CALLED TO, READ BACK BY AND VERIFIED WITH: JASON ROBBINS AT [id number] 07/21/22.PMF Performed at [facility], [address] [place], [place] [address]    Culture (A)  Final    STAPHYLOCOCCUS HOMINIS THE SIGNIFICANCE OF ISOLATING THIS ORGANISM FROM A SINGLE SET OF BLOOD CULTURES WHEN MULTIPLE SETS ARE DRAWN IS UNCERTAIN. PLEASE NOTIFY THE MICROBIOLOGY DEPARTMENT WITHIN ONE WEEK IF SPECIATION AND SENSITIVITIES ARE REQUIRED. Performed at [facility], [place] [address] [place], [place] [address]    Report Status 07/23/2022 FINAL  Final  Culture, blood (Routine x 2)     Status: None (Preliminary result)   Collection Time: 07/20/22  2:32 PM   Specimen: BLOOD  Result Value Ref Range Status   Specimen Description   Final    BLOOD BLOOD RIGHT ARM Performed at [facility], [address] [place], [place] [address]    Special Requests   Final    BOTTLES DRAWN AEROBIC AND ANAEROBIC Blood Culture adequate volume Performed at [facility], [address]  [place], [place] [address]    Culture  Setup Time   Final    GRAM NEGATIVE ORGANISM AEROBIC BOTTLE ONLY CRITICAL RESULT CALLED TO, READ BACK BY AND VERIFIED WITH: TREY GREENWOOD AT [id number] 07/21/22.PMF GRAM POSITIVE RODS AEROBIC BOTTLE ONLY CRITICAL RESULT CALLED TO, READ BACK BY AND VERIFIED WITH: J ROBBINS,PHARMD@[id number]$  07/22/22 [place]    Culture   Final    GRAM NEGATIVE COCCOBACILLI CULTURE REINCUBATED FOR BETTER GROWTH [facility] FOR ID AND SENSITIVITY CULTURE DELAY CALLED TO A PAYTES PHARMD [id number] Performed at [facility], [place] [address] [place], [place] [address]    Report Status PENDING  Incomplete  Blood Culture ID Panel (Reflexed)     Status: Abnormal   Collection Time: 07/20/22  2:32 PM  Result Value Ref Range Status   Enterococcus faecalis NOT DETECTED NOT DETECTED Final   Enterococcus Faecium NOT DETECTED NOT DETECTED Final   Listeria monocytogenes NOT DETECTED NOT DETECTED Final   Staphylococcus species DETECTED (A) NOT DETECTED Final    Comment: CRITICAL RESULT CALLED TO, READ BACK BY AND VERIFIED WITH: JASON ROBBINS AT 0610 07/21/22.PMF    Staphylococcus aureus (BCID) NOT DETECTED NOT DETECTED Final   Staphylococcus epidermidis NOT DETECTED NOT DETECTED Final   Staphylococcus lugdunensis NOT DETECTED NOT DETECTED Final   Streptococcus species NOT DETECTED NOT DETECTED Final   Streptococcus agalactiae NOT DETECTED NOT DETECTED Final   Streptococcus pneumoniae NOT DETECTED NOT DETECTED Final   Streptococcus pyogenes NOT DETECTED NOT DETECTED Final   A.calcoaceticus-baumannii NOT DETECTED NOT DETECTED Final   Bacteroides fragilis NOT DETECTED NOT DETECTED Final   Enterobacterales NOT DETECTED NOT DETECTED Final   Enterobacter cloacae complex NOT DETECTED NOT DETECTED  Final   Escherichia coli NOT DETECTED NOT DETECTED Final   Klebsiella aerogenes NOT DETECTED NOT DETECTED Final   Klebsiella oxytoca NOT DETECTED NOT DETECTED Final   Klebsiella pneumoniae NOT  DETECTED NOT DETECTED Final   Proteus species NOT DETECTED NOT DETECTED Final   Salmonella species NOT DETECTED NOT DETECTED Final   Serratia marcescens NOT DETECTED NOT DETECTED Final   Haemophilus influenzae NOT DETECTED NOT DETECTED Final   Neisseria meningitidis NOT DETECTED NOT DETECTED Final   Pseudomonas aeruginosa NOT DETECTED NOT DETECTED Final   Stenotrophomonas maltophilia NOT DETECTED NOT DETECTED Final   Candida albicans NOT DETECTED NOT DETECTED Final   Candida auris NOT DETECTED NOT DETECTED Final   Candida glabrata NOT DETECTED NOT DETECTED Final   Candida krusei NOT DETECTED NOT DETECTED Final   Candida parapsilosis NOT DETECTED NOT DETECTED Final   Candida tropicalis NOT DETECTED NOT DETECTED Final   Cryptococcus neoformans/gattii NOT DETECTED NOT DETECTED Final    Comment: Performed at [facility], [address] [place], [place] [address]  Blood Culture ID Panel (Reflexed)     Status: None   Collection Time: 07/20/22  2:32 PM  Result Value Ref Range Status   Enterococcus faecalis NOT DETECTED NOT DETECTED Final   Enterococcus Faecium NOT DETECTED NOT DETECTED Final   Listeria monocytogenes NOT DETECTED NOT DETECTED Final   Staphylococcus species NOT DETECTED NOT DETECTED Final   Staphylococcus aureus (BCID) NOT DETECTED NOT DETECTED Final   Staphylococcus epidermidis NOT DETECTED NOT DETECTED Final   Staphylococcus lugdunensis NOT DETECTED NOT DETECTED Final   Streptococcus species NOT DETECTED NOT DETECTED Final   Streptococcus agalactiae NOT DETECTED NOT DETECTED Final   Streptococcus pneumoniae NOT DETECTED NOT DETECTED Final   Streptococcus pyogenes NOT DETECTED NOT DETECTED Final   A.calcoaceticus-baumannii NOT DETECTED NOT DETECTED Final   Bacteroides fragilis NOT DETECTED NOT DETECTED Final   Enterobacterales NOT DETECTED NOT DETECTED Final   Enterobacter cloacae complex NOT DETECTED NOT DETECTED Final   Escherichia coli NOT DETECTED NOT  DETECTED Final   Klebsiella aerogenes NOT DETECTED NOT DETECTED Final   Klebsiella oxytoca NOT DETECTED NOT DETECTED Final   Klebsiella pneumoniae NOT DETECTED NOT DETECTED Final   Proteus species NOT DETECTED NOT DETECTED Final   Salmonella species NOT DETECTED NOT DETECTED Final   Serratia marcescens NOT DETECTED NOT DETECTED Final   Haemophilus influenzae NOT DETECTED NOT DETECTED Final   Neisseria meningitidis NOT DETECTED NOT DETECTED Final   Pseudomonas aeruginosa NOT DETECTED NOT DETECTED Final   Stenotrophomonas maltophilia NOT DETECTED NOT DETECTED Final   Candida albicans NOT DETECTED NOT DETECTED Final   Candida auris NOT DETECTED NOT DETECTED Final   Candida glabrata NOT DETECTED NOT DETECTED Final   Candida krusei NOT DETECTED NOT DETECTED Final   Candida parapsilosis NOT DETECTED NOT DETECTED Final   Candida tropicalis NOT DETECTED NOT DETECTED Final   Cryptococcus neoformans/gattii NOT DETECTED NOT DETECTED Final    Comment: Performed at [facility], [address] [place], [place] [address]  Resp panel by RT-PCR (RSV, Flu A&B, Covid) Anterior Nasal Swab     Status: Abnormal   Collection Time: 07/20/22  2:36 PM   Specimen: Anterior Nasal Swab  Result Value Ref Range Status   SARS Coronavirus 2 by RT PCR POSITIVE (A) NEGATIVE Final    Comment: (NOTE) SARS-CoV-2 target nucleic acids are DETECTED.  The SARS-CoV-2 RNA is generally detectable in upper respiratory specimens during the acute phase of infection. Positive results  are indicative of the presence of the identified virus, but do not rule out bacterial infection or co-infection with other pathogens not detected by the test. Clinical correlation with patient history and other diagnostic information is necessary to determine patient infection status. The expected result is Negative.  Fact Sheet for Patients: EntrepreneurPulse.com.au  Fact Sheet for Healthcare  Providers: IncredibleEmployment.be  This test is not yet approved or cleared by the [place] FDA and  has been authorized for detection and/or diagnosis of SARS-CoV-2 by FDA under an Emergency Use Authorization (EUA).  This EUA will remain in effect (meaning this test can be used) for the duration of  the COVID-19 declaration under Section 564(b)(1) of the A ct, 21 U.S.C. section 360bbb-3(b)(1), unless the authorization is terminated or revoked sooner.     Influenza A by PCR NEGATIVE NEGATIVE Final   Influenza B by PCR NEGATIVE NEGATIVE Final    Comment: (NOTE) The Xpert Xpress SARS-CoV-2/FLU/RSV plus assay is intended as an aid in the diagnosis of influenza from Nasopharyngeal swab specimens and should not be used as a sole basis for treatment. Nasal washings and aspirates are unacceptable for Xpert Xpress SARS-CoV-2/FLU/RSV testing.  Fact Sheet for Patients: EntrepreneurPulse.com.au  Fact Sheet for Healthcare Providers: IncredibleEmployment.be  This test is not yet approved or cleared by the [place] FDA and has been authorized for detection and/or diagnosis of SARS-CoV-2 by FDA under an Emergency Use Authorization (EUA). This EUA will remain in effect (meaning this test can be used) for the duration of the COVID-19 declaration under Section 564(b)(1) of the Act, 21 U.S.C. section 360bbb-3(b)(1), unless the authorization is terminated or revoked.     Resp Syncytial Virus by PCR NEGATIVE NEGATIVE Final    Comment: (NOTE) Fact Sheet for Patients: EntrepreneurPulse.com.au  Fact Sheet for Healthcare Providers: IncredibleEmployment.be  This test is not yet approved or cleared by the [place] FDA and has been authorized for detection and/or diagnosis of SARS-CoV-2 by FDA under an Emergency Use Authorization (EUA). This EUA will remain in effect (meaning this test can be  used) for the duration of the COVID-19 declaration under Section 564(b)(1) of the Act, 21 U.S.C. section 360bbb-3(b)(1), unless the authorization is terminated or revoked.  Performed at [facility], [address] [place], [place] [address]     Labs: CBC: Recent Labs  Lab 07/20/22 1432 07/21/22 0355 07/22/22 0407 07/23/22 0440 07/24/22 0412  WBC 7.7 6.8 6.0 6.9 7.8  NEUTROABS 6.4  --   --   --   --   HGB 9.7* 7.6* 8.0* 8.3* 8.8*  HCT 31.7* 24.5* 25.4* 26.9* 28.3*  MCV 82.6 81.1 81.2 79.8* 80.4  PLT 209 219 222 231 [id number]   Basic Metabolic Panel: Recent Labs  Lab 07/21/22 0355 07/22/22 0407 07/23/22 0440 07/24/22 0412 07/25/22 0323  NA 136 135 136 131* 132*  K 3.7 3.4* 4.2 4.3 4.1  CL 110 111 109 102 104  CO2 20* 18* 22 23 25  $ GLUCOSE 111* 114* 109* 93 99  BUN 15 8 9 13 17  $ CREATININE 0.80 0.80 0.70 1.09 0.82  CALCIUM 8.1* 7.8* 8.1* 8.3* 8.3*  MG  --  1.9 1.9 1.9  --   PHOS  --  1.8* 2.1* 2.2*  --    Liver Function Tests: Recent Labs  Lab 07/20/22 1432 07/21/22 0355  AST 27 15  ALT 18 13  ALKPHOS 62 50  BILITOT 2.0* 1.1  PROT 7.2 5.7*  ALBUMIN 3.1* 2.4*   CBG:  No results for input(s): "GLUCAP" in the last 168 hours.  Discharge time spent: less than 30 minutes.  Signed: Annita Brod, MD [facility] 07/25/2022

## 2022-07-25 NOTE — Plan of Care (Signed)
  Problem: Education: Goal: Knowledge of General Education information will improve Description: Including pain rating scale, medication(s)/side effects and non-pharmacologic comfort measures 07/25/2022 0548 by Bonner Puna, RN Outcome: Progressing 07/25/2022 0546 by Bonner Puna, RN Outcome: Progressing   Problem: Health Behavior/Discharge Planning: Goal: Ability to manage health-related needs will improve 07/25/2022 0548 by Bonner Puna, RN Outcome: Progressing 07/25/2022 0546 by Bonner Puna, RN Outcome: Progressing   Problem: Clinical Measurements: Goal: Ability to maintain clinical measurements within normal limits will improve 07/25/2022 0548 by Bonner Puna, RN Outcome: Progressing 07/25/2022 0546 by Bonner Puna, RN Outcome: Progressing Goal: Will remain free from infection 07/25/2022 0548 by Bonner Puna, RN Outcome: Progressing 07/25/2022 0546 by Bonner Puna, RN Outcome: Progressing Goal: Diagnostic test results will improve 07/25/2022 0548 by Bonner Puna, RN Outcome: Progressing 07/25/2022 0546 by Bonner Puna, RN Outcome: Progressing Goal: Respiratory complications will improve 07/25/2022 0548 by Bonner Puna, RN Outcome: Progressing 07/25/2022 0546 by Bonner Puna, RN Outcome: Progressing Goal: Cardiovascular complication will be avoided 07/25/2022 0548 by Bonner Puna, RN Outcome: Progressing 07/25/2022 0546 by Bonner Puna, RN Outcome: Progressing   Problem: Activity: Goal: Risk for activity intolerance will decrease 07/25/2022 0548 by Bonner Puna, RN Outcome: Progressing 07/25/2022 0546 by Bonner Puna, RN Outcome: Progressing

## 2022-07-25 NOTE — Progress Notes (Signed)
Mobility Specialist - Progress Note   07/25/22 0900  Mobility  Activity Transferred from bed to chair  Level of Assistance Standby assist, set-up cues, supervision of patient - no hands on  Assistive Device None  Distance Ambulated (ft) 2 ft  Activity Response Tolerated well  $Mobility charge 1 Mobility     Pt lying in bed upon arrival, utilizing RA. Pt agreeable to chair transfer for breakfast. Completed bed mobility independently with HOB raised. SPT and STS with minG and use of momentum. Pt left in chair with alarm set, needs in reach.    Kathee Delton Mobility Specialist 07/25/22, 9:08 AM

## 2022-07-25 NOTE — Plan of Care (Signed)

## 2022-07-26 DIAGNOSIS — F250 Schizoaffective disorder, bipolar type: Secondary | ICD-10-CM | POA: Diagnosis not present

## 2022-07-26 LAB — MISC LABCORP TEST (SEND OUT)
LabCorp test name: 182261
LabCorp test name: 182261
Labcorp test code: 182261
Labcorp test code: 182261

## 2022-07-26 NOTE — Progress Notes (Signed)
Discharge instructions were reviewed with pt's bother ([place] Murphy). Questions were encourage and answered. Belongings were collected by RN/Tech and given to family. IV was taken out by main RN

## 2022-07-26 NOTE — NC FL2 (Signed)
  [place] LEVEL OF CARE FORM     IDENTIFICATION  Patient Name: Dustin [name] Birthdate: [birth year] Sex: male Admission Date (Current Location): 07/20/2022  [facility] and [place] Number:  Engineering geologist and Address:         Provider Number: [id number]  Attending Physician Name and Address:  Emeterio Reeve, DO  Relative Name and Phone Number:  Serafina Royals Muegge  Brother  [phone]  Legal guardian    Current Level of Care: Hospital Recommended Level of Care: Family Care Home Prior Approval Number:    Date Approved/Denied:   PASRR Number:    Discharge Plan: Other (Comment) (Family care home)    Current Diagnoses: Patient Active Problem List   Diagnosis Date Noted   Protein-calorie malnutrition, severe 07/22/2022   COVID-19 virus infection 07/20/2022   Hypotension 07/20/2022   Aspiration pneumonia ([place]) 07/20/2022   Adenocarcinoma of colon ([place]) 12/13/2021   Malignant neoplasm of ascending colon ([place]) 11/08/2021   Family history of cancer 11/08/2021   IDA (iron deficiency anemia)    Colonic mass 10/06/2021   Microcytic anemia 10/05/2021   Syncope 10/05/2021   Symptomatic anemia    Schizoaffective disorder, bipolar type ([place]) 01/24/2017   Tobacco use disorder 11/29/2016    Orientation RESPIRATION BLADDER Height & Weight     Self, Place  Normal Incontinent Weight: 61.6 kg Height:  5' 7"$  (170.2 cm)  BEHAVIORAL SYMPTOMS/MOOD NEUROLOGICAL BOWEL NUTRITION STATUS      Incontinent Diet (Regular)  AMBULATORY STATUS COMMUNICATION OF NEEDS Skin   Limited Assist Verbally Skin abrasions                       Personal Care Assistance Level of Assistance              Functional Limitations Info             SPECIAL CARE FACTORS FREQUENCY                       Contractures Contractures Info: Not present    Additional Factors Info  Code Status, Allergies, Isolation Precautions Code Status Info: Full Allergies Info:  NKDA     Isolation Precautions Info: Covid positive 07/20/22     TAKE these medications     docusate sodium 100 MG capsule Commonly known as: Colace Take 1 capsule (100 mg total) by mouth daily.    feeding supplement Liqd Take 237 mLs by mouth 3 (three) times daily between meals.    ferrous sulfate 325 (65 FE) MG EC tablet Take 1 tablet (325 mg total) by mouth daily.    haloperidol decanoate 100 MG/ML injection Commonly known as: HALDOL DECANOATE Inject 1 mL (100 mg total) into the muscle once for 1 dose. Next dose due 5/15 What changed:  when to take this additional instructions    Invega Sustenna 234 MG/1.5ML injection Generic drug: paliperidone Inject into the muscle.    midodrine 10 MG tablet Commonly known as: PROAMATINE Take 1.5 tablets (15 mg total) by mouth 3 (three) times daily with meals.    multivitamin with minerals Tabs tablet Take 1 tablet by mouth daily.     Relevant Imaging Results:  Relevant Lab Results:   Additional Information    [name], RN

## 2022-07-26 NOTE — TOC Transition Note (Addendum)
Transition of Care [facility]) - CM/SW Discharge Note   Patient Details  Name: Natnael Eppinger MRN: [id number] Date of Birth: 1956/04/22  [facility] CM/SW Contact:  [name], RN Phone Number: 07/26/2022, 11:14 AM   Clinical Narrative:    Patient to discharge today Spoke with patients brother (guardian) [name] at length yesterday about discharge plans.   Brother states that he is working with the Materials engineer to assist with group home placement Brother to pick up patient today and take him back to patients trailer (noted to have bugs) while he continues to work on placement  Unable to secure home health services due to infestation of home. Brother notified   Brother confirms that patient has rw and bsc in the home  Spoke with [facility] who owns local group homes  He does have 2 male beds available.  He is agreeable to work with patients brother post discharge to admit patient to one of his facilities.  Provided brother with [facility] Crome direct contact information. Brother in agreement for me to send [place] and dec summary to [place].  Secure Email sent to [facility] with information.   Brother is ware that he will have to make an appointment at [facility] to apply for special assistance medicaid before [facility] can admit           Patient Goals and CMS Choice      Discharge Placement                         Discharge Plan and Services Additional resources added to the After Visit Summary for                                       Social Determinants of Health (SDOH) Interventions [place]: High Risk (07/21/2022)  Alcohol Screen: Low Risk  (03/06/2022)  Depression (PHQ2-9): Low Risk  (03/06/2022)  Tobacco Use: High Risk (07/20/2022)     Readmission Risk Interventions     No data to display

## 2022-07-26 NOTE — Discharge Summary (Signed)
Physician Discharge Summary   Patient: Dustin [name] MRN: [id number] DOB: May 17, 1956  Admit date:     07/20/2022  Discharge date: 07/26/2022  Discharge Physician: Emeterio Reeve   PCP: Jonetta Osgood, NP   Recommendations at discharge:   New medication: Multivitamin p.o. daily New medication: Midodrine 15 mg p.o. 3 times daily with meals Patient be discharged in the care of his brother who is working on setting patient up with group home - confirmed plan w/ brother this morning 07/26/22 10:50 AM  Home health PT  Discharge Diagnoses: Principal Problem:   Schizoaffective disorder, bipolar type ([place]) Active Problems:   Hypotension   Microcytic anemia   Tobacco use disorder   Protein-calorie malnutrition, severe  Resolved Problems:   Acute metabolic encephalopathy  Hospital Course: 67 year old male with past medical history of colon cancer, iron deficiency anemia and schizophrenia who presented to the emergency room with weakness after passing out.  Patient stated that for the last 2 days he been dizzy and then found today at his house by brother and another caregiver.  EMS was called and initially blood pressure noted to be systolic in the [id number] and hypothermic.  Patient noted to be disheveled with bedbugs present.  Patient deemed contaminated and given IV fluids plus Bair hugger.  Lab work revealed positive for [place].  CT scan noteworthy for groundglass appearance from infection/aspiration in the left lower lobe and lingula.  Patient given IV antibiotics.  Hospitalist were called for further evaluation.  Assessment and Plan: * Schizoaffective disorder, bipolar type ([place]) Does not appear to be on any medications for this.  As needed Haldol for agitation.  Patient with bizarre thought processes.  Psychiatry consulted and saw patient on 2/19.  Patient not felt to meet criteria for inpatient psychiatric admission.  He has monthly Haldol and Invega injections with this months doses  given to patient on 2/20 during hospitalization.  Patient is also requesting to go to a group home, will discuss with TOC.  We cannot send him directly to a group home from here, but Family has started the enrollment process.  This may take 2 to 4 weeks, so patient will be discharged on 2/21 to the care of patient's brother.  Acute metabolic encephalopathy-resolved as of 07/21/2022 Likely secondary to COVID and hypotension.  According to patient's brother, resolved.  COVID-19 virus infection Sepsis ruled out.  Patient not hypoxic.  CRP minimally elevated at 8.5  Hypotension Initially thought to be from dehydration from COVID.  Received aggressive fluid resuscitation.  Blood pressure improved, but still low at times.  No evidence of worsening sepsis.  Also in part due to poor p.o. intake.  Patient started on midodrine and titrated upward.  He will continue this medication long-term.  Aspiration pneumonia ([place]) Noted mildly elevated procalcitonin as well as findings on chest CT.  Patient given 1 dose of vancomycin, Flagyl and cefepime.  Discussed with pharmacy.  Procalcitonin with minimal change from previous day.  Further antibiotics initially held, but then unusual blood cultures for questionable anaerobic gram-positive rods so cefepime continued.  Procalcitonin now normalized.  Continue cefepime until for total of 5 days which ends after 2/19.  Microcytic anemia No evidence of overt obvious blood loss, although with hydration, hemoglobin went from 9.7 On 2/15 to 7.6 the following day.  Initially had concerns about subacute bleed, but since then, subsequent hemoglobins have been on the rise with hemoglobin on 2/19 at 8.8.  Tobacco use disorder Will counsel  Protein-calorie malnutrition, severe  Nutrition Status: Nutrition Problem: Severe Malnutrition Etiology: social / environmental circumstances Signs/Symptoms: severe fat depletion, severe muscle depletion   Appreciate nutrition help.   Liberalize diet.  Added supplement shakes. Patient also put on multivitamin.         Consultants: None Procedures performed: None Disposition: Home with home health, plans to transition patient to group home eventually Diet recommendation:  Regular diet DISCHARGE MEDICATION: Allergies as of 07/26/2022   No Known Allergies      Medication List     TAKE these medications    docusate sodium 100 MG capsule Commonly known as: Colace Take 1 capsule (100 mg total) by mouth daily.   feeding supplement Liqd Take 237 mLs by mouth 3 (three) times daily between meals.   ferrous sulfate 325 (65 FE) MG EC tablet Take 1 tablet (325 mg total) by mouth daily.   haloperidol decanoate 100 MG/ML injection Commonly known as: HALDOL DECANOATE Inject 1 mL (100 mg total) into the muscle once for 1 dose. Next dose due 5/15 What changed:  when to take this additional instructions   Invega Sustenna 234 MG/1.5ML injection Generic drug: paliperidone Inject into the muscle.   midodrine 10 MG tablet Commonly known as: PROAMATINE Take 1.5 tablets (15 mg total) by mouth 3 (three) times daily with meals.   multivitamin with minerals Tabs tablet Take 1 tablet by mouth daily.        Discharge Exam: Filed Weights   07/20/22 1425 07/21/22 1636  Weight: 62.7 kg 61.6 kg   General: Alert and oriented x 2, no acute distress Cardiovascular: Regular rate and rhythm, S1-S2 Lungs: CTABL  Condition at discharge: good  The results of significant diagnostics from this hospitalization (including imaging, microbiology, ancillary and laboratory) are listed below for reference.   Imaging Studies: CT CHEST ABDOMEN PELVIS W CONTRAST  Result Date: 07/20/2022 CLINICAL DATA:  Weakness and confusion, possible syncope, history of colon cancer EXAM: CT CHEST, ABDOMEN, AND PELVIS WITH CONTRAST TECHNIQUE: Multidetector CT imaging of the chest, abdomen and pelvis was performed following the standard protocol  during bolus administration of intravenous contrast. RADIATION DOSE REDUCTION: This exam was performed according to the departmental dose-optimization program which includes automated exposure control, adjustment of the mA and/or kV according to patient size and/or use of iterative reconstruction technique. CONTRAST:  147m OMNIPAQUE IOHEXOL 300 MG/ML  SOLN COMPARISON:  CT chest, abdomen and pelvis dated Oct 09, 2021 FINDINGS: CT CHEST FINDINGS Cardiovascular: Normal heart size. No pericardial effusion. Severe coronary artery calcifications. Normal caliber thoracic aorta with mild atherosclerotic disease. Mediastinum/Nodes: Small hiatal hernia. Thyroid is unremarkable. No pathologically enlarged lymph nodes seen in the chest. Lungs/Pleura: Central airways are patent. Severe centrilobular emphysema. Ground-glass opacities of the left lower lobe and lingula. No pleural effusion or pneumothorax. Musculoskeletal: No chest wall mass or suspicious bone lesions identified. CT ABDOMEN PELVIS FINDINGS Hepatobiliary: No focal liver abnormality is seen. No gallstones, gallbladder wall thickening, or biliary dilatation. Pancreas: Unremarkable. No pancreatic ductal dilatation or surrounding inflammatory changes. Spleen: Normal in size without focal abnormality. Adrenals/Urinary Tract: Bilateral adrenal glands are unremarkable. No hydronephrosis. Bilateral nonobstructing renal stones. Bladder is unremarkable. Stomach/Bowel: Stomach is within normal limits. Prior right hemicolectomy. Sigmoid diverticulosis. No evidence of bowel wall thickening, distention, or inflammatory changes. Vascular/Lymphatic: Infrarenal abdominal aortic aneurysm measuring 3.5 x 3.5 cm. Severe atherosclerotic disease of the abdominal aorta. No pathologically enlarged lymph nodes seen in the chest. Reproductive: Mild prostatomegaly. Other: Nonspecific trace pelvic ascites. No intraperitoneal free air. Musculoskeletal: No acute  or significant osseous findings.  IMPRESSION: 1. Ground-glass opacities of the left lower lobe and lingula, findings are likely due to infection or aspiration. 2. No acute findings in the abdomen or pelvis. 3. Nonobstructing bilateral renal calculi. 4. Nonspecific trace pelvic ascites. 5. Infrarenal abdominal aortic aneurysm measuring 3.5 cm, unchanged when compared with the prior exam. Recommend follow-up ultrasound every 2 years. This recommendation follows ACR consensus guidelines: White Paper of the ACR Incidental Findings Committee II on Vascular Findings. J Am Coll Radiol 2013; 10:789-794. 6. Severe coronary artery calcifications. 7. Aortic Atherosclerosis (ICD10-I70.0) and Emphysema (ICD10-J43.9). Electronically Signed   By: Yetta Glassman M.D.   On: 07/20/2022 16:49   CT Cervical Spine Wo Contrast  Result Date: 07/20/2022 CLINICAL DATA:  Trauma EXAM: CT CERVICAL SPINE WITHOUT CONTRAST TECHNIQUE: Multidetector CT imaging of the cervical spine was performed without intravenous contrast. Multiplanar CT image reconstructions were also generated. RADIATION DOSE REDUCTION: This exam was performed according to the departmental dose-optimization program which includes automated exposure control, adjustment of the mA and/or kV according to patient size and/or use of iterative reconstruction technique. COMPARISON:  None Available. FINDINGS: Alignment: Normal. Skull base and vertebrae: No acute fracture. No primary bone lesion or focal pathologic process. Soft tissues and spinal canal: No prevertebral fluid or swelling. No visible canal hematoma. Disc levels: There is moderate disc space narrowing and endplate osteophyte formation at C3-C4, C4-C5 and C5-C6 compatible with degenerative change. There is mild bilateral neural foraminal stenoses at these levels. There is moderate central canal stenosis at C3-C4 and C4-C5 secondary to thickening of the posterior longitudinal ligament and disc bulges. Upper chest: Severe emphysematous changes are  present. There is focal ground-glass opacity in the left upper lobe, indeterminate. Other: None. IMPRESSION: 1. No acute fracture or traumatic subluxation of the cervical spine. 2. Moderate degenerative changes of the cervical spine. 3. Severe emphysema. 4. Focal ground-glass opacity in the left upper lobe, indeterminate. Please correlate with CT chest abdomen and pelvis same day. Emphysema (ICD10-J43.9). Electronically Signed   By: Ronney Asters M.D.   On: 07/20/2022 16:46   CT Head Wo Contrast  Result Date: 07/20/2022 CLINICAL DATA:  Head trauma, minor (Age >= 65y) EXAM: CT HEAD WITHOUT CONTRAST TECHNIQUE: Contiguous axial images were obtained from the base of the skull through the vertex without intravenous contrast. RADIATION DOSE REDUCTION: This exam was performed according to the departmental dose-optimization program which includes automated exposure control, adjustment of the mA and/or kV according to patient size and/or use of iterative reconstruction technique. COMPARISON:  CT head Oct 05, 2021. FINDINGS: Brain: No evidence of acute infarction, hemorrhage, hydrocephalus, extra-axial collection or mass lesion/mass effect. Vascular: No hyperdense vessel. Skull: No acute fracture. Sinuses/Orbits: Partially imaged left maxillary sinus air-fluid level. No acute orbital findings. Other: No mastoid effusions. IMPRESSION: No evidence of acute intracranial abnormality. Electronically Signed   By: Margaretha Sheffield M.D.   On: 07/20/2022 16:27   DG Chest Port 1 View  Result Date: 07/20/2022 CLINICAL DATA:  Sepsis possible syncope EXAM: PORTABLE CHEST 1 VIEW COMPARISON:  10/03/2021, CT 10/09/2021 FINDINGS: Hyperinflation with emphysema. Right lung is grossly clear. Mild heterogeneous ground-glass opacity within the left thorax. No pleural effusion or pneumothorax. Cardiomediastinal silhouette within normal limits. IMPRESSION: Hyperinflation with emphysema. Mild heterogeneous ground-glass opacity within the left  thorax suspicious for pneumonia. Radiographic follow-up to resolution recommended. Electronically Signed   By: Donavan Foil M.D.   On: 07/20/2022 15:10    Microbiology: Results for orders placed or performed during  the hospital encounter of 07/20/22  Culture, blood (Routine x 2)     Status: Abnormal   Collection Time: 07/20/22  2:32 PM   Specimen: BLOOD  Result Value Ref Range Status   Specimen Description   Final    BLOOD BLOOD RIGHT ARM BLOOD LEFT ARM Performed at [facility], [address], [place], [place] [address]    Special Requests   Final    BOTTLES DRAWN AEROBIC AND ANAEROBIC Blood Culture results may not be optimal due to an inadequate volume of blood received in culture bottles Performed at [facility], [address] [place], [place] [address]    Culture  Setup Time   Final    Organism ID to follow [place] CRITICAL RESULT CALLED TO, READ BACK BY AND VERIFIED WITH: JASON ROBBINS AT [id number] 07/21/22.PMF Performed at [facility], [address] [place], [place] [address]    Culture (A)  Final    STAPHYLOCOCCUS HOMINIS THE SIGNIFICANCE OF ISOLATING THIS ORGANISM FROM A SINGLE SET OF BLOOD CULTURES WHEN MULTIPLE SETS ARE DRAWN IS UNCERTAIN. PLEASE NOTIFY THE MICROBIOLOGY DEPARTMENT WITHIN ONE WEEK IF SPECIATION AND SENSITIVITIES ARE REQUIRED. Performed at [facility], [place] [address] [place], [place] [address]    Report Status 07/23/2022 FINAL  Final  Culture, blood (Routine x 2)     Status: None (Preliminary result)   Collection Time: 07/20/22  2:32 PM   Specimen: BLOOD  Result Value Ref Range Status   Specimen Description   Final    BLOOD BLOOD RIGHT ARM Performed at [facility], [address], [place], [place] [address]    Special Requests   Final    BOTTLES DRAWN AEROBIC AND ANAEROBIC Blood Culture adequate volume Performed at [facility], [address]  [place], [place] [address]    Culture  Setup Time   Final    GRAM NEGATIVE ORGANISM AEROBIC BOTTLE ONLY CRITICAL RESULT CALLED TO, READ BACK BY AND VERIFIED WITH: TREY GREENWOOD AT [id number] 07/21/22.PMF GRAM POSITIVE RODS AEROBIC BOTTLE ONLY CRITICAL RESULT CALLED TO, READ BACK BY AND VERIFIED WITH: J ROBBINS,PHARMD@[id number]$  07/22/22 [place]    Culture   Final    GRAM NEGATIVE COCCOBACILLI CULTURE REINCUBATED FOR BETTER GROWTH [employer] FOR ID AND SENSITIVITY CULTURE DELAY CALLED TO A PAYTES PHARMD [id number] Performed at [facility], [place] [address], [place], [place] [address]    Report Status PENDING  Incomplete  Blood Culture ID Panel (Reflexed)     Status: Abnormal   Collection Time: 07/20/22  2:32 PM  Result Value Ref Range Status   Enterococcus faecalis NOT DETECTED NOT DETECTED Final   Enterococcus Faecium NOT DETECTED NOT DETECTED Final   Listeria monocytogenes NOT DETECTED NOT DETECTED Final   Staphylococcus species DETECTED (A) NOT DETECTED Final    Comment: CRITICAL RESULT CALLED TO, READ BACK BY AND VERIFIED WITH: JASON ROBBINS AT 0610 07/21/22.PMF    Staphylococcus aureus (BCID) NOT DETECTED NOT DETECTED Final   Staphylococcus epidermidis NOT DETECTED NOT DETECTED Final   Staphylococcus lugdunensis NOT DETECTED NOT DETECTED Final   Streptococcus species NOT DETECTED NOT DETECTED Final   Streptococcus agalactiae NOT DETECTED NOT DETECTED Final   Streptococcus pneumoniae NOT DETECTED NOT DETECTED Final   Streptococcus pyogenes NOT DETECTED NOT DETECTED Final   A.calcoaceticus-baumannii NOT DETECTED NOT DETECTED Final   Bacteroides fragilis NOT DETECTED NOT DETECTED Final   Enterobacterales NOT DETECTED NOT DETECTED Final   Enterobacter  cloacae complex NOT DETECTED NOT DETECTED Final   Escherichia coli NOT DETECTED NOT DETECTED Final   Klebsiella aerogenes NOT DETECTED NOT DETECTED Final   Klebsiella oxytoca NOT DETECTED NOT DETECTED Final   Klebsiella pneumoniae NOT  DETECTED NOT DETECTED Final   Proteus species NOT DETECTED NOT DETECTED Final   Salmonella species NOT DETECTED NOT DETECTED Final   Serratia marcescens NOT DETECTED NOT DETECTED Final   Haemophilus influenzae NOT DETECTED NOT DETECTED Final   Neisseria meningitidis NOT DETECTED NOT DETECTED Final   Pseudomonas aeruginosa NOT DETECTED NOT DETECTED Final   Stenotrophomonas maltophilia NOT DETECTED NOT DETECTED Final   Candida albicans NOT DETECTED NOT DETECTED Final   Candida auris NOT DETECTED NOT DETECTED Final   Candida glabrata NOT DETECTED NOT DETECTED Final   Candida krusei NOT DETECTED NOT DETECTED Final   Candida parapsilosis NOT DETECTED NOT DETECTED Final   Candida tropicalis NOT DETECTED NOT DETECTED Final   Cryptococcus neoformans/gattii NOT DETECTED NOT DETECTED Final    Comment: Performed at [facility], [address] [place], [place] [address]  Blood Culture ID Panel (Reflexed)     Status: None   Collection Time: 07/20/22  2:32 PM  Result Value Ref Range Status   Enterococcus faecalis NOT DETECTED NOT DETECTED Final   Enterococcus Faecium NOT DETECTED NOT DETECTED Final   Listeria monocytogenes NOT DETECTED NOT DETECTED Final   Staphylococcus species NOT DETECTED NOT DETECTED Final   Staphylococcus aureus (BCID) NOT DETECTED NOT DETECTED Final   Staphylococcus epidermidis NOT DETECTED NOT DETECTED Final   Staphylococcus lugdunensis NOT DETECTED NOT DETECTED Final   Streptococcus species NOT DETECTED NOT DETECTED Final   Streptococcus agalactiae NOT DETECTED NOT DETECTED Final   Streptococcus pneumoniae NOT DETECTED NOT DETECTED Final   Streptococcus pyogenes NOT DETECTED NOT DETECTED Final   A.calcoaceticus-baumannii NOT DETECTED NOT DETECTED Final   Bacteroides fragilis NOT DETECTED NOT DETECTED Final   Enterobacterales NOT DETECTED NOT DETECTED Final   Enterobacter cloacae complex NOT DETECTED NOT DETECTED Final   Escherichia coli NOT DETECTED NOT  DETECTED Final   Klebsiella aerogenes NOT DETECTED NOT DETECTED Final   Klebsiella oxytoca NOT DETECTED NOT DETECTED Final   Klebsiella pneumoniae NOT DETECTED NOT DETECTED Final   Proteus species NOT DETECTED NOT DETECTED Final   Salmonella species NOT DETECTED NOT DETECTED Final   Serratia marcescens NOT DETECTED NOT DETECTED Final   Haemophilus influenzae NOT DETECTED NOT DETECTED Final   Neisseria meningitidis NOT DETECTED NOT DETECTED Final   Pseudomonas aeruginosa NOT DETECTED NOT DETECTED Final   Stenotrophomonas maltophilia NOT DETECTED NOT DETECTED Final   Candida albicans NOT DETECTED NOT DETECTED Final   Candida auris NOT DETECTED NOT DETECTED Final   Candida glabrata NOT DETECTED NOT DETECTED Final   Candida krusei NOT DETECTED NOT DETECTED Final   Candida parapsilosis NOT DETECTED NOT DETECTED Final   Candida tropicalis NOT DETECTED NOT DETECTED Final   Cryptococcus neoformans/gattii NOT DETECTED NOT DETECTED Final    Comment: Performed at [facility], [address] [place],  [address]  Resp panel by RT-PCR (RSV, Flu A&B, Covid) Anterior Nasal Swab     Status: Abnormal   Collection Time: 07/20/22  2:36 PM   Specimen: Anterior Nasal Swab  Result Value Ref Range Status   SARS Coronavirus 2 by RT PCR POSITIVE (A) NEGATIVE Final    Comment: (NOTE) SARS-CoV-2 target nucleic acids are DETECTED.  The SARS-CoV-2 RNA is generally detectable in upper respiratory specimens during the  acute phase of infection. Positive results are indicative of the presence of the identified virus, but do not rule out bacterial infection or co-infection with other pathogens not detected by the test. Clinical correlation with patient history and other diagnostic information is necessary to determine patient infection status. The expected result is Negative.  Fact Sheet for Patients: EntrepreneurPulse.com.au  Fact Sheet for Healthcare  Providers: IncredibleEmployment.be  This test is not yet approved or cleared by the [place] FDA and  has been authorized for detection and/or diagnosis of SARS-CoV-2 by FDA under an Emergency Use Authorization (EUA).  This EUA will remain in effect (meaning this test can be used) for the duration of  the COVID-19 declaration under Section 564(b)(1) of the A ct, 21 U.S.C. section 360bbb-3(b)(1), unless the authorization is terminated or revoked sooner.     Influenza A by PCR NEGATIVE NEGATIVE Final   Influenza B by PCR NEGATIVE NEGATIVE Final    Comment: (NOTE) The Xpert Xpress SARS-CoV-2/FLU/RSV plus assay is intended as an aid in the diagnosis of influenza from Nasopharyngeal swab specimens and should not be used as a sole basis for treatment. Nasal washings and aspirates are unacceptable for Xpert Xpress SARS-CoV-2/FLU/RSV testing.  Fact Sheet for Patients: EntrepreneurPulse.com.au  Fact Sheet for Healthcare Providers: IncredibleEmployment.be  This test is not yet approved or cleared by the [place] FDA and has been authorized for detection and/or diagnosis of SARS-CoV-2 by FDA under an Emergency Use Authorization (EUA). This EUA will remain in effect (meaning this test can be used) for the duration of the COVID-19 declaration under Section 564(b)(1) of the Act, 21 U.S.C. section 360bbb-3(b)(1), unless the authorization is terminated or revoked.     Resp Syncytial Virus by PCR NEGATIVE NEGATIVE Final    Comment: (NOTE) Fact Sheet for Patients: EntrepreneurPulse.com.au  Fact Sheet for Healthcare Providers: IncredibleEmployment.be  This test is not yet approved or cleared by the [place] FDA and has been authorized for detection and/or diagnosis of SARS-CoV-2 by FDA under an Emergency Use Authorization (EUA). This EUA will remain in effect (meaning this test can be  used) for the duration of the COVID-19 declaration under Section 564(b)(1) of the Act, 21 U.S.C. section 360bbb-3(b)(1), unless the authorization is terminated or revoked.  Performed at [facility], [address] [place], [place] [address]     Labs: CBC: Recent Labs  Lab 07/20/22 1432 07/21/22 0355 07/22/22 0407 07/23/22 0440 07/24/22 0412  WBC 7.7 6.8 6.0 6.9 7.8  NEUTROABS 6.4  --   --   --   --   HGB 9.7* 7.6* 8.0* 8.3* 8.8*  HCT 31.7* 24.5* 25.4* 26.9* 28.3*  MCV 82.6 81.1 81.2 79.8* 80.4  PLT 209 219 222 231 [id number]    Basic Metabolic Panel: Recent Labs  Lab 07/21/22 0355 07/22/22 0407 07/23/22 0440 07/24/22 0412 07/25/22 0323  NA 136 135 136 131* 132*  K 3.7 3.4* 4.2 4.3 4.1  CL 110 111 109 102 104  CO2 20* 18* 22 23 25  $ GLUCOSE 111* 114* 109* 93 99  BUN 15 8 9 13 17  $ CREATININE 0.80 0.80 0.70 1.09 0.82  CALCIUM 8.1* 7.8* 8.1* 8.3* 8.3*  MG  --  1.9 1.9 1.9  --   PHOS  --  1.8* 2.1* 2.2*  --     Liver Function Tests: Recent Labs  Lab 07/20/22 1432 07/21/22 0355  AST 27 15  ALT 18 13  ALKPHOS 62 50  BILITOT 2.0* 1.1  PROT 7.2  5.7*  ALBUMIN 3.1* 2.4*    CBG: No results for input(s): "GLUCAP" in the last 168 hours.  Discharge time spent: less than 30 minutes.  Signed: Emeterio Reeve, DO [facility] 07/26/2022

## 2022-07-26 NOTE — Progress Notes (Signed)
Pt's brother called to get clothes and to discuss discharge information. Pt iv removed no complications at this time

## 2022-07-26 NOTE — Care Management Important Message (Signed)
Important Message  Patient Details  Name: Dustin [name] MRN: [id number] Date of Birth: 1956-05-05   Medicare Important Message Given:  Yes  Reviewed Medicare IM with Serafina Royals Boylen, legal guardian, at [phone].  Copy of Medicare IM placed in mail to home address on file.    Dannette Barbara 07/26/2022, 11:58 AM

## 2022-07-28 ENCOUNTER — Encounter: Payer: Self-pay | Admitting: Oncology

## 2022-07-30 ENCOUNTER — Encounter: Payer: Self-pay | Admitting: Oncology

## 2022-08-01 ENCOUNTER — Telehealth: Payer: 59 | Admitting: Internal Medicine

## 2022-08-01 ENCOUNTER — Encounter: Payer: Self-pay | Admitting: Internal Medicine

## 2022-08-01 DIAGNOSIS — U071 COVID-19: Secondary | ICD-10-CM

## 2022-08-01 DIAGNOSIS — F209 Schizophrenia, unspecified: Secondary | ICD-10-CM | POA: Diagnosis not present

## 2022-08-01 DIAGNOSIS — Z09 Encounter for follow-up examination after completed treatment for conditions other than malignant neoplasm: Secondary | ICD-10-CM

## 2022-08-01 DIAGNOSIS — I959 Hypotension, unspecified: Secondary | ICD-10-CM

## 2022-08-01 NOTE — Progress Notes (Signed)
[facility] [place], [place] [address]  Internal MEDICINE  Telephone Visit    Transitional care management ( date of admission 07/20/2022)                                                                                        ( Date of Discharge 07/26/2022)  Patient Name: Dustin [name]  [id number]  [id number]  Date of Service: 08/01/2022  I connected with the patient at 64 am  by telephone and verified the patients identity using two identifiers.   I discussed the limitations, risks, security and privacy concerns of performing an evaluation and management service by telephone and the availability of in person appointments. I also discussed with the patient that there may be a patient responsible charge related to the service.  The patient expressed understanding and agrees to proceed.    Chief Complaint  Patient presents with   Telephone Assessment   Telephone [facility] follow up Covid  and sepsis     HPI  Pt is connected via video visit staying in hotel along with his brother Pt is 67 year old male with multiple medical problems including schizoaffective disorder He is with past medical history of colon cancer, iron deficiency anemia and schizophrenia who presented to the emergency room with weakness after passing out. Patient stated that for the last 2 days he been dizzy and then found today at his house by brother and another caregiver. EMS was called and initially blood pressure noted to be systolic in the [id number] and hypothermic. Patient noted to be disheveled with bedbugs present. Patient deemed contaminated and given IV fluids plus Bair hugger. Lab work revealed positive for [place]. CT scan noteworthy for groundglass appearance from infection/aspiration in the left lower lobe and lingula. Patient given IV antibiotics.     Patient was admitted with a diagnosis of COVID-19 viral infection, acute metabolic encephalopathy, hypertension, aspiration  pneumonia, and microcytic anemia patient was also found to be malnourished Current Medication: Outpatient Encounter Medications as of 08/01/2022  Medication Sig Note   docusate sodium (COLACE) 100 MG capsule Take 1 capsule (100 mg total) by mouth daily.    feeding supplement (ENSURE ENLIVE / ENSURE PLUS) LIQD Take 237 mLs by mouth 3 (three) times daily between meals.    ferrous sulfate 325 (65 FE) MG EC tablet Take 1 tablet (325 mg total) by mouth daily.    INVEGA SUSTENNA 234 MG/1.5ML injection Inject into the muscle. 07/20/2022: According to Patient's sister-in-law, [name], he does take BOTH injections monthly. He does receive both injections on the same day. Mrs Alleva said he had in January 2024, just not sure what day. It was filled 06/23/2022.   midodrine (PROAMATINE) 10 MG tablet Take 1.5 tablets (15 mg total) by mouth 3 (three) times daily with meals.    Multiple Vitamin (MULTIVITAMIN WITH MINERALS) TABS tablet Take 1 tablet by mouth daily.    haloperidol decanoate (HALDOL DECANOATE) 100 MG/ML injection Inject 1 mL (100 mg total) into the muscle once for 1 dose. Next dose due 5/15 (Patient taking differently: Inject 100 mg into the muscle every 28 (twenty-eight)  days.) 07/20/2022: According to Patient's sister-in-law, [name], he does take BOTH injections monthly. [employer] administers in his home monthly. He does receive both injections on the same day. Mrs. Winne said he had in January 2024, just not dure what day. It was filled 06/23/2022.   No facility-administered encounter medications on file as of 08/01/2022.    Surgical History: Past Surgical History:  Procedure Laterality Date   COLONOSCOPY N/A 10/06/2021   Procedure: COLONOSCOPY;  Surgeon: Toledo, Benay Pike, MD;  Location: [facility] ENDOSCOPY;  Service: Gastroenterology;  Laterality: N/A;   ESOPHAGOGASTRODUODENOSCOPY N/A 10/06/2021   Procedure: ESOPHAGOGASTRODUODENOSCOPY (EGD);  Surgeon: Toledo, Benay Pike, MD;  Location: [facility] ENDOSCOPY;   Service: Gastroenterology;  Laterality: N/A;   PARTIAL COLECTOMY  10/13/2021   Procedure: RIGHT COLECTOMY;  Surgeon: Jules Husbands, MD;  Location: [facility] ORS;  Service: General;;    Medical History: Past Medical History:  Diagnosis Date   Schizophrenia ([place])     Family History: Family History  Problem Relation Age of Onset   Lung cancer Father    Hypertension Brother    Hypertension Brother    Hypertension Brother    Prostate cancer Brother     Social History   Socioeconomic History   Marital status: Single    Spouse name: Not on file   Number of children: Not on file   Years of education: Not on file   Highest education level: Not on file  Occupational History   Not on file  Tobacco Use   Smoking status: Every Day    Packs/day: 0.50    Years: 15.00    Total pack years: 7.50    Types: Cigarettes   Smokeless tobacco: Never  Vaping Use   Vaping Use: Never used  Substance and Sexual Activity   Alcohol use: Not Currently   Drug use: No   Sexual activity: Never    Birth control/protection: None  Other Topics Concern   Not on file  Social History Narrative   Not on file   Social Determinants of Health   Financial Resource Strain: Not on file  Food Insecurity: Not on file  Transportation Needs: Not on file  Physical Activity: Not on file  Stress: Not on file  Social Connections: Not on file  Intimate Partner Violence: Not At Risk (07/21/2022)   Humiliation, Afraid, Rape, and Kick questionnaire    Fear of Current or Ex-Partner: No    Emotionally Abused: No    Physically Abused: No    Sexually Abused: No      Review of Systems  Constitutional:  Negative for fatigue and fever.  HENT:  Negative for congestion, mouth sores and postnasal drip.   Respiratory:  Negative for cough.   Cardiovascular:  Negative for chest pain.  Genitourinary:  Negative for flank pain.  Psychiatric/Behavioral: Negative.      Vital Signs: There were no vitals taken for this  visit.   Observation/Objective: Patient is sitting with his brother in a hotel room, trying to get into assisted living    Assessment/Plan: 1. COVID-19 virus infection Patient is recovering, trying to get into assisted living per brother  2. Hospital discharge follow-up Medications were reviewed patient might need rehab  3. Hypotension, unspecified hypotension type This is resolved patient is on midodrine for hypotension   4. Schizophrenia, unspecified type [facility]) Patient will need psychiatric appointment once illness is living  General Counseling: yobani schertzer understanding of the findings of today's phone visit and agrees with plan of treatment.  I have discussed any further diagnostic evaluation that may be needed or ordered today. We also reviewed his medications today. he has been encouraged to call the office with any questions or concerns that should arise related to todays visit.    No orders of the defined types were placed in this encounter.   No orders of the defined types were placed in this encounter.   Time spent:45 Minutes    Dr Lavera Guise Internal medicine

## 2022-08-15 DIAGNOSIS — F17200 Nicotine dependence, unspecified, uncomplicated: Secondary | ICD-10-CM | POA: Diagnosis not present

## 2022-08-15 DIAGNOSIS — Z79899 Other long term (current) drug therapy: Secondary | ICD-10-CM | POA: Diagnosis not present

## 2022-08-15 DIAGNOSIS — F17290 Nicotine dependence, other tobacco product, uncomplicated: Secondary | ICD-10-CM | POA: Diagnosis not present

## 2022-08-16 DIAGNOSIS — I10 Essential (primary) hypertension: Secondary | ICD-10-CM | POA: Diagnosis not present

## 2022-08-18 LAB — AEROBIC BACTERIA, ID BY SEQ.

## 2022-08-21 DIAGNOSIS — Z79899 Other long term (current) drug therapy: Secondary | ICD-10-CM | POA: Diagnosis not present

## 2022-08-22 ENCOUNTER — Ambulatory Visit: Admission: RE | Admit: 2022-08-22 | Payer: 59 | Source: Home / Self Care

## 2022-08-22 ENCOUNTER — Encounter: Admission: RE | Payer: Self-pay | Source: Home / Self Care

## 2022-08-22 LAB — ORGANISM ID BY SEQUENCING

## 2022-08-22 LAB — SUSCEPTIBILITY, GRAM POS RODS

## 2022-08-22 SURGERY — COLONOSCOPY WITH PROPOFOL
Anesthesia: General

## 2022-08-24 LAB — CULTURE, BLOOD (ROUTINE X 2): Special Requests: ADEQUATE

## 2022-09-07 ENCOUNTER — Ambulatory Visit: Payer: 59 | Admitting: Nurse Practitioner

## 2022-09-18 DIAGNOSIS — Z79899 Other long term (current) drug therapy: Secondary | ICD-10-CM | POA: Diagnosis not present

## 2022-09-18 DIAGNOSIS — I10 Essential (primary) hypertension: Secondary | ICD-10-CM | POA: Diagnosis not present

## 2022-09-18 DIAGNOSIS — F17200 Nicotine dependence, unspecified, uncomplicated: Secondary | ICD-10-CM | POA: Diagnosis not present

## 2022-09-18 DIAGNOSIS — F17290 Nicotine dependence, other tobacco product, uncomplicated: Secondary | ICD-10-CM | POA: Diagnosis not present

## 2022-10-03 ENCOUNTER — Inpatient Hospital Stay: Payer: 59 | Attending: Oncology

## 2022-10-06 ENCOUNTER — Telehealth: Payer: Self-pay | Admitting: Oncology

## 2022-10-06 NOTE — Telephone Encounter (Signed)
Called to reschedule labs that pt missed. Spoke to his sister in Social worker. She said that patient lives in a group home now and they have their own doctors and so he will not be coming back here.

## 2022-10-09 ENCOUNTER — Inpatient Hospital Stay: Payer: 59 | Admitting: Oncology

## 2022-10-09 ENCOUNTER — Inpatient Hospital Stay: Payer: 59

## 2022-10-16 DIAGNOSIS — E874 Mixed disorder of acid-base balance: Secondary | ICD-10-CM | POA: Diagnosis not present

## 2022-10-16 DIAGNOSIS — N390 Urinary tract infection, site not specified: Secondary | ICD-10-CM | POA: Diagnosis not present

## 2022-10-16 DIAGNOSIS — F17210 Nicotine dependence, cigarettes, uncomplicated: Secondary | ICD-10-CM | POA: Diagnosis not present

## 2022-10-16 DIAGNOSIS — Z1152 Encounter for screening for COVID-19: Secondary | ICD-10-CM | POA: Diagnosis not present

## 2022-10-16 DIAGNOSIS — Z91199 Patient's noncompliance with other medical treatment and regimen due to unspecified reason: Secondary | ICD-10-CM | POA: Diagnosis not present

## 2022-10-16 DIAGNOSIS — R6889 Other general symptoms and signs: Secondary | ICD-10-CM | POA: Diagnosis not present

## 2022-10-16 DIAGNOSIS — R531 Weakness: Secondary | ICD-10-CM | POA: Diagnosis not present

## 2022-10-16 DIAGNOSIS — E2740 Unspecified adrenocortical insufficiency: Secondary | ICD-10-CM | POA: Diagnosis not present

## 2022-10-16 DIAGNOSIS — R001 Bradycardia, unspecified: Secondary | ICD-10-CM | POA: Diagnosis not present

## 2022-10-16 DIAGNOSIS — I499 Cardiac arrhythmia, unspecified: Secondary | ICD-10-CM | POA: Diagnosis not present

## 2022-10-16 DIAGNOSIS — Z7401 Bed confinement status: Secondary | ICD-10-CM | POA: Diagnosis not present

## 2022-10-16 DIAGNOSIS — T68XXXS Hypothermia, sequela: Secondary | ICD-10-CM | POA: Diagnosis not present

## 2022-10-16 DIAGNOSIS — Z79899 Other long term (current) drug therapy: Secondary | ICD-10-CM | POA: Diagnosis not present

## 2022-10-16 DIAGNOSIS — R404 Transient alteration of awareness: Secondary | ICD-10-CM | POA: Diagnosis not present

## 2022-10-16 DIAGNOSIS — J439 Emphysema, unspecified: Secondary | ICD-10-CM | POA: Diagnosis not present

## 2022-10-16 DIAGNOSIS — I959 Hypotension, unspecified: Secondary | ICD-10-CM | POA: Diagnosis not present

## 2022-10-16 DIAGNOSIS — I440 Atrioventricular block, first degree: Secondary | ICD-10-CM | POA: Diagnosis not present

## 2022-10-16 DIAGNOSIS — A419 Sepsis, unspecified organism: Secondary | ICD-10-CM | POA: Diagnosis not present

## 2022-10-16 DIAGNOSIS — R6521 Severe sepsis with septic shock: Secondary | ICD-10-CM | POA: Diagnosis not present

## 2022-10-16 DIAGNOSIS — D696 Thrombocytopenia, unspecified: Secondary | ICD-10-CM | POA: Diagnosis not present

## 2022-10-16 DIAGNOSIS — T68XXXA Hypothermia, initial encounter: Secondary | ICD-10-CM | POA: Diagnosis not present

## 2022-10-16 DIAGNOSIS — Z743 Need for continuous supervision: Secondary | ICD-10-CM | POA: Diagnosis not present

## 2022-10-16 DIAGNOSIS — R2689 Other abnormalities of gait and mobility: Secondary | ICD-10-CM | POA: Diagnosis not present

## 2022-10-23 DIAGNOSIS — F17200 Nicotine dependence, unspecified, uncomplicated: Secondary | ICD-10-CM | POA: Diagnosis not present

## 2022-11-06 ENCOUNTER — Other Ambulatory Visit (HOSPITAL_COMMUNITY): Payer: Self-pay | Admitting: Psychiatry

## 2022-11-07 DIAGNOSIS — E8881 Metabolic syndrome: Secondary | ICD-10-CM | POA: Diagnosis not present

## 2022-11-10 ENCOUNTER — Other Ambulatory Visit: Payer: Self-pay

## 2022-11-10 ENCOUNTER — Inpatient Hospital Stay (HOSPITAL_COMMUNITY)
Admission: EM | Admit: 2022-11-10 | Discharge: 2022-11-23 | DRG: 872 | Disposition: A | Discharge status: Skilled Nursing Facility | Payer: 59 | Attending: Internal Medicine | Admitting: Internal Medicine

## 2022-11-10 ENCOUNTER — Emergency Department (HOSPITAL_COMMUNITY): Payer: 59

## 2022-11-10 ENCOUNTER — Encounter (HOSPITAL_COMMUNITY): Payer: Self-pay | Admitting: *Deleted

## 2022-11-10 ENCOUNTER — Inpatient Hospital Stay (HOSPITAL_COMMUNITY): Payer: 59

## 2022-11-10 DIAGNOSIS — R609 Edema, unspecified: Secondary | ICD-10-CM | POA: Diagnosis not present

## 2022-11-10 DIAGNOSIS — F250 Schizoaffective disorder, bipolar type: Secondary | ICD-10-CM | POA: Diagnosis present

## 2022-11-10 DIAGNOSIS — R7881 Bacteremia: Secondary | ICD-10-CM | POA: Diagnosis present

## 2022-11-10 DIAGNOSIS — Z043 Encounter for examination and observation following other accident: Secondary | ICD-10-CM | POA: Diagnosis not present

## 2022-11-10 DIAGNOSIS — Z8249 Family history of ischemic heart disease and other diseases of the circulatory system: Secondary | ICD-10-CM | POA: Diagnosis not present

## 2022-11-10 DIAGNOSIS — A419 Sepsis, unspecified organism: Secondary | ICD-10-CM | POA: Diagnosis present

## 2022-11-10 DIAGNOSIS — E119 Type 2 diabetes mellitus without complications: Secondary | ICD-10-CM | POA: Diagnosis not present

## 2022-11-10 DIAGNOSIS — F411 Generalized anxiety disorder: Secondary | ICD-10-CM | POA: Diagnosis present

## 2022-11-10 DIAGNOSIS — M6281 Muscle weakness (generalized): Secondary | ICD-10-CM | POA: Diagnosis not present

## 2022-11-10 DIAGNOSIS — R5381 Other malaise: Secondary | ICD-10-CM | POA: Diagnosis present

## 2022-11-10 DIAGNOSIS — M25511 Pain in right shoulder: Secondary | ICD-10-CM | POA: Diagnosis present

## 2022-11-10 DIAGNOSIS — R55 Syncope and collapse: Secondary | ICD-10-CM | POA: Diagnosis present

## 2022-11-10 DIAGNOSIS — D696 Thrombocytopenia, unspecified: Secondary | ICD-10-CM | POA: Diagnosis present

## 2022-11-10 DIAGNOSIS — Z743 Need for continuous supervision: Secondary | ICD-10-CM | POA: Diagnosis not present

## 2022-11-10 DIAGNOSIS — R531 Weakness: Secondary | ICD-10-CM | POA: Diagnosis not present

## 2022-11-10 DIAGNOSIS — W1830XA Fall on same level, unspecified, initial encounter: Secondary | ICD-10-CM | POA: Diagnosis present

## 2022-11-10 DIAGNOSIS — R2681 Unsteadiness on feet: Secondary | ICD-10-CM | POA: Diagnosis not present

## 2022-11-10 DIAGNOSIS — R680 Hypothermia, not associated with low environmental temperature: Secondary | ICD-10-CM | POA: Diagnosis not present

## 2022-11-10 DIAGNOSIS — Z85038 Personal history of other malignant neoplasm of large intestine: Secondary | ICD-10-CM

## 2022-11-10 DIAGNOSIS — L89222 Pressure ulcer of left hip, stage 2: Secondary | ICD-10-CM | POA: Diagnosis not present

## 2022-11-10 DIAGNOSIS — I10 Essential (primary) hypertension: Secondary | ICD-10-CM | POA: Diagnosis not present

## 2022-11-10 DIAGNOSIS — Z801 Family history of malignant neoplasm of trachea, bronchus and lung: Secondary | ICD-10-CM | POA: Diagnosis not present

## 2022-11-10 DIAGNOSIS — I440 Atrioventricular block, first degree: Secondary | ICD-10-CM | POA: Diagnosis present

## 2022-11-10 DIAGNOSIS — Z8042 Family history of malignant neoplasm of prostate: Secondary | ICD-10-CM

## 2022-11-10 DIAGNOSIS — R6520 Severe sepsis without septic shock: Secondary | ICD-10-CM | POA: Diagnosis present

## 2022-11-10 DIAGNOSIS — D72819 Decreased white blood cell count, unspecified: Secondary | ICD-10-CM | POA: Diagnosis not present

## 2022-11-10 DIAGNOSIS — R001 Bradycardia, unspecified: Secondary | ICD-10-CM | POA: Diagnosis not present

## 2022-11-10 DIAGNOSIS — L8990 Pressure ulcer of unspecified site, unspecified stage: Secondary | ICD-10-CM | POA: Diagnosis present

## 2022-11-10 DIAGNOSIS — L89152 Pressure ulcer of sacral region, stage 2: Secondary | ICD-10-CM | POA: Diagnosis not present

## 2022-11-10 DIAGNOSIS — T68XXXA Hypothermia, initial encounter: Principal | ICD-10-CM

## 2022-11-10 DIAGNOSIS — S79911A Unspecified injury of right hip, initial encounter: Secondary | ICD-10-CM | POA: Diagnosis not present

## 2022-11-10 DIAGNOSIS — E11649 Type 2 diabetes mellitus with hypoglycemia without coma: Secondary | ICD-10-CM | POA: Diagnosis not present

## 2022-11-10 DIAGNOSIS — R262 Difficulty in walking, not elsewhere classified: Secondary | ICD-10-CM | POA: Diagnosis not present

## 2022-11-10 DIAGNOSIS — Z79899 Other long term (current) drug therapy: Secondary | ICD-10-CM

## 2022-11-10 DIAGNOSIS — D649 Anemia, unspecified: Secondary | ICD-10-CM | POA: Diagnosis not present

## 2022-11-10 DIAGNOSIS — A4151 Sepsis due to Escherichia coli [E. coli]: Secondary | ICD-10-CM | POA: Diagnosis present

## 2022-11-10 DIAGNOSIS — F17210 Nicotine dependence, cigarettes, uncomplicated: Secondary | ICD-10-CM | POA: Diagnosis present

## 2022-11-10 DIAGNOSIS — M19011 Primary osteoarthritis, right shoulder: Secondary | ICD-10-CM | POA: Diagnosis not present

## 2022-11-10 DIAGNOSIS — Z9181 History of falling: Secondary | ICD-10-CM | POA: Diagnosis not present

## 2022-11-10 DIAGNOSIS — C182 Malignant neoplasm of ascending colon: Secondary | ICD-10-CM | POA: Diagnosis present

## 2022-11-10 DIAGNOSIS — I9589 Other hypotension: Secondary | ICD-10-CM | POA: Diagnosis not present

## 2022-11-10 DIAGNOSIS — Z9049 Acquired absence of other specified parts of digestive tract: Secondary | ICD-10-CM | POA: Diagnosis not present

## 2022-11-10 DIAGNOSIS — M25551 Pain in right hip: Secondary | ICD-10-CM | POA: Diagnosis not present

## 2022-11-10 DIAGNOSIS — Z7401 Bed confinement status: Secondary | ICD-10-CM | POA: Diagnosis not present

## 2022-11-10 DIAGNOSIS — H9190 Unspecified hearing loss, unspecified ear: Secondary | ICD-10-CM | POA: Diagnosis present

## 2022-11-10 DIAGNOSIS — R0689 Other abnormalities of breathing: Secondary | ICD-10-CM | POA: Diagnosis not present

## 2022-11-10 DIAGNOSIS — I959 Hypotension, unspecified: Secondary | ICD-10-CM | POA: Diagnosis present

## 2022-11-10 DIAGNOSIS — F17200 Nicotine dependence, unspecified, uncomplicated: Secondary | ICD-10-CM | POA: Diagnosis not present

## 2022-11-10 DIAGNOSIS — R451 Restlessness and agitation: Secondary | ICD-10-CM | POA: Diagnosis not present

## 2022-11-10 DIAGNOSIS — I499 Cardiac arrhythmia, unspecified: Secondary | ICD-10-CM | POA: Diagnosis not present

## 2022-11-10 DIAGNOSIS — R5383 Other fatigue: Secondary | ICD-10-CM | POA: Diagnosis present

## 2022-11-10 DIAGNOSIS — N390 Urinary tract infection, site not specified: Secondary | ICD-10-CM | POA: Diagnosis not present

## 2022-11-10 DIAGNOSIS — F209 Schizophrenia, unspecified: Secondary | ICD-10-CM | POA: Diagnosis not present

## 2022-11-10 LAB — COMPREHENSIVE METABOLIC PANEL
ALT: 24 U/L (ref 0–44)
AST: 22 U/L (ref 15–41)
Albumin: 3 g/dL — ABNORMAL LOW (ref 3.5–5.0)
Alkaline Phosphatase: 125 U/L (ref 38–126)
Anion gap: 7 (ref 5–15)
BUN: 15 mg/dL (ref 8–23)
CO2: 27 mmol/L (ref 22–32)
Calcium: 9.6 mg/dL (ref 8.9–10.3)
Chloride: 107 mmol/L (ref 98–111)
Creatinine, Ser: 1 mg/dL (ref 0.61–1.24)
GFR, Estimated: >60 mL/min (ref >60)
Glucose, Bld: 69 mg/dL — ABNORMAL LOW (ref 70–99)
Potassium: 4.2 mmol/L (ref 3.5–5.1)
Sodium: 141 mmol/L (ref 135–145)
Total Bilirubin: 0.6 mg/dL (ref 0.3–1.2)
Total Protein: 6.2 g/dL — ABNORMAL LOW (ref 6.5–8.1)

## 2022-11-10 LAB — CBC WITH DIFFERENTIAL/PLATELET
Abs Immature Granulocytes: 0 10*3/uL (ref 0.00–0.07)
Basophils Absolute: 0 10*3/uL (ref 0.0–0.1)
Basophils Relative: 0 %
Eosinophils Absolute: 0 10*3/uL (ref 0.0–0.5)
Eosinophils Relative: 1 %
HCT: 40 % (ref 39.0–52.0)
Hemoglobin: 12.5 g/dL — ABNORMAL LOW (ref 13.0–17.0)
Immature Granulocytes: 0 %
Lymphocytes Relative: 14 %
Lymphs Abs: 0.4 10*3/uL — ABNORMAL LOW (ref 0.7–4.0)
MCH: 26.5 pg (ref 26.0–34.0)
MCHC: 31.3 g/dL (ref 30.0–36.0)
MCV: 84.7 fL (ref 80.0–100.0)
Monocytes Absolute: 0.3 10*3/uL (ref 0.1–1.0)
Monocytes Relative: 10 %
Neutro Abs: 2.4 10*3/uL (ref 1.7–7.7)
Neutrophils Relative %: 75 %
Platelets: 51 10*3/uL — ABNORMAL LOW (ref 150–400)
RBC: 4.72 MIL/uL (ref 4.22–5.81)
RDW: 20.9 % — ABNORMAL HIGH (ref 11.5–15.5)
WBC: 3.1 10*3/uL — ABNORMAL LOW (ref 4.0–10.5)
nRBC: 0 % (ref 0.0–0.2)

## 2022-11-10 LAB — URINALYSIS, W/ REFLEX TO CULTURE (INFECTION SUSPECTED)
Bacteria, UA: NONE SEEN
Bilirubin Urine: NEGATIVE
Glucose, UA: NEGATIVE mg/dL
Hgb urine dipstick: NEGATIVE
Ketones, ur: NEGATIVE mg/dL
Nitrite: POSITIVE — AB
Protein, ur: NEGATIVE mg/dL
Specific Gravity, Urine: 1.012 (ref 1.005–1.030)
pH: 7 (ref 5.0–8.0)

## 2022-11-10 LAB — PROTIME-INR
INR: 1.1 (ref 0.8–1.2)
Prothrombin Time: 14.2 seconds (ref 11.4–15.2)

## 2022-11-10 LAB — MAGNESIUM
Magnesium: 1.8 mg/dL (ref 1.7–2.4)
Magnesium: 1.8 mg/dL (ref 1.7–2.4)

## 2022-11-10 LAB — HIV ANTIBODY (ROUTINE TESTING W REFLEX): HIV Screen 4th Generation wRfx: NONREACTIVE

## 2022-11-10 LAB — LACTIC ACID, PLASMA
Lactic Acid, Venous: 0.7 mmol/L (ref 0.5–1.9)
Lactic Acid, Venous: 0.6 mmol/L (ref 0.5–1.9)

## 2022-11-10 LAB — APTT: aPTT: 45 seconds — ABNORMAL HIGH (ref 24–36)

## 2022-11-10 LAB — MRSA NEXT GEN BY PCR, NASAL: MRSA by PCR Next Gen: NOT DETECTED

## 2022-11-10 LAB — PHOSPHORUS: Phosphorus: 2.7 mg/dL (ref 2.5–4.6)

## 2022-11-10 LAB — TSH: TSH: 1.59 u[IU]/mL (ref 0.350–4.500)

## 2022-11-10 MED ORDER — SODIUM CHLORIDE 0.9 % IV BOLUS
500.0000 mL | Freq: Once | INTRAVENOUS | Status: AC
  Administered 2022-11-10: 500 mL via INTRAVENOUS

## 2022-11-10 MED ORDER — ATROPINE SULFATE 1 MG/10ML IJ SOSY
PREFILLED_SYRINGE | INTRAMUSCULAR | Status: AC
  Filled 2022-11-10: qty 10

## 2022-11-10 MED ORDER — LACTATED RINGERS IV BOLUS (SEPSIS)
500.0000 mL | Freq: Once | INTRAVENOUS | Status: AC
  Administered 2022-11-10: 500 mL via INTRAVENOUS

## 2022-11-10 MED ORDER — NICOTINE 14 MG/24HR TD PT24
14.0000 mg | MEDICATED_PATCH | Freq: Every day | TRANSDERMAL | Status: DC
  Administered 2022-11-10 – 2022-11-23 (×14): 14 mg via TRANSDERMAL
  Filled 2022-11-10 (×15): qty 1

## 2022-11-10 MED ORDER — SODIUM CHLORIDE 0.9 % IV SOLN
1.0000 g | Freq: Once | INTRAVENOUS | Status: AC
  Administered 2022-11-10: 1 g via INTRAVENOUS
  Filled 2022-11-10: qty 10

## 2022-11-10 MED ORDER — SODIUM CHLORIDE 0.9 % IV SOLN: INTRAVENOUS | Status: DC

## 2022-11-10 MED ORDER — ONDANSETRON HCL 4 MG PO TABS: 4.0000 mg | ORAL_TABLET | Freq: Four times a day (QID) | ORAL | Status: DC | PRN

## 2022-11-10 MED ORDER — ACETAMINOPHEN 650 MG RE SUPP: 650.0000 mg | Freq: Four times a day (QID) | RECTAL | Status: DC | PRN

## 2022-11-10 MED ORDER — ONDANSETRON HCL 4 MG/2ML IJ SOLN: 4.0000 mg | Freq: Four times a day (QID) | INTRAMUSCULAR | Status: DC | PRN

## 2022-11-10 MED ORDER — CHLORHEXIDINE GLUCONATE CLOTH 2 % EX PADS
6.0000 | MEDICATED_PAD | Freq: Every day | CUTANEOUS | Status: DC
  Administered 2022-11-10 – 2022-11-23 (×14): 6 via TOPICAL

## 2022-11-10 MED ORDER — HYDROCORTISONE SOD SUC (PF) 100 MG IJ SOLR
100.0000 mg | Freq: Once | INTRAMUSCULAR | Status: AC
  Administered 2022-11-10: 100 mg via INTRAVENOUS
  Filled 2022-11-10: qty 2

## 2022-11-10 MED ORDER — SODIUM CHLORIDE 0.9 % IV SOLN
2.0000 g | INTRAVENOUS | Status: DC
  Administered 2022-11-11 – 2022-11-16 (×6): 2 g via INTRAVENOUS
  Filled 2022-11-10 (×6): qty 20

## 2022-11-10 MED ORDER — RISPERIDONE 1 MG PO TABS
0.5000 mg | ORAL_TABLET | Freq: Two times a day (BID) | ORAL | Status: DC
  Administered 2022-11-10 – 2022-11-15 (×7): 0.5 mg via ORAL
  Filled 2022-11-10 (×9): qty 1

## 2022-11-10 MED ORDER — ATROPINE SULFATE 1 MG/ML IV SOLN
INTRAVENOUS | Status: AC
  Filled 2022-11-10: qty 1

## 2022-11-10 MED ORDER — ACETAMINOPHEN 325 MG PO TABS
650.0000 mg | ORAL_TABLET | Freq: Four times a day (QID) | ORAL | Status: DC | PRN
  Filled 2022-11-10: qty 2

## 2022-11-10 NOTE — ED Notes (Signed)
Pt voided in urinal with RN help.  Urine sent to lab

## 2022-11-10 NOTE — Assessment & Plan Note (Signed)
-  Continue outpatient follow-up with oncology service -Per records patient is due for colonoscopy evaluation at the end of June. -Stable hemoglobin appreciated.

## 2022-11-10 NOTE — Assessment & Plan Note (Signed)
-  Cessation counseling provided -Nicotine patch has been ordered. 

## 2022-11-10 NOTE — ED Notes (Signed)
Please note that pt was placed on bair hugger on high and defib pads were placed on pt.  EDP aware

## 2022-11-10 NOTE — H&P (Signed)
History and Physical    Patient: Dustin [name] ZOX:[id number] DOB: September 25, 1955 DOA: 11/10/2022 DOS: the patient was seen and examined on 11/10/2022 PCP: Sallyanne Kuster, NP  Patient coming from: Home  Chief Complaint:  Chief Complaint  Patient presents with   Urinary Tract Infection   Fall   Bradycardia   HPI: Dustin [name] is a 67 y.o. male with medical history significant of schizophrenia, colon cancer, tobacco abuse and anemia; who presented to the hospital secondary to unwitnessed mechanical fall and generalized weakness.  Workup demonstrated the presence of hypothermia, bradycardia and leukopenia; urinalysis suggesting the presence of UTI.  Patient met criteria for sepsis.  Due to underlying history of schizophrenia patient history noncontributory..  Patient reported no chest pain, no nausea, no vomiting, no abdominal pain, no focal weaknesses.  He expressed pain in his right shoulder and right hip area.  Cultures taken, fluid resuscitation initiated and IV antibiotics started.  Lactic acid within normal limits.  TRH consulted to place patient in the hospital for further evaluation and management.  Review of Systems: As mentioned in the history of present illness. All other systems reviewed and are negative.  Past Medical History:  Diagnosis Date   Schizophrenia [facility])    Past Surgical History:  Procedure Laterality Date   COLONOSCOPY N/A 10/06/2021   Procedure: COLONOSCOPY;  Surgeon: Toledo, Boykin Nearing, MD;  Location: [facility] ENDOSCOPY;  Service: Gastroenterology;  Laterality: N/A;   ESOPHAGOGASTRODUODENOSCOPY N/A 10/06/2021   Procedure: ESOPHAGOGASTRODUODENOSCOPY (EGD);  Surgeon: Toledo, Boykin Nearing, MD;  Location: [facility] ENDOSCOPY;  Service: Gastroenterology;  Laterality: N/A;   PARTIAL COLECTOMY  10/13/2021   Procedure: RIGHT COLECTOMY;  Surgeon: Leafy Ro, MD;  Location: [facility] ORS;  Service: General;;   Social History:  reports that he has been smoking cigarettes. He has a  7.50 pack-year smoking history. He has never used smokeless tobacco. He reports that he does not currently use alcohol. He reports that he does not use drugs.  No Known Allergies  Family History  Problem Relation Age of Onset   Lung cancer Father    Hypertension Brother    Hypertension Brother    Hypertension Brother    Prostate cancer Brother     Prior to Admission medications   Medication Sig Start Date End Date Taking? Authorizing Provider  haloperidol decanoate (HALDOL DECANOATE) 100 MG/ML injection Inject 1 mL (100 mg total) into the muscle once for 1 dose. Next dose due 5/15 Patient taking differently: Inject 100 mg into the muscle every 28 (twenty-eight) days. 07/16/18 11/10/22 Yes [name], Jackquline Denmark, MD  INVEGA SUSTENNA 234 MG/1.5ML injection Inject into the muscle. 12/01/21  Yes [provider]  risperiDONE (RISPERDAL) 0.5 MG tablet Take 0.5 mg by mouth 2 (two) times daily. 10/25/22  Yes [provider]    Physical Exam: Vitals:   11/10/22 1500 11/10/22 1522 11/10/22 1600 11/10/22 1623  BP:  133/79 103/64   Pulse:  (!) 40 (!) 36 (!) 40  Resp:  13 13 (!) 8  Temp: (!) 92.2 F (33.4 C)   (!) 92.9 F (33.8 C)  TempSrc: Rectal   Rectal  SpO2:  97% 96% 95%  Weight:      Height:       General exam: Alert, awake, oriented x x 1 consistently; intermittently able to reports place orientation.  Cold to touch.  And in no distress. Respiratory system: Clear to auscultation. Respiratory effort normal.  Good saturation on room air. Cardiovascular system: Sinus bradycardia, no rubs,  no gallops, no JVD. Gastrointestinal system: Abdomen is nondistended, soft and nontender. No organomegaly or masses felt. Normal bowel sounds heard. Central nervous system: No focal neurological deficits.  Moving 4 limbs spontaneously. Extremities: No cyanosis or clubbing. Skin: No petechiae; left hip stage II pressure injury without signs of superimposed infection. Psychiatry: Judgement and  insight appear impaired secondary to underlying psychiatric disorder.  Data Reviewed: Lactic acid: 0.6 Magnesium: 1.8 TSH: 1.590 Blood culture: Pending CBC: WBC 3.1, hemoglobin 12.5, platelet count 51 K Comprehensive metabolic panel: Sodium 131, potassium 4.2, chloride 107, bicarb 27, glucose 69, BUN 15, creatinine 1.00 50s.   Assessment and Plan: * Sepsis secondary to UTI [facility]) -Patient has met sepsis criteria at time of admission with leukopenia, hypothermia and identified source of infection at his urine. -Cultures has been taking and will follow results. -Fluid resuscitation initiated and patient started on Rocephin 2 g daily. -Bear hugger applied and patient admitted to stepdown unit. -Follow clinical response.  Schizoaffective disorder, bipolar type (HCC) -Patient lives at group home at baseline -Continue Risperdal -Continue supportive care -Resume after discharge the use of monthly Haldol and Invega injections.  Tobacco use disorder -Cessation counseling provided -Nicotine patch has been ordered  Pressure injury of skin -Left hip stage II pressure injury present at time of admission -No signs of superimposed infection -Continue preventive measures and constant repositioning.  Malignant neoplasm of ascending colon [facility]) -Continue outpatient follow-up with oncology service -Per records patient is due for colonoscopy evaluation at the end of June. -Stable hemoglobin appreciated.  Thrombocytopenia: -Appears to be Associated with presence of sepsis -No overt bleeding appreciated -Avoid heparin products -Follow platelet count trend.    Advance Care Planning:   Code Status: Full Code   Consults: none   Family Communication: No family at bedside.  Severity of Illness: The appropriate patient status for this patient is INPATIENT. Inpatient status is judged to be reasonable and necessary in order to provide the required intensity of service to ensure the patient's  safety. The patient's presenting symptoms, physical exam findings, and initial radiographic and laboratory data in the context of their chronic comorbidities is felt to place them at high risk for further clinical deterioration. Furthermore, it is not anticipated that the patient will be medically stable for discharge from the hospital within 2 midnights of admission.   * I certify that at the point of admission it is my clinical judgment that the patient will require inpatient hospital care spanning beyond 2 midnights from the point of admission due to high intensity of service, high risk for further deterioration and high frequency of surveillance required.*  Author: Vassie Loll, MD 11/10/2022 4:32 PM  For on call review [email or web address].

## 2022-11-10 NOTE — ED Notes (Signed)
Pt Legal guardian Dustin [name] called and notified that pt was brought in by EMS from group home.  Guardian is aware of this and gives verbal consent for treatment.

## 2022-11-10 NOTE — ED Triage Notes (Signed)
Pt lives at Group home [phone] and had an unwitnessed fall at 4am, staff did not feel that he was injured but due to hx of UTI and strong urine smell they called ems.  EMS placed 22g IV and gave pt 600cc bolus pta. Per chard pt has a legal guardian.

## 2022-11-10 NOTE — Progress Notes (Signed)
Warming blanket applied to patient

## 2022-11-10 NOTE — Assessment & Plan Note (Signed)
-  Patient lives at group home at baseline -Continue Risperdal -Continue supportive care -Resume after discharge the use of monthly Haldol and Invega injections.

## 2022-11-10 NOTE — ED Provider Notes (Signed)
[place] EMERGENCY DEPARTMENT AT [facility] Provider Note   CSN: [id number] Arrival date & time: 11/10/22  1030     History  Chief Complaint  Patient presents with   Urinary Tract Infection   Fall    Dustin [name] is a 67 y.o. male.  He is brought in by EMS for possible code sepsis.  He reportedly had a fall overnight at his facility and was put back to bed.  This morning is complaining of right shoulder and right hip pain.  EMS noted blood pressure low, bradycardic, low temperature.  Given IV fluids.  Patient does endorse pain in his right shoulder and right hip from fall.  He says he cannot use his left leg well since his prior surgery.  Denies any chest pain or shortness of breath.  Denies any head or neck pain.  The history is provided by the patient and the EMS personnel.  Fall This is a new problem. The current episode started 6 to 12 hours ago. The problem has not changed since onset.Pertinent negatives include no chest pain, no abdominal pain, no headaches and no shortness of breath. The symptoms are aggravated by bending and twisting. Nothing relieves the symptoms. He has tried rest for the symptoms. The treatment provided no relief.       Home Medications Prior to Admission medications   Medication Sig Start Date End Date Taking? Authorizing Provider  docusate sodium (COLACE) 100 MG capsule Take 1 capsule (100 mg total) by mouth daily. 11/08/21   Rickard Patience, MD  feeding supplement (ENSURE ENLIVE / ENSURE PLUS) LIQD Take 237 mLs by mouth 3 (three) times daily between meals. 10/17/21   Delfino Lovett, MD  ferrous sulfate 325 (65 FE) MG EC tablet Take 1 tablet (325 mg total) by mouth daily. 11/08/21   Rickard Patience, MD  haloperidol decanoate (HALDOL DECANOATE) 100 MG/ML injection Inject 1 mL (100 mg total) into the muscle once for 1 dose. Next dose due 5/15 Patient taking differently: Inject 100 mg into the muscle every 28 (twenty-eight) days. 07/16/18 07/20/22  [name], Jackquline Denmark,  MD  INVEGA SUSTENNA 234 MG/1.5ML injection Inject into the muscle. 12/01/21   [provider]  midodrine (PROAMATINE) 10 MG tablet Take 1.5 tablets (15 mg total) by mouth 3 (three) times daily with meals. 07/25/22   Hollice Espy, MD  Multiple Vitamin (MULTIVITAMIN WITH MINERALS) TABS tablet Take 1 tablet by mouth daily. 07/26/22   Hollice Espy, MD      Allergies    Patient has no known allergies.    Review of Systems   Review of Systems  Constitutional:  Negative for fever.  Eyes:  Negative for visual disturbance.  Respiratory:  Negative for shortness of breath.   Cardiovascular:  Negative for chest pain.  Gastrointestinal:  Negative for abdominal pain.  Musculoskeletal:  Negative for neck pain.  Neurological:  Positive for weakness. Negative for headaches.    Physical Exam Updated Vital Signs BP 117/63   Pulse 72   Temp (!) 95.4 F (35.2 C) (Rectal)   Resp 19   Ht 5\' 7"  (1.702 m)   Wt 65.3 kg   SpO2 98%   BMI 22.55 kg/m  Physical Exam Vitals and nursing note reviewed.  Constitutional:      General: He is not in acute distress.    Appearance: Normal appearance. He is well-developed.  HENT:     Head: Normocephalic and atraumatic.  Eyes:     Conjunctiva/sclera:  Conjunctivae normal.  Cardiovascular:     Rate and Rhythm: Regular rhythm. Bradycardia present.     Heart sounds: No murmur heard. Pulmonary:     Effort: Pulmonary effort is normal. No respiratory distress.     Breath sounds: Normal breath sounds.  Abdominal:     Palpations: Abdomen is soft.     Tenderness: There is no abdominal tenderness. There is no guarding or rebound.  Musculoskeletal:        General: Tenderness present. No deformity. Normal range of motion.     Cervical back: Neck supple.  Skin:    General: Skin is warm and dry.     Capillary Refill: Capillary refill takes less than 2 seconds.  Neurological:     Mental Status: He is alert.     Comments: Patient is awake.  No  slurred speech or obvious facial droop.  He appears weak in his left leg.  He is able to use his left arm.  He is moving his right arm and right leg with pain in his shoulder and hip although no definite neurologic deficit.     ED Results / Procedures / Treatments   Labs (all labs ordered are listed, but only abnormal results are displayed) Labs Reviewed  COMPREHENSIVE METABOLIC PANEL - Abnormal; Notable for the following components:      Result Value   Glucose, Bld 69 (*)    Total Protein 6.2 (*)    Albumin 3.0 (*)    All other components within normal limits  CBC WITH DIFFERENTIAL/PLATELET - Abnormal; Notable for the following components:   WBC 3.1 (*)    Hemoglobin 12.5 (*)    RDW 20.9 (*)    Platelets 51 (*)    Lymphs Abs 0.4 (*)    All other components within normal limits  APTT - Abnormal; Notable for the following components:   aPTT 45 (*)    All other components within normal limits  URINALYSIS, W/ REFLEX TO CULTURE (INFECTION SUSPECTED) - Abnormal; Notable for the following components:   APPearance HAZY (*)    Nitrite POSITIVE (*)    Leukocytes,Ua MODERATE (*)    All other components within normal limits  CULTURE, BLOOD (ROUTINE X 2)  CULTURE, BLOOD (ROUTINE X 2)  URINE CULTURE  MRSA NEXT GEN BY PCR, NASAL  LACTIC ACID, PLASMA  LACTIC ACID, PLASMA  PROTIME-INR  TSH  MAGNESIUM  MAGNESIUM  PHOSPHORUS  HIV ANTIBODY (ROUTINE TESTING W REFLEX)  CBC  BASIC METABOLIC PANEL    EKG EKG Interpretation  Date/Time:  Friday November 10 2022 10:38:36 EDT Ventricular Rate:  40 PR Interval:  351 QRS Duration: 113 QT Interval:  475 QTC Calculation: 388 R Axis:   70 Text Interpretation: Sinus bradycardia Prolonged PR interval Borderline intraventricular conduction delay rate much slower than prior 2/24 Confirmed by Meridee Score [phone]) on 11/10/2022 10:54:05 AM  Radiology CT Hip Right Wo Contrast  Result Date: 11/10/2022 CLINICAL DATA:  Hip trauma, fracture suspected.   Unwitnessed fall. EXAM: CT OF THE RIGHT HIP WITHOUT CONTRAST TECHNIQUE: Multidetector CT imaging of the right hip was performed according to the standard protocol. Multiplanar CT image reconstructions were also generated. RADIATION DOSE REDUCTION: This exam was performed according to the departmental dose-optimization program which includes automated exposure control, adjustment of the mA and/or kV according to patient size and/or use of iterative reconstruction technique. COMPARISON:  Radiograph performed earlier on the same date. FINDINGS: Bones/Joint/Cartilage No evidence of fracture or dislocation. No appreciable joint effusion. Mild  superolateral hip joint space narrowing with marginal spurring suggesting mild osteoarthritis. Sacroiliac joint and pubic symphysis are intact. Pubic bones are intact. Ligaments Suboptimally assessed by CT. Muscles and Tendons Muscles are normal in bulk. No intramuscular hematoma or fluid collection. Tendons of the flexor, extensor and adductor compartments are intact. Soft tissues Vascular calcifications. Skin and subcutaneous soft tissues are otherwise within normal limits. Pelvic viscera: Colonic diverticulosis without evidence of acute diverticulitis. IMPRESSION: 1. No evidence of fracture or dislocation. 2. Mild osteoarthritis of the right hip. 3. Muscles and tendons are intact. 4. Colonic diverticulosis without evidence of acute diverticulitis. 5. Vascular calcifications. Electronically Signed   By: Larose Hires D.O.   On: 11/10/2022 16:22   DG Shoulder Right  Result Date: 11/10/2022 CLINICAL DATA:  Weakness.  Fall EXAM: RIGHT SHOULDER - 3 VIEW COMPARISON:  None Available. FINDINGS: Osteopenia. Slight degenerative changes along the [facility] joint with well corticated ossific density superiorly. Small osteophytes. Preserved glenohumeral joint. No fracture or dislocation. IMPRESSION: Osteopenia.  Slight degenerative changes of the [facility] joint. Electronically Signed   By: Karen Kays  M.D.   On: 11/10/2022 12:31   DG Hip Unilat With Pelvis 2-3 Views Right  Result Date: 11/10/2022 CLINICAL DATA:  Pain after fall EXAM: DG HIP (WITH OR WITHOUT PELVIS) 3V RIGHT COMPARISON:  None Available. FINDINGS: On one view there is slight irregularity of the left femoral neck cortex and trabecula. Mild joint space loss of the hip joints and sacroiliac joints. Hyperostosis. There is however osteopenia. With this level of osteopenia a subtle nondisplaced injury is difficult to completely exclude and if needed additional cross-sectional study as clinically directed. Scattered vascular calcifications. The extreme upper aspect of the iliac crests are obscured by overlapping structures. IMPRESSION: On one view there is slight cortical irregularity and trabecula of the left femoral neck. Subtle injury is difficult to exclude. Recommend cross-sectional study to exclude injury when clinically appropriate. Mild degenerative changes.  Osteopenia Electronically Signed   By: Karen Kays M.D.   On: 11/10/2022 12:30   DG Chest Port 1 View  Result Date: 11/10/2022 CLINICAL DATA:  Weakness.  Fall EXAM: PORTABLE CHEST 1 VIEW COMPARISON:  Chest x-ray 07/20/2022 and CT scan FINDINGS: Hyperinflation. Stable cardiopericardial silhouette with calcified and tortuous aorta. Interstitial changes are again seen. The hazy opacity along the left hemithorax is slightly improving on the current exam. Overlapping cardiac leads and defibrillator pads. Film is rotated to the left. Question some subglottic narrowing. IMPRESSION: Hyperinflation with chronic interstitial changes. Slight asymmetric opacity in the left hemithorax is decreased today. Question subglottic narrowing on the frontal view. Please correlate with symptoms and additional evaluation to confirm when clinically appropriate Electronically Signed   By: Karen Kays M.D.   On: 11/10/2022 12:28    Procedures .Critical Care  Performed by: Terrilee Files, MD Authorized  by: Terrilee Files, MD   Critical care provider statement:    Critical care time (minutes):  45   Critical care time was exclusive of:  Separately billable procedures and treating other patients   Critical care was necessary to treat or prevent imminent or life-threatening deterioration of the following conditions:  Metabolic crisis and sepsis   Critical care was time spent personally by me on the following activities:  Development of treatment plan with patient or surrogate, discussions with consultants, evaluation of patient's response to treatment, examination of patient, obtaining history from patient or surrogate, ordering and performing treatments and interventions, ordering and review of laboratory studies, ordering  and review of radiographic studies, pulse oximetry and re-evaluation of patient's condition   I assumed direction of critical care for this patient from another provider in my specialty: no       Medications Ordered in ED Medications  Chlorhexidine Gluconate Cloth 2 % PADS 6 each (6 each Topical Given 11/10/22 1354)  0.9 %  sodium chloride infusion ( Intravenous Infusion Verify 11/10/22 1541)  cefTRIAXone (ROCEPHIN) 2 g in sodium chloride 0.9 % 100 mL IVPB (has no administration in time range)  risperiDONE (RISPERDAL) tablet 0.5 mg (has no administration in time range)  acetaminophen (TYLENOL) tablet 650 mg (has no administration in time range)    Or  acetaminophen (TYLENOL) suppository 650 mg (has no administration in time range)  ondansetron (ZOFRAN) tablet 4 mg (has no administration in time range)    Or  ondansetron (ZOFRAN) injection 4 mg (has no administration in time range)  nicotine (NICODERM CQ - dosed in mg/24 hours) patch 14 mg (14 mg Transdermal Patch Applied 11/10/22 1703)  lactated ringers bolus 500 mL (0 mLs Intravenous Stopped 11/10/22 1221)  atropine 1 MG/ML injection (  Return to [facility] 11/10/22 1313)  atropine 1 MG/10ML injection (  Return to [facility] 11/10/22  1312)  hydrocortisone sodium succinate (SOLU-CORTEF) 100 MG injection 100 mg (100 mg Intravenous Given 11/10/22 1229)  cefTRIAXone (ROCEPHIN) 1 g in sodium chloride 0.9 % 100 mL IVPB (0 g Intravenous Stopped 11/10/22 1310)    ED Course/ Medical Decision Making/ A&P Clinical Course as of 11/10/22 1744  Fri Nov 10, 2022  1221 Patient's chest x-ray right shoulder x-ray right hip x-ray do not show any acute traumatic findings.  Awaiting radiology reading. [MB]  1308 Discussed with [facility] hospitalist Dr. Gwenlyn Perking who asked that we get a CT of his hip for further evaluation of the abnormal reading of the plain film.  He otherwise is comfortable with admitting the patient [MB]    Clinical Course User Index [MB] Terrilee Files, MD                             Medical Decision Making Amount and/or Complexity of Data Reviewed Labs: ordered. Radiology: ordered.  Risk Prescription drug management. Decision regarding hospitalization.   This patient complains of fall right hip and shoulder pain, possible sepsis; this involves an extensive number of treatment Options and is a complaint that carries with it a high risk of complications and morbidity. The differential includes sepsis, Sirs, hypothermia, hypothyroidism, metabolic derangement, dehydration, fracture, contusion  I ordered, reviewed and interpreted labs, which included CBC with a low white count stable hemoglobin, low platelets, chemistries with mildly low glucose normal LFTs, blood culture sent, lactate normal, urinalysis possible signs of infection I ordered medication IV fluids IV antibiotics IV steroids and reviewed PMP when indicated. I ordered imaging studies which included x-rays of chest right hip right shoulder and CT right hip and I independently    visualized and interpreted imaging which showed no acute findings Additional history obtained from EMS Previous records obtained and reviewed in epic including prior discharge  summaries I consulted [facility] hospitalist Dr. Gwenlyn Perking and discussed lab and imaging findings and discussed disposition.  Cardiac monitoring reviewed, sinus bradycardia Social determinants considered, ongoing tobacco use and housing insecurity Critical Interventions: Multiple reassessments, placement of Bair hugger, workup for sepsis and patient with hypothermia bradycardia  After the interventions stated above, I reevaluated the patient and found patient's blood pressure to  be stable, temperature to be trending up along with heart rate. Admission and further testing considered, patient will benefit from mission to the hospital for further management.         Final Clinical Impression(s) / ED Diagnoses Final diagnoses:  Hypothermia, initial encounter  Bradycardia    Rx / DC Orders ED Discharge Orders     None         Terrilee Files, MD 11/10/22 1747

## 2022-11-10 NOTE — Assessment & Plan Note (Signed)
-  Patient has met sepsis criteria at time of admission with leukopenia, hypothermia and identified source of infection at his urine. -Cultures has been taking and will follow results. -Fluid resuscitation initiated and patient started on Rocephin 2 g daily. -Bear hugger applied and patient admitted to stepdown unit. -Follow clinical response.

## 2022-11-10 NOTE — Assessment & Plan Note (Signed)
-  Left hip stage II pressure injury present at time of admission -No signs of superimposed infection -Continue preventive measures and constant repositioning.

## 2022-11-10 NOTE — ED Notes (Signed)
On arrival pt was cool to touch but alert and oriented.  Strong urine smell.  Pt clothing, depend, underwear and pants as well as sheet he was on was saturated in old urine.  Soiled items removes, clothing bagged.  Pt cleaned with soapy water and cleaner and placed in new depend and gown.

## 2022-11-11 DIAGNOSIS — I959 Hypotension, unspecified: Secondary | ICD-10-CM | POA: Diagnosis not present

## 2022-11-11 DIAGNOSIS — F250 Schizoaffective disorder, bipolar type: Secondary | ICD-10-CM | POA: Diagnosis not present

## 2022-11-11 DIAGNOSIS — A419 Sepsis, unspecified organism: Secondary | ICD-10-CM | POA: Diagnosis not present

## 2022-11-11 DIAGNOSIS — T68XXXA Hypothermia, initial encounter: Secondary | ICD-10-CM

## 2022-11-11 DIAGNOSIS — N390 Urinary tract infection, site not specified: Secondary | ICD-10-CM | POA: Diagnosis not present

## 2022-11-11 DIAGNOSIS — F17200 Nicotine dependence, unspecified, uncomplicated: Secondary | ICD-10-CM | POA: Diagnosis not present

## 2022-11-11 LAB — GLUCOSE, CAPILLARY
Glucose-Capillary: 156 mg/dL — ABNORMAL HIGH (ref 70–99)
Glucose-Capillary: 46 mg/dL — ABNORMAL LOW (ref 70–99)
Glucose-Capillary: 81 mg/dL (ref 70–99)
Glucose-Capillary: 114 mg/dL — ABNORMAL HIGH (ref 70–99)

## 2022-11-11 LAB — URINE CULTURE: Culture: 80000 — AB

## 2022-11-11 LAB — CBC
HCT: 37 % — ABNORMAL LOW (ref 39.0–52.0)
Hemoglobin: 11.4 g/dL — ABNORMAL LOW (ref 13.0–17.0)
MCH: 26.3 pg (ref 26.0–34.0)
MCHC: 30.8 g/dL (ref 30.0–36.0)
MCV: 85.5 fL (ref 80.0–100.0)
Platelets: 52 10*3/uL — ABNORMAL LOW (ref 150–400)
RBC: 4.33 MIL/uL (ref 4.22–5.81)
RDW: 20.4 % — ABNORMAL HIGH (ref 11.5–15.5)
WBC: 4.1 10*3/uL (ref 4.0–10.5)
nRBC: 0 % (ref 0.0–0.2)

## 2022-11-11 LAB — BASIC METABOLIC PANEL
Anion gap: 7 (ref 5–15)
BUN: 16 mg/dL (ref 8–23)
CO2: 25 mmol/L (ref 22–32)
Calcium: 9 mg/dL (ref 8.9–10.3)
Chloride: 111 mmol/L (ref 98–111)
Creatinine, Ser: 1.19 mg/dL (ref 0.61–1.24)
GFR, Estimated: >60 mL/min (ref >60)
Glucose, Bld: 44 mg/dL — CL (ref 70–99)
Potassium: 4.2 mmol/L (ref 3.5–5.1)
Sodium: 143 mmol/L (ref 135–145)

## 2022-11-11 LAB — BLOOD CULTURE ID PANEL (REFLEXED) - BCID2

## 2022-11-11 LAB — CULTURE, BLOOD (ROUTINE X 2): Special Requests: ADEQUATE

## 2022-11-11 MED ORDER — NOREPINEPHRINE 4 MG/250ML-% IV SOLN
2.0000 ug/min | INTRAVENOUS | Status: DC
  Administered 2022-11-11: 2 ug/min via INTRAVENOUS
  Filled 2022-11-11: qty 250

## 2022-11-11 MED ORDER — VANCOMYCIN HCL 1500 MG/300ML IV SOLN
1500.0000 mg | Freq: Once | INTRAVENOUS | Status: AC
  Administered 2022-11-11: 1500 mg via INTRAVENOUS
  Filled 2022-11-11: qty 300

## 2022-11-11 MED ORDER — SODIUM CHLORIDE 0.9 % IV BOLUS
500.0000 mL | Freq: Once | INTRAVENOUS | Status: AC
  Administered 2022-11-11: 500 mL via INTRAVENOUS

## 2022-11-11 MED ORDER — SODIUM CHLORIDE 0.9 % IV BOLUS: 500.0000 mL | Freq: Once | INTRAVENOUS | Status: DC

## 2022-11-11 MED ORDER — HALOPERIDOL LACTATE 5 MG/ML IJ SOLN
2.0000 mg | Freq: Once | INTRAMUSCULAR | Status: AC
  Administered 2022-11-12: 2 mg via INTRAVENOUS

## 2022-11-11 MED ORDER — MIDODRINE HCL 5 MG PO TABS
5.0000 mg | ORAL_TABLET | Freq: Three times a day (TID) | ORAL | Status: DC
  Administered 2022-11-11 – 2022-11-13 (×5): 5 mg via ORAL
  Filled 2022-11-11 (×6): qty 1

## 2022-11-11 MED ORDER — VANCOMYCIN HCL 1250 MG/250ML IV SOLN
1250.0000 mg | INTRAVENOUS | Status: DC
  Administered 2022-11-12 – 2022-11-14 (×3): 1250 mg via INTRAVENOUS
  Filled 2022-11-11 (×3): qty 250

## 2022-11-11 MED ORDER — HALOPERIDOL LACTATE 5 MG/ML IJ SOLN
1.0000 mg | Freq: Three times a day (TID) | INTRAMUSCULAR | Status: DC | PRN
  Administered 2022-11-11 – 2022-11-13 (×2): 1 mg via INTRAVENOUS
  Filled 2022-11-11 (×3): qty 1

## 2022-11-11 MED ORDER — SODIUM CHLORIDE 0.9 % IV SOLN
250.0000 mL | INTRAVENOUS | Status: DC
  Administered 2022-11-19: 250 mL via INTRAVENOUS

## 2022-11-11 MED ORDER — DEXTROSE 50 % IV SOLN
1.0000 | Freq: Once | INTRAVENOUS | Status: AC
  Administered 2022-11-11: 50 mL via INTRAVENOUS
  Filled 2022-11-11: qty 50

## 2022-11-11 NOTE — Progress Notes (Signed)
Progress Note   Patient: Dustin [name] ZOX:[id number] DOB: 1955/12/19 DOA: 11/10/2022     1 DOS: the patient was seen and examined on 11/11/2022   Brief hospital admission narrative: Amadi Sibayan Degenhart is a 67 y.o. male with medical history significant of schizophrenia, colon cancer, tobacco abuse and anemia; who presented to the hospital secondary to unwitnessed mechanical fall and generalized weakness.  Workup demonstrated the presence of hypothermia, bradycardia and leukopenia; urinalysis suggesting the presence of UTI.  Patient met criteria for sepsis.  Due to underlying history of schizophrenia patient history noncontributory..   Patient reported no chest pain, no nausea, no vomiting, no abdominal pain, no focal weaknesses.  He expressed pain in his right shoulder and right hip area.   Cultures taken, fluid resuscitation initiated and IV antibiotics started.  Lactic acid within normal limits.  TRH consulted to place patient in the hospital for further evaluation and management.  Assessment and Plan:. * Sepsis secondary to UTI [facility]) -Patient has met sepsis criteria at time of admission with leukopenia, hypothermia and identified source of infection at his urine and positive blood culture for gram-negative rods. -Overnight has also demonstrated the presence of hypotension; in part secondary to infection but patient with prior history of hypotension requiring midodrine. -Continue to follow culture results, continue current antibiotics -Continue fluid resuscitation and supportive care -No longer hypothermic and his Leukopenia has resolved. -Continue the use of midodrine and adjust as needed. -Wean off peripheral Levophed initiated overnight.  Schizoaffective disorder, bipolar type (HCC) -Patient lives at group home at baseline -Continue Risperdal -Continue supportive care and constant reorientation. -Resume after discharge use of monthly Haldol and Invega injections.  Tobacco use  disorder -Cessation counseling provided -Nicotine patch has been ordered  Pressure injury of skin -Left hip stage II pressure injury present at time of admission -No signs of superimposed infection -Continue preventive measures and constant repositioning.  Malignant neoplasm of ascending colon [facility]) -Continue outpatient follow-up with oncology service -Per records patient is due for colonoscopy evaluation at the end of June. -Stable hemoglobin appreciated.  Thrombocytopenia: -Continue to follow platelet count trend -Avoid the use of heparin products  Subjective:  No chest pain, no nausea, no vomiting; overnight requiring initiation of peripheral Levophed to maintain Blood pressure.  Low-grade temperature appreciated.  Improved mentation on today's examination.  Physical Exam: Vitals:   11/11/22 0815 11/11/22 0900 11/11/22 0945 11/11/22 1000  BP: (!) 94/53 (!) 92/53 (!) 99/52 (!) 101/51  Pulse: (!) 56 88 (!) 50 (!) 50  Resp: 11 18 10 12   Temp:      TempSrc:      SpO2: 94% 98% 97% 97%  Weight:      Height:       General exam: Alert, awake, following commands/answer appropriately.  Reports no chest pain, no shortness of breath, no nausea or vomiting.  Overnight low blood pressure appreciated; positive blood culture for gram-negative rods Respiratory system: Good saturation on room air; no wheezing or crackles on exam. Cardiovascular system:RRR. No murmurs, rubs, gallops. Gastrointestinal system: Abdomen is nondistended, soft and nontender. No organomegaly or masses felt. Normal bowel sounds heard. Central nervous system: Alert and oriented. No focal neurological deficits. Extremities: No cyanosis or clubbing. Skin: No rashes, lesions or ulcers  Data Reviewed: CBC: White blood cells 4.1, hemoglobin 11.4 and platelet count 52 K Basic metabolic panel:Sodium 143, potassium 4.2, chloride 111, bicarb 25, BUN 16, creatinine 1.19 and GFR >60  Family Communication: No family at  bedside.  Disposition:  Status is: Inpatient Remains inpatient appropriate because: Continue treatment with IV antibiotics, follow culture results and Relies blood pressure.   Planned Discharge Destination: group Home   Time spent: 50 minutes  Author: Vassie Loll, MD 11/11/2022 10:07 AM  For on call review [email or web address].

## 2022-11-11 NOTE — Progress Notes (Addendum)
PHARMACY - PHYSICIAN COMMUNICATION CRITICAL VALUE ALERT - BLOOD CULTURE IDENTIFICATION (BCID)  Dustin [name] is an 67 y.o. male who presented to [facility] on 11/10/2022 with a chief complaint of possible uti and fall.   Assessment:  Patient with possible sepsis d/t uti, now with 1/3 bottles positive for staph epi.   Name of physician (or Provider) Contacted: [place]  Current antibiotics: ceftriaxone   Changes to prescribed antibiotics recommended:  Recommendations declined by provider due to multiple possible sources of infection After discussion with attending, patient very difficult to read. Will add resistant staph coverage with vancomycin for now and recheck cultures.   Results for orders placed or performed during the hospital encounter of 11/10/22  Blood Culture ID Panel (Reflexed) (Collected: 11/10/2022 11:26 AM)  Result Value Ref Range   Enterococcus faecalis NOT DETECTED NOT DETECTED   Enterococcus Faecium NOT DETECTED NOT DETECTED   Listeria monocytogenes NOT DETECTED NOT DETECTED   Staphylococcus species DETECTED (A) NOT DETECTED   Staphylococcus aureus (BCID) NOT DETECTED NOT DETECTED   Staphylococcus epidermidis DETECTED (A) NOT DETECTED   Staphylococcus lugdunensis NOT DETECTED NOT DETECTED   Streptococcus species NOT DETECTED NOT DETECTED   Streptococcus agalactiae NOT DETECTED NOT DETECTED   Streptococcus pneumoniae NOT DETECTED NOT DETECTED   Streptococcus pyogenes NOT DETECTED NOT DETECTED   A.calcoaceticus-baumannii NOT DETECTED NOT DETECTED   Bacteroides fragilis NOT DETECTED NOT DETECTED   Enterobacterales NOT DETECTED NOT DETECTED   Enterobacter cloacae complex NOT DETECTED NOT DETECTED   Escherichia coli NOT DETECTED NOT DETECTED   Klebsiella aerogenes NOT DETECTED NOT DETECTED   Klebsiella oxytoca NOT DETECTED NOT DETECTED   Klebsiella pneumoniae NOT DETECTED NOT DETECTED   Proteus species NOT DETECTED NOT DETECTED   Salmonella species NOT DETECTED NOT  DETECTED   Serratia marcescens NOT DETECTED NOT DETECTED   Haemophilus influenzae NOT DETECTED NOT DETECTED   Neisseria meningitidis NOT DETECTED NOT DETECTED   Pseudomonas aeruginosa NOT DETECTED NOT DETECTED   Stenotrophomonas maltophilia NOT DETECTED NOT DETECTED   Candida albicans NOT DETECTED NOT DETECTED   Candida auris NOT DETECTED NOT DETECTED   Candida glabrata NOT DETECTED NOT DETECTED   Candida krusei NOT DETECTED NOT DETECTED   Candida parapsilosis NOT DETECTED NOT DETECTED   Candida tropicalis NOT DETECTED NOT DETECTED   Cryptococcus neoformans/gattii NOT DETECTED NOT DETECTED   Methicillin resistance mecA/C DETECTED (A) NOT DETECTED    Sheppard Coil PharmD., BCPS Clinical Pharmacist 11/11/2022 1:14 PM

## 2022-11-11 NOTE — Progress Notes (Addendum)
RN called due to patient's BP in hypotensive range (87/49, MAP 61), IV NS 500 mL bolus was given, BP improved, but only transitorily, a repeat bolus was given without any response to the fluid and with worsening BP at 73/46 with a MAP of 55.  Another 500 mL bolus of NS was given and patient was started on Levophed with goal to wean this off once the blood pressure is more stable.  Patient was in no acute distress.    Total time:  18 minutes This includes time reviewing the chart including progress notes, labs, EKGs, taking medical decisions, ordering labs and documenting findings.

## 2022-11-11 NOTE — TOC Progression Note (Signed)
Transition of Care [facility]) - Progression Note    Patient Details  Name: Dustin [name] MRN: [id number] Date of Birth: 06-Aug-1955  Transition of Care [facility]) CM/SW Contact  Catalina Gravel, [place] Phone Number: 11/11/2022, 3:26 PM  Clinical Narrative:    CSW contacted [name] to verify number [phone]. Brother [name] identified as guardian [phone].Called brother, had concern and medical questions.  Plan is to return to [place].  CSW secure chat to Dr. With Guardian number. DC expected Monday. TOC to follow.     Barriers to Discharge: Continued Medical Work up  Expected Discharge Plan and Services                                               Social Determinants of Health (SDOH) Interventions SDOH Screenings   Housing: High Risk (07/21/2022)  Alcohol Screen: Low Risk  (03/06/2022)  Depression (PHQ2-9): Low Risk  (03/06/2022)  Tobacco Use: High Risk (11/10/2022)    Readmission Risk Interventions     No data to display

## 2022-11-12 ENCOUNTER — Inpatient Hospital Stay (HOSPITAL_COMMUNITY): Payer: 59

## 2022-11-12 ENCOUNTER — Other Ambulatory Visit (HOSPITAL_COMMUNITY): Payer: Self-pay | Admitting: *Deleted

## 2022-11-12 DIAGNOSIS — F250 Schizoaffective disorder, bipolar type: Secondary | ICD-10-CM | POA: Diagnosis not present

## 2022-11-12 DIAGNOSIS — F17200 Nicotine dependence, unspecified, uncomplicated: Secondary | ICD-10-CM | POA: Diagnosis not present

## 2022-11-12 DIAGNOSIS — R7881 Bacteremia: Secondary | ICD-10-CM | POA: Diagnosis present

## 2022-11-12 DIAGNOSIS — A419 Sepsis, unspecified organism: Secondary | ICD-10-CM | POA: Diagnosis not present

## 2022-11-12 DIAGNOSIS — T68XXXA Hypothermia, initial encounter: Secondary | ICD-10-CM | POA: Diagnosis not present

## 2022-11-12 LAB — URINE CULTURE

## 2022-11-12 LAB — GLUCOSE, CAPILLARY
Glucose-Capillary: 102 mg/dL — ABNORMAL HIGH (ref 70–99)
Glucose-Capillary: 91 mg/dL (ref 70–99)

## 2022-11-12 LAB — CULTURE, BLOOD (ROUTINE X 2)

## 2022-11-12 MED ORDER — DEXTROSE-SODIUM CHLORIDE 5-0.9 % IV SOLN: INTRAVENOUS | Status: DC

## 2022-11-12 NOTE — Progress Notes (Signed)
*  PRELIMINARY RESULTS* Echocardiogram Patient refused echocardiogram. Nurse informed.     Stacey Drain 11/12/2022, 2:38 PM

## 2022-11-12 NOTE — Progress Notes (Signed)
Progress Note   Patient: Dustin [name] WGN:[id number] DOB: 07/07/1955 DOA: 11/10/2022     2 DOS: the patient was seen and examined on 11/12/2022   Brief hospital admission narrative: Dustin [name] is a 67 y.o. male with medical history significant of schizophrenia, colon cancer, tobacco abuse and anemia; who presented to the hospital secondary to unwitnessed mechanical fall and generalized weakness.  Workup demonstrated the presence of hypothermia, bradycardia and leukopenia; urinalysis suggesting the presence of UTI.  Patient met criteria for sepsis.  Due to underlying history of schizophrenia patient history noncontributory..   Patient reported no chest pain, no nausea, no vomiting, no abdominal pain, no focal weaknesses.  He expressed pain in his right shoulder and right hip area.   Cultures taken, fluid resuscitation initiated and IV antibiotics started.  Lactic acid within normal limits.  TRH consulted to place patient in the hospital for further evaluation and management.  Assessment and Plan:. * Sepsis secondary to UTI [facility]) -Patient has met sepsis criteria at time of admission with leukopenia, hypothermia and identified source of infection at his urine and positive blood culture for gram-negative rods and MRSA. -Overnight has also demonstrated the presence of hypotension; in part secondary to infection but patient with prior history of hypotension requiring midodrine. -Continue to follow culture results, continue current antibiotics -Continue fluid resuscitation and supportive care -No longer hypothermic and his Leukopenia has resolved. -Continue the use of midodrine and adjust as needed. -Patient off pressor supports currently.  Blood pressure stable.  Schizoaffective disorder, bipolar type (HCC) -Patient lives at group home at baseline -Continue Risperdal -Continue supportive care and constant reorientation. -Resume after discharge use of monthly Haldol and Invega  injections.  Tobacco use disorder -Cessation counseling provided -Nicotine patch has been ordered  Bacteremia -Patient's blood culture suggesting the presence of MRSA -Speciation and sensitivity pending -Empirical vancomycin has been initiated -2D echo has been ordered -Repeat blood cultures.  Pressure injury of skin -Left hip stage II pressure injury present at time of admission -No signs of superimposed infection -Continue preventive measures and constant repositioning.  Malignant neoplasm of ascending colon [facility]) -Continue outpatient follow-up with oncology service -Per records patient is due for colonoscopy evaluation at the end of June. -Stable hemoglobin appreciated.  Thrombocytopenia: -Continue to follow platelet count trend -Avoid the use of heparin products -No overt bleeding appreciated.  Subjective:  No chest pain, no nausea, no vomiting.  Low-grade temperature appreciated overnight.  Currently off pressor support demonstrating good blood pressure with the use of midodrine.  Blood cultures demonstrated the presence of MRSA; vancomycin started and echo pending.  Physical Exam: Vitals:   11/12/22 0500 11/12/22 0600 11/12/22 0734 11/12/22 0800  BP: 135/77 (!) 149/86  (!) 145/84  Pulse: (!) 38     Resp: 13 (!) 9  (!) 8  Temp:   97.6 F (36.4 C)   TempSrc:   Oral   SpO2: 97%     Weight:      Height:       General exam: Alert, awake, oriented x 1; experiencing episodes of paranoia, restlessness and agitation.  No fever.  No hypothermia. Respiratory system: Good air movement bilaterally; no using accessory muscles.  Good saturation on room air. Cardiovascular system: Sinus bradycardia, no rubs, no gallops, no JVD. Gastrointestinal system: Abdomen is nondistended, soft and nontender. No organomegaly or masses felt. Normal bowel sounds heard. Central nervous system: No focal neurological deficits. Extremities: No cyanosis or clubbing. Skin: No petechiae.  Stage II  pressure injury left hip present at time of admission.  No signs of superimposed infection appreciated. Psychiatry: Judgement and insight appear impaired secondary to underlying psychiatric disorder.  Latest Data Reviewed: CBC: White blood cells 4.1, hemoglobin 11.4 and platelet count 52 K Basic metabolic panel:Sodium 143, potassium 4.2, chloride 111, bicarb 25, BUN 16, creatinine 1.19 and GFR >60  Family Communication: No family at bedside.  Disposition: Status is: Inpatient Remains inpatient appropriate because: Continue treatment with IV antibiotics, follow culture results and follow echo results. BP has stabilize with initiation of midodrine; patient off pressors.   Planned Discharge Destination: group Home    Time spent: 50 minutes  Author: Vassie Loll, MD 11/12/2022 10:40 AM  For on call review [email or web address].

## 2022-11-12 NOTE — Progress Notes (Signed)
Patient refusing CBGs, PO medications, and meals at this time. Dr Gwenlyn Perking informed and orders received to start D5NS at 75cc/hr. Patients family/brother also at bedside and unable to get patient to take medications or eat at this time.

## 2022-11-12 NOTE — Progress Notes (Signed)
Pt rectal temp 34.2, notified Dr. Herma Carson, pt placed on warming blanket.

## 2022-11-12 NOTE — Assessment & Plan Note (Signed)
-  Patient's blood culture suggesting the presence of methicillin resistance epidermidis; suggesting to be a contaminant -Case discussed with Dr. Ilsa Iha ID provider; okay to discontinue vancomycin and pursue treatment of patient's UTI only. -2D echo demonstrated no signs of endocarditis. -At this moment patient do not require contact isolation. -MRSA nasal swab PCR not detected.
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
## 2022-11-13 ENCOUNTER — Other Ambulatory Visit (HOSPITAL_COMMUNITY): Payer: Self-pay | Admitting: *Deleted

## 2022-11-13 ENCOUNTER — Inpatient Hospital Stay (HOSPITAL_COMMUNITY): Payer: 59

## 2022-11-13 DIAGNOSIS — A419 Sepsis, unspecified organism: Secondary | ICD-10-CM | POA: Diagnosis not present

## 2022-11-13 DIAGNOSIS — T68XXXA Hypothermia, initial encounter: Secondary | ICD-10-CM | POA: Diagnosis not present

## 2022-11-13 DIAGNOSIS — R7881 Bacteremia: Secondary | ICD-10-CM

## 2022-11-13 DIAGNOSIS — F250 Schizoaffective disorder, bipolar type: Secondary | ICD-10-CM | POA: Diagnosis not present

## 2022-11-13 DIAGNOSIS — F17200 Nicotine dependence, unspecified, uncomplicated: Secondary | ICD-10-CM | POA: Diagnosis not present

## 2022-11-13 LAB — ECHOCARDIOGRAM COMPLETE
AR max vel: 2.07 cm2
AV Area VTI: 2.19 cm2
AV Area mean vel: 2.25 cm2
AV Mean grad: 3.5 mmHg
AV Peak grad: 8.1 mmHg
Ao pk vel: 1.42 m/s
Area-P 1/2: 4.6 cm2
Height: 67 in
S' Lateral: 3.1 cm
Weight: 2423.3 oz

## 2022-11-13 LAB — BASIC METABOLIC PANEL
Anion gap: 8 (ref 5–15)
BUN: 14 mg/dL (ref 8–23)
CO2: 23 mmol/L (ref 22–32)
Calcium: 8.9 mg/dL (ref 8.9–10.3)
Chloride: 110 mmol/L (ref 98–111)
Creatinine, Ser: 1.16 mg/dL (ref 0.61–1.24)
GFR, Estimated: >60 mL/min (ref >60)
Glucose, Bld: 64 mg/dL — ABNORMAL LOW (ref 70–99)
Potassium: 3.8 mmol/L (ref 3.5–5.1)
Sodium: 141 mmol/L (ref 135–145)

## 2022-11-13 LAB — GLUCOSE, CAPILLARY
Glucose-Capillary: 124 mg/dL — ABNORMAL HIGH (ref 70–99)
Glucose-Capillary: 75 mg/dL (ref 70–99)
Glucose-Capillary: 90 mg/dL (ref 70–99)
Glucose-Capillary: 95 mg/dL (ref 70–99)

## 2022-11-13 LAB — CULTURE, BLOOD (ROUTINE X 2)

## 2022-11-13 MED ORDER — ALPRAZOLAM 0.25 MG PO TABS
0.2500 mg | ORAL_TABLET | Freq: Three times a day (TID) | ORAL | Status: DC | PRN
  Administered 2022-11-16: 0.25 mg via ORAL
  Filled 2022-11-13 (×2): qty 1

## 2022-11-13 MED ORDER — LORAZEPAM 2 MG/ML IJ SOLN
1.0000 mg | Freq: Once | INTRAMUSCULAR | Status: AC
  Administered 2022-11-13: 1 mg via INTRAVENOUS
  Filled 2022-11-13: qty 1

## 2022-11-13 MED ORDER — ORAL CARE MOUTH RINSE: 15.0000 mL | OROMUCOSAL | Status: DC | PRN

## 2022-11-13 NOTE — Progress Notes (Signed)
Progress Note   Patient: Dustin [name] Band ZOX:[id number] DOB: Oct 18, 1955 DOA: 11/10/2022     3 DOS: the patient was seen and examined on 11/13/2022   Brief hospital admission narrative: Dustin [name] is a 67 y.o. male with medical history significant of schizophrenia, colon cancer, tobacco abuse and anemia; who presented to the hospital secondary to unwitnessed mechanical fall and generalized weakness.  Workup demonstrated the presence of hypothermia, bradycardia and leukopenia; urinalysis suggesting the presence of UTI.  Patient met criteria for sepsis.  Due to underlying history of schizophrenia patient history noncontributory..   Patient reported no chest pain, no nausea, no vomiting, no abdominal pain, no focal weaknesses.  He expressed pain in his right shoulder and right hip area.   Cultures taken, fluid resuscitation initiated and IV antibiotics started.  Lactic acid within normal limits.  TRH consulted to place patient in the hospital for further evaluation and management.  Assessment and Plan:. * Sepsis secondary to UTI [facility]) -Patient has met sepsis criteria at time of admission with leukopenia, hypothermia and identified source of infection at his urine and positive blood culture for gram-negative rods and MRSA. -Overnight has also demonstrated the presence of hypotension; in part secondary to infection but patient with prior history of hypotension requiring midodrine. -Continue to follow culture results, continue current antibiotics -Continue fluid resuscitation and supportive care -No longer hypothermic and his Leukopenia has resolved. -Continue the use of midodrine and adjust as needed. -Patient off pressor supports currently.  Blood pressure stable.  Schizoaffective disorder, bipolar type (HCC) -Patient lives at group home at baseline -Continue Risperdal -Continue supportive care and constant reorientation. -Resume after discharge use of monthly Haldol and Invega  injections.  Tobacco use disorder -Cessation counseling provided -Nicotine patch has been ordered  Bacteremia -Patient's blood culture suggesting the presence of MRSA -Speciation and sensitivity pending -Empirical vancomycin has been initiated -2D echo has been ordered -Repeat blood cultures.  Pressure injury of skin -Left hip stage II pressure injury present at time of admission -No signs of superimposed infection -Continue preventive measures and constant repositioning.  Malignant neoplasm of ascending colon [facility]) -Continue outpatient follow-up with oncology service -Per records patient is due for colonoscopy evaluation at the end of June. -Stable hemoglobin appreciated.  Thrombocytopenia: -Continue to follow platelet count trend intermittently. -Continue to avoid the use of heparin products -No overt bleeding appreciated.  Subjective:  No chest pain, no nausea, no vomiting.  Patient was hypothermic overnight; refusing some of his oral medications and experiencing restlessness.   Physical Exam: Vitals:   11/13/22 0800 11/13/22 0900 11/13/22 1045 11/13/22 1100  BP: 101/63 (!) 94/56 (!) 88/74 107/78  Pulse:    76  Resp: 11 11 14 13   Temp: 98.1 F (36.7 C)     TempSrc: Oral     SpO2: 98%     Weight:      Height:       General exam: Alert, awake, to person and place; calm and following commands at today's evaluation.  Patient ate breakfast and expressed overall feeling better.  Overnight experiencing hypothermia and refusing some of his oral meds. Respiratory system: Good saturation on room air; no using accessory muscle. Cardiovascular system: Sinus bradycardia, no rubs, no gallops, no JVD. Gastrointestinal system: Abdomen is nondistended, soft and nontender. No organomegaly or masses felt. Normal bowel sounds heard. Central nervous system: Alert and oriented. No focal neurological deficits. Extremities: No cyanosis, clubbing or edema. Skin: No petechiae.  Unchanged  stage II left hip  pressure injury present at time of admission. Psychiatry: Flat affect appreciated on exam.  Latest Data Reviewed: CBG: 102>>75 BMET: Sodium 141, potassium 3.8, chloride 110, bicarb 23, BUN 14, creatinine 1.16 and GFR > 60  Family Communication: No family at bedside. Patient's brother and guardian updated over the phone on 11/11/22.  Disposition: Status is: Inpatient Remains inpatient appropriate because: Continue treatment with IV antibiotics, follow culture results and follow echo results. BP has stabilize with initiation of midodrine; patient off pressors.   Planned Discharge Destination: group Home (long term resident of group home)  Time spent: 50 minutes  Author: Vassie Loll, MD 11/13/2022 11:26 AM  For on call review [email or web address].

## 2022-11-13 NOTE — Progress Notes (Signed)
*  PRELIMINARY RESULTS* Echocardiogram 2D Echocardiogram has been performed.  Stacey Drain 11/13/2022, 9:45 AM

## 2022-11-13 NOTE — Progress Notes (Signed)
Pt is fairly lethargic, somewhat responsive to pain, VSS on 1L [place], pt did receive Haldol 1mg  IV at 1133 and Ativan 1mg  IV at 1634, I'm sure this is the contributing factor plus the pt did not sleep at all last night or today for day shift, pt also slightly hypothermic, 96.8, on warming blanket, notified Dr. Herma Carson.

## 2022-11-13 NOTE — Progress Notes (Signed)
Patient placed on 2L [place] for sats in the 83-88 range. MD made aware.   Patient was observed removing medical devices and IV lines. Patient was agitated, confused, and difficult to redirect. Informed physician of patient's behavior and received order for x1 dose of ativan which was effective.   Patient noted with MRSA in the blood. Writer contacted the after hours Infection prevention and spoke to [place] E. who confirmed the patient should be on contact isolation. The provider made aware and new order recd' and contact isolation precautions initiated. Endorses to Public relations account executive

## 2022-11-14 DIAGNOSIS — F250 Schizoaffective disorder, bipolar type: Secondary | ICD-10-CM | POA: Diagnosis not present

## 2022-11-14 DIAGNOSIS — F209 Schizophrenia, unspecified: Secondary | ICD-10-CM

## 2022-11-14 DIAGNOSIS — A419 Sepsis, unspecified organism: Secondary | ICD-10-CM | POA: Diagnosis not present

## 2022-11-14 DIAGNOSIS — I9589 Other hypotension: Secondary | ICD-10-CM

## 2022-11-14 DIAGNOSIS — T68XXXA Hypothermia, initial encounter: Secondary | ICD-10-CM | POA: Diagnosis not present

## 2022-11-14 DIAGNOSIS — F17200 Nicotine dependence, unspecified, uncomplicated: Secondary | ICD-10-CM | POA: Diagnosis not present

## 2022-11-14 LAB — GLUCOSE, CAPILLARY
Glucose-Capillary: 72 mg/dL (ref 70–99)
Glucose-Capillary: 123 mg/dL — ABNORMAL HIGH (ref 70–99)
Glucose-Capillary: 80 mg/dL (ref 70–99)

## 2022-11-14 MED ORDER — MIDODRINE HCL 5 MG PO TABS
5.0000 mg | ORAL_TABLET | Freq: Three times a day (TID) | ORAL | Status: DC
  Administered 2022-11-15 (×3): 5 mg via ORAL
  Filled 2022-11-14 (×3): qty 1

## 2022-11-14 MED ORDER — MIDODRINE HCL 5 MG PO TABS
2.5000 mg | ORAL_TABLET | Freq: Three times a day (TID) | ORAL | Status: DC
  Administered 2022-11-14: 2.5 mg via ORAL
  Filled 2022-11-14: qty 1

## 2022-11-14 NOTE — [place] FL2 (Signed)
MEDICAID FL2 LEVEL OF CARE FORM     IDENTIFICATION  Patient Name: Dustin [name] Birthdate: Feb 12, 1956 Sex: male Admission Date (Current Location): 11/10/2022  [facility] and [place] Number:  [employer] and Address:  [facility],  [address] [place] [address]      Provider Number: [id number]  Attending Physician Name and Address:  Vassie Loll, MD  Relative Name and Phone Number:  Damel, Certo (Brother) [phone]    Current Level of Care: Hospital Recommended Level of Care: Skilled Nursing Facility Prior Approval Number:    Date Approved/Denied:   PASRR Number:    Discharge Plan: SNF    Current Diagnoses: Patient Active Problem List   Diagnosis Date Noted   Bacteremia 11/12/2022   Sepsis secondary to UTI (HCC) 11/10/2022   Pressure injury of skin 11/10/2022   Protein-calorie malnutrition, severe 07/22/2022   COVID-19 virus infection 07/20/2022   Hypotension 07/20/2022   Aspiration pneumonia (HCC) 07/20/2022   Adenocarcinoma of colon (HCC) 12/13/2021   Malignant neoplasm of ascending colon (HCC) 11/08/2021   Family history of cancer 11/08/2021   IDA (iron deficiency anemia)    Colonic mass 10/06/2021   Microcytic anemia 10/05/2021   Syncope 10/05/2021   Symptomatic anemia    Schizoaffective disorder, bipolar type (HCC) 01/24/2017   Tobacco use disorder 11/29/2016    Orientation RESPIRATION BLADDER Height & Weight     Self, Place  Normal External catheter Weight: 68.6 kg Height:  5\' 7"  (170.2 cm)  BEHAVIORAL SYMPTOMS/MOOD NEUROLOGICAL BOWEL NUTRITION STATUS      Continent Diet (See DC summary)  AMBULATORY STATUS COMMUNICATION OF NEEDS Skin   Extensive Assist Verbally Skin abrasions, Other (Comment) (abraion to hip)                       Personal Care Assistance Level of Assistance  Bathing, Feeding, Dressing Bathing Assistance: Maximum assistance Feeding assistance: Limited assistance Dressing  Assistance: Maximum assistance     Functional Limitations Info  Sight, Hearing, Speech Sight Info: Adequate Hearing Info: Adequate Speech Info: Adequate    SPECIAL CARE FACTORS FREQUENCY  PT (By licensed PT)     PT Frequency: 5 Times a week              Contractures Contractures Info: Not present    Additional Factors Info  Code Status, Allergies Code Status Info: FULL Allergies Info: NKDA           Current Medications (11/14/2022):  This is the current hospital active medication list Current Facility-Administered Medications  Medication Dose Route Frequency Provider Last Rate Last Admin   0.9 %  sodium chloride infusion  250 mL Intravenous Continuous Adefeso, Oladapo, DO       acetaminophen (TYLENOL) tablet 650 mg  650 mg Oral Q6H PRN Vassie Loll, MD       Or   acetaminophen (TYLENOL) suppository 650 mg  650 mg Rectal Q6H PRN Vassie Loll, MD       ALPRAZolam Prudy Feeler) tablet 0.25 mg  0.25 mg Oral Q8H PRN Vassie Loll, MD       cefTRIAXone (ROCEPHIN) 2 g in sodium chloride 0.9 % 100 mL IVPB  2 g Intravenous Q24H Vassie Loll, MD 200 mL/hr at 11/14/22 0816 2 g at 11/14/22 0816   Chlorhexidine Gluconate Cloth 2 % PADS 6 each  6 each Topical Daily Vassie Loll, MD   6 each at 11/14/22 0600   dextrose 5 %-0.9 % sodium chloride infusion  Intravenous Continuous Vassie Loll, MD 75 mL/hr at 11/14/22 0622 Infusion Verify at 11/14/22 0622   haloperidol lactate (HALDOL) injection 1 mg  1 mg Intravenous Q8H PRN Vassie Loll, MD   1 mg at 11/13/22 1133   midodrine (PROAMATINE) tablet 2.5 mg  2.5 mg Oral TID WC Strader, [place] M, PA-C       nicotine (NICODERM CQ - dosed in mg/24 hours) patch 14 mg  14 mg Transdermal Daily Vassie Loll, MD   14 mg at 11/14/22 0823   norepinephrine (LEVOPHED) 4mg  in (0.016 mg/mL) premix infusion  2-10 mcg/min Intravenous Titrated Adefeso, Oladapo, DO   Stopped at 11/11/22 1431   ondansetron (ZOFRAN) tablet 4 mg  4 mg Oral Q6H  PRN Vassie Loll, MD       Or   ondansetron [facility]) injection 4 mg  4 mg Intravenous Q6H PRN Vassie Loll, MD       Oral care mouth rinse  15 mL Mouth Rinse PRN Vassie Loll, MD       risperiDONE (RISPERDAL) tablet 0.5 mg  0.5 mg Oral BID Vassie Loll, MD   0.5 mg at 11/14/22 0816   vancomycin (VANCOREADY) IVPB 1250 mg/250 mL  1,250 mg Intravenous Q24H Earnie Larsson, [facility]   Stopped at 11/13/22 1613     Discharge Medications: Please see discharge summary for a list of discharge medications.  Relevant Imaging Results:  Relevant Lab Results:   Additional Information SS# [id number]  Leitha Bleak, RN

## 2022-11-14 NOTE — TOC Initial Note (Signed)
Transition of Care [facility]) - Initial/Assessment Note    Patient Details  Name: Dustin [name] MRN: [id number] Date of Birth: 1956-03-09  [facility] of [facility]) CM/SW Contact:    Leitha Bleak, RN Phone Number: 11/14/2022, 3:31 PM  Clinical Narrative:       PASSR #   [id number] E received. Brother wanting [place] area for SNF.  FL2 completed and sent out for bed offers.           Expected Discharge Plan: Skilled Nursing Facility Barriers to Discharge: Continued Medical Work up   Patient Goals and CMS Choice         Admission diagnosis:  Sepsis secondary to UTI (HCC) [A41.9, N39.0] Patient Active Problem List   Diagnosis Date Noted   Bacteremia 11/12/2022   Sepsis secondary to UTI (HCC) 11/10/2022   Pressure injury of skin 11/10/2022   Protein-calorie malnutrition, severe 07/22/2022   COVID-19 virus infection 07/20/2022   Hypotension 07/20/2022   Aspiration pneumonia (HCC) 07/20/2022   Adenocarcinoma of colon (HCC) 12/13/2021   Malignant neoplasm of ascending colon (HCC) 11/08/2021   Family history of cancer 11/08/2021   IDA (iron deficiency anemia)    Colonic mass 10/06/2021   Microcytic anemia 10/05/2021   Syncope 10/05/2021   Symptomatic anemia    Schizoaffective disorder, bipolar type (HCC) 01/24/2017   Tobacco use disorder 11/29/2016   PCP:  Sallyanne Kuster, NP Pharmacy:   [facility] [address] ([address]), [place] - [address] [place] ([place]) [place] [address] Phone: [phone] Fax: [phone]  [facility] [place], [place] - [address] [place] [address] Phone: [phone] Fax: [phone]     Social Determinants of Health (SDOH) Social History: SDOH Screenings   Housing: High Risk (07/21/2022)  Alcohol Screen: Low Risk  (03/06/2022)  Depression (PHQ2-9): Low Risk  (03/06/2022)  Tobacco Use: High Risk (11/10/2022)   SDOH Interventions:     Readmission Risk Interventions      No data to display

## 2022-11-14 NOTE — Progress Notes (Signed)
Progress Note   Patient: Dustin [name] ZOX:[id number] DOB: January 07, 1956 DOA: 11/10/2022     4 DOS: the patient was seen and examined on 11/14/2022   Brief hospital admission narrative: Dustin [name] is a 67 y.o. male with medical history significant of schizophrenia, colon cancer, tobacco abuse and anemia; who presented to the hospital secondary to unwitnessed mechanical fall and generalized weakness.  Workup demonstrated the presence of hypothermia, bradycardia and leukopenia; urinalysis suggesting the presence of UTI.  Patient met criteria for sepsis.  Due to underlying history of schizophrenia patient history noncontributory..   Patient reported no chest pain, no nausea, no vomiting, no abdominal pain, no focal weaknesses.  He expressed pain in his right shoulder and right hip area.   Cultures taken, fluid resuscitation initiated and IV antibiotics started.  Lactic acid within normal limits.  TRH consulted to place patient in the hospital for further evaluation and management.  Assessment and Plan:. * Sepsis secondary to UTI [facility]) -Patient has met sepsis criteria at time of admission with leukopenia, hypothermia and identified source of infection at his urine and positive blood culture for gram-negative rods and MRSA. -Continue treatment with midodrine for underlying history of hypertension. -Repeat blood culture has demonstrated no growth and after discussing with infectious disease 1 out of 4 staph epidermis methicillin resistant microorganism suggested to be a contaminant. -2D echo has demonstrated preserved ejection fraction, no wall motion abnormalities or concerns for endocarditis. -Will continue treatment with Rocephin IV for another 24 hours and then transition to oral route antibiotics following culture sensitivity to complete a total of 10 days.  Schizoaffective disorder, bipolar type (HCC) -Patient lives at group home at baseline -Continue Risperdal -Continue supportive care  and constant reorientation. -Resume after discharge use of monthly Haldol and Invega injections.  Tobacco use disorder -Cessation counseling provided -Nicotine patch has been ordered  Bacteremia -Patient's blood culture suggesting the presence of methicillin resistance epidermidis; suggesting to be a contaminant -Case discussed with Dr. Ilsa Iha ID provider; okay to discontinue vancomycin and pursue treatment of patient's UTI only. -2D echo demonstrated no signs of endocarditis. -At this moment patient do not require contact isolation. -MRSA nasal swab PCR not detected.  Pressure injury of skin -Left hip stage II pressure injury present at time of admission -No signs of superimposed infection -Continue preventive measures and constant repositioning.  Malignant neoplasm of ascending colon [facility]) -Continue outpatient follow-up with oncology service -Per records patient is due for colonoscopy evaluation at the end of June. -Stable hemoglobin appreciated.  Thrombocytopenia: -Continue to follow platelet count trend intermittently. -Continue to avoid the use of heparin products -No overt bleeding appreciated.  Subjective:  No chest pain, no nausea, no vomiting.  Overnight requiring Bair hugger due to hypothermia.  In no major distress.  Continues to experience intermittent episode of agitation and restlessness and demonstrate physical deconditioning/generalized weakness.[name]  Physical Exam: Vitals:   11/14/22 1338 11/14/22 1400 11/14/22 1500 11/14/22 1610  BP:  (!) 156/68 (!) 167/76 (!) 140/75  Pulse: (!) 41 (!) 49 (!) 48 (!) 44  Resp: 15 14 11 11   Temp: 97.8 F (36.6 C)     TempSrc: Rectal     SpO2: 98% 95% 97% 91%  Weight:      Height:       General exam: Alert, awake, oriented x 1; slightly agitated and asking not to be bothered.  Overnight required the use of Bair hugger in the setting of hypothermia.  No chest pain, no nausea, no  vomiting. Respiratory system: Good air movement  bilaterally; no using accessory muscles.  Good saturation appreciated on room air today. Cardiovascular system: Sinus bradycardia.  No rubs or gallops. Gastrointestinal system: Abdomen is nondistended, soft and nontender. No organomegaly or masses felt. Normal bowel sounds heard. Central nervous system: Alert and oriented. No focal neurological deficits. Extremities: No cyanosis or clubbing. Skin: No petechiae; stage II left hip pressure injury present at time of admission. Psychiatry: Judgement and insight appear impaired secondary to underlying psychiatric schizophrenic disorder.  Latest Data Reviewed: (Some blood work has been refused by patient). CBG: 102>>75 BMET: Sodium 141, potassium 3.8, chloride 110, bicarb 23, BUN 14, creatinine 1.16 and GFR > 60 Repeat blood culture: No growth so far.. -2D echo: Not demonstrating any signs of endocarditis, no wall motion abnormalities and preserved ejection fraction.  Family Communication: No family at bedside.  Disposition: Status is: Inpatient Remains inpatient appropriate because: Continue treatment with IV antibiotics for another 24 hours and then transition to oral antibiotics if patient remains nonfebrile and without hypothermia (plan is to treat for a total of 10 days).  Physical therapy has recommended short-term rehabilitation prior to his return to group home, TOC assisting with placement.  Hopefully medically stable in the next 24-48 hours and with insurance authorization approval.   Planned Discharge Destination: group Home (long term resident of group home); given physical deconditioning will require short-term rehabilitation prior to return to group home.  Time spent: 50 minutes  Author: Vassie Loll, MD 11/14/2022 4:24 PM  For on call review [email or web address].

## 2022-11-14 NOTE — Plan of Care (Signed)
  Problem: Acute Rehab PT Goals(only PT should resolve) Goal: Pt Will Go Supine/Side To Sit Outcome: Progressing Flowsheets (Taken 11/14/2022 1404) Pt will go Supine/Side to Sit: with minimal assist Goal: Patient Will Transfer Sit To/From Stand Outcome: Progressing Flowsheets (Taken 11/14/2022 1404) Patient will transfer sit to/from stand:  with minimal assist  with moderate assist Goal: Pt Will Transfer Bed To Chair/Chair To Bed Outcome: Progressing Flowsheets (Taken 11/14/2022 1404) Pt will Transfer Bed to Chair/Chair to Bed:  with min assist  with mod assist Goal: Pt Will Ambulate Outcome: Progressing Flowsheets (Taken 11/14/2022 1404) Pt will Ambulate:  25 feet  with minimal assist  with moderate assist  with rolling walker   2:05 PM, 11/14/22 Ocie Bob, MPT Physical Therapist with [facility] [phone] office [phone] mobile phone

## 2022-11-14 NOTE — TOC Progression Note (Signed)
Transition of Care [facility]) - Progression Note    Patient Details  Name: Dustin [name] MRN: [id number] Date of Birth: 1956/05/22  Transition of Care [facility]) CM/SW Contact  Leitha Bleak, RN Phone Number: 11/14/2022, 10:36 AM  Clinical Narrative:   CM called family to discuss PT eval for SNF. Brother will call back.     Expected Discharge Plan: Skilled Nursing Facility Barriers to Discharge: Continued Medical Work up  Expected Discharge Plan and Services

## 2022-11-14 NOTE — Evaluation (Signed)
Physical Therapy Evaluation Patient Details Name: Dustin [name] MRN: [id number] DOB: February 16, 1956 Today's Date: 11/14/2022  History of Present Illness  Dustin [name] is a 67 y.o. male with medical history significant of schizophrenia, colon cancer, tobacco abuse and anemia; who presented to the hospital secondary to unwitnessed mechanical fall and generalized weakness.  Workup demonstrated the presence of hypothermia, bradycardia and leukopenia; urinalysis suggesting the presence of UTI.  Patient met criteria for sepsis.  Due to underlying history of schizophrenia patient history noncontributory..     Patient reported no chest pain, no nausea, no vomiting, no abdominal pain, no focal weaknesses.  He expressed pain in his right shoulder and right hip area.   Clinical Impression  Patient demonstrates slow labored movement for sitting up at bedside, once initially seated had frequent posterior leaning, able to self correct after verbal/tactile cueing, very unsteady on feet and limited to a few shuffling side steps before loss of balance due to knees buckling.  Patient put back to bed after therapy with Mod/max assist for repositioning self.  Patient will benefit from continued skilled physical therapy in hospital and recommended venue below to increase strength, balance, endurance for safe ADLs and gait.         Recommendations for follow up therapy are one component of a multi-disciplinary discharge planning process, led by the attending physician.  Recommendations may be updated based on patient status, additional functional criteria and insurance authorization.  Follow Up Recommendations Can patient physically be transported by private vehicle: No     Assistance Recommended at Discharge Intermittent Supervision/Assistance  Patient can return home with the following  A lot of help with bathing/dressing/bathroom;A lot of help with walking and/or transfers;Help with stairs or ramp for  entrance;Assistance with cooking/housework    Equipment Recommendations Rolling walker (2 wheels)  Recommendations for Other Services       Functional Status Assessment Patient has had a recent decline in their functional status and demonstrates the ability to make significant improvements in function in a reasonable and predictable amount of time.     Precautions / Restrictions Precautions Precautions: Fall Restrictions Weight Bearing Restrictions: No      Mobility  Bed Mobility Overal bed mobility: Needs Assistance Bed Mobility: Supine to Sit, Sit to Supine     Supine to sit: Mod assist, Max assist Sit to supine: Mod assist   General bed mobility comments: unsteady labored movement with frequent posterior leaning    Transfers Overall transfer level: Needs assistance Equipment used: Rolling walker (2 wheels) Transfers: Sit to/from Stand Sit to Stand: Max assist           General transfer comment: frequent buckling of knees due to weakness    Ambulation/Gait Ambulation/Gait assistance: Max assist Gait Distance (Feet): 3 Feet Assistive device: Rolling walker (2 wheels) Gait Pattern/deviations: Decreased step length - right, Decreased step length - left, Decreased stride length, Trunk flexed, Shuffle Gait velocity: slow     General Gait Details: limited to a few shuffling side steps before losing balance requiring Max assist to avoid falling  Stairs            Wheelchair Mobility    Modified Rankin (Stroke Patients Only)       Balance Overall balance assessment: Needs assistance Sitting-balance support: Feet supported, No upper extremity supported Sitting balance-Leahy Scale: Poor Sitting balance - Comments: fair/poor seated at EOB   Standing balance support: Reliant on assistive device for balance, During functional activity, Bilateral upper extremity supported Standing  balance-Leahy Scale: Poor Standing balance comment: using RW                              Pertinent Vitals/Pain Pain Assessment Pain Assessment: No/denies pain    Home Living Family/patient expects to be discharged to:: Group home                        Prior Function Prior Level of Function : Needs assist       Physical Assist : Mobility (physical);ADLs (physical) Mobility (physical): Bed mobility;Transfers;Gait;Stairs   Mobility Comments: Household ambulator using SPC ADLs Comments: Assisted by group home staff     Hand Dominance        Extremity/Trunk Assessment   Upper Extremity Assessment Upper Extremity Assessment: Generalized weakness         Cervical / Trunk Assessment Cervical / Trunk Assessment: Normal  Communication   Communication: HOH  Cognition Arousal/Alertness: Awake/alert Behavior During Therapy: WFL for tasks assessed/performed Overall Cognitive Status: No family/caregiver present to determine baseline cognitive functioning                                 General Comments: very HOH, followed most directions consistently        General Comments      Exercises     Assessment/Plan    PT Assessment Patient needs continued PT services  PT Problem List Decreased strength;Decreased activity tolerance;Decreased balance;Decreased mobility       PT Treatment Interventions DME instruction;Gait training;Stair training;Therapeutic activities;Therapeutic exercise;Wheelchair mobility training;Balance training;Functional mobility training;Patient/family education    PT Goals (Current goals can be found in the Care Plan section)  Acute Rehab PT Goals Patient Stated Goal: return home PT Goal Formulation: With patient Time For Goal Achievement: 11/28/22 Potential to Achieve Goals: Good    Frequency Min 3X/week     Co-evaluation               AM-PAC PT "6 Clicks" Mobility  Outcome Measure Help needed turning from your back to your side while in a flat bed without using  bedrails?: A Lot Help needed moving from lying on your back to sitting on the side of a flat bed without using bedrails?: A Lot Help needed moving to and from a bed to a chair (including a wheelchair)?: A Lot Help needed standing up from a chair using your arms (e.g., wheelchair or bedside chair)?: A Lot Help needed to walk in hospital room?: A Lot Help needed climbing 3-5 steps with a railing? : Total 6 Click Score: 11    End of Session   Activity Tolerance: Patient tolerated treatment well;Patient limited by fatigue Patient left: in bed;with call bell/phone within reach Nurse Communication: Mobility status PT Visit Diagnosis: Unsteadiness on feet (R26.81);Other abnormalities of gait and mobility (R26.89);Muscle weakness (generalized) (M62.81)    Time: 6295-2841 PT Time Calculation (min) (ACUTE ONLY): 14 min   Charges:   PT Evaluation $PT Eval Low Complexity: 1 Low PT Treatments $Therapeutic Activity: 8-22 mins        2:03 PM, 11/14/22 Ocie Bob, MPT Physical Therapist with [facility] [phone] office [phone] mobile phone

## 2022-11-14 NOTE — Consult Note (Addendum)
Cardiology Consultation   Patient ID: Dustin [name] MRN: [id number]; DOB: 08-29-1955  Admit date: 11/10/2022 Date of Consult: 11/14/2022  PCP:  Sallyanne Kuster, NP   [facility] Providers Cardiologist:  New to [facility] - Dr. Wyline Mood  Patient Profile:   Dustin [name] is a 67 y.o. male with a hx of colon cancer, iron deficiency anemia and schizophrenia who is being seen 11/14/2022 for the evaluation of bradycardia at the request of Dr. Gwenlyn Perking.  History of Present Illness:   Dustin [name] presented to [facility] ED on 11/10/2022 after having an unwitnessed fall at his group home. He reported generalized weakness and while in the ED, he was noted to be hypothermic with temperature recorded at 92.2 F.  Initial labs showed WBC 4.1, Hgb 12.5, platelets 51 K, Na+ 141, K+ 4.2 and creatinine 1.00. Albumin 3.0. TSH 1.590.  Lactic acid negative. Urine culture was positive for E. coli. CXR showed hyperinflation with chronic interstitial changes.  CT of right hip showed no evidence of fracture or dislocation. EKG showed sinus bradycardia, heart rate 40 with first-degree AV block.  He was admitted for sepsis in the setting of UTI and started on antibiotic therapy. Blood cultures have resulted positive for Staphylococcus epidermidis with antibiotic therapy being adjusted. He did have hypotension initially and required pressor support with Norepinephrine being discontinued on 11/11/2022. His hospitalization has been complicated by AMS as he has refused medications and labs at times. He initially refused an echocardiogram but this was able to be obtained yesterday and showed a preserved EF of 55 to 60% with no regional wall motion abnormalities. RV function was normal and he did have moderate biatrial dilatation with no significant valve abnormalities.  In talking with the patient today, he appears paranoid and initially was using profanity upon entering the room but was then appropriate and answering  questions. He denies any symptoms at this time. Getting ready to have breakfast. He denies any current pain, dyspnea, dizziness or presyncope. Says he fell the day of admission but does not recall having a syncopal episode. Notes mention he has experienced lethargy and decreased responsiveness this admission but occurred in the setting of having received Haldol and Ativan.   Past Medical History:  Diagnosis Date   Schizophrenia [facility])     Past Surgical History:  Procedure Laterality Date   COLONOSCOPY N/A 10/06/2021   Procedure: COLONOSCOPY;  Surgeon: Toledo, Boykin Nearing, MD;  Location: [facility] ENDOSCOPY;  Service: Gastroenterology;  Laterality: N/A;   ESOPHAGOGASTRODUODENOSCOPY N/A 10/06/2021   Procedure: ESOPHAGOGASTRODUODENOSCOPY (EGD);  Surgeon: Toledo, Boykin Nearing, MD;  Location: [facility] ENDOSCOPY;  Service: Gastroenterology;  Laterality: N/A;   PARTIAL COLECTOMY  10/13/2021   Procedure: RIGHT COLECTOMY;  Surgeon: Leafy Ro, MD;  Location: [facility] ORS;  Service: General;;     Home Medications:  Prior to Admission medications   Medication Sig Start Date End Date Taking? Authorizing Provider  haloperidol decanoate (HALDOL DECANOATE) 100 MG/ML injection Inject 1 mL (100 mg total) into the muscle once for 1 dose. Next dose due 5/15 Patient taking differently: Inject 100 mg into the muscle every 28 (twenty-eight) days. 07/16/18 11/10/22 Yes [name], Jackquline Denmark, MD  INVEGA SUSTENNA 234 MG/1.5ML injection Inject into the muscle. 12/01/21  Yes [provider]  risperiDONE (RISPERDAL) 0.5 MG tablet Take 0.5 mg by mouth 2 (two) times daily. 10/25/22  Yes [provider]    Inpatient Medications: Scheduled Meds:  Chlorhexidine Gluconate Cloth  6 each Topical Daily  midodrine  5 mg Oral TID WC   nicotine  14 mg Transdermal Daily   risperiDONE  0.5 mg Oral BID   Continuous Infusions:  sodium chloride     cefTRIAXone (ROCEPHIN)  IV Stopped (11/13/22 1014)   dextrose 5 % and 0.9 % NaCl 75 mL/hr  at 11/14/22 0622   norepinephrine (LEVOPHED) Adult infusion Stopped (11/11/22 1431)   vancomycin Stopped (11/13/22 1613)   PRN Meds: acetaminophen **OR** acetaminophen, ALPRAZolam, haloperidol lactate, ondansetron **OR** ondansetron (ZOFRAN) IV, mouth rinse  Allergies:   No Known Allergies  Social History:   Social History   Socioeconomic History   Marital status: Single    Spouse name: Not on file   Number of children: Not on file   Years of education: Not on file   Highest education level: Not on file  Occupational History   Not on file  Tobacco Use   Smoking status: Every Day    Packs/day: 0.50    Years: 15.00    Additional pack years: 0.00    Total pack years: 7.50    Types: Cigarettes   Smokeless tobacco: Never  Vaping Use   Vaping Use: Never used  Substance and Sexual Activity   Alcohol use: Not Currently   Drug use: No   Sexual activity: Never    Birth control/protection: None  Other Topics Concern   Not on file  Social History Narrative   Not on file   Social Determinants of Health   Financial Resource Strain: Not on file  Food Insecurity: Not on file  Transportation Needs: Not on file  Physical Activity: Not on file  Stress: Not on file  Social Connections: Not on file  Intimate Partner Violence: Not At Risk (07/21/2022)   Humiliation, Afraid, Rape, and Kick questionnaire    Fear of Current or Ex-Partner: No    Emotionally Abused: No    Physically Abused: No    Sexually Abused: No    Family History:    Family History  Problem Relation Age of Onset   Lung cancer Father    Hypertension Brother    Hypertension Brother    Hypertension Brother    Prostate cancer Brother      ROS:  Please see the history of present illness.   All other ROS reviewed and negative.     Physical Exam/Data:   Vitals:   11/14/22 0418 11/14/22 0500 11/14/22 0600 11/14/22 0709  BP: 114/70 (!) 142/70 (!) 148/118   Pulse: (!) 38 (!) 41 (!) 43 (!) 40  Resp: 12 10 19  14   Temp:    (!) 97.2 F (36.2 C)  TempSrc:    Rectal  SpO2: 96% 90% 97% 96%  Weight:  68.6 kg    Height:        Intake/Output Summary (Last 24 hours) at 11/14/2022 0749 Last data filed at 11/14/2022 0622 Gross per 24 hour  Intake 2178.24 ml  Output 1350 ml  Net 828.24 ml      11/14/2022    5:00 AM 11/13/2022    4:26 AM 11/11/2022    5:39 AM  Last 3 Weights  Weight (lbs) 151 lb 3.8 oz 151 lb 7.3 oz 151 lb 7.3 oz  Weight (kg) 68.6 kg 68.7 kg 68.7 kg     Body mass index is 23.69 kg/m.  General:  Thin male appearing in no acute distress. HEENT: normal Neck: no JVD Vascular: No carotid bruits; Distal pulses 2+ bilaterally Cardiac:  normal S1, S2; regular  rhythm, bradycardiac rate.  Lungs:  clear to auscultation bilaterally, no wheezing, rhonchi or rales  Abd: soft, nontender, no hepatomegaly  Ext: no edema Musculoskeletal:  No deformities, BUE and BLE strength normal and equal Skin: warm and dry  Neuro:  CNs 2-12 intact, no focal abnormalities noted Psych:  Normal affect   Telemetry:  Telemetry was personally reviewed and demonstrates: Sins bradycardia, HR in 30's to 50's with pauses up to 2.6 seconds and occasional non-conducted p-waves.   Relevant CV Studies:  Echocardiogram: 11/13/2022 IMPRESSIONS     1. Left ventricular ejection fraction, by estimation, is 55 to 60%. The  left ventricle has normal function. The left ventricle has no regional  wall motion abnormalities. Left ventricular diastolic parameters were  normal.   2. Right ventricular systolic function is normal. The right ventricular  size is normal. There is normal pulmonary artery systolic pressure.   3. Left atrial size was moderately dilated.   4. Right atrial size was moderately dilated.   5. The mitral valve is normal in structure. No evidence of mitral valve  regurgitation. No evidence of mitral stenosis.   6. The tricuspid valve is abnormal.   7. The aortic valve is tricuspid. Aortic valve  regurgitation is not  visualized. No aortic stenosis is present.   8. The inferior vena cava is dilated in size with >50% respiratory  variability, suggesting right atrial pressure of 8 mmHg.   Laboratory Data:  High Sensitivity Troponin:  No results for input(s): "TROPONINIHS" in the last 720 hours.   Chemistry Recent Labs  Lab 11/10/22 1126 11/10/22 1446 11/11/22 0305 11/13/22 0437  NA 141  --  143 141  K 4.2  --  4.2 3.8  CL 107  --  111 110  CO2 27  --  25 23  GLUCOSE 69*  --  44* 64*  BUN 15  --  16 14  CREATININE 1.00  --  1.19 1.16  CALCIUM 9.6  --  9.0 8.9  MG 1.8 1.8  --   --   GFRNONAA >60  --  >60 >60  ANIONGAP 7  --  7 8    Recent Labs  Lab 11/10/22 1126  PROT 6.2*  ALBUMIN 3.0*  AST 22  ALT 24  ALKPHOS 125  BILITOT 0.6   Lipids No results for input(s): "CHOL", "TRIG", "HDL", "LABVLDL", "LDLCALC", "CHOLHDL" in the last 168 hours.  Hematology Recent Labs  Lab 11/10/22 1126 11/11/22 0305  WBC 3.1* 4.1  RBC 4.72 4.33  HGB 12.5* 11.4*  HCT 40.0 37.0*  MCV 84.7 85.5  MCH 26.5 26.3  MCHC 31.3 30.8  RDW 20.9* 20.4*  PLT 51* 52*   Thyroid  Recent Labs  Lab 11/10/22 1126  TSH 1.590    BNPNo results for input(s): "BNP", "PROBNP" in the last 168 hours.  DDimer No results for input(s): "DDIMER" in the last 168 hours.   Radiology/Studies:  CT Hip Right Wo Contrast  Result Date: 11/10/2022 CLINICAL DATA:  Hip trauma, fracture suspected.  Unwitnessed fall. EXAM: CT OF THE RIGHT HIP WITHOUT CONTRAST TECHNIQUE: Multidetector CT imaging of the right hip was performed according to the standard protocol. Multiplanar CT image reconstructions were also generated. RADIATION DOSE REDUCTION: This exam was performed according to the departmental dose-optimization program which includes automated exposure control, adjustment of the mA and/or kV according to patient size and/or use of iterative reconstruction technique. COMPARISON:  Radiograph performed earlier on the  same date. FINDINGS: Bones/Joint/Cartilage No evidence of  fracture or dislocation. No appreciable joint effusion. Mild superolateral hip joint space narrowing with marginal spurring suggesting mild osteoarthritis. Sacroiliac joint and pubic symphysis are intact. Pubic bones are intact. Ligaments Suboptimally assessed by CT. Muscles and Tendons Muscles are normal in bulk. No intramuscular hematoma or fluid collection. Tendons of the flexor, extensor and adductor compartments are intact. Soft tissues Vascular calcifications. Skin and subcutaneous soft tissues are otherwise within normal limits. Pelvic viscera: Colonic diverticulosis without evidence of acute diverticulitis. IMPRESSION: 1. No evidence of fracture or dislocation. 2. Mild osteoarthritis of the right hip. 3. Muscles and tendons are intact. 4. Colonic diverticulosis without evidence of acute diverticulitis. 5. Vascular calcifications. Electronically Signed   By: Larose Hires D.O.   On: 11/10/2022 16:22   DG Shoulder Right  Result Date: 11/10/2022 CLINICAL DATA:  Weakness.  Fall EXAM: RIGHT SHOULDER - 3 VIEW COMPARISON:  None Available. FINDINGS: Osteopenia. Slight degenerative changes along the [facility] joint with well corticated ossific density superiorly. Small osteophytes. Preserved glenohumeral joint. No fracture or dislocation. IMPRESSION: Osteopenia.  Slight degenerative changes of the [facility] joint. Electronically Signed   By: Karen Kays M.D.   On: 11/10/2022 12:31   DG Hip Unilat With Pelvis 2-3 Views Right  Result Date: 11/10/2022 CLINICAL DATA:  Pain after fall EXAM: DG HIP (WITH OR WITHOUT PELVIS) 3V RIGHT COMPARISON:  None Available. FINDINGS: On one view there is slight irregularity of the left femoral neck cortex and trabecula. Mild joint space loss of the hip joints and sacroiliac joints. Hyperostosis. There is however osteopenia. With this level of osteopenia a subtle nondisplaced injury is difficult to completely exclude and if needed  additional cross-sectional study as clinically directed. Scattered vascular calcifications. The extreme upper aspect of the iliac crests are obscured by overlapping structures. IMPRESSION: On one view there is slight cortical irregularity and trabecula of the left femoral neck. Subtle injury is difficult to exclude. Recommend cross-sectional study to exclude injury when clinically appropriate. Mild degenerative changes.  Osteopenia Electronically Signed   By: Karen Kays M.D.   On: 11/10/2022 12:30   DG Chest Port 1 View  Result Date: 11/10/2022 CLINICAL DATA:  Weakness.  Fall EXAM: PORTABLE CHEST 1 VIEW COMPARISON:  Chest x-ray 07/20/2022 and CT scan FINDINGS: Hyperinflation. Stable cardiopericardial silhouette with calcified and tortuous aorta. Interstitial changes are again seen. The hazy opacity along the left hemithorax is slightly improving on the current exam. Overlapping cardiac leads and defibrillator pads. Film is rotated to the left. Question some subglottic narrowing. IMPRESSION: Hyperinflation with chronic interstitial changes. Slight asymmetric opacity in the left hemithorax is decreased today. Question subglottic narrowing on the frontal view. Please correlate with symptoms and additional evaluation to confirm when clinically appropriate Electronically Signed   By: Karen Kays M.D.   On: 11/10/2022 12:28     Assessment and Plan:   1. Bradycardia - He does have prior EKG's showing bradycardia with HR in the 50's at times but more pronounced this admission with rates in the 30's to 40's. By review of telemetry, he does have an occasional non-conducted p-wave but no high-grade AV block. Pauses up to 2.6 seconds. By review of Clinical Key, Risperidone can cause bradycardia and tachycardia but this appears to be a long-standing medication for him and this typically occurs upon initiation.  - Suspect his bradycardia is secondary to his acute illness. Electrolytes and TSH WNL. Echo without  significant abnormalities. At this time, would continue to avoid AV nodal blocking agents. Would check HR  with ambulation once out-of-bed. Would anticipate a 2-week ZIO patch at the time of discharge but unsure if he would be complaint with this.   2. Hypotension - Initially required pressor support but this has been weaned. Ordered to receive Midodrine 5mg  TID. Given SBP in the 160's, will reduce to 2.5mg  TID.   3. Sepsis due to UTI/Bacteremia - Improving and no recurrent hypothermia. Remains on Rocephin and Vancomycin. Management per the admitting team.   4. Schizophrenia - Episodes of AMS and paranoia this admission. He does have a Legal Guardian by review of records.    For questions or updates, please contact [facility] Please consult [email or web address] for contact info under    Signed, Ellsworth Lennox, PA-C  11/14/2022 7:49 AM  Attending note Patient seen and discussed with PA [place] Ouch, I agree with her documentation. 67 yo male history of schizophrenia, colon CA admitted with unwitnessed fall and weakness. Found to be hypothermic and bradycardic in ER, workup showed evidenc eof UTI and sepsis along with bacteremia. Admitted to medicine team. Cardiology consulted for bradycardia   Lactic acid 0.7 K 4.2 Cr 1 BUN 15 WBC 3.1 Hgb 12.5 Plt 51 TSH 1.5 Mg 1.8 EKG sinus brady 40, first degree av block CXR: no acute process Echo: LVE 55-60%, no WMAs, normal diastolic, normal RV    1.Bradycardia - EKG sinus brady 40 first degree block long PR 350. Presented hypothermic, septic - tele sinus brady to 30s at times but not sustained, sinus pauses up to 3.7 seconds. No evidence of high grade block.  - not on any av nodal agents.  - risperdal some associated risk of bradycardia but does not appear to be common and typically at higher doses.  - severe schizophrenia, unable to collect any coherent history as far as possible symptoms.   - follow rates for now as sepsis and  hypothermia, if persistent may need to consider trial off risperdal. No indication for pacemaker placement right now, certaintly with bacteremia would also not be a current candidate.  - if persistent bradycardia and normal bp could start low dose dobutamine, if persistent bradycardia and hypotensive could start peripheral dopamine.[name]  2.Sepsis/UTI/bacteremia - per primary team   Dina Rich MD

## 2022-11-14 NOTE — TOC Progression Note (Signed)
30 Day note   Patient Details  Name: Dustin [name] MRN: [id number] Date of Birth: Mar 01, 1956  Transition of Care [facility]) CM/SW Contact  Leitha Bleak, RN Phone Number: 11/14/2022, 12:24 PM      To whom it May Concern: Please be advised that the above name patient will require a short-term nursing home stay- anticipated 30 days or less rehabilitation and strengthening. The plan is for return to group home.

## 2022-11-15 DIAGNOSIS — R001 Bradycardia, unspecified: Secondary | ICD-10-CM | POA: Diagnosis not present

## 2022-11-15 LAB — BASIC METABOLIC PANEL
Anion gap: 7 (ref 5–15)
BUN: 15 mg/dL (ref 8–23)
CO2: 23 mmol/L (ref 22–32)
Calcium: 8.8 mg/dL — ABNORMAL LOW (ref 8.9–10.3)
Chloride: 110 mmol/L (ref 98–111)
Creatinine, Ser: 1.13 mg/dL (ref 0.61–1.24)
GFR, Estimated: >60 mL/min (ref >60)
Glucose, Bld: 105 mg/dL — ABNORMAL HIGH (ref 70–99)
Potassium: 3.2 mmol/L — ABNORMAL LOW (ref 3.5–5.1)
Sodium: 140 mmol/L (ref 135–145)

## 2022-11-15 LAB — CBC
HCT: 37 % — ABNORMAL LOW (ref 39.0–52.0)
Hemoglobin: 11.5 g/dL — ABNORMAL LOW (ref 13.0–17.0)
MCH: 26.4 pg (ref 26.0–34.0)
MCHC: 31.1 g/dL (ref 30.0–36.0)
MCV: 84.9 fL (ref 80.0–100.0)
Platelets: 70 10*3/uL — ABNORMAL LOW (ref 150–400)
RBC: 4.36 MIL/uL (ref 4.22–5.81)
RDW: 19.9 % — ABNORMAL HIGH (ref 11.5–15.5)
WBC: 3.7 10*3/uL — ABNORMAL LOW (ref 4.0–10.5)
nRBC: 0 % (ref 0.0–0.2)

## 2022-11-15 LAB — PHOSPHORUS: Phosphorus: 2.8 mg/dL (ref 2.5–4.6)

## 2022-11-15 LAB — CULTURE, BLOOD (ROUTINE X 2): Culture: NO GROWTH

## 2022-11-15 LAB — GLUCOSE, CAPILLARY
Glucose-Capillary: 322 mg/dL — ABNORMAL HIGH (ref 70–99)
Glucose-Capillary: 59 mg/dL — ABNORMAL LOW (ref 70–99)
Glucose-Capillary: 72 mg/dL (ref 70–99)
Glucose-Capillary: 82 mg/dL (ref 70–99)
Glucose-Capillary: 95 mg/dL (ref 70–99)

## 2022-11-15 LAB — CORTISOL: Cortisol, Plasma: 9.6 ug/dL

## 2022-11-15 LAB — MAGNESIUM: Magnesium: 1.8 mg/dL (ref 1.7–2.4)

## 2022-11-15 MED ORDER — POTASSIUM CHLORIDE 10 MEQ/100ML IV SOLN
10.0000 meq | INTRAVENOUS | Status: AC
  Administered 2022-11-15 (×4): 10 meq via INTRAVENOUS
  Filled 2022-11-15: qty 100

## 2022-11-15 MED ORDER — SODIUM CHLORIDE 0.9 % IV SOLN: INTRAVENOUS | Status: DC

## 2022-11-15 MED ORDER — MAGNESIUM SULFATE 2 GM/50ML IV SOLN
2.0000 g | Freq: Once | INTRAVENOUS | Status: AC
  Administered 2022-11-15: 2 g via INTRAVENOUS
  Filled 2022-11-15: qty 50

## 2022-11-15 MED ORDER — POTASSIUM CHLORIDE CRYS ER 20 MEQ PO TBCR
40.0000 meq | EXTENDED_RELEASE_TABLET | Freq: Once | ORAL | Status: AC
  Administered 2022-11-15: 40 meq via ORAL
  Filled 2022-11-15: qty 2

## 2022-11-15 NOTE — BH Assessment (Addendum)
@   10:12, Clinician requested [place], RN, to set up the TTS machine in the patient's room for his initial assessment. Beforehand, I inquired about the reason for the consult. Dr. Hortencia Conradi clarified that the consult was placed to address medication concerns due to possible bradycardia from risperidone. Hillery Jacks, NP/Dr. Cinderella, was made aware of the request and will address this matter."

## 2022-11-15 NOTE — TOC Progression Note (Addendum)
Transition of Care [facility]) - Progression Note    Patient Details  Name: Dustin [name] MRN: [id number] Date of Birth: [birth year]  Transition [facility] Care [facility]) CM/SW Contact  Leitha Bleak, RN Phone Number: 11/15/2022, 10:19 AM  Clinical Narrative:   Expanding SNF search for beds. No offers. Per MD needs 2 more days. INS AUTH started.     Expected Discharge Plan: Skilled Nursing Facility Barriers to Discharge: Continued Medical Work up  Expected Discharge Plan and Services     Social Determinants of Health (SDOH) Interventions SDOH Screenings   Housing: High Risk (07/21/2022)  Alcohol Screen: Low Risk  (03/06/2022)  Depression (PHQ2-9): Low Risk  (03/06/2022)  Tobacco Use: High Risk (11/10/2022)    Readmission Risk Interventions     No data to display

## 2022-11-15 NOTE — Progress Notes (Signed)
Disposition: Per Hillery Jacks ,NP pt is psych cleared.This CSW will now remove this pt from the [facility] shift report. TOC to assist if there is an identified discharge need.   Maryjean Ka, MSW, [facility] 11/15/2022 10:44 PM

## 2022-11-15 NOTE — Progress Notes (Addendum)
PROGRESS NOTE    Dustin [name]  ZOX:[id number] DOB: Mar 01, 1956 DOA: 11/10/2022 PCP: Sallyanne Kuster, NP   Brief Narrative: 67 year old with past medical history significant for schizophrenia, colon cancer, tobacco abuse, anemia presented to hospital secondary to unwitnessed mechanical fall and generalized weakness.  Workup consistent with hypothermia, bradycardia and leukopenia.  Urinalysis suggestive of UTI.  Patient admitted with sepsis criteria.  Patient has been noticed to have bradycardia asymptomatic, cardiology has been following.  Psych has been consulted for recommendation for medication management for schizophrenia due to concern with risperidone in the setting of bradycardia.     Assessment & Plan:   Principal Problem:   Sepsis secondary to UTI [facility]) Active Problems:   Schizoaffective disorder, bipolar type (HCC)   Tobacco use disorder   Malignant neoplasm of ascending colon (HCC)   Pressure injury of skin   Bacteremia  1-Sepsis secondary to UTI Patient presents with leukopenia, hypothermia, source of infection urinary tract.  Initially some concern for staph epi bacteremia but subsequently discussed with ID and this was likely a contaminant. -urine culture: E. coli -Continue with IV ceftriaxone, he will need a total of 10 days treatment  Bradycardia: Appears to be asymptomatic.  Thought to be in the setting of hypothermia, risperidone. Will check Phosphorus, Mg.  Replete potassium and Mg.  TSH normal on admission.  Check cortisol.   Schizoaffective disorder, bipolar: Plan to hold risperidone to see if that will help with bradycardia He received his injection for [facility] facility Psych consulted to adjust medications  1 out of 4 staph epi blood cultures positive: Echo negative for signs of endocarditis Dr. Gwenlyn Perking discussed with Dr. Ilsa Iha and blood cultures are likely contaminate  Tobacco use disorder: Continue with nicotine patch Pressure injury: Left  hip stage II: Present on admission: Continue with local care  Malignant neoplasm of ascending colon: Follow up as an outpatient with oncology.  Thrombocytopenia continue to monitor Hypotension; Started on Midodrine.  Check cortisol.    See wound care documentation below Pressure Injury 11/10/22 Hip Left Stage 2 -  Partial thickness loss of dermis presenting as a shallow open injury with a red, pink wound bed without slough. (Active)  11/10/22 1535  Location: Hip  Location Orientation: Left  Staging: Stage 2 -  Partial thickness loss of dermis presenting as a shallow open injury with a red, pink wound bed without slough.  Wound Description (Comments):   Present on Admission: Yes  Dressing Type Foam - Lift dressing to assess site every shift 11/15/22 0750      Estimated body mass index is 23.69 kg/m as calculated from the following:   Height as of this encounter: 5\' 7"  (1.702 m).   Weight as of this encounter: 68.6 kg.   DVT prophylaxis: SCD, no blood thinner due to thrombocytopenia.  Code Status: Full code Family Communication: care discussed with patient.  Disposition Plan:  Status is: Inpatient Remains inpatient appropriate because: management of hypothermia.     Consultants:  Psych  Cardiology   Procedures:    Antimicrobials:    Subjective: He is alert, denies pain, he is very hard of hearing. Denies dyspnea.   Objective: Vitals:   11/15/22 1125 11/15/22 1222 11/15/22 1300 11/15/22 1400  BP:  (!) 133/100 109/60 (!) 127/52  Pulse:  (!) 45 (!) 39 (!) 44  Resp:  12 11 11   Temp: 98.4 F (36.9 C)     TempSrc: Rectal     SpO2:  97% 93% 96%  Weight:  Height:        Intake/Output Summary (Last 24 hours) at 11/15/2022 1520 Last data filed at 11/15/2022 1501 Gross per 24 hour  Intake 1777.64 ml  Output 2625 ml  Net -847.36 ml   Filed Weights   11/11/22 0539 11/13/22 0426 11/14/22 0500  Weight: 68.7 kg 68.7 kg 68.6 kg    Examination:  General  exam: Appears calm and comfortable  Respiratory system: Clear to auscultation. Respiratory effort normal. Cardiovascular system: S1 & S2 heard, RRR.  Gastrointestinal system: Abdomen is nondistended, soft and nontender. No organomegaly or masses felt. Central nervous system: Alert and oriented. No focal neurological deficits. Extremities: Symmetric 5 x 5 power.     Data Reviewed: I have personally reviewed following labs and imaging studies  CBC: Recent Labs  Lab 11/10/22 1126 11/11/22 0305 11/15/22 0506  WBC 3.1* 4.1 3.7*  NEUTROABS 2.4  --   --   HGB 12.5* 11.4* 11.5*  HCT 40.0 37.0* 37.0*  MCV 84.7 85.5 84.9  PLT 51* 52* 70*   Basic Metabolic Panel: Recent Labs  Lab 11/10/22 1126 11/10/22 1446 11/11/22 0305 11/13/22 0437 11/15/22 0506  NA 141  --  143 141 140  K 4.2  --  4.2 3.8 3.2*  CL 107  --  111 110 110  CO2 27  --  25 23 23   GLUCOSE 69*  --  44* 64* 105*  BUN 15  --  16 14 15   CREATININE 1.00  --  1.19 1.16 1.13  CALCIUM 9.6  --  9.0 8.9 8.8*  MG 1.8 1.8  --   --  1.8  PHOS  --  2.7  --   --  2.8   GFR: Estimated Creatinine Clearance: 60.1 mL/min (by C-G formula based on SCr of 1.13 mg/dL). Liver Function Tests: Recent Labs  Lab 11/10/22 1126  AST 22  ALT 24  ALKPHOS 125  BILITOT 0.6  PROT 6.2*  ALBUMIN 3.0*   No results for input(s): "LIPASE", "AMYLASE" in the last 168 hours. No results for input(s): "AMMONIA" in the last 168 hours. Coagulation Profile: Recent Labs  Lab 11/10/22 1126  INR 1.1   Cardiac Enzymes: No results for input(s): "CKTOTAL", "CKMB", "CKMBINDEX", "TROPONINI" in the last 168 hours. BNP (last 3 results) No results for input(s): "PROBNP" in the last 8760 hours. HbA1C: No results for input(s): "HGBA1C" in the last 72 hours. CBG: Recent Labs  Lab 11/14/22 0626 11/14/22 1146 11/14/22 1603 11/15/22 0016 11/15/22 1128  GLUCAP 72 123* 80 95 322*   Lipid Profile: No results for input(s): "CHOL", "HDL", "LDLCALC",  "TRIG", "CHOLHDL", "LDLDIRECT" in the last 72 hours. Thyroid Function Tests: No results for input(s): "TSH", "T4TOTAL", "FREET4", "T3FREE", "THYROIDAB" in the last 72 hours. Anemia Panel: No results for input(s): "VITAMINB12", "FOLATE", "FERRITIN", "TIBC", "IRON", "RETICCTPCT" in the last 72 hours. Sepsis Labs: Recent Labs  Lab 11/10/22 1126 11/10/22 1235  LATICACIDVEN 0.7 0.6    Recent Results (from the past 240 hour(s))  Urine Culture     Status: Abnormal   Collection Time: 11/10/22 11:24 AM   Specimen: Urine, Random  Result Value Ref Range Status   Specimen Description   Final    URINE, RANDOM Performed at [facility], [address] [place], [place] [address]    Special Requests   Final    NONE Reflexed from [id number] Performed at [facility], [address], [place], [place] [address]    Culture 80,000 COLONIES/mL ESCHERICHIA COLI (A)  Final  Report Status 11/12/2022 FINAL  Final   Organism ID, Bacteria ESCHERICHIA COLI (A)  Final      Susceptibility   Escherichia coli - MIC*    AMPICILLIN <=2 SENSITIVE Sensitive     CEFAZOLIN <=4 SENSITIVE Sensitive     CEFEPIME <=0.12 SENSITIVE Sensitive     CEFTRIAXONE <=0.25 SENSITIVE Sensitive     CIPROFLOXACIN <=0.25 SENSITIVE Sensitive     GENTAMICIN <=1 SENSITIVE Sensitive     IMIPENEM <=0.25 SENSITIVE Sensitive     NITROFURANTOIN <=16 SENSITIVE Sensitive     TRIMETH/SULFA <=20 SENSITIVE Sensitive     AMPICILLIN/SULBACTAM <=2 SENSITIVE Sensitive     PIP/TAZO <=4 SENSITIVE Sensitive     * 80,000 COLONIES/mL ESCHERICHIA COLI  Blood Culture (routine x 2)     Status: Abnormal   Collection Time: 11/10/22 11:26 AM   Specimen: BLOOD RIGHT HAND  Result Value Ref Range Status   Specimen Description   Final    BLOOD RIGHT HAND BOTTLES DRAWN AEROBIC AND ANAEROBIC Performed at [facility], [address] [place], [place] [address]    Special Requests   Final    Blood Culture adequate volume Performed at [facility], [address] [place], [place] [address]    Culture  Setup Time   Final    GRAM POSITIVE COCCI ANAEROBIC BOTTLE ONLY Gram Stain Report Called to,Read Back By and Verified With: HARRIS K @ 0934 ON [id number] BY HENDERSON L CRITICAL RESULT CALLED TO, READ BACK BY AND VERIFIED WITH: PHARMD FRANK W. [id number] FCP    Culture (A)  Final    STAPHYLOCOCCUS EPIDERMIDIS THE SIGNIFICANCE OF ISOLATING THIS ORGANISM FROM A SINGLE SET OF BLOOD CULTURES WHEN MULTIPLE SETS ARE DRAWN IS UNCERTAIN. PLEASE NOTIFY THE MICROBIOLOGY DEPARTMENT WITHIN ONE WEEK IF SPECIATION AND SENSITIVITIES ARE REQUIRED. Performed at [facility], [address] [place], [place] [address]    Report Status 11/12/2022 FINAL  Final  Blood Culture (routine x 2)     Status: None   Collection Time: 11/10/22 11:26 AM   Specimen: Right Antecubital; Blood  Result Value Ref Range Status   Specimen Description RIGHT ANTECUBITAL BOTTLES DRAWN AEROBIC ONLY  Final   Special Requests   Final    Blood Culture results may not be optimal due to an excessive volume of blood received in culture bottles   Culture   Final    NO GROWTH 5 DAYS Performed at [facility], [address] [place], [place] [address]    Report Status 11/15/2022 FINAL  Final  Blood Culture ID Panel (Reflexed)     Status: Abnormal   Collection Time: 11/10/22 11:26 AM  Result Value Ref Range Status   Enterococcus faecalis NOT DETECTED NOT DETECTED Final   Enterococcus Faecium NOT DETECTED NOT DETECTED Final   Listeria monocytogenes NOT DETECTED NOT DETECTED Final   Staphylococcus species DETECTED (A) NOT DETECTED Final    Comment: CRITICAL RESULT CALLED TO, READ BACK BY AND VERIFIED WITH: PHARMD Robbie Louis. [id number] FCP    Staphylococcus aureus (BCID) NOT DETECTED NOT DETECTED Final   Staphylococcus epidermidis DETECTED (A) NOT DETECTED Final    Comment: Methicillin (oxacillin) resistant coagulase negative staphylococcus. Possible blood culture contaminant (unless  isolated from more than one blood culture draw or clinical case suggests pathogenicity). No antibiotic treatment is indicated for blood  culture contaminants. CRITICAL RESULT CALLED TO, READ BACK BY AND VERIFIED WITH: Tia Masker. [id number] FCP    Staphylococcus lugdunensis NOT DETECTED  NOT DETECTED Final   Streptococcus species NOT DETECTED NOT DETECTED Final   Streptococcus agalactiae NOT DETECTED NOT DETECTED Final   Streptococcus pneumoniae NOT DETECTED NOT DETECTED Final   Streptococcus pyogenes NOT DETECTED NOT DETECTED Final   A.calcoaceticus-baumannii NOT DETECTED NOT DETECTED Final   Bacteroides fragilis NOT DETECTED NOT DETECTED Final   Enterobacterales NOT DETECTED NOT DETECTED Final   Enterobacter cloacae complex NOT DETECTED NOT DETECTED Final   Escherichia coli NOT DETECTED NOT DETECTED Final   Klebsiella aerogenes NOT DETECTED NOT DETECTED Final   Klebsiella oxytoca NOT DETECTED NOT DETECTED Final   Klebsiella pneumoniae NOT DETECTED NOT DETECTED Final   Proteus species NOT DETECTED NOT DETECTED Final   Salmonella species NOT DETECTED NOT DETECTED Final   Serratia marcescens NOT DETECTED NOT DETECTED Final   Haemophilus influenzae NOT DETECTED NOT DETECTED Final   Neisseria meningitidis NOT DETECTED NOT DETECTED Final   Pseudomonas aeruginosa NOT DETECTED NOT DETECTED Final   Stenotrophomonas maltophilia NOT DETECTED NOT DETECTED Final   Candida albicans NOT DETECTED NOT DETECTED Final   Candida auris NOT DETECTED NOT DETECTED Final   Candida glabrata NOT DETECTED NOT DETECTED Final   Candida krusei NOT DETECTED NOT DETECTED Final   Candida parapsilosis NOT DETECTED NOT DETECTED Final   Candida tropicalis NOT DETECTED NOT DETECTED Final   Cryptococcus neoformans/gattii NOT DETECTED NOT DETECTED Final   Methicillin resistance mecA/C DETECTED (A) NOT DETECTED Final    Comment: CRITICAL RESULT CALLED TO, READ BACK BY AND VERIFIED WITH: Tia Masker. [id number]  FCP Performed at [facility], [address] [place], [place] [address]   MRSA Next Gen by PCR, Nasal     Status: None   Collection Time: 11/10/22  3:00 PM   Specimen: Nasal Mucosa; Nasal Swab  Result Value Ref Range Status   MRSA by PCR Next Gen NOT DETECTED NOT DETECTED Final    Comment: (NOTE) The GeneXpert MRSA Assay (FDA approved for NASAL specimens only), is one component of a comprehensive MRSA colonization surveillance program. It is not intended to diagnose MRSA infection nor to guide or monitor treatment for MRSA infections. Test performance is not FDA approved in patients less than 39 years old. Performed at [facility], [address] [place], [place] [address]   Culture, blood (Routine X 2) w Reflex to ID Panel     Status: None (Preliminary result)   Collection Time: 11/13/22  8:59 AM   Specimen: BLOOD LEFT HAND  Result Value Ref Range Status   Specimen Description BLOOD LEFT HAND  Final   Special Requests   Final    BOTTLES DRAWN AEROBIC AND ANAEROBIC Blood Culture adequate volume   Culture   Final    NO GROWTH 2 DAYS Performed at [facility], [address], [place], [place] [address]    Report Status PENDING  Incomplete  Culture, blood (Routine X 2) w Reflex to ID Panel     Status: None (Preliminary result)   Collection Time: 11/13/22  9:15 AM   Specimen: BLOOD RIGHT HAND  Result Value Ref Range Status   Specimen Description BLOOD RIGHT HAND  Final   Special Requests   Final    BOTTLES DRAWN AEROBIC ONLY Blood Culture adequate volume   Culture   Final    NO GROWTH 2 DAYS Performed at [facility], [address] [place], [place] [address]    Report Status PENDING  Incomplete         Radiology  Studies: No results found.      Scheduled Meds:  Chlorhexidine Gluconate Cloth  6 each Topical Daily   midodrine  5 mg Oral TID WC   nicotine  14 mg Transdermal Daily   Continuous Infusions:  sodium chloride     cefTRIAXone (ROCEPHIN)  IV  Stopped (11/15/22 1127)   dextrose 5 % and 0.9 % NaCl 75 mL/hr at 11/15/22 1501     LOS: 5 days    Time spent: 35 minutes    Carly Applegate A Wallace Cogliano, MD [facility]   If 7PM-7AM, please contact night-coverage [email or web address]  11/15/2022, 3:20 PM

## 2022-11-15 NOTE — Progress Notes (Signed)
Rounding Note    Patient Name: Dustin [name] Date of Encounter: 11/15/2022  [facility] Cardiologist: New  Subjective   No complaints  Inpatient Medications    Scheduled Meds:  Chlorhexidine Gluconate Cloth  6 each Topical Daily   midodrine  5 mg Oral TID WC   nicotine  14 mg Transdermal Daily   risperiDONE  0.5 mg Oral BID   Continuous Infusions:  sodium chloride     cefTRIAXone (ROCEPHIN)  IV Stopped (11/14/22 0905)   dextrose 5 % and 0.9 % NaCl 75 mL/hr at 11/15/22 0028   norepinephrine (LEVOPHED) Adult infusion Stopped (11/11/22 1431)   potassium chloride 10 mEq (11/15/22 0741)   PRN Meds: acetaminophen **OR** acetaminophen, ALPRAZolam, haloperidol lactate, ondansetron **OR** ondansetron (ZOFRAN) IV, mouth rinse   Vital Signs    Vitals:   11/15/22 0600 11/15/22 0700 11/15/22 0732 11/15/22 0800  BP: (!) 151/81 (!) 131/59  132/87  Pulse: (!) 37 (!) 36 (!) 45 (!) 37  Resp: 18 11 13 13   Temp:   (!) 96.8 F (36 C)   TempSrc:   Rectal   SpO2: 96% 96% 98% 97%  Weight:      Height:        Intake/Output Summary (Last 24 hours) at 11/15/2022 0811 Last data filed at 11/15/2022 0631 Gross per 24 hour  Intake 996.81 ml  Output 3075 ml  Net -2078.19 ml      11/14/2022    5:00 AM 11/13/2022    4:26 AM 11/11/2022    5:39 AM  Last 3 Weights  Weight (lbs) 151 lb 3.8 oz 151 lb 7.3 oz 151 lb 7.3 oz  Weight (kg) 68.6 kg 68.7 kg 68.7 kg      Telemetry    Sinus brady 30s-50s, pauses at times up to 3.5 seconds - Personally Reviewed  ECG    N/a - Personally Reviewed  Physical Exam   GEN: No acute distress.   Neck: No JVD Cardiac: RRR, no murmurs, rubs, or gallops.  Respiratory: Clear to auscultation bilaterally. GI: Soft, nontender, non-distended  MS: No edema; No deformity. Neuro:  Nonfocal  Psych: Normal affect   Labs    High Sensitivity Troponin:  No results for input(s): "TROPONINIHS" in the last 720 hours.   Chemistry Recent Labs  Lab  11/10/22 1126 11/10/22 1446 11/11/22 0305 11/13/22 0437 11/15/22 0506  NA 141  --  143 141 140  K 4.2  --  4.2 3.8 3.2*  CL 107  --  111 110 110  CO2 27  --  25 23 23   GLUCOSE 69*  --  44* 64* 105*  BUN 15  --  16 14 15   CREATININE 1.00  --  1.19 1.16 1.13  CALCIUM 9.6  --  9.0 8.9 8.8*  MG 1.8 1.8  --   --   --   PROT 6.2*  --   --   --   --   ALBUMIN 3.0*  --   --   --   --   AST 22  --   --   --   --   ALT 24  --   --   --   --   ALKPHOS 125  --   --   --   --   BILITOT 0.6  --   --   --   --   GFRNONAA >60  --  >60 >60 >60  ANIONGAP 7  --  7 8  7    Lipids No results for input(s): "CHOL", "TRIG", "HDL", "LABVLDL", "LDLCALC", "CHOLHDL" in the last 168 hours.  Hematology Recent Labs  Lab 11/10/22 1126 11/11/22 0305 11/15/22 0506  WBC 3.1* 4.1 3.7*  RBC 4.72 4.33 4.36  HGB 12.5* 11.4* 11.5*  HCT 40.0 37.0* 37.0*  MCV 84.7 85.5 84.9  MCH 26.5 26.3 26.4  MCHC 31.3 30.8 31.1  RDW 20.9* 20.4* 19.9*  PLT 51* 52* 70*   Thyroid  Recent Labs  Lab 11/10/22 1126  TSH 1.590    BNPNo results for input(s): "BNP", "PROBNP" in the last 168 hours.  DDimer No results for input(s): "DDIMER" in the last 168 hours.   Radiology    ECHOCARDIOGRAM COMPLETE  Result Date: 11/13/2022    ECHOCARDIOGRAM REPORT   Patient Name:   Dustin [name] Date of Exam: 11/13/2022 Medical Rec #:  [id number]         Height:       67.0 in Accession #:    [id number]        Weight:       151.5 lb Date of Birth:  1956-02-18         BSA:          1.797 m Patient Age:    66 years          BP:           101/63 mmHg Patient Gender: M                 HR:           49 bpm. [facility] Procedure: 2D Echo, Cardiac Doppler and Color Doppler Indications:    Bacteremia R78.81  History:        Patient has no prior history of Echocardiogram examinations.                 Signs/Symptoms:Syncope; Risk Factors:Current Smoker. Hx of                 Schizoaffective disorder, bipolar type (HCC), COVID-19 virus                  infection, Adenocarcinoma of colon (HCC).  Sonographer:    Celesta Gentile RCS Referring Phys: [phone] CARLOS MADERA IMPRESSIONS  1. Left ventricular ejection fraction, by estimation, is 55 to 60%. The left ventricle has normal function. The left ventricle has no regional wall motion abnormalities. Left ventricular diastolic parameters were normal.  2. Right ventricular systolic function is normal. The right ventricular size is normal. There is normal pulmonary artery systolic pressure.  3. Left atrial size was moderately dilated.  4. Right atrial size was moderately dilated.  5. The mitral valve is normal in structure. No evidence of mitral valve regurgitation. No evidence of mitral stenosis.  6. The tricuspid valve is abnormal.  7. The aortic valve is tricuspid. Aortic valve regurgitation is not visualized. No aortic stenosis is present.  8. The inferior vena cava is dilated in size with >50% respiratory variability, suggesting right atrial pressure of 8 mmHg. FINDINGS  Left Ventricle: Left ventricular ejection fraction, by estimation, is 55 to 60%. The left ventricle has normal function. The left ventricle has no regional wall motion abnormalities. The left ventricular internal cavity size was normal in size. There is  no left ventricular hypertrophy. Left ventricular diastolic parameters were normal. Right Ventricle: The right ventricular size is normal. Right vetricular wall thickness was not well visualized. Right ventricular systolic  function is normal. There is normal pulmonary artery systolic pressure. The tricuspid regurgitant velocity is 2.51 m/s, and with an assumed right atrial pressure of 8 mmHg, the estimated right ventricular systolic pressure is 33.2 mmHg. Left Atrium: Left atrial size was moderately dilated. Right Atrium: Right atrial size was moderately dilated. Pericardium: There is no evidence of pericardial effusion. Mitral Valve: The mitral valve is normal in structure. No evidence of  mitral valve regurgitation. No evidence of mitral valve stenosis. Tricuspid Valve: The tricuspid valve is abnormal. Tricuspid valve regurgitation is mild . No evidence of tricuspid stenosis. Aortic Valve: The aortic valve is tricuspid. Aortic valve regurgitation is not visualized. No aortic stenosis is present. Aortic valve mean gradient measures 3.5 mmHg. Aortic valve peak gradient measures 8.1 mmHg. Aortic valve area, by VTI measures 2.19 cm. Pulmonic Valve: The pulmonic valve was not well visualized. Pulmonic valve regurgitation is mild. No evidence of pulmonic stenosis. Aorta: The aortic root is normal in size and structure. Venous: The inferior vena cava is dilated in size with greater than 50% respiratory variability, suggesting right atrial pressure of 8 mmHg. IAS/Shunts: No atrial level shunt detected by color flow Doppler.  LEFT VENTRICLE PLAX 2D LVIDd:         5.00 cm   Diastology LVIDs:         3.10 cm   LV e' medial:    8.70 cm/s LV PW:         1.00 cm   LV E/e' medial:  8.0 LV IVS:        1.00 cm   LV e' lateral:   15.40 cm/s LVOT diam:     2.10 cm   LV E/e' lateral: 4.5 LV SV:         69 LV SV Index:   39 LVOT Area:     3.46 cm  RIGHT VENTRICLE RV S prime:     12.80 cm/s TAPSE (M-mode): 2.4 cm LEFT ATRIUM             Index        RIGHT ATRIUM           Index LA diam:        2.40 cm 1.34 cm/m   RA Area:     21.40 cm LA Vol (A2C):   82.9 ml 46.14 ml/m  RA Volume:   69.20 ml  38.51 ml/m LA Vol (A4C):   75.3 ml 41.91 ml/m LA Biplane Vol: 83.4 ml 46.41 ml/m  AORTIC VALVE AV Area (Vmax):    2.07 cm AV Area (Vmean):   2.25 cm AV Area (VTI):     2.19 cm AV Vmax:           141.88 cm/s AV Vmean:          86.244 cm/s AV VTI:            0.316 m AV Peak Grad:      8.1 mmHg AV Mean Grad:      3.5 mmHg LVOT Vmax:         84.60 cm/s LVOT Vmean:        56.000 cm/s LVOT VTI:          0.200 m LVOT/AV VTI ratio: 0.63  AORTA Ao Root diam: 3.70 cm MITRAL VALVE               TRICUSPID VALVE MV Area (PHT): 4.60 cm     TR Peak grad:   25.2 mmHg MV Decel  Time: 165 msec    TR Vmax:        251.00 cm/s MV E velocity: 69.70 cm/s MV A velocity: 38.80 cm/s  SHUNTS MV E/A ratio:  1.80        Systemic VTI:  0.20 m                            Systemic Diam: 2.10 cm Dina Rich MD Electronically signed by Dina Rich MD Signature Date/Time: 11/13/2022/12:12:08 PM    Final     Cardiac Studies    Patient Profile     Dustin [name] is a 67 y.o. male with a hx of colon cancer, iron deficiency anemia and schizophrenia who is being seen 11/14/2022 for the evaluation of bradycardia at the request of Dr. Gwenlyn Perking.   Assessment & Plan    1.Bradycardia - EKG sinus brady 40 first degree block long PR 350. Presented hypothermic, septic - tele sinus brady to 30s at times but not sustained, sinus pauses up to 3.7 seconds. No evidence of high grade block.  - not on any av nodal agents.  - risperdal some associated risk of bradycardia but does not appear to be common and typically at higher doses.  - severe schizophrenia, unable to collect any coherent history as far as possible symptoms.    - follow rates for now as sepsis and hypothermia resolve, if persistent may need to consider trial off risperdal. No indication for pacemaker placement right now, certaintly with bacteremia would also not be a current candidate.  - if persistent bradycardia and normal bp could start low dose dobutamine, if persistent bradycardia and hypotensive could start peripheral dopamine..  - tele sinus brady 30s to 50s, pauses at times 3.5 seconds. BP's have been stable without any clear symptoms - essentially watch rhythm as he recovers from sepsis, hypothermia resolves.     2.Sepsis/UTI/bacteremia/hypothermia - per primary team  For questions or updates, please contact [facility] Please consult [email or web address] for contact info under        Signed, Dina Rich, MD  11/15/2022, 8:11 AM

## 2022-11-15 NOTE — Consult Note (Signed)
Telepsych Consultation   Reason for Consult:  " Management of medication change Risperidone due to bradycardia." Referring Physician: Hartley Barefoot MD Location of Patient:  [phone] Location of Provider: Other: [facility]  Patient Identification: Dustin [name] MRN:  [id number] Principal Diagnosis: Sepsis secondary to UTI [facility]) Diagnosis:  Principal Problem:   Sepsis secondary to UTI [facility]) Active Problems:   Tobacco use disorder   Schizoaffective disorder, bipolar type (HCC)   Malignant neoplasm of ascending colon (HCC)   Pressure injury of skin   Bacteremia   Total Time spent with patient: 15 minutes  Subjective:   Dustin [name] is a 67 y.o. male was seen and evaluated via video teleassessment.  Psychiatric consult was placed due to medication management with consideration of changing respite all due to bradycardia.  Dustin [name] is awake, alert and oriented to person and  place. Dustin [name] has a charted history with schizoaffective disorder, bipolar type, major depression and generalized anxiety disorder.  Patient is currently prescribed Risperidone 0.5 mg twice daily, Xanax 0.25 mg and Haloperidol Decanoate. (Unknown last dose.)  Patient patient reports his brother manages his medications.  He is unsure who prescribes his medication at this time.  Patient unable to recall if he is followed by therapy or psychiatry currently.  He is denying suicidal or homicidal ideations.  Denies auditory visual hallucinations.  Presents slightly disorganized but pleasant throughout this assessment.   This provider consulted with attending psychiatrist Cinderella who recommends holding Risperdal 0.5 mg and continuing to monitor heart rate.  No adjustments to psychotropic medications at this time. Per attending this is "unlikely"  the cause of patient's presenting symptoms.  As stated all antipsychotics have bradycardia listed as possible side effects.  Patient to be continued with medical  workup.   During evaluation Dustin [name] is resting in bed leaning to left side; it was reported that patient is hearing impaired.Dustin [name] is alert/oriented  x 2. calm/cooperative; and mood congruent with affect.  Patient is speaking in a clear tone at moderate volume, and normal pace; with good eye contact.  His  thought process is coherent and relevant; There is no indication that he is currently responding to internal/external stimuli. Patient denies suicidal/self-harm/homicidal ideation, psychosis, and paranoia.  Patient has remained calm throughout assessment and has answered questions appropriately.   HPI:  Per admission assessment note: " Dustin [name] is a 67 y.o. male.  He is brought in by EMS for possible code sepsis.  He reportedly had a fall overnight at his facility and was put back to bed.  This morning is complaining of right shoulder and right hip pain.  EMS noted blood pressure low, bradycardic, low temperature."  Past Psychiatric History: See HPI  Risk to Self:   Risk to Others:   Prior Inpatient Therapy:   Prior Outpatient Therapy:    Past Medical History:  Past Medical History:  Diagnosis Date   Schizophrenia [facility])     Past Surgical History:  Procedure Laterality Date   COLONOSCOPY N/A 10/06/2021   Procedure: COLONOSCOPY;  Surgeon: Toledo, Boykin Nearing, MD;  Location: [facility] ENDOSCOPY;  Service: Gastroenterology;  Laterality: N/A;   ESOPHAGOGASTRODUODENOSCOPY N/A 10/06/2021   Procedure: ESOPHAGOGASTRODUODENOSCOPY (EGD);  Surgeon: Toledo, Boykin Nearing, MD;  Location: [facility] ENDOSCOPY;  Service: Gastroenterology;  Laterality: N/A;   PARTIAL COLECTOMY  10/13/2021   Procedure: RIGHT COLECTOMY;  Surgeon: Leafy Ro, MD;  Location: [facility] ORS;  Service: General;;   Family History:  Family History  Problem Relation  Age of Onset   Lung cancer Father    Hypertension Brother    Hypertension Brother    Hypertension Brother    Prostate cancer Brother    Family Psychiatric   History:  Social History:  Social History   Substance and Sexual Activity  Alcohol Use Not Currently     Social History   Substance and Sexual Activity  Drug Use No    Social History   Socioeconomic History   Marital status: Single    Spouse name: Not on file   Number of children: Not on file   Years of education: Not on file   Highest education level: Not on file  Occupational History   Not on file  Tobacco Use   Smoking status: Every Day    Packs/day: 0.50    Years: 15.00    Additional pack years: 0.00    Total pack years: 7.50    Types: Cigarettes   Smokeless tobacco: Never  Vaping Use   Vaping Use: Never used  Substance and Sexual Activity   Alcohol use: Not Currently   Drug use: No   Sexual activity: Never    Birth control/protection: None  Other Topics Concern   Not on file  Social History Narrative   Not on file   Social Determinants of Health   Financial Resource Strain: Not on file  Food Insecurity: Not on file  Transportation Needs: Not on file  Physical Activity: Not on file  Stress: Not on file  Social Connections: Not on file   Additional Social History:    Allergies:  No Known Allergies  Labs:  Results for orders placed or performed during the hospital encounter of 11/10/22 (from the past 48 hour(s))  Glucose, capillary     Status: Abnormal   Collection Time: 11/13/22 11:55 AM  Result Value Ref Range   Glucose-Capillary 124 (H) 70 - 99 mg/dL    Comment: Glucose reference range applies only to samples taken after fasting for at least 8 hours.  Glucose, capillary     Status: None   Collection Time: 11/13/22  6:12 PM  Result Value Ref Range   Glucose-Capillary 95 70 - 99 mg/dL    Comment: Glucose reference range applies only to samples taken after fasting for at least 8 hours.  Glucose, capillary     Status: None   Collection Time: 11/13/22 11:48 PM  Result Value Ref Range   Glucose-Capillary 90 70 - 99 mg/dL    Comment: Glucose  reference range applies only to samples taken after fasting for at least 8 hours.   Comment 1 Notify RN    Comment 2 Document in Chart   Glucose, capillary     Status: None   Collection Time: 11/14/22  6:26 AM  Result Value Ref Range   Glucose-Capillary 72 70 - 99 mg/dL    Comment: Glucose reference range applies only to samples taken after fasting for at least 8 hours.  Glucose, capillary     Status: Abnormal   Collection Time: 11/14/22 11:46 AM  Result Value Ref Range   Glucose-Capillary 123 (H) 70 - 99 mg/dL    Comment: Glucose reference range applies only to samples taken after fasting for at least 8 hours.  Glucose, capillary     Status: None   Collection Time: 11/14/22  4:03 PM  Result Value Ref Range   Glucose-Capillary 80 70 - 99 mg/dL    Comment: Glucose reference range applies only to samples taken after  fasting for at least 8 hours.  Glucose, capillary     Status: None   Collection Time: 11/15/22 12:16 AM  Result Value Ref Range   Glucose-Capillary 95 70 - 99 mg/dL    Comment: Glucose reference range applies only to samples taken after fasting for at least 8 hours.   Comment 1 Notify RN    Comment 2 Document in Chart   CBC     Status: Abnormal   Collection Time: 11/15/22  5:06 AM  Result Value Ref Range   WBC 3.7 (L) 4.0 - 10.5 K/uL   RBC 4.36 4.22 - 5.81 MIL/uL   Hemoglobin 11.5 (L) 13.0 - 17.0 g/dL   HCT 16.1 (L) 09.6 - 04.5 %   MCV 84.9 80.0 - 100.0 fL   MCH 26.4 26.0 - 34.0 pg   MCHC 31.1 30.0 - 36.0 g/dL   RDW 40.9 (H) 81.1 - 91.4 %   Platelets 70 (L) 150 - 400 K/uL    Comment: SPECIMEN CHECKED FOR CLOTS Immature Platelet Fraction may be clinically indicated, consider ordering this additional test [id number] CONSISTENT WITH PREVIOUS RESULT REPEATED TO VERIFY    nRBC 0.0 0.0 - 0.2 %    Comment: Performed at [facility], [address] [place], [place] [address]  Basic metabolic panel     Status: Abnormal   Collection Time: 11/15/22  5:06 AM  Result Value  Ref Range   Sodium 140 135 - 145 mmol/L   Potassium 3.2 (L) 3.5 - 5.1 mmol/L   Chloride 110 98 - 111 mmol/L   CO2 23 22 - 32 mmol/L   Glucose, Bld 105 (H) 70 - 99 mg/dL    Comment: Glucose reference range applies only to samples taken after fasting for at least 8 hours.   BUN 15 8 - 23 mg/dL   Creatinine, Ser 7.84 0.61 - 1.24 mg/dL   Calcium 8.8 (L) 8.9 - 10.3 mg/dL   GFR, Estimated >69 >62 mL/min    Comment: (NOTE) Calculated using the CKD-EPI Creatinine Equation (2021)    Anion gap 7 5 - 15    Comment: Performed at [facility] [address] [place], [place] [address]  Magnesium     Status: None   Collection Time: 11/15/22  5:06 AM  Result Value Ref Range   Magnesium 1.8 1.7 - 2.4 mg/dL    Comment: Performed at [facility], [address] [place], [place] [address]  Phosphorus     Status: None   Collection Time: 11/15/22  5:06 AM  Result Value Ref Range   Phosphorus 2.8 2.5 - 4.6 mg/dL    Comment: Performed at [facility], [address], [place], [place] [address]    Medications:  Current Facility-Administered Medications  Medication Dose Route Frequency Provider Last Rate Last Admin   0.9 %  sodium chloride infusion  250 mL Intravenous Continuous Adefeso, Oladapo, DO       acetaminophen (TYLENOL) tablet 650 mg  650 mg Oral Q6H PRN Vassie Loll, MD       Or   acetaminophen (TYLENOL) suppository 650 mg  650 mg Rectal Q6H PRN Vassie Loll, MD       ALPRAZolam Prudy Feeler) tablet 0.25 mg  0.25 mg Oral Q8H PRN Vassie Loll, MD       cefTRIAXone (ROCEPHIN) 2 g in sodium chloride 0.9 % 100 mL IVPB  2 g Intravenous Q24H Vassie Loll, MD 200 mL/hr at 11/15/22 1057 2 g at 11/15/22 1057   Chlorhexidine Gluconate Cloth  2 % PADS 6 each  6 each Topical Daily Vassie Loll, MD   6 each at 11/15/22 0631   dextrose 5 %-0.9 % sodium chloride infusion   Intravenous Continuous Vassie Loll, MD 75 mL/hr at 11/15/22 0028 New Bag at 11/15/22 0028   magnesium sulfate IVPB 2 g 50 mL  2 g  Intravenous Once Regalado, Belkys A, MD 50 mL/hr at 11/15/22 1122 2 g at 11/15/22 1122   midodrine (PROAMATINE) tablet 5 mg  5 mg Oral TID WC Vassie Loll, MD   5 mg at 11/15/22 1102   nicotine (NICODERM CQ - dosed in mg/24 hours) patch 14 mg  14 mg Transdermal Daily Vassie Loll, MD   14 mg at 11/15/22 0805   ondansetron (ZOFRAN) tablet 4 mg  4 mg Oral Q6H PRN Vassie Loll, MD       Or   ondansetron [facility]) injection 4 mg  4 mg Intravenous Q6H PRN Vassie Loll, MD       Oral care mouth rinse  15 mL Mouth Rinse PRN Vassie Loll, MD        Musculoskeletal:  Psychiatric Specialty Exam:  Presentation  General Appearance:  Appropriate for Environment  Eye Contact: Good  Speech: Clear and Coherent  Speech Volume: Normal  Handedness: Right   Mood and Affect  Mood: Euthymic  Affect: Appropriate   Thought Process  Thought Processes: Coherent  Descriptions of Associations:Circumstantial  Orientation:Partial  Thought Content:Delusions  History of Schizophrenia/Schizoaffective disorder:Yes  Duration of Psychotic Symptoms:Greater than six months  Hallucinations:Hallucinations: None  Ideas of Reference:None  Suicidal Thoughts:Suicidal Thoughts: No  Homicidal Thoughts:Homicidal Thoughts: No   Sensorium  Memory: Immediate Fair; Remote Fair  Judgment: Fair  Insight: Fair   Art therapist  Concentration: Fair  Attention Span: Fair  Recall: Good  Fund of Knowledge: Fair  Language: Fair   Psychomotor Activity  Psychomotor Activity:Psychomotor Activity: Other (comment) (patient obsv. resting in bed)   Assets  Assets: Intimacy; Desire for Improvement   Sleep  Sleep:Sleep: Fair    Physical Exam: Physical Exam Vitals and nursing note reviewed.  Constitutional:      Appearance: Normal appearance.  Cardiovascular:     Rate and Rhythm: Normal rate and regular rhythm.  Neurological:     Mental Status: He is alert and  oriented to person, place, and time.  Psychiatric:        Mood and Affect: Mood normal.        Behavior: Behavior normal.        Thought Content: Thought content normal.    ROS Blood pressure (!) 142/81, pulse (!) 37, temperature 98.4 F (36.9 C), temperature source Rectal, resp. rate 13, height 5\' 7"  (1.702 m), weight 68.6 kg, SpO2 97 %. Body mass index is 23.69 kg/m.  Treatment Plan Summary: Daily contact with patient to assess and evaluate symptoms and progress in treatment and Medication management  Consider Holding Risperdal 0.5 mg  and all antipsychotics for today.  Per attending I would not give him haldol dec with a HR In the 40s (or even recent hx of same) .  Consider restarting medication  on 11/16/2022 depending on HR.  Disposition: No evidence of imminent risk to self or others at present.   Patient does not meet criteria for psychiatric inpatient admission. Supportive therapy provided about ongoing stressors. Discussed crisis plan, support from social network, calling 911, coming to the Emergency Department, and calling Suicide Hotline.  This service was provided via telemedicine using a 2-way, interactive  audio and Immunologist.  Names of all persons participating in this telemedicine service and their role in this encounter. Name: Luismanuel Jilek Role: patient   Name: T.Melvyn Neth  Role: NP   Consulted with MD Cinderella        Oneta Rack, NP 11/15/2022 11:27 AM

## 2022-11-16 ENCOUNTER — Encounter: Payer: Self-pay | Admitting: *Deleted

## 2022-11-16 DIAGNOSIS — R001 Bradycardia, unspecified: Secondary | ICD-10-CM | POA: Diagnosis not present

## 2022-11-16 LAB — GLUCOSE, CAPILLARY
Glucose-Capillary: 101 mg/dL — ABNORMAL HIGH (ref 70–99)
Glucose-Capillary: 127 mg/dL — ABNORMAL HIGH (ref 70–99)
Glucose-Capillary: 149 mg/dL — ABNORMAL HIGH (ref 70–99)
Glucose-Capillary: 209 mg/dL — ABNORMAL HIGH (ref 70–99)
Glucose-Capillary: 69 mg/dL — ABNORMAL LOW (ref 70–99)
Glucose-Capillary: 78 mg/dL (ref 70–99)
Glucose-Capillary: 93 mg/dL (ref 70–99)

## 2022-11-16 LAB — BASIC METABOLIC PANEL
Anion gap: 4 — ABNORMAL LOW (ref 5–15)
BUN: 15 mg/dL (ref 8–23)
CO2: 25 mmol/L (ref 22–32)
Calcium: 8.9 mg/dL (ref 8.9–10.3)
Chloride: 111 mmol/L (ref 98–111)
Creatinine, Ser: 1.07 mg/dL (ref 0.61–1.24)
GFR, Estimated: >60 mL/min (ref >60)
Glucose, Bld: 74 mg/dL (ref 70–99)
Potassium: 4 mmol/L (ref 3.5–5.1)
Sodium: 140 mmol/L (ref 135–145)

## 2022-11-16 LAB — CBC
HCT: 37.5 % — ABNORMAL LOW (ref 39.0–52.0)
Hemoglobin: 11.8 g/dL — ABNORMAL LOW (ref 13.0–17.0)
MCH: 26.5 pg (ref 26.0–34.0)
MCHC: 31.5 g/dL (ref 30.0–36.0)
MCV: 84.1 fL (ref 80.0–100.0)
Platelets: 94 10*3/uL — ABNORMAL LOW (ref 150–400)
RBC: 4.46 MIL/uL (ref 4.22–5.81)
RDW: 20 % — ABNORMAL HIGH (ref 11.5–15.5)
WBC: 4 10*3/uL (ref 4.0–10.5)
nRBC: 0 % (ref 0.0–0.2)

## 2022-11-16 LAB — MAGNESIUM: Magnesium: 2.2 mg/dL (ref 1.7–2.4)

## 2022-11-16 MED ORDER — MIDODRINE HCL 5 MG PO TABS
2.5000 mg | ORAL_TABLET | Freq: Three times a day (TID) | ORAL | Status: DC
  Administered 2022-11-16 – 2022-11-22 (×13): 2.5 mg via ORAL
  Filled 2022-11-16 (×15): qty 1

## 2022-11-16 MED ORDER — DEXTROSE IN LACTATED RINGERS 5 % IV SOLN: INTRAVENOUS | Status: DC

## 2022-11-16 MED ORDER — DEXTROSE 50 % IV SOLN
INTRAVENOUS | Status: AC
  Administered 2022-11-16: 25 mL
  Filled 2022-11-16: qty 50

## 2022-11-16 MED ORDER — CEFADROXIL 500 MG PO CAPS
500.0000 mg | ORAL_CAPSULE | Freq: Two times a day (BID) | ORAL | Status: DC
  Administered 2022-11-17: 500 mg via ORAL
  Filled 2022-11-16 (×6): qty 1

## 2022-11-16 NOTE — Progress Notes (Signed)
Physical Therapy Treatment Patient Details Name: Dustin [name] MRN: [id number] DOB: April 02, 1956 Today's Date: 11/16/2022   History of Present Illness Dustin [name] is a 67 y.o. male with medical history significant of schizophrenia, colon cancer, tobacco abuse and anemia; who presented to the hospital secondary to unwitnessed mechanical fall and generalized weakness.  Workup demonstrated the presence of hypothermia, bradycardia and leukopenia; urinalysis suggesting the presence of UTI.  Patient met criteria for sepsis.  Due to underlying history of schizophrenia patient history noncontributory..     Patient reported no chest pain, no nausea, no vomiting, no abdominal pain, no focal weaknesses.  He expressed pain in his right shoulder and right hip area.    PT Comments    Patient presents slightly confused and agreeable for therapy after encouragement.  Patient demonstrates slow labored movement for sitting up at bedside requiring slightly less assistance and HOB flat, able to maintain sitting balance at bedside, very unsteady on feet using RW with frequent buckling of knees and had to sit after a few side steps due to fall risk.  Patient unable to take steps away from bedside due to poor standing balance.  Patient tolerated sitting up in chair after therapy - nursing staff notified.  Patient will benefit from continued skilled physical therapy in hospital and recommended venue below to increase strength, balance, endurance for safe ADLs and gait.      Recommendations for follow up therapy are one component of a multi-disciplinary discharge planning process, led by the attending physician.  Recommendations may be updated based on patient status, additional functional criteria and insurance authorization.  Follow Up Recommendations  Can patient physically be transported by private vehicle: No    Assistance Recommended at Discharge Intermittent Supervision/Assistance  Patient can return home  with the following A lot of help with bathing/dressing/bathroom;A lot of help with walking and/or transfers;Help with stairs or ramp for entrance;Assistance with cooking/housework   Equipment Recommendations  Rolling walker (2 wheels)    Recommendations for Other Services       Precautions / Restrictions Precautions Precautions: Fall Restrictions Weight Bearing Restrictions: No     Mobility  Bed Mobility Overal bed mobility: Needs Assistance Bed Mobility: Supine to Sit     Supine to sit: Mod assist     General bed mobility comments: demonstrated improvement for supine to sitting with HOB flat with slow labored movement    Transfers Overall transfer level: Needs assistance Equipment used: Rolling walker (2 wheels) Transfers: Sit to/from Stand, Bed to chair/wheelchair/BSC Sit to Stand: Mod assist   Step pivot transfers: Mod assist, Max assist       General transfer comment: unsteady laboredd movement with frequent buckling of knees with near fall due to loss of balance    Ambulation/Gait Ambulation/Gait assistance: Max assist Gait Distance (Feet): 4 Feet Assistive device: Rolling walker (2 wheels) Gait Pattern/deviations: Decreased step length - right, Decreased step length - left, Decreased stride length, Trunk flexed, Shuffle, Knees buckling Gait velocity: slow     General Gait Details: limited to a few slow labored unsteady side steps with frequent buckling of knees and had to sit to avoid falling   Stairs             Wheelchair Mobility    Modified Rankin (Stroke Patients Only)       Balance Overall balance assessment: Needs assistance Sitting-balance support: Feet supported, No upper extremity supported Sitting balance-Leahy Scale: Fair Sitting balance - Comments: seated at EOB   Standing  balance support: Reliant on assistive device for balance, During functional activity, Bilateral upper extremity supported Standing balance-Leahy Scale:  Poor Standing balance comment: using RW                            Cognition Arousal/Alertness: Awake/alert Behavior During Therapy: WFL for tasks assessed/performed Overall Cognitive Status: No family/caregiver present to determine baseline cognitive functioning                                          Exercises      General Comments        Pertinent Vitals/Pain Pain Assessment Pain Assessment: No/denies pain    Home Living                          Prior Function            PT Goals (current goals can now be found in the care plan section) Acute Rehab PT Goals Patient Stated Goal: return home PT Goal Formulation: With patient Time For Goal Achievement: 11/28/22 Potential to Achieve Goals: Good Progress towards PT goals: Progressing toward goals    Frequency    Min 3X/week      PT Plan Current plan remains appropriate    Co-evaluation              AM-PAC PT "6 Clicks" Mobility   Outcome Measure  Help needed turning from your back to your side while in a flat bed without using bedrails?: A Lot Help needed moving from lying on your back to sitting on the side of a flat bed without using bedrails?: A Lot Help needed moving to and from a bed to a chair (including a wheelchair)?: A Lot Help needed standing up from a chair using your arms (e.g., wheelchair or bedside chair)?: A Lot Help needed to walk in hospital room?: A Lot Help needed climbing 3-5 steps with a railing? : Total 6 Click Score: 11    End of Session   Activity Tolerance: Patient tolerated treatment well;Patient limited by fatigue Patient left: in chair;with call bell/phone within reach Nurse Communication: Mobility status PT Visit Diagnosis: Unsteadiness on feet (R26.81);Other abnormalities of gait and mobility (R26.89);Muscle weakness (generalized) (M62.81)     Time: 2956-2130 PT Time Calculation (min) (ACUTE ONLY): 26 min  Charges:   $Therapeutic Activity: 23-37 mins                     2:48 PM, 11/16/22 Ocie Bob, MPT Physical Therapist with [facility] [phone] office [phone] mobile phone

## 2022-11-16 NOTE — Progress Notes (Signed)
PROGRESS NOTE    Dustin [name]  BMW:[id number] DOB: May 16, 1956 DOA: 11/10/2022 PCP: Sallyanne Kuster, NP   Brief Narrative: 67 year old with past medical history significant for schizophrenia, colon cancer, tobacco abuse, anemia presented to hospital secondary to unwitnessed mechanical fall and generalized weakness.  Workup consistent with hypothermia, bradycardia and leukopenia.  Urinalysis suggestive of UTI.  Patient admitted with sepsis criteria.  Patient has been noticed to have bradycardia asymptomatic, cardiology has been following.  Psych has been consulted for recommendation for medication management for schizophrenia due to concern with risperidone in the setting of bradycardia.   Assessment & Plan:   Principal Problem:   Sepsis secondary to UTI [facility]) Active Problems:   Schizoaffective disorder, bipolar type (HCC)   Tobacco use disorder   Malignant neoplasm of ascending colon (HCC)   Pressure injury of skin   Bacteremia  1-Sepsis secondary to UTI Patient presents with leukopenia, hypothermia, source of infection urinary tract.  Initially some concern for staph epi bacteremia but subsequently discussed with ID and this was likely a contaminant. -Urine culture: E. coli -Treated  with IV ceftriaxone for 7 days, he will need a total of 10 days treatment. Plan to complete with oral antibiotics.   Bradycardia: Appears to be asymptomatic.  Thought to be in the setting of hypothermia, risperidone. Phosphorus, Mg. Normal.  TSH normal on admission.  Cortisol. At 9. Not low.  Per cardiology, if HR remain persistently low in the next 24 --48 plan is to transfer to [facility] for PE evaluation.   Schizoaffective disorder, bipolar: Plan to hold risperidone to see if that will help with bradycardia He received his injection for H[facility] facility Psych consulted to adjust medications. They will follow up on patient today   1 out of 4 staph epi blood cultures positive: Echo  negative for signs of endocarditis Dr. Gwenlyn Perking discussed with Dr. Ilsa Iha and blood cultures are likely contaminate  Tobacco use disorder: Continue with nicotine patch Pressure injury: Left hip stage II: Present on admission: Continue with local care  Malignant neoplasm of ascending colon: Follow up as an outpatient with oncology.  Thrombocytopenia continue to monitor Hypotension; Started on Midodrine.  Cortisol: 9.    See wound care documentation below Pressure Injury 11/10/22 Hip Left Stage 2 -  Partial thickness loss of dermis presenting as a shallow open injury with a red, pink wound bed without slough. (Active)  11/10/22 1535  Location: Hip  Location Orientation: Left  Staging: Stage 2 -  Partial thickness loss of dermis presenting as a shallow open injury with a red, pink wound bed without slough.  Wound Description (Comments):   Present on Admission: Yes  Dressing Type Foam - Lift dressing to assess site every shift 11/15/22 2000      Estimated body mass index is 23.13 kg/m as calculated from the following:   Height as of this encounter: 5\' 7"  (1.702 m).   Weight as of this encounter: 67 kg.   DVT prophylaxis: SCD, no blood thinner due to thrombocytopenia.  Code Status: Full code Family Communication: care discussed with patient. Brother over phone Disposition Plan:  Status is: Inpatient Remains inpatient appropriate because: management of hypothermia.     Consultants:  Psych  Cardiology   Procedures:    Antimicrobials:    Subjective: He is alert, confuse. Denies pain. He feels tired.  Denies dizziness. He couldn't sleep last night. \ Objective: Vitals:   11/16/22 0523 11/16/22 0600 11/16/22 0700 11/16/22 0743  BP:  (!) 151/98 [name])  136/56   Pulse:      Resp:  11 11   Temp: 97.7 F (36.5 C)   98 F (36.7 C)  TempSrc: Oral   Oral  SpO2:      Weight: 67 kg     Height:        Intake/Output Summary (Last 24 hours) at 11/16/2022 0917 Last data filed  at 11/16/2022 0743 Gross per 24 hour  Intake 2489.16 ml  Output 3400 ml  Net -910.84 ml    Filed Weights   11/13/22 0426 11/14/22 0500 11/16/22 0523  Weight: 68.7 kg 68.6 kg 67 kg    Examination:  General exam: NAD Respiratory system: CTA Cardiovascular system: S1, S 2 RRR Gastrointestinal system: BS present, soft, nt Central nervous system:alert, confuse.  Extremities: no edema     Data Reviewed: I have personally reviewed following labs and imaging studies  CBC: Recent Labs  Lab 11/10/22 1126 11/11/22 0305 11/15/22 0506 11/16/22 0522  WBC 3.1* 4.1 3.7* 4.0  NEUTROABS 2.4  --   --   --   HGB 12.5* 11.4* 11.5* 11.8*  HCT 40.0 37.0* 37.0* 37.5*  MCV 84.7 85.5 84.9 84.1  PLT 51* 52* 70* 94*    Basic Metabolic Panel: Recent Labs  Lab 11/10/22 1126 11/10/22 1446 11/11/22 0305 11/13/22 0437 11/15/22 0506 11/16/22 0522  NA 141  --  143 141 140 140  K 4.2  --  4.2 3.8 3.2* 4.0  CL 107  --  111 110 110 111  CO2 27  --  25 23 23 25   GLUCOSE 69*  --  44* 64* 105* 74  BUN 15  --  16 14 15 15   CREATININE 1.00  --  1.19 1.16 1.13 1.07  CALCIUM 9.6  --  9.0 8.9 8.8* 8.9  MG 1.8 1.8  --   --  1.8 2.2  PHOS  --  2.7  --   --  2.8  --     GFR: Estimated Creatinine Clearance: 63.5 mL/min (by C-G formula based on SCr of 1.07 mg/dL). Liver Function Tests: Recent Labs  Lab 11/10/22 1126  AST 22  ALT 24  ALKPHOS 125  BILITOT 0.6  PROT 6.2*  ALBUMIN 3.0*    No results for input(s): "LIPASE", "AMYLASE" in the last 168 hours. No results for input(s): "AMMONIA" in the last 168 hours. Coagulation Profile: Recent Labs  Lab 11/10/22 1126  INR 1.1    Cardiac Enzymes: No results for input(s): "CKTOTAL", "CKMB", "CKMBINDEX", "TROPONINI" in the last 168 hours. BNP (last 3 results) No results for input(s): "PROBNP" in the last 8760 hours. HbA1C: No results for input(s): "HGBA1C" in the last 72 hours. CBG: Recent Labs  Lab 11/15/22 2346 11/16/22 0017  11/16/22 0154 11/16/22 0625 11/16/22 0651  GLUCAP 59* 78 101* 69* 209*    Lipid Profile: No results for input(s): "CHOL", "HDL", "LDLCALC", "TRIG", "CHOLHDL", "LDLDIRECT" in the last 72 hours. Thyroid Function Tests: No results for input(s): "TSH", "T4TOTAL", "FREET4", "T3FREE", "THYROIDAB" in the last 72 hours. Anemia Panel: No results for input(s): "VITAMINB12", "FOLATE", "FERRITIN", "TIBC", "IRON", "RETICCTPCT" in the last 72 hours. Sepsis Labs: Recent Labs  Lab 11/10/22 1126 11/10/22 1235  LATICACIDVEN 0.7 0.6     Recent Results (from the past 240 hour(s))  Urine Culture     Status: Abnormal   Collection Time: 11/10/22 11:24 AM   Specimen: Urine, Random  Result Value Ref Range Status   Specimen Description   Final  URINE, RANDOM Performed at [facility], [address] [place], [place] [address]    Special Requests   Final    NONE Reflexed from [id number] Performed at [facility], [address] [place], [place] [address]    Culture 80,000 COLONIES/mL ESCHERICHIA COLI (A)  Final   Report Status 11/12/2022 FINAL  Final   Organism ID, Bacteria ESCHERICHIA COLI (A)  Final      Susceptibility   Escherichia coli - MIC*    AMPICILLIN <=2 SENSITIVE Sensitive     CEFAZOLIN <=4 SENSITIVE Sensitive     CEFEPIME <=0.12 SENSITIVE Sensitive     CEFTRIAXONE <=0.25 SENSITIVE Sensitive     CIPROFLOXACIN <=0.25 SENSITIVE Sensitive     GENTAMICIN <=1 SENSITIVE Sensitive     IMIPENEM <=0.25 SENSITIVE Sensitive     NITROFURANTOIN <=16 SENSITIVE Sensitive     TRIMETH/SULFA <=20 SENSITIVE Sensitive     AMPICILLIN/SULBACTAM <=2 SENSITIVE Sensitive     PIP/TAZO <=4 SENSITIVE Sensitive     * 80,000 COLONIES/mL ESCHERICHIA COLI  Blood Culture (routine x 2)     Status: Abnormal   Collection Time: 11/10/22 11:26 AM   Specimen: BLOOD RIGHT HAND  Result Value Ref Range Status   Specimen Description   Final    BLOOD RIGHT HAND BOTTLES DRAWN AEROBIC AND ANAEROBIC Performed at [facility], [address], [place], [place] [address]    Special Requests   Final    Blood Culture adequate volume Performed at [facility], [address], [place], [place] [address]    Culture  Setup Time   Final    GRAM POSITIVE COCCI ANAEROBIC BOTTLE ONLY Gram Stain Report Called to,Read Back By and Verified With: HARRIS K @ 0934 ON [id number] BY HENDERSON L CRITICAL RESULT CALLED TO, READ BACK BY AND VERIFIED WITH: PHARMD FRANK W. [id number] FCP    Culture (A)  Final    STAPHYLOCOCCUS EPIDERMIDIS THE SIGNIFICANCE OF ISOLATING THIS ORGANISM FROM A SINGLE SET OF BLOOD CULTURES WHEN MULTIPLE SETS ARE DRAWN IS UNCERTAIN. PLEASE NOTIFY THE MICROBIOLOGY DEPARTMENT WITHIN ONE WEEK IF SPECIATION AND SENSITIVITIES ARE REQUIRED. Performed at [facility], [address], [place], [place] [address]    Report Status 11/12/2022 FINAL  Final  Blood Culture (routine x 2)     Status: None   Collection Time: 11/10/22 11:26 AM   Specimen: Right Antecubital; Blood  Result Value Ref Range Status   Specimen Description RIGHT ANTECUBITAL BOTTLES DRAWN AEROBIC ONLY  Final   Special Requests   Final    Blood Culture results may not be optimal due to an excessive volume of blood received in culture bottles   Culture   Final    NO GROWTH 5 DAYS Performed at [facility], [address] [place], [place] [address]    Report Status 11/15/2022 FINAL  Final  Blood Culture ID Panel (Reflexed)     Status: Abnormal   Collection Time: 11/10/22 11:26 AM  Result Value Ref Range Status   Enterococcus faecalis NOT DETECTED NOT DETECTED Final   Enterococcus Faecium NOT DETECTED NOT DETECTED Final   Listeria monocytogenes NOT DETECTED NOT DETECTED Final   Staphylococcus species DETECTED (A) NOT DETECTED Final    Comment: CRITICAL RESULT CALLED TO, READ BACK BY AND VERIFIED WITH: PHARMD Robbie Louis. [id number] FCP    Staphylococcus aureus (BCID) NOT DETECTED NOT DETECTED Final   Staphylococcus epidermidis DETECTED (A) NOT  DETECTED Final    Comment: Methicillin (oxacillin) resistant coagulase negative  staphylococcus. Possible blood culture contaminant (unless isolated from more than one blood culture draw or clinical case suggests pathogenicity). No antibiotic treatment is indicated for blood  culture contaminants. CRITICAL RESULT CALLED TO, READ BACK BY AND VERIFIED WITH: PHARMD Robbie Louis. [id number] FCP    Staphylococcus lugdunensis NOT DETECTED NOT DETECTED Final   Streptococcus species NOT DETECTED NOT DETECTED Final   Streptococcus agalactiae NOT DETECTED NOT DETECTED Final   Streptococcus pneumoniae NOT DETECTED NOT DETECTED Final   Streptococcus pyogenes NOT DETECTED NOT DETECTED Final   A.calcoaceticus-baumannii NOT DETECTED NOT DETECTED Final   Bacteroides fragilis NOT DETECTED NOT DETECTED Final   Enterobacterales NOT DETECTED NOT DETECTED Final   Enterobacter cloacae complex NOT DETECTED NOT DETECTED Final   Escherichia coli NOT DETECTED NOT DETECTED Final   Klebsiella aerogenes NOT DETECTED NOT DETECTED Final   Klebsiella oxytoca NOT DETECTED NOT DETECTED Final   Klebsiella pneumoniae NOT DETECTED NOT DETECTED Final   Proteus species NOT DETECTED NOT DETECTED Final   Salmonella species NOT DETECTED NOT DETECTED Final   Serratia marcescens NOT DETECTED NOT DETECTED Final   Haemophilus influenzae NOT DETECTED NOT DETECTED Final   Neisseria meningitidis NOT DETECTED NOT DETECTED Final   Pseudomonas aeruginosa NOT DETECTED NOT DETECTED Final   Stenotrophomonas maltophilia NOT DETECTED NOT DETECTED Final   Candida albicans NOT DETECTED NOT DETECTED Final   Candida auris NOT DETECTED NOT DETECTED Final   Candida glabrata NOT DETECTED NOT DETECTED Final   Candida krusei NOT DETECTED NOT DETECTED Final   Candida parapsilosis NOT DETECTED NOT DETECTED Final   Candida tropicalis NOT DETECTED NOT DETECTED Final   Cryptococcus neoformans/gattii NOT DETECTED NOT DETECTED Final   Methicillin resistance  mecA/C DETECTED (A) NOT DETECTED Final    Comment: CRITICAL RESULT CALLED TO, READ BACK BY AND VERIFIED WITH: Tia Masker. [id number] FCP Performed at [facility], [address], [place], [place] [address]   MRSA Next Gen by PCR, Nasal     Status: None   Collection Time: 11/10/22  3:00 PM   Specimen: Nasal Mucosa; Nasal Swab  Result Value Ref Range Status   MRSA by PCR Next Gen NOT DETECTED NOT DETECTED Final    Comment: (NOTE) The GeneXpert MRSA Assay (FDA approved for NASAL specimens only), is one component of a comprehensive MRSA colonization surveillance program. It is not intended to diagnose MRSA infection nor to guide or monitor treatment for MRSA infections. Test performance is not FDA approved in patients less than 50 years old. Performed at [facility], [address] [place], [place] [address]   Culture, blood (Routine X 2) w Reflex to ID Panel     Status: None (Preliminary result)   Collection Time: 11/13/22  8:59 AM   Specimen: BLOOD LEFT HAND  Result Value Ref Range Status   Specimen Description BLOOD LEFT HAND  Final   Special Requests   Final    BOTTLES DRAWN AEROBIC AND ANAEROBIC Blood Culture adequate volume   Culture   Final    NO GROWTH 3 DAYS Performed at [facility], [address], [place], [place] [address]    Report Status PENDING  Incomplete  Culture, blood (Routine X 2) w Reflex to ID Panel     Status: None (Preliminary result)   Collection Time: 11/13/22  9:15 AM   Specimen: BLOOD RIGHT HAND  Result Value Ref Range Status   Specimen Description BLOOD RIGHT HAND  Final   Special Requests   Final  BOTTLES DRAWN AEROBIC ONLY Blood Culture adequate volume   Culture   Final    NO GROWTH 3 DAYS Performed at [facility], [address], [place], [place] [address]    Report Status PENDING  Incomplete         Radiology Studies: No results found.      Scheduled Meds:  Chlorhexidine Gluconate Cloth  6 each Topical Daily    midodrine  2.5 mg Oral TID WC   nicotine  14 mg Transdermal Daily   Continuous Infusions:  sodium chloride     cefTRIAXone (ROCEPHIN)  IV Stopped (11/15/22 1127)   dextrose 5% lactated ringers       LOS: 6 days    Time spent: 35 minutes    Marlia Schewe A Holy Battenfield, MD [facility]   If 7PM-7AM, please contact night-coverage [email or web address]  11/16/2022, 9:17 AM

## 2022-11-16 NOTE — Progress Notes (Signed)
Rounding Note    Patient Name: Dustin [name] Date of Encounter: 11/16/2022  [facility] Cardiologist: New  Subjective   No complaints  Inpatient Medications    Scheduled Meds:  Chlorhexidine Gluconate Cloth  6 each Topical Daily   midodrine  5 mg Oral TID WC   nicotine  14 mg Transdermal Daily   Continuous Infusions:  sodium chloride     cefTRIAXone (ROCEPHIN)  IV Stopped (11/15/22 1127)   dextrose 5% lactated ringers     PRN Meds: acetaminophen **OR** acetaminophen, ALPRAZolam, ondansetron **OR** ondansetron (ZOFRAN) IV, mouth rinse   Vital Signs    Vitals:   11/16/22 0523 11/16/22 0600 11/16/22 0700 11/16/22 0743  BP:  (!) 151/98 (!) 136/56   Pulse:      Resp:  11 11   Temp: 97.7 F (36.5 C)   98 F (36.7 C)  TempSrc: Oral   Oral  SpO2:      Weight: 67 kg     Height:        Intake/Output Summary (Last 24 hours) at 11/16/2022 0809 Last data filed at 11/16/2022 0743 Gross per 24 hour  Intake 2729.16 ml  Output 3400 ml  Net -670.84 ml      11/16/2022    5:23 AM 11/14/2022    5:00 AM 11/13/2022    4:26 AM  Last 3 Weights  Weight (lbs) 147 lb 11.3 oz 151 lb 3.8 oz 151 lb 7.3 oz  Weight (kg) 67 kg 68.6 kg 68.7 kg      Telemetry    Sinus brady 30s to 40s, at times 60s to 70s - Personally Reviewed  ECG    N/a - Personally Reviewed  Physical Exam  Patient refused exam.   Labs    High Sensitivity Troponin:  No results for input(s): "TROPONINIHS" in the last 720 hours.   Chemistry Recent Labs  Lab 11/10/22 1126 11/10/22 1446 11/11/22 0305 11/13/22 0437 11/15/22 0506 11/16/22 0522  NA 141  --    < > 141 140 140  K 4.2  --    < > 3.8 3.2* 4.0  CL 107  --    < > 110 110 111  CO2 27  --    < > 23 23 25   GLUCOSE 69*  --    < > 64* 105* 74  BUN 15  --    < > 14 15 15   CREATININE 1.00  --    < > 1.16 1.13 1.07  CALCIUM 9.6  --    < > 8.9 8.8* 8.9  MG 1.8 1.8  --   --  1.8 2.2  PROT 6.2*  --   --   --   --   --   ALBUMIN 3.0*   --   --   --   --   --   AST 22  --   --   --   --   --   ALT 24  --   --   --   --   --   ALKPHOS 125  --   --   --   --   --   BILITOT 0.6  --   --   --   --   --   GFRNONAA >60  --    < > >60 >60 >60  ANIONGAP 7  --    < > 8 7 4*   < > = values in this interval not displayed.  Lipids No results for input(s): "CHOL", "TRIG", "HDL", "LABVLDL", "LDLCALC", "CHOLHDL" in the last 168 hours.  Hematology Recent Labs  Lab 11/11/22 0305 11/15/22 0506 11/16/22 0522  WBC 4.1 3.7* 4.0  RBC 4.33 4.36 4.46  HGB 11.4* 11.5* 11.8*  HCT 37.0* 37.0* 37.5*  MCV 85.5 84.9 84.1  MCH 26.3 26.4 26.5  MCHC 30.8 31.1 31.5  RDW 20.4* 19.9* 20.0*  PLT 52* 70* 94*   Thyroid  Recent Labs  Lab 11/10/22 1126  TSH 1.590    BNPNo results for input(s): "BNP", "PROBNP" in the last 168 hours.  DDimer No results for input(s): "DDIMER" in the last 168 hours.   Radiology    No results found.  Cardiac Studies     Patient Profile  Dustin [name] is a 67 y.o. male with a hx of colon cancer, iron deficiency anemia and schizophrenia who is being seen 11/14/2022 for the evaluation of bradycardia at the request of Dr. Gwenlyn Perking.   Assessment & Plan    1.Bradycardia - EKG sinus brady 40 first degree block long PR 350. Presented hypothermic, septic - tele sinus brady to 30s at times but not sustained, sinus pauses up to 3.7 seconds. No evidence of high grade block.  - not on any av nodal agents.  - risperdal some associated risk of bradycardia but does not appear to be common and typically at higher doses. Was started 10/25/22 as a new medication - severe schizophrenia, unable to collect any coherent history as far as possible symptoms. Risperdal held, last dose 11/15/22 0800   - follow rates for now as sepsis (day 6 of abx) and hypothermia resolve and with discontinuation of rispedal. No indication for pacemaker placement right now, certaintly with bacteremia would also not be a current candidate.  - if  persistent bradycardia and normal bp could start low dose dobutamine, if persistent bradycardia and hypotensive could start peripheral dopamine.  - follow rates, if persistent brady next 24-48 hrs consider transfer to [facility] for EP evaluation.  -rates are 60s to 70s at times this AM for first time this AM       2.Sepsis/UTI/bacteremia/hypothermia - per primary team   Appears to be on midodrine started this damission, bp's have been fine. Wean today  For questions or updates, please contact [facility] Please consult [email or web address] for contact info under        Signed, Dina Rich, MD  11/16/2022, 8:09 AM

## 2022-11-16 NOTE — TOC Progression Note (Signed)
Transition of Care [facility]) - Progression Note    Patient Details  Name: Dustin [name] MRN: [id number] Date of Birth: 04/12/1956  Transition of Care [facility]) CM/SW Contact  Karn [place], [place] Phone Number: 11/16/2022, 1:19 PM  Clinical Narrative: [facility] unable to offer bed. LCSW followed up with Eunice Blase at [facility]. Awaiting response.       Expected Discharge Plan: Skilled Nursing Facility Barriers to Discharge: Continued Medical Work up  Expected Discharge Plan and Services                                               Social Determinants of Health (SDOH) Interventions SDOH Screenings   Housing: High Risk (07/21/2022)  Alcohol Screen: Low Risk  (03/06/2022)  Depression (PHQ2-9): Low Risk  (03/06/2022)  Tobacco Use: High Risk (11/16/2022)    Readmission Risk Interventions     No data to display

## 2022-11-16 NOTE — Progress Notes (Signed)
0000 CBG: 59. Pt asymptomatic. 4oz apple juice administered. Re-check: 44. MD aware.

## 2022-11-17 DIAGNOSIS — N390 Urinary tract infection, site not specified: Secondary | ICD-10-CM | POA: Diagnosis not present

## 2022-11-17 DIAGNOSIS — A419 Sepsis, unspecified organism: Secondary | ICD-10-CM | POA: Diagnosis not present

## 2022-11-17 LAB — CULTURE, BLOOD (ROUTINE X 2): Special Requests: ADEQUATE

## 2022-11-17 LAB — GLUCOSE, CAPILLARY
Glucose-Capillary: 111 mg/dL — ABNORMAL HIGH (ref 70–99)
Glucose-Capillary: 112 mg/dL — ABNORMAL HIGH (ref 70–99)

## 2022-11-17 LAB — CREATININE, SERUM
Creatinine, Ser: 0.99 mg/dL (ref 0.61–1.24)
GFR, Estimated: >60 mL/min (ref >60)

## 2022-11-17 MED ORDER — SODIUM CHLORIDE 0.9 % IV SOLN
2.0000 g | INTRAVENOUS | Status: AC
  Administered 2022-11-17 – 2022-11-19 (×3): 2 g via INTRAVENOUS
  Filled 2022-11-17 (×3): qty 20

## 2022-11-17 NOTE — Progress Notes (Signed)
Rounding Note    Patient Name: Dustin [name] Date of Encounter: 11/17/2022  [facility] Cardiologist: New  Subjective   Agitated, does not answer my questions and asks me to "get out of here"  Inpatient Medications    Scheduled Meds:  cefadroxil  500 mg Oral BID   Chlorhexidine Gluconate Cloth  6 each Topical Daily   midodrine  2.5 mg Oral TID WC   nicotine  14 mg Transdermal Daily   Continuous Infusions:  sodium chloride     dextrose 5% lactated ringers 75 mL/hr at 11/16/22 2154   PRN Meds: acetaminophen **OR** acetaminophen, ALPRAZolam, ondansetron **OR** ondansetron (ZOFRAN) IV, mouth rinse   Vital Signs    Vitals:   11/17/22 0508 11/17/22 0600 11/17/22 0633 11/17/22 0700  BP:  120/66  132/62  Pulse:  (!) 40 (!) 39 (!) 41  Resp:  (!) 9 12 12   Temp: (S) (!) 93.5 F (34.2 C)  (!) 93.7 F (34.3 C)   TempSrc: Rectal  Rectal   SpO2:  97% 96% 93%  Weight:      Height:        Intake/Output Summary (Last 24 hours) at 11/17/2022 0810 Last data filed at 11/17/2022 0700 Gross per 24 hour  Intake 1699.6 ml  Output 1050 ml  Net 649.6 ml      11/17/2022    4:05 AM 11/16/2022    5:23 AM 11/14/2022    5:00 AM  Last 3 Weights  Weight (lbs) 145 lb 8.1 oz 147 lb 11.3 oz 151 lb 3.8 oz  Weight (kg) 66 kg 67 kg 68.6 kg      Telemetry    Sinus brady 40s-50s, some rates 80s to 90s - Personally Reviewed  ECG    N/a - Personally Reviewed  Physical Exam   Patient refuses exam  Labs    High Sensitivity Troponin:  No results for input(s): "TROPONINIHS" in the last 720 hours.   Chemistry Recent Labs  Lab 11/10/22 1126 11/10/22 1446 11/11/22 0305 11/13/22 0437 11/15/22 0506 11/16/22 0522 11/17/22 0533  NA 141  --    < > 141 140 140  --   K 4.2  --    < > 3.8 3.2* 4.0  --   CL 107  --    < > 110 110 111  --   CO2 27  --    < > 23 23 25   --   GLUCOSE 69*  --    < > 64* 105* 74  --   BUN 15  --    < > 14 15 15   --   CREATININE 1.00  --    < >  1.16 1.13 1.07 0.99  CALCIUM 9.6  --    < > 8.9 8.8* 8.9  --   MG 1.8 1.8  --   --  1.8 2.2  --   PROT 6.2*  --   --   --   --   --   --   ALBUMIN 3.0*  --   --   --   --   --   --   AST 22  --   --   --   --   --   --   ALT 24  --   --   --   --   --   --   ALKPHOS 125  --   --   --   --   --   --  BILITOT 0.6  --   --   --   --   --   --   GFRNONAA >60  --    < > >60 >60 >60 >60  ANIONGAP 7  --    < > 8 7 4*  --    < > = values in this interval not displayed.    Lipids No results for input(s): "CHOL", "TRIG", "HDL", "LABVLDL", "LDLCALC", "CHOLHDL" in the last 168 hours.  Hematology Recent Labs  Lab 11/11/22 0305 11/15/22 0506 11/16/22 0522  WBC 4.1 3.7* 4.0  RBC 4.33 4.36 4.46  HGB 11.4* 11.5* 11.8*  HCT 37.0* 37.0* 37.5*  MCV 85.5 84.9 84.1  MCH 26.3 26.4 26.5  MCHC 30.8 31.1 31.5  RDW 20.4* 19.9* 20.0*  PLT 52* 70* 94*   Thyroid  Recent Labs  Lab 11/10/22 1126  TSH 1.590    BNPNo results for input(s): "BNP", "PROBNP" in the last 168 hours.  DDimer No results for input(s): "DDIMER" in the last 168 hours.   Radiology    No results found.  Cardiac Studies    Patient Profile     Dustin [name] is a 67 y.o. male with a hx of colon cancer, iron deficiency anemia and schizophrenia who is being seen 11/14/2022 for the evaluation of bradycardia at the request of Dr. Gwenlyn Perking.   Assessment & Plan    1.Bradycardia - EKG sinus brady 40 first degree block long PR 350. Presented hypothermic, septic - tele sinus brady to 30s at times but not sustained, sinus pauses up to 3.7 seconds. No evidence of high grade block.  - not on any av nodal agents.  - risperdal some associated risk of bradycardia but does not appear to be common and typically at higher doses. Was started 10/25/22 as a new medication - severe schizophrenia, unable to collect any coherent history as far as possible symptoms. Risperdal held, last dose 11/15/22 0800   - follow rates for now as sepsis (day 7  of abx) and hypothermia resolve and with discontinuation of rispedal. No indication for pacemaker placement right now - if persistent bradycardia and normal bp could start low dose dobutamine, if persistent bradycardia and hypotensive could start peripheral dopamine.      -still hypothermic which could be the  driving cause for his bradycardia, hold on any EP referral any stable normal body temperatures. Rates mainly 40s-50s, occasionally 80s to 90s and no high degree block. BP's have been stable. Stable, have time for infection and hypothermia to be sorted out and see where HRs are at that point.      2.Sepsis/UTI/bacteremia/hypothermia - per primary team   3. Low bp's - continue to work to wean midodrine.    For questions or updates, please contact  [facility] Please consult [email or web address] for contact info under        Signed, Dina Rich, MD  11/17/2022, 8:10 AM

## 2022-11-17 NOTE — Progress Notes (Signed)
PROGRESS NOTE    Dustin [name]  ZOX:[id number] DOB: 01-13-1956 DOA: 11/10/2022 PCP: Sallyanne Kuster, NP   Brief Narrative: 67 year old with past medical history significant for schizophrenia, colon cancer, tobacco abuse, anemia presented to hospital secondary to unwitnessed mechanical fall and generalized weakness.  Workup consistent with hypothermia, bradycardia and leukopenia.  Urinalysis suggestive of UTI.  Patient admitted with sepsis criteria.  Patient has been noticed to have bradycardia asymptomatic, cardiology has been following.  Psych has been consulted for recommendation for medication management for schizophrenia due to concern with risperidone in the setting of bradycardia.  Patient noted to have some hypothermia as well as ongoing bradycardia for which she will require transfer to [facility] for further cardiology evaluation today.   Assessment & Plan:   Principal Problem:   Sepsis secondary to UTI [facility]) Active Problems:   Schizoaffective disorder, bipolar type (HCC)   Tobacco use disorder   Malignant neoplasm of ascending colon (HCC)   Pressure injury of skin   Bacteremia  Sepsis secondary to UTI with hypothermia Patient presents with leukopenia, hypothermia, source of infection urinary tract.  Initially some concern for staph epi bacteremia but subsequently discussed with ID and this was likely a contaminant. -Urine culture: E. coli -Treated  with IV ceftriaxone for 8 days, he will need a total of 10 days treatment.  Switch to oral cefdinir to IV Rocephin 2 g daily and repeat blood cultures on account of persistent hypothermia. -TSH and cortisol within normal limits  Bradycardia: Appears to be asymptomatic.  Thought to be in the setting of hypothermia, risperidone. Phosphorus, Mg. Normal.  TSH normal on admission.  Cortisol. At 9. Not low.  Per cardiology, heart rates continue to remain low, but may be related to hypothermia Plan to transfer to [facility] for  cardiology/EP evaluation  Schizoaffective disorder, bipolar: Plan to hold risperidone to see if that will help with bradycardia He received his injection for H[facility] facility Psych consulted to adjust medications  1 out of 4 staph epi blood cultures positive: Echo negative for signs of endocarditis Dr. Gwenlyn Perking discussed with Dr. Ilsa Iha and blood cultures are likely contaminate Repeat blood cultures ordered 6/14 and pending  Tobacco use disorder: Continue with nicotine patch Pressure injury: Left hip stage II: Present on admission: Continue with local care  Malignant neoplasm of ascending colon: Follow up as an outpatient with oncology.  Thrombocytopenia continue to monitor Hypotension; Started on Midodrine.  Cortisol: 9.    See wound care documentation below Pressure Injury 11/10/22 Hip Left Stage 2 -  Partial thickness loss of dermis presenting as a shallow open injury with a red, pink wound bed without slough. (Active)  11/10/22 1535  Location: Hip  Location Orientation: Left  Staging: Stage 2 -  Partial thickness loss of dermis presenting as a shallow open injury with a red, pink wound bed without slough.  Wound Description (Comments):   Present on Admission: Yes  Dressing Type Foam - Lift dressing to assess site every shift 11/17/22 0800     Pressure Injury 11/16/22 Coccyx Medial Stage 2 -  Partial thickness loss of dermis presenting as a shallow open injury with a red, pink wound bed without slough. Abbrassion from what appears to be a poped blister (Active)  11/16/22 2030  Location: Coccyx  Location Orientation: Medial  Staging: Stage 2 -  Partial thickness loss of dermis presenting as a shallow open injury with a red, pink wound bed without slough.  Wound Description (Comments): Abbrassion from what  appears to be a poped blister  Present on Admission: No  Dressing Type Foam - Lift dressing to assess site every shift 11/17/22 0800      Estimated body mass index is  22.79 kg/m as calculated from the following:   Height as of this encounter: 5\' 7"  (1.702 m).   Weight as of this encounter: 66 kg.   DVT prophylaxis: SCD, no blood thinner due to thrombocytopenia.  Code Status: Full code Family Communication: care discussed with patient. Brother over phone 6/14 Disposition Plan:  Status is: Inpatient Remains inpatient appropriate because: management of hypothermia.     Consultants:  Psych  Cardiology   Procedures:  None  Antimicrobials:  Anti-infectives (From admission, onward)    Start     Dose/Rate Route Frequency Ordered Stop   11/17/22 1000  cefadroxil (DURICEF) capsule 500 mg        500 mg Oral 2 times daily 11/16/22 1123 11/20/22 0959   11/12/22 1400  vancomycin (VANCOREADY) IVPB 1250 mg/250 mL  Status:  Discontinued        1,250 mg 166.7 mL/hr over 90 Minutes Intravenous Every 24 hours 11/11/22 1328 11/14/22 1623   11/11/22 1500  vancomycin (VANCOREADY) IVPB 1500 mg/300 mL        1,500 mg 150 mL/hr over 120 Minutes Intravenous  Once 11/11/22 1328 11/11/22 1646   11/11/22 1000  cefTRIAXone (ROCEPHIN) 2 g in sodium chloride 0.9 % 100 mL IVPB  Status:  Discontinued        2 g 200 mL/hr over 30 Minutes Intravenous Every 24 hours 11/10/22 1427 11/16/22 1123   11/10/22 1215  cefTRIAXone (ROCEPHIN) 1 g in sodium chloride 0.9 % 100 mL IVPB        1 g 200 mL/hr over 30 Minutes Intravenous  Once 11/10/22 1208 11/10/22 1310        Subjective: Patient seen and evaluated this morning with some persistent hypothermia and bradycardia noted.  He seems to deny any complaints or concerns and is cussing and refuses to answer questions.  Objective: Vitals:   11/17/22 0600 11/17/22 0633 11/17/22 0700 11/17/22 0800  BP: 120/66  132/62 109/61  Pulse: (!) 40 (!) 39 (!) 41 (!) 48  Resp: (!) 9 12 12  (!) 8  Temp:  (!) 93.7 F (34.3 C)    TempSrc:  Rectal    SpO2: 97% 96% 93% 96%  Weight:      Height:        Intake/Output Summary (Last 24  hours) at 11/17/2022 1004 Last data filed at 11/17/2022 0700 Gross per 24 hour  Intake 1579.6 ml  Output 400 ml  Net 1179.6 ml   Filed Weights   11/14/22 0500 11/16/22 0523 11/17/22 0405  Weight: 68.6 kg 67 kg 66 kg    Examination:  General exam: NAD Respiratory system: CTA Cardiovascular system: S1, S 2 RRR, bradycardic Gastrointestinal system: BS present, soft, nt Central nervous system:alert, confused.  Extremities: no edema     Data Reviewed: I have personally reviewed following labs and imaging studies  CBC: Recent Labs  Lab 11/10/22 1126 11/11/22 0305 11/15/22 0506 11/16/22 0522  WBC 3.1* 4.1 3.7* 4.0  NEUTROABS 2.4  --   --   --   HGB 12.5* 11.4* 11.5* 11.8*  HCT 40.0 37.0* 37.0* 37.5*  MCV 84.7 85.5 84.9 84.1  PLT 51* 52* 70* 94*   Basic Metabolic Panel: Recent Labs  Lab 11/10/22 1126 11/10/22 1446 11/11/22 0305 11/13/22 0437 11/15/22 0506 11/16/22 0522  11/17/22 0533  NA 141  --  143 141 140 140  --   K 4.2  --  4.2 3.8 3.2* 4.0  --   CL 107  --  111 110 110 111  --   CO2 27  --  25 23 23 25   --   GLUCOSE 69*  --  44* 64* 105* 74  --   BUN 15  --  16 14 15 15   --   CREATININE 1.00  --  1.19 1.16 1.13 1.07 0.99  CALCIUM 9.6  --  9.0 8.9 8.8* 8.9  --   MG 1.8 1.8  --   --  1.8 2.2  --   PHOS  --  2.7  --   --  2.8  --   --    GFR: Estimated Creatinine Clearance: 68.5 mL/min (by C-G formula based on SCr of 0.99 mg/dL). Liver Function Tests: Recent Labs  Lab 11/10/22 1126  AST 22  ALT 24  ALKPHOS 125  BILITOT 0.6  PROT 6.2*  ALBUMIN 3.0*   No results for input(s): "LIPASE", "AMYLASE" in the last 168 hours. No results for input(s): "AMMONIA" in the last 168 hours. Coagulation Profile: Recent Labs  Lab 11/10/22 1126  INR 1.1   Cardiac Enzymes: No results for input(s): "CKTOTAL", "CKMB", "CKMBINDEX", "TROPONINI" in the last 168 hours. BNP (last 3 results) No results for input(s): "PROBNP" in the last 8760 hours. HbA1C: No results  for input(s): "HGBA1C" in the last 72 hours. CBG: Recent Labs  Lab 11/16/22 0625 11/16/22 0651 11/16/22 1134 11/16/22 1559 11/16/22 2346  GLUCAP 69* 209* 149* 127* 93   Lipid Profile: No results for input(s): "CHOL", "HDL", "LDLCALC", "TRIG", "CHOLHDL", "LDLDIRECT" in the last 72 hours. Thyroid Function Tests: No results for input(s): "TSH", "T4TOTAL", "FREET4", "T3FREE", "THYROIDAB" in the last 72 hours. Anemia Panel: No results for input(s): "VITAMINB12", "FOLATE", "FERRITIN", "TIBC", "IRON", "RETICCTPCT" in the last 72 hours. Sepsis Labs: Recent Labs  Lab 11/10/22 1126 11/10/22 1235  LATICACIDVEN 0.7 0.6    Recent Results (from the past 240 hour(s))  Urine Culture     Status: Abnormal   Collection Time: 11/10/22 11:24 AM   Specimen: Urine, Random  Result Value Ref Range Status   Specimen Description   Final    URINE, RANDOM Performed at [facility], [address] [place], [place] [address]    Special Requests   Final    NONE Reflexed from [id number] Performed at [facility], [address] [place], [place] [address]    Culture 80,000 COLONIES/mL ESCHERICHIA COLI (A)  Final   Report Status 11/12/2022 FINAL  Final   Organism ID, Bacteria ESCHERICHIA COLI (A)  Final      Susceptibility   Escherichia coli - MIC*    AMPICILLIN <=2 SENSITIVE Sensitive     CEFAZOLIN <=4 SENSITIVE Sensitive     CEFEPIME <=0.12 SENSITIVE Sensitive     CEFTRIAXONE <=0.25 SENSITIVE Sensitive     CIPROFLOXACIN <=0.25 SENSITIVE Sensitive     GENTAMICIN <=1 SENSITIVE Sensitive     IMIPENEM <=0.25 SENSITIVE Sensitive     NITROFURANTOIN <=16 SENSITIVE Sensitive     TRIMETH/SULFA <=20 SENSITIVE Sensitive     AMPICILLIN/SULBACTAM <=2 SENSITIVE Sensitive     PIP/TAZO <=4 SENSITIVE Sensitive     * 80,000 COLONIES/mL ESCHERICHIA COLI  Blood Culture (routine x 2)     Status: Abnormal   Collection Time: 11/10/22 11:26 AM   Specimen: BLOOD RIGHT HAND  Result Value Ref  Range Status   Specimen  Description   Final    BLOOD RIGHT HAND BOTTLES DRAWN AEROBIC AND ANAEROBIC Performed at [facility], [address], [place], [place] [address]    Special Requests   Final    Blood Culture adequate volume Performed at [facility], [address], [place], [place] [address]    Culture  Setup Time   Final    GRAM POSITIVE COCCI ANAEROBIC BOTTLE ONLY Gram Stain Report Called to,Read Back By and Verified With: HARRIS K @ 0934 ON [id number] BY HENDERSON L CRITICAL RESULT CALLED TO, READ BACK BY AND VERIFIED WITH: PHARMD Robbie Louis. [id number] [id number] FCP    Culture (A)  Final    STAPHYLOCOCCUS EPIDERMIDIS THE SIGNIFICANCE OF ISOLATING THIS ORGANISM FROM A SINGLE SET OF BLOOD CULTURES WHEN MULTIPLE SETS ARE DRAWN IS UNCERTAIN. PLEASE NOTIFY THE MICROBIOLOGY DEPARTMENT WITHIN ONE WEEK IF SPECIATION AND SENSITIVITIES ARE REQUIRED. Performed at [facility], [address] [place], [place] [address]    Report Status 11/12/2022 FINAL  Final  Blood Culture (routine x 2)     Status: None   Collection Time: 11/10/22 11:26 AM   Specimen: Right Antecubital; Blood  Result Value Ref Range Status   Specimen Description RIGHT ANTECUBITAL BOTTLES DRAWN AEROBIC ONLY  Final   Special Requests   Final    Blood Culture results may not be optimal due to an excessive volume of blood received in culture bottles   Culture   Final    NO GROWTH 5 DAYS Performed at [facility], [address], [place], [place] [address]    Report Status 11/15/2022 FINAL  Final  Blood Culture ID Panel (Reflexed)     Status: Abnormal   Collection Time: 11/10/22 11:26 AM  Result Value Ref Range Status   Enterococcus faecalis NOT DETECTED NOT DETECTED Final   Enterococcus Faecium NOT DETECTED NOT DETECTED Final   Listeria monocytogenes NOT DETECTED NOT DETECTED Final   Staphylococcus species DETECTED (A) NOT DETECTED Final    Comment: CRITICAL RESULT CALLED TO, READ BACK BY AND VERIFIED WITH: PHARMD Robbie Louis. [id number] [id number] FCP     Staphylococcus aureus (BCID) NOT DETECTED NOT DETECTED Final   Staphylococcus epidermidis DETECTED (A) NOT DETECTED Final    Comment: Methicillin (oxacillin) resistant coagulase negative staphylococcus. Possible blood culture contaminant (unless isolated from more than one blood culture draw or clinical case suggests pathogenicity). No antibiotic treatment is indicated for blood  culture contaminants. CRITICAL RESULT CALLED TO, READ BACK BY AND VERIFIED WITH: PHARMD Robbie Louis. [id number] FCP    Staphylococcus lugdunensis NOT DETECTED NOT DETECTED Final   Streptococcus species NOT DETECTED NOT DETECTED Final   Streptococcus agalactiae NOT DETECTED NOT DETECTED Final   Streptococcus pneumoniae NOT DETECTED NOT DETECTED Final   Streptococcus pyogenes NOT DETECTED NOT DETECTED Final   A.calcoaceticus-baumannii NOT DETECTED NOT DETECTED Final   Bacteroides fragilis NOT DETECTED NOT DETECTED Final   Enterobacterales NOT DETECTED NOT DETECTED Final   Enterobacter cloacae complex NOT DETECTED NOT DETECTED Final   Escherichia coli NOT DETECTED NOT DETECTED Final   Klebsiella aerogenes NOT DETECTED NOT DETECTED Final   Klebsiella oxytoca NOT DETECTED NOT DETECTED Final   Klebsiella pneumoniae NOT DETECTED NOT DETECTED Final   Proteus species NOT DETECTED NOT DETECTED Final   Salmonella species NOT DETECTED NOT DETECTED Final   Serratia marcescens NOT DETECTED NOT DETECTED Final   Haemophilus influenzae NOT DETECTED NOT DETECTED Final   Neisseria meningitidis NOT DETECTED NOT  DETECTED Final   Pseudomonas aeruginosa NOT DETECTED NOT DETECTED Final   Stenotrophomonas maltophilia NOT DETECTED NOT DETECTED Final   Candida albicans NOT DETECTED NOT DETECTED Final   Candida auris NOT DETECTED NOT DETECTED Final   Candida glabrata NOT DETECTED NOT DETECTED Final   Candida krusei NOT DETECTED NOT DETECTED Final   Candida parapsilosis NOT DETECTED NOT DETECTED Final   Candida tropicalis NOT DETECTED NOT  DETECTED Final   Cryptococcus neoformans/gattii NOT DETECTED NOT DETECTED Final   Methicillin resistance mecA/C DETECTED (A) NOT DETECTED Final    Comment: CRITICAL RESULT CALLED TO, READ BACK BY AND VERIFIED WITH: Tia Masker. 1310 [id number] FCP Performed at [facility], [address], [place], [place] [address]   MRSA Next Gen by PCR, Nasal     Status: None   Collection Time: 11/10/22  3:00 PM   Specimen: Nasal Mucosa; Nasal Swab  Result Value Ref Range Status   MRSA by PCR Next Gen NOT DETECTED NOT DETECTED Final    Comment: (NOTE) The GeneXpert MRSA Assay (FDA approved for NASAL specimens only), is one component of a comprehensive MRSA colonization surveillance program. It is not intended to diagnose MRSA infection nor to guide or monitor treatment for MRSA infections. Test performance is not FDA approved in patients less than 67 years old. Performed at [facility], [address] [place], [place] [address]   Culture, blood (Routine X 2) w Reflex to ID Panel     Status: None (Preliminary result)   Collection Time: 11/13/22  8:59 AM   Specimen: BLOOD LEFT HAND  Result Value Ref Range Status   Specimen Description BLOOD LEFT HAND  Final   Special Requests   Final    BOTTLES DRAWN AEROBIC AND ANAEROBIC Blood Culture adequate volume   Culture   Final    NO GROWTH 4 DAYS Performed at [facility], [address], [place], [place] [address]    Report Status PENDING  Incomplete  Culture, blood (Routine X 2) w Reflex to ID Panel     Status: None (Preliminary result)   Collection Time: 11/13/22  9:15 AM   Specimen: BLOOD RIGHT HAND  Result Value Ref Range Status   Specimen Description BLOOD RIGHT HAND  Final   Special Requests   Final    BOTTLES DRAWN AEROBIC ONLY Blood Culture adequate volume   Culture   Final    NO GROWTH 4 DAYS Performed at [facility], [address] [place], [place] [address]    Report Status PENDING  Incomplete         Radiology  Studies: No results found.      Scheduled Meds:  cefadroxil  500 mg Oral BID   Chlorhexidine Gluconate Cloth  6 each Topical Daily   midodrine  2.5 mg Oral TID WC   nicotine  14 mg Transdermal Daily   Continuous Infusions:  sodium chloride     dextrose 5% lactated ringers 75 mL/hr at 11/16/22 2154     LOS: 7 days    Time spent: 35 minutes    Braelin Costlow Hoover Brunette, DO [facility]   If 7PM-7AM, please contact night-coverage [email or web address]  11/17/2022, 10:04 AM

## 2022-11-17 NOTE — Progress Notes (Signed)
Patient refused to check morning  cbg 2 times.  Next check is at 1200

## 2022-11-18 DIAGNOSIS — R001 Bradycardia, unspecified: Secondary | ICD-10-CM | POA: Diagnosis not present

## 2022-11-18 DIAGNOSIS — N390 Urinary tract infection, site not specified: Secondary | ICD-10-CM | POA: Diagnosis not present

## 2022-11-18 DIAGNOSIS — A419 Sepsis, unspecified organism: Secondary | ICD-10-CM | POA: Diagnosis not present

## 2022-11-18 LAB — CBC
HCT: 38.7 % — ABNORMAL LOW (ref 39.0–52.0)
Hemoglobin: 12.4 g/dL — ABNORMAL LOW (ref 13.0–17.0)
MCH: 26.7 pg (ref 26.0–34.0)
MCHC: 32 g/dL (ref 30.0–36.0)
MCV: 83.4 fL (ref 80.0–100.0)
Platelets: 110 10*3/uL — ABNORMAL LOW (ref 150–400)
RBC: 4.64 MIL/uL (ref 4.22–5.81)
RDW: 19.9 % — ABNORMAL HIGH (ref 11.5–15.5)
WBC: 4.3 10*3/uL (ref 4.0–10.5)
nRBC: 0 % (ref 0.0–0.2)

## 2022-11-18 LAB — CULTURE, BLOOD (ROUTINE X 2)
Culture: NO GROWTH
Special Requests: ADEQUATE

## 2022-11-18 LAB — BASIC METABOLIC PANEL
Anion gap: 10 (ref 5–15)
BUN: 12 mg/dL (ref 8–23)
CO2: 25 mmol/L (ref 22–32)
Calcium: 9.1 mg/dL (ref 8.9–10.3)
Chloride: 102 mmol/L (ref 98–111)
Creatinine, Ser: 0.94 mg/dL (ref 0.61–1.24)
GFR, Estimated: >60 mL/min (ref >60)
Glucose, Bld: 95 mg/dL (ref 70–99)
Potassium: 4.1 mmol/L (ref 3.5–5.1)
Sodium: 137 mmol/L (ref 135–145)

## 2022-11-18 LAB — GLUCOSE, CAPILLARY
Glucose-Capillary: 107 mg/dL — ABNORMAL HIGH (ref 70–99)
Glucose-Capillary: 118 mg/dL — ABNORMAL HIGH (ref 70–99)

## 2022-11-18 LAB — MAGNESIUM: Magnesium: 1.9 mg/dL (ref 1.7–2.4)

## 2022-11-18 NOTE — Progress Notes (Signed)
CSW attempted to contact pt legal guardian to discuss STR recommendation. No answer and VM was left.   TOC will continue to follow.

## 2022-11-18 NOTE — Progress Notes (Signed)
PROGRESS NOTE  Pearl Francis Knee  ZOX:[id number] DOB: 07-Mar-1956 DOA: 11/10/2022 PCP: Sallyanne Kuster, NP   Brief Narrative: Patient is a 67 year old male with history of schizophrenia, colon cancer, diabetes, anemia who presented to [facility] initially after mechanical fall, generalized weakness.  On presentation, found to be hypothermic, bradycardic.  He was history of UTI.  Patient was admitted for the suspicion of sepsis.  Hospital course remarkable for persistent bradycardia/hypothermia, cardiology was consulted.  Psychiatry was also following for the management of schizophrenia.  Patient was transferred to [facility] for further cardiology workup regarding bradycardia.  But apparently his bradycardia has improved and cardiology signed off saying no indication of pacemaker.  PT/OT recommending SNF on discharge.  Medically stable for discharge whenever possible  Assessment & Plan:  Principal Problem:   Sepsis secondary to UTI [facility]) Active Problems:   Schizoaffective disorder, bipolar type (HCC)   Tobacco use disorder   Malignant neoplasm of ascending colon (HCC)   Pressure injury of skin   Bacteremia  Sepsis secondary to UTI/bacteremia: Presented with leukopenia, hypothermia, weakness.  Urine culture showed E. coli.  Culture showed Staph epidermidis on 1 set, likely contaminant.  Repeat cultures have been negative.    Echo negative for endocarditis.  ID was following. He will complete 10 days of ceftriaxone tomorrow.  Bradycardia: Inpatient symptomatic.  Will also be related to hypothermia, risperidone.  TSH normal.  Patient transferred to [facility] for EP evaluation but apparently his bradycardia has improved and currently signed off saying no need of pacemaker.  Avoid nodal blocking agents  Schizoaffective disorder/bipolar disorder: Psychiatry was following.  Risperidone held.  Will request for psychiatry follow-up here  to optimize medications for his schizoaffective  disorder.  Tobacco use: Continue nicotine patch.  Counseled cessation  Colon cancer: Will recommend to follow-up with oncology as soon as possible after discharge  Hypotension: Started on midodrine.  Thrombocytopenia: Stable, continue to monitor  Debility/deconditioning: PT/OT consulted, recommended SNF on discharge.  Social worker consulted and following  Pressure Injury 11/10/22 Hip Left Stage 2 -  Partial thickness loss of dermis presenting as a shallow open injury with a red, pink wound bed without slough. (Active)  11/10/22 1535  Location: Hip  Location Orientation: Left  Staging: Stage 2 -  Partial thickness loss of dermis presenting as a shallow open injury with a red, pink wound bed without slough.  Wound Description (Comments):   Present on Admission: Yes     Pressure Injury 11/16/22 Coccyx Medial Stage 2 -  Partial thickness loss of dermis presenting as a shallow open injury with a red, pink wound bed without slough. Abbrassion from what appears to be a poped blister (Active)  11/16/22 2030  Location: Coccyx  Location Orientation: Medial  Staging: Stage 2 -  Partial thickness loss of dermis presenting as a shallow open injury with a red, pink wound bed without slough.  Wound Description (Comments): Abbrassion from what appears to be a poped blister  Present on Admission: No         Pressure Injury 11/10/22 Hip Left Stage 2 -  Partial thickness loss of dermis presenting as a shallow open injury with a red, pink wound bed without slough. (Active)  11/10/22 1535  Location: Hip  Location Orientation: Left  Staging: Stage 2 -  Partial thickness loss of dermis presenting as a shallow open injury with a red, pink wound bed without slough.  Wound Description (Comments):   Present on Admission: Yes  Dressing Type  Foam - Lift dressing to assess site every shift 11/17/22 1954     Pressure Injury 11/16/22 Coccyx Medial Stage 2 -  Partial thickness loss of dermis presenting as a  shallow open injury with a red, pink wound bed without slough. Abbrassion from what appears to be a poped blister (Active)  11/16/22 2030  Location: Coccyx  Location Orientation: Medial  Staging: Stage 2 -  Partial thickness loss of dermis presenting as a shallow open injury with a red, pink wound bed without slough.  Wound Description (Comments): Abbrassion from what appears to be a poped blister  Present on Admission: No  Dressing Type Foam - Lift dressing to assess site every shift 11/17/22 1954    DVT prophylaxis:SCDs Start: 11/10/22 1433     Code Status: Full Code  Family Communication: None at bedside  Patient status:Inpatient  Patient is from :Home  Anticipated discharge to:SNF  Estimated DC date:2-3 days   Consultants: Cardiology,psychiatry  Procedures:None  Antimicrobials:  Anti-infectives (From admission, onward)    Start     Dose/Rate Route Frequency Ordered Stop   11/17/22 1100  cefTRIAXone (ROCEPHIN) 2 g in sodium chloride 0.9 % 100 mL IVPB        2 g 200 mL/hr over 30 Minutes Intravenous Every 24 hours 11/17/22 1004     11/17/22 1000  cefadroxil (DURICEF) capsule 500 mg  Status:  Discontinued        500 mg Oral 2 times daily 11/16/22 1123 11/17/22 1004   11/12/22 1400  vancomycin (VANCOREADY) IVPB 1250 mg/250 mL  Status:  Discontinued        1,250 mg 166.7 mL/hr over 90 Minutes Intravenous Every 24 hours 11/11/22 1328 11/14/22 1623   11/11/22 1500  vancomycin (VANCOREADY) IVPB 1500 mg/300 mL        1,500 mg 150 mL/hr over 120 Minutes Intravenous  Once 11/11/22 1328 11/11/22 1646   11/11/22 1000  cefTRIAXone (ROCEPHIN) 2 g in sodium chloride 0.9 % 100 mL IVPB  Status:  Discontinued        2 g 200 mL/hr over 30 Minutes Intravenous Every 24 hours 11/10/22 1427 11/16/22 1123   11/10/22 1215  cefTRIAXone (ROCEPHIN) 1 g in sodium chloride 0.9 % 100 mL IVPB        1 g 200 mL/hr over 30 Minutes Intravenous  Once 11/10/22 1208 11/10/22 1310        Subjective: Patient seen and examined at bedside today.  Lying in bed.  Hemodynamically stable.  Heart rate within the range of 50s during my evaluation as per the EKG monitor.  He is asymptomatic.  Denies any shortness of breath, chest pain or lightheadedness or dizziness.  Patient is a very poor historian.  Not sure whether he is fully oriented.  He was not in any distress  Objective: Vitals:   11/18/22 0115 11/18/22 0310 11/18/22 0415 11/18/22 0725  BP: (!) 143/80  133/83 111/79  Pulse: (!) 38  (!) 42 (!) 47  Resp: 16  14 16   Temp:  98.2 F (36.8 C) 98.3 F (36.8 C) 98.3 F (36.8 C)  TempSrc:   Oral Oral  SpO2: 100%  94% 99%  Weight:      Height:        Intake/Output Summary (Last 24 hours) at 11/18/2022 0815 Last data filed at 11/18/2022 0630 Gross per 24 hour  Intake 1934.9 ml  Output 1100 ml  Net 834.9 ml   Filed Weights   11/14/22 0500 11/16/22 0523 11/17/22  0405  Weight: 68.6 kg 67 kg 66 kg    Examination:  General exam: Overall comfortable, not in distress HEENT: PERRL Respiratory system:  no wheezes or crackles  Cardiovascular system: S1 & S2 heard, RRR. Mild bradycardia Gastrointestinal system: Abdomen is nondistended, soft and nontender. Central nervous system: Alert and awake,oriented to place Extremities: No edema, no clubbing ,no cyanosis Skin: No rashes, no ulcers,no icterus     Data Reviewed: I have personally reviewed following labs and imaging studies  CBC: Recent Labs  Lab 11/15/22 0506 11/16/22 0522 11/18/22 0106  WBC 3.7* 4.0 4.3  HGB 11.5* 11.8* 12.4*  HCT 37.0* 37.5* 38.7*  MCV 84.9 84.1 83.4  PLT 70* 94* 110*   Basic Metabolic Panel: Recent Labs  Lab 11/13/22 0437 11/15/22 0506 11/16/22 0522 11/17/22 0533 11/18/22 0106  NA 141 140 140  --  137  K 3.8 3.2* 4.0  --  4.1  CL 110 110 111  --  102  CO2 23 23 25   --  25  GLUCOSE 64* 105* 74  --  95  BUN 14 15 15   --  12  CREATININE 1.16 1.13 1.07 0.99 0.94  CALCIUM 8.9  8.8* 8.9  --  9.1  MG  --  1.8 2.2  --  1.9  PHOS  --  2.8  --   --   --      Recent Results (from the past 240 hour(s))  Urine Culture     Status: Abnormal   Collection Time: 11/10/22 11:24 AM   Specimen: Urine, Random  Result Value Ref Range Status   Specimen Description   Final    URINE, RANDOM Performed at [facility], [address] [place], [place] [address]    Special Requests   Final    NONE Reflexed from [id number] Performed at [facility], [address], [place], [place] [address]    Culture 80,000 COLONIES/mL ESCHERICHIA COLI (A)  Final   Report Status 11/12/2022 FINAL  Final   Organism ID, Bacteria ESCHERICHIA COLI (A)  Final      Susceptibility   Escherichia coli - MIC*    AMPICILLIN <=2 SENSITIVE Sensitive     CEFAZOLIN <=4 SENSITIVE Sensitive     CEFEPIME <=0.12 SENSITIVE Sensitive     CEFTRIAXONE <=0.25 SENSITIVE Sensitive     CIPROFLOXACIN <=0.25 SENSITIVE Sensitive     GENTAMICIN <=1 SENSITIVE Sensitive     IMIPENEM <=0.25 SENSITIVE Sensitive     NITROFURANTOIN <=16 SENSITIVE Sensitive     TRIMETH/SULFA <=20 SENSITIVE Sensitive     AMPICILLIN/SULBACTAM <=2 SENSITIVE Sensitive     PIP/TAZO <=4 SENSITIVE Sensitive     * 80,000 COLONIES/mL ESCHERICHIA COLI  Blood Culture (routine x 2)     Status: Abnormal   Collection Time: 11/10/22 11:26 AM   Specimen: BLOOD RIGHT HAND  Result Value Ref Range Status   Specimen Description   Final    BLOOD RIGHT HAND BOTTLES DRAWN AEROBIC AND ANAEROBIC Performed at [facility], [address], [place], [place] [address]    Special Requests   Final    Blood Culture adequate volume Performed at [facility], [address], [place], [place] [address]    Culture  Setup Time   Final    GRAM POSITIVE COCCI ANAEROBIC BOTTLE ONLY Gram Stain Report Called to,Read Back By and Verified With: HARRIS K @ 0934 ON [id number] BY HENDERSON L CRITICAL RESULT CALLED TO, READ BACK BY AND VERIFIED WITH: PHARMD Robbie Louis. [id number]  FCP     Culture (A)  Final    STAPHYLOCOCCUS EPIDERMIDIS THE SIGNIFICANCE OF ISOLATING THIS ORGANISM FROM A SINGLE SET OF BLOOD CULTURES WHEN MULTIPLE SETS ARE DRAWN IS UNCERTAIN. PLEASE NOTIFY THE MICROBIOLOGY DEPARTMENT WITHIN ONE WEEK IF SPECIATION AND SENSITIVITIES ARE REQUIRED. Performed at [facility], [address], [place], [place] [address]    Report Status 11/12/2022 FINAL  Final  Blood Culture (routine x 2)     Status: None   Collection Time: 11/10/22 11:26 AM   Specimen: Right Antecubital; Blood  Result Value Ref Range Status   Specimen Description RIGHT ANTECUBITAL BOTTLES DRAWN AEROBIC ONLY  Final   Special Requests   Final    Blood Culture results may not be optimal due to an excessive volume of blood received in culture bottles   Culture   Final    NO GROWTH 5 DAYS Performed at [facility], [address], [place], [place] [address]    Report Status 11/15/2022 FINAL  Final  Blood Culture ID Panel (Reflexed)     Status: Abnormal   Collection Time: 11/10/22 11:26 AM  Result Value Ref Range Status   Enterococcus faecalis NOT DETECTED NOT DETECTED Final   Enterococcus Faecium NOT DETECTED NOT DETECTED Final   Listeria monocytogenes NOT DETECTED NOT DETECTED Final   Staphylococcus species DETECTED (A) NOT DETECTED Final    Comment: CRITICAL RESULT CALLED TO, READ BACK BY AND VERIFIED WITH: PHARMD Robbie Louis. [phone] [id number] FCP    Staphylococcus aureus (BCID) NOT DETECTED NOT DETECTED Final   Staphylococcus epidermidis DETECTED (A) NOT DETECTED Final    Comment: Methicillin (oxacillin) resistant coagulase negative staphylococcus. Possible blood culture contaminant (unless isolated from more than one blood culture draw or clinical case suggests pathogenicity). No antibiotic treatment is indicated for blood  culture contaminants. CRITICAL RESULT CALLED TO, READ BACK BY AND VERIFIED WITH: PHARMD Robbie Louis. [phone] [id number] FCP    Staphylococcus lugdunensis NOT DETECTED NOT DETECTED Final    Streptococcus species NOT DETECTED NOT DETECTED Final   Streptococcus agalactiae NOT DETECTED NOT DETECTED Final   Streptococcus pneumoniae NOT DETECTED NOT DETECTED Final   Streptococcus pyogenes NOT DETECTED NOT DETECTED Final   A.calcoaceticus-baumannii NOT DETECTED NOT DETECTED Final   Bacteroides fragilis NOT DETECTED NOT DETECTED Final   Enterobacterales NOT DETECTED NOT DETECTED Final   Enterobacter cloacae complex NOT DETECTED NOT DETECTED Final   Escherichia coli NOT DETECTED NOT DETECTED Final   Klebsiella aerogenes NOT DETECTED NOT DETECTED Final   Klebsiella oxytoca NOT DETECTED NOT DETECTED Final   Klebsiella pneumoniae NOT DETECTED NOT DETECTED Final   Proteus species NOT DETECTED NOT DETECTED Final   Salmonella species NOT DETECTED NOT DETECTED Final   Serratia marcescens NOT DETECTED NOT DETECTED Final   Haemophilus influenzae NOT DETECTED NOT DETECTED Final   Neisseria meningitidis NOT DETECTED NOT DETECTED Final   Pseudomonas aeruginosa NOT DETECTED NOT DETECTED Final   Stenotrophomonas maltophilia NOT DETECTED NOT DETECTED Final   Candida albicans NOT DETECTED NOT DETECTED Final   Candida auris NOT DETECTED NOT DETECTED Final   Candida glabrata NOT DETECTED NOT DETECTED Final   Candida krusei NOT DETECTED NOT DETECTED Final   Candida parapsilosis NOT DETECTED NOT DETECTED Final   Candida tropicalis NOT DETECTED NOT DETECTED Final   Cryptococcus neoformans/gattii NOT DETECTED NOT DETECTED Final   Methicillin resistance mecA/C DETECTED (A) NOT DETECTED Final    Comment: CRITICAL RESULT CALLED TO, READ BACK BY AND VERIFIED WITH: PHARMD FRANK W.  [phone] [id number] FCP Performed at [facility], [address], [place], [place] [address]   MRSA Next Gen by PCR, Nasal     Status: None   Collection Time: 11/10/22  3:00 PM   Specimen: Nasal Mucosa; Nasal Swab  Result Value Ref Range Status   MRSA by PCR Next Gen NOT DETECTED NOT DETECTED Final    Comment: (NOTE) The  GeneXpert MRSA Assay (FDA approved for NASAL specimens only), is one component of a comprehensive MRSA colonization surveillance program. It is not intended to diagnose MRSA infection nor to guide or monitor treatment for MRSA infections. Test performance is not FDA approved in patients less than 11 years old. Performed at [facility], [address] [place], [place] [address]   Culture, blood (Routine X 2) w Reflex to ID Panel     Status: None   Collection Time: 11/13/22  8:59 AM   Specimen: BLOOD LEFT HAND  Result Value Ref Range Status   Specimen Description BLOOD LEFT HAND  Final   Special Requests   Final    BOTTLES DRAWN AEROBIC AND ANAEROBIC Blood Culture adequate volume   Culture   Final    NO GROWTH 5 DAYS Performed at [facility], [address], [place], [place] [address]    Report Status 11/18/2022 FINAL  Final  Culture, blood (Routine X 2) w Reflex to ID Panel     Status: None   Collection Time: 11/13/22  9:15 AM   Specimen: BLOOD RIGHT HAND  Result Value Ref Range Status   Specimen Description BLOOD RIGHT HAND  Final   Special Requests   Final    BOTTLES DRAWN AEROBIC ONLY Blood Culture adequate volume   Culture   Final    NO GROWTH 5 DAYS Performed at [facility], [address], [place], [place] [address]    Report Status 11/18/2022 FINAL  Final     Radiology Studies: No results found.  Scheduled Meds:  Chlorhexidine Gluconate Cloth  6 each Topical Daily   midodrine  2.5 mg Oral TID WC   nicotine  14 mg Transdermal Daily   Continuous Infusions:  sodium chloride     cefTRIAXone (ROCEPHIN)  IV Stopped (11/17/22 1307)   dextrose 5% lactated ringers 75 mL/hr at 11/18/22 0000     LOS: 8 days   Burnadette Pop, MD [facility] P6/15/2024, 8:15 AM

## 2022-11-18 NOTE — Progress Notes (Signed)
Rounding Note    Patient Name: Dustin [name] Date of Encounter: 11/18/2022  [facility] Cardiologist: Dominga Ferry   Subjective   67 yo with shcizophrenia  Asymptomatic bradycardia Had episode of syncope / weakness in his group home Found to have urosepsis   HR is 55-60 ( sinus )   Inpatient Medications    Scheduled Meds:  Chlorhexidine Gluconate Cloth  6 each Topical Daily   midodrine  2.5 mg Oral TID WC   nicotine  14 mg Transdermal Daily   Continuous Infusions:  sodium chloride     cefTRIAXone (ROCEPHIN)  IV 2 g (11/18/22 1147)   dextrose 5% lactated ringers 75 mL/hr at 11/18/22 0000   PRN Meds: acetaminophen **OR** acetaminophen, ALPRAZolam, ondansetron **OR** ondansetron (ZOFRAN) IV, mouth rinse   Vital Signs    Vitals:   11/18/22 0310 11/18/22 0415 11/18/22 0725 11/18/22 1129  BP:  133/83 111/79 126/80  Pulse:  (!) 42 (!) 47 (!) 48  Resp:  14 16 17   Temp: 98.2 F (36.8 C) 98.3 F (36.8 C) 98.3 F (36.8 C) 98.1 F (36.7 C)  TempSrc:  Oral Oral Oral  SpO2:  94% 99% 97%  Weight:      Height:        Intake/Output Summary (Last 24 hours) at 11/18/2022 1338 Last data filed at 11/18/2022 1133 Gross per 24 hour  Intake 1574.9 ml  Output 1100 ml  Net 474.9 ml      11/17/2022    4:05 AM 11/16/2022    5:23 AM 11/14/2022    5:00 AM  Last 3 Weights  Weight (lbs) 145 lb 8.1 oz 147 lb 11.3 oz 151 lb 3.8 oz  Weight (kg) 66 kg 67 kg 68.6 kg      Telemetry    Sinus brady / NSR   55-61  - Personally Reviewed  ECG     - Personally Reviewed  Physical Exam   GEN: No acute distress.   Neck: No JVD Cardiac: RRR, no murmurs, rubs, or gallops.  Respiratory: Clear to auscultation bilaterally. GI: Soft, nontender, non-distended  MS: No edema; No deformity. Neuro:  Nonfocal  Psych: Normal affect   Labs    High Sensitivity Troponin:  No results for input(s): "TROPONINIHS" in the last 720 hours.   Chemistry Recent Labs  Lab 11/15/22 0506  11/16/22 0522 11/17/22 0533 11/18/22 0106  NA 140 140  --  137  K 3.2* 4.0  --  4.1  CL 110 111  --  102  CO2 23 25  --  25  GLUCOSE 105* 74  --  95  BUN 15 15  --  12  CREATININE 1.13 1.07 0.99 0.94  CALCIUM 8.8* 8.9  --  9.1  MG 1.8 2.2  --  1.9  GFRNONAA >60 >60 >60 >60  ANIONGAP 7 4*  --  10    Lipids No results for input(s): "CHOL", "TRIG", "HDL", "LABVLDL", "LDLCALC", "CHOLHDL" in the last 168 hours.  Hematology Recent Labs  Lab 11/15/22 0506 11/16/22 0522 11/18/22 0106  WBC 3.7* 4.0 4.3  RBC 4.36 4.46 4.64  HGB 11.5* 11.8* 12.4*  HCT 37.0* 37.5* 38.7*  MCV 84.9 84.1 83.4  MCH 26.4 26.5 26.7  MCHC 31.1 31.5 32.0  RDW 19.9* 20.0* 19.9*  PLT 70* 94* 110*   Thyroid No results for input(s): "TSH", "FREET4" in the last 168 hours.  BNPNo results for input(s): "BNP", "PROBNP" in the last 168 hours.  DDimer No results  for input(s): "DDIMER" in the last 168 hours.   Radiology    No results found.  Cardiac Studies     Patient Profile     67 y.o. male   Assessment & Plan     Asymptomatic sinus bradycardia:   HR has increased  Hx is very difficult but it does not appear that he is having cardiac syncope   No indication for pacer    [facility] will sign off.   Medication Recommendations:  follow up with his medical doctor  Other recommendations (labs, testing, etc):   Follow up as an outpatient:    For questions or updates, please contact [facility] Please consult [email or web address] for contact info under        Signed, Kristeen Miss, MD  11/18/2022, 1:38 PM

## 2022-11-18 NOTE — Plan of Care (Signed)
  Problem: Education: Goal: Knowledge of General Education information will improve Description: Including pain rating scale, medication(s)/side effects and non-pharmacologic comfort measures 11/18/2022 2327 by Orson Ape, RN Outcome: Progressing 11/18/2022 2327 by Orson Ape, RN Outcome: Progressing   Problem: Health Behavior/Discharge Planning: Goal: Ability to manage health-related needs will improve 11/18/2022 2327 by Orson Ape, RN Outcome: Progressing 11/18/2022 2327 by Orson Ape, RN Outcome: Progressing   Problem: Clinical Measurements: Goal: Ability to maintain clinical measurements within normal limits will improve 11/18/2022 2327 by Orson Ape, RN Outcome: Progressing 11/18/2022 2327 by Orson Ape, RN Outcome: Progressing Goal: Will remain free from infection 11/18/2022 2327 by Orson Ape, RN Outcome: Progressing 11/18/2022 2327 by Orson Ape, RN Outcome: Progressing Goal: Diagnostic test results will improve 11/18/2022 2327 by Orson Ape, RN Outcome: Progressing 11/18/2022 2327 by Orson Ape, RN Outcome: Progressing Goal: Respiratory complications will improve 11/18/2022 2327 by Orson Ape, RN Outcome: Progressing 11/18/2022 2327 by Orson Ape, RN Outcome: Progressing Goal: Cardiovascular complication will be avoided 11/18/2022 2327 by Orson Ape, RN Outcome: Progressing 11/18/2022 2327 by Orson Ape, RN Outcome: Progressing   Problem: Activity: Goal: Risk for activity intolerance will decrease 11/18/2022 2327 by Orson Ape, RN Outcome: Progressing 11/18/2022 2327 by Orson Ape, RN Outcome: Progressing   Problem: Nutrition: Goal: Adequate nutrition will be maintained 11/18/2022 2327 by Orson Ape, RN Outcome: Progressing 11/18/2022 2327 by Orson Ape, RN Outcome: Progressing   Problem: Coping: Goal: Level of anxiety will decrease 11/18/2022  2327 by Orson Ape, RN Outcome: Progressing 11/18/2022 2327 by Orson Ape, RN Outcome: Progressing   Problem: Elimination: Goal: Will not experience complications related to bowel motility 11/18/2022 2327 by Orson Ape, RN Outcome: Progressing 11/18/2022 2327 by Orson Ape, RN Outcome: Progressing Goal: Will not experience complications related to urinary retention 11/18/2022 2327 by Orson Ape, RN Outcome: Progressing 11/18/2022 2327 by Orson Ape, RN Outcome: Progressing   Problem: Pain Managment: Goal: General experience of comfort will improve 11/18/2022 2327 by Orson Ape, RN Outcome: Progressing 11/18/2022 2327 by Orson Ape, RN Outcome: Progressing   Problem: Safety: Goal: Ability to remain free from injury will improve 11/18/2022 2327 by Orson Ape, RN Outcome: Progressing 11/18/2022 2327 by Orson Ape, RN Outcome: Progressing   Problem: Skin Integrity: Goal: Risk for impaired skin integrity will decrease 11/18/2022 2327 by Orson Ape, RN Outcome: Progressing 11/18/2022 2327 by Orson Ape, RN Outcome: Progressing

## 2022-11-19 DIAGNOSIS — A419 Sepsis, unspecified organism: Secondary | ICD-10-CM | POA: Diagnosis not present

## 2022-11-19 DIAGNOSIS — N390 Urinary tract infection, site not specified: Secondary | ICD-10-CM | POA: Diagnosis not present

## 2022-11-19 LAB — GLUCOSE, CAPILLARY
Glucose-Capillary: 109 mg/dL — ABNORMAL HIGH (ref 70–99)
Glucose-Capillary: 118 mg/dL — ABNORMAL HIGH (ref 70–99)
Glucose-Capillary: 79 mg/dL (ref 70–99)

## 2022-11-19 NOTE — Consult Note (Signed)
Face to Face psych Consultation   Reason for Consult:  Management of medication. Referring Physician: Hartley Barefoot MD Location of Patient:  [phone] Location of Provider: Other: [facility]  Patient Identification: Dustin [name] MRN:  [id number] Principal Diagnosis: Sepsis secondary to UTI [facility]) Diagnosis:  Principal Problem:   Sepsis secondary to UTI [facility]) Active Problems:   Tobacco use disorder   Schizoaffective disorder, bipolar type (HCC)   Malignant neoplasm of ascending colon (HCC)   Pressure injury of skin   Bacteremia   Total Time spent with patient: 15 minutes  HPI:  Per admission assessment note: " Dustin [name] is a 67 y.o. male.  He is brought in by EMS for possible code sepsis.  He reportedly had a fall overnight at his facility and was put back to bed.  This morning is complaining of right shoulder and right hip pain.  EMS noted blood pressure low, bradycardic, low temperature." Subjective:   Dustin [name] is a 67 y.o. male who was seen and reevaluated.  He was initially seen by telemetry assessment on 11/15/18/2024 for a medication assessment.  One of the considerations was the fact that the patient may be bradycardic secondary to medications.  Other causes of bradycardia were advised and it was felt that the patient was on [place] and Haldol Decanoate both prescribed by Dr. Marquis Lunch with several refills.  Risperdal was the new medication prescribed.  It was felt that Risperdal was unlikely to be a major contributor to the tachycardia.  It was also felt that the patient may not require 3 separate antipsychotics. Risperdal was held.  The reconsult was to evaluate psychiatric medications in anticipation of discharge to an assisted living facility.  On examination today:  The patient was noted to be a 67 year old African-American male who was laying in bed scribbling on a piece of paper.  He was alert, oriented x 2 and fairly  cooperative.  His affect was blunted.  Speech was of low volume with some latency and hesitancy.  He answered most of his questions by saying "I do not know or I do not remember."  He states that he lives alone but used to have several of his brothers help him out.  He is single never been married.  He appears slightly disorganized but fairly pleasant throughout the assessment.  He denies any current auditory or visual hallucinations or delusions.  He denies any paranoia.  He denies any active suicidal or homicidal ideations.   Recommendations: Please see below.  I agree with the PT/OT recommendation for SNF upon discharge.  Patient is unable to take care of himself independently.  He is currently not psychotic but confused.  He is fairly pleasant and cooperative.  Given that he was on 2 different injectable antipsychotics in the past, would suggest holding the Haldol decanoate but resuming oral Invega 3 mg a day while in the hospital and to recommend resuming [place] injection 256 mg IM prior to discharge for ongoing prophylactic treatment of his schizoaffective disorder.  In the long-term even the [place] can be gradually tapered to a lower dose as indicated.  Past Psychiatric History: See HPI  Risk to Self:  Denies  Risk to Others:  Denies. Prior Inpatient Therapy:   Prior Outpatient Therapy:    Past Medical History:  Past Medical History:  Diagnosis Date   Schizophrenia [facility])     Past Surgical History:  Procedure Laterality Date   COLON SURGERY  COLONOSCOPY N/A 10/06/2021   Procedure: COLONOSCOPY;  Surgeon: Toledo, Boykin Nearing, MD;  Location: [facility] ENDOSCOPY;  Service: Gastroenterology;  Laterality: N/A;   ESOPHAGOGASTRODUODENOSCOPY N/A 10/06/2021   Procedure: ESOPHAGOGASTRODUODENOSCOPY (EGD);  Surgeon: Toledo, Boykin Nearing, MD;  Location: [facility] ENDOSCOPY;  Service: Gastroenterology;  Laterality: N/A;   PARTIAL COLECTOMY  10/13/2021   Procedure: RIGHT COLECTOMY;  Surgeon:  Leafy Ro, MD;  Location: [facility] ORS;  Service: General;;   Family History:  Family History  Problem Relation Age of Onset   Lung cancer Father    Hypertension Brother    Hypertension Brother    Hypertension Brother    Prostate cancer Brother    Family Psychiatric  History:  Social History:  Social History   Substance and Sexual Activity  Alcohol Use Not Currently     Social History   Substance and Sexual Activity  Drug Use No    Social History   Socioeconomic History   Marital status: Single    Spouse name: Not on file   Number of children: Not on file   Years of education: Not on file   Highest education level: Not on file  Occupational History   Not on file  Tobacco Use   Smoking status: Every Day    Packs/day: 0.50    Years: 15.00    Additional pack years: 0.00    Total pack years: 7.50    Types: Cigarettes   Smokeless tobacco: Never  Vaping Use   Vaping Use: Never used  Substance and Sexual Activity   Alcohol use: Not Currently   Drug use: No   Sexual activity: Never    Birth control/protection: None  Other Topics Concern   Not on file  Social History Narrative   Not on file   Social Determinants of Health   Financial Resource Strain: Not on file  Food Insecurity: Not on file  Transportation Needs: Not on file  Physical Activity: Not on file  Stress: Not on file  Social Connections: Not on file   Additional Social History:    Allergies:  No Known Allergies  Labs:  Results for orders placed or performed during the hospital encounter of 11/10/22 (from the past 48 hour(s))  Glucose, capillary     Status: Abnormal   Collection Time: 11/17/22 11:24 PM  Result Value Ref Range   Glucose-Capillary 112 (H) 70 - 99 mg/dL    Comment: Glucose reference range applies only to samples taken after fasting for at least 8 hours.   Comment 1 Notify RN    Comment 2 Document in Chart   CBC     Status: Abnormal   Collection Time: 11/18/22  1:06 AM   Result Value Ref Range   WBC 4.3 4.0 - 10.5 K/uL   RBC 4.64 4.22 - 5.81 MIL/uL   Hemoglobin 12.4 (L) 13.0 - 17.0 g/dL   HCT 65.7 (L) 84.6 - 96.2 %   MCV 83.4 80.0 - 100.0 fL   MCH 26.7 26.0 - 34.0 pg   MCHC 32.0 30.0 - 36.0 g/dL   RDW 95.2 (H) 84.1 - 32.4 %   Platelets 110 (L) 150 - 400 K/uL    Comment: REPEATED TO VERIFY   nRBC 0.0 0.0 - 0.2 %    Comment: Performed at [facility], [address] [place], [place] [address]  Magnesium     Status: None   Collection Time: 11/18/22  1:06 AM  Result Value Ref Range   Magnesium 1.9 1.7 -  2.4 mg/dL    Comment: Performed at [facility], [address] [place], [place] [address]  Basic metabolic panel     Status: None   Collection Time: 11/18/22  1:06 AM  Result Value Ref Range   Sodium 137 135 - 145 mmol/L   Potassium 4.1 3.5 - 5.1 mmol/L   Chloride 102 98 - 111 mmol/L   CO2 25 22 - 32 mmol/L   Glucose, Bld 95 70 - 99 mg/dL    Comment: Glucose reference range applies only to samples taken after fasting for at least 8 hours.   BUN 12 8 - 23 mg/dL   Creatinine, Ser 3.08 0.61 - 1.24 mg/dL   Calcium 9.1 8.9 - 65.7 mg/dL   GFR, Estimated >84 >69 mL/min    Comment: (NOTE) Calculated using the CKD-EPI Creatinine Equation (2021)    Anion gap 10 5 - 15    Comment: Performed at [facility], [address], [place], [place] [address]  Glucose, capillary     Status: Abnormal   Collection Time: 11/18/22  5:58 AM  Result Value Ref Range   Glucose-Capillary 107 (H) 70 - 99 mg/dL    Comment: Glucose reference range applies only to samples taken after fasting for at least 8 hours.   Comment 1 Notify RN    Comment 2 Document in Chart   Glucose, capillary     Status: Abnormal   Collection Time: 11/18/22 11:58 AM  Result Value Ref Range   Glucose-Capillary 118 (H) 70 - 99 mg/dL    Comment: Glucose reference range applies only to samples taken after fasting for at least 8 hours.  Glucose, capillary     Status: Abnormal    Collection Time: 11/19/22 12:23 AM  Result Value Ref Range   Glucose-Capillary 118 (H) 70 - 99 mg/dL    Comment: Glucose reference range applies only to samples taken after fasting for at least 8 hours.   Comment 1 Notify RN    Comment 2 Document in Chart   Glucose, capillary     Status: None   Collection Time: 11/19/22  6:00 AM  Result Value Ref Range   Glucose-Capillary 79 70 - 99 mg/dL    Comment: Glucose reference range applies only to samples taken after fasting for at least 8 hours.   Comment 1 Notify RN    Comment 2 Document in Chart     Medications:  Current Facility-Administered Medications  Medication Dose Route Frequency Provider Last Rate Last Admin   0.9 %  sodium chloride infusion  250 mL Intravenous Continuous Adefeso, Oladapo, DO   Paused at 11/19/22 1028   acetaminophen (TYLENOL) tablet 650 mg  650 mg Oral Q6H PRN Vassie Loll, MD       Or   acetaminophen (TYLENOL) suppository 650 mg  650 mg Rectal Q6H PRN Vassie Loll, MD       ALPRAZolam Prudy Feeler) tablet 0.25 mg  0.25 mg Oral Q8H PRN Vassie Loll, MD   0.25 mg at 11/16/22 2151   Chlorhexidine Gluconate Cloth 2 % PADS 6 each  6 each Topical Daily Vassie Loll, MD   6 each at 11/19/22 1028   midodrine (PROAMATINE) tablet 2.5 mg  2.5 mg Oral TID WC Antoine Poche, MD   2.5 mg at 11/19/22 0842   nicotine (NICODERM CQ - dosed in mg/24 hours) patch 14 mg  14 mg Transdermal Daily Vassie Loll, MD   14 mg at 11/19/22 [phone]  ondansetron (ZOFRAN) tablet 4 mg  4 mg Oral Q6H PRN Vassie Loll, MD       Or   ondansetron [facility]) injection 4 mg  4 mg Intravenous Q6H PRN Vassie Loll, MD       Oral care mouth rinse  15 mL Mouth Rinse PRN Vassie Loll, MD        Musculoskeletal:  Psychiatric Specialty Exam:  Presentation  General Appearance:  Appropriate for Environment  Eye Contact: Good  Speech: Clear and Coherent  Speech Volume: Normal  Handedness: Right   Mood and Affect   Mood: Euthymic  Affect: Appropriate   Thought Process  Thought Processes: Coherent  Descriptions of Associations:Circumstantial  Orientation:Partial  Thought Content:Delusions  History of Schizophrenia/Schizoaffective disorder:Yes  Duration of Psychotic Symptoms:Greater than six months  Hallucinations:No data recorded  Ideas of Reference:None  Suicidal Thoughts:No data recorded  Homicidal Thoughts:No data recorded   Sensorium  Memory: Immediate Fair; Remote Fair  Judgment: Fair  Insight: Fair   Art therapist  Concentration: Fair  Attention Span: Fair  Recall: Good  Fund of Knowledge: Fair  Language: Fair   Psychomotor Activity  Psychomotor Activity:No data recorded   Assets  Assets: Intimacy; Desire for Improvement   Sleep  Sleep:No data recorded    Physical Exam: Physical Exam Vitals and nursing note reviewed.  Constitutional:      Appearance: Normal appearance.  Cardiovascular:     Rate and Rhythm: Normal rate and regular rhythm.  Neurological:     Mental Status: He is alert and oriented to person, place, and time.  Psychiatric:        Mood and Affect: Mood normal.        Behavior: Behavior normal.        Thought Content: Thought content normal.    ROS Blood pressure 92/65, pulse (!) 47, temperature (!) 91.5 F (33.1 C), temperature source Rectal, resp. rate 17, height 5\' 7"  (1.702 m), weight 66 kg, SpO2 99 %. Body mass index is 22.79 kg/m.  Treatment Plan Summary: Medication management The patient is currently on no antipsychotics by mouth. According to the records the patient is supposed to be getting [place] injection and Haldol Decanoate injection on 11/16/2022. He is a patient of bradycardia he is stabilized.  Recommendation was to hold Haldol decanoate given his bradycardia, however his QTc on 11/10/2022 was 388 .  Patient appears to have chronic bradycardia . Patient does not appear acutely  psychotic but may benefit from ongoing long-acting injectable maintenance treatment. Begin oral Invega 3 mg a day while in the hospital and to reassess bradycardia.  Recommend resuming Invega Sustenna 234 mg IM prior to discharge then every 4 weeks. Hold Haldol decanoate injection until further notice According to the records, cardiology has signed off on him.  PT/OT recommended SNF on discharge.  He was noted to be medically stable for discharge whenever possible. Consult team will follow.  Disposition: No evidence of imminent risk to self or others at present.   Patient does not meet criteria for psychiatric inpatient admission. Supportive therapy provided about ongoing stressors. Discussed crisis plan, support from social network, calling 911, coming to the Emergency Department, and calling Suicide Hotline.   11/19/2022 12:11 PM

## 2022-11-19 NOTE — Progress Notes (Addendum)
PROGRESS NOTE  Dustin [name]  BMW:[id number] DOB: [birth year] DOA: 11/10/2022 PCP: Sallyanne Kuster, NP   Brief Narrative: Patient is a 67 year old male with history of schizophrenia, colon cancer, diabetes, anemia who presented to [facility] initially after mechanical fall, generalized weakness.  On presentation, found to be hypothermic, bradycardic.  He was history of UTI.  Patient was admitted for the suspicion of sepsis.  Hospital course remarkable for persistent bradycardia/hypothermia, cardiology was consulted.  Psychiatry was also following for the management of schizophrenia.  Patient was transferred to [facility] for further cardiology workup regarding bradycardia.  But apparently his bradycardia has improved and cardiology signed off saying no indication of pacemaker.  PT/OT recommending SNF on discharge.  Medically stable for discharge whenever possible  Assessment & Plan:  Principal Problem:   Sepsis secondary to UTI [facility]) Active Problems:   Schizoaffective disorder, bipolar type (HCC)   Tobacco use disorder   Malignant neoplasm of ascending colon (HCC)   Pressure injury of skin   Bacteremia  Sepsis secondary to UTI/bacteremia: Presented with leukopenia, hypothermia, weakness.  Urine culture showed E. coli.  Culture showed Staph epidermidis on 1 set, likely contaminant.  Repeat cultures have been negative.    Echo negative for endocarditis.  ID was following. He will complete 10 days of ceftriaxone today.  Bradycardia: Inpatient symptomatic.  Will also be related to hypothermia, risperidone.  TSH normal.  Patient transferred to [facility] for EP evaluation but apparently his bradycardia has improved and currently signed off saying no need of pacemaker.  Avoid nodal blocking agents  Schizoaffective disorder/bipolar disorder: Psychiatry was following.  Risperidone held.  Will request for psychiatry follow-up here  to optimize medications for his schizoaffective  disorder.  Tobacco use: Continue nicotine patch.  Counseled cessation  Colon cancer: Will recommend to follow-up with oncology as soon as possible after discharge  Hypotension: Started on midodrine.  Thrombocytopenia: Stable, continue to monitor  Debility/deconditioning: PT/OT consulted, recommended SNF on discharge.  Social worker consulted and following  Pressure Injury 11/10/22 Hip Left Stage 2 -  Partial thickness loss of dermis presenting as a shallow open injury with a red, pink wound bed without slough. (Active)  11/10/22 1535  Location: Hip  Location Orientation: Left  Staging: Stage 2 -  Partial thickness loss of dermis presenting as a shallow open injury with a red, pink wound bed without slough.  Wound Description (Comments):   Present on Admission: Yes     Pressure Injury 11/16/22 Coccyx Medial Stage 2 -  Partial thickness loss of dermis presenting as a shallow open injury with a red, pink wound bed without slough. Abbrassion from what appears to be a poped blister (Active)  11/16/22 2030  Location: Coccyx  Location Orientation: Medial  Staging: Stage 2 -  Partial thickness loss of dermis presenting as a shallow open injury with a red, pink wound bed without slough.  Wound Description (Comments): Abbrassion from what appears to be a poped blister  Present on Admission: No         Pressure Injury 11/10/22 Hip Left Stage 2 -  Partial thickness loss of dermis presenting as a shallow open injury with a red, pink wound bed without slough. (Active)  11/10/22 1535  Location: Hip  Location Orientation: Left  Staging: Stage 2 -  Partial thickness loss of dermis presenting as a shallow open injury with a red, pink wound bed without slough.  Wound Description (Comments):   Present on Admission: Yes  Dressing Type  Foam - Lift dressing to assess site every shift 11/18/22 2029     Pressure Injury 11/16/22 Coccyx Medial Stage 2 -  Partial thickness loss of dermis presenting as a  shallow open injury with a red, pink wound bed without slough. Abbrassion from what appears to be a poped blister (Active)  11/16/22 2030  Location: Coccyx  Location Orientation: Medial  Staging: Stage 2 -  Partial thickness loss of dermis presenting as a shallow open injury with a red, pink wound bed without slough.  Wound Description (Comments): Abbrassion from what appears to be a poped blister  Present on Admission: No  Dressing Type Foam - Lift dressing to assess site every shift 11/18/22 2029    DVT prophylaxis:SCDs Start: 11/10/22 1433     Code Status: Full Code  Family Communication: Called brother on phone on 6/16,call not  Received  Patient status:Inpatient  Patient is from :Home  Anticipated discharge to:SNF  Estimated DC date:whenever possible  Consultants: Cardiology,psychiatry  Procedures:None  Antimicrobials:  Anti-infectives (From admission, onward)    Start     Dose/Rate Route Frequency Ordered Stop   11/17/22 1100  cefTRIAXone (ROCEPHIN) 2 g in sodium chloride 0.9 % 100 mL IVPB        2 g 200 mL/hr over 30 Minutes Intravenous Every 24 hours 11/17/22 1004 11/19/22 1058   11/17/22 1000  cefadroxil (DURICEF) capsule 500 mg  Status:  Discontinued        500 mg Oral 2 times daily 11/16/22 1123 11/17/22 1004   11/12/22 1400  vancomycin (VANCOREADY) IVPB 1250 mg/250 mL  Status:  Discontinued        1,250 mg 166.7 mL/hr over 90 Minutes Intravenous Every 24 hours 11/11/22 1328 11/14/22 1623   11/11/22 1500  vancomycin (VANCOREADY) IVPB 1500 mg/300 mL        1,500 mg 150 mL/hr over 120 Minutes Intravenous  Once 11/11/22 1328 11/11/22 1646   11/11/22 1000  cefTRIAXone (ROCEPHIN) 2 g in sodium chloride 0.9 % 100 mL IVPB  Status:  Discontinued        2 g 200 mL/hr over 30 Minutes Intravenous Every 24 hours 11/10/22 1427 11/16/22 1123   11/10/22 1215  cefTRIAXone (ROCEPHIN) 1 g in sodium chloride 0.9 % 100 mL IVPB        1 g 200 mL/hr over 30 Minutes Intravenous   Once 11/10/22 1208 11/10/22 1310       Subjective: Patient seen and examined at bedside today.  Hemodynamically stable.  Lying in bed.  Eating his breakfast.  Not in any Distress.  Heart rate in the range of 60s.He was found to be more quadrant today.   Objective: Vitals:   11/19/22 0030 11/19/22 0532 11/19/22 0554 11/19/22 0900  BP: 131/88 114/74  92/65  Pulse: (!) 50 (!) 43    Resp: 16 17    Temp: 97.6 F (36.4 C)  (!) 91.5 F (33.1 C)   TempSrc: Oral  Rectal   SpO2: 100% 99%    Weight:      Height:        Intake/Output Summary (Last 24 hours) at 11/19/2022 1125 Last data filed at 11/19/2022 0500 Gross per 24 hour  Intake 120 ml  Output 2600 ml  Net -2480 ml   Filed Weights   11/14/22 0500 11/16/22 0523 11/17/22 0405  Weight: 68.6 kg 67 kg 66 kg    Examination:  General exam: Overall comfortable, not in distress,weak, deconditioned HEENT: PERRL Respiratory system:  no  wheezes or crackles  Cardiovascular system: S1 & S2 heard, RRR.  Gastrointestinal system: Abdomen is nondistended, soft and nontender. Central nervous system: Alert and awake, oriented to place Extremities: No edema, no clubbing ,no cyanosis Skin: No rashes, no ulcers,no icterus     Data Reviewed: I have personally reviewed following labs and imaging studies  CBC: Recent Labs  Lab 11/15/22 0506 11/16/22 0522 11/18/22 0106  WBC 3.7* 4.0 4.3  HGB 11.5* 11.8* 12.4*  HCT 37.0* 37.5* 38.7*  MCV 84.9 84.1 83.4  PLT 70* 94* 110*   Basic Metabolic Panel: Recent Labs  Lab 11/13/22 0437 11/15/22 0506 11/16/22 0522 11/17/22 0533 11/18/22 0106  NA 141 140 140  --  137  K 3.8 3.2* 4.0  --  4.1  CL 110 110 111  --  102  CO2 23 23 25   --  25  GLUCOSE 64* 105* 74  --  95  BUN 14 15 15   --  12  CREATININE 1.16 1.13 1.07 0.99 0.94  CALCIUM 8.9 8.8* 8.9  --  9.1  MG  --  1.8 2.2  --  1.9  PHOS  --  2.8  --   --   --      Recent Results (from the past 240 hour(s))  Urine Culture      Status: Abnormal   Collection Time: 11/10/22 11:24 AM   Specimen: Urine, Random  Result Value Ref Range Status   Specimen Description   Final    URINE, RANDOM Performed at [facility], [address], [place], [place] [address]    Special Requests   Final    NONE Reflexed from [id number] Performed at [facility], [address], [place], [place] [address]    Culture 80,000 COLONIES/mL ESCHERICHIA COLI (A)  Final   Report Status 11/12/2022 FINAL  Final   Organism ID, Bacteria ESCHERICHIA COLI (A)  Final      Susceptibility   Escherichia coli - MIC*    AMPICILLIN <=2 SENSITIVE Sensitive     CEFAZOLIN <=4 SENSITIVE Sensitive     CEFEPIME <=0.12 SENSITIVE Sensitive     CEFTRIAXONE <=0.25 SENSITIVE Sensitive     CIPROFLOXACIN <=0.25 SENSITIVE Sensitive     GENTAMICIN <=1 SENSITIVE Sensitive     IMIPENEM <=0.25 SENSITIVE Sensitive     NITROFURANTOIN <=16 SENSITIVE Sensitive     TRIMETH/SULFA <=20 SENSITIVE Sensitive     AMPICILLIN/SULBACTAM <=2 SENSITIVE Sensitive     PIP/TAZO <=4 SENSITIVE Sensitive     * 80,000 COLONIES/mL ESCHERICHIA COLI  Blood Culture (routine x 2)     Status: Abnormal   Collection Time: 11/10/22 11:26 AM   Specimen: BLOOD RIGHT HAND  Result Value Ref Range Status   Specimen Description   Final    BLOOD RIGHT HAND BOTTLES DRAWN AEROBIC AND ANAEROBIC Performed at [facility], [address], [place], [place] [address]    Special Requests   Final    Blood Culture adequate volume Performed at [facility], [address] [place], [place] [address]    Culture  Setup Time   Final    GRAM POSITIVE COCCI ANAEROBIC BOTTLE ONLY Gram Stain Report Called to,Read Back By and Verified With: HARRIS K @ 0934 ON [id number] BY HENDERSON L CRITICAL RESULT CALLED TO, READ BACK BY AND VERIFIED WITH: Tia Masker. [id number] [id number] FCP    Culture (A)  Final    STAPHYLOCOCCUS EPIDERMIDIS THE SIGNIFICANCE OF ISOLATING THIS ORGANISM FROM A SINGLE SET OF BLOOD CULTURES WHEN  MULTIPLE SETS  ARE DRAWN IS UNCERTAIN. PLEASE NOTIFY THE MICROBIOLOGY DEPARTMENT WITHIN ONE WEEK IF SPECIATION AND SENSITIVITIES ARE REQUIRED. Performed at [facility], [address], [place], [place] [address]    Report Status 11/12/2022 FINAL  Final  Blood Culture (routine x 2)     Status: None   Collection Time: 11/10/22 11:26 AM   Specimen: Right Antecubital; Blood  Result Value Ref Range Status   Specimen Description RIGHT ANTECUBITAL BOTTLES DRAWN AEROBIC ONLY  Final   Special Requests   Final    Blood Culture results may not be optimal due to an excessive volume of blood received in culture bottles   Culture   Final    NO GROWTH 5 DAYS Performed at [facility], [address] [place], [place] [address]    Report Status 11/15/2022 FINAL  Final  Blood Culture ID Panel (Reflexed)     Status: Abnormal   Collection Time: 11/10/22 11:26 AM  Result Value Ref Range Status   Enterococcus faecalis NOT DETECTED NOT DETECTED Final   Enterococcus Faecium NOT DETECTED NOT DETECTED Final   Listeria monocytogenes NOT DETECTED NOT DETECTED Final   Staphylococcus species DETECTED (A) NOT DETECTED Final    Comment: CRITICAL RESULT CALLED TO, READ BACK BY AND VERIFIED WITH: PHARMD Robbie Louis. [id number] [id number] FCP    Staphylococcus aureus (BCID) NOT DETECTED NOT DETECTED Final   Staphylococcus epidermidis DETECTED (A) NOT DETECTED Final    Comment: Methicillin (oxacillin) resistant coagulase negative staphylococcus. Possible blood culture contaminant (unless isolated from more than one blood culture draw or clinical case suggests pathogenicity). No antibiotic treatment is indicated for blood  culture contaminants. CRITICAL RESULT CALLED TO, READ BACK BY AND VERIFIED WITH: PHARMD Robbie Louis. [id number] FCP    Staphylococcus lugdunensis NOT DETECTED NOT DETECTED Final   Streptococcus species NOT DETECTED NOT DETECTED Final   Streptococcus agalactiae NOT DETECTED NOT DETECTED Final   Streptococcus pneumoniae NOT  DETECTED NOT DETECTED Final   Streptococcus pyogenes NOT DETECTED NOT DETECTED Final   A.calcoaceticus-baumannii NOT DETECTED NOT DETECTED Final   Bacteroides fragilis NOT DETECTED NOT DETECTED Final   Enterobacterales NOT DETECTED NOT DETECTED Final   Enterobacter cloacae complex NOT DETECTED NOT DETECTED Final   Escherichia coli NOT DETECTED NOT DETECTED Final   Klebsiella aerogenes NOT DETECTED NOT DETECTED Final   Klebsiella oxytoca NOT DETECTED NOT DETECTED Final   Klebsiella pneumoniae NOT DETECTED NOT DETECTED Final   Proteus species NOT DETECTED NOT DETECTED Final   Salmonella species NOT DETECTED NOT DETECTED Final   Serratia marcescens NOT DETECTED NOT DETECTED Final   Haemophilus influenzae NOT DETECTED NOT DETECTED Final   Neisseria meningitidis NOT DETECTED NOT DETECTED Final   Pseudomonas aeruginosa NOT DETECTED NOT DETECTED Final   Stenotrophomonas maltophilia NOT DETECTED NOT DETECTED Final   Candida albicans NOT DETECTED NOT DETECTED Final   Candida auris NOT DETECTED NOT DETECTED Final   Candida glabrata NOT DETECTED NOT DETECTED Final   Candida krusei NOT DETECTED NOT DETECTED Final   Candida parapsilosis NOT DETECTED NOT DETECTED Final   Candida tropicalis NOT DETECTED NOT DETECTED Final   Cryptococcus neoformans/gattii NOT DETECTED NOT DETECTED Final   Methicillin resistance mecA/C DETECTED (A) NOT DETECTED Final    Comment: CRITICAL RESULT CALLED TO, READ BACK BY AND VERIFIED WITH: Tia Masker. 1310 [id number] FCP Performed at [facility], [address] [place], [place] [address]   MRSA Next Gen by PCR, Nasal  Status: None   Collection Time: 11/10/22  3:00 PM   Specimen: Nasal Mucosa; Nasal Swab  Result Value Ref Range Status   MRSA by PCR Next Gen NOT DETECTED NOT DETECTED Final    Comment: (NOTE) The GeneXpert MRSA Assay (FDA approved for NASAL specimens only), is one component of a comprehensive MRSA colonization surveillance program. It is not  intended to diagnose MRSA infection nor to guide or monitor treatment for MRSA infections. Test performance is not FDA approved in patients less than 89 years old. Performed at [facility], [address], [place], [place] [address]   Culture, blood (Routine X 2) w Reflex to ID Panel     Status: None   Collection Time: 11/13/22  8:59 AM   Specimen: BLOOD LEFT HAND  Result Value Ref Range Status   Specimen Description BLOOD LEFT HAND  Final   Special Requests   Final    BOTTLES DRAWN AEROBIC AND ANAEROBIC Blood Culture adequate volume   Culture   Final    NO GROWTH 5 DAYS Performed at [facility], [address], [place], [place] [address]    Report Status 11/18/2022 FINAL  Final  Culture, blood (Routine X 2) w Reflex to ID Panel     Status: None   Collection Time: 11/13/22  9:15 AM   Specimen: BLOOD RIGHT HAND  Result Value Ref Range Status   Specimen Description BLOOD RIGHT HAND  Final   Special Requests   Final    BOTTLES DRAWN AEROBIC ONLY Blood Culture adequate volume   Culture   Final    NO GROWTH 5 DAYS Performed at [facility], [address] [place], [place] [address]    Report Status 11/18/2022 FINAL  Final     Radiology Studies: No results found.  Scheduled Meds:  Chlorhexidine Gluconate Cloth  6 each Topical Daily   midodrine  2.5 mg Oral TID WC   nicotine  14 mg Transdermal Daily   Continuous Infusions:  sodium chloride 250 mL (11/19/22 1022)     LOS: 9 days   Burnadette Pop, MD [facility] P6/16/2024, 11:25 AM

## 2022-11-19 NOTE — Progress Notes (Addendum)
@  1955 pt rectal temp 92.4. All other VSS. Dr. Margo Aye, provider on-call notified and bear hugger placed per previous order. Temps periodically re-evaluated and most recently 95.8 axillary @2220 . Pt adamant about removal of heating blanket. This RN removed blanket at pt's request but endorsed we would have to replace if pt's temperature didn't continue to increase throughout shift. Pt speaking tangentially endorsing his "daddy sold me to science". Dr. Margo Aye updated; will continue to monitor.

## 2022-11-20 DIAGNOSIS — N390 Urinary tract infection, site not specified: Secondary | ICD-10-CM | POA: Diagnosis not present

## 2022-11-20 DIAGNOSIS — A419 Sepsis, unspecified organism: Secondary | ICD-10-CM | POA: Diagnosis not present

## 2022-11-20 LAB — GLUCOSE, CAPILLARY
Glucose-Capillary: 110 mg/dL — ABNORMAL HIGH (ref 70–99)
Glucose-Capillary: 122 mg/dL — ABNORMAL HIGH (ref 70–99)
Glucose-Capillary: 137 mg/dL — ABNORMAL HIGH (ref 70–99)
Glucose-Capillary: 80 mg/dL (ref 70–99)

## 2022-11-20 MED ORDER — PALIPERIDONE PALMITATE ER 234 MG/1.5ML IM SUSY
234.0000 mg | PREFILLED_SYRINGE | INTRAMUSCULAR | Status: DC
  Administered 2022-11-22: 234 mg via INTRAMUSCULAR
  Filled 2022-11-20: qty 1.5

## 2022-11-20 MED ORDER — HALOPERIDOL LACTATE 5 MG/ML IJ SOLN: 3.0000 mg | Freq: Four times a day (QID) | INTRAMUSCULAR | Status: DC | PRN

## 2022-11-20 MED ORDER — PALIPERIDONE ER 3 MG PO TB24
3.0000 mg | ORAL_TABLET | Freq: Every day | ORAL | Status: AC
  Administered 2022-11-21: 3 mg via ORAL
  Filled 2022-11-20 (×2): qty 1

## 2022-11-20 NOTE — Consult Note (Signed)
Face to Face psych Consultation   Reason for Consult:  Management of medication. Referring Physician: Hartley Barefoot MD   Patient Identification: Dustin [name]:  [id number] Principal Diagnosis: Sepsis secondary to UTI [facility]) Diagnosis:  Principal Problem:   Sepsis secondary to UTI [facility]) Active Problems:   Tobacco use disorder   Schizoaffective disorder, bipolar type (HCC)   Malignant neoplasm of ascending colon (HCC)   Pressure injury of skin   Bacteremia   Total Time spent with patient: 30 minutes  HPI:  Per admission assessment note: " Dustin [name] is a 67 y.o. male.  He is brought in by EMS for possible code sepsis.  He reportedly had a fall overnight at his facility and was put back to bed.  This morning is complaining of right shoulder and right hip pain.  EMS noted blood pressure low, bradycardic, low temperature."   Subjective:   Dustin [name] is a 67 y.o. male who was seen and reevaluated.  He was initially seen by telemetry assessment on 11/15/18/2024 for a medication assessment.  One of the considerations was the fact that the patient may be bradycardic secondary to medications.   On examination today:  The patient was noted to be a 67 year old African-American male who was laying in bed asking for help to get up out of bed and into the chair. Patient initially was gaurded but welcoming. He does endorse a sense of security when interacting with persons of color. Other times his paranoia and delusions creates beliefs that he is being experimented on and "white people are corrupt."  He was alert, oriented x 3 (place, date, and time) and fairly cooperative. He is able to read the clock and calendar and answer his orientation questions with no assistance.  His affect was blunted.  Speech was of low volume with some latency and hesitancy, he states he whispers so that others in the hallway can not hear him. He does agree to take his medications for this provider and  states it is ok to resume it. He is interested in resuming his injection of Invega.  He appears slightly disorganized but fairly pleasant throughout the assessment.  He denies any current auditory or visual hallucinations or delusions.  He denies any paranoia.  He denies any active suicidal or homicidal ideations.   Past Psychiatric History: See HPI  Risk to Self:  Denies  Risk to Others:  Denies. Prior Inpatient Therapy:   Prior Outpatient Therapy:    Past Medical History:  Past Medical History:  Diagnosis Date   Schizophrenia [facility])     Past Surgical History:  Procedure Laterality Date   COLON SURGERY     COLONOSCOPY N/A 10/06/2021   Procedure: COLONOSCOPY;  Surgeon: Toledo, Boykin Nearing, MD;  Location: [facility] ENDOSCOPY;  Service: Gastroenterology;  Laterality: N/A;   ESOPHAGOGASTRODUODENOSCOPY N/A 10/06/2021   Procedure: ESOPHAGOGASTRODUODENOSCOPY (EGD);  Surgeon: Toledo, Boykin Nearing, MD;  Location: [facility] ENDOSCOPY;  Service: Gastroenterology;  Laterality: N/A;   PARTIAL COLECTOMY  10/13/2021   Procedure: RIGHT COLECTOMY;  Surgeon: Leafy Ro, MD;  Location: [facility] ORS;  Service: General;;   Family History:  Family History  Problem Relation Age of Onset   Lung cancer Father    Hypertension Brother    Hypertension Brother    Hypertension Brother    Prostate cancer Brother    Family Psychiatric  History:  Social History:  Social History   Substance and Sexual Activity  Alcohol Use Not Currently  Social History   Substance and Sexual Activity  Drug Use No    Social History   Socioeconomic History   Marital status: Single    Spouse name: Not on file   Number of children: Not on file   Years of education: Not on file   Highest education level: Not on file  Occupational History   Not on file  Tobacco Use   Smoking status: Every Day    Packs/day: 0.50    Years: 15.00    Additional pack years: 0.00    Total pack years: 7.50    Types: Cigarettes   Smokeless  tobacco: Never  Vaping Use   Vaping Use: Never used  Substance and Sexual Activity   Alcohol use: Not Currently   Drug use: No   Sexual activity: Never    Birth control/protection: None  Other Topics Concern   Not on file  Social History Narrative   Not on file   Social Determinants of Health   Financial Resource Strain: Not on file  Food Insecurity: Not on file  Transportation Needs: Not on file  Physical Activity: Not on file  Stress: Not on file  Social Connections: Not on file   Additional Social History:    Allergies:  No Known Allergies  Labs:  Results for orders placed or performed during the hospital encounter of 11/10/22 (from the past 48 hour(s))  Glucose, capillary     Status: Abnormal   Collection Time: 11/19/22 12:23 AM  Result Value Ref Range   Glucose-Capillary 118 (H) 70 - 99 mg/dL    Comment: Glucose reference range applies only to samples taken after fasting for at least 8 hours.   Comment 1 Notify RN    Comment 2 Document in Chart   Glucose, capillary     Status: None   Collection Time: 11/19/22  6:00 AM  Result Value Ref Range   Glucose-Capillary 79 70 - 99 mg/dL    Comment: Glucose reference range applies only to samples taken after fasting for at least 8 hours.   Comment 1 Notify RN    Comment 2 Document in Chart   Glucose, capillary     Status: Abnormal   Collection Time: 11/19/22 12:52 PM  Result Value Ref Range   Glucose-Capillary 109 (H) 70 - 99 mg/dL    Comment: Glucose reference range applies only to samples taken after fasting for at least 8 hours.  Glucose, capillary     Status: Abnormal   Collection Time: 11/20/22 12:00 AM  Result Value Ref Range   Glucose-Capillary 137 (H) 70 - 99 mg/dL    Comment: Glucose reference range applies only to samples taken after fasting for at least 8 hours.   Comment 1 Document in Chart   Glucose, capillary     Status: None   Collection Time: 11/20/22  6:25 AM  Result Value Ref Range    Glucose-Capillary 80 70 - 99 mg/dL    Comment: Glucose reference range applies only to samples taken after fasting for at least 8 hours.   Comment 1 Document in Chart     Medications:  Current Facility-Administered Medications  Medication Dose Route Frequency Provider Last Rate Last Admin   0.9 %  sodium chloride infusion  250 mL Intravenous Continuous Adefeso, Oladapo, DO   Stopped at 11/19/22 1857   acetaminophen (TYLENOL) tablet 650 mg  650 mg Oral Q6H PRN Vassie Loll, MD       Or   acetaminophen (TYLENOL) suppository  650 mg  650 mg Rectal Q6H PRN Vassie Loll, MD       ALPRAZolam Prudy Feeler) tablet 0.25 mg  0.25 mg Oral Q8H PRN Vassie Loll, MD   0.25 mg at 11/16/22 2151   Chlorhexidine Gluconate Cloth 2 % PADS 6 each  6 each Topical Daily Vassie Loll, MD   6 each at 11/19/22 1028   haloperidol lactate (HALDOL) injection 3 mg  3 mg Intravenous Q6H PRN Burnadette Pop, MD       midodrine (PROAMATINE) tablet 2.5 mg  2.5 mg Oral TID WC Antoine Poche, MD   2.5 mg at 11/19/22 1854   nicotine (NICODERM CQ - dosed in mg/24 hours) patch 14 mg  14 mg Transdermal Daily Vassie Loll, MD   14 mg at 11/20/22 1258   ondansetron (ZOFRAN) tablet 4 mg  4 mg Oral Q6H PRN Vassie Loll, MD       Or   ondansetron [facility]) injection 4 mg  4 mg Intravenous Q6H PRN Vassie Loll, MD       Oral care mouth rinse  15 mL Mouth Rinse PRN Vassie Loll, MD       Melene Muller ON 11/22/2022] paliperidone (INVEGA SUSTENNA) injection 234 mg  234 mg Intramuscular Q28 days Rex Kras, MD       paliperidone (INVEGA) 24 hr tablet 3 mg  3 mg Oral Daily Rex Kras, MD        Musculoskeletal:  Psychiatric Specialty Exam:  Presentation  General Appearance:  Appropriate for Environment  Eye Contact: Good  Speech: Clear and Coherent  Speech Volume: low  Handedness: Right   Mood and Affect  Mood: Euthymic  Affect: Appropriate   Thought Process  Thought  Processes: Coherent  Descriptions of Associations:Circumstantial  Orientation:parital( x3)   Thought Content:Delusions  History of Schizophrenia/Schizoaffective disorder:Yes  Duration of Psychotic Symptoms:Greater than six months  Hallucinations:No data recorded  Ideas of Reference:paranoia and delusions  Suicidal Thoughts:No data recorded  Homicidal Thoughts:No data recorded   Sensorium  Memory: Immediate Fair; Remote Fair  Judgment: Fair  Insight: Fair   Art therapist  Concentration: Fair  Attention Span: Fair  Recall: Good  Fund of Knowledge: Fair  Language: Fair   Psychomotor Activity  Psychomotor Activity:No data recorded   Assets  Assets: Intimacy; Desire for Improvement   Sleep  Sleep:No data recorded    Physical Exam: Physical Exam Vitals and nursing note reviewed.  Constitutional:      Appearance: Normal appearance.  Cardiovascular:     Rate and Rhythm: Normal rate and regular rhythm.  Neurological:     Mental Status: He is alert and oriented to person, place, and time.  Psychiatric:        Mood and Affect: Mood normal.        Behavior: Behavior normal.        Thought Content: Thought content normal.    ROS Blood pressure (!) 117/94, pulse 68, temperature (!) 97.3 F (36.3 C), temperature source Oral, resp. rate 17, height 5\' 7"  (1.702 m), weight 66 kg, SpO2 100 %. Body mass index is 22.79 kg/m.  Treatment Plan Summary: Medication management According to the records the patient is supposed to be getting [place] injection and Haldol Decanoate injection on 11/16/2022. He is a patient of bradycardia he is stabilized.  Recommendation was to hold Haldol decanoate given his bradycardia, however his QTc on 11/10/2022 was 388 .  Patient appears to have chronic bradycardia . Patient does not appear acutely psychotic but may  benefit from ongoing long-acting injectable maintenance treatment. Begin oral Invega 3 mg a day  x 2 doses.   Recommend resuming Invega Sustenna 234 mg IM has been ordered for 06/19. Continue prn IV haldol.  While his request may not be doable, please try to staff patient with persons who he will feel safe with. This may help cut down on his agitation and improve his paranoia.  According to the records, cardiology has signed off on him.  PT/OT recommended SNF on discharge.  He was noted to be medically stable for discharge whenever possible. Consult team will follow.  Disposition: No evidence of imminent risk to self or others at present.   Patient does not meet criteria for psychiatric inpatient admission. Supportive therapy provided about ongoing stressors. Discussed crisis plan, support from social network, calling 911, coming to the Emergency Department, and calling Suicide Hotline.   11/20/2022 3:55 PM

## 2022-11-20 NOTE — TOC Progression Note (Signed)
Transition of Care [facility]) - Progression Note    Patient Details  Name: Dustin [name] MRN: [id number] Date of Birth: Oct 21, 1955  Transition of Care [facility]) CM/SW Contact  Eduard Roux, [place] Phone Number: 11/20/2022, 2:47 PM  Clinical Narrative:     Patient has no bed offers.   CSW sent out more referrals  TOC will continue to follow and assist with discharge planning.  Antony Blackbird, MSW, LCSW Clinical Social Worker    Expected Discharge Plan: Skilled Nursing Facility Barriers to Discharge: Continued Medical Work up  Expected Discharge Plan and Services                                               Social Determinants of Health (SDOH) Interventions SDOH Screenings   Housing: High Risk (07/21/2022)  Alcohol Screen: Low Risk  (03/06/2022)  Depression (PHQ2-9): Low Risk  (03/06/2022)  Tobacco Use: High Risk (11/16/2022)    Readmission Risk Interventions     No data to display

## 2022-11-20 NOTE — Progress Notes (Signed)
Patient not oriented to self, place, time or situation. Patient refused medications, nicotine patch and CHG bath. Agitated and grabbing at RNs arm. Charge nurse notified.   Kenard Gower, RN

## 2022-11-20 NOTE — Progress Notes (Addendum)
PROGRESS NOTE  Dustin [name]  ZOX:[id number] DOB: 1955/06/19 DOA: 11/10/2022 PCP: Sallyanne Kuster, NP   Brief Narrative: Patient is a 67 year old male with history of schizophrenia, colon cancer, diabetes, anemia who presented to [facility] initially after mechanical fall, generalized weakness.  On presentation, found to be hypothermic, bradycardic.  He was history of UTI.  Patient was admitted for the suspicion of sepsis.  Hospital course remarkable for persistent bradycardia/hypothermia, cardiology was consulted.  Psychiatry was also following for the management of schizophrenia.  Patient was transferred to [facility] for further cardiology workup regarding bradycardia.  But apparently his bradycardia has improved and cardiology signed off saying no indication of pacemaker.  PT/OT recommending SNF on discharge.    Assessment & Plan:  Principal Problem:   Sepsis secondary to UTI [facility]) Active Problems:   Schizoaffective disorder, bipolar type (HCC)   Tobacco use disorder   Malignant neoplasm of ascending colon (HCC)   Pressure injury of skin   Bacteremia  Sepsis secondary to UTI/bacteremia: Presented with leukopenia, hypothermia, weakness.  Urine culture showed E. coli.  Culture showed Staph epidermidis on 1 set, likely contaminant.  Repeat cultures have been negative.    Echo negative for endocarditis.  ID was following. He completed abx course  Hypothermia/Bradycardia: Bradycardia thought to be related hypothermia, risperidone.  TSH normal.  Patient transferred to [facility] for EP evaluation but apparently his bradycardia has improved and currently signed off saying no need of pacemaker.  Avoid nodal blocking agents. He is intermittently hypothermic, etiology not clear.  Sepsis has been already treated with antibiotics.  Schizoaffective disorder/bipolar disorder: Psychiatry was following.  Risperidone held.  Psychiatry recommending to continue Invega.  He needs to  follow-up with psychiatry as an outpatient.  He  remains confused  Tobacco use: Continue nicotine patch.  Counseled cessation  Colon cancer: Will recommend to follow-up with oncology as soon as possible after discharge  Hypotension: Started on midodrine.  Thrombocytopenia: Stable, continue to monitor  Debility/deconditioning: PT/OT consulted, recommended SNF on discharge.  Social worker consulted and following  Pressure Injury 11/10/22 Hip Left Stage 2 -  Partial thickness loss of dermis presenting as a shallow open injury with a red, pink wound bed without slough. (Active)  11/10/22 1535  Location: Hip  Location Orientation: Left  Staging: Stage 2 -  Partial thickness loss of dermis presenting as a shallow open injury with a red, pink wound bed without slough.  Wound Description (Comments):   Present on Admission: Yes     Pressure Injury 11/16/22 Coccyx Medial Stage 2 -  Partial thickness loss of dermis presenting as a shallow open injury with a red, pink wound bed without slough. Abbrassion from what appears to be a poped blister (Active)  11/16/22 2030  Location: Coccyx  Location Orientation: Medial  Staging: Stage 2 -  Partial thickness loss of dermis presenting as a shallow open injury with a red, pink wound bed without slough.  Wound Description (Comments): Abbrassion from what appears to be a poped blister  Present on Admission: No         Pressure Injury 11/10/22 Hip Left Stage 2 -  Partial thickness loss of dermis presenting as a shallow open injury with a red, pink wound bed without slough. (Active)  11/10/22 1535  Location: Hip  Location Orientation: Left  Staging: Stage 2 -  Partial thickness loss of dermis presenting as a shallow open injury with a red, pink wound bed without slough.  Wound Description (Comments):  Present on Admission: Yes  Dressing Type Foam - Lift dressing to assess site every shift 11/19/22 1950     Pressure Injury 11/16/22 Coccyx Medial  Stage 2 -  Partial thickness loss of dermis presenting as a shallow open injury with a red, pink wound bed without slough. Abbrassion from what appears to be a poped blister (Active)  11/16/22 2030  Location: Coccyx  Location Orientation: Medial  Staging: Stage 2 -  Partial thickness loss of dermis presenting as a shallow open injury with a red, pink wound bed without slough.  Wound Description (Comments): Abbrassion from what appears to be a poped blister  Present on Admission: No  Dressing Type Foam - Lift dressing to assess site every shift 11/19/22 1950    DVT prophylaxis:SCDs Start: 11/10/22 1433     Code Status: Full Code  Family Communication: Called brother and discussed  on phone on 6/17  Patient status:Inpatient  Patient is from :Home  Anticipated discharge to:SNF  Estimated DC date:whenever possible  Consultants: Cardiology,psychiatry  Procedures:None  Antimicrobials:  Anti-infectives (From admission, onward)    Start     Dose/Rate Route Frequency Ordered Stop   11/17/22 1100  cefTRIAXone (ROCEPHIN) 2 g in sodium chloride 0.9 % 100 mL IVPB        2 g 200 mL/hr over 30 Minutes Intravenous Every 24 hours 11/17/22 1004 11/19/22 1100   11/17/22 1000  cefadroxil (DURICEF) capsule 500 mg  Status:  Discontinued        500 mg Oral 2 times daily 11/16/22 1123 11/17/22 1004   11/12/22 1400  vancomycin (VANCOREADY) IVPB 1250 mg/250 mL  Status:  Discontinued        1,250 mg 166.7 mL/hr over 90 Minutes Intravenous Every 24 hours 11/11/22 1328 11/14/22 1623   11/11/22 1500  vancomycin (VANCOREADY) IVPB 1500 mg/300 mL        1,500 mg 150 mL/hr over 120 Minutes Intravenous  Once 11/11/22 1328 11/11/22 1646   11/11/22 1000  cefTRIAXone (ROCEPHIN) 2 g in sodium chloride 0.9 % 100 mL IVPB  Status:  Discontinued        2 g 200 mL/hr over 30 Minutes Intravenous Every 24 hours 11/10/22 1427 11/16/22 1123   11/10/22 1215  cefTRIAXone (ROCEPHIN) 1 g in sodium chloride 0.9 % 100 mL  IVPB        1 g 200 mL/hr over 30 Minutes Intravenous  Once 11/10/22 1208 11/10/22 1310       Subjective: Patient seen and examined at bedside today.  Hemodynamically stable lying in bed.  He remains confused today.  Not that agitated.  Heart rate stable in the range of 70-80  Objective: Vitals:   11/19/22 2220 11/19/22 2352 11/20/22 0343 11/20/22 0831  BP:  97/63 94/79 (!) 117/94  Pulse: 81 71 77 68  Resp: 18 16 17 17   Temp: (!) 95.8 F (35.4 C) (!) 97.5 F (36.4 C) (!) 97.5 F (36.4 C) (!) 97.3 F (36.3 C)  TempSrc: Axillary Oral Oral Oral  SpO2: 94% 100% 100% 100%  Weight:      Height:        Intake/Output Summary (Last 24 hours) at 11/20/2022 1148 Last data filed at 11/20/2022 0300 Gross per 24 hour  Intake 321.77 ml  Output 750 ml  Net -428.23 ml   Filed Weights   11/14/22 0500 11/16/22 0523 11/17/22 0405  Weight: 68.6 kg 67 kg 66 kg    Examination:  General exam: Overall comfortable, not in distress,deconditioned  HEENT: PERRL Respiratory system:  no wheezes or crackles  Cardiovascular system: S1 & S2 heard, RRR.  Gastrointestinal system: Abdomen is nondistended, soft and nontender. Central nervous system: Alert and awake but confused Extremities: No edema, no clubbing ,no cyanosis Skin: No rashes, no ulcers,no icterus    Data Reviewed: I have personally reviewed following labs and imaging studies  CBC: Recent Labs  Lab 11/15/22 0506 11/16/22 0522 11/18/22 0106  WBC 3.7* 4.0 4.3  HGB 11.5* 11.8* 12.4*  HCT 37.0* 37.5* 38.7*  MCV 84.9 84.1 83.4  PLT 70* 94* 110*   Basic Metabolic Panel: Recent Labs  Lab 11/15/22 0506 11/16/22 0522 11/17/22 0533 11/18/22 0106  NA 140 140  --  137  K 3.2* 4.0  --  4.1  CL 110 111  --  102  CO2 23 25  --  25  GLUCOSE 105* 74  --  95  BUN 15 15  --  12  CREATININE 1.13 1.07 0.99 0.94  CALCIUM 8.8* 8.9  --  9.1  MG 1.8 2.2  --  1.9  PHOS 2.8  --   --   --      Recent Results (from the past 240 hour(s))   MRSA Next Gen by PCR, Nasal     Status: None   Collection Time: 11/10/22  3:00 PM   Specimen: Nasal Mucosa; Nasal Swab  Result Value Ref Range Status   MRSA by PCR Next Gen NOT DETECTED NOT DETECTED Final    Comment: (NOTE) The GeneXpert MRSA Assay (FDA approved for NASAL specimens only), is one component of a comprehensive MRSA colonization surveillance program. It is not intended to diagnose MRSA infection nor to guide or monitor treatment for MRSA infections. Test performance is not FDA approved in patients less than 43 years old. Performed at [facility], [address] [place], [place] [address]   Culture, blood (Routine X 2) w Reflex to ID Panel     Status: None   Collection Time: 11/13/22  8:59 AM   Specimen: BLOOD LEFT HAND  Result Value Ref Range Status   Specimen Description BLOOD LEFT HAND  Final   Special Requests   Final    BOTTLES DRAWN AEROBIC AND ANAEROBIC Blood Culture adequate volume   Culture   Final    NO GROWTH 5 DAYS Performed at [facility], [address], [place], [place] [address]    Report Status 11/18/2022 FINAL  Final  Culture, blood (Routine X 2) w Reflex to ID Panel     Status: None   Collection Time: 11/13/22  9:15 AM   Specimen: BLOOD RIGHT HAND  Result Value Ref Range Status   Specimen Description BLOOD RIGHT HAND  Final   Special Requests   Final    BOTTLES DRAWN AEROBIC ONLY Blood Culture adequate volume   Culture   Final    NO GROWTH 5 DAYS Performed at [facility], [address], [place], [place] [address]    Report Status 11/18/2022 FINAL  Final     Radiology Studies: No results found.  Scheduled Meds:  Chlorhexidine Gluconate Cloth  6 each Topical Daily   midodrine  2.5 mg Oral TID WC   nicotine  14 mg Transdermal Daily   [START ON 11/22/2022] paliperidone  234 mg Intramuscular Q28 days   paliperidone  3 mg Oral Daily   Continuous Infusions:  sodium chloride Stopped (11/19/22 1857)     LOS: 10 days   Burnadette Pop,  MD [facility]  P6/17/2024, 11:48 AM

## 2022-11-20 NOTE — Progress Notes (Signed)
PT Cancellation Note  Patient Details Name: Dustin [name] MRN: [id number] DOB: 03/19/1956   Cancelled Treatment:    Reason Eval/Treat Not Completed: Patient declined, no reason specified;Other (comment). Per RN, pt very agitated today, grabbing RN's arm when attempting to place Nicotine patch and using verbal explicatives with NT when she was attempting to assist pt with eating lunch. Pt's RN recommending PT attempt another date when pt is less agitated. PT will continue to follow-up with pt acutely as available and appropriate.      Alessandra Bevels [place] 11/20/2022, 12:40 PM

## 2022-11-21 DIAGNOSIS — A419 Sepsis, unspecified organism: Secondary | ICD-10-CM | POA: Diagnosis not present

## 2022-11-21 DIAGNOSIS — N390 Urinary tract infection, site not specified: Secondary | ICD-10-CM | POA: Diagnosis not present

## 2022-11-21 LAB — GLUCOSE, CAPILLARY
Glucose-Capillary: 86 mg/dL (ref 70–99)
Glucose-Capillary: 115 mg/dL — ABNORMAL HIGH (ref 70–99)
Glucose-Capillary: 139 mg/dL — ABNORMAL HIGH (ref 70–99)

## 2022-11-21 MED ORDER — PALIPERIDONE ER 3 MG PO TB24
3.0000 mg | ORAL_TABLET | Freq: Every day | ORAL | Status: DC
  Administered 2022-11-22 – 2022-11-23 (×2): 3 mg via ORAL
  Filled 2022-11-21 (×3): qty 1

## 2022-11-21 NOTE — Consult Note (Signed)
Face to Face psych Consultation   Reason for Consult:  Management of medication. Referring Physician: Hartley Barefoot MD   Patient Identification: Sierra Wenzlick Ayyad MRN:  [id number] Principal Diagnosis: Sepsis secondary to UTI [facility]) Diagnosis:  Principal Problem:   Sepsis secondary to UTI [facility]) Active Problems:   Tobacco use disorder   Schizoaffective disorder, bipolar type (HCC)   Malignant neoplasm of ascending colon (HCC)   Pressure injury of skin   Bacteremia   Total Time spent with patient: 30 minutes  HPI:  Per admission assessment note: " Marquie Perel is a 67 y.o. male.  He is brought in by EMS for possible code sepsis.  He reportedly had a fall overnight at his facility and was put back to bed.  This morning is complaining of right shoulder and right hip pain.  EMS noted blood pressure low, bradycardic, low temperature."   Subjective:   Thad Derick is a 67 y.o. male who was seen and reevaluated.  He was initially seen by telemetry assessment on 11/15/18/2024 for a medication assessment.  One of the considerations was the fact that the patient may be bradycardic secondary to medications.   On examination today:  During evaluation Milek Crissman Locker is sitting upright in chair in no acute distress.  He is alert/oriented x 3; calm/cooperative; and mood congruent with affect.  He is smiling and motions for provider to enter the room. After donning PPE, he motions for provider to come assist him with writing something down. Upon further notice he requests that this provider writer ([employer], [employer] ) on his newly issued bible. There are other words already written on his bible. There does not appear to be any connection with the words written. He is speaking in a clear tone at moderate volume, and slow pace; with good eye contact.  His thought process is coherent and relevant; There is no indication that he is currently responding to internal/external stimuli or  experiencing delusional thought content; and he has denied suicidal/self-harm/homicidal ideation, psychosis, and paranoia.  Patient has remained calm throughout assessment and has answered questions appropriately.   He reports compliance with this morning medication, chart reviewed verified. Chart review further shows he has only received one dose of paliperidone.   Past Psychiatric History: See HPI  Risk to Self:  Denies  Risk to Others:  Denies. Prior Inpatient Therapy:   Prior Outpatient Therapy:    Past Medical History:  Past Medical History:  Diagnosis Date   Schizophrenia [facility])     Past Surgical History:  Procedure Laterality Date   COLON SURGERY     COLONOSCOPY N/A 10/06/2021   Procedure: COLONOSCOPY;  Surgeon: Toledo, Boykin Nearing, MD;  Location: [facility] ENDOSCOPY;  Service: Gastroenterology;  Laterality: N/A;   ESOPHAGOGASTRODUODENOSCOPY N/A 10/06/2021   Procedure: ESOPHAGOGASTRODUODENOSCOPY (EGD);  Surgeon: Toledo, Boykin Nearing, MD;  Location: [facility] ENDOSCOPY;  Service: Gastroenterology;  Laterality: N/A;   PARTIAL COLECTOMY  10/13/2021   Procedure: RIGHT COLECTOMY;  Surgeon: Leafy Ro, MD;  Location: [facility] ORS;  Service: General;;   Family History:  Family History  Problem Relation Age of Onset   Lung cancer Father    Hypertension Brother    Hypertension Brother    Hypertension Brother    Prostate cancer Brother    Family Psychiatric  History:  Social History:  Social History   Substance and Sexual Activity  Alcohol Use Not Currently     Social History   Substance and Sexual Activity  Drug  Use No    Social History   Socioeconomic History   Marital status: Single    Spouse name: Not on file   Number of children: Not on file   Years of education: Not on file   Highest education level: Not on file  Occupational History   Not on file  Tobacco Use   Smoking status: Every Day    Packs/day: 0.50    Years: 15.00    Additional pack years: 0.00    Total pack  years: 7.50    Types: Cigarettes   Smokeless tobacco: Never  Vaping Use   Vaping Use: Never used  Substance and Sexual Activity   Alcohol use: Not Currently   Drug use: No   Sexual activity: Never    Birth control/protection: None  Other Topics Concern   Not on file  Social History Narrative   Not on file   Social Determinants of Health   Financial Resource Strain: Not on file  Food Insecurity: Not on file  Transportation Needs: Not on file  Physical Activity: Not on file  Stress: Not on file  Social Connections: Not on file   Additional Social History:    Allergies:  No Known Allergies  Labs:  Results for orders placed or performed during the hospital encounter of 11/10/22 (from the past 48 hour(s))  Glucose, capillary     Status: Abnormal   Collection Time: 11/20/22 12:00 AM  Result Value Ref Range   Glucose-Capillary 137 (H) 70 - 99 mg/dL    Comment: Glucose reference range applies only to samples taken after fasting for at least 8 hours.   Comment 1 Document in Chart   Glucose, capillary     Status: None   Collection Time: 11/20/22  6:25 AM  Result Value Ref Range   Glucose-Capillary 80 70 - 99 mg/dL    Comment: Glucose reference range applies only to samples taken after fasting for at least 8 hours.   Comment 1 Document in Chart   Glucose, capillary     Status: Abnormal   Collection Time: 11/20/22  5:51 PM  Result Value Ref Range   Glucose-Capillary 122 (H) 70 - 99 mg/dL    Comment: Glucose reference range applies only to samples taken after fasting for at least 8 hours.  Glucose, capillary     Status: Abnormal   Collection Time: 11/20/22 11:37 PM  Result Value Ref Range   Glucose-Capillary 110 (H) 70 - 99 mg/dL    Comment: Glucose reference range applies only to samples taken after fasting for at least 8 hours.  Glucose, capillary     Status: None   Collection Time: 11/21/22  6:16 AM  Result Value Ref Range   Glucose-Capillary 86 70 - 99 mg/dL     Comment: Glucose reference range applies only to samples taken after fasting for at least 8 hours.   Comment 1 Notify RN    Comment 2 Document in Chart   Glucose, capillary     Status: Abnormal   Collection Time: 11/21/22 11:54 AM  Result Value Ref Range   Glucose-Capillary 115 (H) 70 - 99 mg/dL    Comment: Glucose reference range applies only to samples taken after fasting for at least 8 hours.    Medications:  Current Facility-Administered Medications  Medication Dose Route Frequency Provider Last Rate Last Admin   0.9 %  sodium chloride infusion  250 mL Intravenous Continuous Adefeso, Oladapo, DO   Stopped at 11/19/22 1857   acetaminophen (  TYLENOL) tablet 650 mg  650 mg Oral Q6H PRN Vassie Loll, MD       Or   acetaminophen (TYLENOL) suppository 650 mg  650 mg Rectal Q6H PRN Vassie Loll, MD       ALPRAZolam Prudy Feeler) tablet 0.25 mg  0.25 mg Oral Q8H PRN Vassie Loll, MD   0.25 mg at 11/16/22 2151   Chlorhexidine Gluconate Cloth 2 % PADS 6 each  6 each Topical Daily Vassie Loll, MD   6 each at 11/21/22 0800   haloperidol lactate (HALDOL) injection 3 mg  3 mg Intravenous Q6H PRN Burnadette Pop, MD       midodrine (PROAMATINE) tablet 2.5 mg  2.5 mg Oral TID WC Antoine Poche, MD   2.5 mg at 11/19/22 1854   nicotine (NICODERM CQ - dosed in mg/24 hours) patch 14 mg  14 mg Transdermal Daily Vassie Loll, MD   14 mg at 11/21/22 0758   ondansetron (ZOFRAN) tablet 4 mg  4 mg Oral Q6H PRN Vassie Loll, MD       Or   ondansetron [facility]) injection 4 mg  4 mg Intravenous Q6H PRN Vassie Loll, MD       Oral care mouth rinse  15 mL Mouth Rinse PRN Vassie Loll, MD       Melene Muller ON 11/22/2022] paliperidone (INVEGA SUSTENNA) injection 234 mg  234 mg Intramuscular Q28 days Rex Kras, MD       paliperidone (INVEGA) 24 hr tablet 3 mg  3 mg Oral Daily Rex Kras, MD   3 mg at 11/21/22 1007    Musculoskeletal:  Psychiatric Specialty Exam:  Presentation  General  Appearance:  Appropriate for Environment; Casual  Eye Contact: Good  Speech: Clear and Coherent; Slow  Speech Volume: low  Handedness: Right   Mood and Affect  Mood: Euthymic  Affect: Appropriate; Congruent   Thought Process  Thought Processes: Coherent; Linear  Descriptions of Associations:Intact  Orientation:parital( x3)   Thought Content:Paranoid Ideation; Delusions  History of Schizophrenia/Schizoaffective disorder:Yes  Duration of Psychotic Symptoms:Greater than six months  Hallucinations:Hallucinations: None   Ideas of Reference:paranoia and delusions  Suicidal Thoughts:Suicidal Thoughts: No   Homicidal Thoughts:Homicidal Thoughts: No    Sensorium  Memory: Immediate Fair; Recent Fair; Remote Fair  Judgment: Impaired  Insight: Shallow   Executive Functions  Concentration: Fair  Attention Span: Fair  Recall: Fair  Fund of Knowledge: Good  Language: Fair   Psychomotor Activity  Psychomotor Activity:Psychomotor Activity: Normal    Assets  Assets: Communication Skills; Housing   Sleep  Sleep:Sleep: Fair     Physical Exam: Physical Exam Vitals and nursing note reviewed.  Constitutional:      Appearance: Normal appearance.  Cardiovascular:     Rate and Rhythm: Normal rate and regular rhythm.  Neurological:     Mental Status: He is alert and oriented to person, place, and time.  Psychiatric:        Mood and Affect: Mood normal.        Behavior: Behavior normal.        Thought Content: Thought content normal.    ROS Blood pressure 132/84, pulse 62, temperature (!) 97.4 F (36.3 C), temperature source Oral, resp. rate 14, height 5\' 7"  (1.702 m), weight 66 kg, SpO2 100 %. Body mass index is 22.79 kg/m.  Treatment Plan Summary: Medication management According to the records the patient is supposed to be getting [place] injection and Haldol Decanoate injection on 11/16/2022.  He is a patient of  bradycardia he is stabilized.  Recommendation was to hold Haldol decanoate given his bradycardia, however his QTc on 11/10/2022 was 388 .  Patient appears to have chronic bradycardia.  Continue oral Invega 3 mg a day x 2 doses. Chart reviewed showed patient only received one dose of Invega, will order.   Recommend resuming Invega Sustenna 234 mg IM has been ordered for 06/19.  Continue prn IV haldol.   While his request may not be doable, please try to staff patient with persons who he will feel safe with. This may help cut down on his agitation and improve his paranoia.     He was noted to be medically stable for discharge whenever possible. Consult team will follow from a distance.   Disposition: No evidence of imminent risk to self or others at present.   Patient does not meet criteria for psychiatric inpatient admission. Supportive therapy provided about ongoing stressors. Discussed crisis plan, support from social network, calling 911, coming to the Emergency Department, and calling Suicide Hotline.   11/21/2022 1:11 PM

## 2022-11-21 NOTE — TOC Progression Note (Signed)
Transition of Care [facility]) - Progression Note    Patient Details  Name: Dustin [name] MRN: [id number] Date of Birth: 1956/05/16  Transition of Care [facility]) CM/SW Contact  Eduard Roux, [place] Phone Number: 11/21/2022, 11:44 AM  Clinical Narrative:     Patient ha no bed offers- TOC will continue to follow and assist with discharge planning.   Antony Blackbird, MSW, LCSW Clinical Social Worker    Expected Discharge Plan: Skilled Nursing Facility Barriers to Discharge: Continued Medical Work up  Expected Discharge Plan and Services                                               Social Determinants of Health (SDOH) Interventions SDOH Screenings   Housing: High Risk (07/21/2022)  Alcohol Screen: Low Risk  (03/06/2022)  Depression (PHQ2-9): Low Risk  (03/06/2022)  Tobacco Use: High Risk (11/16/2022)    Readmission Risk Interventions     No data to display

## 2022-11-21 NOTE — Progress Notes (Addendum)
Patient pleasant during bedside report. Pt A/O x 4. Pt speaks softly and difficult to hear.  Ordered breakfast and will await food to take his medication. CHG bath and gown and linen changed. Agreeable to new nicotine patch. Requesting I speak backwards and through a "rock or walkie talkie". Wants to talk about Jesus and his book. Pt given New Testament Bible per request. Pt resting with call bell within reach.  Will continue to monitor. Thomas Hoff, RN

## 2022-11-21 NOTE — Progress Notes (Signed)
Pt assisted back to bed from chair by NT Misty Stanley and NT Autumn. Per NT pt one person assist and did well getting back into bed. Pt resting with call bell within reach.  Will continue to monitor.

## 2022-11-21 NOTE — Progress Notes (Signed)
Physical Therapy Treatment Patient Details Name: Dustin [name] MRN: [id number] DOB: 1956/01/24 Today's Date: 11/21/2022   History of Present Illness Dustin [name] is a 67 y.o. male who presented to the hospital secondary to unwitnessed mechanical fall and generalized weakness. Workup demonstrated the presence of hypothermia, bradycardia and leukopenia; urinalysis suggesting the presence of UTI.  Patient met criteria for sepsis.  Due to underlying history of schizophrenia patient history noncontributory. Patient reported no chest pain, no nausea, no vomiting, no abdominal pain, no focal weaknesses.  He expressed pain in his right shoulder and right hip area. Past medical history: schizophrenia, colon cancer, tobacco abuse and anemia.    PT Comments    Pt received in supine, oriented to self and with disorganized/delirious speech and tangential comments throughout session. Pt able to be redirected to task with increased time and with fair participation, emphasis on seated/standing posture and transfer training. Pt needing up to modA for bed mobility with use of elevated HOB and rails and up to +2 maxA for step pivot transfer from bed>chair, pt with crouched posture throughout and bil knee buckling as he fatigued. Pt would benefit from Stedy vs hoyer with +2 for safe return transfer from chair>bed. Pt appeared to have difficulty activating triceps during bed mobility and standing posture in RW. Pt continues to benefit from PT services to progress toward functional mobility goals. Pt may also benefit from OT consult given deconditioning and cognitive deficit.  Recommendations for follow up therapy are one component of a multi-disciplinary discharge planning process, led by the attending physician.  Recommendations may be updated based on patient status, additional functional criteria and insurance authorization.  Follow Up Recommendations  Can patient physically be transported by private vehicle:  No    Assistance Recommended at Discharge Intermittent Supervision/Assistance  Patient can return home with the following A lot of help with bathing/dressing/bathroom;Help with stairs or ramp for entrance;Assistance with cooking/housework;Two people to help with walking and/or transfers;Direct supervision/assist for medications management;Direct supervision/assist for financial management;Assist for transportation   Equipment Recommendations  Rolling walker (2 wheels);Other (comment) (TBD post-acute, pt currently at wheelchair level)    Recommendations for Other Services       Precautions / Restrictions Precautions Precautions: Fall Precaution Comments: Contact precs; hallucinations/delirium Restrictions Weight Bearing Restrictions: No     Mobility  Bed Mobility Overal bed mobility: Needs Assistance Bed Mobility: Rolling, Sidelying to Sit Rolling: Supervision Sidelying to sit: Mod assist       General bed mobility comments: pt using head/neck to push into [facility] prior to using UE to assist.    Transfers Overall transfer level: Needs assistance Equipment used: Rolling walker (2 wheels) Transfers: Sit to/from Stand, Bed to chair/wheelchair/BSC Sit to Stand: Mod assist, +2 physical assistance, From elevated surface   Step pivot transfers: Max assist, From elevated surface, +2 physical assistance       General transfer comment: Pt maintains crouched posture with trunk flexed forward and elbows flexed, trunk nearly parallel to floor. Pt cued to sit (needs physical assist to guide hips back toward EOB to prevent buckling/sitting toward floor. After a brief seated break, pt stands again and instead of sidesteps toward [facility] as cued, pt begins to take pivotal steps toward chair. +2 maxA for safety to guide toward chair and chair pulled up closer as pt BLE buckling to ensure pt hips over seat. Cues for safe UE placement. Pt does not respond well to hand over hand assist to guide safely to  arm rests/RW  handles.    Ambulation/Gait             Pre-gait activities: limited to a few slow labored unsteady side steps to chair with frequent buckling of knees and had to sit to avoid falling (crouched posture increasing as he pivoted)     Stairs             Wheelchair Mobility    Modified Rankin (Stroke Patients Only)       Balance Overall balance assessment: Needs assistance Sitting-balance support: Feet supported, Single extremity supported Sitting balance-Leahy Scale: Fair Sitting balance - Comments: seated at EOB   Standing balance support: Reliant on assistive device for balance, During functional activity, Bilateral upper extremity supported Standing balance-Leahy Scale: Poor Standing balance comment: using RW, flexed posture                            Cognition Arousal/Alertness: Awake/alert Behavior During Therapy: Flat affect, Impulsive Overall Cognitive Status: No family/caregiver present to determine baseline cognitive functioning                                 General Comments: Pt following some commands with increased time, pt appears more confused this session and often tangential, with disorganized low volume speech and frequent nonsensical statements. Pt asking PTA to write down some words from his arm band ('Risk', 'Laser band', etc) onto the front cover of the bible in his room. Pt not redirectable and refused to sit EOB until after this was done, then following most commands for getting EOB/standing. Upon standing, pt more impulsive and ignoring cues for posture, directional navigation (pt encouraged to step toward [facility] rather than chair but ignores cues and begins to turn toward chair) and not redirectable.        Exercises Other Exercises Other Exercises: supine BLE AROM: ankle pumps x10 reps ea Other Exercises: pt ignoring cues for other LE seated/supine exercises due to apparent confusion vs hallucination and  not redirectable    General Comments General comments (skin integrity, edema, etc.): HR 77 bpm sitting in chair post-exertion; pt denies dizziness or lightheadedness but when prompted about symptoms pt states "Let's talk about my body, not about my mind"      Pertinent Vitals/Pain Pain Assessment Pain Assessment: PAINAD Breathing: normal Negative Vocalization: occasional moan/groan, low speech, negative/disapproving quality Facial Expression: smiling or inexpressive Body Language: relaxed Consolability: no need to console PAINAD Score: 1 Pain Intervention(s): Monitored during session, Limited activity within patient's tolerance, Repositioned     PT Goals (current goals can now be found in the care plan section) Acute Rehab PT Goals Patient Stated Goal: return home PT Goal Formulation: With patient Time For Goal Achievement: 11/28/22 Progress towards PT goals: Progressing toward goals (slowly)    Frequency    Min 3X/week      PT Plan Current plan remains appropriate       AM-PAC PT "6 Clicks" Mobility   Outcome Measure  Help needed turning from your back to your side while in a flat bed without using bedrails?: A Little Help needed moving from lying on your back to sitting on the side of a flat bed without using bedrails?: A Lot Help needed moving to and from a bed to a chair (including a wheelchair)?: A Lot Help needed standing up from a chair using your arms (e.g., wheelchair or bedside chair)?: A  Lot Help needed to walk in hospital room?: Total Help needed climbing 3-5 steps with a railing? : Total 6 Click Score: 11    End of Session Equipment Utilized During Treatment: Gait belt Activity Tolerance: Patient tolerated treatment well;Patient limited by fatigue;Other (comment) (cognitive deficit limiting safe mobility progression/participation) Patient left: in chair;with call bell/phone within reach;with chair alarm set;Other (comment) (unit secretary asked to order  posey lap belt alarm for additional safety, chair pad alarm in place/activated) Nurse Communication: Mobility status;Need for lift equipment;Other (comment) (+2 Stedy would be safest for return transfer) PT Visit Diagnosis: Unsteadiness on feet (R26.81);Other abnormalities of gait and mobility (R26.89);Muscle weakness (generalized) (M62.81)     Time: 1030-1055 PT Time Calculation (min) (ACUTE ONLY): 25 min  Charges:  $Therapeutic Activity: 23-37 mins                     Ordell Prichett P., PTA Acute Rehabilitation Services Secure Chat Preferred 9a-5:30pm Office: [phone]    Dorathy Kinsman [facility] 11/21/2022, 11:23 AM

## 2022-11-21 NOTE — TOC Progression Note (Signed)
Transition of Care [facility]) - Progression Note    Patient Details  Name: Dustin [name] MRN: [id number] Date of Birth: 05-12-1956  Transition of Care [facility]) CM/SW Contact  Eduard Roux, [place] Phone Number: 11/21/2022, 12:57 PM  Clinical Narrative:    Family has accepted bed offer with [facility].   SNF has confirmed bed offer  Insurance pending -auth reference # [id number]  TOC will continue to follow and assist with discharge planning.  Antony Blackbird, MSW, LCSW Clinical Social Worker    Expected Discharge Plan: Skilled Nursing Facility Barriers to Discharge: Continued Medical Work up  Expected Discharge Plan and Services                                               Social Determinants of Health (SDOH) Interventions SDOH Screenings   Housing: High Risk (07/21/2022)  Alcohol Screen: Low Risk  (03/06/2022)  Depression (PHQ2-9): Low Risk  (03/06/2022)  Tobacco Use: High Risk (11/16/2022)    Readmission Risk Interventions     No data to display

## 2022-11-21 NOTE — Evaluation (Signed)
Occupational Therapy Evaluation Patient Details Name: Dustin [name] MRN: [id number] DOB: [birth year] Today's Date: 11/21/2022   History of Present Illness Pt is a 67 y/o M presenting to ED on 6/7 after mechanical fall, presenting with generalized weakness, found to be hypothermic and bradycardic. Admitted for sepsis 2/2 UTI. PMH significant for schizophrenia.   Clinical Impression   Pt reports using cane at baseline for mobility, and is ind with ADLs but reports they are "difficult". Pt needing min-max A for ADLs, mod A for bed mobility, and mod A +2 for transfers at time of evaluation. Pt needing frequent cues/redirection to task during session, following commands with increased time. Pt presenting with impairments listed below, will follow acutely. Patient will benefit from continued inpatient follow up therapy, <3 hours/day to maximize safety/ind with ADLs/functional mobility.       Recommendations for follow up therapy are one component of a multi-disciplinary discharge planning process, led by the attending physician.  Recommendations may be updated based on patient status, additional functional criteria and insurance authorization.   Assistance Recommended at Discharge Frequent or constant Supervision/Assistance  Patient can return home with the following Two people to help with walking and/or transfers;A lot of help with bathing/dressing/bathroom;Assistance with cooking/housework;Direct supervision/assist for medications management;Direct supervision/assist for financial management;Assist for transportation;Help with stairs or ramp for entrance    Functional Status Assessment  Patient has had a recent decline in their functional status and demonstrates the ability to make significant improvements in function in a reasonable and predictable amount of time.  Equipment Recommendations  Other (comment) (defer)    Recommendations for Other Services PT consult     Precautions /  Restrictions Precautions Precautions: Fall Precaution Comments: Contact precs; hallucinations/delirium Restrictions Weight Bearing Restrictions: No      Mobility Bed Mobility Overal bed mobility: Needs Assistance Bed Mobility: Rolling, Sidelying to Sit Rolling: Supervision Sidelying to sit: Mod assist       General bed mobility comments: use of bed pad to scoot hips to EOB    Transfers Overall transfer level: Needs assistance Equipment used: Rolling walker (2 wheels) Transfers: Sit to/from Stand, Bed to chair/wheelchair/BSC Sit to Stand: Mod assist, +2 physical assistance     Step pivot transfers: Mod assist, +2 physical assistance     General transfer comment: keeps trunk flexed during transfer, difficulty sequencing stepping pattern to chair      Balance Overall balance assessment: Needs assistance Sitting-balance support: Feet supported, Single extremity supported Sitting balance-Leahy Scale: Fair Sitting balance - Comments: seated at EOB   Standing balance support: Reliant on assistive device for balance, During functional activity, Bilateral upper extremity supported Standing balance-Leahy Scale: Poor Standing balance comment: using RW, flexed posture                           ADL either performed or assessed with clinical judgement   ADL Overall ADL's : Needs assistance/impaired Eating/Feeding: Sitting;Minimal assistance   Grooming: Minimal assistance;Sitting   Upper Body Bathing: Moderate assistance   Lower Body Bathing: Maximal assistance   Upper Body Dressing : Moderate assistance   Lower Body Dressing: Maximal assistance   Toilet Transfer: Moderate assistance;+2 for physical assistance   Toileting- Clothing Manipulation and Hygiene: Moderate assistance       Functional mobility during ADLs: Moderate assistance;+2 for physical assistance       Vision   Vision Assessment?: No apparent visual deficits Additional Comments:  functional for BADL  Perception Perception Perception Tested?: No   Praxis Praxis Praxis tested?: Not tested    Pertinent Vitals/Pain Pain Assessment Pain Assessment: No/denies pain     Hand Dominance Right   Extremity/Trunk Assessment Upper Extremity Assessment Upper Extremity Assessment: Generalized weakness;LUE deficits/detail LUE Deficits / Details: limited shoulder AROM; able to reach up to grasp RW while seated EOB LUE Coordination: decreased gross motor       Cervical / Trunk Assessment Cervical / Trunk Assessment: Normal   Communication Communication Communication: HOH   Cognition Arousal/Alertness: Awake/alert Behavior During Therapy: Flat affect, Impulsive Overall Cognitive Status: No family/caregiver present to determine baseline cognitive functioning                                 General Comments: follow commands with increased time, at times nonsensical speech     General Comments  VSS on RA    Exercises     Shoulder Instructions      Home Living Family/patient expects to be discharged to:: Group home                                        Prior Functioning/Environment               Mobility Comments: Household ambulator using [facility] ADLs Comments: reports ind with ADLs but it is "difficult"        OT Problem List: Decreased strength;Decreased range of motion;Decreased activity tolerance;Impaired balance (sitting and/or standing)      OT Treatment/Interventions: Self-care/ADL training;Therapeutic exercise    OT Goals(Current goals can be found in the care plan section) Acute Rehab OT Goals Patient Stated Goal: none stated OT Goal Formulation: With patient Time For Goal Achievement: 12/05/22 Potential to Achieve Goals: Good ADL Goals Pt Will Perform Upper Body Dressing: with min assist Pt Will Perform Lower Body Dressing: with min assist;sit to/from stand;sitting/lateral leans Pt Will Transfer to  Toilet: with min assist;squat pivot transfer;stand pivot transfer;bedside commode Additional ADL Goal #1: pt will perform bed mobility with supervision in prep for ADLs  OT Frequency: Min 1X/week    Co-evaluation              AM-PAC OT "6 Clicks" Daily Activity     Outcome Measure Help from another person eating meals?: A Little Help from another person taking care of personal grooming?: A Little Help from another person toileting, which includes using toliet, bedpan, or urinal?: A Lot Help from another person bathing (including washing, rinsing, drying)?: A Lot Help from another person to put on and taking off regular upper body clothing?: A Lot Help from another person to put on and taking off regular lower body clothing?: A Lot 6 Click Score: 14   End of Session Equipment Utilized During Treatment: Gait belt;Rolling walker (2 wheels) Nurse Communication: Mobility status  Activity Tolerance: Patient tolerated treatment well Patient left: in chair;with call bell/phone within reach;with chair alarm set  OT Visit Diagnosis: Unsteadiness on feet (R26.81);Other abnormalities of gait and mobility (R26.89);Muscle weakness (generalized) (M62.81)                Time: 1610-9604 OT Time Calculation (min): 19 min Charges:  OT General Charges $OT Visit: 1 Visit OT Evaluation $OT Eval Moderate Complexity: 1 Mod  Americus Scheurich K, OTD, OTR/L SecureChat Preferred Acute Rehab [phone]  Dalphine Handing 11/21/2022, 4:19 PM

## 2022-11-21 NOTE — Progress Notes (Signed)
PROGRESS NOTE  Dustin [name]  WUJ:[id number] DOB: [birth year] DOA: 11/10/2022 PCP: Sallyanne Kuster, NP   Brief Narrative: Patient is a 67 year old male with history of schizophrenia, colon cancer, diabetes, anemia who presented to [facility] initially after mechanical fall, generalized weakness.  On presentation, found to be hypothermic, bradycardic.  He was history of UTI.  Patient was admitted for the suspicion of sepsis.  Hospital course remarkable for persistent bradycardia/hypothermia, cardiology was consulted.  Psychiatry was also following for the management of schizophrenia.  Patient was transferred to [facility] for further cardiology workup regarding bradycardia.  But apparently his bradycardia has improved and cardiology signed off saying no indication of pacemaker.  PT/OT recommending SNF on discharge.  Medically stable for discharge whenever possible  Assessment & Plan:  Principal Problem:   Sepsis secondary to UTI [facility]) Active Problems:   Schizoaffective disorder, bipolar type (HCC)   Tobacco use disorder   Malignant neoplasm of ascending colon (HCC)   Pressure injury of skin   Bacteremia  Sepsis secondary to UTI/bacteremia: Presented with leukopenia, hypothermia, weakness.  Urine culture showed E. coli.  Culture showed Staph epidermidis on 1 set, likely contaminant.  Repeat cultures have been negative.    Echo negative for endocarditis.  ID was following. He completed abx course  Hypothermia/Bradycardia: Bradycardia thought to be related hypothermia, risperidone.  TSH normal.  Patient transferred to [facility] for EP evaluation but apparently his bradycardia has improved and currently signed off saying no need of pacemaker.  Avoid nodal blocking agents. He is intermittently hypothermic, etiology not clear.  Sepsis has been already treated with antibiotics.  Schizoaffective disorder/bipolar disorder: Psychiatry was following.  Risperidone held.  Psychiatry  recommending to continue Invega.  He needs to follow-up with psychiatry as an outpatient.  He  remains confused  Tobacco use: Continue nicotine patch.  Counseled cessation  Colon cancer: Will recommend to follow-up with oncology as soon as possible after discharge  Hypotension: Started on midodrine.  Thrombocytopenia: Stable, continue to monitor  Debility/deconditioning: PT/OT consulted, recommended SNF on discharge.  Social worker consulted and following  Pressure Injury 11/10/22 Hip Left Stage 2 -  Partial thickness loss of dermis presenting as a shallow open injury with a red, pink wound bed without slough. (Active)  11/10/22 1535  Location: Hip  Location Orientation: Left  Staging: Stage 2 -  Partial thickness loss of dermis presenting as a shallow open injury with a red, pink wound bed without slough.  Wound Description (Comments):   Present on Admission: Yes     Pressure Injury 11/16/22 Coccyx Medial Stage 2 -  Partial thickness loss of dermis presenting as a shallow open injury with a red, pink wound bed without slough. Abbrassion from what appears to be a poped blister (Active)  11/16/22 2030  Location: Coccyx  Location Orientation: Medial  Staging: Stage 2 -  Partial thickness loss of dermis presenting as a shallow open injury with a red, pink wound bed without slough.  Wound Description (Comments): Abbrassion from what appears to be a poped blister  Present on Admission: No         Pressure Injury 11/10/22 Hip Left Stage 2 -  Partial thickness loss of dermis presenting as a shallow open injury with a red, pink wound bed without slough. (Active)  11/10/22 1535  Location: Hip  Location Orientation: Left  Staging: Stage 2 -  Partial thickness loss of dermis presenting as a shallow open injury with a red, pink wound bed without  slough.  Wound Description (Comments):   Present on Admission: Yes  Dressing Type Foam - Lift dressing to assess site every shift 11/21/22 0800      Pressure Injury 11/16/22 Coccyx Medial Stage 2 -  Partial thickness loss of dermis presenting as a shallow open injury with a red, pink wound bed without slough. Abbrassion from what appears to be a poped blister (Active)  11/16/22 2030  Location: Coccyx  Location Orientation: Medial  Staging: Stage 2 -  Partial thickness loss of dermis presenting as a shallow open injury with a red, pink wound bed without slough.  Wound Description (Comments): Abbrassion from what appears to be a poped blister  Present on Admission: No  Dressing Type Foam - Lift dressing to assess site every shift 11/20/22 2000    DVT prophylaxis:SCDs Start: 11/10/22 1433     Code Status: Full Code  Family Communication: Called brother and discussed  on phone on 6/17  Patient status:Inpatient  Patient is from :Home  Anticipated discharge to:SNF  Estimated DC date:whenever possible  Consultants: Cardiology,psychiatry  Procedures:None  Antimicrobials:  Anti-infectives (From admission, onward)    Start     Dose/Rate Route Frequency Ordered Stop   11/17/22 1100  cefTRIAXone (ROCEPHIN) 2 g in sodium chloride 0.9 % 100 mL IVPB        2 g 200 mL/hr over 30 Minutes Intravenous Every 24 hours 11/17/22 1004 11/19/22 1100   11/17/22 1000  cefadroxil (DURICEF) capsule 500 mg  Status:  Discontinued        500 mg Oral 2 times daily 11/16/22 1123 11/17/22 1004   11/12/22 1400  vancomycin (VANCOREADY) IVPB 1250 mg/250 mL  Status:  Discontinued        1,250 mg 166.7 mL/hr over 90 Minutes Intravenous Every 24 hours 11/11/22 1328 11/14/22 1623   11/11/22 1500  vancomycin (VANCOREADY) IVPB 1500 mg/300 mL        1,500 mg 150 mL/hr over 120 Minutes Intravenous  Once 11/11/22 1328 11/11/22 1646   11/11/22 1000  cefTRIAXone (ROCEPHIN) 2 g in sodium chloride 0.9 % 100 mL IVPB  Status:  Discontinued        2 g 200 mL/hr over 30 Minutes Intravenous Every 24 hours 11/10/22 1427 11/16/22 1123   11/10/22 1215  cefTRIAXone  (ROCEPHIN) 1 g in sodium chloride 0.9 % 100 mL IVPB        1 g 200 mL/hr over 30 Minutes Intravenous  Once 11/10/22 1208 11/10/22 1310       Subjective: Patient seen and examined the bedside today.  He looks calm and comfortable this morning.  He was eating his breakfast.  He remains confused but not agitated like yesterday.  Heart rate is stable  Objective: Vitals:   11/20/22 2036 11/20/22 2335 11/21/22 0300 11/21/22 0751  BP: (!) 84/68 118/66 115/66 112/80  Pulse: 70 66 66 67  Resp: 13 12 18 14   Temp: (!) 97.3 F (36.3 C) 97.7 F (36.5 C) 97.6 F (36.4 C) (!) 97.5 F (36.4 C)  TempSrc: Axillary Oral Oral Oral  SpO2: 100% 100% 100% 100%  Weight:      Height:        Intake/Output Summary (Last 24 hours) at 11/21/2022 1134 Last data filed at 11/21/2022 0800 Gross per 24 hour  Intake 240 ml  Output 715 ml  Net -475 ml   Filed Weights   11/14/22 0500 11/16/22 0523 11/17/22 0405  Weight: 68.6 kg 67 kg 66 kg  Examination:   General exam: Overall comfortable, not in distress, deconditioned, lying in bed HEENT: PERRL Respiratory system:  no wheezes or crackles  Cardiovascular system: S1 & S2 heard, RRR.  Gastrointestinal system: Abdomen is nondistended, soft and nontender. Central nervous system: Alert awake but confused Extremities: No edema, no clubbing ,no cyanosis Skin: No rashes, no ulcers,no icterus    Data Reviewed: I have personally reviewed following labs and imaging studies  CBC: Recent Labs  Lab 11/15/22 0506 11/16/22 0522 11/18/22 0106  WBC 3.7* 4.0 4.3  HGB 11.5* 11.8* 12.4*  HCT 37.0* 37.5* 38.7*  MCV 84.9 84.1 83.4  PLT 70* 94* 110*   Basic Metabolic Panel: Recent Labs  Lab 11/15/22 0506 11/16/22 0522 11/17/22 0533 11/18/22 0106  NA 140 140  --  137  K 3.2* 4.0  --  4.1  CL 110 111  --  102  CO2 23 25  --  25  GLUCOSE 105* 74  --  95  BUN 15 15  --  12  CREATININE 1.13 1.07 0.99 0.94  CALCIUM 8.8* 8.9  --  9.1  MG 1.8 2.2  --   1.9  PHOS 2.8  --   --   --      Recent Results (from the past 240 hour(s))  Culture, blood (Routine X 2) w Reflex to ID Panel     Status: None   Collection Time: 11/13/22  8:59 AM   Specimen: BLOOD LEFT HAND  Result Value Ref Range Status   Specimen Description BLOOD LEFT HAND  Final   Special Requests   Final    BOTTLES DRAWN AEROBIC AND ANAEROBIC Blood Culture adequate volume   Culture   Final    NO GROWTH 5 DAYS Performed at [facility], [address] [place], [place] [address]    Report Status 11/18/2022 FINAL  Final  Culture, blood (Routine X 2) w Reflex to ID Panel     Status: None   Collection Time: 11/13/22  9:15 AM   Specimen: BLOOD RIGHT HAND  Result Value Ref Range Status   Specimen Description BLOOD RIGHT HAND  Final   Special Requests   Final    BOTTLES DRAWN AEROBIC ONLY Blood Culture adequate volume   Culture   Final    NO GROWTH 5 DAYS Performed at [facility], [address], [place], [place] [address]    Report Status 11/18/2022 FINAL  Final     Radiology Studies: No results found.  Scheduled Meds:  Chlorhexidine Gluconate Cloth  6 each Topical Daily   midodrine  2.5 mg Oral TID WC   nicotine  14 mg Transdermal Daily   [START ON 11/22/2022] paliperidone  234 mg Intramuscular Q28 days   paliperidone  3 mg Oral Daily   Continuous Infusions:  sodium chloride Stopped (11/19/22 1857)     LOS: 11 days   Burnadette Pop, MD [facility] P6/18/2024, 11:34 AM

## 2022-11-22 DIAGNOSIS — A419 Sepsis, unspecified organism: Secondary | ICD-10-CM | POA: Diagnosis not present

## 2022-11-22 DIAGNOSIS — N390 Urinary tract infection, site not specified: Secondary | ICD-10-CM | POA: Diagnosis not present

## 2022-11-22 LAB — GLUCOSE, CAPILLARY
Glucose-Capillary: 100 mg/dL — ABNORMAL HIGH (ref 70–99)
Glucose-Capillary: 104 mg/dL — ABNORMAL HIGH (ref 70–99)

## 2022-11-22 NOTE — Progress Notes (Signed)
Nurse Notified Dr. Delma Post that pt refused labs this morning. Pt educated, still refused.

## 2022-11-22 NOTE — TOC Progression Note (Signed)
Transition of Care [facility]) - Progression Note    Patient Details  Name: Dustin [name] MRN: [id number] Date of Birth: Feb 07, 1956  Transition of [facility]) CM/SW Contact  Eduard Roux, [place] Phone Number: 11/22/2022, 8:58 AM  Clinical Narrative:     SNF authorization still pending   Expected Discharge Plan: Skilled Nursing Facility Barriers to Discharge: Continued Medical Work up  Expected Discharge Plan and Services                                               Social Determinants of Health (SDOH) Interventions SDOH Screenings   Housing: High Risk (07/21/2022)  Alcohol Screen: Low Risk  (03/06/2022)  Depression (PHQ2-9): Low Risk  (03/06/2022)  Tobacco Use: High Risk (11/16/2022)    Readmission Risk Interventions     No data to display

## 2022-11-22 NOTE — Progress Notes (Signed)
PROGRESS NOTE  Dustin [name] [place]  ZOX:[id number] DOB: Aug 30, 1955 DOA: 11/10/2022 PCP: Sallyanne Kuster, NP   Brief Narrative: Patient is a 67 year old male with history of schizophrenia, colon cancer, diabetes, anemia who presented to [facility] initially after mechanical fall, generalized weakness.  On presentation, found to be hypothermic, bradycardic.  He was history of UTI.  Patient was admitted for the suspicion of sepsis.  Hospital course remarkable for persistent bradycardia/hypothermia, cardiology was consulted.  Psychiatry was also following for the management of schizophrenia.  Patient was transferred to [facility] for further cardiology workup regarding bradycardia.  But apparently his bradycardia has improved and cardiology signed off saying no indication of pacemaker.  PT/OT recommending SNF on discharge.  Medically stable for discharge whenever possible  Assessment & Plan:  Principal Problem:   Sepsis secondary to UTI [facility]) Active Problems:   Schizoaffective disorder, bipolar type (HCC)   Tobacco use disorder   Malignant neoplasm of ascending colon (HCC)   Pressure injury of skin   Bacteremia  Sepsis secondary to UTI/bacteremia: Presented with leukopenia, hypothermia, weakness.  Urine culture showed E. coli.  Culture showed Staph epidermidis on 1 set, likely contaminant.  Repeat cultures have been negative.    Echo negative for endocarditis.  ID was following. He completed abx course  Hypothermia/Bradycardia: Bradycardia thought to be related hypothermia, risperidone.  TSH normal.  Patient transferred to [facility] for EP evaluation but apparently his bradycardia has improved and currently signed off saying no need of pacemaker.  Avoid nodal blocking agents. He is intermittently hypothermic, etiology not clear.  Sepsis has been already treated with antibiotics.  Schizoaffective disorder/bipolar disorder: Psychiatry was following.  Risperidone held.  Psychiatry  recommending to continue Invega.  He needs to follow-up with psychiatry as an outpatient.  He  remains confused  Tobacco use: Continue nicotine patch.  Counseled cessation  Colon cancer: Will recommend to follow-up with oncology as soon as possible after discharge  Hypotension: Started on midodrine.  Thrombocytopenia: Stable, continue to monitor  Debility/deconditioning: PT/OT consulted, recommended SNF on discharge.  Social worker consulted and following  Pressure Injury 11/10/22 Hip Left Stage 2 -  Partial thickness loss of dermis presenting as a shallow open injury with a red, pink wound bed without slough. (Active)  11/10/22 1535  Location: Hip  Location Orientation: Left  Staging: Stage 2 -  Partial thickness loss of dermis presenting as a shallow open injury with a red, pink wound bed without slough.  Wound Description (Comments):   Present on Admission: Yes     Pressure Injury 11/16/22 Coccyx Medial Stage 2 -  Partial thickness loss of dermis presenting as a shallow open injury with a red, pink wound bed without slough. Abbrassion from what appears to be a poped blister (Active)  11/16/22 2030  Location: Coccyx  Location Orientation: Medial  Staging: Stage 2 -  Partial thickness loss of dermis presenting as a shallow open injury with a red, pink wound bed without slough.  Wound Description (Comments): Abbrassion from what appears to be a poped blister  Present on Admission: No         Pressure Injury 11/10/22 Hip Left Stage 2 -  Partial thickness loss of dermis presenting as a shallow open injury with a red, pink wound bed without slough. (Active)  11/10/22 1535  Location: Hip  Location Orientation: Left  Staging: Stage 2 -  Partial thickness loss of dermis presenting as a shallow open injury with a red, pink wound bed without  slough.  Wound Description (Comments):   Present on Admission: Yes  Dressing Type Foam - Lift dressing to assess site every shift 11/21/22 2000      Pressure Injury 11/16/22 Coccyx Medial Stage 2 -  Partial thickness loss of dermis presenting as a shallow open injury with a red, pink wound bed without slough. Abbrassion from what appears to be a poped blister (Active)  11/16/22 2030  Location: Coccyx  Location Orientation: Medial  Staging: Stage 2 -  Partial thickness loss of dermis presenting as a shallow open injury with a red, pink wound bed without slough.  Wound Description (Comments): Abbrassion from what appears to be a poped blister  Present on Admission: No  Dressing Type Foam - Lift dressing to assess site every shift 11/21/22 2000    DVT prophylaxis:SCDs Start: 11/10/22 1433     Code Status: Full Code  Family Communication: Called brother and discussed  on phone on 6/17  Patient status:Inpatient  Patient is from :Home  Anticipated discharge to:SNF  Estimated DC date:whenever possible,auth pending  Consultants: Cardiology,psychiatry  Procedures:None  Antimicrobials:  Anti-infectives (From admission, onward)    Start     Dose/Rate Route Frequency Ordered Stop   11/17/22 1100  cefTRIAXone (ROCEPHIN) 2 g in sodium chloride 0.9 % 100 mL IVPB        2 g 200 mL/hr over 30 Minutes Intravenous Every 24 hours 11/17/22 1004 11/19/22 1100   11/17/22 1000  cefadroxil (DURICEF) capsule 500 mg  Status:  Discontinued        500 mg Oral 2 times daily 11/16/22 1123 11/17/22 1004   11/12/22 1400  vancomycin (VANCOREADY) IVPB 1250 mg/250 mL  Status:  Discontinued        1,250 mg 166.7 mL/hr over 90 Minutes Intravenous Every 24 hours 11/11/22 1328 11/14/22 1623   11/11/22 1500  vancomycin (VANCOREADY) IVPB 1500 mg/300 mL        1,500 mg 150 mL/hr over 120 Minutes Intravenous  Once 11/11/22 1328 11/11/22 1646   11/11/22 1000  cefTRIAXone (ROCEPHIN) 2 g in sodium chloride 0.9 % 100 mL IVPB  Status:  Discontinued        2 g 200 mL/hr over 30 Minutes Intravenous Every 24 hours 11/10/22 1427 11/16/22 1123   11/10/22 1215   cefTRIAXone (ROCEPHIN) 1 g in sodium chloride 0.9 % 100 mL IVPB        1 g 200 mL/hr over 30 Minutes Intravenous  Once 11/10/22 1208 11/10/22 1310       Subjective: Patient seen and examined at bedside today.  Hemodynamically stable.  Lying in bed.  Heart rate stable.  Not hypothermic.  Remains confused.  He was sleeping, I woke him up, he was not happy Objective: Vitals:   11/22/22 0013 11/22/22 0422 11/22/22 0700 11/22/22 1300  BP: 114/68 119/69 108/71 119/75  Pulse: 66 (!) 53 64 60  Resp: 11 11 13 11   Temp: 97.6 F (36.4 C) (!) 97.4 F (36.3 C) (!) 97.3 F (36.3 C) (!) 97.3 F (36.3 C)  TempSrc: Axillary Oral  Oral  SpO2: 100% 100%  100%  Weight:      Height:        Intake/Output Summary (Last 24 hours) at 11/22/2022 1349 Last data filed at 11/21/2022 1700 Gross per 24 hour  Intake --  Output 400 ml  Net -400 ml   Filed Weights   11/14/22 0500 11/16/22 0523 11/17/22 0405  Weight: 68.6 kg 67 kg 66 kg  Examination:   General exam: Overall comfortable, not in distress, lying in bed, not in distress, confused HEENT: PERRL Respiratory system:  no wheezes or crackles  Cardiovascular system: S1 & S2 heard, RRR.  Gastrointestinal system: Abdomen is nondistended, soft and nontender. Central nervous system: Alert and awake but confused  Extremities: No edema, no clubbing ,no cyanosis Skin: No rashes, no ulcers,no icterus    Data Reviewed: I have personally reviewed following labs and imaging studies  CBC: Recent Labs  Lab 11/16/22 0522 11/18/22 0106  WBC 4.0 4.3  HGB 11.8* 12.4*  HCT 37.5* 38.7*  MCV 84.1 83.4  PLT 94* 110*   Basic Metabolic Panel: Recent Labs  Lab 11/16/22 0522 11/17/22 0533 11/18/22 0106  NA 140  --  137  K 4.0  --  4.1  CL 111  --  102  CO2 25  --  25  GLUCOSE 74  --  95  BUN 15  --  12  CREATININE 1.07 0.99 0.94  CALCIUM 8.9  --  9.1  MG 2.2  --  1.9     Recent Results (from the past 240 hour(s))  Culture, blood (Routine  X 2) w Reflex to ID Panel     Status: None   Collection Time: 11/13/22  8:59 AM   Specimen: BLOOD LEFT HAND  Result Value Ref Range Status   Specimen Description BLOOD LEFT HAND  Final   Special Requests   Final    BOTTLES DRAWN AEROBIC AND ANAEROBIC Blood Culture adequate volume   Culture   Final    NO GROWTH 5 DAYS Performed at [facility], [address], [place], [place] [address]    Report Status 11/18/2022 FINAL  Final  Culture, blood (Routine X 2) w Reflex to ID Panel     Status: None   Collection Time: 11/13/22  9:15 AM   Specimen: BLOOD RIGHT HAND  Result Value Ref Range Status   Specimen Description BLOOD RIGHT HAND  Final   Special Requests   Final    BOTTLES DRAWN AEROBIC ONLY Blood Culture adequate volume   Culture   Final    NO GROWTH 5 DAYS Performed at [facility], [address], [place], [place] [address]    Report Status 11/18/2022 FINAL  Final     Radiology Studies: No results found.  Scheduled Meds:  Chlorhexidine Gluconate Cloth  6 each Topical Daily   midodrine  2.5 mg Oral TID WC   nicotine  14 mg Transdermal Daily   paliperidone  234 mg Intramuscular Q28 days   paliperidone  3 mg Oral Daily   Continuous Infusions:  sodium chloride Stopped (11/19/22 1857)     LOS: 12 days   Burnadette Pop, MD [facility] P6/19/2024, 1:49 PM

## 2022-11-23 DIAGNOSIS — N390 Urinary tract infection, site not specified: Secondary | ICD-10-CM | POA: Diagnosis not present

## 2022-11-23 DIAGNOSIS — C189 Malignant neoplasm of colon, unspecified: Secondary | ICD-10-CM | POA: Diagnosis not present

## 2022-11-23 DIAGNOSIS — R001 Bradycardia, unspecified: Secondary | ICD-10-CM | POA: Diagnosis not present

## 2022-11-23 DIAGNOSIS — R2681 Unsteadiness on feet: Secondary | ICD-10-CM | POA: Diagnosis not present

## 2022-11-23 DIAGNOSIS — M6281 Muscle weakness (generalized): Secondary | ICD-10-CM | POA: Diagnosis not present

## 2022-11-23 DIAGNOSIS — R7881 Bacteremia: Secondary | ICD-10-CM | POA: Diagnosis not present

## 2022-11-23 DIAGNOSIS — I959 Hypotension, unspecified: Secondary | ICD-10-CM | POA: Diagnosis not present

## 2022-11-23 DIAGNOSIS — D649 Anemia, unspecified: Secondary | ICD-10-CM | POA: Diagnosis not present

## 2022-11-23 DIAGNOSIS — A419 Sepsis, unspecified organism: Secondary | ICD-10-CM | POA: Diagnosis not present

## 2022-11-23 DIAGNOSIS — R262 Difficulty in walking, not elsewhere classified: Secondary | ICD-10-CM | POA: Diagnosis not present

## 2022-11-23 DIAGNOSIS — Z743 Need for continuous supervision: Secondary | ICD-10-CM | POA: Diagnosis not present

## 2022-11-23 DIAGNOSIS — R269 Unspecified abnormalities of gait and mobility: Secondary | ICD-10-CM | POA: Diagnosis not present

## 2022-11-23 DIAGNOSIS — I499 Cardiac arrhythmia, unspecified: Secondary | ICD-10-CM | POA: Diagnosis not present

## 2022-11-23 DIAGNOSIS — Z720 Tobacco use: Secondary | ICD-10-CM | POA: Diagnosis not present

## 2022-11-23 DIAGNOSIS — D638 Anemia in other chronic diseases classified elsewhere: Secondary | ICD-10-CM | POA: Diagnosis not present

## 2022-11-23 DIAGNOSIS — D696 Thrombocytopenia, unspecified: Secondary | ICD-10-CM | POA: Diagnosis not present

## 2022-11-23 DIAGNOSIS — Z7401 Bed confinement status: Secondary | ICD-10-CM | POA: Diagnosis not present

## 2022-11-23 DIAGNOSIS — E119 Type 2 diabetes mellitus without complications: Secondary | ICD-10-CM | POA: Diagnosis not present

## 2022-11-23 DIAGNOSIS — Z9181 History of falling: Secondary | ICD-10-CM | POA: Diagnosis not present

## 2022-11-23 LAB — GLUCOSE, CAPILLARY
Glucose-Capillary: 108 mg/dL — ABNORMAL HIGH (ref 70–99)
Glucose-Capillary: 76 mg/dL (ref 70–99)
Glucose-Capillary: 169 mg/dL — ABNORMAL HIGH (ref 70–99)

## 2022-11-23 MED ORDER — PALIPERIDONE ER 3 MG PO TB24: 3.0000 mg | ORAL_TABLET | Freq: Every day | ORAL | 0 refills | Status: DC

## 2022-11-23 MED ORDER — INVEGA SUSTENNA 234 MG/1.5ML IM SUSY: 234.0000 mg | PREFILLED_SYRINGE | INTRAMUSCULAR | Status: DC

## 2022-11-23 MED ORDER — MIDODRINE HCL 2.5 MG PO TABS: 2.5000 mg | ORAL_TABLET | Freq: Three times a day (TID) | ORAL | Status: DC

## 2022-11-23 MED ORDER — NICOTINE 14 MG/24HR TD PT24: 14.0000 mg | MEDICATED_PATCH | Freq: Every day | TRANSDERMAL | 0 refills | Status: DC

## 2022-11-23 MED ORDER — PALIPERIDONE PALMITATE ER 156 MG/ML IM SUSY: 156.0000 mg | PREFILLED_SYRINGE | Freq: Once | INTRAMUSCULAR | 0 refills | Status: DC

## 2022-11-23 NOTE — TOC Transition Note (Signed)
Transition of Care [facility]) - CM/SW Discharge Note   Patient Details  Name: Dustin [name] MRN: [id number] Date of Birth: 10/25/1955  [facility] CM/SW Contact:  Eduard Roux, LCSW Phone Number: 11/23/2022, 1:21 PM   Clinical Narrative:     Patient will Discharge to: [facility] Discharge Date: 11/23/2022 Family Notified: LVM w/brother- he completed  paper at the SNF today for d/c. Transport By: Sharin Mons  Per MD patient is ready for discharge. RN, patient, and facility notified of discharge. Discharge Summary sent to facility. RN given number for [phone], Room 201. Ambulance transport requested for patient.   Clinical Social Worker signing off.  Antony Blackbird, MSW, LCSW Clinical Social Worker       Barriers to Discharge: Barriers Resolved   Patient Goals and CMS Choice      Discharge Placement                         Discharge Plan and Services Additional resources added to the After Visit Summary for                                       Social Determinants of Health (SDOH) Interventions SDOH Screenings   Housing: High Risk (07/21/2022)  Alcohol Screen: Low Risk  (03/06/2022)  Depression (PHQ2-9): Low Risk  (03/06/2022)  Tobacco Use: High Risk (11/16/2022)     Readmission Risk Interventions     No data to display

## 2022-11-23 NOTE — Progress Notes (Signed)
Report given to April at [facility], all questions answered at this time. Amiyah Shryock, Randall An RN

## 2022-11-23 NOTE — Care Management Important Message (Signed)
Important Message  Patient Details  Name: Dustin [name] MRN: [id number] Date of Birth: 1956/02/05   Medicare Important Message Given:  Yes     Renie Ora 11/23/2022, 8:49 AM

## 2022-11-23 NOTE — TOC Progression Note (Signed)
Transition of Care [facility]) - Progression Note    Patient Details  Name: Dustin [name] MRN: [id number] Date of Birth: 1956/05/08  Transition of Care [facility]) CM/SW Contact  Eduard Roux, [place] Phone Number: 11/23/2022, 8:58 AM  Clinical Narrative:     Insurance authorization approved [id number]  Reference # [id number] 6/18-6/20.   Expected Discharge Plan: Skilled Nursing Facility Barriers to Discharge: Barriers Resolved  Expected Discharge Plan and Services                                               Social Determinants of Health (SDOH) Interventions SDOH Screenings   Housing: High Risk (07/21/2022)  Alcohol Screen: Low Risk  (03/06/2022)  Depression (PHQ2-9): Low Risk  (03/06/2022)  Tobacco Use: High Risk (11/16/2022)    Readmission Risk Interventions     No data to display

## 2022-11-23 NOTE — Discharge Summary (Addendum)
Physician Discharge Summary  Dustin [name] YQM:[id number] DOB: 11/03/1955 DOA: 11/10/2022  PCP: Dustin Kuster, NP  Admit date: 11/10/2022 Discharge date: 11/23/2022  Admitted From: Home Disposition:  Home  Discharge Condition:Stable CODE STATUS:FULL Diet recommendation: Regular   Brief/Interim Summary: Patient is a 67 year old male with history of schizophrenia, colon cancer, diabetes, anemia who presented to [facility] initially after mechanical fall, generalized weakness.  On presentation, found to be hypothermic, bradycardic.  He was history of UTI.  Patient was admitted for the suspicion of sepsis.  Hospital course remarkable for persistent bradycardia/hypothermia, cardiology was consulted.  Psychiatry was also following for the management of schizophrenia.  Patient was transferred to [facility] for further cardiology workup regarding bradycardia.  But apparently his bradycardia has improved and cardiology signed off saying no indication of pacemaker.  PT/OT recommending SNF on discharge.  Medically stable for discharge whenever possible   Following problems were addressed during the hospitalization:  Sepsis secondary to UTI/bacteremia: Presented with leukopenia, hypothermia, weakness.  Urine culture showed E. coli.  Culture showed Staph epidermidis on 1 set, likely contaminant.  Repeat cultures have been negative.    Echo negative for endocarditis.  ID was following. He completed abx course   Hypothermia/Bradycardia: Bradycardia thought to be related hypothermia, risperidone.  TSH normal.  Patient transferred to [facility] for EP evaluation but apparently his bradycardia has improved and currently signed off saying no need of pacemaker.  Avoid nodal blocking agents. He is intermittently hypothermic, etiology not clear.  Sepsis has been already treated with antibiotics.   Schizoaffective disorder/bipolar disorder: Psychiatry was following.  Risperidone held.  Psychiatry  recommending to continue Invega IM Q28 days.  He needs to follow-up with psychiatry as an outpatient.  He  remains intermittently confused   Tobacco use: Continue nicotine patch.  Counseled cessation   Colon cancer: Will recommend to follow-up with oncology as soon as possible after discharge   Hypotension: Started on midodrine.   Thrombocytopenia: Stable, continue to monitor as outpatient   Debility/deconditioning: PT/OT consulted, recommended SNF on discharge.   Discharge Diagnoses:  Principal Problem:   Sepsis secondary to UTI [facility]) Active Problems:   Schizoaffective disorder, bipolar type (HCC)   Tobacco use disorder   Malignant neoplasm of ascending colon (HCC)   Pressure injury of skin   Bacteremia    Discharge Instructions  Discharge Instructions     Diet general   Complete by: As directed    Discharge instructions   Complete by: As directed    1)Please take prescribed medications as instructed 2)Follow up with psychiatry as an outpatient   Increase activity slowly   Complete by: As directed    No wound care   Complete by: As directed       Allergies as of 11/23/2022   No Known Allergies      Medication List     STOP taking these medications    haloperidol decanoate 100 MG/ML injection Commonly known as: HALDOL DECANOATE   risperiDONE 0.5 MG tablet Commonly known as: RISPERDAL       TAKE these medications    paliperidone 156 MG/ML Susy injection Commonly known as: INVEGA SUSTENNA Inject 1 mL (156 mg total) into the muscle once for 1 dose. Start taking on: November 30, 2022 What changed: You were already taking a medication with the same name, and this prescription was added. Make sure you understand how and when to take each.   Hinda Glatter Sustenna 234 MG/1.5ML injection Generic drug: paliperidone Inject  234 mg into the muscle every 28 (twenty-eight) days. Start taking on: December 20, 2022 What changed:  how much to take when to take this    midodrine 2.5 MG tablet Commonly known as: PROAMATINE Take 1 tablet (2.5 mg total) by mouth 3 (three) times daily with meals.   nicotine 14 mg/24hr patch Commonly known as: NICODERM CQ - dosed in mg/24 hours Place 1 patch (14 mg total) onto the skin daily.   paliperidone 3 MG 24 hr tablet Commonly known as: INVEGA Take 1 tablet (3 mg total) by mouth daily.        Follow-up Information     Dustin Kuster, NP. Schedule an appointment as soon as possible for a visit in 1 week(s).   Specialty: Nurse Practitioner Contact information: [address] [place] [address] [phone]                No Known Allergies  Consultations: Psychiatry   Procedures/Studies: ECHOCARDIOGRAM COMPLETE  Result Date: 11/13/2022    ECHOCARDIOGRAM REPORT   Patient Name:   Dustin [name] Date of Exam: 11/13/2022 Medical Rec #:  [id number]         Height:       67.0 in Accession #:    [id number]        Weight:       151.5 lb Date of Birth:  [birth year]         BSA:          1.797 m Patient Age:    66 years          BP:           101/63 mmHg Patient Gender: M                 HR:           49 bpm. [facility] Procedure: 2D Echo, Cardiac Doppler and Color Doppler Indications:    Bacteremia R78.81  History:        Patient has no prior history of Echocardiogram examinations.                 Signs/Symptoms:Syncope; Risk Factors:Current Smoker. Hx of                 Schizoaffective disorder, bipolar type (HCC), COVID-19 virus                 infection, Adenocarcinoma of colon (HCC).  Sonographer:    Celesta Gentile RCS Referring Phys: [phone] Dustin [name] IMPRESSIONS  1. Left ventricular ejection fraction, by estimation, is 55 to 60%. The left ventricle has normal function. The left ventricle has no regional wall motion abnormalities. Left ventricular diastolic parameters were normal.  2. Right ventricular systolic function is normal. The right ventricular size is normal. There is normal  pulmonary artery systolic pressure.  3. Left atrial size was moderately dilated.  4. Right atrial size was moderately dilated.  5. The mitral valve is normal in structure. No evidence of mitral valve regurgitation. No evidence of mitral stenosis.  6. The tricuspid valve is abnormal.  7. The aortic valve is tricuspid. Aortic valve regurgitation is not visualized. No aortic stenosis is present.  8. The inferior vena cava is dilated in size with >50% respiratory variability, suggesting right atrial pressure of 8 mmHg. FINDINGS  Left Ventricle: Left ventricular ejection fraction, by estimation, is 55 to 60%. The left ventricle has normal function. The left ventricle has no regional wall motion  abnormalities. The left ventricular internal cavity size was normal in size. There is  no left ventricular hypertrophy. Left ventricular diastolic parameters were normal. Right Ventricle: The right ventricular size is normal. Right vetricular wall thickness was not well visualized. Right ventricular systolic function is normal. There is normal pulmonary artery systolic pressure. The tricuspid regurgitant velocity is 2.51 m/s, and with an assumed right atrial pressure of 8 mmHg, the estimated right ventricular systolic pressure is 33.2 mmHg. Left Atrium: Left atrial size was moderately dilated. Right Atrium: Right atrial size was moderately dilated. Pericardium: There is no evidence of pericardial effusion. Mitral Valve: The mitral valve is normal in structure. No evidence of mitral valve regurgitation. No evidence of mitral valve stenosis. Tricuspid Valve: The tricuspid valve is abnormal. Tricuspid valve regurgitation is mild . No evidence of tricuspid stenosis. Aortic Valve: The aortic valve is tricuspid. Aortic valve regurgitation is not visualized. No aortic stenosis is present. Aortic valve mean gradient measures 3.5 mmHg. Aortic valve peak gradient measures 8.1 mmHg. Aortic valve area, by VTI measures 2.19 cm. Pulmonic  Valve: The pulmonic valve was not well visualized. Pulmonic valve regurgitation is mild. No evidence of pulmonic stenosis. Aorta: The aortic root is normal in size and structure. Venous: The inferior vena cava is dilated in size with greater than 50% respiratory variability, suggesting right atrial pressure of 8 mmHg. IAS/Shunts: No atrial level shunt detected by color flow Doppler.  LEFT VENTRICLE PLAX 2D LVIDd:         5.00 cm   Diastology LVIDs:         3.10 cm   LV e' medial:    8.70 cm/s LV PW:         1.00 cm   LV E/e' medial:  8.0 LV IVS:        1.00 cm   LV e' lateral:   15.40 cm/s LVOT diam:     2.10 cm   LV E/e' lateral: 4.5 LV SV:         69 LV SV Index:   39 LVOT Area:     3.46 cm  RIGHT VENTRICLE RV S prime:     12.80 cm/s TAPSE (M-mode): 2.4 cm LEFT ATRIUM             Index        RIGHT ATRIUM           Index LA diam:        2.40 cm 1.34 cm/m   RA Area:     21.40 cm LA Vol (A2C):   82.9 ml 46.14 ml/m  RA Volume:   69.20 ml  38.51 ml/m LA Vol (A4C):   75.3 ml 41.91 ml/m LA Biplane Vol: 83.4 ml 46.41 ml/m  AORTIC VALVE AV Area (Vmax):    2.07 cm AV Area (Vmean):   2.25 cm AV Area (VTI):     2.19 cm AV Vmax:           141.88 cm/s AV Vmean:          86.244 cm/s AV VTI:            0.316 m AV Peak Grad:      8.1 mmHg AV Mean Grad:      3.5 mmHg LVOT Vmax:         84.60 cm/s LVOT Vmean:        56.000 cm/s LVOT VTI:          0.200 m LVOT/AV VTI ratio: 0.63  AORTA Ao Root diam: 3.70 cm MITRAL VALVE               TRICUSPID VALVE MV Area (PHT): 4.60 cm    TR Peak grad:   25.2 mmHg MV Decel Time: 165 msec    TR Vmax:        251.00 cm/s MV E velocity: 69.70 cm/s MV A velocity: 38.80 cm/s  SHUNTS MV E/A ratio:  1.80        Systemic VTI:  0.20 m                            Systemic Diam: 2.10 cm Dina Rich MD Electronically signed by Dina Rich MD Signature Date/Time: 11/13/2022/12:12:08 PM    Final    CT Hip Right Wo Contrast  Result Date: 11/10/2022 CLINICAL DATA:  Hip trauma, fracture  suspected.  Unwitnessed fall. EXAM: CT OF THE RIGHT HIP WITHOUT CONTRAST TECHNIQUE: Multidetector CT imaging of the right hip was performed according to the standard protocol. Multiplanar CT image reconstructions were also generated. RADIATION DOSE REDUCTION: This exam was performed according to the departmental dose-optimization program which includes automated exposure control, adjustment of the mA and/or kV according to patient size and/or use of iterative reconstruction technique. COMPARISON:  Radiograph performed earlier on the same date. FINDINGS: Bones/Joint/Cartilage No evidence of fracture or dislocation. No appreciable joint effusion. Mild superolateral hip joint space narrowing with marginal spurring suggesting mild osteoarthritis. Sacroiliac joint and pubic symphysis are intact. Pubic bones are intact. Ligaments Suboptimally assessed by CT. Muscles and Tendons Muscles are normal in bulk. No intramuscular hematoma or fluid collection. Tendons of the flexor, extensor and adductor compartments are intact. Soft tissues Vascular calcifications. Skin and subcutaneous soft tissues are otherwise within normal limits. Pelvic viscera: Colonic diverticulosis without evidence of acute diverticulitis. IMPRESSION: 1. No evidence of fracture or dislocation. 2. Mild osteoarthritis of the right hip. 3. Muscles and tendons are intact. 4. Colonic diverticulosis without evidence of acute diverticulitis. 5. Vascular calcifications. Electronically Signed   By: Larose Hires D.O.   On: 11/10/2022 16:22   DG Shoulder Right  Result Date: 11/10/2022 CLINICAL DATA:  Weakness.  Fall EXAM: RIGHT SHOULDER - 3 VIEW COMPARISON:  None Available. FINDINGS: Osteopenia. Slight degenerative changes along the [facility] joint with well corticated ossific density superiorly. Small osteophytes. Preserved glenohumeral joint. No fracture or dislocation. IMPRESSION: Osteopenia.  Slight degenerative changes of the [facility] joint. Electronically Signed   By:  Karen Kays M.D.   On: 11/10/2022 12:31   DG Hip Unilat With Pelvis 2-3 Views Right  Result Date: 11/10/2022 CLINICAL DATA:  Pain after fall EXAM: DG HIP (WITH OR WITHOUT PELVIS) 3V RIGHT COMPARISON:  None Available. FINDINGS: On one view there is slight irregularity of the left femoral neck cortex and trabecula. Mild joint space loss of the hip joints and sacroiliac joints. Hyperostosis. There is however osteopenia. With this level of osteopenia a subtle nondisplaced injury is difficult to completely exclude and if needed additional cross-sectional study as clinically directed. Scattered vascular calcifications. The extreme upper aspect of the iliac crests are obscured by overlapping structures. IMPRESSION: On one view there is slight cortical irregularity and trabecula of the left femoral neck. Subtle injury is difficult to exclude. Recommend cross-sectional study to exclude injury when clinically appropriate. Mild degenerative changes.  Osteopenia Electronically Signed   By: Karen Kays M.D.   On: 11/10/2022 12:30   DG Chest [facility]  Result Date: 11/10/2022 CLINICAL DATA:  Weakness.  Fall EXAM: PORTABLE CHEST 1 VIEW COMPARISON:  Chest x-ray 07/20/2022 and CT scan FINDINGS: Hyperinflation. Stable cardiopericardial silhouette with calcified and tortuous aorta. Interstitial changes are again seen. The hazy opacity along the left hemithorax is slightly improving on the current exam. Overlapping cardiac leads and defibrillator pads. Film is rotated to the left. Question some subglottic narrowing. IMPRESSION: Hyperinflation with chronic interstitial changes. Slight asymmetric opacity in the left hemithorax is decreased today. Question subglottic narrowing on the frontal view. Please correlate with symptoms and additional evaluation to confirm when clinically appropriate Electronically Signed   By: Karen Kays M.D.   On: 11/10/2022 12:28      Subjective:  Patient seen and examined at bedside today.   Hemodynamically stable.  He is much more alert and oriented today.  He knows that he is in the hospital.  He knows that the current month is June.  Appears very comfortable.I called his brother and updated about the discharge plan  Discharge Exam: Vitals:   11/23/22 0800 11/23/22 0900  BP: (!) 155/86 119/65  Pulse: 85 68  Resp: 12 16  Temp:  97.7 F (36.5 C)  SpO2: 97% 95%   Vitals:   11/23/22 0200 11/23/22 0300 11/23/22 0800 11/23/22 0900  BP: 134/77 120/65 (!) 155/86 119/65  Pulse:  60 85 68  Resp: 14 15 12 16   Temp:  98.1 F (36.7 C)  97.7 F (36.5 C)  TempSrc:  Oral  Oral  SpO2:  97% 97% 95%  Weight:      Height:        General: Pt is alert, awake, not in acute distress Cardiovascular: RRR, S1/S2 +, no rubs, no gallops Respiratory: CTA bilaterally, no wheezing, no rhonchi Abdominal: Soft, NT, ND, bowel sounds + Extremities: no edema, no cyanosis    The results of significant diagnostics from this hospitalization (including imaging, microbiology, ancillary and laboratory) are listed below for reference.     Microbiology: No results found for this or any previous visit (from the past 240 hour(s)).   Labs: BNP (last 3 results) No results for input(s): "BNP" in the last 8760 hours. Basic Metabolic Panel: Recent Labs  Lab 11/17/22 0533 11/18/22 0106  NA  --  137  K  --  4.1  CL  --  102  CO2  --  25  GLUCOSE  --  95  BUN  --  12  CREATININE 0.99 0.94  CALCIUM  --  9.1  MG  --  1.9   Liver Function Tests: No results for input(s): "AST", "ALT", "ALKPHOS", "BILITOT", "PROT", "ALBUMIN" in the last 168 hours. No results for input(s): "LIPASE", "AMYLASE" in the last 168 hours. No results for input(s): "AMMONIA" in the last 168 hours. CBC: Recent Labs  Lab 11/18/22 0106  WBC 4.3  HGB 12.4*  HCT 38.7*  MCV 83.4  PLT 110*   Cardiac Enzymes: No results for input(s): "CKTOTAL", "CKMB", "CKMBINDEX", "TROPONINI" in the last 168 hours. BNP: Invalid  input(s): "POCBNP" CBG: Recent Labs  Lab 11/21/22 1853 11/22/22 0015 11/22/22 0626 11/23/22 0103 11/23/22 0627  GLUCAP 139* 104* 100* 108* 76   D-Dimer No results for input(s): "DDIMER" in the last 72 hours. Hgb A1c No results for input(s): "HGBA1C" in the last 72 hours. Lipid Profile No results for input(s): "CHOL", "HDL", "LDLCALC", "TRIG", "CHOLHDL", "LDLDIRECT" in the last 72 hours. Thyroid function studies No results for input(s): "TSH", "T4TOTAL", "T3FREE", "THYROIDAB" in the last 72  hours.  Invalid input(s): "FREET3" Anemia work up No results for input(s): "VITAMINB12", "FOLATE", "FERRITIN", "TIBC", "IRON", "RETICCTPCT" in the last 72 hours. Urinalysis    Component Value Date/Time   COLORURINE YELLOW 11/10/2022 1124   APPEARANCEUR HAZY (A) 11/10/2022 1124   APPEARANCEUR Hazy 05/14/2013 1123   LABSPEC 1.012 11/10/2022 1124   LABSPEC 1.025 05/14/2013 1123   PHURINE 7.0 11/10/2022 1124   GLUCOSEU NEGATIVE 11/10/2022 1124   GLUCOSEU Negative 05/14/2013 1123   HGBUR NEGATIVE 11/10/2022 1124   BILIRUBINUR NEGATIVE 11/10/2022 1124   BILIRUBINUR small 10/03/2021 1026   BILIRUBINUR Negative 05/14/2013 1123   KETONESUR NEGATIVE 11/10/2022 1124   PROTEINUR NEGATIVE 11/10/2022 1124   UROBILINOGEN negative (A) 10/03/2021 1026   NITRITE POSITIVE (A) 11/10/2022 1124   LEUKOCYTESUR MODERATE (A) 11/10/2022 1124   LEUKOCYTESUR Negative 05/14/2013 1123   Sepsis Labs Recent Labs  Lab 11/18/22 0106  WBC 4.3   Microbiology No results found for this or any previous visit (from the past 240 hour(s)).  Please note: You were cared for by a hospitalist during your hospital stay. Once you are discharged, your primary care physician will handle any further medical issues. Please note that NO REFILLS for any discharge medications will be authorized once you are discharged, as it is imperative that you return to your primary care physician (or establish a relationship with a primary  care physician if you do not have one) for your post hospital discharge needs so that they can reassess your need for medications and monitor your lab values.    Time coordinating discharge: 40 minutes  SIGNED:   Burnadette Pop, MD  [facility] 11/23/2022, 10:43 AM Pager [phone]  If 7PM-7AM, please contact night-coverage [email or web address] Password [email or web address]

## 2022-11-23 NOTE — Consult Note (Addendum)
Face to Face psych Consultation   Reason for Consult:  Management of medication. Referring Physician: Hartley Barefoot MD  Patient Identification: Dustin [name] MRN:  [id number] Principal Diagnosis: Sepsis secondary to UTI [facility]) Diagnosis:  Principal Problem:   Sepsis secondary to UTI [facility]) Active Problems:   Tobacco use disorder   Schizoaffective disorder, bipolar type (HCC)   Malignant neoplasm of ascending colon (HCC)   Pressure injury of skin   Bacteremia   Total Time spent with patient: 20 minutes  HPI:  Per admission assessment note: " Dustin [name] is a 67 y.o. male.  He is brought in by EMS for possible code sepsis.  He reportedly had a fall overnight at his facility and was put back to bed.  This morning is complaining of right shoulder and right hip pain.  EMS noted blood pressure low, bradycardic, low temperature."  Subjective:   Dustin [name] is a 67 y.o. male who was seen and reevaluated.  He was initially seen by telemetry assessment on 11/15/18/2024 for a medication assessment.  One of the considerations was the fact that the patient may be bradycardic secondary to medications.   On examination today:  During evaluation Dustin [name] is sitting upright in bed and remains pleasantly confused. He references his bible again on multiple occasions. Although he is a poor historian he seems be excited about discharging today " I honestly dont know why I am still here." He is eating and sleeping well. He denies any side effects from his long acting injection yesterday. On today's reassessment patient did not present with any focal neurological deficit, he was able to speak clearly, follow commands, and denies weakness.   At this current time, patient's pre-existing condition that was substantially aggravated prior to this admission has returned to previous level.   He does present with decrease in attention or concentration, he is alert and oriented x 2, shows  short-term memory deficit.  At this time it is felt that patient has returned to psychiatric baseline, and will be stable to discharge home. He denies suicidal ideation, homicidal ideation, and or auditory or visual hallucinations.  Past Psychiatric History: See HPI  Risk to Self:  Denies  Risk to Others:  Denies. Prior Inpatient Therapy:   Prior Outpatient Therapy:    Past Medical History:  Past Medical History:  Diagnosis Date   Schizophrenia [facility])     Past Surgical History:  Procedure Laterality Date   COLON SURGERY     COLONOSCOPY N/A 10/06/2021   Procedure: COLONOSCOPY;  Surgeon: Toledo, Boykin Nearing, MD;  Location: [facility] ENDOSCOPY;  Service: Gastroenterology;  Laterality: N/A;   ESOPHAGOGASTRODUODENOSCOPY N/A 10/06/2021   Procedure: ESOPHAGOGASTRODUODENOSCOPY (EGD);  Surgeon: Toledo, Boykin Nearing, MD;  Location: [facility] ENDOSCOPY;  Service: Gastroenterology;  Laterality: N/A;   PARTIAL COLECTOMY  10/13/2021   Procedure: RIGHT COLECTOMY;  Surgeon: Leafy Ro, MD;  Location: [facility] ORS;  Service: General;;   Family History:  Family History  Problem Relation Age of Onset   Lung cancer Father    Hypertension Brother    Hypertension Brother    Hypertension Brother    Prostate cancer Brother    Family Psychiatric  History:  Social History:  Social History   Substance and Sexual Activity  Alcohol Use Not Currently     Social History   Substance and Sexual Activity  Drug Use No    Social History   Socioeconomic History   Marital status: Single    Spouse name:  Not on file   Number of children: Not on file   Years of education: Not on file   Highest education level: Not on file  Occupational History   Not on file  Tobacco Use   Smoking status: Every Day    Packs/day: 0.50    Years: 15.00    Additional pack years: 0.00    Total pack years: 7.50    Types: Cigarettes   Smokeless tobacco: Never  Vaping Use   Vaping Use: Never used  Substance and Sexual Activity    Alcohol use: Not Currently   Drug use: No   Sexual activity: Never    Birth control/protection: None  Other Topics Concern   Not on file  Social History Narrative   Not on file   Social Determinants of Health   Financial Resource Strain: Not on file  Food Insecurity: Not on file  Transportation Needs: Not on file  Physical Activity: Not on file  Stress: Not on file  Social Connections: Not on file   Additional Social History:    Allergies:  No Known Allergies  Labs:  Results for orders placed or performed during the hospital encounter of 11/10/22 (from the past 48 hour(s))  Glucose, capillary     Status: Abnormal   Collection Time: 11/21/22  6:53 PM  Result Value Ref Range   Glucose-Capillary 139 (H) 70 - 99 mg/dL    Comment: Glucose reference range applies only to samples taken after fasting for at least 8 hours.  Glucose, capillary     Status: Abnormal   Collection Time: 11/22/22 12:15 AM  Result Value Ref Range   Glucose-Capillary 104 (H) 70 - 99 mg/dL    Comment: Glucose reference range applies only to samples taken after fasting for at least 8 hours.   Comment 1 Notify RN    Comment 2 Document in Chart   Glucose, capillary     Status: Abnormal   Collection Time: 11/22/22  6:26 AM  Result Value Ref Range   Glucose-Capillary 100 (H) 70 - 99 mg/dL    Comment: Glucose reference range applies only to samples taken after fasting for at least 8 hours.  Glucose, capillary     Status: Abnormal   Collection Time: 11/23/22  1:03 AM  Result Value Ref Range   Glucose-Capillary 108 (H) 70 - 99 mg/dL    Comment: Glucose reference range applies only to samples taken after fasting for at least 8 hours.   Comment 1 Notify RN    Comment 2 Document in Chart   Glucose, capillary     Status: None   Collection Time: 11/23/22  6:27 AM  Result Value Ref Range   Glucose-Capillary 76 70 - 99 mg/dL    Comment: Glucose reference range applies only to samples taken after fasting for at  least 8 hours.   Comment 1 Notify RN    Comment 2 Document in Chart   Glucose, capillary     Status: Abnormal   Collection Time: 11/23/22 11:46 AM  Result Value Ref Range   Glucose-Capillary 169 (H) 70 - 99 mg/dL    Comment: Glucose reference range applies only to samples taken after fasting for at least 8 hours.   Comment 1 Notify RN    Comment 2 Document in Chart     Medications:  Current Facility-Administered Medications  Medication Dose Route Frequency Provider Last Rate Last Admin   0.9 %  sodium chloride infusion  250 mL Intravenous Continuous Adefeso, Oladapo,  DO   Stopped at 11/19/22 1857   acetaminophen (TYLENOL) tablet 650 mg  650 mg Oral Q6H PRN Vassie Loll, MD       Or   acetaminophen (TYLENOL) suppository 650 mg  650 mg Rectal Q6H PRN Vassie Loll, MD       ALPRAZolam Prudy Feeler) tablet 0.25 mg  0.25 mg Oral Q8H PRN Vassie Loll, MD   0.25 mg at 11/16/22 2151   Chlorhexidine Gluconate Cloth 2 % PADS 6 each  6 each Topical Daily Vassie Loll, MD   6 each at 11/23/22 0800   haloperidol lactate (HALDOL) injection 3 mg  3 mg Intravenous Q6H PRN Burnadette Pop, MD       midodrine (PROAMATINE) tablet 2.5 mg  2.5 mg Oral TID WC Antoine Poche, MD   2.5 mg at 11/22/22 0846   nicotine (NICODERM CQ - dosed in mg/24 hours) patch 14 mg  14 mg Transdermal Daily Vassie Loll, MD   14 mg at 11/23/22 1042   ondansetron (ZOFRAN) tablet 4 mg  4 mg Oral Q6H PRN Vassie Loll, MD       Or   ondansetron [facility]) injection 4 mg  4 mg Intravenous Q6H PRN Vassie Loll, MD       Oral care mouth rinse  15 mL Mouth Rinse PRN Vassie Loll, MD       paliperidone (INVEGA SUSTENNA) injection 234 mg  234 mg Intramuscular Q28 days Rex Kras, MD   234 mg at 11/22/22 0043   paliperidone (INVEGA) 24 hr tablet 3 mg  3 mg Oral Daily Maryagnes Amos, FNP   3 mg at 11/23/22 1042   Current Outpatient Medications  Medication Sig Dispense Refill   [START ON 11/30/2022] paliperidone  (INVEGA SUSTENNA) 156 MG/ML SUSY injection Inject 1 mL (156 mg total) into the muscle once for 1 dose. 1 mL 0   [START ON 12/20/2022] INVEGA SUSTENNA 234 MG/1.5ML injection Inject 234 mg into the muscle every 28 (twenty-eight) days. 1.8 mL    midodrine (PROAMATINE) 2.5 MG tablet Take 1 tablet (2.5 mg total) by mouth 3 (three) times daily with meals.     nicotine (NICODERM CQ - DOSED IN MG/24 HOURS) 14 mg/24hr patch Place 1 patch (14 mg total) onto the skin daily. 28 patch 0   paliperidone (INVEGA) 3 MG 24 hr tablet Take 1 tablet (3 mg total) by mouth daily. 7 tablet 0    Musculoskeletal:  Psychiatric Specialty Exam:  Presentation  General Appearance:  Appropriate for Environment; Casual  Eye Contact: Good  Speech: Clear and Coherent; Slow  Speech Volume: low  Handedness: Right   Mood and Affect  Mood: Euthymic  Affect: Appropriate; Congruent   Thought Process  Thought Processes: Coherent; Linear  Descriptions of Associations:Intact  Orientation:parital( x2)   Thought Content:Paranoid Ideation; Delusions  History of Schizophrenia/Schizoaffective disorder:Yes  Duration of Psychotic Symptoms:Greater than six months  Hallucinations: Denies   Ideas of Reference:paranoia and delusions  Suicidal Thoughts:Denies   Homicidal Thoughts:Denies    Sensorium  Memory: Immediate Fair; Recent Fair; Remote Fair  Judgment: Impaired  Insight: Shallow   Executive Functions  Concentration: Fair  Attention Span: Fair  Recall: Fair  Fund of Knowledge: Good  Language: Fair   Psychomotor Activity  Psychomotor Activity:WNL    Assets  Assets: Communication Skills; Housing   Sleep  Sleep:No data recorded     Physical Exam: Physical Exam Vitals and nursing note reviewed.  Constitutional:      Appearance: Normal appearance.  Cardiovascular:     Rate and Rhythm: Normal rate and regular rhythm.  Neurological:     Mental Status: He is  alert and oriented to person, place, and time.  Psychiatric:        Mood and Affect: Mood normal.        Behavior: Behavior normal.        Thought Content: Thought content normal.    ROS Blood pressure 122/73, pulse 74, temperature 97.9 F (36.6 C), temperature source Oral, resp. rate 19, height 5\' 7"  (1.702 m), weight 66 kg, SpO2 99 %. Body mass index is 22.79 kg/m.  Treatment Plan Summary: Medication management He is a patient of bradycardia he is stabilized.  Recommendation was to hold Haldol decanoate given his bradycardia, however his QTc on 11/10/2022 was 388 .  Patient appears to have chronic bradycardia.  Continue oral Invega 3 mg a day x 7 doses. Will provided overlap coverage as patient was originally on two antipsychotics prior to admission.    Recommend next dose of Invega Sustenna 234 mg IM has been ordered for 07/17. He will need Invega Sustenna 156 mg on 06/27.   Continue prn IV haldol.    He was noted to be medically stable for discharge whenever possible. Consult team will follow from a distance.   Disposition: No evidence of imminent risk to self or others at present.   Patient does not meet criteria for psychiatric inpatient admission. Supportive therapy provided about ongoing stressors. Discussed crisis plan, support from social network, calling 911, coming to the Emergency Department, and calling Suicide Hotline.   11/23/2022 6:02 PM

## 2022-11-23 NOTE — Progress Notes (Signed)
Patient AVS printed and given to PTAR. PIV removed. Telemetry box removed, CCMD notified.   Kenard Gower, RN

## 2022-11-24 DIAGNOSIS — R269 Unspecified abnormalities of gait and mobility: Secondary | ICD-10-CM | POA: Diagnosis not present

## 2022-11-24 DIAGNOSIS — N390 Urinary tract infection, site not specified: Secondary | ICD-10-CM | POA: Diagnosis not present

## 2022-11-24 DIAGNOSIS — A419 Sepsis, unspecified organism: Secondary | ICD-10-CM | POA: Diagnosis not present

## 2022-11-27 ENCOUNTER — Ambulatory Visit: Admit: 2022-11-27 | Payer: 59

## 2022-11-27 ENCOUNTER — Encounter: Payer: Self-pay | Admitting: Oncology

## 2022-11-27 DIAGNOSIS — E119 Type 2 diabetes mellitus without complications: Secondary | ICD-10-CM | POA: Diagnosis not present

## 2022-11-27 DIAGNOSIS — Z720 Tobacco use: Secondary | ICD-10-CM | POA: Diagnosis not present

## 2022-11-27 DIAGNOSIS — M6281 Muscle weakness (generalized): Secondary | ICD-10-CM | POA: Diagnosis not present

## 2022-11-27 DIAGNOSIS — D638 Anemia in other chronic diseases classified elsewhere: Secondary | ICD-10-CM | POA: Diagnosis not present

## 2022-11-27 DIAGNOSIS — R001 Bradycardia, unspecified: Secondary | ICD-10-CM | POA: Diagnosis not present

## 2022-11-27 DIAGNOSIS — C189 Malignant neoplasm of colon, unspecified: Secondary | ICD-10-CM | POA: Diagnosis not present

## 2022-11-27 DIAGNOSIS — D696 Thrombocytopenia, unspecified: Secondary | ICD-10-CM | POA: Diagnosis not present

## 2022-11-27 DIAGNOSIS — R7881 Bacteremia: Secondary | ICD-10-CM | POA: Diagnosis not present

## 2022-11-27 DIAGNOSIS — I959 Hypotension, unspecified: Secondary | ICD-10-CM | POA: Diagnosis not present

## 2022-11-27 DIAGNOSIS — N390 Urinary tract infection, site not specified: Secondary | ICD-10-CM | POA: Diagnosis not present

## 2022-11-27 SURGERY — COLONOSCOPY WITH PROPOFOL
Anesthesia: General

## 2022-11-28 ENCOUNTER — Encounter: Payer: Self-pay | Admitting: Oncology

## 2022-11-29 DIAGNOSIS — I959 Hypotension, unspecified: Secondary | ICD-10-CM | POA: Diagnosis not present

## 2022-11-29 DIAGNOSIS — M6281 Muscle weakness (generalized): Secondary | ICD-10-CM | POA: Diagnosis not present

## 2022-12-02 DIAGNOSIS — Z720 Tobacco use: Secondary | ICD-10-CM | POA: Diagnosis not present

## 2022-12-02 DIAGNOSIS — I959 Hypotension, unspecified: Secondary | ICD-10-CM | POA: Diagnosis not present

## 2022-12-02 DIAGNOSIS — M6281 Muscle weakness (generalized): Secondary | ICD-10-CM | POA: Diagnosis not present

## 2022-12-04 ENCOUNTER — Telehealth: Payer: Self-pay | Admitting: Nurse Practitioner

## 2022-12-04 DIAGNOSIS — D638 Anemia in other chronic diseases classified elsewhere: Secondary | ICD-10-CM | POA: Diagnosis not present

## 2022-12-04 DIAGNOSIS — D696 Thrombocytopenia, unspecified: Secondary | ICD-10-CM | POA: Diagnosis not present

## 2022-12-04 DIAGNOSIS — E119 Type 2 diabetes mellitus without complications: Secondary | ICD-10-CM | POA: Diagnosis not present

## 2022-12-04 NOTE — Telephone Encounter (Signed)
Pt family member cancel awv due to pt being in rehab in [place]

## 2022-12-06 ENCOUNTER — Ambulatory Visit: Payer: 59 | Admitting: Nurse Practitioner

## 2022-12-09 DIAGNOSIS — C189 Malignant neoplasm of colon, unspecified: Secondary | ICD-10-CM | POA: Diagnosis not present

## 2022-12-09 DIAGNOSIS — R7881 Bacteremia: Secondary | ICD-10-CM | POA: Diagnosis not present

## 2022-12-09 DIAGNOSIS — M6281 Muscle weakness (generalized): Secondary | ICD-10-CM | POA: Diagnosis not present

## 2022-12-09 DIAGNOSIS — Z720 Tobacco use: Secondary | ICD-10-CM | POA: Diagnosis not present

## 2022-12-11 ENCOUNTER — Ambulatory Visit: Payer: 59 | Admitting: Nurse Practitioner

## 2022-12-12 DIAGNOSIS — M6281 Muscle weakness (generalized): Secondary | ICD-10-CM | POA: Diagnosis not present

## 2022-12-12 DIAGNOSIS — I959 Hypotension, unspecified: Secondary | ICD-10-CM | POA: Diagnosis not present

## 2022-12-12 DIAGNOSIS — E119 Type 2 diabetes mellitus without complications: Secondary | ICD-10-CM | POA: Diagnosis not present

## 2022-12-14 DIAGNOSIS — E119 Type 2 diabetes mellitus without complications: Secondary | ICD-10-CM | POA: Diagnosis not present

## 2022-12-18 DIAGNOSIS — A419 Sepsis, unspecified organism: Secondary | ICD-10-CM | POA: Diagnosis not present

## 2022-12-18 DIAGNOSIS — M6281 Muscle weakness (generalized): Secondary | ICD-10-CM | POA: Diagnosis not present

## 2022-12-18 DIAGNOSIS — R7881 Bacteremia: Secondary | ICD-10-CM | POA: Diagnosis not present

## 2022-12-18 DIAGNOSIS — R2681 Unsteadiness on feet: Secondary | ICD-10-CM | POA: Diagnosis not present

## 2022-12-18 DIAGNOSIS — R001 Bradycardia, unspecified: Secondary | ICD-10-CM | POA: Diagnosis not present

## 2022-12-18 DIAGNOSIS — N390 Urinary tract infection, site not specified: Secondary | ICD-10-CM | POA: Diagnosis not present

## 2022-12-18 DIAGNOSIS — R262 Difficulty in walking, not elsewhere classified: Secondary | ICD-10-CM | POA: Diagnosis not present

## 2022-12-19 DIAGNOSIS — R001 Bradycardia, unspecified: Secondary | ICD-10-CM | POA: Diagnosis not present

## 2022-12-19 DIAGNOSIS — N390 Urinary tract infection, site not specified: Secondary | ICD-10-CM | POA: Diagnosis not present

## 2022-12-19 DIAGNOSIS — M6281 Muscle weakness (generalized): Secondary | ICD-10-CM | POA: Diagnosis not present

## 2022-12-19 DIAGNOSIS — R7881 Bacteremia: Secondary | ICD-10-CM | POA: Diagnosis not present

## 2022-12-19 DIAGNOSIS — R2681 Unsteadiness on feet: Secondary | ICD-10-CM | POA: Diagnosis not present

## 2022-12-19 DIAGNOSIS — A419 Sepsis, unspecified organism: Secondary | ICD-10-CM | POA: Diagnosis not present

## 2022-12-19 DIAGNOSIS — R262 Difficulty in walking, not elsewhere classified: Secondary | ICD-10-CM | POA: Diagnosis not present

## 2022-12-20 DIAGNOSIS — R2681 Unsteadiness on feet: Secondary | ICD-10-CM | POA: Diagnosis not present

## 2022-12-20 DIAGNOSIS — I959 Hypotension, unspecified: Secondary | ICD-10-CM | POA: Diagnosis not present

## 2022-12-20 DIAGNOSIS — A419 Sepsis, unspecified organism: Secondary | ICD-10-CM | POA: Diagnosis not present

## 2022-12-20 DIAGNOSIS — N390 Urinary tract infection, site not specified: Secondary | ICD-10-CM | POA: Diagnosis not present

## 2022-12-20 DIAGNOSIS — M6281 Muscle weakness (generalized): Secondary | ICD-10-CM | POA: Diagnosis not present

## 2022-12-20 DIAGNOSIS — R262 Difficulty in walking, not elsewhere classified: Secondary | ICD-10-CM | POA: Diagnosis not present

## 2022-12-20 DIAGNOSIS — R7881 Bacteremia: Secondary | ICD-10-CM | POA: Diagnosis not present

## 2022-12-20 DIAGNOSIS — R001 Bradycardia, unspecified: Secondary | ICD-10-CM | POA: Diagnosis not present

## 2022-12-21 DIAGNOSIS — R001 Bradycardia, unspecified: Secondary | ICD-10-CM | POA: Diagnosis not present

## 2022-12-21 DIAGNOSIS — A419 Sepsis, unspecified organism: Secondary | ICD-10-CM | POA: Diagnosis not present

## 2022-12-21 DIAGNOSIS — R262 Difficulty in walking, not elsewhere classified: Secondary | ICD-10-CM | POA: Diagnosis not present

## 2022-12-21 DIAGNOSIS — M6281 Muscle weakness (generalized): Secondary | ICD-10-CM | POA: Diagnosis not present

## 2022-12-21 DIAGNOSIS — R2681 Unsteadiness on feet: Secondary | ICD-10-CM | POA: Diagnosis not present

## 2022-12-21 DIAGNOSIS — N390 Urinary tract infection, site not specified: Secondary | ICD-10-CM | POA: Diagnosis not present

## 2022-12-21 DIAGNOSIS — R7881 Bacteremia: Secondary | ICD-10-CM | POA: Diagnosis not present

## 2022-12-22 DIAGNOSIS — N390 Urinary tract infection, site not specified: Secondary | ICD-10-CM | POA: Diagnosis not present

## 2022-12-22 DIAGNOSIS — R262 Difficulty in walking, not elsewhere classified: Secondary | ICD-10-CM | POA: Diagnosis not present

## 2022-12-22 DIAGNOSIS — M6281 Muscle weakness (generalized): Secondary | ICD-10-CM | POA: Diagnosis not present

## 2022-12-22 DIAGNOSIS — R7881 Bacteremia: Secondary | ICD-10-CM | POA: Diagnosis not present

## 2022-12-22 DIAGNOSIS — R2681 Unsteadiness on feet: Secondary | ICD-10-CM | POA: Diagnosis not present

## 2022-12-22 DIAGNOSIS — R269 Unspecified abnormalities of gait and mobility: Secondary | ICD-10-CM | POA: Diagnosis not present

## 2022-12-22 DIAGNOSIS — A419 Sepsis, unspecified organism: Secondary | ICD-10-CM | POA: Diagnosis not present

## 2022-12-22 DIAGNOSIS — R001 Bradycardia, unspecified: Secondary | ICD-10-CM | POA: Diagnosis not present

## 2022-12-25 DIAGNOSIS — R7881 Bacteremia: Secondary | ICD-10-CM | POA: Diagnosis not present

## 2022-12-25 DIAGNOSIS — R262 Difficulty in walking, not elsewhere classified: Secondary | ICD-10-CM | POA: Diagnosis not present

## 2022-12-25 DIAGNOSIS — R001 Bradycardia, unspecified: Secondary | ICD-10-CM | POA: Diagnosis not present

## 2022-12-25 DIAGNOSIS — N390 Urinary tract infection, site not specified: Secondary | ICD-10-CM | POA: Diagnosis not present

## 2022-12-25 DIAGNOSIS — A419 Sepsis, unspecified organism: Secondary | ICD-10-CM | POA: Diagnosis not present

## 2022-12-25 DIAGNOSIS — R2681 Unsteadiness on feet: Secondary | ICD-10-CM | POA: Diagnosis not present

## 2022-12-25 DIAGNOSIS — M6281 Muscle weakness (generalized): Secondary | ICD-10-CM | POA: Diagnosis not present

## 2022-12-26 DIAGNOSIS — I959 Hypotension, unspecified: Secondary | ICD-10-CM | POA: Diagnosis not present

## 2022-12-26 DIAGNOSIS — M6281 Muscle weakness (generalized): Secondary | ICD-10-CM | POA: Diagnosis not present

## 2022-12-26 DIAGNOSIS — R001 Bradycardia, unspecified: Secondary | ICD-10-CM | POA: Diagnosis not present

## 2022-12-26 DIAGNOSIS — A419 Sepsis, unspecified organism: Secondary | ICD-10-CM | POA: Diagnosis not present

## 2022-12-26 DIAGNOSIS — R7881 Bacteremia: Secondary | ICD-10-CM | POA: Diagnosis not present

## 2022-12-26 DIAGNOSIS — N390 Urinary tract infection, site not specified: Secondary | ICD-10-CM | POA: Diagnosis not present

## 2022-12-26 DIAGNOSIS — R2681 Unsteadiness on feet: Secondary | ICD-10-CM | POA: Diagnosis not present

## 2022-12-26 DIAGNOSIS — R262 Difficulty in walking, not elsewhere classified: Secondary | ICD-10-CM | POA: Diagnosis not present

## 2022-12-27 DIAGNOSIS — R7881 Bacteremia: Secondary | ICD-10-CM | POA: Diagnosis not present

## 2022-12-27 DIAGNOSIS — M6281 Muscle weakness (generalized): Secondary | ICD-10-CM | POA: Diagnosis not present

## 2022-12-27 DIAGNOSIS — R2681 Unsteadiness on feet: Secondary | ICD-10-CM | POA: Diagnosis not present

## 2022-12-27 DIAGNOSIS — N390 Urinary tract infection, site not specified: Secondary | ICD-10-CM | POA: Diagnosis not present

## 2022-12-27 DIAGNOSIS — R262 Difficulty in walking, not elsewhere classified: Secondary | ICD-10-CM | POA: Diagnosis not present

## 2022-12-27 DIAGNOSIS — A419 Sepsis, unspecified organism: Secondary | ICD-10-CM | POA: Diagnosis not present

## 2022-12-27 DIAGNOSIS — R001 Bradycardia, unspecified: Secondary | ICD-10-CM | POA: Diagnosis not present

## 2022-12-28 DIAGNOSIS — R7881 Bacteremia: Secondary | ICD-10-CM | POA: Diagnosis not present

## 2022-12-28 DIAGNOSIS — R001 Bradycardia, unspecified: Secondary | ICD-10-CM | POA: Diagnosis not present

## 2022-12-28 DIAGNOSIS — A419 Sepsis, unspecified organism: Secondary | ICD-10-CM | POA: Diagnosis not present

## 2022-12-28 DIAGNOSIS — M6281 Muscle weakness (generalized): Secondary | ICD-10-CM | POA: Diagnosis not present

## 2022-12-28 DIAGNOSIS — N390 Urinary tract infection, site not specified: Secondary | ICD-10-CM | POA: Diagnosis not present

## 2022-12-28 DIAGNOSIS — R262 Difficulty in walking, not elsewhere classified: Secondary | ICD-10-CM | POA: Diagnosis not present

## 2022-12-28 DIAGNOSIS — R2681 Unsteadiness on feet: Secondary | ICD-10-CM | POA: Diagnosis not present

## 2022-12-29 DIAGNOSIS — M6281 Muscle weakness (generalized): Secondary | ICD-10-CM | POA: Diagnosis not present

## 2022-12-29 DIAGNOSIS — R262 Difficulty in walking, not elsewhere classified: Secondary | ICD-10-CM | POA: Diagnosis not present

## 2022-12-29 DIAGNOSIS — R001 Bradycardia, unspecified: Secondary | ICD-10-CM | POA: Diagnosis not present

## 2022-12-29 DIAGNOSIS — R7881 Bacteremia: Secondary | ICD-10-CM | POA: Diagnosis not present

## 2022-12-29 DIAGNOSIS — A419 Sepsis, unspecified organism: Secondary | ICD-10-CM | POA: Diagnosis not present

## 2022-12-29 DIAGNOSIS — R2681 Unsteadiness on feet: Secondary | ICD-10-CM | POA: Diagnosis not present

## 2022-12-29 DIAGNOSIS — N390 Urinary tract infection, site not specified: Secondary | ICD-10-CM | POA: Diagnosis not present

## 2023-01-01 DIAGNOSIS — R7881 Bacteremia: Secondary | ICD-10-CM | POA: Diagnosis not present

## 2023-01-01 DIAGNOSIS — R001 Bradycardia, unspecified: Secondary | ICD-10-CM | POA: Diagnosis not present

## 2023-01-01 DIAGNOSIS — A419 Sepsis, unspecified organism: Secondary | ICD-10-CM | POA: Diagnosis not present

## 2023-01-01 DIAGNOSIS — N390 Urinary tract infection, site not specified: Secondary | ICD-10-CM | POA: Diagnosis not present

## 2023-01-01 DIAGNOSIS — R262 Difficulty in walking, not elsewhere classified: Secondary | ICD-10-CM | POA: Diagnosis not present

## 2023-01-01 DIAGNOSIS — M6281 Muscle weakness (generalized): Secondary | ICD-10-CM | POA: Diagnosis not present

## 2023-01-01 DIAGNOSIS — R2681 Unsteadiness on feet: Secondary | ICD-10-CM | POA: Diagnosis not present

## 2023-01-02 DIAGNOSIS — R262 Difficulty in walking, not elsewhere classified: Secondary | ICD-10-CM | POA: Diagnosis not present

## 2023-01-02 DIAGNOSIS — R001 Bradycardia, unspecified: Secondary | ICD-10-CM | POA: Diagnosis not present

## 2023-01-02 DIAGNOSIS — F32A Depression, unspecified: Secondary | ICD-10-CM | POA: Diagnosis not present

## 2023-01-02 DIAGNOSIS — R7881 Bacteremia: Secondary | ICD-10-CM | POA: Diagnosis not present

## 2023-01-02 DIAGNOSIS — A419 Sepsis, unspecified organism: Secondary | ICD-10-CM | POA: Diagnosis not present

## 2023-01-02 DIAGNOSIS — R2681 Unsteadiness on feet: Secondary | ICD-10-CM | POA: Diagnosis not present

## 2023-01-02 DIAGNOSIS — N390 Urinary tract infection, site not specified: Secondary | ICD-10-CM | POA: Diagnosis not present

## 2023-01-02 DIAGNOSIS — M6281 Muscle weakness (generalized): Secondary | ICD-10-CM | POA: Diagnosis not present

## 2023-01-03 DIAGNOSIS — R2681 Unsteadiness on feet: Secondary | ICD-10-CM | POA: Diagnosis not present

## 2023-01-03 DIAGNOSIS — R7881 Bacteremia: Secondary | ICD-10-CM | POA: Diagnosis not present

## 2023-01-03 DIAGNOSIS — R001 Bradycardia, unspecified: Secondary | ICD-10-CM | POA: Diagnosis not present

## 2023-01-03 DIAGNOSIS — R262 Difficulty in walking, not elsewhere classified: Secondary | ICD-10-CM | POA: Diagnosis not present

## 2023-01-03 DIAGNOSIS — A419 Sepsis, unspecified organism: Secondary | ICD-10-CM | POA: Diagnosis not present

## 2023-01-03 DIAGNOSIS — N390 Urinary tract infection, site not specified: Secondary | ICD-10-CM | POA: Diagnosis not present

## 2023-01-03 DIAGNOSIS — I959 Hypotension, unspecified: Secondary | ICD-10-CM | POA: Diagnosis not present

## 2023-01-03 DIAGNOSIS — M6281 Muscle weakness (generalized): Secondary | ICD-10-CM | POA: Diagnosis not present

## 2023-01-04 DIAGNOSIS — R1311 Dysphagia, oral phase: Secondary | ICD-10-CM | POA: Diagnosis not present

## 2023-01-04 DIAGNOSIS — N390 Urinary tract infection, site not specified: Secondary | ICD-10-CM | POA: Diagnosis not present

## 2023-01-04 DIAGNOSIS — R262 Difficulty in walking, not elsewhere classified: Secondary | ICD-10-CM | POA: Diagnosis not present

## 2023-01-04 DIAGNOSIS — M6281 Muscle weakness (generalized): Secondary | ICD-10-CM | POA: Diagnosis not present

## 2023-01-05 DIAGNOSIS — R1311 Dysphagia, oral phase: Secondary | ICD-10-CM | POA: Diagnosis not present

## 2023-01-05 DIAGNOSIS — R262 Difficulty in walking, not elsewhere classified: Secondary | ICD-10-CM | POA: Diagnosis not present

## 2023-01-05 DIAGNOSIS — M6281 Muscle weakness (generalized): Secondary | ICD-10-CM | POA: Diagnosis not present

## 2023-01-05 DIAGNOSIS — N390 Urinary tract infection, site not specified: Secondary | ICD-10-CM | POA: Diagnosis not present

## 2023-01-08 DIAGNOSIS — N390 Urinary tract infection, site not specified: Secondary | ICD-10-CM | POA: Diagnosis not present

## 2023-01-08 DIAGNOSIS — R1311 Dysphagia, oral phase: Secondary | ICD-10-CM | POA: Diagnosis not present

## 2023-01-08 DIAGNOSIS — M6281 Muscle weakness (generalized): Secondary | ICD-10-CM | POA: Diagnosis not present

## 2023-01-08 DIAGNOSIS — R262 Difficulty in walking, not elsewhere classified: Secondary | ICD-10-CM | POA: Diagnosis not present

## 2023-01-09 DIAGNOSIS — N390 Urinary tract infection, site not specified: Secondary | ICD-10-CM | POA: Diagnosis not present

## 2023-01-09 DIAGNOSIS — M6281 Muscle weakness (generalized): Secondary | ICD-10-CM | POA: Diagnosis not present

## 2023-01-09 DIAGNOSIS — R1311 Dysphagia, oral phase: Secondary | ICD-10-CM | POA: Diagnosis not present

## 2023-01-09 DIAGNOSIS — I959 Hypotension, unspecified: Secondary | ICD-10-CM | POA: Diagnosis not present

## 2023-01-09 DIAGNOSIS — R262 Difficulty in walking, not elsewhere classified: Secondary | ICD-10-CM | POA: Diagnosis not present

## 2023-01-09 DIAGNOSIS — R001 Bradycardia, unspecified: Secondary | ICD-10-CM | POA: Diagnosis not present

## 2023-01-10 DIAGNOSIS — M6281 Muscle weakness (generalized): Secondary | ICD-10-CM | POA: Diagnosis not present

## 2023-01-10 DIAGNOSIS — N390 Urinary tract infection, site not specified: Secondary | ICD-10-CM | POA: Diagnosis not present

## 2023-01-10 DIAGNOSIS — R262 Difficulty in walking, not elsewhere classified: Secondary | ICD-10-CM | POA: Diagnosis not present

## 2023-01-10 DIAGNOSIS — R1311 Dysphagia, oral phase: Secondary | ICD-10-CM | POA: Diagnosis not present

## 2023-01-11 DIAGNOSIS — M6281 Muscle weakness (generalized): Secondary | ICD-10-CM | POA: Diagnosis not present

## 2023-01-11 DIAGNOSIS — N390 Urinary tract infection, site not specified: Secondary | ICD-10-CM | POA: Diagnosis not present

## 2023-01-11 DIAGNOSIS — R1311 Dysphagia, oral phase: Secondary | ICD-10-CM | POA: Diagnosis not present

## 2023-01-11 DIAGNOSIS — R262 Difficulty in walking, not elsewhere classified: Secondary | ICD-10-CM | POA: Diagnosis not present

## 2023-01-12 DIAGNOSIS — A419 Sepsis, unspecified organism: Secondary | ICD-10-CM | POA: Diagnosis not present

## 2023-01-12 DIAGNOSIS — R1311 Dysphagia, oral phase: Secondary | ICD-10-CM | POA: Diagnosis not present

## 2023-01-12 DIAGNOSIS — M6281 Muscle weakness (generalized): Secondary | ICD-10-CM | POA: Diagnosis not present

## 2023-01-12 DIAGNOSIS — R262 Difficulty in walking, not elsewhere classified: Secondary | ICD-10-CM | POA: Diagnosis not present

## 2023-01-12 DIAGNOSIS — N390 Urinary tract infection, site not specified: Secondary | ICD-10-CM | POA: Diagnosis not present

## 2023-01-12 DIAGNOSIS — R269 Unspecified abnormalities of gait and mobility: Secondary | ICD-10-CM | POA: Diagnosis not present

## 2023-01-14 DIAGNOSIS — C189 Malignant neoplasm of colon, unspecified: Secondary | ICD-10-CM | POA: Diagnosis not present

## 2023-01-14 DIAGNOSIS — R1311 Dysphagia, oral phase: Secondary | ICD-10-CM | POA: Diagnosis not present

## 2023-01-14 DIAGNOSIS — N390 Urinary tract infection, site not specified: Secondary | ICD-10-CM | POA: Diagnosis not present

## 2023-01-14 DIAGNOSIS — R262 Difficulty in walking, not elsewhere classified: Secondary | ICD-10-CM | POA: Diagnosis not present

## 2023-01-14 DIAGNOSIS — M6281 Muscle weakness (generalized): Secondary | ICD-10-CM | POA: Diagnosis not present

## 2023-01-14 DIAGNOSIS — Z720 Tobacco use: Secondary | ICD-10-CM | POA: Diagnosis not present

## 2023-01-15 ENCOUNTER — Ambulatory Visit: Payer: 59 | Admitting: Nurse Practitioner

## 2023-01-15 DIAGNOSIS — R262 Difficulty in walking, not elsewhere classified: Secondary | ICD-10-CM | POA: Diagnosis not present

## 2023-01-15 DIAGNOSIS — M6281 Muscle weakness (generalized): Secondary | ICD-10-CM | POA: Diagnosis not present

## 2023-01-15 DIAGNOSIS — R1311 Dysphagia, oral phase: Secondary | ICD-10-CM | POA: Diagnosis not present

## 2023-01-15 DIAGNOSIS — N390 Urinary tract infection, site not specified: Secondary | ICD-10-CM | POA: Diagnosis not present

## 2023-01-16 ENCOUNTER — Telehealth: Payer: Self-pay | Admitting: Nurse Practitioner

## 2023-01-16 DIAGNOSIS — R262 Difficulty in walking, not elsewhere classified: Secondary | ICD-10-CM | POA: Diagnosis not present

## 2023-01-16 DIAGNOSIS — R1311 Dysphagia, oral phase: Secondary | ICD-10-CM | POA: Diagnosis not present

## 2023-01-16 DIAGNOSIS — M6281 Muscle weakness (generalized): Secondary | ICD-10-CM | POA: Diagnosis not present

## 2023-01-16 DIAGNOSIS — N390 Urinary tract infection, site not specified: Secondary | ICD-10-CM | POA: Diagnosis not present

## 2023-01-16 NOTE — Telephone Encounter (Signed)
Left vm to confirm 01/23/23 appointment-Toni

## 2023-01-17 DIAGNOSIS — M6281 Muscle weakness (generalized): Secondary | ICD-10-CM | POA: Diagnosis not present

## 2023-01-17 DIAGNOSIS — N390 Urinary tract infection, site not specified: Secondary | ICD-10-CM | POA: Diagnosis not present

## 2023-01-17 DIAGNOSIS — R1311 Dysphagia, oral phase: Secondary | ICD-10-CM | POA: Diagnosis not present

## 2023-01-17 DIAGNOSIS — R262 Difficulty in walking, not elsewhere classified: Secondary | ICD-10-CM | POA: Diagnosis not present

## 2023-01-18 DIAGNOSIS — I959 Hypotension, unspecified: Secondary | ICD-10-CM | POA: Diagnosis not present

## 2023-01-18 DIAGNOSIS — R1311 Dysphagia, oral phase: Secondary | ICD-10-CM | POA: Diagnosis not present

## 2023-01-18 DIAGNOSIS — M6281 Muscle weakness (generalized): Secondary | ICD-10-CM | POA: Diagnosis not present

## 2023-01-18 DIAGNOSIS — R262 Difficulty in walking, not elsewhere classified: Secondary | ICD-10-CM | POA: Diagnosis not present

## 2023-01-18 DIAGNOSIS — N390 Urinary tract infection, site not specified: Secondary | ICD-10-CM | POA: Diagnosis not present

## 2023-01-23 ENCOUNTER — Ambulatory Visit: Payer: 59 | Admitting: Nurse Practitioner

## 2023-01-24 DIAGNOSIS — M6281 Muscle weakness (generalized): Secondary | ICD-10-CM | POA: Diagnosis not present

## 2023-01-24 DIAGNOSIS — I959 Hypotension, unspecified: Secondary | ICD-10-CM | POA: Diagnosis not present

## 2023-01-30 DIAGNOSIS — I959 Hypotension, unspecified: Secondary | ICD-10-CM | POA: Diagnosis not present

## 2023-01-30 DIAGNOSIS — M6281 Muscle weakness (generalized): Secondary | ICD-10-CM | POA: Diagnosis not present

## 2023-01-30 DIAGNOSIS — R001 Bradycardia, unspecified: Secondary | ICD-10-CM | POA: Diagnosis not present

## 2023-02-04 DIAGNOSIS — R001 Bradycardia, unspecified: Secondary | ICD-10-CM | POA: Diagnosis not present

## 2023-02-04 DIAGNOSIS — Z720 Tobacco use: Secondary | ICD-10-CM | POA: Diagnosis not present

## 2023-02-04 DIAGNOSIS — C189 Malignant neoplasm of colon, unspecified: Secondary | ICD-10-CM | POA: Diagnosis not present

## 2023-02-04 DIAGNOSIS — I959 Hypotension, unspecified: Secondary | ICD-10-CM | POA: Diagnosis not present

## 2023-02-04 DIAGNOSIS — M6281 Muscle weakness (generalized): Secondary | ICD-10-CM | POA: Diagnosis not present

## 2023-02-07 DIAGNOSIS — I959 Hypotension, unspecified: Secondary | ICD-10-CM | POA: Diagnosis not present

## 2023-02-07 DIAGNOSIS — C189 Malignant neoplasm of colon, unspecified: Secondary | ICD-10-CM | POA: Diagnosis not present

## 2023-02-07 DIAGNOSIS — R001 Bradycardia, unspecified: Secondary | ICD-10-CM | POA: Diagnosis not present

## 2023-02-09 ENCOUNTER — Encounter: Payer: Self-pay | Admitting: Internal Medicine

## 2023-02-09 ENCOUNTER — Ambulatory Visit: Payer: 59 | Attending: Internal Medicine | Admitting: Internal Medicine

## 2023-02-09 VITALS — BP 108/70 | HR 75 | Ht 67.0 in | Wt 124.6 lb

## 2023-02-09 DIAGNOSIS — N390 Urinary tract infection, site not specified: Secondary | ICD-10-CM | POA: Diagnosis not present

## 2023-02-09 DIAGNOSIS — R739 Hyperglycemia, unspecified: Secondary | ICD-10-CM

## 2023-02-09 DIAGNOSIS — Z7689 Persons encountering health services in other specified circumstances: Secondary | ICD-10-CM | POA: Diagnosis not present

## 2023-02-09 DIAGNOSIS — R269 Unspecified abnormalities of gait and mobility: Secondary | ICD-10-CM | POA: Diagnosis not present

## 2023-02-09 DIAGNOSIS — Z2821 Immunization not carried out because of patient refusal: Secondary | ICD-10-CM

## 2023-02-09 DIAGNOSIS — F250 Schizoaffective disorder, bipolar type: Secondary | ICD-10-CM | POA: Diagnosis not present

## 2023-02-09 DIAGNOSIS — Z85038 Personal history of other malignant neoplasm of large intestine: Secondary | ICD-10-CM

## 2023-02-09 DIAGNOSIS — D509 Iron deficiency anemia, unspecified: Secondary | ICD-10-CM

## 2023-02-09 DIAGNOSIS — F17200 Nicotine dependence, unspecified, uncomplicated: Secondary | ICD-10-CM | POA: Diagnosis not present

## 2023-02-09 DIAGNOSIS — A419 Sepsis, unspecified organism: Secondary | ICD-10-CM | POA: Diagnosis not present

## 2023-02-09 LAB — POCT GLYCOSYLATED HEMOGLOBIN (HGB A1C): HbA1c POC (<> result, manual entry): 5.3 % (ref 4.0–5.6)

## 2023-02-09 LAB — GLUCOSE, POCT (MANUAL RESULT ENTRY): POC Glucose: 125 mg/dL — AB (ref 70–99)

## 2023-02-09 NOTE — Patient Instructions (Addendum)
Patient had refused flu vaccine, Shingrix and pneumonia vaccines.  Referral submitted for him to see the gastroenterologist.  He is due for repeat colonoscopy given his history of colon cancer.  A1c today was normal at 5.3.  Please make sure that patient is seen by psychiatrist who comes to your facility given his history of schizoaffective disorder.  He was receiving once a month injections of Invega for schizophrenia.

## 2023-02-09 NOTE — Progress Notes (Signed)
Patient ID: Dustin [name], male    DOB: 1955-08-16  MRN: [id number]  CC: New Patient (Initial Visit) (No questions or concerns)   Subjective: Dustin [name] is a 67 y.o. male who presents for new pt visit. Tre, aide from [facility] and [employer] [facility], is with him. His concerns today include:  Patient with history of fhx colon CA/polyps (referred for genetic testing by onc by never done), stage I adeno CA of colon/status post right hemicolectomy 10/2021, no adjuvant chemotherapy needed, tobacco dependence, iron deficiency, schizoaffective disorder bipolar type  Previous PCP was NP Alyssa Abernathy. She was in [place] but pt now permanently in a nursing home here in [place].  Patient was hospitalized in June of this year with sepsis secondary to UTI.  He was hypothermic and bradycardic.  Sepsis treated with antibiotics.  Seen by cardiologist.  Bradycardia improved and patient did not need pacemaker.  Seen by psychiatry while hospitalized.  They recommend that he continue Invega IM injection every 28 days.  He had persistent hypotension.  Started on midodrine.  BS were being monitored but no hx of DM. Also noted to have thrombocytopenia that was stable.  SNF was recommended.  He has remained at the current facility and aide tells me that he will be there permanently.  Today:  Patient is not able to give much of the history.  He is pleasantly confused and aide states that this is his baseline.  He makes reference to communicating with some computer that is attached to a vein at the front of his mouth.  He was able to correctly state the month but does not know the day of the week or the year. Paperwork from the nursing home shows that he is on Seroquel 25 mg twice a day, midodrine 2.5 mg daily and Zoloft 50 mg daily.  He denies hearing voices or seeing things that are not there.  Denies any dizziness, nausea, vomiting, abdominal pain He smokes cigarettes.  He is not interested in quitting. Looks  like he had his hemicolectomy in May of last year.  He is overdue for follow-up colonoscopy for surveillance purposes.  He has history of iron deficiency anemia.  Hemoglobin at the time of hospital discharge in June was 12.4.  He is not on iron supplement. Due for flu, Tdap, pneumonia and shingles vaccine.  He declines all vaccines. Patient Active Problem List   Diagnosis Date Noted   Bacteremia 11/12/2022   Sepsis secondary to UTI (HCC) 11/10/2022   Pressure injury of skin 11/10/2022   Protein-calorie malnutrition, severe 07/22/2022   COVID-19 virus infection 07/20/2022   Hypotension 07/20/2022   Aspiration pneumonia (HCC) 07/20/2022   Adenocarcinoma of colon (HCC) 12/13/2021   Malignant neoplasm of ascending colon (HCC) 11/08/2021   Family history of cancer 11/08/2021   IDA (iron deficiency anemia)    Colonic mass 10/06/2021   Microcytic anemia 10/05/2021   Syncope 10/05/2021   Symptomatic anemia    Schizoaffective disorder, bipolar type (HCC) 01/24/2017   Tobacco use disorder 11/29/2016     Current Outpatient Medications on File Prior to Visit  Medication Sig Dispense Refill   midodrine (PROAMATINE) 2.5 MG tablet Take 1 tablet (2.5 mg total) by mouth 3 (three) times daily with meals.     nicotine (NICODERM CQ - DOSED IN MG/24 HOURS) 14 mg/24hr patch Place 1 patch (14 mg total) onto the skin daily. 28 patch 0   QUEtiapine (SEROQUEL) 25 MG tablet Take 25 mg by mouth 2 (  two) times daily.     sertraline (ZOLOFT) 50 MG tablet Take 50 mg by mouth daily.     No current facility-administered medications on file prior to visit.    No Known Allergies  Social History   Socioeconomic History   Marital status: Single    Spouse name: Not on file   Number of children: Not on file   Years of education: Not on file   Highest education level: Not on file  Occupational History   Not on file  Tobacco Use   Smoking status: Every Day    Current packs/day: 0.50    Average packs/day: 0.5  packs/day for 15.0 years (7.5 ttl pk-yrs)    Types: Cigarettes   Smokeless tobacco: Never  Vaping Use   Vaping status: Never Used  Substance and Sexual Activity   Alcohol use: Not Currently   Drug use: No   Sexual activity: Never    Birth control/protection: None  Other Topics Concern   Not on file  Social History Narrative   Not on file   Social Determinants of Health   Financial Resource Strain: Not on file  Food Insecurity: Not on file  Transportation Needs: Not on file  Physical Activity: Not on file  Stress: Not on file  Social Connections: Not on file  Intimate Partner Violence: Not At Risk (07/21/2022)   Humiliation, Afraid, Rape, and Kick questionnaire    Fear of Current or Ex-Partner: No    Emotionally Abused: No    Physically Abused: No    Sexually Abused: No    Family History  Problem Relation Age of Onset   Lung cancer Father    Hypertension Brother    Hypertension Brother    Hypertension Brother    Prostate cancer Brother     Past Surgical History:  Procedure Laterality Date   COLON SURGERY     COLONOSCOPY N/A 10/06/2021   Procedure: COLONOSCOPY;  Surgeon: Toledo, Boykin Nearing, MD;  Location: [facility] ENDOSCOPY;  Service: Gastroenterology;  Laterality: N/A;   ESOPHAGOGASTRODUODENOSCOPY N/A 10/06/2021   Procedure: ESOPHAGOGASTRODUODENOSCOPY (EGD);  Surgeon: Toledo, Boykin Nearing, MD;  Location: [facility] ENDOSCOPY;  Service: Gastroenterology;  Laterality: N/A;   PARTIAL COLECTOMY  10/13/2021   Procedure: RIGHT COLECTOMY;  Surgeon: Leafy Ro, MD;  Location: [facility] ORS;  Service: General;;    ROS: Review of Systems Negative except as stated above  PHYSICAL EXAM: BP 108/70   Pulse 75   Ht 5\' 7"  (1.702 m)   Wt 124 lb 9.6 oz (56.5 kg)   SpO2 100%   BMI 19.52 kg/m   Physical Exam   General appearance -older African-American male sitting in wheelchair in NAD.  He constantly talks to himself. Mental status -pleasantly confused which aide tells me is his  baseline.  He follows commands. Mouth - mucous membranes moist, pharynx normal without lesions Neck - supple, no significant adenopathy Chest - clear to auscultation, no wheezes, rales or rhonchi, symmetric air entry Heart - normal rate, regular rhythm, normal S1, S2, no murmurs, rubs, clicks or gallops Abdomen - soft, nontender, nondistended, no masses or organomegaly Extremities - peripheral pulses normal, no pedal edema, no clubbing or cyanosis     Latest Ref Rng & Units 11/18/2022    1:06 AM 11/17/2022    5:33 AM 11/16/2022    5:22 AM  CMP  Glucose 70 - 99 mg/dL 95   74   BUN 8 - 23 mg/dL 12   15   Creatinine 1.61 -  1.24 mg/dL 9.14  7.82  9.56   Sodium 135 - 145 mmol/L 137   140   Potassium 3.5 - 5.1 mmol/L 4.1   4.0   Chloride 98 - 111 mmol/L 102   111   CO2 22 - 32 mmol/L 25   25   Calcium 8.9 - 10.3 mg/dL 9.1   8.9    Lipid Panel     Component Value Date/Time   CHOL 120 10/03/2021 1127   TRIG 61 10/03/2021 1127   HDL 64 10/03/2021 1127   CHOLHDL 1.9 10/03/2021 1127   CHOLHDL 2.9 07/13/2018 0619   VLDL 8 07/13/2018 0619   LDLCALC 43 10/03/2021 1127    CBC    Component Value Date/Time   WBC 4.3 11/18/2022 0106   RBC 4.64 11/18/2022 0106   HGB 12.4 (L) 11/18/2022 0106   HGB 6.2 (LL) 10/03/2021 1127   HCT 38.7 (L) 11/18/2022 0106   HCT 23.7 (L) 10/03/2021 1127   PLT 110 (L) 11/18/2022 0106   PLT 277 10/03/2021 1127   MCV 83.4 11/18/2022 0106   MCV 64 (L) 10/03/2021 1127   MCV 83 05/14/2013 1123   MCH 26.7 11/18/2022 0106   MCHC 32.0 11/18/2022 0106   RDW 19.9 (H) 11/18/2022 0106   RDW 19.2 (H) 10/03/2021 1127   RDW 13.9 05/14/2013 1123   LYMPHSABS 0.4 (L) 11/10/2022 1126   LYMPHSABS 0.9 10/03/2021 1127   LYMPHSABS 2.0 05/14/2013 1123   MONOABS 0.3 11/10/2022 1126   MONOABS 0.8 05/14/2013 1123   EOSABS 0.0 11/10/2022 1126   EOSABS 0.0 10/03/2021 1127   EOSABS 0.1 05/14/2013 1123   BASOSABS 0.0 11/10/2022 1126   BASOSABS 0.0 10/03/2021 1127   BASOSABS 0.0  05/14/2013 1123   Results for orders placed or performed in visit on 02/09/23  POCT glycosylated hemoglobin (Hb A1C)  Result Value Ref Range   Hemoglobin A1C     HbA1c POC (<> result, manual entry) 5.3 4.0 - 5.6 %   HbA1c, POC (prediabetic range)     HbA1c, POC (controlled diabetic range)    POCT glucose (manual entry)  Result Value Ref Range   POC Glucose 125 (A) 70 - 99 mg/dl     ASSESSMENT AND PLAN:  1. Establishing care with new doctor, encounter for   2. Schizoaffective disorder, bipolar type (HCC) He was told by the aide who is with him that the nursing home does have a psychiatrist who comes there twice a week.  I wrote on the information sent back to the nursing home advising that they have the psychiatrist who comes there follow him for his psychiatric disorder.  He may need to be back on Invega injections. Continue Seroquel and Zoloft. - Comprehensive metabolic panel  3. Tobacco dependence Advised to quit.  Patient not interested in doing so.  4. History of colon cancer, stage I Looks like he is overdue for repeat colonoscopy for surveillance.  Will refer to gastroenterology. - Ambulatory referral to Gastroenterology - Comprehensive metabolic panel  5. Hyperglycemia A1c normal. - POCT glycosylated hemoglobin (Hb A1C) - POCT glucose (manual entry)  6. Influenza vaccination declined Recommended.  Patient declined.  7. Pneumococcal vaccination declined Recommended.  Patient declined.  8. Iron deficiency anemia, unspecified iron deficiency anemia type - CBC   Patient was given the opportunity to ask questions.  Patient verbalized understanding of the plan and was able to repeat key elements of the plan.   This documentation was completed using Paediatric nurse.  Any transcriptional errors are unintentional.  Orders Placed This Encounter  Procedures   CBC   Comprehensive metabolic panel   Ambulatory referral to Gastroenterology   POCT  glycosylated hemoglobin (Hb A1C)   POCT glucose (manual entry)     Requested Prescriptions    No prescriptions requested or ordered in this encounter    Return in about 4 months (around 06/11/2023).  Jonah Blue, MD, FACP

## 2023-02-11 LAB — COMPREHENSIVE METABOLIC PANEL
ALT: 8 IU/L (ref 0–44)
AST: 13 IU/L (ref 0–40)
Albumin: 3.7 g/dL — ABNORMAL LOW (ref 3.9–4.9)
Alkaline Phosphatase: 118 IU/L (ref 44–121)
BUN/Creatinine Ratio: 14 (ref 10–24)
BUN: 14 mg/dL (ref 8–27)
Bilirubin Total: 0.2 mg/dL (ref 0.0–1.2)
CO2: 23 mmol/L (ref 20–29)
Calcium: 9.5 mg/dL (ref 8.6–10.2)
Chloride: 105 mmol/L (ref 96–106)
Creatinine, Ser: 0.99 mg/dL (ref 0.76–1.27)
Globulin, Total: 2.9 g/dL (ref 1.5–4.5)
Glucose: 98 mg/dL (ref 70–99)
Potassium: 4.1 mmol/L (ref 3.5–5.2)
Sodium: 139 mmol/L (ref 134–144)
Total Protein: 6.6 g/dL (ref 6.0–8.5)
eGFR: 84 mL/min/{1.73_m2} (ref >59)

## 2023-02-11 LAB — CBC
Hematocrit: 40.2 % (ref 37.5–51.0)
Hemoglobin: 13 g/dL (ref 13.0–17.7)
MCH: 27.8 pg (ref 26.6–33.0)
MCHC: 32.3 g/dL (ref 31.5–35.7)
MCV: 86 fL (ref 79–97)
Platelets: 103 10*3/uL — ABNORMAL LOW (ref 150–450)
RBC: 4.68 x10E6/uL (ref 4.14–5.80)
RDW: 16.7 % — ABNORMAL HIGH (ref 11.6–15.4)
WBC: 5.6 10*3/uL (ref 3.4–10.8)

## 2023-02-13 ENCOUNTER — Ambulatory Visit: Payer: 59 | Admitting: Nurse Practitioner

## 2023-02-26 ENCOUNTER — Emergency Department (HOSPITAL_COMMUNITY): Payer: 59

## 2023-02-26 ENCOUNTER — Encounter (HOSPITAL_COMMUNITY): Payer: Self-pay

## 2023-02-26 ENCOUNTER — Inpatient Hospital Stay (HOSPITAL_COMMUNITY)
Admission: EM | Admit: 2023-02-26 | Discharge: 2023-03-02 | DRG: 689 | Disposition: A | Discharge status: Skilled Nursing Facility | Payer: 59 | Source: Skilled Nursing Facility | Attending: Internal Medicine | Admitting: Internal Medicine

## 2023-02-26 ENCOUNTER — Other Ambulatory Visit: Payer: Self-pay

## 2023-02-26 DIAGNOSIS — R4689 Other symptoms and signs involving appearance and behavior: Secondary | ICD-10-CM

## 2023-02-26 DIAGNOSIS — J439 Emphysema, unspecified: Secondary | ICD-10-CM | POA: Diagnosis present

## 2023-02-26 DIAGNOSIS — I10 Essential (primary) hypertension: Secondary | ICD-10-CM | POA: Diagnosis present

## 2023-02-26 DIAGNOSIS — T68XXXA Hypothermia, initial encounter: Secondary | ICD-10-CM

## 2023-02-26 DIAGNOSIS — R531 Weakness: Secondary | ICD-10-CM | POA: Diagnosis not present

## 2023-02-26 DIAGNOSIS — F17200 Nicotine dependence, unspecified, uncomplicated: Secondary | ICD-10-CM | POA: Diagnosis present

## 2023-02-26 DIAGNOSIS — N390 Urinary tract infection, site not specified: Principal | ICD-10-CM

## 2023-02-26 DIAGNOSIS — I495 Sick sinus syndrome: Secondary | ICD-10-CM | POA: Diagnosis not present

## 2023-02-26 DIAGNOSIS — F068 Other specified mental disorders due to known physiological condition: Secondary | ICD-10-CM | POA: Diagnosis not present

## 2023-02-26 DIAGNOSIS — R001 Bradycardia, unspecified: Secondary | ICD-10-CM | POA: Diagnosis present

## 2023-02-26 DIAGNOSIS — I440 Atrioventricular block, first degree: Secondary | ICD-10-CM | POA: Diagnosis present

## 2023-02-26 DIAGNOSIS — Z8042 Family history of malignant neoplasm of prostate: Secondary | ICD-10-CM | POA: Diagnosis not present

## 2023-02-26 DIAGNOSIS — I959 Hypotension, unspecified: Secondary | ICD-10-CM | POA: Diagnosis present

## 2023-02-26 DIAGNOSIS — Z85038 Personal history of other malignant neoplasm of large intestine: Secondary | ICD-10-CM

## 2023-02-26 DIAGNOSIS — F209 Schizophrenia, unspecified: Secondary | ICD-10-CM | POA: Diagnosis present

## 2023-02-26 DIAGNOSIS — F250 Schizoaffective disorder, bipolar type: Secondary | ICD-10-CM | POA: Diagnosis present

## 2023-02-26 DIAGNOSIS — G9341 Metabolic encephalopathy: Secondary | ICD-10-CM

## 2023-02-26 DIAGNOSIS — R404 Transient alteration of awareness: Secondary | ICD-10-CM | POA: Diagnosis not present

## 2023-02-26 DIAGNOSIS — Z79899 Other long term (current) drug therapy: Secondary | ICD-10-CM

## 2023-02-26 DIAGNOSIS — R54 Age-related physical debility: Secondary | ICD-10-CM | POA: Diagnosis present

## 2023-02-26 DIAGNOSIS — A419 Sepsis, unspecified organism: Secondary | ICD-10-CM | POA: Diagnosis not present

## 2023-02-26 DIAGNOSIS — F17210 Nicotine dependence, cigarettes, uncomplicated: Secondary | ICD-10-CM | POA: Diagnosis present

## 2023-02-26 DIAGNOSIS — Z681 Body mass index (BMI) 19 or less, adult: Secondary | ICD-10-CM

## 2023-02-26 DIAGNOSIS — B9620 Unspecified Escherichia coli [E. coli] as the cause of diseases classified elsewhere: Secondary | ICD-10-CM | POA: Diagnosis present

## 2023-02-26 DIAGNOSIS — G9340 Encephalopathy, unspecified: Secondary | ICD-10-CM | POA: Diagnosis not present

## 2023-02-26 DIAGNOSIS — Z7401 Bed confinement status: Secondary | ICD-10-CM | POA: Diagnosis not present

## 2023-02-26 DIAGNOSIS — I499 Cardiac arrhythmia, unspecified: Secondary | ICD-10-CM | POA: Diagnosis not present

## 2023-02-26 DIAGNOSIS — R680 Hypothermia, not associated with low environmental temperature: Secondary | ICD-10-CM | POA: Diagnosis present

## 2023-02-26 DIAGNOSIS — Z743 Need for continuous supervision: Secondary | ICD-10-CM | POA: Diagnosis not present

## 2023-02-26 DIAGNOSIS — Z8249 Family history of ischemic heart disease and other diseases of the circulatory system: Secondary | ICD-10-CM

## 2023-02-26 DIAGNOSIS — Z801 Family history of malignant neoplasm of trachea, bronchus and lung: Secondary | ICD-10-CM

## 2023-02-26 DIAGNOSIS — R4182 Altered mental status, unspecified: Secondary | ICD-10-CM

## 2023-02-26 DIAGNOSIS — E441 Mild protein-calorie malnutrition: Secondary | ICD-10-CM | POA: Diagnosis present

## 2023-02-26 DIAGNOSIS — F05 Delirium due to known physiological condition: Secondary | ICD-10-CM | POA: Diagnosis present

## 2023-02-26 DIAGNOSIS — J40 Bronchitis, not specified as acute or chronic: Secondary | ICD-10-CM | POA: Diagnosis not present

## 2023-02-26 DIAGNOSIS — R918 Other nonspecific abnormal finding of lung field: Secondary | ICD-10-CM | POA: Diagnosis not present

## 2023-02-26 DIAGNOSIS — F29 Unspecified psychosis not due to a substance or known physiological condition: Secondary | ICD-10-CM | POA: Diagnosis present

## 2023-02-26 LAB — COMPREHENSIVE METABOLIC PANEL
ALT: 13 U/L (ref 0–44)
AST: 17 U/L (ref 15–41)
Albumin: 3.1 g/dL — ABNORMAL LOW (ref 3.5–5.0)
Alkaline Phosphatase: 91 U/L (ref 38–126)
Anion gap: 7 (ref 5–15)
BUN: 10 mg/dL (ref 8–23)
CO2: 26 mmol/L (ref 22–32)
Calcium: 9.2 mg/dL (ref 8.9–10.3)
Chloride: 105 mmol/L (ref 98–111)
Creatinine, Ser: 0.99 mg/dL (ref 0.61–1.24)
GFR, Estimated: >60 mL/min (ref >60)
Glucose, Bld: 111 mg/dL — ABNORMAL HIGH (ref 70–99)
Potassium: 3.9 mmol/L (ref 3.5–5.1)
Sodium: 138 mmol/L (ref 135–145)
Total Bilirubin: 0.4 mg/dL (ref 0.3–1.2)
Total Protein: 6.4 g/dL — ABNORMAL LOW (ref 6.5–8.1)

## 2023-02-26 LAB — CBC WITH DIFFERENTIAL/PLATELET
Abs Immature Granulocytes: 0.02 10*3/uL (ref 0.00–0.07)
Basophils Absolute: 0 10*3/uL (ref 0.0–0.1)
Basophils Relative: 0 %
Eosinophils Absolute: 0.1 10*3/uL (ref 0.0–0.5)
Eosinophils Relative: 2 %
HCT: 37.7 % — ABNORMAL LOW (ref 39.0–52.0)
Hemoglobin: 11.6 g/dL — ABNORMAL LOW (ref 13.0–17.0)
Immature Granulocytes: 1 %
Lymphocytes Relative: 19 %
Lymphs Abs: 0.8 10*3/uL (ref 0.7–4.0)
MCH: 27.7 pg (ref 26.0–34.0)
MCHC: 30.8 g/dL (ref 30.0–36.0)
MCV: 90 fL (ref 80.0–100.0)
Monocytes Absolute: 0.5 10*3/uL (ref 0.1–1.0)
Monocytes Relative: 11 %
Neutro Abs: 2.9 10*3/uL (ref 1.7–7.7)
Neutrophils Relative %: 67 %
Platelets: 181 10*3/uL (ref 150–400)
RBC: 4.19 MIL/uL — ABNORMAL LOW (ref 4.22–5.81)
RDW: 16.7 % — ABNORMAL HIGH (ref 11.5–15.5)
WBC: 4.3 10*3/uL (ref 4.0–10.5)
nRBC: 0 % (ref 0.0–0.2)

## 2023-02-26 LAB — URINALYSIS, W/ REFLEX TO CULTURE (INFECTION SUSPECTED)
Bilirubin Urine: NEGATIVE
Glucose, UA: NEGATIVE mg/dL
Ketones, ur: NEGATIVE mg/dL
Nitrite: POSITIVE — AB
Protein, ur: NEGATIVE mg/dL
Specific Gravity, Urine: 1.013 (ref 1.005–1.030)
WBC, UA: >50 WBC/hpf (ref 0–5)
pH: 5 (ref 5.0–8.0)

## 2023-02-26 LAB — TSH: TSH: 0.89 u[IU]/mL (ref 0.350–4.500)

## 2023-02-26 LAB — I-STAT CG4 LACTIC ACID, ED: Lactic Acid, Venous: 1.3 mmol/L (ref 0.5–1.9)

## 2023-02-26 LAB — MAGNESIUM: Magnesium: 1.9 mg/dL (ref 1.7–2.4)

## 2023-02-26 MED ORDER — QUETIAPINE FUMARATE 25 MG PO TABS
25.0000 mg | ORAL_TABLET | Freq: Two times a day (BID) | ORAL | Status: DC
  Administered 2023-02-27: 25 mg via ORAL
  Filled 2023-02-26: qty 1

## 2023-02-26 MED ORDER — SODIUM CHLORIDE 0.9 % IV SOLN
1.0000 g | INTRAVENOUS | Status: DC
  Administered 2023-02-27 – 2023-02-28 (×2): 1 g via INTRAVENOUS
  Filled 2023-02-26 (×2): qty 10

## 2023-02-26 MED ORDER — SENNOSIDES-DOCUSATE SODIUM 8.6-50 MG PO TABS: 1.0000 | ORAL_TABLET | Freq: Every evening | ORAL | Status: DC | PRN

## 2023-02-26 MED ORDER — ENOXAPARIN SODIUM 40 MG/0.4ML IJ SOSY
40.0000 mg | PREFILLED_SYRINGE | INTRAMUSCULAR | Status: DC
  Filled 2023-02-26 (×2): qty 0.4

## 2023-02-26 MED ORDER — ONDANSETRON HCL 4 MG/2ML IJ SOLN: 4.0000 mg | Freq: Four times a day (QID) | INTRAMUSCULAR | Status: DC | PRN

## 2023-02-26 MED ORDER — SERTRALINE HCL 50 MG PO TABS
50.0000 mg | ORAL_TABLET | Freq: Every day | ORAL | Status: DC
  Administered 2023-02-28 – 2023-03-01 (×2): 50 mg via ORAL
  Filled 2023-02-26 (×3): qty 1

## 2023-02-26 MED ORDER — MIDODRINE HCL 5 MG PO TABS
2.5000 mg | ORAL_TABLET | Freq: Three times a day (TID) | ORAL | Status: DC
  Administered 2023-02-27 – 2023-03-01 (×5): 2.5 mg via ORAL
  Filled 2023-02-26 (×8): qty 1

## 2023-02-26 MED ORDER — HALOPERIDOL LACTATE 5 MG/ML IJ SOLN
5.0000 mg | Freq: Once | INTRAMUSCULAR | Status: AC
  Administered 2023-02-26: 5 mg via INTRAVENOUS
  Filled 2023-02-26: qty 1

## 2023-02-26 MED ORDER — SODIUM CHLORIDE 0.9 % IV SOLN
2.0000 g | Freq: Once | INTRAVENOUS | Status: AC
  Administered 2023-02-26: 2 g via INTRAVENOUS
  Filled 2023-02-26: qty 20

## 2023-02-26 MED ORDER — ACETAMINOPHEN 650 MG RE SUPP: 650.0000 mg | Freq: Four times a day (QID) | RECTAL | Status: DC | PRN

## 2023-02-26 MED ORDER — ACETAMINOPHEN 325 MG PO TABS: 650.0000 mg | ORAL_TABLET | Freq: Four times a day (QID) | ORAL | Status: DC | PRN

## 2023-02-26 MED ORDER — HALOPERIDOL LACTATE 5 MG/ML IJ SOLN
5.0000 mg | Freq: Four times a day (QID) | INTRAMUSCULAR | Status: DC | PRN
  Administered 2023-02-27: 5 mg via INTRAVENOUS
  Filled 2023-02-26: qty 1

## 2023-02-26 MED ORDER — SODIUM CHLORIDE 0.9 % IV SOLN
100.0000 mg | Freq: Two times a day (BID) | INTRAVENOUS | Status: DC
  Administered 2023-02-26 – 2023-02-28 (×4): 100 mg via INTRAVENOUS
  Filled 2023-02-26 (×7): qty 100

## 2023-02-26 MED ORDER — NICOTINE 14 MG/24HR TD PT24
14.0000 mg | MEDICATED_PATCH | Freq: Every day | TRANSDERMAL | Status: DC
  Administered 2023-02-26: 14 mg via TRANSDERMAL
  Filled 2023-02-26 (×2): qty 1

## 2023-02-26 MED ORDER — ONDANSETRON HCL 4 MG PO TABS: 4.0000 mg | ORAL_TABLET | Freq: Four times a day (QID) | ORAL | Status: DC | PRN

## 2023-02-26 NOTE — ED Notes (Signed)
Warming blanket applied.
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
## 2023-02-26 NOTE — ED Triage Notes (Addendum)
[facility] called out to [facility] for IVC due to paranoia hx schizophrenia.  Patient having ams and reports he takes all his meds.  Strong smell of urine.  A&Ox3  patient is very uncooperative on scene with ems.  Patient continues to be combative and will not allow anyone to do anything. Versed 5mg  IM given by EMS

## 2023-02-26 NOTE — Consult Note (Addendum)
Cardiology Consultation   Patient ID: Brentan Mutch Stick MRN: [id number]; DOB: [birth year]  Admit date: 02/26/2023 Date of Consult: 02/26/2023  PCP:  Marcine Matar, MD   [facility] Providers Cardiologist:  None        Patient Profile:   Izaeah Hlavac Ault is a 67 y.o. male with a hx of schizoaffective disorder who is being seen 02/26/2023 for the evaluation of bradycardia at the request of Crosley, Debby.  History of Present Illness:   Mr. Salb presented with altered mental status and was reportedly combative per ED note and ultimately received sedation (documented admin of IV haldol, although ED note mentions IM versed as well). Patient was evaluated by our medicine colleagues who requested an evaluation for bradycardia. ECG in the ED illustrated sinus bradycardia in the 30's.  History limited by patient's altered mental status. Patient denies a history of heart disease, presyncope, syncope, LE edema, palpitations, chest pain, orthopnea, or PND.   Past Medical History:  Diagnosis Date   Schizophrenia [facility])     Past Surgical History:  Procedure Laterality Date   COLON SURGERY     COLONOSCOPY N/A 10/06/2021   Procedure: COLONOSCOPY;  Surgeon: Toledo, Boykin Nearing, MD;  Location: [facility] ENDOSCOPY;  Service: Gastroenterology;  Laterality: N/A;   ESOPHAGOGASTRODUODENOSCOPY N/A 10/06/2021   Procedure: ESOPHAGOGASTRODUODENOSCOPY (EGD);  Surgeon: Toledo, Boykin Nearing, MD;  Location: [facility] ENDOSCOPY;  Service: Gastroenterology;  Laterality: N/A;   PARTIAL COLECTOMY  10/13/2021   Procedure: RIGHT COLECTOMY;  Surgeon: Leafy Ro, MD;  Location: [facility] ORS;  Service: General;;     Home Medications:  Prior to Admission medications   Medication Sig Start Date End Date Taking? Authorizing Provider  midodrine (PROAMATINE) 2.5 MG tablet Take 1 tablet (2.5 mg total) by mouth 3 (three) times daily with meals. 11/23/22   Burnadette Pop, MD  nicotine (NICODERM CQ - DOSED IN MG/24  HOURS) 14 mg/24hr patch Place 1 patch (14 mg total) onto the skin daily. 11/23/22   Burnadette Pop, MD  QUEtiapine (SEROQUEL) 25 MG tablet Take 25 mg by mouth 2 (two) times daily. 02/01/23   [provider]  sertraline (ZOLOFT) 50 MG tablet Take 50 mg by mouth daily. 02/03/23   [provider]    Inpatient Medications: Scheduled Meds:  nicotine  14 mg Transdermal Daily   Continuous Infusions:  [START ON 02/27/2023] cefTRIAXone (ROCEPHIN)  IV     doxycycline (VIBRAMYCIN) IV     PRN Meds: haloperidol lactate  Allergies:   No Known Allergies  Social History:   Social History   Socioeconomic History   Marital status: Single    Spouse name: Not on file   Number of children: Not on file   Years of education: Not on file   Highest education level: Not on file  Occupational History   Not on file  Tobacco Use   Smoking status: Every Day    Current packs/day: 0.50    Average packs/day: 0.5 packs/day for 15.0 years (7.5 ttl pk-yrs)    Types: Cigarettes   Smokeless tobacco: Never  Vaping Use   Vaping status: Never Used  Substance and Sexual Activity   Alcohol use: Not Currently   Drug use: No   Sexual activity: Never    Birth control/protection: None  Other Topics Concern   Not on file  Social History Narrative   Not on file   Social Determinants of Health   Financial Resource Strain: Not on file  Food Insecurity: Not on  file  Transportation Needs: Not on file  Physical Activity: Not on file  Stress: Not on file  Social Connections: Not on file  Intimate Partner Violence: Not At Risk (07/21/2022)   Humiliation, Afraid, Rape, and Kick questionnaire    Fear of Current or Ex-Partner: No    Emotionally Abused: No    Physically Abused: No    Sexually Abused: No    Family History:    Family History  Problem Relation Age of Onset   Lung cancer Father    Hypertension Brother    Hypertension Brother    Hypertension Brother    Prostate cancer Brother       ROS:  Please see the history of present illness.   All other ROS reviewed and negative.     Physical Exam/Data:   Vitals:   02/26/23 1900 02/26/23 2000 02/26/23 2025 02/26/23 2100  BP: 131/84 127/77  116/82  Pulse: (!) 40 (!) 44  (!) 48  Resp: (!) 9 (!) 9  (!) 9  Temp:   (!) 92.6 F (33.7 C)   TempSrc:   Rectal   SpO2: 100% 99%  100%  Weight:      Height:        Intake/Output Summary (Last 24 hours) at 02/26/2023 2146 Last data filed at 02/26/2023 2059 Gross per 24 hour  Intake 97.08 ml  Output --  Net 97.08 ml      02/26/2023    5:47 PM 02/09/2023    8:58 AM 11/17/2022    4:05 AM  Last 3 Weights  Weight (lbs) 124 lb 124 lb 9.6 oz 145 lb 8.1 oz  Weight (kg) 56.246 kg 56.518 kg 66 kg     Body mass index is 19.42 kg/m.  General:  Disheveled appearance, covered in blankets HEENT: normal Neck: no JVD Vascular: No carotid bruits; Distal pulses 2+ bilaterally Cardiac:  regular bradycardia in the 40's that increased to the 60's upon waking Lungs:  symmetric chest rise, no increased work of breathing Abd: non-distended, non-tender Ext: no edema Musculoskeletal:  No deformities, BUE and BLE strength normal and equal Skin: warm and dry  Neuro:  CNs 2-12 intact, no focal abnormalities noted Psych:  Agitated, flight of ideas  EKG:  The EKG was personally reviewed and demonstrates:  Sinus bradycardia with 1st degree AV block Telemetry:  Telemetry was personally reviewed and demonstrates:  sinus bradycardia  Relevant CV Studies: Echo 11/13/22   1. Left ventricular ejection fraction, by estimation, is 55 to 60%. The  left ventricle has normal function. The left ventricle has no regional  wall motion abnormalities. Left ventricular diastolic parameters were  normal.   2. Right ventricular systolic function is normal. The right ventricular  size is normal. There is normal pulmonary artery systolic pressure.   3. Left atrial size was moderately dilated.   4. Right atrial  size was moderately dilated.   5. The mitral valve is normal in structure. No evidence of mitral valve  regurgitation. No evidence of mitral stenosis.   6. The tricuspid valve is abnormal.   7. The aortic valve is tricuspid. Aortic valve regurgitation is not  visualized. No aortic stenosis is present.   8. The inferior vena cava is dilated in size with >50% respiratory  variability, suggesting right atrial pressure of 8 mmHg.   Laboratory Data:  High Sensitivity Troponin:  No results for input(s): "TROPONINIHS" in the last 720 hours.   Chemistry Recent Labs  Lab 02/26/23 1828  NA 138  K 3.9  CL 105  CO2 26  GLUCOSE 111*  BUN 10  CREATININE 0.99  CALCIUM 9.2  MG 1.9  GFRNONAA >60  ANIONGAP 7    Recent Labs  Lab 02/26/23 1828  PROT 6.4*  ALBUMIN 3.1*  AST 17  ALT 13  ALKPHOS 91  BILITOT 0.4   Lipids No results for input(s): "CHOL", "TRIG", "HDL", "LABVLDL", "LDLCALC", "CHOLHDL" in the last 168 hours.  Hematology Recent Labs  Lab 02/26/23 1828  WBC 4.3  RBC 4.19*  HGB 11.6*  HCT 37.7*  MCV 90.0  MCH 27.7  MCHC 30.8  RDW 16.7*  PLT 181   Thyroid  Recent Labs  Lab 02/26/23 1829  TSH 0.890    BNPNo results for input(s): "BNP", "PROBNP" in the last 168 hours.  DDimer No results for input(s): "DDIMER" in the last 168 hours.   Radiology/Studies:  DG Chest Portable 1 View  Result Date: 02/26/2023 CLINICAL DATA:  Altered mental status EXAM: PORTABLE CHEST 1 VIEW COMPARISON:  11/10/2022 FINDINGS: Marked patient rotation. Emphysema and diffuse bronchitic changes. Slight increased interstitial and ground-glass opacity in the left mid lung and right base, difficult to exclude acute superimposed process. No pneumothorax IMPRESSION: Emphysema and bronchitic changes. Slight increased interstitial and ground-glass opacity in the left mid lung and right base, difficult to exclude acute superimposed process/pneumonia. Electronically Signed   By: Jasmine Pang M.D.   On:  02/26/2023 20:34     Assessment and Plan:   Sinus bradycardia - In the setting of presentations for acute on chronic exacerbations of schizoaffective disorder with administration of sedatives. He was also extremely hypothermia to 92.6 deg F on arrival. No history of presyncope or syncope suggesting patient is symptomatic. He has a recent Echo with normal structure and function. Normal TSH. Reassured by immediate rise in HR to 60 when he woke up and spoke with me. ECG's dating back to 2020 show sinus bradycardia. Today his ECG also shows sinus bradycardia and 1st degree AV block with Earl Gala waves (hypothermia related changes). Suspect this is all medication related in addition to hypothermia and he has no indication for additional therapies or pacemaker at this time.   Risk Assessment/Risk Scores:            [facility] will sign off.   Medication Recommendations:  None Other recommendations (labs, testing, etc):  None Follow up as an outpatient:  None  For questions or updates, please contact [facility] Please consult [email or web address] for contact info under    Signed, Roderic Palau, MD  02/26/2023 9:46 PM

## 2023-02-26 NOTE — H&P (Signed)
PCP:   Marcine Matar, MD   Chief Complaint:  Altered mentation  HPI: This is a 67 year old male with past medical history of schizoaffective, bipolar disorder, chronic hypertension, tobacco use.  Patient sent to the ER for increasingly combative behavior.  Patient given Versed in the field.  Patient recently admitted 6/7-6/20 with sepsis secondary to UTI, hypothermic and bradycardia.  In the ER presenting vitals 113/65,HR 43, RR14, temp 93.3.  Bradycardia persisted.  Patient initially somnolent likely secondary to Versed.  More cognizant, given Haldol.  UA consistent with infection.  CXR suggestive of pneumonia.  EKG sinus bradycardia HR 34, QTc normal.  Blood cultures x 2, urine culture collected.  Rocephin ordered.  Review of Systems:  Unable to obtain due to patient's mentation  Past Medical History: Past Medical History:  Diagnosis Date   Schizophrenia [facility])    Past Surgical History:  Procedure Laterality Date   COLON SURGERY     COLONOSCOPY N/A 10/06/2021   Procedure: COLONOSCOPY;  Surgeon: Toledo, Boykin Nearing, MD;  Location: [facility] ENDOSCOPY;  Service: Gastroenterology;  Laterality: N/A;   ESOPHAGOGASTRODUODENOSCOPY N/A 10/06/2021   Procedure: ESOPHAGOGASTRODUODENOSCOPY (EGD);  Surgeon: Toledo, Boykin Nearing, MD;  Location: [facility] ENDOSCOPY;  Service: Gastroenterology;  Laterality: N/A;   PARTIAL COLECTOMY  10/13/2021   Procedure: RIGHT COLECTOMY;  Surgeon: Leafy Ro, MD;  Location: [facility] ORS;  Service: General;;    Medications: Prior to Admission medications   Medication Sig Start Date End Date Taking? Authorizing Provider  midodrine (PROAMATINE) 2.5 MG tablet Take 1 tablet (2.5 mg total) by mouth 3 (three) times daily with meals. 11/23/22   Burnadette Pop, MD  nicotine (NICODERM CQ - DOSED IN MG/24 HOURS) 14 mg/24hr patch Place 1 patch (14 mg total) onto the skin daily. 11/23/22   Burnadette Pop, MD  QUEtiapine (SEROQUEL) 25 MG tablet Take 25 mg by mouth 2 (two) times  daily. 02/01/23   [provider]  sertraline (ZOLOFT) 50 MG tablet Take 50 mg by mouth daily. 02/03/23   [provider]    Allergies:  No Known Allergies  Social History:  reports that he has been smoking cigarettes. He has a 7.5 pack-year smoking history. He has never used smokeless tobacco. He reports that he does not currently use alcohol. He reports that he does not use drugs.  Family History: Family History  Problem Relation Age of Onset   Lung cancer Father    Hypertension Brother    Hypertension Brother    Hypertension Brother    Prostate cancer Brother     Physical Exam: Vitals:   02/26/23 1748 02/26/23 1800 02/26/23 1900 02/26/23 2000  BP: 113/65  131/84 127/77  Pulse: (!) 43  (!) 40 (!) 44  Resp: 14  (!) 9 (!) 9  Temp:  (!) 93.3 F (34.1 C)    TempSrc:  Rectal    SpO2: 99%  100% 99%  Weight:      Height:        General: Disoriented male, well developed and nourished, no acute distress.  Bear blanket in place Eyes: Pink conjunctiva, no scleral icterus ENT: Moist oral mucosa, neck supple, no thyromegaly Lungs: CTA B/L, no wheeze, no crackles, no use of accessory muscles Cardiovascular: Bradycardia, RRR, no regurgitation, no gallops, no murmurs.  Abdomen: soft, positive BS, NTND, no organomegaly, not an acute abdomen GU: not examined Neuro: CN II - XII grossly intact, sensation intact Musculoskeletal: strength 5/5 moves all extremities equally.  No edema Skin: no rash, no  subcutaneous crepitation, no decubitus Psych: Disorganized, paranoid conversation  Labs on Admission:  Recent Labs    02/26/23 1828  NA 138  K 3.9  CL 105  CO2 26  GLUCOSE 111*  BUN 10  CREATININE 0.99  CALCIUM 9.2  MG 1.9   Recent Labs    02/26/23 1828  AST 17  ALT 13  ALKPHOS 91  BILITOT 0.4  PROT 6.4*  ALBUMIN 3.1*    Recent Labs    02/26/23 1828  WBC 4.3  NEUTROABS 2.9  HGB 11.6*  HCT 37.7*  MCV 90.0  PLT 181    Recent Labs     02/26/23 1829  TSH 0.890    Radiological Exams on Admission: DG Chest Portable 1 View  Result Date: 02/26/2023 CLINICAL DATA:  Altered mental status EXAM: PORTABLE CHEST 1 VIEW COMPARISON:  11/10/2022 FINDINGS: Marked patient rotation. Emphysema and diffuse bronchitic changes. Slight increased interstitial and ground-glass opacity in the left mid lung and right base, difficult to exclude acute superimposed process. No pneumothorax IMPRESSION: Emphysema and bronchitic changes. Slight increased interstitial and ground-glass opacity in the left mid lung and right base, difficult to exclude acute superimposed process/pneumonia. Electronically Signed   By: Jasmine Pang M.D.   On: 02/26/2023 20:34    Assessment/Plan Present on Admission:  Sepsis secondary to UTI (HCC)//  Bradycardia //  Hypothermia -Urine and blood cultures ordered and collected -IV Rocephin ordered -Bear hugger in place -Cardiology consulted for bradycardia. -Heart rate improving 55.  Repeat EKG ordered.  TSH normal.     Possible pneumonia -IV doxy added -Legionella pneumonia and strep pneumonia urinary antigens ordered -Nebulizers as needed   Altered mental status vs decompensated schiz, bipolar d/o -Haldol and Ativan PRN ordered -psych/BH consult placed -Seroquel and zoloft resumed -continue restraint - mitts -?sitter may be needed.  Will reassess   Tobacco use disorder -Nicotine patch ordered   History of colon cancer  Leida Luton 02/26/2023, 9:38 PM

## 2023-02-26 NOTE — ED Provider Notes (Addendum)
[place] EMERGENCY DEPARTMENT AT [facility] Provider Note   CSN: [id number] Arrival date & time: 02/26/23  1743     History  Chief Complaint  Patient presents with   Altered Mental Status    Dustin [name] is a 67 y.o. male.   Altered Mental Status Patient brought in  for mental status change.  Reportedly had history of schizophrenia.  EMS and was called.  Had been reportedly combative and swinging.  Reportedly alert and oriented but received 5 of IM Versed and now decreased on symptoms.  Bradycardic.  Does smell of urine.    Past Medical History:  Diagnosis Date   Schizophrenia (HCC)     Home Medications Prior to Admission medications   Medication Sig Start Date End Date Taking? Authorizing Provider  midodrine (PROAMATINE) 2.5 MG tablet Take 1 tablet (2.5 mg total) by mouth 3 (three) times daily with meals. 11/23/22   Burnadette Pop, MD  nicotine (NICODERM CQ - DOSED IN MG/24 HOURS) 14 mg/24hr patch Place 1 patch (14 mg total) onto the skin daily. 11/23/22   Burnadette Pop, MD  QUEtiapine (SEROQUEL) 25 MG tablet Take 25 mg by mouth 2 (two) times daily. 02/01/23   [provider]  sertraline (ZOLOFT) 50 MG tablet Take 50 mg by mouth daily. 02/03/23   [provider]      Allergies    Patient has no known allergies.    Review of Systems   Review of Systems  Physical Exam Updated Vital Signs BP 127/77   Pulse (!) 44   Temp (!) 93.3 F (34.1 C) (Rectal)   Resp (!) 9   Ht 5\' 7"  (1.702 m)   Wt 56.2 kg   SpO2 99%   BMI 19.42 kg/m  Physical Exam Vitals and nursing note reviewed.  Eyes:     Comments: Pupils constricted bilaterally.  Cardiovascular:     Rate and Rhythm: Bradycardia present.  Pulmonary:     Breath sounds: No wheezing or rhonchi.  Abdominal:     Tenderness: There is no abdominal tenderness.  Musculoskeletal:        General: No tenderness.     Cervical back: Neck supple.  Skin:    General: Skin is warm.   Neurological:     Mental Status: He is alert.     Comments: Decreased mental status.  Moving all extremities but sedate due to medicine given.     ED Results / Procedures / Treatments   Labs (all labs ordered are listed, but only abnormal results are displayed) Labs Reviewed  CBC WITH DIFFERENTIAL/PLATELET - Abnormal; Notable for the following components:      Result Value   RBC 4.19 (*)    Hemoglobin 11.6 (*)    HCT 37.7 (*)    RDW 16.7 (*)    All other components within normal limits  COMPREHENSIVE METABOLIC PANEL - Abnormal; Notable for the following components:   Glucose, Bld 111 (*)    Total Protein 6.4 (*)    Albumin 3.1 (*)    All other components within normal limits  URINALYSIS, W/ REFLEX TO CULTURE (INFECTION SUSPECTED) - Abnormal; Notable for the following components:   APPearance HAZY (*)    Hgb urine dipstick SMALL (*)    Nitrite POSITIVE (*)    Leukocytes,Ua LARGE (*)    Bacteria, UA FEW (*)    All other components within normal limits  CULTURE, BLOOD (ROUTINE X 2)  CULTURE, BLOOD (ROUTINE X 2)  URINE  CULTURE  MAGNESIUM  TSH  I-STAT CG4 LACTIC ACID, ED  I-STAT CG4 LACTIC ACID, ED    EKG EKG Interpretation Date/Time:  Monday February 26 2023 17:47:54 EDT Ventricular Rate:  34 PR Interval:  228 QRS Duration:  105 QT Interval:  502 QTC Calculation: 378 R Axis:   69  Text Interpretation: Sinus bradycardia Borderline prolonged PR interval Confirmed by Benjiman Core [phone]) on 02/26/2023 6:16:19 PM  Radiology DG Chest Portable 1 View  Result Date: 02/26/2023 CLINICAL DATA:  Altered mental status EXAM: PORTABLE CHEST 1 VIEW COMPARISON:  11/10/2022 FINDINGS: Marked patient rotation. Emphysema and diffuse bronchitic changes. Slight increased interstitial and ground-glass opacity in the left mid lung and right base, difficult to exclude acute superimposed process. No pneumothorax IMPRESSION: Emphysema and bronchitic changes. Slight increased interstitial  and ground-glass opacity in the left mid lung and right base, difficult to exclude acute superimposed process/pneumonia. Electronically Signed   By: Jasmine Pang M.D.   On: 02/26/2023 20:34    Procedures Procedures    Medications Ordered in ED Medications  cefTRIAXone (ROCEPHIN) 2 g in sodium chloride 0.9 % 100 mL IVPB (2 g Intravenous New Bag/Given 02/26/23 2029)  haloperidol lactate (HALDOL) injection 5 mg (5 mg Intravenous Given 02/26/23 1840)    ED Course/ Medical Decision Making/ A&P                                 Medical Decision Making Amount and/or Complexity of Data Reviewed Labs: ordered. Radiology: ordered.  Risk Prescription drug management. Decision regarding hospitalization.  Patient reportedly brought in for mental status change.  History of schizophrenia.  Reportedly had been combative and given Versed.  Now cannot really provide much history.  Differential diagnosis does include schizophrenia but also other causes such as encephalopathy due to urinary tract infection.  Will get basic blood work.  Will check urinalysis.  Is hypothermic and bradycardic but has had but within the past.  Not necessarily clear for infection at this time.  Patient has become more awake now.  More combative.  Still not able to answer questions.  Will give some Haldol since underlying schizophrenia.  Bradycardia has improved.  White count reassuring.  Normal lactic acid but UTI on urinalysis.  Also potential pneumonia on x-ray.  Rocephin had been given.  With mental status change and UTI feel he would benefit from mission to the hospital.  Will discuss with unassigned medicine.  Patient does have bradycardia.  Blood pressures been maintained.  It appears to be sinus.  Has previously been evaluated for the same.   CRITICAL CARE Performed by: Benjiman Core Total critical care time: 30 minutes Critical care time was exclusive of separately billable procedures and treating other  patients. Critical care was necessary to treat or prevent imminent or life-threatening deterioration. Critical care was time spent personally by me on the following activities: development of treatment plan with patient and/or surrogate as well as nursing, discussions with consultants, evaluation of patient's response to treatment, examination of patient, obtaining history from patient or surrogate, ordering and performing treatments and interventions, ordering and review of laboratory studies, ordering and review of radiographic studies, pulse oximetry and re-evaluation of patient's condition.   Discussed with hospitalist who requested cardiology consult.  Discussed with cardiology, who will consult.         Final Clinical Impression(s) / ED Diagnoses Final diagnoses:  Urinary tract infection without hematuria, site unspecified  Sinus bradycardia  Metabolic encephalopathy    Rx / DC Orders ED Discharge Orders     None         Benjiman Core, MD 02/26/23 2056    Benjiman Core, MD 02/26/23 2216

## 2023-02-26 NOTE — ED Notes (Signed)
Nurse aware of low HR between 39-43

## 2023-02-26 NOTE — ED Notes (Signed)
Phlebotomy consulted to obtain I-Stat lactic acid

## 2023-02-27 DIAGNOSIS — G9341 Metabolic encephalopathy: Secondary | ICD-10-CM | POA: Diagnosis not present

## 2023-02-27 DIAGNOSIS — N390 Urinary tract infection, site not specified: Secondary | ICD-10-CM | POA: Diagnosis not present

## 2023-02-27 DIAGNOSIS — R001 Bradycardia, unspecified: Secondary | ICD-10-CM | POA: Diagnosis not present

## 2023-02-27 DIAGNOSIS — A419 Sepsis, unspecified organism: Secondary | ICD-10-CM | POA: Diagnosis not present

## 2023-02-27 LAB — RESPIRATORY PANEL BY PCR

## 2023-02-27 LAB — BASIC METABOLIC PANEL
Anion gap: 7 (ref 5–15)
BUN: 10 mg/dL (ref 8–23)
CO2: 23 mmol/L (ref 22–32)
Calcium: 9.3 mg/dL (ref 8.9–10.3)
Chloride: 108 mmol/L (ref 98–111)
Creatinine, Ser: 0.92 mg/dL (ref 0.61–1.24)
GFR, Estimated: >60 mL/min (ref >60)
Glucose, Bld: 79 mg/dL (ref 70–99)
Potassium: 4.3 mmol/L (ref 3.5–5.1)
Sodium: 138 mmol/L (ref 135–145)

## 2023-02-27 LAB — CBC WITH DIFFERENTIAL/PLATELET
Abs Immature Granulocytes: 0.02 10*3/uL (ref 0.00–0.07)
Basophils Absolute: 0 10*3/uL (ref 0.0–0.1)
Basophils Relative: 0 %
Eosinophils Absolute: 0 10*3/uL (ref 0.0–0.5)
Eosinophils Relative: 1 %
HCT: 37.4 % — ABNORMAL LOW (ref 39.0–52.0)
Hemoglobin: 12 g/dL — ABNORMAL LOW (ref 13.0–17.0)
Immature Granulocytes: 0 %
Lymphocytes Relative: 11 %
Lymphs Abs: 0.7 10*3/uL (ref 0.7–4.0)
MCH: 28.8 pg (ref 26.0–34.0)
MCHC: 32.1 g/dL (ref 30.0–36.0)
MCV: 89.9 fL (ref 80.0–100.0)
Monocytes Absolute: 0.6 10*3/uL (ref 0.1–1.0)
Monocytes Relative: 10 %
Neutro Abs: 4.5 10*3/uL (ref 1.7–7.7)
Neutrophils Relative %: 78 %
Platelets: UNDETERMINED 10*3/uL (ref 150–400)
RBC: 4.16 MIL/uL — ABNORMAL LOW (ref 4.22–5.81)
RDW: 16.6 % — ABNORMAL HIGH (ref 11.5–15.5)
WBC: 5.8 10*3/uL (ref 4.0–10.5)
nRBC: 0 % (ref 0.0–0.2)

## 2023-02-27 LAB — STREP PNEUMONIAE URINARY ANTIGEN: Strep Pneumo Urinary Antigen: NEGATIVE

## 2023-02-27 LAB — MAGNESIUM: Magnesium: 1.8 mg/dL (ref 1.7–2.4)

## 2023-02-27 MED ORDER — LORAZEPAM 2 MG/ML IJ SOLN
2.0000 mg | Freq: Once | INTRAMUSCULAR | Status: AC
  Administered 2023-02-27: 2 mg via INTRAVENOUS
  Filled 2023-02-27: qty 1

## 2023-02-27 NOTE — ED Notes (Signed)
MD Crosley made aware patient recent oral temp was 97.19F, per MD D/C warming blanket

## 2023-02-27 NOTE — ED Notes (Signed)
Patient sleeping and warming blanket re-applied

## 2023-02-27 NOTE — ED Notes (Signed)
Per lab, they will add-on legoionella and strep urine to previously collected specimen.

## 2023-02-27 NOTE — ED Notes (Addendum)
Patient has thrown sheet and warming blanket on floor. MD notified

## 2023-02-27 NOTE — Progress Notes (Signed)
PROGRESS NOTE  Dustin [name]    DOB: 12-03-1955, 67 y.o.  KGM:[id number]    Code Status: Full Code   DOA: 02/26/2023   LOS: 1   Brief hospital course  Dustin [name] is a 67 y.o. male with a PMH significant for schizoaffective, bipolar disorder, chronic hypertension, tobacco use.    Patient brought to the ER by [facility] from [facility] for increasingly combative behavior.  Patient given Versed in the field.  Patient recently admitted 6/7-6/20 with sepsis secondary to UTI, hypothermic and bradycardia.   In the ER presenting vitals 113/65,HR 43, RR14, temp 93.3.  Bradycardia persisted.  Patient initially somnolent likely secondary to Versed.  More cognizant, given Haldol.  UA consistent with infection.  CXR suggestive of pneumonia.  EKG sinus bradycardia HR 34, QTc normal.  Blood cultures x 2, urine culture collected.  Rocephin ordered.  Patient was admitted to medicine service for further workup and management of sepsis as outlined in detail below.  02/27/23 -hemodynamically stable  Assessment & Plan  Principal Problem:   Sepsis secondary to UTI [facility]) Active Problems:   Schizoaffective disorder, bipolar type (HCC)   Tobacco use disorder   Schizophrenia (HCC)   Sinus bradycardia   Hypothermia   Altered mental status   Aggressive behavior  Sepsis secondary to UTI (HCC)//  Bradycardia //  Hypothermia BxCx NGTD. Sent for UxCx.  - f/u cultures - continue Rocephin   Bradycardia- improved. HR in 60s now.  TSH normal.   -Cardiology consulted for bradycardia and signed off -continue telemetry    Possible pneumonia- stable ORA. Unable to answer reliable ROS questions.  chest xray: Emphysema and bronchitic changes. Slight increased interstitial and ground-glass opacity in the left mid lung and right base, difficult to exclude acute superimposed process/pneumonia. -IV doxy added -Nebulizers as needed    Altered mental status vs decompensated schiz, bipolar d/o -Haldol and Ativan PRN  ordered -psych/BH consult placed -Seroquel and zoloft resumed    Tobacco use disorder -Nicotine patch ordered    History of colon cancer  Body mass index is 19.42 kg/m.  VTE ppx: enoxaparin (LOVENOX) injection 40 mg Start: 02/26/23 2245   Diet:     Diet   Diet Heart Room service appropriate? Yes; Fluid consistency: Thin   Consultants: Psychiatry   Subjective 02/27/23    Pt reports no complaints. He states that he will not be admitted to the hospital. He is oriented to self and location. Not the year.    Objective   Vitals:   02/27/23 0130 02/27/23 0200 02/27/23 0400 02/27/23 0600  BP: 113/76 99/69 103/78 110/69  Pulse:   62   Resp: 11 19 12 12   Temp: 97.7 F (36.5 C)   97.6 F (36.4 C)  TempSrc:      SpO2: 96% 97% 96% 95%  Weight:      Height:        Intake/Output Summary (Last 24 hours) at 02/27/2023 0714 Last data filed at 02/27/2023 0047 Gross per 24 hour  Intake 341.74 ml  Output --  Net 341.74 ml   Filed Weights   02/26/23 1747  Weight: 56.2 kg     Physical Exam:  General: awake, alert, NAD. frail HEENT: atraumatic, clear conjunctiva, anicteric sclera, MMM, hearing grossly normal Respiratory: normal respiratory effort. CTAB Cardiovascular: quick capillary refill, normal S1/S2, RRR Nervous: A&O x self and location. Unable to tell me the year. Moving all extremities Skin: dry, intact, normal temperature, normal color. No rashes, lesions or ulcers  on exposed skin Psychiatry: mildly agitated and trying to get out of bed. Delusions present. Able to follow commands and express wishes. Not agreeable to taking treatments  Labs   I have personally reviewed the following labs and imaging studies CBC    Component Value Date/Time   WBC 5.8 02/27/2023 0336   RBC 4.16 (L) 02/27/2023 0336   HGB 12.0 (L) 02/27/2023 0336   HGB 13.0 02/09/2023 1006   HCT 37.4 (L) 02/27/2023 0336   HCT 40.2 02/09/2023 1006   PLT PLATELET CLUMPS NOTED ON SMEAR, UNABLE TO  ESTIMATE 02/27/2023 0336   PLT 103 (L) 02/09/2023 1006   MCV 89.9 02/27/2023 0336   MCV 86 02/09/2023 1006   MCV 83 05/14/2013 1123   MCH 28.8 02/27/2023 0336   MCHC 32.1 02/27/2023 0336   RDW 16.6 (H) 02/27/2023 0336   RDW 16.7 (H) 02/09/2023 1006   RDW 13.9 05/14/2013 1123   LYMPHSABS 0.7 02/27/2023 0336   LYMPHSABS 0.9 10/03/2021 1127   LYMPHSABS 2.0 05/14/2013 1123   MONOABS 0.6 02/27/2023 0336   MONOABS 0.8 05/14/2013 1123   EOSABS 0.0 02/27/2023 0336   EOSABS 0.0 10/03/2021 1127   EOSABS 0.1 05/14/2013 1123   BASOSABS 0.0 02/27/2023 0336   BASOSABS 0.0 10/03/2021 1127   BASOSABS 0.0 05/14/2013 1123      Latest Ref Rng & Units 02/27/2023    3:36 AM 02/26/2023    6:28 PM 02/09/2023   10:06 AM  BMP  Glucose 70 - 99 mg/dL 79  782  98   BUN 8 - 23 mg/dL 10  10  14    Creatinine 0.61 - 1.24 mg/dL 9.56  2.13  0.86   BUN/Creat Ratio 10 - 24   14   Sodium 135 - 145 mmol/L 138  138  139   Potassium 3.5 - 5.1 mmol/L 4.3  3.9  4.1   Chloride 98 - 111 mmol/L 108  105  105   CO2 22 - 32 mmol/L 23  26  23    Calcium 8.9 - 10.3 mg/dL 9.3  9.2  9.5     DG Chest Portable 1 View  Result Date: 02/26/2023 CLINICAL DATA:  Altered mental status EXAM: PORTABLE CHEST 1 VIEW COMPARISON:  11/10/2022 FINDINGS: Marked patient rotation. Emphysema and diffuse bronchitic changes. Slight increased interstitial and ground-glass opacity in the left mid lung and right base, difficult to exclude acute superimposed process. No pneumothorax IMPRESSION: Emphysema and bronchitic changes. Slight increased interstitial and ground-glass opacity in the left mid lung and right base, difficult to exclude acute superimposed process/pneumonia. Electronically Signed   By: Jasmine Pang M.D.   On: 02/26/2023 20:34    Disposition Plan & Communication  Patient status: Inpatient  Admitted From: SNF Planned disposition location: Skilled nursing facility Anticipated discharge date: TBD pending psych evaluation   Family  Communication: none at bedside    Author: Leeroy Bock, DO [facility] 02/27/2023, 7:14 AM   Available by Epic secure chat 7AM-7PM. If 7PM-7AM, please contact night-coverage.  [facility] contact information found on [email or web address].

## 2023-02-27 NOTE — Consult Note (Signed)
[name] Psychiatry Consult Note   Service Date: February 27, 2023 LOS:  LOS: 1 day  MRN: [id number] Type of consult:  New   Primary Psychiatric Diagnoses  Delirium due to multiple etiologies, acute, mixed Hx schizoaffective disorder, bipolar type  Assessment  Dustin [name] is a 67 y.o. male with a past psychiatric history of schizoaffective vs bipolar disorder, substance use history of tobacco use disorder, and significant PMHx of chronic HTN . Psychiatry was consulted for "presented intoxicated, had suicidal ideations" by Benita Gutter, DO  On evaluation, the patient demonstrates a disturbance in attention accompanied by reduced awareness of the environment that developed over a short period of time (hours to days) and represents a change in baseline attention and awareness, and tends to fluctuate in severity during the course of a day. There is also a notable disturbance in cognition, namely disorientation and perception. The aforementioned disturbances have lasted for days and are not better explained by another pre-existing, established, or evolving neurocognitive disorder/s; they also do not occur in the context of a severely reduced level of arousal, such as coma. Lastly, there is evidence from the history, physical examination, or laboratory findings that the disturbance observed is a direct physiological consequence of other medical condition/s, including sepsis secondary to UTI / pneumonia. The delirium presents with level of psychomotor activity that rapidly fluctutates. His current presentation of altered mental status is most consistent with delirium due to multiple etiologies, acute, mixed  Unclear at this time as to what history of schizoaffective disorder is playing a role into patient's current presentation as on chart review, patient has not received his LAI paliperidone for the month of September. Given his ongoing medical issues, still favor delirium to explain perceptual  disturbances and reported aggression.  Though patient endorsing vague homidical ideations with identifiable target, these statements should be interpreted with caution as patient is currently obtunded. This can be explored further when patient becomes more alert and oriented.   Diagnoses:  Active Hospital problems: Principal Problem:   Sepsis secondary to UTI [facility]) Active Problems:   Tobacco use disorder   Schizoaffective disorder, bipolar type (HCC)   Schizophrenia (HCC)   Sinus bradycardia   Hypothermia   Altered mental status   Aggressive behavior    Plan and Recommendations  ## Interventions (medications, psychoeducation, etc):  -- continue haloperidone 5 mg IV every 6 hours as needed for agitation -- treatment of underlying medical conditions (UTI, pneumonia) per primary team to address root causes of delirium -- can consider restarting LAI paliperidone once patient no longer delirious to address history of schizoaffective disorder  ## Further Work-up:  -- none  -- most recent EKG on 02/27/2023 had QtC of 466 -- Pertinent labwork reviewed earlier this admission includes: CMP, CBC w/ diff, and CXR  ## Medical Decision Making Capacity:  -- Not formally assessed during this encounter  ## Disposition:  -- Cannot be determined at this time. Will continue to evaluate patient's mental status.  ## Behavioral / Environmental:  -- Standard delirium precautions and Fall precautions  ##Legal Status -- Patient voluntary  Thank you for this consult request. Our recommendations are listed above.  We will continue to follow patient's hospital course  ## Safety and Observation Level:  - Based on my clinical evaluation, I estimate the patient to be at low risk of self harm in the current setting - At this time, we recommend a no additional level of observation at present, though threshold to provide patient  with 1-to-1 sitter should be low given aggression on presentation. This  decision is based on my review of the chart including patient's history and current presentation, interview of the patient, mental status examination, and consideration of suicide risk including evaluating suicidal ideation, plan, intent, suicidal or self-harm behaviors, risk factors, and protective factors. This judgment is based on our ability to directly address suicide risk, implement suicide prevention strategies and develop a safety plan while the patient is in the clinical setting. Please contact our team if there is a concern that risk level has changed.  Augusto Gamble, MD  History Obtained on Initial Interview  Relevant Aspects of Hospital Course:  Admitted on 02/26/2023 for mental status change (combative, swinging at nursing home staff).  Patient Report:  Patient was found to be very somnolent but did rouse to voice. He was oriented to self and location but not to situation ("I got hit in the stomach") or time ("I don't care about that").  When asked if patient had any thoughts of wanting to hurt himself, patient denies. When asked if he has thoughts of hurting other people, he endorses. I asked patient who he wants to hurt and he responds, "a lot of people." He was unable to give me any specific names.  When asked if patient could perform months of the year backwards, he says, "leave me alone right now - I got a bunch of retired people, my brothers - come find me." He then continued to mumble unintelligibly until he dozed off to sleep.  I terminated interview as patient became increasingly irritable and refused to answer further questions.  Psychiatric ROS:  Could not be obtained - patient disoriented   Psychiatric and Social History   Psychiatric History  Information collected from patient and available chart review  Prev dx/sx: schizoaffective disorder, bipolar type Current Psych Provider: unknown Current Psych Meds: sertraline, quetiapine Past Psych Meds: haloperidol LAI,  paliperidone LAI  Family psych history: not obtained  Social History:  Living situation: permanent nursing home  Substance Use History: Not obtained   Other Histories  These are pulled from EMR and updated if appropriate.   Family History:  The patient's family history includes Hypertension in his brother, brother, and brother; Lung cancer in his father; Prostate cancer in his brother.  Medical History: Past Medical History:  Diagnosis Date   Schizophrenia [facility])     Surgical History: Past Surgical History:  Procedure Laterality Date   COLON SURGERY     COLONOSCOPY N/A 10/06/2021   Procedure: COLONOSCOPY;  Surgeon: Toledo, Boykin Nearing, MD;  Location: [facility] ENDOSCOPY;  Service: Gastroenterology;  Laterality: N/A;   ESOPHAGOGASTRODUODENOSCOPY N/A 10/06/2021   Procedure: ESOPHAGOGASTRODUODENOSCOPY (EGD);  Surgeon: Toledo, Boykin Nearing, MD;  Location: [facility] ENDOSCOPY;  Service: Gastroenterology;  Laterality: N/A;   PARTIAL COLECTOMY  10/13/2021   Procedure: RIGHT COLECTOMY;  Surgeon: Leafy Ro, MD;  Location: [facility] ORS;  Service: General;;    Medications:   Current Facility-Administered Medications:    acetaminophen (TYLENOL) tablet 650 mg, 650 mg, Oral, Q6H PRN **OR** acetaminophen (TYLENOL) suppository 650 mg, 650 mg, Rectal, Q6H PRN, Crosley, Debby, MD   cefTRIAXone (ROCEPHIN) 1 g in sodium chloride 0.9 % 100 mL IVPB, 1 g, Intravenous, Q24H, Crosley, Debby, MD   doxycycline (VIBRAMYCIN) 100 mg in sodium chloride 0.9 % 250 mL IVPB, 100 mg, Intravenous, Q12H, Crosley, Debby, MD, Last Rate: 125 mL/hr at 02/27/23 1022, 100 mg at 02/27/23 1022   enoxaparin (LOVENOX) injection 40 mg, 40 mg,  Subcutaneous, Q24H, Crosley, Debby, MD   haloperidol lactate (HALDOL) injection 5 mg, 5 mg, Intravenous, Q6H PRN, Joneen Roach, Debby, MD, 5 mg at 02/27/23 0037   midodrine (PROAMATINE) tablet 2.5 mg, 2.5 mg, Oral, TID WC, Crosley, Debby, MD   nicotine (NICODERM CQ - dosed in mg/24 hours) patch 14 mg,  14 mg, Transdermal, Daily, Crosley, Debby, MD, 14 mg at 02/26/23 2247   ondansetron (ZOFRAN) tablet 4 mg, 4 mg, Oral, Q6H PRN **OR** ondansetron (ZOFRAN) injection 4 mg, 4 mg, Intravenous, Q6H PRN, Crosley, Debby, MD   QUEtiapine (SEROQUEL) tablet 25 mg, 25 mg, Oral, BID, Crosley, Debby, MD   senna-docusate (Senokot-S) tablet 1 tablet, 1 tablet, Oral, QHS PRN, Crosley, Debby, MD   sertraline (ZOLOFT) tablet 50 mg, 50 mg, Oral, Daily, Crosley, Debby, MD  Current Outpatient Medications:    midodrine (PROAMATINE) 2.5 MG tablet, Take 1 tablet (2.5 mg total) by mouth 3 (three) times daily with meals., Disp: , Rfl:    QUEtiapine (SEROQUEL) 50 MG tablet, Take 50 mg by mouth 2 (two) times daily., Disp: , Rfl:    sertraline (ZOLOFT) 50 MG tablet, Take 50 mg by mouth daily., Disp: , Rfl:    nicotine (NICODERM CQ - DOSED IN MG/24 HOURS) 14 mg/24hr patch, Place 1 patch (14 mg total) onto the skin daily. (Patient not taking: Reported on 02/27/2023), Disp: 28 patch, Rfl: 0  Allergies: No Known Allergies  Exam Findings  Vital signs:  Temp:  [92.6 F (33.7 C)-98.2 F (36.8 C)] 98.2 F (36.8 C) (09/24 1019) Pulse Rate:  [40-72] 64 (09/24 1019) Resp:  [9-19] 15 (09/24 1019) BP: (94-131)/(65-88) 118/82 (09/24 1019) SpO2:  [95 %-100 %] 97 % (09/24 1019) Weight:  [56.2 kg] 56.2 kg (09/23 1747)  Psychiatric Specialty Exam:  Presentation  General Appearance: Casual; Disheveled (somnolent)  Eye Contact:Absent  Speech:Garbled  Speech Volume:Decreased  Handedness:-- (not assessed)   Mood and Affect  Mood:-- (unable to obtain - patient very somnolent and was refusing to answer further questions)  Affect:Flat   Thought Process  Thought Processes:-- (unable to assess - patient very somnolent and was refusing to answer further questions)  Descriptions of Associations:Loose  Orientation:Partial  Thought Content:Illogical; Scattered  History of Schizophrenia/Schizoaffective  disorder:Yes  Duration of Psychotic Symptoms:Greater than six months  Hallucinations:Hallucinations: -- (unable to assess - patient very somnolent and was refusing to answer further questions)  Ideas of Reference:-- (unable to assess - patient very somnolent and was refusing to answer further questions)  Suicidal Thoughts:Suicidal Thoughts: No  Homicidal Thoughts:Homicidal Thoughts: Yes, Passive HI Passive Intent and/or Plan: Without Plan   Sensorium  Memory:Immediate Fair; Recent Fair; Remote Fair  Judgment:Impaired  Insight:Lacking   Executive Functions  Concentration:Poor  Attention Span:Poor  Recall:Poor  Fund of Knowledge:Poor  Language:Poor   Psychomotor Activity  Psychomotor Activity:Psychomotor Activity: Decreased   Assets  Assets:Resilience   Sleep  Sleep:Sleep: -- (unable to assess - patient very somnolent and was refusing to answer further questions)   Physical Exam: Physical Exam Vitals and nursing note reviewed.  Constitutional:      Comments: somnolent  HENT:     Head: Normocephalic and atraumatic.  Pulmonary:     Effort: Pulmonary effort is normal.    Blood pressure 118/82, pulse 64, temperature 98.2 F (36.8 C), temperature source Axillary, resp. rate 15, height 5\' 7"  (1.702 m), weight 56.2 kg, SpO2 97%. Body mass index is 19.42 kg/m.

## 2023-02-27 NOTE — ED Notes (Signed)
ED TO INPATIENT HANDOFF REPORT  ED Nurse Name and Phone #: Einar Grad [phone]  S Name/Age/Gender Dustin [name] 67 y.o. male Room/Bed: 003C/003C  Code Status   Code Status: Full Code  Home/SNF/Other Home Patient oriented to: self and place Is this baseline? Unknown  Triage Complete: Triage complete  Chief Complaint Sepsis secondary to UTI (HCC) [A41.9, N39.0]  Triage Note [employer] called out to [facility] for IVC due to paranoia hx schizophrenia.  Patient having ams and reports he takes all his meds.  Strong smell of urine.  A&Ox3  patient is very uncooperative on scene with ems.  Patient continues to be combative and will not allow anyone to do anything. Versed 5mg  IM given by EMS   Allergies No Known Allergies  Level of Care/Admitting Diagnosis ED Disposition     ED Disposition  Admit   Condition  --   Comment  Hospital Area: [facility] [[id number]]  Level of Care: Progressive [102]  Admit to Progressive based on following criteria: MULTISYSTEM THREATS such as stable sepsis, metabolic/electrolyte imbalance with or without encephalopathy that is responding to early treatment.  May [facility] to [facility] if equivalent level of care is available:: No  Covid Evaluation: Asymptomatic - no recent exposure (last 10 days) testing not required  Diagnosis: Sepsis secondary to UTI [facility] [[id number]]  Admitting Physician: Gery Pray [[id number]]  Attending Physician: Gery Pray [phone]  Certification:: I certify this patient will need inpatient services for at least 2 midnights  Expected Medical Readiness: 02/28/2023          B Medical/Surgery History Past Medical History:  Diagnosis Date   Schizophrenia [facility])    Past Surgical History:  Procedure Laterality Date   COLON SURGERY     COLONOSCOPY N/A 10/06/2021   Procedure: COLONOSCOPY;  Surgeon: Toledo, Boykin Nearing, MD;  Location: [facility] ENDOSCOPY;  Service: Gastroenterology;  Laterality: N/A;    ESOPHAGOGASTRODUODENOSCOPY N/A 10/06/2021   Procedure: ESOPHAGOGASTRODUODENOSCOPY (EGD);  Surgeon: Toledo, Boykin Nearing, MD;  Location: [facility] ENDOSCOPY;  Service: Gastroenterology;  Laterality: N/A;   PARTIAL COLECTOMY  10/13/2021   Procedure: RIGHT COLECTOMY;  Surgeon: Leafy Ro, MD;  Location: [facility] ORS;  Service: General;;     A IV Location/Drains/Wounds Patient Lines/Drains/Airways Status     Active Line/Drains/Airways     Name Placement date Placement time Site Days   Peripheral IV 02/26/23 20 G 1" Anterior;Right Forearm 02/26/23  1824  Forearm  1   Pressure Injury 11/10/22 Hip Left Stage 2 -  Partial thickness loss of dermis presenting as a shallow open injury with a red, pink wound bed without slough. 11/10/22  1535  -- 109   Pressure Injury 11/16/22 Coccyx Medial Stage 2 -  Partial thickness loss of dermis presenting as a shallow open injury with a red, pink wound bed without slough. Abbrassion from what appears to be a poped blister 11/16/22  2030  -- 103            Intake/Output Last 24 hours  Intake/Output Summary (Last 24 hours) at 02/27/2023 1220 Last data filed at 02/27/2023 0047 Gross per 24 hour  Intake 341.74 ml  Output --  Net 341.74 ml    Labs/Imaging Results for orders placed or performed during the hospital encounter of 02/26/23 (from the past 48 hour(s))  CBC with Differential     Status: Abnormal   Collection Time: 02/26/23  6:28 PM  Result Value Ref Range   WBC 4.3 4.0 - 10.5  K/uL   RBC 4.19 (L) 4.22 - 5.81 MIL/uL   Hemoglobin 11.6 (L) 13.0 - 17.0 g/dL   HCT 01.0 (L) 27.2 - 53.6 %   MCV 90.0 80.0 - 100.0 fL   MCH 27.7 26.0 - 34.0 pg   MCHC 30.8 30.0 - 36.0 g/dL   RDW 64.4 (H) 03.4 - 74.2 %   Platelets 181 150 - 400 K/uL   nRBC 0.0 0.0 - 0.2 %   Neutrophils Relative % 67 %   Neutro Abs 2.9 1.7 - 7.7 K/uL   Lymphocytes Relative 19 %   Lymphs Abs 0.8 0.7 - 4.0 K/uL   Monocytes Relative 11 %   Monocytes Absolute 0.5 0.1 - 1.0 K/uL    Eosinophils Relative 2 %   Eosinophils Absolute 0.1 0.0 - 0.5 K/uL   Basophils Relative 0 %   Basophils Absolute 0.0 0.0 - 0.1 K/uL   Immature Granulocytes 1 %   Abs Immature Granulocytes 0.02 0.00 - 0.07 K/uL    Comment: Performed at [facility], [address], [place], [place] [address]  Comprehensive metabolic panel     Status: Abnormal   Collection Time: 02/26/23  6:28 PM  Result Value Ref Range   Sodium 138 135 - 145 mmol/L   Potassium 3.9 3.5 - 5.1 mmol/L   Chloride 105 98 - 111 mmol/L   CO2 26 22 - 32 mmol/L   Glucose, Bld 111 (H) 70 - 99 mg/dL    Comment: Glucose reference range applies only to samples taken after fasting for at least 8 hours.   BUN 10 8 - 23 mg/dL   Creatinine, Ser 8.75 0.61 - 1.24 mg/dL   Calcium 9.2 8.9 - 64.3 mg/dL   Total Protein 6.4 (L) 6.5 - 8.1 g/dL   Albumin 3.1 (L) 3.5 - 5.0 g/dL   AST 17 15 - 41 U/L   ALT 13 0 - 44 U/L   Alkaline Phosphatase 91 38 - 126 U/L   Total Bilirubin 0.4 0.3 - 1.2 mg/dL   GFR, Estimated >32 >95 mL/min    Comment: (NOTE) Calculated using the CKD-EPI Creatinine Equation (2021)    Anion gap 7 5 - 15    Comment: Performed at [facility], [address], [place], [place] [address]  Magnesium     Status: None   Collection Time: 02/26/23  6:28 PM  Result Value Ref Range   Magnesium 1.9 1.7 - 2.4 mg/dL    Comment: Performed at [facility], [address] [place], [place] [address]  Culture, blood (routine x 2)     Status: None (Preliminary result)   Collection Time: 02/26/23  6:29 PM   Specimen: BLOOD  Result Value Ref Range   Specimen Description BLOOD SITE NOT SPECIFIED    Special Requests      BOTTLES DRAWN AEROBIC AND ANAEROBIC Blood Culture results may not be optimal due to an excessive volume of blood received in culture bottles   Culture      NO GROWTH < 24 HOURS Performed at [facility], [address], [place], [place] [address]    Report Status PENDING   TSH     Status: None    Collection Time: 02/26/23  6:29 PM  Result Value Ref Range   TSH 0.890 0.350 - 4.500 uIU/mL    Comment: Performed by a 3rd Generation assay with a functional sensitivity of <=0.01 uIU/mL. Performed at [facility], [address] [place], [place] [address]   Culture, blood (routine x 2)     Status: None (Preliminary result)   Collection Time: 02/26/23  6:48 PM   Specimen: BLOOD  Result Value Ref Range   Specimen Description BLOOD SITE NOT SPECIFIED    Special Requests      BOTTLES DRAWN AEROBIC AND ANAEROBIC Blood Culture adequate volume   Culture      NO GROWTH < 24 HOURS Performed at [facility], [address], [place], [place] [address]    Report Status PENDING   Urinalysis, w/ Reflex to Culture (Infection Suspected) -Urine, Unspecified Source     Status: Abnormal   Collection Time: 02/26/23  6:52 PM  Result Value Ref Range   Specimen Source URINE, UNSPE    Color, Urine YELLOW YELLOW   APPearance HAZY (A) CLEAR   Specific Gravity, Urine 1.013 1.005 - 1.030   pH 5.0 5.0 - 8.0   Glucose, UA NEGATIVE NEGATIVE mg/dL   Hgb urine dipstick SMALL (A) NEGATIVE   Bilirubin Urine NEGATIVE NEGATIVE   Ketones, ur NEGATIVE NEGATIVE mg/dL   Protein, ur NEGATIVE NEGATIVE mg/dL   Nitrite POSITIVE (A) NEGATIVE   Leukocytes,Ua LARGE (A) NEGATIVE   RBC / HPF 6-10 0 - 5 RBC/hpf   WBC, UA >50 0 - 5 WBC/hpf    Comment:        Reflex urine culture not performed if WBC <=10, OR if Squamous epithelial cells >5. If Squamous epithelial cells >5 suggest recollection.    Bacteria, UA FEW (A) NONE SEEN   Squamous Epithelial / HPF 0-5 0 - 5 /HPF    Comment: Performed at [facility], [address], [place], [place] [address]  I-Stat Lactic Acid     Status: None   Collection Time: 02/26/23  7:13 PM  Result Value Ref Range   Lactic Acid, Venous 1.3 0.5 - 1.9 mmol/L  Strep pneumoniae urinary antigen     Status: None   Collection Time: 02/27/23  3:06 AM  Result Value Ref Range    Strep Pneumo Urinary Antigen NEGATIVE NEGATIVE    Comment:        Infection due to S. pneumoniae cannot be absolutely ruled out since the antigen present may be below the detection limit of the test. Performed at [facility], [address], [place], [place] [address]   Basic metabolic panel     Status: None   Collection Time: 02/27/23  3:36 AM  Result Value Ref Range   Sodium 138 135 - 145 mmol/L   Potassium 4.3 3.5 - 5.1 mmol/L   Chloride 108 98 - 111 mmol/L   CO2 23 22 - 32 mmol/L   Glucose, Bld 79 70 - 99 mg/dL    Comment: Glucose reference range applies only to samples taken after fasting for at least 8 hours.   BUN 10 8 - 23 mg/dL   Creatinine, Ser 6.01 0.61 - 1.24 mg/dL   Calcium 9.3 8.9 - 09.3 mg/dL   GFR, Estimated >23 >55 mL/min    Comment: (NOTE) Calculated using the CKD-EPI Creatinine Equation (2021)    Anion gap 7 5 - 15    Comment: Performed at [facility], [address], [place], [place] [address]  Magnesium     Status: None   Collection Time: 02/27/23  3:36 AM  Result Value Ref Range   Magnesium 1.8 1.7 - 2.4 mg/dL    Comment: Performed at [facility], [address], [place], [place] [address]  CBC with Differential/Platelet     Status: Abnormal   Collection Time: 02/27/23  3:36 AM  Result Value Ref Range   WBC 5.8 4.0 - 10.5 K/uL   RBC 4.16 (L) 4.22 - 5.81 MIL/uL   Hemoglobin 12.0 (L) 13.0 - 17.0 g/dL   HCT 65.7 (L) 84.6 - 96.2 %   MCV 89.9 80.0 - 100.0 fL   MCH 28.8 26.0 - 34.0 pg   MCHC 32.1 30.0 - 36.0 g/dL   RDW 95.2 (H) 84.1 - 32.4 %   Platelets PLATELET CLUMPS NOTED ON SMEAR, UNABLE TO ESTIMATE 150 - 400 K/uL   nRBC 0.0 0.0 - 0.2 %   Neutrophils Relative % 78 %   Neutro Abs 4.5 1.7 - 7.7 K/uL   Lymphocytes Relative 11 %   Lymphs Abs 0.7 0.7 - 4.0 K/uL   Monocytes Relative 10 %   Monocytes Absolute 0.6 0.1 - 1.0 K/uL   Eosinophils Relative 1 %   Eosinophils Absolute 0.0 0.0 - 0.5 K/uL   Basophils Relative 0 %    Basophils Absolute 0.0 0.0 - 0.1 K/uL   Immature Granulocytes 0 %   Abs Immature Granulocytes 0.02 0.00 - 0.07 K/uL    Comment: Performed at [facility], [address], [place], [place] [address]  Respiratory (~20 pathogens) panel by PCR     Status: None   Collection Time: 02/27/23  3:46 AM   Specimen: Nasopharyngeal Swab; Respiratory  Result Value Ref Range   Adenovirus NOT DETECTED NOT DETECTED   Coronavirus 229E NOT DETECTED NOT DETECTED    Comment: (NOTE) The Coronavirus on the Respiratory Panel, DOES NOT test for the novel  Coronavirus (2019 nCoV)    Coronavirus HKU1 NOT DETECTED NOT DETECTED   Coronavirus NL63 NOT DETECTED NOT DETECTED   Coronavirus OC43 NOT DETECTED NOT DETECTED   Metapneumovirus NOT DETECTED NOT DETECTED   Rhinovirus / Enterovirus NOT DETECTED NOT DETECTED   Influenza A NOT DETECTED NOT DETECTED   Influenza B NOT DETECTED NOT DETECTED   Parainfluenza Virus 1 NOT DETECTED NOT DETECTED   Parainfluenza Virus 2 NOT DETECTED NOT DETECTED   Parainfluenza Virus 3 NOT DETECTED NOT DETECTED   Parainfluenza Virus 4 NOT DETECTED NOT DETECTED   Respiratory Syncytial Virus NOT DETECTED NOT DETECTED   Bordetella pertussis NOT DETECTED NOT DETECTED   Bordetella Parapertussis NOT DETECTED NOT DETECTED   Chlamydophila pneumoniae NOT DETECTED NOT DETECTED   Mycoplasma pneumoniae NOT DETECTED NOT DETECTED    Comment: Performed at [name], [address], [place], [place] [address]   DG Chest Portable 1 View  Result Date: 02/26/2023 CLINICAL DATA:  Altered mental status EXAM: PORTABLE CHEST 1 VIEW COMPARISON:  11/10/2022 FINDINGS: Marked patient rotation. Emphysema and diffuse bronchitic changes. Slight increased interstitial and ground-glass opacity in the left mid lung and right base, difficult to exclude acute superimposed process. No pneumothorax IMPRESSION: Emphysema and bronchitic changes. Slight increased interstitial and ground-glass opacity in the  left mid lung and right base, difficult to exclude acute superimposed process/pneumonia. Electronically Signed   By: Jasmine Pang M.D.   On: 02/26/2023 20:34    Pending Labs Unresulted Labs (From admission, onward)     Start     Ordered   03/05/23 0500  Creatinine, serum  (enoxaparin (LOVENOX)    CrCl >/= 30 ml/min)  Weekly,   R     Comments: while on enoxaparin therapy  02/26/23 2241   02/27/23 0724  Urinalysis, w/ Reflex to Culture (Infection Suspected) -Urine, Clean Catch  (Urine Labs)  Add-on,   AD       Question:  Specimen Source  Answer:  Urine, Clean Catch   02/27/23 0723   02/27/23 0306  Legionella Pneumophila Serogp 1 Ur Ag  Once,   R        02/27/23 0305   02/26/23 1852  Urine Culture  Once,   R        02/26/23 1852            Vitals/Pain Today's Vitals   02/27/23 0715 02/27/23 0800 02/27/23 1019 02/27/23 1215  BP: 94/70 115/74 118/82 138/84  Pulse: (!) 58 62 64 63  Resp: 12 14 15 14   Temp:   98.2 F (36.8 C)   TempSrc:   Axillary   SpO2: 95% 96% 97% 96%  Weight:      Height:      PainSc:    Asleep    Isolation Precautions Droplet precaution  Medications Medications  doxycycline (VIBRAMYCIN) 100 mg in sodium chloride 0.9 % 250 mL IVPB (100 mg Intravenous New Bag/Given 02/27/23 1022)  cefTRIAXone (ROCEPHIN) 1 g in sodium chloride 0.9 % 100 mL IVPB (has no administration in time range)  nicotine (NICODERM CQ - dosed in mg/24 hours) patch 14 mg (14 mg Transdermal Patient Refused/Not Given 02/27/23 1013)  haloperidol lactate (HALDOL) injection 5 mg (5 mg Intravenous Given 02/27/23 0037)  enoxaparin (LOVENOX) injection 40 mg (40 mg Subcutaneous Patient Refused/Not Given 02/26/23 2343)  acetaminophen (TYLENOL) tablet 650 mg (has no administration in time range)    Or  acetaminophen (TYLENOL) suppository 650 mg (has no administration in time range)  senna-docusate (Senokot-S) tablet 1 tablet (has no administration in time range)  ondansetron (ZOFRAN) tablet 4 mg  (has no administration in time range)    Or  ondansetron (ZOFRAN) injection 4 mg (has no administration in time range)  midodrine (PROAMATINE) tablet 2.5 mg (2.5 mg Oral Patient Refused/Not Given 02/27/23 0830)  QUEtiapine (SEROQUEL) tablet 25 mg (25 mg Oral Patient Refused/Not Given 02/27/23 1012)  sertraline (ZOLOFT) tablet 50 mg (50 mg Oral Patient Refused/Not Given 02/27/23 1012)  haloperidol lactate (HALDOL) injection 5 mg (5 mg Intravenous Given 02/26/23 1840)  cefTRIAXone (ROCEPHIN) 2 g in sodium chloride 0.9 % 100 mL IVPB (0 g Intravenous Stopped 02/26/23 2059)  LORazepam (ATIVAN) injection 2 mg (2 mg Intravenous Given 02/27/23 0106)    Mobility Unknown patient's baseline-patient has not ambulated on shift today     Focused Assessments Neuro Assessment Handoff: Neuro Assessment: Exceptions to WDL       R Recommendations: See Admitting Provider Note  Report given to:   Additional Notes:

## 2023-02-27 NOTE — ED Notes (Signed)
Patient refusing treatment and threatening violence.

## 2023-02-27 NOTE — ED Notes (Signed)
Patient refusing midodrine and breakfast.

## 2023-02-27 NOTE — Plan of Care (Signed)

## 2023-02-28 DIAGNOSIS — F17200 Nicotine dependence, unspecified, uncomplicated: Secondary | ICD-10-CM | POA: Diagnosis not present

## 2023-02-28 DIAGNOSIS — N390 Urinary tract infection, site not specified: Secondary | ICD-10-CM | POA: Diagnosis not present

## 2023-02-28 DIAGNOSIS — R001 Bradycardia, unspecified: Secondary | ICD-10-CM | POA: Diagnosis not present

## 2023-02-28 DIAGNOSIS — G9341 Metabolic encephalopathy: Secondary | ICD-10-CM | POA: Diagnosis not present

## 2023-02-28 LAB — CBC
HCT: 38.8 % — ABNORMAL LOW (ref 39.0–52.0)
Hemoglobin: 11.8 g/dL — ABNORMAL LOW (ref 13.0–17.0)
MCH: 27.5 pg (ref 26.0–34.0)
MCHC: 30.4 g/dL (ref 30.0–36.0)
MCV: 90.4 fL (ref 80.0–100.0)
Platelets: 150 10*3/uL (ref 150–400)
RBC: 4.29 MIL/uL (ref 4.22–5.81)
RDW: 16.9 % — ABNORMAL HIGH (ref 11.5–15.5)
WBC: 5.6 10*3/uL (ref 4.0–10.5)
nRBC: 0 % (ref 0.0–0.2)

## 2023-02-28 LAB — BASIC METABOLIC PANEL
Anion gap: 10 (ref 5–15)
BUN: 14 mg/dL (ref 8–23)
CO2: 22 mmol/L (ref 22–32)
Calcium: 9.3 mg/dL (ref 8.9–10.3)
Chloride: 108 mmol/L (ref 98–111)
Creatinine, Ser: 1.09 mg/dL (ref 0.61–1.24)
GFR, Estimated: >60 mL/min (ref >60)
Glucose, Bld: 74 mg/dL (ref 70–99)
Potassium: 4.2 mmol/L (ref 3.5–5.1)
Sodium: 140 mmol/L (ref 135–145)

## 2023-02-28 LAB — LEGIONELLA PNEUMOPHILA SEROGP 1 UR AG: L. pneumophila Serogp 1 Ur Ag: NEGATIVE

## 2023-02-28 MED ORDER — PALIPERIDONE ER 3 MG PO TB24
3.0000 mg | ORAL_TABLET | Freq: Every day | ORAL | Status: DC
  Filled 2023-02-28: qty 1

## 2023-02-28 MED ORDER — PALIPERIDONE ER 3 MG PO TB24
3.0000 mg | ORAL_TABLET | Freq: Every day | ORAL | Status: DC
  Administered 2023-02-28: 3 mg via ORAL
  Filled 2023-02-28 (×3): qty 1

## 2023-02-28 MED ORDER — QUETIAPINE FUMARATE 25 MG PO TABS
25.0000 mg | ORAL_TABLET | Freq: Two times a day (BID) | ORAL | Status: DC
  Administered 2023-02-28 – 2023-03-01 (×3): 25 mg via ORAL
  Filled 2023-02-28 (×3): qty 1

## 2023-02-28 MED ORDER — QUETIAPINE FUMARATE 50 MG PO TABS: 50.0000 mg | ORAL_TABLET | Freq: Two times a day (BID) | ORAL | Status: DC

## 2023-02-28 NOTE — NC FL2 (Signed)
[place] MEDICAID FL2 LEVEL OF CARE FORM     IDENTIFICATION  Patient Name: Dustin [name] Birthdate: 08/31/1955 Sex: male Admission Date (Current Location): 02/26/2023  [facility] and [place] Number:  Producer, television/film/video and Address:  [facility], [address] [place], [place] [address]      Provider Number: [id number]  Attending Physician Name and Address:  Maretta Bees, MD  Relative Name and Phone Number:       Current Level of Care: Hospital Recommended Level of Care: Skilled Nursing Facility Prior Approval Number:    Date Approved/Denied:   PASRR Number: [id number]  Discharge Plan: SNF    Current Diagnoses: Patient Active Problem List   Diagnosis Date Noted   Urinary tract infection without hematuria 02/27/2023   Metabolic encephalopathy 02/27/2023   Sinus bradycardia 02/26/2023   Hypothermia 02/26/2023   Altered mental status 02/26/2023   Aggressive behavior 02/26/2023   History of colon cancer, stage I 02/09/2023   Bacteremia 11/12/2022   Sepsis secondary to UTI (HCC) 11/10/2022   Pressure injury of skin 11/10/2022   Protein-calorie malnutrition, severe 07/22/2022   COVID-19 virus infection 07/20/2022   Hypotension 07/20/2022   Aspiration pneumonia (HCC) 07/20/2022   Adenocarcinoma of colon (HCC) 12/13/2021   Malignant neoplasm of ascending colon (HCC) 11/08/2021   Family history of cancer 11/08/2021   IDA (iron deficiency anemia)    Colonic mass 10/06/2021   Microcytic anemia 10/05/2021   Syncope 10/05/2021   Symptomatic anemia    Schizophrenia (HCC) 07/12/2018   Schizoaffective disorder, bipolar type (HCC) 01/24/2017   Tobacco use disorder 11/29/2016    Orientation RESPIRATION BLADDER Height & Weight     Self, Time, Situation, Place  Normal Incontinent Weight: 124 lb (56.2 kg) Height:  5\' 7"  (170.2 cm)  BEHAVIORAL SYMPTOMS/MOOD NEUROLOGICAL BOWEL NUTRITION STATUS      Continent Diet (See DC Summary)   AMBULATORY STATUS COMMUNICATION OF NEEDS Skin   Total Care Verbally Normal                       Personal Care Assistance Level of Assistance  Bathing, Dressing, Feeding Bathing Assistance: Limited assistance Feeding assistance: Limited assistance Dressing Assistance: Limited assistance     Functional Limitations Info             SPECIAL CARE FACTORS FREQUENCY                       Contractures Contractures Info: Not present    Additional Factors Info  Code Status, Allergies Code Status Info: Full Allergies Info: NKA           Current Medications (02/28/2023):  This is the current hospital active medication list Current Facility-Administered Medications  Medication Dose Route Frequency Provider Last Rate Last Admin   acetaminophen (TYLENOL) tablet 650 mg  650 mg Oral Q6H PRN Crosley, Debby, MD       Or   acetaminophen (TYLENOL) suppository 650 mg  650 mg Rectal Q6H PRN Crosley, Debby, MD       cefTRIAXone (ROCEPHIN) 1 g in sodium chloride 0.9 % 100 mL IVPB  1 g Intravenous Q24H Crosley, Debby, MD 200 mL/hr at 02/27/23 2104 1 g at 02/27/23 2104   doxycycline (VIBRAMYCIN) 100 mg in sodium chloride 0.9 % 250 mL IVPB  100 mg Intravenous Q12H Crosley, Debby, MD 125 mL/hr at 02/27/23 2200 100 mg at 02/27/23 2200   enoxaparin (LOVENOX) injection 40  mg  40 mg Subcutaneous Q24H Crosley, Debby, MD       haloperidol lactate (HALDOL) injection 5 mg  5 mg Intravenous Q6H PRN Crosley, Debby, MD   5 mg at 02/27/23 0037   midodrine (PROAMATINE) tablet 2.5 mg  2.5 mg Oral TID WC Crosley, Debby, MD   2.5 mg at 02/28/23 1118   nicotine (NICODERM CQ - dosed in mg/24 hours) patch 14 mg  14 mg Transdermal Daily Crosley, Debby, MD   14 mg at 02/26/23 2247   ondansetron (ZOFRAN) tablet 4 mg  4 mg Oral Q6H PRN Crosley, Debby, MD       Or   ondansetron (ZOFRAN) injection 4 mg  4 mg Intravenous Q6H PRN Crosley, Debby, MD       paliperidone (INVEGA) 24 hr tablet 3 mg  3 mg Oral QHS  Augusto Gamble, MD       QUEtiapine (SEROQUEL) tablet 25 mg  25 mg Oral BID Augusto Gamble, MD   25 mg at 02/28/23 1119   senna-docusate (Senokot-S) tablet 1 tablet  1 tablet Oral QHS PRN Crosley, Debby, MD       sertraline (ZOLOFT) tablet 50 mg  50 mg Oral Daily Crosley, Debby, MD   50 mg at 02/28/23 1119     Discharge Medications: Please see discharge summary for a list of discharge medications.  Relevant Imaging Results:  Relevant Lab Results:   Additional Information SSN: [id number]  Renne Crigler Ellenore Roscoe, LCSW

## 2023-02-28 NOTE — Consult Note (Addendum)
[facility] Psychiatry Consult Note   Service Date: February 28, 2023 LOS:  LOS: 2 days  MRN: [id number] Type of consult:  Followup   Primary Psychiatric Diagnoses  Delirium due to multiple etiologies, acute, mixed Hx schizoaffective disorder, bipolar type  Assessment  Dustin [name] is a 67 y.o. male with a past psychiatric history of schizoaffective vs bipolar disorder, substance use history of tobacco use disorder, and significant PMHx of chronic HTN . Psychiatry was consulted for "presented intoxicated, had suicidal ideations" by Benita Gutter, DO  On initial evaluation, the patient demonstrated a disturbance in attention accompanied by reduced awareness of the environment that developed over a short period of time (hours to days) and represents a change in baseline attention and awareness, and tends to fluctuate in severity during the course of a day. There is also a notable disturbance in cognition, namely disorientation and perception. The aforementioned disturbances have lasted for days and are not better explained by another pre-existing, established, or evolving neurocognitive disorder/s; they also do not occur in the context of a severely reduced level of arousal, such as coma. Lastly, there is evidence from the history, physical examination, or laboratory findings that the disturbance observed is a direct physiological consequence of other medical condition/s, including sepsis secondary to UTI / pneumonia. The delirium presents with level of psychomotor activity that rapidly fluctutates. His current presentation of altered mental status is most consistent with delirium due to multiple etiologies, acute, mixed.  Over the past several days, patient was observed to be fluctuating between periods of agitation, paranoia + psychosis and being pleasantly disoriented, tangential. Observation over past few days supports mixed picture of delirium.  Unclear at this time as to what history of  schizoaffective disorder is playing a role into patient's current presentation as on chart review, patient has not received his LAI paliperidone for the month of September. Given his ongoing medical issues, still favor delirium to explain perceptual disturbances and agitation. Patient will benefit from adjustment of his antipsychotic regimen (see below)  Diagnoses:  Active Hospital problems: Principal Problem:   Sepsis secondary to UTI [facility]) Active Problems:   Tobacco use disorder   Schizoaffective disorder, bipolar type (HCC)   Schizophrenia (HCC)   Sinus bradycardia   Hypothermia   Altered mental status   Aggressive behavior   Urinary tract infection without hematuria   Metabolic encephalopathy    Plan and Recommendations  ## Interventions (medications, psychoeducation, etc):  -- start paliperidone 3 mg daily at bedtime for schizoaffective disorder with goal of transitioning to LAI -- continue home quetiapine 25 mg twice daily -- continue home sertraline 50 mg daily -- continue haloperidol 5 mg IV every 6 hours as needed for agitation -- treatment of underlying medical conditions (UTI, pneumonia) per primary team to address root causes of delirium  ## Further Work-up:  -- none  -- most recent EKG on 02/27/2023 had QtC of 466 -- Pertinent labwork reviewed earlier this admission includes: CMP, CBC w/ diff, and CXR  ## Medical Decision Making Capacity:  -- Not formally assessed during this encounter  ## Disposition:  -- No indication for inpatient psychiatric admission. Recommending outpatient psychiatry / psychology follow-up. Defer immediate disposition plan to primary team.  ## Behavioral / Environmental:  -- Standard delirium precautions and Fall precautions  ##Legal Status -- Patient voluntary  Thank you for this consult request. Our recommendations are listed above.  We will continue to follow patient's hospital course  ## Safety and Observation Level:  -  Based on  my clinical evaluation, I estimate the patient to be at low risk of self harm in the current setting - At this time, we recommend a no additional level of observation at present, though threshold to provide patient with 1-to-1 sitter should be low given aggression on presentation. This decision is based on my review of the chart including patient's history and current presentation, interview of the patient, mental status examination, and consideration of suicide risk including evaluating suicidal ideation, plan, intent, suicidal or self-harm behaviors, risk factors, and protective factors. This judgment is based on our ability to directly address suicide risk, implement suicide prevention strategies and develop a safety plan while the patient is in the clinical setting. Please contact our team if there is a concern that risk level has changed.  Augusto Gamble, MD  History Obtained on Initial Interview  Relevant Aspects of Hospital Course:  Admitted on 02/26/2023 for mental status change (combative, swinging at nursing home staff).  Patient Report:  9/24 Patient was found to be very somnolent but did rouse to voice. He was oriented to self and location but not to situation ("I got hit in the stomach") or time ("I don't care about that").  When asked if patient had any thoughts of wanting to hurt himself, patient denies. When asked if he has thoughts of hurting other people, he endorses. I asked patient who he wants to hurt and he responds, "a lot of people." He was unable to give me any specific names.  When asked if patient could perform months of the year backwards, he says, "leave me alone right now - I got a bunch of retired people, my brothers - come find me." He then continued to mumble unintelligibly until he dozed off to sleep.  I terminated interview as patient became increasingly irritable and refused to answer further questions.  9/25 As I entered the room, Patient today not oriented to  time. He says he cannot count months of the year backwards ("I'm bad at this"). When asked how he slept, patient says, "not well. I'm in a strange place."  During the interview patient became tangential. He says: "My friend is attractive even though they handicapped" "Some people don't like me, some people do," said with a smile  When asked if patient hears voices, he says, "there's no such thing." He denies SI, HI, or VH.  As I was exiting the room, patient continued to ramble unintelligibly.  Collateral information obtained 02/28/2023 [facility] [place], patient's brother - limited guardian)  I spoke to patient's brother who gave consent for [place] to treat patient's schizoaffective disorder with paliperidone - [name] gave [place] consent.  He shares that this is not the first time patient has acted this way (describes delirium symptoms). Ultimately he wants patient to go back to [facility].  [name] asked me how much longer patient is going to be in the hospital and I told him this is a question the primary team can address better.  During this conversation, I explained in simple terms the patient's mental health condition, answered questions pertaining to the patient's current treatment and provided updates, and outlined the treatment plan moving forward.  Psychiatric and Social History   Psychiatric History  Information collected from patient and available chart review  Prev dx/sx: schizoaffective disorder, bipolar type Current Psych Provider: unknown Current Psych Meds: sertraline, quetiapine Past Psych Meds: haloperidol LAI, paliperidone LAI  Family psych history: not obtained  Social History:  Living situation: permanent nursing home  Substance Use History: Not obtained  Other Histories  These are pulled from EMR and updated if appropriate.   Family History:  The patient's family history includes Hypertension in his brother, brother, and brother; Lung cancer in his father;  Prostate cancer in his brother.  Medical History: Past Medical History:  Diagnosis Date   Schizophrenia [facility])     Surgical History: Past Surgical History:  Procedure Laterality Date   COLON SURGERY     COLONOSCOPY N/A 10/06/2021   Procedure: COLONOSCOPY;  Surgeon: Toledo, Boykin Nearing, MD;  Location: [facility] ENDOSCOPY;  Service: Gastroenterology;  Laterality: N/A;   ESOPHAGOGASTRODUODENOSCOPY N/A 10/06/2021   Procedure: ESOPHAGOGASTRODUODENOSCOPY (EGD);  Surgeon: Toledo, Boykin Nearing, MD;  Location: [facility] ENDOSCOPY;  Service: Gastroenterology;  Laterality: N/A;   PARTIAL COLECTOMY  10/13/2021   Procedure: RIGHT COLECTOMY;  Surgeon: Leafy Ro, MD;  Location: [facility] ORS;  Service: General;;    Medications:   Current Facility-Administered Medications:    acetaminophen (TYLENOL) tablet 650 mg, 650 mg, Oral, Q6H PRN **OR** acetaminophen (TYLENOL) suppository 650 mg, 650 mg, Rectal, Q6H PRN, Crosley, Debby, MD   cefTRIAXone (ROCEPHIN) 1 g in sodium chloride 0.9 % 100 mL IVPB, 1 g, Intravenous, Q24H, Crosley, Debby, MD, Last Rate: 200 mL/hr at 02/27/23 2104, 1 g at 02/27/23 2104   doxycycline (VIBRAMYCIN) 100 mg in sodium chloride 0.9 % 250 mL IVPB, 100 mg, Intravenous, Q12H, Crosley, Debby, MD, Last Rate: 125 mL/hr at 02/27/23 2200, 100 mg at 02/27/23 2200   enoxaparin (LOVENOX) injection 40 mg, 40 mg, Subcutaneous, Q24H, Crosley, Debby, MD   haloperidol lactate (HALDOL) injection 5 mg, 5 mg, Intravenous, Q6H PRN, Joneen Roach, Debby, MD, 5 mg at 02/27/23 0037   midodrine (PROAMATINE) tablet 2.5 mg, 2.5 mg, Oral, TID WC, Crosley, Debby, MD, 2.5 mg at 02/27/23 1622   nicotine (NICODERM CQ - dosed in mg/24 hours) patch 14 mg, 14 mg, Transdermal, Daily, Crosley, Debby, MD, 14 mg at 02/26/23 2247   ondansetron (ZOFRAN) tablet 4 mg, 4 mg, Oral, Q6H PRN **OR** ondansetron (ZOFRAN) injection 4 mg, 4 mg, Intravenous, Q6H PRN, Crosley, Debby, MD   paliperidone (INVEGA) 24 hr tablet 3 mg, 3 mg, Oral, Daily, Augusto Gamble, MD   QUEtiapine (SEROQUEL) tablet 25 mg, 25 mg, Oral, BID, Augusto Gamble, MD   senna-docusate (Senokot-S) tablet 1 tablet, 1 tablet, Oral, QHS PRN, Joneen Roach, Debby, MD   sertraline (ZOLOFT) tablet 50 mg, 50 mg, Oral, Daily, Crosley, Debby, MD  Allergies: No Known Allergies  Exam Findings  Vital signs:  Temp:  [97.4 F (36.3 C)-97.9 F (36.6 C)] 97.9 F (36.6 C) (09/25 0400) Pulse Rate:  [55-74] 55 (09/25 0400) Resp:  [13-16] 14 (09/25 0400) BP: (98-138)/(74-98) 115/74 (09/25 0400) SpO2:  [95 %-96 %] 95 % (09/24 2025)  Psychiatric Specialty Exam:  Presentation  General Appearance: Casual  Eye Contact:Fair  Speech:Garbled  Speech Volume:Decreased  Handedness:-- (not assessed)   Mood and Affect  Mood:-- ("I'm good")  Affect:Appropriate; Full Range   Thought Process  Thought Processes:Irrevelant (tangential)  Descriptions of Associations:Loose  Orientation:Full (Time, Place and Person)  Thought Content:Illogical; Scattered  History of Schizophrenia/Schizoaffective disorder:Yes  Duration of Psychotic Symptoms:Greater than six months  Hallucinations:Hallucinations: None  Ideas of Reference:None  Suicidal Thoughts:Suicidal Thoughts: No  Homicidal Thoughts:Homicidal Thoughts: No HI Passive Intent and/or Plan: Without Intent   Sensorium  Memory:Recent Poor  Judgment:Fair  Insight:Lacking   Executive Functions  Concentration:Poor  Attention Span:Poor  Recall:Poor  Fund of Knowledge:Poor  Language:Poor   Psychomotor Activity  Psychomotor Activity:Psychomotor Activity: Decreased   Assets  Assets:Resilience   Sleep  Sleep:Sleep: Poor   Physical Exam: Physical Exam Vitals and nursing note reviewed.  Constitutional:      Comments: somnolent  HENT:     Head: Normocephalic and atraumatic.  Pulmonary:     Effort: Pulmonary effort is normal.   Blood pressure 115/74, pulse (!) 55, temperature 97.9 F (36.6 C), temperature  source Oral, resp. rate 14, height 5\' 7"  (1.702 m), weight 56.2 kg, SpO2 95%. Body mass index is 19.42 kg/m.

## 2023-02-28 NOTE — Plan of Care (Signed)
  Problem: Clinical Measurements: Goal: Will remain free from infection Outcome: Progressing Goal: Respiratory complications will improve Outcome: Progressing Goal: Cardiovascular complication will be avoided Outcome: Progressing   Problem: Nutrition: Goal: Adequate nutrition will be maintained Outcome: Progressing   Problem: Elimination: Goal: Will not experience complications related to bowel motility Outcome: Progressing   Problem: Pain Managment: Goal: General experience of comfort will improve Outcome: Progressing   Problem: Safety: Goal: Ability to remain free from injury will improve Outcome: Progressing

## 2023-02-28 NOTE — TOC Initial Note (Signed)
Transition of Care [facility]) - Initial/Assessment Note    Patient Details  Name: Dustin [name] MRN: [id number] Date of Birth: 12-May-1956  Transition of Care [facility]) CM/SW Contact:    Mearl Latin, LCSW Phone Number: 02/28/2023, 2:03 PM  Clinical Narrative:                 Patient was admitted from [facility] where he is a resident. CSW updated [facility] and plan will be for him to return at discharge.   Expected Discharge Plan: Skilled Nursing Facility Barriers to Discharge: Continued Medical Work up   Patient Goals and CMS Choice Patient states their goals for this hospitalization and ongoing recovery are:: Return to SNF          Expected Discharge Plan and Services In-house Referral: Clinical Social Work   Post Acute Care Choice: Skilled Nursing Facility Living arrangements for the past 2 months: Skilled Nursing Facility                                      Prior Living Arrangements/Services Living arrangements for the past 2 months: Skilled Nursing Facility Lives with:: Facility Resident Patient language and need for interpreter reviewed:: Yes Do you feel safe going back to the place where you live?: Yes      Need for Family Participation in Patient Care: Yes (Comment) Care giver support system in place?: Yes (comment)   Criminal Activity/Legal Involvement Pertinent to Current Situation/Hospitalization: No - Comment as needed  Activities of Daily Living      Permission Sought/Granted Permission sought to share information with : Facility Medical sales representative, Family Supports Permission granted to share information with : No  Share Information with NAME: [name]  Permission granted to share info w AGENCY: [facility]  Permission granted to share info w Relationship: Brother  Permission granted to share info w Contact Information: [phone]  Emotional Assessment Appearance:: Appears stated age Attitude/Demeanor/Rapport: Unable to  Assess Affect (typically observed): Unable to Assess Orientation: : Oriented to Self Alcohol / Substance Use: Not Applicable Psych Involvement: Yes (comment) (psych med changes)  Admission diagnosis:  Metabolic encephalopathy [G93.41] Sinus bradycardia [R00.1] Sepsis secondary to UTI (HCC) [A41.9, N39.0] Urinary tract infection without hematuria, site unspecified [N39.0] Patient Active Problem List   Diagnosis Date Noted   Urinary tract infection without hematuria 02/27/2023   Metabolic encephalopathy 02/27/2023   Sinus bradycardia 02/26/2023   Hypothermia 02/26/2023   Altered mental status 02/26/2023   Aggressive behavior 02/26/2023   History of colon cancer, stage I 02/09/2023   Bacteremia 11/12/2022   Sepsis secondary to UTI (HCC) 11/10/2022   Pressure injury of skin 11/10/2022   Protein-calorie malnutrition, severe 07/22/2022   COVID-19 virus infection 07/20/2022   Hypotension 07/20/2022   Aspiration pneumonia (HCC) 07/20/2022   Adenocarcinoma of colon (HCC) 12/13/2021   Malignant neoplasm of ascending colon (HCC) 11/08/2021   Family history of cancer 11/08/2021   IDA (iron deficiency anemia)    Colonic mass 10/06/2021   Microcytic anemia 10/05/2021   Syncope 10/05/2021   Symptomatic anemia    Schizophrenia (HCC) 07/12/2018   Schizoaffective disorder, bipolar type (HCC) 01/24/2017   Tobacco use disorder 11/29/2016   PCP:  Marcine Matar, MD Pharmacy:   [facility] [address] ([address]), [place] - [address] [place] ([place]) [place] [address] Phone: [phone] Fax: [phone]  [facility] - [place], [place] - [address]  [address] [place] [address] Phone: [phone] Fax: [phone]     Social Determinants of Health (SDOH) Social History: SDOH Screenings   Housing: High Risk (07/21/2022)  Alcohol Screen: Low Risk  (03/06/2022)  Depression (PHQ2-9): Low Risk  (02/09/2023)  Tobacco Use: High Risk  (02/26/2023)   SDOH Interventions:     Readmission Risk Interventions     No data to display

## 2023-02-28 NOTE — Progress Notes (Signed)
PROGRESS NOTE        PATIENT DETAILS Name: Dustin [name] Age: 67 y.o. Sex: male Date of Birth: 1955-08-08 Admit Date: 02/26/2023 Admitting Physician Gery Pray, MD ZOX:[id number], Binnie Rail, MD  Brief Summary: Patient is a 67 y.o.  male with history of schizoaffective disorder versus bipolar disorder-who was transferred from SNF for combative behavior.  Significant events: 9/23>> [facility] to [facility] [address]  Significant studies: 9/23>> CXR: Emphysema/bronchitis changes\-but no infiltrates.  Significant microbiology data: 9/23>> respiratory virus panel: Negative 9/23>> blood culture: No growth 9/23>> urine culture: E. coli/group B strep  Procedures: None  Consults: Psychiatry Cardiology  Subjective: Very suspicious-refused examination-thinks he is being "experimented upon".  Objective: Vitals: Blood pressure 115/74, pulse (!) 55, temperature 97.9 F (36.6 C), temperature source Oral, resp. rate 14, height 5\' 7"  (1.702 m), weight 56.2 kg, SpO2 95%.   Exam: Gen Exam:not in any distress-speech clear Moving all 4 ext symmetrically Refused rest of exam  Pertinent Labs/Radiology:    Latest Ref Rng & Units 02/28/2023    3:53 AM 02/27/2023    3:36 AM 02/26/2023    6:28 PM  CBC  WBC 4.0 - 10.5 K/uL 5.6  5.8  4.3   Hemoglobin 13.0 - 17.0 g/dL 09.8  11.9  14.7   Hematocrit 39.0 - 52.0 % 38.8  37.4  37.7   Platelets 150 - 400 K/uL 150  PLATELET CLUMPS NOTED ON SMEAR, UNABLE TO ESTIMATE  181     Lab Results  Component Value Date   NA 140 02/28/2023   K 4.2 02/28/2023   CL 108 02/28/2023   CO2 22 02/28/2023      Assessment/Plan: Complicated UTI Doubt sepsis physiology on admission -ruled out Nontoxic-appearing-afebrile-no leukocytosis Continue Rocephin-await sensitivity]  Altered mental status-paranoid behavior  History of schizoaffective disorder Claims he is being "experimented upon"today I suspect this is related to underlying psych issues  rather than a UTI Seroquel/Zoloft Psych following  Bradycardia Mostly resolved-at times very mild Thought to be due to sedative medications that he received in the ED Recent echo June 2024-stable TSH stable  Hypotension Mostly chronic-mild-asymptomatic On midodrine.  Tobacco abuse Will need to be counseled prior to discharge-when mental status allows.  History of colon cancer Outpatient follow-up with PCP/oncology  BMI: Estimated body mass index is 19.42 kg/m as calculated from the following:   Height as of this encounter: 5\' 7"  (1.702 m).   Weight as of this encounter: 56.2 kg.   Code status:   Code Status: Full Code   DVT Prophylaxis: enoxaparin (LOVENOX) injection 40 mg Start: 02/26/23 2245   Family Communication: None at bedside   Disposition Plan: Status is: Inpatient Remains inpatient appropriate because: Severity of illness   Planned Discharge Destination:Skilled nursing facility   Diet: Diet Order             Diet Heart Room service appropriate? Yes; Fluid consistency: Thin  Diet effective now                     Antimicrobial agents: Anti-infectives (From admission, onward)    Start     Dose/Rate Route Frequency Ordered Stop   02/27/23 1900  cefTRIAXone (ROCEPHIN) 1 g in sodium chloride 0.9 % 100 mL IVPB        1 g 200 mL/hr over 30 Minutes Intravenous Every 24 hours 02/26/23 2130  02/26/23 2130  doxycycline (VIBRAMYCIN) 100 mg in sodium chloride 0.9 % 250 mL IVPB        100 mg 125 mL/hr over 120 Minutes Intravenous Every 12 hours 02/26/23 2129     02/26/23 1945  cefTRIAXone (ROCEPHIN) 2 g in sodium chloride 0.9 % 100 mL IVPB        2 g 200 mL/hr over 30 Minutes Intravenous  Once 02/26/23 1934 02/26/23 2059        MEDICATIONS: Scheduled Meds:  enoxaparin (LOVENOX) injection  40 mg Subcutaneous Q24H   midodrine  2.5 mg Oral TID WC   nicotine  14 mg Transdermal Daily   paliperidone  3 mg Oral Daily   QUEtiapine  25 mg Oral BID    sertraline  50 mg Oral Daily   Continuous Infusions:  cefTRIAXone (ROCEPHIN)  IV 1 g (02/27/23 2104)   doxycycline (VIBRAMYCIN) IV 100 mg (02/27/23 2200)   PRN Meds:.acetaminophen **OR** acetaminophen, haloperidol lactate, ondansetron **OR** ondansetron (ZOFRAN) IV, senna-docusate   I have personally reviewed following labs and imaging studies  LABORATORY DATA: CBC: Recent Labs  Lab 02/26/23 1828 02/27/23 0336 02/28/23 0353  WBC 4.3 5.8 5.6  NEUTROABS 2.9 4.5  --   HGB 11.6* 12.0* 11.8*  HCT 37.7* 37.4* 38.8*  MCV 90.0 89.9 90.4  PLT 181 PLATELET CLUMPS NOTED ON SMEAR, UNABLE TO ESTIMATE 150    Basic Metabolic Panel: Recent Labs  Lab 02/26/23 1828 02/27/23 0336 02/28/23 0353  NA 138 138 140  K 3.9 4.3 4.2  CL 105 108 108  CO2 26 23 22   GLUCOSE 111* 79 74  BUN 10 10 14   CREATININE 0.99 0.92 1.09  CALCIUM 9.2 9.3 9.3  MG 1.9 1.8  --     GFR: Estimated Creatinine Clearance: 53 mL/min (by C-G formula based on SCr of 1.09 mg/dL).  Liver Function Tests: Recent Labs  Lab 02/26/23 1828  AST 17  ALT 13  ALKPHOS 91  BILITOT 0.4  PROT 6.4*  ALBUMIN 3.1*   No results for input(s): "LIPASE", "AMYLASE" in the last 168 hours. No results for input(s): "AMMONIA" in the last 168 hours.  Coagulation Profile: No results for input(s): "INR", "PROTIME" in the last 168 hours.  Cardiac Enzymes: No results for input(s): "CKTOTAL", "CKMB", "CKMBINDEX", "TROPONINI" in the last 168 hours.  BNP (last 3 results) No results for input(s): "PROBNP" in the last 8760 hours.  Lipid Profile: No results for input(s): "CHOL", "HDL", "LDLCALC", "TRIG", "CHOLHDL", "LDLDIRECT" in the last 72 hours.  Thyroid Function Tests: Recent Labs    02/26/23 1829  TSH 0.890    Anemia Panel: No results for input(s): "VITAMINB12", "FOLATE", "FERRITIN", "TIBC", "IRON", "RETICCTPCT" in the last 72 hours.  Urine analysis:    Component Value Date/Time   COLORURINE YELLOW 02/26/2023 1852    APPEARANCEUR HAZY (A) 02/26/2023 1852   APPEARANCEUR Hazy 05/14/2013 1123   LABSPEC 1.013 02/26/2023 1852   LABSPEC 1.025 05/14/2013 1123   PHURINE 5.0 02/26/2023 1852   GLUCOSEU NEGATIVE 02/26/2023 1852   GLUCOSEU Negative 05/14/2013 1123   HGBUR SMALL (A) 02/26/2023 1852   BILIRUBINUR NEGATIVE 02/26/2023 1852   BILIRUBINUR small 10/03/2021 1026   BILIRUBINUR Negative 05/14/2013 1123   KETONESUR NEGATIVE 02/26/2023 1852   PROTEINUR NEGATIVE 02/26/2023 1852   UROBILINOGEN negative (A) 10/03/2021 1026   NITRITE POSITIVE (A) 02/26/2023 1852   LEUKOCYTESUR LARGE (A) 02/26/2023 1852   LEUKOCYTESUR Negative 05/14/2013 1123    Sepsis Labs: Lactic Acid, Venous    Component  Value Date/Time   LATICACIDVEN 1.3 02/26/2023 1913    MICROBIOLOGY: Recent Results (from the past 240 hour(s))  Culture, blood (routine x 2)     Status: None (Preliminary result)   Collection Time: 02/26/23  6:29 PM   Specimen: BLOOD  Result Value Ref Range Status   Specimen Description BLOOD SITE NOT SPECIFIED  Final   Special Requests   Final    BOTTLES DRAWN AEROBIC AND ANAEROBIC Blood Culture results may not be optimal due to an excessive volume of blood received in culture bottles   Culture   Final    NO GROWTH 2 DAYS Performed at [facility], [address], [place], [place] [address]    Report Status PENDING  Incomplete  Culture, blood (routine x 2)     Status: None (Preliminary result)   Collection Time: 02/26/23  6:48 PM   Specimen: BLOOD  Result Value Ref Range Status   Specimen Description BLOOD SITE NOT SPECIFIED  Final   Special Requests   Final    BOTTLES DRAWN AEROBIC AND ANAEROBIC Blood Culture adequate volume   Culture   Final    NO GROWTH 2 DAYS Performed at [facility], [address], [place], [place] [address]    Report Status PENDING  Incomplete  Urine Culture     Status: Abnormal (Preliminary result)   Collection Time: 02/26/23  6:52 PM   Specimen: Urine, Random   Result Value Ref Range Status   Specimen Description URINE, RANDOM  Final   Special Requests NONE Reflexed from [id number]  Final   Culture (A)  Final    >=100,000 COLONIES/mL ESCHERICHIA COLI >=100,000 COLONIES/mL GROUP B STREP(S.AGALACTIAE)ISOLATED TESTING AGAINST S. AGALACTIAE NOT ROUTINELY PERFORMED DUE TO PREDICTABILITY OF AMP/PEN/VAN SUSCEPTIBILITY. Performed at [facility], [address] [place], [place] [address]    Report Status PENDING  Incomplete  Respiratory (~20 pathogens) panel by PCR     Status: None   Collection Time: 02/27/23  3:46 AM   Specimen: Nasopharyngeal Swab; Respiratory  Result Value Ref Range Status   Adenovirus NOT DETECTED NOT DETECTED Final   Coronavirus 229E NOT DETECTED NOT DETECTED Final    Comment: (NOTE) The Coronavirus on the Respiratory Panel, DOES NOT test for the novel  Coronavirus (2019 nCoV)    Coronavirus HKU1 NOT DETECTED NOT DETECTED Final   Coronavirus NL63 NOT DETECTED NOT DETECTED Final   Coronavirus OC43 NOT DETECTED NOT DETECTED Final   Metapneumovirus NOT DETECTED NOT DETECTED Final   Rhinovirus / Enterovirus NOT DETECTED NOT DETECTED Final   Influenza A NOT DETECTED NOT DETECTED Final   Influenza B NOT DETECTED NOT DETECTED Final   Parainfluenza Virus 1 NOT DETECTED NOT DETECTED Final   Parainfluenza Virus 2 NOT DETECTED NOT DETECTED Final   Parainfluenza Virus 3 NOT DETECTED NOT DETECTED Final   Parainfluenza Virus 4 NOT DETECTED NOT DETECTED Final   Respiratory Syncytial Virus NOT DETECTED NOT DETECTED Final   Bordetella pertussis NOT DETECTED NOT DETECTED Final   Bordetella Parapertussis NOT DETECTED NOT DETECTED Final   Chlamydophila pneumoniae NOT DETECTED NOT DETECTED Final   Mycoplasma pneumoniae NOT DETECTED NOT DETECTED Final    Comment: Performed at [facility], [address], [place], [place] [address]    RADIOLOGY STUDIES/RESULTS: DG Chest Portable 1 View  Result Date: 02/26/2023 CLINICAL DATA:   Altered mental status EXAM: PORTABLE CHEST 1 VIEW COMPARISON:  11/10/2022 FINDINGS: Marked patient rotation. Emphysema and diffuse bronchitic changes.  Slight increased interstitial and ground-glass opacity in the left mid lung and right base, difficult to exclude acute superimposed process. No pneumothorax IMPRESSION: Emphysema and bronchitic changes. Slight increased interstitial and ground-glass opacity in the left mid lung and right base, difficult to exclude acute superimposed process/pneumonia. Electronically Signed   By: Jasmine Pang M.D.   On: 02/26/2023 20:34     LOS: 2 days   Jeoffrey Massed, MD  [facility]    To contact the attending provider between 7A-7P or the covering provider during after hours 7P-7A, please log into the web site [email or web address] and access using universal [place] password for that web site. If you do not have the password, please call the hospital operator.  02/28/2023, 12:26 PM

## 2023-03-01 DIAGNOSIS — G9341 Metabolic encephalopathy: Secondary | ICD-10-CM | POA: Diagnosis not present

## 2023-03-01 DIAGNOSIS — F17200 Nicotine dependence, unspecified, uncomplicated: Secondary | ICD-10-CM | POA: Diagnosis not present

## 2023-03-01 DIAGNOSIS — R001 Bradycardia, unspecified: Secondary | ICD-10-CM | POA: Diagnosis not present

## 2023-03-01 DIAGNOSIS — N390 Urinary tract infection, site not specified: Secondary | ICD-10-CM | POA: Diagnosis not present

## 2023-03-01 LAB — CULTURE, BLOOD (ROUTINE X 2): Special Requests: ADEQUATE

## 2023-03-01 LAB — URINE CULTURE: Culture: >=100000 — AB

## 2023-03-01 MED ORDER — PALIPERIDONE ER 3 MG PO TB24
3.0000 mg | ORAL_TABLET | Freq: Every day | ORAL | Status: DC
  Administered 2023-03-02: 3 mg via ORAL
  Filled 2023-03-01 (×2): qty 1

## 2023-03-01 MED ORDER — AMOXICILLIN 500 MG PO CAPS
500.0000 mg | ORAL_CAPSULE | Freq: Three times a day (TID) | ORAL | Status: DC
  Administered 2023-03-01 – 2023-03-02 (×2): 500 mg via ORAL
  Filled 2023-03-01 (×3): qty 1

## 2023-03-01 MED ORDER — PALIPERIDONE PALMITATE ER 234 MG/1.5ML IM SUSY: 234.0000 mg | PREFILLED_SYRINGE | INTRAMUSCULAR | Status: DC

## 2023-03-01 MED ORDER — PALIPERIDONE PALMITATE ER 156 MG/ML IM SUSY
156.0000 mg | PREFILLED_SYRINGE | Freq: Once | INTRAMUSCULAR | Status: AC
  Administered 2023-03-02: 156 mg via INTRAMUSCULAR
  Filled 2023-03-01 (×2): qty 1

## 2023-03-01 MED ORDER — PALIPERIDONE PALMITATE ER 156 MG/ML IM SUSY: 156.0000 mg | PREFILLED_SYRINGE | Freq: Once | INTRAMUSCULAR | Status: DC

## 2023-03-01 NOTE — Plan of Care (Signed)

## 2023-03-01 NOTE — Progress Notes (Signed)
Arrived to start patient IV, patient stated he didn't want me to start an IV, he was ready to go home. Primary RN made aware.

## 2023-03-01 NOTE — Progress Notes (Signed)
IM injection medication ordered for patient.  Patient refused med when this RN attempted to administer.

## 2023-03-01 NOTE — Progress Notes (Signed)
Patient removed IVA. Refuse to have another one placed at this time. Will put order to get assist from IV team.

## 2023-03-01 NOTE — Progress Notes (Signed)
PROGRESS NOTE        PATIENT DETAILS Name: Dustin [name] Age: 67 y.o. Sex: male Date of Birth: 12/14/1955 Admit Date: 02/26/2023 Admitting Physician Gery Pray, MD [id number], Binnie Rail, MD  Brief Summary: Patient is a 67 y.o.  male with history of schizoaffective disorder versus bipolar disorder-who was transferred from SNF for combative behavior.  Significant events: 9/23>> admit to [facility]  Significant studies: 9/23>> CXR: Emphysema/bronchitis changes\-but no infiltrates.  Significant microbiology data: 9/23>> respiratory virus panel: Negative 9/23>> blood culture: No growth 9/23>> urine culture: E. coli/group B strep  Procedures: None  Consults: Psychiatry Cardiology  Subjective: Still very paranoid-thinks he is being experimented on-does not want me to be doing "surgeries on him".  Refuses to let me examine him.  Note-rounded with bedside RN.  Objective: Vitals: Blood pressure (!) 91/58, pulse (!) 50, temperature 97.9 F (36.6 C), temperature source Oral, resp. rate 12, height 5\' 7"  (1.702 m), weight 56.2 kg, SpO2 97%.   Exam: General Exam: Speech clear-somewhat pressured at times-not in any distress Refused rest of the exam-Per RN-moving all 4 extremities.  Pertinent Labs/Radiology:    Latest Ref Rng & Units 02/28/2023    3:53 AM 02/27/2023    3:36 AM 02/26/2023    6:28 PM  CBC  WBC 4.0 - 10.5 K/uL 5.6  5.8  4.3   Hemoglobin 13.0 - 17.0 g/dL 56.2  13.0  86.5   Hematocrit 39.0 - 52.0 % 38.8  37.4  37.7   Platelets 150 - 400 K/uL 150  PLATELET CLUMPS NOTED ON SMEAR, UNABLE TO ESTIMATE  181     Lab Results  Component Value Date   NA 140 02/28/2023   K 4.2 02/28/2023   CL 108 02/28/2023   CO2 22 02/28/2023      Assessment/Plan: Complicated UTI Sepsis ruled out-clinically improved  Switch to amoxicillin today  Altered mental status-paranoid behavior  History of schizoaffective disorder Continues to be paranoid-at times  delusional  This is clearly not from underlying UTI-and this is certainly from his underlying psych issues.   Appreciate psychiatry input-medications being adjusted by psychiatry team.  Bradycardia Mostly resolved-at times very mild Thought to be due to sedative medications that he received in the ED Recent echo June 2024-stable TSH stable  Hypotension Mostly chronic-mild-asymptomatic On midodrine.  Tobacco abuse Will need to be counseled prior to discharge-when mental status allows.  History of colon cancer Outpatient follow-up with PCP/oncology  BMI: Estimated body mass index is 19.42 kg/m as calculated from the following:   Height as of this encounter: 5\' 7"  (1.702 m).   Weight as of this encounter: 56.2 kg.   Code status:   Code Status: Full Code   DVT Prophylaxis: enoxaparin (LOVENOX) injection 40 mg Start: 02/26/23 2245   Family Communication: None at bedside   Disposition Plan: Status is: Inpatient Remains inpatient appropriate because: Severity of illness   Planned Discharge Destination:Skilled nursing facility   Diet: Diet Order             Diet Heart Room service appropriate? Yes; Fluid consistency: Thin  Diet effective now                     Antimicrobial agents: Anti-infectives (From admission, onward)    Start     Dose/Rate Route Frequency Ordered Stop   02/27/23 1900  cefTRIAXone (ROCEPHIN) 1 g in sodium chloride 0.9 % 100 mL IVPB        1 g 200 mL/hr over 30 Minutes Intravenous Every 24 hours 02/26/23 2130     02/26/23 2130  doxycycline (VIBRAMYCIN) 100 mg in sodium chloride 0.9 % 250 mL IVPB        100 mg 125 mL/hr over 120 Minutes Intravenous Every 12 hours 02/26/23 2129     02/26/23 1945  cefTRIAXone (ROCEPHIN) 2 g in sodium chloride 0.9 % 100 mL IVPB        2 g 200 mL/hr over 30 Minutes Intravenous  Once 02/26/23 1934 02/26/23 2059        MEDICATIONS: Scheduled Meds:  enoxaparin (LOVENOX) injection  40 mg Subcutaneous  Q24H   midodrine  2.5 mg Oral TID WC   nicotine  14 mg Transdermal Daily   paliperidone  3 mg Oral QHS   QUEtiapine  25 mg Oral BID   sertraline  50 mg Oral Daily   Continuous Infusions:  cefTRIAXone (ROCEPHIN)  IV 1 g (02/28/23 2301)   doxycycline (VIBRAMYCIN) IV Stopped (03/01/23 0019)   PRN Meds:.acetaminophen **OR** acetaminophen, haloperidol lactate, ondansetron **OR** ondansetron (ZOFRAN) IV, senna-docusate   I have personally reviewed following labs and imaging studies  LABORATORY DATA: CBC: Recent Labs  Lab 02/26/23 1828 02/27/23 0336 02/28/23 0353  WBC 4.3 5.8 5.6  NEUTROABS 2.9 4.5  --   HGB 11.6* 12.0* 11.8*  HCT 37.7* 37.4* 38.8*  MCV 90.0 89.9 90.4  PLT 181 PLATELET CLUMPS NOTED ON SMEAR, UNABLE TO ESTIMATE 150    Basic Metabolic Panel: Recent Labs  Lab 02/26/23 1828 02/27/23 0336 02/28/23 0353  NA 138 138 140  K 3.9 4.3 4.2  CL 105 108 108  CO2 26 23 22   GLUCOSE 111* 79 74  BUN 10 10 14   CREATININE 0.99 0.92 1.09  CALCIUM 9.2 9.3 9.3  MG 1.9 1.8  --     GFR: Estimated Creatinine Clearance: 53 mL/min (by C-G formula based on SCr of 1.09 mg/dL).  Liver Function Tests: Recent Labs  Lab 02/26/23 1828  AST 17  ALT 13  ALKPHOS 91  BILITOT 0.4  PROT 6.4*  ALBUMIN 3.1*   No results for input(s): "LIPASE", "AMYLASE" in the last 168 hours. No results for input(s): "AMMONIA" in the last 168 hours.  Coagulation Profile: No results for input(s): "INR", "PROTIME" in the last 168 hours.  Cardiac Enzymes: No results for input(s): "CKTOTAL", "CKMB", "CKMBINDEX", "TROPONINI" in the last 168 hours.  BNP (last 3 results) No results for input(s): "PROBNP" in the last 8760 hours.  Lipid Profile: No results for input(s): "CHOL", "HDL", "LDLCALC", "TRIG", "CHOLHDL", "LDLDIRECT" in the last 72 hours.  Thyroid Function Tests: Recent Labs    02/26/23 1829  TSH 0.890    Anemia Panel: No results for input(s): "VITAMINB12", "FOLATE", "FERRITIN",  "TIBC", "IRON", "RETICCTPCT" in the last 72 hours.  Urine analysis:    Component Value Date/Time   COLORURINE YELLOW 02/26/2023 1852   APPEARANCEUR HAZY (A) 02/26/2023 1852   APPEARANCEUR Hazy 05/14/2013 1123   LABSPEC 1.013 02/26/2023 1852   LABSPEC 1.025 05/14/2013 1123   PHURINE 5.0 02/26/2023 1852   GLUCOSEU NEGATIVE 02/26/2023 1852   GLUCOSEU Negative 05/14/2013 1123   HGBUR SMALL (A) 02/26/2023 1852   BILIRUBINUR NEGATIVE 02/26/2023 1852   BILIRUBINUR small 10/03/2021 1026   BILIRUBINUR Negative 05/14/2013 1123   KETONESUR NEGATIVE 02/26/2023 1852   PROTEINUR NEGATIVE 02/26/2023 1852   UROBILINOGEN negative (  A) 10/03/2021 1026   NITRITE POSITIVE (A) 02/26/2023 1852   LEUKOCYTESUR LARGE (A) 02/26/2023 1852   LEUKOCYTESUR Negative 05/14/2013 1123    Sepsis Labs: Lactic Acid, Venous    Component Value Date/Time   LATICACIDVEN 1.3 02/26/2023 1913    MICROBIOLOGY: Recent Results (from the past 240 hour(s))  Culture, blood (routine x 2)     Status: None (Preliminary result)   Collection Time: 02/26/23  6:29 PM   Specimen: BLOOD  Result Value Ref Range Status   Specimen Description BLOOD SITE NOT SPECIFIED  Final   Special Requests   Final    BOTTLES DRAWN AEROBIC AND ANAEROBIC Blood Culture results may not be optimal due to an excessive volume of blood received in culture bottles   Culture   Final    NO GROWTH 3 DAYS Performed at [facility], [address], [place], [place] [address]    Report Status PENDING  Incomplete  Culture, blood (routine x 2)     Status: None (Preliminary result)   Collection Time: 02/26/23  6:48 PM   Specimen: BLOOD  Result Value Ref Range Status   Specimen Description BLOOD SITE NOT SPECIFIED  Final   Special Requests   Final    BOTTLES DRAWN AEROBIC AND ANAEROBIC Blood Culture adequate volume   Culture   Final    NO GROWTH 3 DAYS Performed at [facility], [address], [place], [place] [address]    Report Status  PENDING  Incomplete  Urine Culture     Status: Abnormal   Collection Time: 02/26/23  6:52 PM   Specimen: Urine, Random  Result Value Ref Range Status   Specimen Description URINE, RANDOM  Final   Special Requests NONE Reflexed from [id number]  Final   Culture (A)  Final    >=100,000 COLONIES/mL ESCHERICHIA COLI >=100,000 COLONIES/mL GROUP B STREP(S.AGALACTIAE)ISOLATED TESTING AGAINST S. AGALACTIAE NOT ROUTINELY PERFORMED DUE TO PREDICTABILITY OF AMP/PEN/VAN SUSCEPTIBILITY. Performed at [facility], [address], [place], [place] [address]    Report Status 03/01/2023 FINAL  Final   Organism ID, Bacteria ESCHERICHIA COLI (A)  Final      Susceptibility   Escherichia coli - MIC*    AMPICILLIN 4 SENSITIVE Sensitive     CEFAZOLIN <=4 SENSITIVE Sensitive     CEFEPIME <=0.12 SENSITIVE Sensitive     CEFTRIAXONE <=0.25 SENSITIVE Sensitive     CIPROFLOXACIN <=0.25 SENSITIVE Sensitive     GENTAMICIN <=1 SENSITIVE Sensitive     IMIPENEM <=0.25 SENSITIVE Sensitive     NITROFURANTOIN <=16 SENSITIVE Sensitive     TRIMETH/SULFA <=20 SENSITIVE Sensitive     AMPICILLIN/SULBACTAM <=2 SENSITIVE Sensitive     PIP/TAZO <=4 SENSITIVE Sensitive     * >=100,000 COLONIES/mL ESCHERICHIA COLI  Respiratory (~20 pathogens) panel by PCR     Status: None   Collection Time: 02/27/23  3:46 AM   Specimen: Nasopharyngeal Swab; Respiratory  Result Value Ref Range Status   Adenovirus NOT DETECTED NOT DETECTED Final   Coronavirus 229E NOT DETECTED NOT DETECTED Final    Comment: (NOTE) The Coronavirus on the Respiratory Panel, DOES NOT test for the novel  Coronavirus (2019 nCoV)    Coronavirus HKU1 NOT DETECTED NOT DETECTED Final   Coronavirus NL63 NOT DETECTED NOT DETECTED Final   Coronavirus OC43 NOT DETECTED NOT DETECTED Final   Metapneumovirus NOT DETECTED NOT DETECTED Final   Rhinovirus / Enterovirus NOT DETECTED NOT DETECTED Final   Influenza A NOT DETECTED NOT  DETECTED Final   Influenza B NOT  DETECTED NOT DETECTED Final   Parainfluenza Virus 1 NOT DETECTED NOT DETECTED Final   Parainfluenza Virus 2 NOT DETECTED NOT DETECTED Final   Parainfluenza Virus 3 NOT DETECTED NOT DETECTED Final   Parainfluenza Virus 4 NOT DETECTED NOT DETECTED Final   Respiratory Syncytial Virus NOT DETECTED NOT DETECTED Final   Bordetella pertussis NOT DETECTED NOT DETECTED Final   Bordetella Parapertussis NOT DETECTED NOT DETECTED Final   Chlamydophila pneumoniae NOT DETECTED NOT DETECTED Final   Mycoplasma pneumoniae NOT DETECTED NOT DETECTED Final    Comment: Performed at [facility], [address], [place], [place] [address]    RADIOLOGY STUDIES/RESULTS: No results found.   LOS: 3 days   Jeoffrey Massed, MD  [facility]    To contact the attending provider between 7A-7P or the covering provider during after hours 7P-7A, please log into the web site [email or web address] and access using universal [place] password for that web site. If you do not have the password, please call the hospital operator.  03/01/2023, 12:25 PM

## 2023-03-01 NOTE — Consult Note (Signed)
[facility] Inpatient Consult   03/01/2023  Dustin [name] 01-Jun-1956 [id number]  [facility] [[facility]]  Accountable Care Organization [ACO] Patient: Dustin [name] [Dual Complete]  *Patient shows as having a legal guardian noted  Primary Care Provider: new provider is Marcine Matar, MD, [facility], provider is listed for the transition of care follow up appointments and calls   [facility] Liaison screened the patient remotely at [facility].    The patient was screened for  day readmission hospitalization with noted high risk score for unplanned readmission risk hospital admissions.  The patient was assessed for potential [facility]) Care Management service needs for post hospital transition for care coordination. Review of patient's electronic medical record on behalf of [facility] reveals patient is a LTC resident at [facility].  Plan: Need are currently to be met a LTC level of care  Referral request for community care coordination: none   Community Care Management/Population Health does not replace or interfere with any arrangements made by the Inpatient Transition of Care team.  Will sign off.   For questions contact:   Charlesetta Shanks, RN, BSN, CCM [place]  [facility], [facility] Liaison Direct Dial: [phone] or secure chat Website: [email or web address]
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
## 2023-03-01 NOTE — Care Management Important Message (Signed)
Important Message  Patient Details  Name: Dustin [name] MRN: [id number] Date of Birth: 30-Apr-1956   Important Message Given:        Dorena Bodo 03/01/2023, 2:39 PM

## 2023-03-01 NOTE — Progress Notes (Signed)
Patient in yellow mews at start of shift

## 2023-03-01 NOTE — Consult Note (Signed)
[facility] Psychiatry Consult Note   Service Date: March 01, 2023 LOS:  LOS: 3 days  MRN: [id number] Type of consult:  Followup   Primary Psychiatric Diagnoses  Delirium due to multiple etiologies, acute, mixed Hx schizoaffective disorder, bipolar type  Assessment  Dustin [name] is a 67 y.o. male with a past psychiatric history of schizoaffective vs bipolar disorder, substance use history of tobacco use disorder, and significant PMHx of chronic HTN . Psychiatry was consulted for "presented intoxicated, had suicidal ideations" by Benita Gutter, DO  On initial evaluation, the patient demonstrated a disturbance in attention accompanied by reduced awareness of the environment that developed over a short period of time (hours to days) and represents a change in baseline attention and awareness, and tends to fluctuate in severity during the course of a day. There is also a notable disturbance in cognition, namely disorientation and perception. The aforementioned disturbances have lasted for days and are not better explained by another pre-existing, established, or evolving neurocognitive disorder/s; they also do not occur in the context of a severely reduced level of arousal, such as coma. Lastly, there is evidence from the history, physical examination, or laboratory findings that the disturbance observed is a direct physiological consequence of other medical condition/s, including sepsis secondary to UTI / pneumonia. The delirium presents with level of psychomotor activity that rapidly fluctutates. His current presentation of altered mental status is most consistent with delirium due to multiple etiologies, acute, mixed.  Over the past several days, patient was observed to be fluctuating between periods of agitation, paranoia + psychosis and being pleasantly disoriented, tangential. Observation over past few days supports mixed picture of delirium.  Unclear at this time as to what history of  schizoaffective disorder is playing a role into patient's current presentation as on chart review, patient has not received his LAI paliperidone for the month of September. Given his ongoing medical issues, still favor delirium to explain perceptual disturbances and agitation. Patient will benefit from adjustment of his antipsychotic regimen (see below)  Diagnoses:  Active Hospital problems: Principal Problem:   Sepsis secondary to UTI [facility]) Active Problems:   Tobacco use disorder   Schizoaffective disorder, bipolar type (HCC)   Schizophrenia (HCC)   Sinus bradycardia   Hypothermia   Altered mental status   Aggressive behavior   Urinary tract infection without hematuria   Metabolic encephalopathy    Plan and Recommendations  ## Interventions (medications, psychoeducation, etc):  -- restart home paliperidone palmitate LAI at 156 mg, then 156 mg 1 week later, then 234 mg every 28 days thereafter (dosing in accordance with manufacturer guidelines for missed doses, last given 12/27/2022) -- stop oral paliperidone 3 mg -- stop home quetiapine 25 mg twice daily -- continue home sertraline 50 mg daily -- continue haloperidol 5 mg IV every 6 hours as needed for agitation -- treatment of underlying medical conditions (UTI, pneumonia) per primary team to address root causes of delirium  ## Further Work-up:  -- none  -- most recent EKG on 02/27/2023 had QtC of 466 -- Pertinent labwork reviewed earlier this admission includes: CMP, CBC w/ diff, and CXR  ## Medical Decision Making Capacity:  -- Not formally assessed during this encounter  ## Disposition:  -- No indication for inpatient psychiatric admission. Recommending outpatient psychiatry / psychology follow-up. Defer immediate disposition plan to primary team.  ## Behavioral / Environmental:  -- Standard delirium precautions and Fall precautions  ##Legal Status -- Patient voluntary  Thank you for  this consult request. Our  recommendations are listed above.  We will continue to follow patient's hospital course  ## Safety and Observation Level:  - Based on my clinical evaluation, I estimate the patient to be at low risk of self harm in the current setting - At this time, we recommend a no additional level of observation at present, though threshold to provide patient with 1-to-1 sitter should be low given aggression on presentation. This decision is based on my review of the chart including patient's history and current presentation, interview of the patient, mental status examination, and consideration of suicide risk including evaluating suicidal ideation, plan, intent, suicidal or self-harm behaviors, risk factors, and protective factors. This judgment is based on our ability to directly address suicide risk, implement suicide prevention strategies and develop a safety plan while the patient is in the clinical setting. Please contact our team if there is a concern that risk level has changed.  Augusto Gamble, MD  History Obtained on Initial Interview  Relevant Aspects of Hospital Course:  Admitted on 02/26/2023 for mental status change (combative, swinging at nursing home staff).  Patient Report:  9/24 Patient was found to be very somnolent but did rouse to voice. He was oriented to self and location but not to situation ("I got hit in the stomach") or time ("I don't care about that").  When asked if patient had any thoughts of wanting to hurt himself, patient denies. When asked if he has thoughts of hurting other people, he endorses. I asked patient who he wants to hurt and he responds, "a lot of people." He was unable to give me any specific names.  When asked if patient could perform months of the year backwards, he says, "leave me alone right now - I got a bunch of retired people, my brothers - come find me." He then continued to mumble unintelligibly until he dozed off to sleep.  I terminated interview as patient  became increasingly irritable and refused to answer further questions.  9/25 As I entered the room, Patient today not oriented to time. He says he cannot count months of the year backwards ("I'm bad at this"). When asked how he slept, patient says, "not well. I'm in a strange place."  During the interview patient became tangential. He says: "My friend is attractive even though they handicapped" "Some people don't like me, some people do," said with a smile  When asked if patient hears voices, he says, "there's no such thing." He denies SI, HI, or VH.  As I was exiting the room, patient continued to ramble unintelligibly.  9/26 Upon entering the room, patient yelled, "I don't want to talk to you - get out!" I told patient I was going to open the windows to which he responded, "yeah open the damn windows."  I asked patient if he could give me his name; he says, "I'm not telling you my name. You can't control me." I asked patient if he knew where he was, but instead of responding to my question he began to ramble incoherently. I redirected patient by asking if he knows he is in a hospital. He says, "I was sick before I came here." He points to the wall in front of him, saying, "that bitch over there (referring to the poster of nursing leadership member) - she's doing this to me." Patient says he did not sleep.  I terminated the interview early as patient became increasingly agitated. He kept rambling as I exited  the room.  Collateral information obtained 02/28/2023 [facility], patient's brother - limited guardian)  I spoke to patient's brother who gave consent for [place] to treat patient's schizoaffective disorder with paliperidone - [name] gave [place] consent.  He shares that this is not the first time patient has acted this way (describes delirium symptoms). Ultimately he wants patient to go back to [facility].  [name] asked me how much longer patient is going to be in the hospital and I  told him this is a question the primary team can address better.  During this conversation, I explained in simple terms the patient's mental health condition, answered questions pertaining to the patient's current treatment and provided updates, and outlined the treatment plan moving forward.  Psychiatric and Social History   Psychiatric History  Information collected from patient and available chart review  Prev dx/sx: schizoaffective disorder, bipolar type Current Psych Provider: unknown Current Psych Meds: sertraline, quetiapine Past Psych Meds: haloperidol LAI, paliperidone LAI  Family psych history: not obtained  Social History:  Living situation: permanent nursing home  Substance Use History: Not obtained  Other Histories  These are pulled from EMR and updated if appropriate.   Family History:  The patient's family history includes Hypertension in his brother, brother, and brother; Lung cancer in his father; Prostate cancer in his brother.  Medical History: Past Medical History:  Diagnosis Date   Schizophrenia [facility])     Surgical History: Past Surgical History:  Procedure Laterality Date   COLON SURGERY     COLONOSCOPY N/A 10/06/2021   Procedure: COLONOSCOPY;  Surgeon: Toledo, Boykin Nearing, MD;  Location: [facility] ENDOSCOPY;  Service: Gastroenterology;  Laterality: N/A;   ESOPHAGOGASTRODUODENOSCOPY N/A 10/06/2021   Procedure: ESOPHAGOGASTRODUODENOSCOPY (EGD);  Surgeon: Toledo, Boykin Nearing, MD;  Location: [facility] ENDOSCOPY;  Service: Gastroenterology;  Laterality: N/A;   PARTIAL COLECTOMY  10/13/2021   Procedure: RIGHT COLECTOMY;  Surgeon: Leafy Ro, MD;  Location: [facility] ORS;  Service: General;;    Medications:   Current Facility-Administered Medications:    acetaminophen (TYLENOL) tablet 650 mg, 650 mg, Oral, Q6H PRN **OR** acetaminophen (TYLENOL) suppository 650 mg, 650 mg, Rectal, Q6H PRN, Crosley, Debby, MD   cefTRIAXone (ROCEPHIN) 1 g in sodium chloride 0.9 % 100 mL  IVPB, 1 g, Intravenous, Q24H, Crosley, Debby, MD, Last Rate: 200 mL/hr at 02/28/23 2301, 1 g at 02/28/23 2301   doxycycline (VIBRAMYCIN) 100 mg in sodium chloride 0.9 % 250 mL IVPB, 100 mg, Intravenous, Q12H, Crosley, Debby, MD, Stopped at 03/01/23 0019   enoxaparin (LOVENOX) injection 40 mg, 40 mg, Subcutaneous, Q24H, Crosley, Debby, MD   haloperidol lactate (HALDOL) injection 5 mg, 5 mg, Intravenous, Q6H PRN, Crosley, Debby, MD, 5 mg at 02/27/23 0037   midodrine (PROAMATINE) tablet 2.5 mg, 2.5 mg, Oral, TID WC, Crosley, Debby, MD, 2.5 mg at 02/28/23 1757   nicotine (NICODERM CQ - dosed in mg/24 hours) patch 14 mg, 14 mg, Transdermal, Daily, Crosley, Debby, MD, 14 mg at 02/26/23 2247   ondansetron (ZOFRAN) tablet 4 mg, 4 mg, Oral, Q6H PRN **OR** ondansetron (ZOFRAN) injection 4 mg, 4 mg, Intravenous, Q6H PRN, Crosley, Debby, MD   paliperidone (INVEGA) 24 hr tablet 3 mg, 3 mg, Oral, QHS, Augusto Gamble, MD   QUEtiapine (SEROQUEL) tablet 25 mg, 25 mg, Oral, BID, Augusto Gamble, MD, 25 mg at 02/28/23 2212   senna-docusate (Senokot-S) tablet 1 tablet, 1 tablet, Oral, QHS PRN, Crosley, Debby, MD   sertraline (ZOLOFT) tablet 50 mg, 50 mg, Oral, Daily, Gery Pray, MD,  50 mg at 02/28/23 1119  Allergies: No Known Allergies  Exam Findings  Vital signs:  Temp:  [97.6 F (36.4 C)-97.9 F (36.6 C)] 97.9 F (36.6 C) (09/26 0400) Pulse Rate:  [47-54] 50 (09/26 0400) Resp:  [10-17] 12 (09/26 0400) BP: (91-121)/(58-76) 91/58 (09/26 0400) SpO2:  [96 %-97 %] 97 % (09/26 0400)  Psychiatric Specialty Exam:  Presentation  General Appearance: Casual  Eye Contact:Fair  Speech:Garbled; Normal Rate  Speech Volume:Increased  Handedness:-- (not assessed)   Mood and Affect  Mood:-- ("I don't want to talk to you - get out!")  Affect:Inappropriate (angry, irritable)   Thought Process  Thought Processes:Irrevelant; Disorganized  Descriptions of Associations:Loose  Orientation:None  Thought  Content:Illogical; Paranoid Ideation  History of Schizophrenia/Schizoaffective disorder:Yes  Duration of Psychotic Symptoms:Greater than six months  Hallucinations:Hallucinations: -- (unable to assess)  Ideas of Reference:-- (unable to assess)  Suicidal Thoughts:Suicidal Thoughts: -- (unable to assess)  Homicidal Thoughts:Homicidal Thoughts: -- (unable to assess) HI Passive Intent and/or Plan: -- (unable to assess)   Sensorium  Memory:-- (unable to assess)  Judgment:Poor  Insight:Lacking   Executive Functions  Concentration:Poor  Attention Span:-- (not formally assessed)  Recall:-- (unable to assess)  Fund of Knowledge:Poor  Language:Poor   Psychomotor Activity  Psychomotor Activity:Psychomotor Activity: Normal   Assets  Assets:Resilience   Sleep  Sleep:Sleep: Poor   Physical Exam: Physical Exam Vitals and nursing note reviewed.  HENT:     Head: Normocephalic and atraumatic.  Pulmonary:     Effort: Pulmonary effort is normal.   Blood pressure (!) 91/58, pulse (!) 50, temperature 97.9 F (36.6 C), temperature source Oral, resp. rate 12, height 5\' 7"  (1.702 m), weight 56.2 kg, SpO2 97%. Body mass index is 19.42 kg/m.

## 2023-03-02 DIAGNOSIS — N390 Urinary tract infection, site not specified: Secondary | ICD-10-CM | POA: Diagnosis not present

## 2023-03-02 DIAGNOSIS — A419 Sepsis, unspecified organism: Secondary | ICD-10-CM | POA: Diagnosis not present

## 2023-03-02 DIAGNOSIS — F250 Schizoaffective disorder, bipolar type: Secondary | ICD-10-CM | POA: Diagnosis not present

## 2023-03-02 DIAGNOSIS — F068 Other specified mental disorders due to known physiological condition: Secondary | ICD-10-CM

## 2023-03-02 DIAGNOSIS — F17200 Nicotine dependence, unspecified, uncomplicated: Secondary | ICD-10-CM | POA: Diagnosis not present

## 2023-03-02 MED ORDER — HALOPERIDOL 2 MG PO TABS: 2.0000 mg | ORAL_TABLET | Freq: Three times a day (TID) | ORAL | 0 refills | Status: DC | PRN

## 2023-03-02 MED ORDER — INVEGA SUSTENNA 156 MG/ML IM SUSY: 156.0000 mg | PREFILLED_SYRINGE | Freq: Once | INTRAMUSCULAR | 0 refills | Status: DC

## 2023-03-02 MED ORDER — INVEGA SUSTENNA 234 MG/1.5ML IM SUSY: 234.0000 mg | PREFILLED_SYRINGE | INTRAMUSCULAR | 0 refills | Status: DC

## 2023-03-02 MED ORDER — PALIPERIDONE ER 3 MG PO TB24: 3.0000 mg | ORAL_TABLET | Freq: Every day | ORAL | 0 refills | Status: DC

## 2023-03-02 MED ORDER — HALOPERIDOL 1 MG PO TABS: 2.0000 mg | ORAL_TABLET | Freq: Three times a day (TID) | ORAL | Status: DC | PRN

## 2023-03-02 MED ORDER — AMOXICILLIN 500 MG PO CAPS: 500.0000 mg | ORAL_CAPSULE | Freq: Three times a day (TID) | ORAL | Status: AC

## 2023-03-02 MED ORDER — SODIUM CHLORIDE 0.9 % IV BOLUS
500.0000 mL | Freq: Once | INTRAVENOUS | Status: DC
Start: 2023-03-02 — End: 2023-03-02

## 2023-03-02 NOTE — Discharge Summary (Signed)
PATIENT DETAILS Name: Dustin [name] Age: 67 y.o. Sex: male Date of Birth: 21-Sep-1955 MRN: [id number]. Admitting Physician: Gery Pray, MD WUJ:[id number], Binnie Rail, MD  Admit Date: 02/26/2023 Discharge date: 03/02/2023  Recommendations for Outpatient Follow-up:  Follow up with PCP in 1-2 weeks Please obtain CMP/CBC in one week Please ensure follow up with psychiatry  Admitted From:  SNF  Disposition: Skilled nursing facility   Discharge Condition: good  CODE STATUS:   Code Status: Full Code   Diet recommendation:  Diet Order             Diet general           Diet Heart Room service appropriate? Yes; Fluid consistency: Thin  Diet effective now                    Brief Summary: Patient is a 67 y.o.  male with history of schizoaffective disorder versus bipolar disorder-who was transferred from SNF for combative behavior.   Significant events: 9/23>> admit to [facility]   Significant studies: 9/23>> CXR: Emphysema/bronchitis changes\-but no infiltrates.   Significant microbiology data: 9/23>> respiratory virus panel: Negative 9/23>> blood culture: No growth 9/23>> urine culture: E. coli/group B strep  Brief Hospital Course: Complicated UTI Sepsis ruled out-clinically improved  Initially on Rocephin-has been switched to amoxicillin-for 3-4 more days.   Altered mental status-paranoid behavior  History of schizoaffective disorder Continues to be paranoid/delusional-at times-although somewhat better today This is likely from underlying psych issues and not from UTI He has been on occasion refusing blood work and refusing his psych medications. Psych followed closely-discussed with Dr.Maurer-continue Invega at bedtime-and continue Invega IM injections as outlined in the [facility] by psychiatry.  Okay to use Haldol as needed for agitation/anxiety.  Per psychiatry-no need for inpatient psych admission-okay from their point of view to discharge to SNF today.    Bradycardia Mostly resolved-at times very mild Thought to be due to sedative medications that he received in the ED Recent echo June 2024-stable TSH stable   Hypotension Mostly chronic-mild-asymptomatic On midodrine.   Tobacco abuse Will need to be counseled prior to discharge-when mental status allows.   History of colon cancer Outpatient follow-up with PCP/oncology   BMI: Estimated body mass index is 19.42 kg/m as calculated from the following:   Height as of this encounter: 5\' 7"  (1.702 m).   Weight as of this encounter: 56.2 kg.   RN pressure injury documentation: Pressure Injury 11/10/22 Hip Left Stage 2 -  Partial thickness loss of dermis presenting as a shallow open injury with a red, pink wound bed without slough. (Active)  11/10/22 1535  Location: Hip  Location Orientation: Left  Staging: Stage 2 -  Partial thickness loss of dermis presenting as a shallow open injury with a red, pink wound bed without slough.  Wound Description (Comments):   Present on Admission: Yes     Pressure Injury 11/16/22 Coccyx Medial Stage 2 -  Partial thickness loss of dermis presenting as a shallow open injury with a red, pink wound bed without slough. Abbrassion from what appears to be a poped blister (Active)  11/16/22 2030  Location: Coccyx  Location Orientation: Medial  Staging: Stage 2 -  Partial thickness loss of dermis presenting as a shallow open injury with a red, pink wound bed without slough.  Wound Description (Comments): Abbrassion from what appears to be a poped blister  Present on Admission: No    Discharge Diagnoses:  Principal Problem:  Sepsis secondary to UTI [facility]) Active Problems:   Schizoaffective disorder, bipolar type (HCC)   Tobacco use disorder   Schizophrenia (HCC)   Sinus bradycardia   Hypothermia   Altered mental status   Aggressive behavior   Urinary tract infection without hematuria   Metabolic encephalopathy   Discharge  Instructions:  Activity:  As tolerated    Discharge Instructions     Call MD for:  extreme fatigue   Complete by: As directed    Call MD for:  persistant dizziness or light-headedness   Complete by: As directed    Call MD for:  persistant nausea and vomiting   Complete by: As directed    Diet general   Complete by: As directed    Increase activity slowly   Complete by: As directed       Allergies as of 03/02/2023   No Known Allergies      Medication List     STOP taking these medications    nicotine 14 mg/24hr patch Commonly known as: NICODERM CQ - dosed in mg/24 hours   QUEtiapine 50 MG tablet Commonly known as: SEROQUEL   sertraline 50 MG tablet Commonly known as: ZOLOFT       TAKE these medications    amoxicillin 500 MG capsule Commonly known as: AMOXIL Take 1 capsule (500 mg total) by mouth every 8 (eight) hours for 4 days.   haloperidol 2 MG tablet Commonly known as: HALDOL Take 1 tablet (2 mg total) by mouth every 8 (eight) hours as needed for agitation (anxiety).   Invega Sustenna 156 MG/ML Susy injection Generic drug: paliperidone Inject 1 mL (156 mg total) into the muscle once for 1 dose. Start taking on: March 08, 2023   Hinda Glatter Sustenna 234 MG/1.5ML injection Generic drug: paliperidone Inject 234 mg into the muscle every 28 (twenty-eight) days. Start taking on: April 05, 2023   midodrine 2.5 MG tablet Commonly known as: PROAMATINE Take 1 tablet (2.5 mg total) by mouth 3 (three) times daily with meals.   paliperidone 3 MG 24 hr tablet Commonly known as: INVEGA Take 1 tablet (3 mg total) by mouth at bedtime.        Follow-up Information     Marcine Matar, MD. Schedule an appointment as soon as possible for a visit in 1 week(s).   Specialty: Internal Medicine Contact information: [address] [place] [address] [phone]                No Known Allergies   Other Procedures/Studies: DG  Chest Portable 1 View  Result Date: 02/26/2023 CLINICAL DATA:  Altered mental status EXAM: PORTABLE CHEST 1 VIEW COMPARISON:  11/10/2022 FINDINGS: Marked patient rotation. Emphysema and diffuse bronchitic changes. Slight increased interstitial and ground-glass opacity in the left mid lung and right base, difficult to exclude acute superimposed process. No pneumothorax IMPRESSION: Emphysema and bronchitic changes. Slight increased interstitial and ground-glass opacity in the left mid lung and right base, difficult to exclude acute superimposed process/pneumonia. Electronically Signed   By: Jasmine Pang M.D.   On: 02/26/2023 20:34     TODAY-DAY OF DISCHARGE:  Subjective:   Dustin [name] today more conversant today than the past several days.   Objective:   Blood pressure (!) 97/56, pulse (!) 49, temperature 97.6 F (36.4 C), temperature source Axillary, resp. rate (!) 22, height 5\' 7"  (1.702 m), weight 56.2 kg, SpO2 97%.  Intake/Output Summary (Last 24 hours) at 03/02/2023 1213 Last data filed  at 03/01/2023 2230 Gross per 24 hour  Intake --  Output 400 ml  Net -400 ml   Filed Weights   02/26/23 1747  Weight: 56.2 kg    Exam: Awake Alert [place].AT,PERRAL Supple Neck,No JVD, No cervical lymphadenopathy appriciated.  Symmetrical Chest wall movement, Good air movement bilaterally, CTAB RRR,No Gallops,Rubs or new Murmurs, No Parasternal Heave +ve B.Sounds, Abd Soft, Non tender, No organomegaly appriciated, No rebound -guarding or rigidity. No Cyanosis, Clubbing or edema, No new Rash or bruise   PERTINENT RADIOLOGIC STUDIES: No results found.   PERTINENT LAB RESULTS: CBC: Recent Labs    02/28/23 0353  WBC 5.6  HGB 11.8*  HCT 38.8*  PLT 150   CMET CMP     Component Value Date/Time   NA 140 02/28/2023 0353   NA 139 02/09/2023 1006   NA 139 05/14/2013 1123   K 4.2 02/28/2023 0353   K 4.0 05/14/2013 1123   CL 108 02/28/2023 0353   CL 111 (H) 05/14/2013 1123   CO2 22  02/28/2023 0353   CO2 23 05/14/2013 1123   GLUCOSE 74 02/28/2023 0353   GLUCOSE 87 05/14/2013 1123   BUN 14 02/28/2023 0353   BUN 14 02/09/2023 1006   BUN 10 05/14/2013 1123   CREATININE 1.09 02/28/2023 0353   CREATININE 1.46 (H) 05/14/2013 1123   CALCIUM 9.3 02/28/2023 0353   CALCIUM 9.3 05/14/2013 1123   PROT 6.4 (L) 02/26/2023 1828   PROT 6.6 02/09/2023 1006   PROT 7.4 05/14/2013 1123   ALBUMIN 3.1 (L) 02/26/2023 1828   ALBUMIN 3.7 (L) 02/09/2023 1006   ALBUMIN 3.7 05/14/2013 1123   AST 17 02/26/2023 1828   AST 16 05/14/2013 1123   ALT 13 02/26/2023 1828   ALT 18 05/14/2013 1123   ALKPHOS 91 02/26/2023 1828   ALKPHOS 102 05/14/2013 1123   BILITOT 0.4 02/26/2023 1828   BILITOT 0.2 02/09/2023 1006   BILITOT 0.4 05/14/2013 1123   EGFR 84 02/09/2023 1006   GFRNONAA >60 02/28/2023 0353   GFRNONAA 53 (L) 05/14/2013 1123    GFR Estimated Creatinine Clearance: 53 mL/min (by C-G formula based on SCr of 1.09 mg/dL). No results for input(s): "LIPASE", "AMYLASE" in the last 72 hours. No results for input(s): "CKTOTAL", "CKMB", "CKMBINDEX", "TROPONINI" in the last 72 hours. Invalid input(s): "POCBNP" No results for input(s): "DDIMER" in the last 72 hours. No results for input(s): "HGBA1C" in the last 72 hours. No results for input(s): "CHOL", "HDL", "LDLCALC", "TRIG", "CHOLHDL", "LDLDIRECT" in the last 72 hours. No results for input(s): "TSH", "T4TOTAL", "T3FREE", "THYROIDAB" in the last 72 hours.  Invalid input(s): "FREET3" No results for input(s): "VITAMINB12", "FOLATE", "FERRITIN", "TIBC", "IRON", "RETICCTPCT" in the last 72 hours. Coags: No results for input(s): "INR" in the last 72 hours.  Invalid input(s): "PT" Microbiology: Recent Results (from the past 240 hour(s))  Culture, blood (routine x 2)     Status: None (Preliminary result)   Collection Time: 02/26/23  6:29 PM   Specimen: BLOOD  Result Value Ref Range Status   Specimen Description BLOOD SITE NOT SPECIFIED   Final   Special Requests   Final    BOTTLES DRAWN AEROBIC AND ANAEROBIC Blood Culture results may not be optimal due to an excessive volume of blood received in culture bottles   Culture   Final    NO GROWTH 4 DAYS Performed at [facility], [address] [place], [place] [address]    Report Status PENDING  Incomplete  Culture,  blood (routine x 2)     Status: None (Preliminary result)   Collection Time: 02/26/23  6:48 PM   Specimen: BLOOD  Result Value Ref Range Status   Specimen Description BLOOD SITE NOT SPECIFIED  Final   Special Requests   Final    BOTTLES DRAWN AEROBIC AND ANAEROBIC Blood Culture adequate volume   Culture   Final    NO GROWTH 4 DAYS Performed at [facility], [address], [place], [place] [address]    Report Status PENDING  Incomplete  Urine Culture     Status: Abnormal   Collection Time: 02/26/23  6:52 PM   Specimen: Urine, Random  Result Value Ref Range Status   Specimen Description URINE, RANDOM  Final   Special Requests NONE Reflexed from [id number]  Final   Culture (A)  Final    >=100,000 COLONIES/mL ESCHERICHIA COLI >=100,000 COLONIES/mL GROUP B STREP(S.AGALACTIAE)ISOLATED TESTING AGAINST S. AGALACTIAE NOT ROUTINELY PERFORMED DUE TO PREDICTABILITY OF AMP/PEN/VAN SUSCEPTIBILITY. Performed at [facility], [address], [place], [place] [address]    Report Status 03/01/2023 FINAL  Final   Organism ID, Bacteria ESCHERICHIA COLI (A)  Final      Susceptibility   Escherichia coli - MIC*    AMPICILLIN 4 SENSITIVE Sensitive     CEFAZOLIN <=4 SENSITIVE Sensitive     CEFEPIME <=0.12 SENSITIVE Sensitive     CEFTRIAXONE <=0.25 SENSITIVE Sensitive     CIPROFLOXACIN <=0.25 SENSITIVE Sensitive     GENTAMICIN <=1 SENSITIVE Sensitive     IMIPENEM <=0.25 SENSITIVE Sensitive     NITROFURANTOIN <=16 SENSITIVE Sensitive     TRIMETH/SULFA <=20 SENSITIVE Sensitive     AMPICILLIN/SULBACTAM <=2 SENSITIVE Sensitive     PIP/TAZO <=4 SENSITIVE  Sensitive     * >=100,000 COLONIES/mL ESCHERICHIA COLI  Respiratory (~20 pathogens) panel by PCR     Status: None   Collection Time: 02/27/23  3:46 AM   Specimen: Nasopharyngeal Swab; Respiratory  Result Value Ref Range Status   Adenovirus NOT DETECTED NOT DETECTED Final   Coronavirus 229E NOT DETECTED NOT DETECTED Final    Comment: (NOTE) The Coronavirus on the Respiratory Panel, DOES NOT test for the novel  Coronavirus (2019 nCoV)    Coronavirus HKU1 NOT DETECTED NOT DETECTED Final   Coronavirus NL63 NOT DETECTED NOT DETECTED Final   Coronavirus OC43 NOT DETECTED NOT DETECTED Final   Metapneumovirus NOT DETECTED NOT DETECTED Final   Rhinovirus / Enterovirus NOT DETECTED NOT DETECTED Final   Influenza A NOT DETECTED NOT DETECTED Final   Influenza B NOT DETECTED NOT DETECTED Final   Parainfluenza Virus 1 NOT DETECTED NOT DETECTED Final   Parainfluenza Virus 2 NOT DETECTED NOT DETECTED Final   Parainfluenza Virus 3 NOT DETECTED NOT DETECTED Final   Parainfluenza Virus 4 NOT DETECTED NOT DETECTED Final   Respiratory Syncytial Virus NOT DETECTED NOT DETECTED Final   Bordetella pertussis NOT DETECTED NOT DETECTED Final   Bordetella Parapertussis NOT DETECTED NOT DETECTED Final   Chlamydophila pneumoniae NOT DETECTED NOT DETECTED Final   Mycoplasma pneumoniae NOT DETECTED NOT DETECTED Final    Comment: Performed at [facility], [address], [place], [place] [address]    FURTHER DISCHARGE INSTRUCTIONS:  Get Medicines reviewed and adjusted: Please take all your medications with you for your next visit with your Primary MD  Laboratory/radiological data: Please request your Primary MD to go over all hospital tests and procedure/radiological results at the follow up, please ask your  Primary MD to get all Hospital records sent to his/her office.  In some cases, they will be blood work, cultures and biopsy results pending at the time of your discharge. Please request that your  primary care M.D. goes through all the records of your hospital data and follows up on these results.  Also Note the following: If you experience worsening of your admission symptoms, develop shortness of breath, life threatening emergency, suicidal or homicidal thoughts you must seek medical attention immediately by calling 911 or calling your MD immediately  if symptoms less severe.  You must read complete instructions/literature along with all the possible adverse reactions/side effects for all the Medicines you take and that have been prescribed to you. Take any new Medicines after you have completely understood and accpet all the possible adverse reactions/side effects.   Do not drive when taking Pain medications or sleeping medications (Benzodaizepines)  Do not take more than prescribed Pain, Sleep and Anxiety Medications. It is not advisable to combine anxiety,sleep and pain medications without talking with your primary care practitioner  Special Instructions: If you have smoked or chewed Tobacco  in the last 2 yrs please stop smoking, stop any regular Alcohol  and or any Recreational drug use.  Wear Seat belts while driving.  Please note: You were cared for by a hospitalist during your hospital stay. Once you are discharged, your primary care physician will handle any further medical issues. Please note that NO REFILLS for any discharge medications will be authorized once you are discharged, as it is imperative that you return to your primary care physician (or establish a relationship with a primary care physician if you do not have one) for your post hospital discharge needs so that they can reassess your need for medications and monitor your lab values.  Total Time spent coordinating discharge including counseling, education and face to face time equals greater than 30 minutes.  SignedJeoffrey Massed 03/02/2023 12:13 PM

## 2023-03-02 NOTE — Plan of Care (Signed)

## 2023-03-02 NOTE — TOC Transition Note (Signed)
Transition of Care [facility]) - CM/SW Discharge Note   Patient Details  Name: Dustin [name] MRN: [id number] Date of Birth: [birth year]  Transition of Care [facility]) CM/SW Contact:  Delilah Shan, LCSWA Phone Number: 03/02/2023, 12:29 PM   Clinical Narrative:     Patient will DC to: [facility]  Anticipated DC date: 03/02/2023  Family notified: [name]  Transport by: Sharin Mons  ?  Per MD patient ready for DC to [facility] . RN, patient, patient's family, and facility notified of DC. Discharge Summary sent to facility. RN given number for report tele#[phone] RM#[phone]. DC packet on chart. Ambulance transport requested for patient.  CSW signing off.    Final next level of care: Skilled Nursing Facility Barriers to Discharge: No Barriers Identified   Patient Goals and CMS Choice   Choice offered to / list presented to : Sibling (patients brother)  Discharge Placement                Patient chooses bed at: [facility] Patient to be transferred to facility by: [facility] Name of family member notified: [name] Patient and family notified of of transfer: 03/02/23  Discharge Plan and Services Additional resources added to the After Visit Summary for   In-house Referral: Clinical Social Work   Post Acute Care Choice: Skilled Nursing Facility                               Social Determinants of Health (SDOH) Interventions SDOH Screenings   Housing: High Risk (07/21/2022)  Alcohol Screen: Low Risk  (03/06/2022)  Depression (PHQ2-9): Low Risk  (02/09/2023)  Tobacco Use: High Risk (02/26/2023)     Readmission Risk Interventions     No data to display

## 2023-03-02 NOTE — Progress Notes (Signed)
Patient refused all morning meds stating that they were cyanide and the hospital is performing unconsented procedures on him.

## 2023-03-02 NOTE — Progress Notes (Signed)
Attempt x2 to call report to number provided.  Phone went unanswered

## 2023-03-02 NOTE — Consult Note (Signed)
[facility] Psychiatry Consult Note   Service Date: March 02, 2023 LOS:  LOS: 4 days  MRN: [id number] Type of consult:  Followup   Primary Psychiatric Diagnoses  Delirium due to multiple etiologies, acute, mixed Hx schizoaffective disorder, bipolar type  Assessment  Dustin [name] is a 67 y.o. male with a past psychiatric history of schizoaffective vs bipolar disorder, substance use history of tobacco use disorder, and significant PMHx of chronic HTN . Psychiatry was consulted for "presented intoxicated, had suicidal ideations" by Benita Gutter, DO  On initial evaluation, the patient demonstrated a disturbance in attention accompanied by reduced awareness of the environment that developed over a short period of time (hours to days) and represents a change in baseline attention and awareness, and tends to fluctuate in severity during the course of a day. There is also a notable disturbance in cognition, namely disorientation and perception. The aforementioned disturbances have lasted for days and are not better explained by another pre-existing, established, or evolving neurocognitive disorder/s; they also do not occur in the context of a severely reduced level of arousal, such as coma. Lastly, there is evidence from the history, physical examination, or laboratory findings that the disturbance observed is a direct physiological consequence of other medical condition/s, including sepsis secondary to UTI / pneumonia. The delirium presents with level of psychomotor activity that rapidly fluctuates. His current presentation of altered mental status is most consistent with delirium due to multiple etiologies, acute, mixed.  Patient had been observed to be fluctuating between periods of agitation, paranoia + psychosis and being pleasantly disoriented, tangential. Observation over past few days supports mixed picture of delirium.  He has been pleasant over past 2 days and is agreeable to taking  long acting injectable antipsychotic.  He is hopeful to be able to discharge to his residential facility.   Diagnoses:  Active Hospital problems: Principal Problem:   Sepsis secondary to UTI [facility]) Active Problems:   Tobacco use disorder   Schizoaffective disorder, bipolar type (HCC)   Schizophrenia (HCC)   Sinus bradycardia   Hypothermia   Altered mental status   Aggressive behavior   Urinary tract infection without hematuria   Metabolic encephalopathy    Plan and Recommendations  ## Interventions (medications, psychoeducation, etc):  -- restart home paliperidone palmitate LAI at 156 mg, then 156 mg 1 week later, then 234 mg every 28 days thereafter (dosing in accordance with manufacturer guidelines for missed doses, last given 12/27/2022) -- Continue oral paliperidone 3 mg -- stop home quetiapine 25 mg twice daily -- Discontinue home sertraline 50 mg daily -- Discontinue haloperidol 5 mg IV every 6 hours as needed for agitation, and provide RX for a PO PRN dose at discharge for his facility.  -- treatment of underlying medical conditions (UTI, pneumonia) per primary team to address root causes of delirium  ## Further Work-up:  -- none  -- most recent EKG on 03/01/2023 had QtC of 378 -- Pertinent labwork reviewed earlier this admission includes: CMP, CBC w/ diff, and CXR  ## Medical Decision Making Capacity:  -- Not formally assessed during this encounter  ## Disposition:  -- No indication for inpatient psychiatric admission. Recommending outpatient psychiatry / psychology follow-up. Defer immediate disposition plan to primary team.  While future psychiatric events cannot be accurately predicted, the patient does not currently require acute inpatient psychiatric care and does not currently meet [facility] involuntary commitment criteria.   ## Behavioral / Environmental:  -- Standard delirium precautions and Fall  precautions  ##Legal Status -- Patient  voluntary  Thank you for this consult request. Our recommendations are listed above.  We will sign-off at this time   ## Safety and Observation Level:  - Based on my clinical evaluation, I estimate the patient to be at low risk of self harm in the current setting - At this time, we recommend a no additional level of observation at present. This decision is based on my review of the chart including patient's history and current presentation, interview of the patient, mental status examination, and consideration of suicide risk including evaluating suicidal ideation, plan, intent, suicidal or self-harm behaviors, risk factors, and protective factors. This judgment is based on our ability to directly address suicide risk, implement suicide prevention strategies and develop a safety plan while the patient is in the clinical setting. Please contact our team if there is a concern that risk level has changed.  Mariel Craft, MD  History Obtained on Initial Interview  Relevant Aspects of Hospital Course:  Admitted on 02/26/2023 for mental status change (combative, swinging at nursing home staff).  Patient Report:  9/24 Patient was found to be very somnolent but did rouse to voice. He was oriented to self and location but not to situation ("I got hit in the stomach") or time ("I don't care about that").  When asked if patient had any thoughts of wanting to hurt himself, patient denies. When asked if he has thoughts of hurting other people, he endorses. I asked patient who he wants to hurt and he responds, "a lot of people." He was unable to give me any specific names.  When asked if patient could perform months of the year backwards, he says, "leave me alone right now - I got a bunch of retired people, my brothers - come find me." He then continued to mumble unintelligibly until he dozed off to sleep.  I terminated interview as patient became increasingly irritable and refused to answer further  questions.  9/25 As I entered the room, Patient today not oriented to time. He says he cannot count months of the year backwards ("I'm bad at this"). When asked how he slept, patient says, "not well. I'm in a strange place."  During the interview patient became tangential. He says: "My friend is attractive even though they handicapped" "Some people don't like me, some people do," said with a smile  When asked if patient hears voices, he says, "there's no such thing." He denies SI, HI, or VH.  As I was exiting the room, patient continued to ramble unintelligibly.  9/26 Upon entering the room, patient yelled, "I don't want to talk to you - get out!" I told patient I was going to open the windows to which he responded, "yeah open the damn windows." I asked patient if he could give me his name; he says, "I'm not telling you my name. You can't control me." I asked patient if he knew where he was, but instead of responding to my question he began to ramble incoherently. I redirected patient by asking if he knows he is in a hospital. He says, "I was sick before I came here." He points to the wall in front of him, saying, "that bitch over there (referring to the poster of nursing leadership member) - she's doing this to me." Patient says he did not sleep. I terminated the interview early as patient became increasingly agitated. He kept rambling as I exited the room.  9/27 On evaluation  today, patient is awake and alert.  He is able to read the date, and appears oriented to situation.  Patient is agreeable to taking his long-acting injectable Gean Birchwood today.  He is hopeful for discharge to his regular living facility.  Reviewed medications, and attempted to decrease polypharmacy, as this can make patient agitated.  After reviewing medications, patient was agreeable to medications for discharge.  He is at his psychiatric baseline with delusions of having chips implanted in his body.  He specifically  denies any suicidal or homicidal ideation.  He denies AVH, but does have delusions of thought insertion and thought broadcasting.  Collateral information obtained 02/28/2023 [facility], patient's brother - limited guardian) I spoke to patient's brother who gave consent for [place] to treat patient's schizoaffective disorder with paliperidone - [name] gave [place] consent. He shares that this is not the first time patient has acted this way (describes delirium symptoms). Ultimately he wants patient to go back to [facility]. [name] asked me how much longer patient is going to be in the hospital and I told him this is a question the primary team can address better.  During this conversation, I explained in simple terms the patient's mental health condition, answered questions pertaining to the patient's current treatment and provided updates, and outlined the treatment plan moving forward.  Psychiatric and Social History   Psychiatric History  Information collected from patient and available chart review  Prev dx/sx: schizoaffective disorder, bipolar type Current Psych Provider: unknown Current Psych Meds: sertraline, quetiapine Past Psych Meds: haloperidol LAI, paliperidone LAI  Family psych history: not obtained  Social History:  Living situation: permanent nursing home  Substance Use History: Not obtained  Other Histories  These are pulled from EMR and updated if appropriate.   Family History:  The patient's family history includes Hypertension in his brother, brother, and brother; Lung cancer in his father; Prostate cancer in his brother.  Medical History: Past Medical History:  Diagnosis Date   Schizophrenia [facility])     Surgical History: Past Surgical History:  Procedure Laterality Date   COLON SURGERY     COLONOSCOPY N/A 10/06/2021   Procedure: COLONOSCOPY;  Surgeon: Toledo, Boykin Nearing, MD;  Location: [facility] ENDOSCOPY;  Service: Gastroenterology;  Laterality: N/A;    ESOPHAGOGASTRODUODENOSCOPY N/A 10/06/2021   Procedure: ESOPHAGOGASTRODUODENOSCOPY (EGD);  Surgeon: Toledo, Boykin Nearing, MD;  Location: [facility] ENDOSCOPY;  Service: Gastroenterology;  Laterality: N/A;   PARTIAL COLECTOMY  10/13/2021   Procedure: RIGHT COLECTOMY;  Surgeon: Leafy Ro, MD;  Location: [facility] ORS;  Service: General;;    Medications:   Current Facility-Administered Medications:    acetaminophen (TYLENOL) tablet 650 mg, 650 mg, Oral, Q6H PRN **OR** acetaminophen (TYLENOL) suppository 650 mg, 650 mg, Rectal, Q6H PRN, Crosley, Debby, MD   amoxicillin (AMOXIL) capsule 500 mg, 500 mg, Oral, Q8H, Ghimire, Shanker M, MD, 500 mg at 03/02/23 0617   enoxaparin (LOVENOX) injection 40 mg, 40 mg, Subcutaneous, Q24H, Crosley, Debby, MD   haloperidol (HALDOL) tablet 2 mg, 2 mg, Oral, Q8H PRN, Mariel Craft, MD   midodrine (PROAMATINE) tablet 2.5 mg, 2.5 mg, Oral, TID WC, Crosley, Debby, MD, 2.5 mg at 03/01/23 2246   nicotine (NICODERM CQ - dosed in mg/24 hours) patch 14 mg, 14 mg, Transdermal, Daily, Crosley, Debby, MD, 14 mg at 02/26/23 2247   ondansetron (ZOFRAN) tablet 4 mg, 4 mg, Oral, Q6H PRN **OR** ondansetron (ZOFRAN) injection 4 mg, 4 mg, Intravenous, Q6H PRN, Joneen Roach, Debby, MD   [COMPLETED] paliperidone (INVEGA SUSTENNA)  injection 156 mg, 156 mg, Intramuscular, Once, 156 mg at 03/02/23 1248 **FOLLOWED BY** [START ON 03/08/2023] paliperidone (INVEGA SUSTENNA) injection 156 mg, 156 mg, Intramuscular, Once **FOLLOWED BY** [START ON 04/05/2023] paliperidone (INVEGA SUSTENNA) injection 234 mg, 234 mg, Intramuscular, Q28 days, Augusto Gamble, MD   paliperidone (INVEGA) 24 hr tablet 3 mg, 3 mg, Oral, QHS, Mariel Craft, MD, 3 mg at 03/02/23 0600   senna-docusate (Senokot-S) tablet 1 tablet, 1 tablet, Oral, QHS PRN, Joneen Roach, Debby, MD  Current Outpatient Medications:    midodrine (PROAMATINE) 2.5 MG tablet, Take 1 tablet (2.5 mg total) by mouth 3 (three) times daily with meals., Disp: , Rfl:     [START ON 03/08/2023] paliperidone (INVEGA SUSTENNA) 156 MG/ML SUSY injection, Inject 1 mL (156 mg total) into the muscle once for 1 dose., Disp: 1 mL, Rfl: 0   [START ON 04/05/2023] paliperidone (INVEGA SUSTENNA) 234 MG/1.5ML injection, Inject 234 mg into the muscle every 28 (twenty-eight) days., Disp: 3 mL, Rfl: 0   amoxicillin (AMOXIL) 500 MG capsule, Take 1 capsule (500 mg total) by mouth every 8 (eight) hours for 4 days., Disp: , Rfl:    haloperidol (HALDOL) 2 MG tablet, Take 1 tablet (2 mg total) by mouth every 8 (eight) hours as needed for agitation (anxiety)., Disp: 30 tablet, Rfl: 0   paliperidone (INVEGA) 3 MG 24 hr tablet, Take 1 tablet (3 mg total) by mouth at bedtime., Disp: 30 tablet, Rfl: 0  Allergies: No Known Allergies  Exam Findings  Vital signs:  Temp:  [97.6 F (36.4 C)-97.7 F (36.5 C)] 97.6 F (36.4 C) (09/27 0400) Pulse Rate:  [49-55] 49 (09/27 0400) Resp:  [10-22] 22 (09/27 0610) BP: (84-97)/(54-67) 97/56 (09/27 0610)  Psychiatric Specialty Exam:  Presentation  General Appearance: Appropriate for Environment  Eye Contact:Good  Speech:Garbled  Speech Volume:Normal  Handedness:-- (not assessed)   Mood and Affect  Mood:Euthymic  Affect:Congruent   Thought Process  Thought Processes:Disorganized  Descriptions of Associations:Loose  Orientation:Full (Time, Place and Person)  Thought Content:Tangential  History of Schizophrenia/Schizoaffective disorder:Yes  Duration of Psychotic Symptoms:Greater than six months  Hallucinations:Hallucinations: None  Ideas of Reference:None  Suicidal Thoughts:Suicidal Thoughts: No  Homicidal Thoughts:Homicidal Thoughts: No HI Passive Intent and/or Plan: -- (unable to assess)   Sensorium  Memory:Immediate Fair; Recent Fair; Remote Fair  Judgment:Impaired  Insight:Present   Executive Functions  Concentration:Good  Attention Span:Good  Recall:Fair  Fund of  Knowledge:Poor  Language:Fair   Psychomotor Activity  Psychomotor Activity:Psychomotor Activity: Normal   Assets  Assets:Communication Skills; Desire for Improvement; Housing; Resilience; Social Support   Sleep  Sleep:Sleep: Good   Physical Exam: Physical Exam Vitals and nursing note reviewed.  Constitutional:      Appearance: He is normal weight.  HENT:     Head: Normocephalic and atraumatic.  Cardiovascular:     Rate and Rhythm: Normal rate.  Pulmonary:     Effort: Pulmonary effort is normal.  Neurological:     General: No focal deficit present.     Mental Status: He is alert.   Blood pressure (!) 97/56, pulse (!) 49, temperature 97.6 F (36.4 C), temperature source Axillary, resp. rate (!) 22, height 5\' 7"  (1.702 m), weight 56.2 kg, SpO2 97%. Body mass index is 19.42 kg/m.

## 2023-03-03 LAB — CULTURE, BLOOD (ROUTINE X 2)
Culture: NO GROWTH
Culture: NO GROWTH

## 2023-03-04 DIAGNOSIS — Z720 Tobacco use: Secondary | ICD-10-CM | POA: Diagnosis not present

## 2023-03-04 DIAGNOSIS — M6281 Muscle weakness (generalized): Secondary | ICD-10-CM | POA: Diagnosis not present

## 2023-03-04 DIAGNOSIS — C189 Malignant neoplasm of colon, unspecified: Secondary | ICD-10-CM | POA: Diagnosis not present

## 2023-03-04 DIAGNOSIS — I959 Hypotension, unspecified: Secondary | ICD-10-CM | POA: Diagnosis not present

## 2023-03-05 DIAGNOSIS — R1311 Dysphagia, oral phase: Secondary | ICD-10-CM | POA: Diagnosis not present

## 2023-03-05 DIAGNOSIS — E119 Type 2 diabetes mellitus without complications: Secondary | ICD-10-CM | POA: Diagnosis not present

## 2023-03-05 DIAGNOSIS — R262 Difficulty in walking, not elsewhere classified: Secondary | ICD-10-CM | POA: Diagnosis not present

## 2023-03-05 DIAGNOSIS — N390 Urinary tract infection, site not specified: Secondary | ICD-10-CM | POA: Diagnosis not present

## 2023-03-05 DIAGNOSIS — Z9181 History of falling: Secondary | ICD-10-CM | POA: Diagnosis not present

## 2023-03-05 DIAGNOSIS — M6281 Muscle weakness (generalized): Secondary | ICD-10-CM | POA: Diagnosis not present

## 2023-03-05 DIAGNOSIS — I959 Hypotension, unspecified: Secondary | ICD-10-CM | POA: Diagnosis not present

## 2023-03-05 DIAGNOSIS — D649 Anemia, unspecified: Secondary | ICD-10-CM | POA: Diagnosis not present

## 2023-03-05 DIAGNOSIS — D696 Thrombocytopenia, unspecified: Secondary | ICD-10-CM | POA: Diagnosis not present

## 2023-03-05 DIAGNOSIS — C189 Malignant neoplasm of colon, unspecified: Secondary | ICD-10-CM | POA: Diagnosis not present

## 2023-03-14 ENCOUNTER — Encounter: Payer: Self-pay | Admitting: Oncology

## 2023-03-20 ENCOUNTER — Telehealth: Payer: Self-pay | Admitting: Internal Medicine

## 2023-03-20 NOTE — Telephone Encounter (Signed)
Hi Dr. Leonides Schanz,    Supervising Provider 03/20/2023 PM    We received a referral for patient to have a colonoscopy due to stage one colon cancer. Patient last had a colonoscopy in 2023 with [facility]. Patient's brother is requesting to transfer care due to patient being in a nursing home located in [place]. Patient previous colonoscopy report is in [facility] for you to review and advise on scheduling.    Thank you.

## 2023-03-21 NOTE — Telephone Encounter (Signed)
Okay to schedule for OV.  Okay to schedule for OV  History of Stage I right colon cancer s/p right hemicolectomy 10/2021  EGD 10/06/21: - Normal esophagus. - Nodular mucosa in the gastric body. Biopsied. - Normal examined duodenum. Path: A. STOMACH, BODY; COLD BIOPSY:  - CHRONIC ACTIVE GASTRITIS WITH HELICOBACTER PYLORI TYPE ORGANISMS.  - INCIDENTAL GASTIC XANTHOMA.  - NEGATIVE FOR DYSPLASIA AND MALIGNANCY.   Colonoscopy 10/06/21: Poor prep. Likely malignant partially obstructing tumor in the proximal ascending colon that was biopsied and tattooed. Stool in the entire colon. Grade IV internal hemorrhoids. Path: B. COLON MASS, ASCENDING; COLD BIOPSY:  - FRAGMENTS OF TUBULOVILLOUS ADENOMA.  - NEGATIVE FOR HIGH-GRADE DYSPLASIA AND MALIGNANCY.

## 2023-03-23 NOTE — Telephone Encounter (Signed)
Called to schedule appointment. Patient's brother picked up and then hung up. Will try again later.

## 2023-04-01 DIAGNOSIS — R262 Difficulty in walking, not elsewhere classified: Secondary | ICD-10-CM | POA: Diagnosis not present

## 2023-04-01 DIAGNOSIS — Z9181 History of falling: Secondary | ICD-10-CM | POA: Diagnosis not present

## 2023-04-01 DIAGNOSIS — D696 Thrombocytopenia, unspecified: Secondary | ICD-10-CM | POA: Diagnosis not present

## 2023-04-01 DIAGNOSIS — I959 Hypotension, unspecified: Secondary | ICD-10-CM | POA: Diagnosis not present

## 2023-04-01 DIAGNOSIS — N390 Urinary tract infection, site not specified: Secondary | ICD-10-CM | POA: Diagnosis not present

## 2023-04-01 DIAGNOSIS — E119 Type 2 diabetes mellitus without complications: Secondary | ICD-10-CM | POA: Diagnosis not present

## 2023-04-01 DIAGNOSIS — M6281 Muscle weakness (generalized): Secondary | ICD-10-CM | POA: Diagnosis not present

## 2023-04-01 DIAGNOSIS — D649 Anemia, unspecified: Secondary | ICD-10-CM | POA: Diagnosis not present

## 2023-04-01 DIAGNOSIS — C189 Malignant neoplasm of colon, unspecified: Secondary | ICD-10-CM | POA: Diagnosis not present

## 2023-04-01 DIAGNOSIS — R1311 Dysphagia, oral phase: Secondary | ICD-10-CM | POA: Diagnosis not present

## 2023-04-03 DIAGNOSIS — D696 Thrombocytopenia, unspecified: Secondary | ICD-10-CM | POA: Diagnosis not present

## 2023-04-03 DIAGNOSIS — I959 Hypotension, unspecified: Secondary | ICD-10-CM | POA: Diagnosis not present

## 2023-04-03 DIAGNOSIS — M6281 Muscle weakness (generalized): Secondary | ICD-10-CM | POA: Diagnosis not present

## 2023-04-03 DIAGNOSIS — Z9181 History of falling: Secondary | ICD-10-CM | POA: Diagnosis not present

## 2023-04-03 DIAGNOSIS — R262 Difficulty in walking, not elsewhere classified: Secondary | ICD-10-CM | POA: Diagnosis not present

## 2023-04-03 DIAGNOSIS — C189 Malignant neoplasm of colon, unspecified: Secondary | ICD-10-CM | POA: Diagnosis not present

## 2023-04-03 DIAGNOSIS — E119 Type 2 diabetes mellitus without complications: Secondary | ICD-10-CM | POA: Diagnosis not present

## 2023-04-03 DIAGNOSIS — N390 Urinary tract infection, site not specified: Secondary | ICD-10-CM | POA: Diagnosis not present

## 2023-04-03 DIAGNOSIS — R1311 Dysphagia, oral phase: Secondary | ICD-10-CM | POA: Diagnosis not present

## 2023-04-03 DIAGNOSIS — D649 Anemia, unspecified: Secondary | ICD-10-CM | POA: Diagnosis not present

## 2023-05-12 ENCOUNTER — Encounter: Payer: Self-pay | Admitting: Oncology

## 2023-06-04 ENCOUNTER — Encounter: Payer: Self-pay | Admitting: Oncology

## 2023-06-11 ENCOUNTER — Ambulatory Visit: Payer: Medicare Other | Admitting: Internal Medicine

## 2023-06-29 ENCOUNTER — Encounter: Payer: Self-pay | Admitting: Oncology

## 2023-07-06 ENCOUNTER — Ambulatory Visit: Payer: Medicare Other | Admitting: Internal Medicine

## 2023-08-14 IMAGING — CR DG CHEST 2V
1 series · 2 of 2 positions shown · non-contrast
Comparison: None.

CLINICAL DATA: Unintentional weight loss, tobacco abuse

EXAM:
CHEST - 2 VIEW

[Series 1: dg chest 2 view · 0.14mm/px · 2 of 2 slices shown]
[im 1/2]
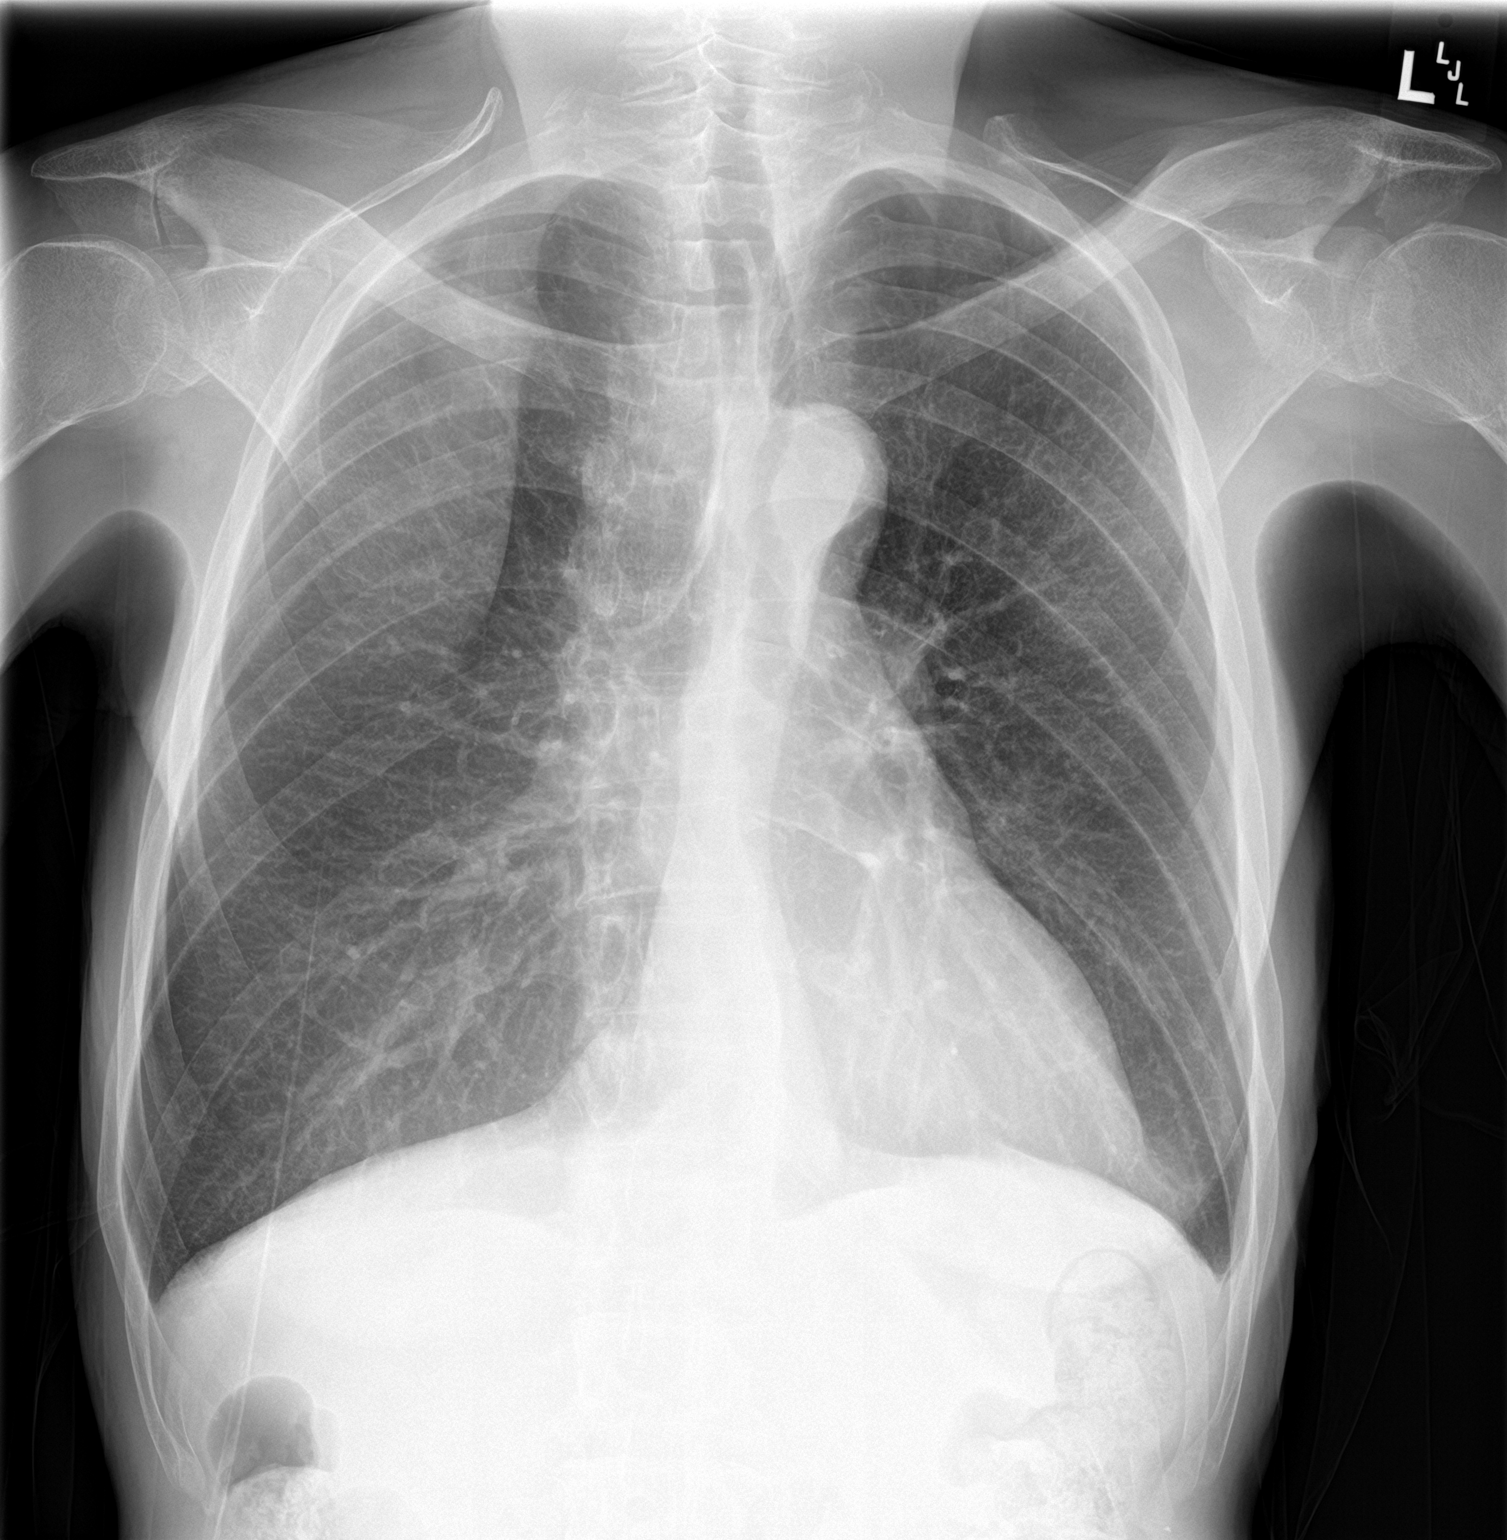
[im 2/2]
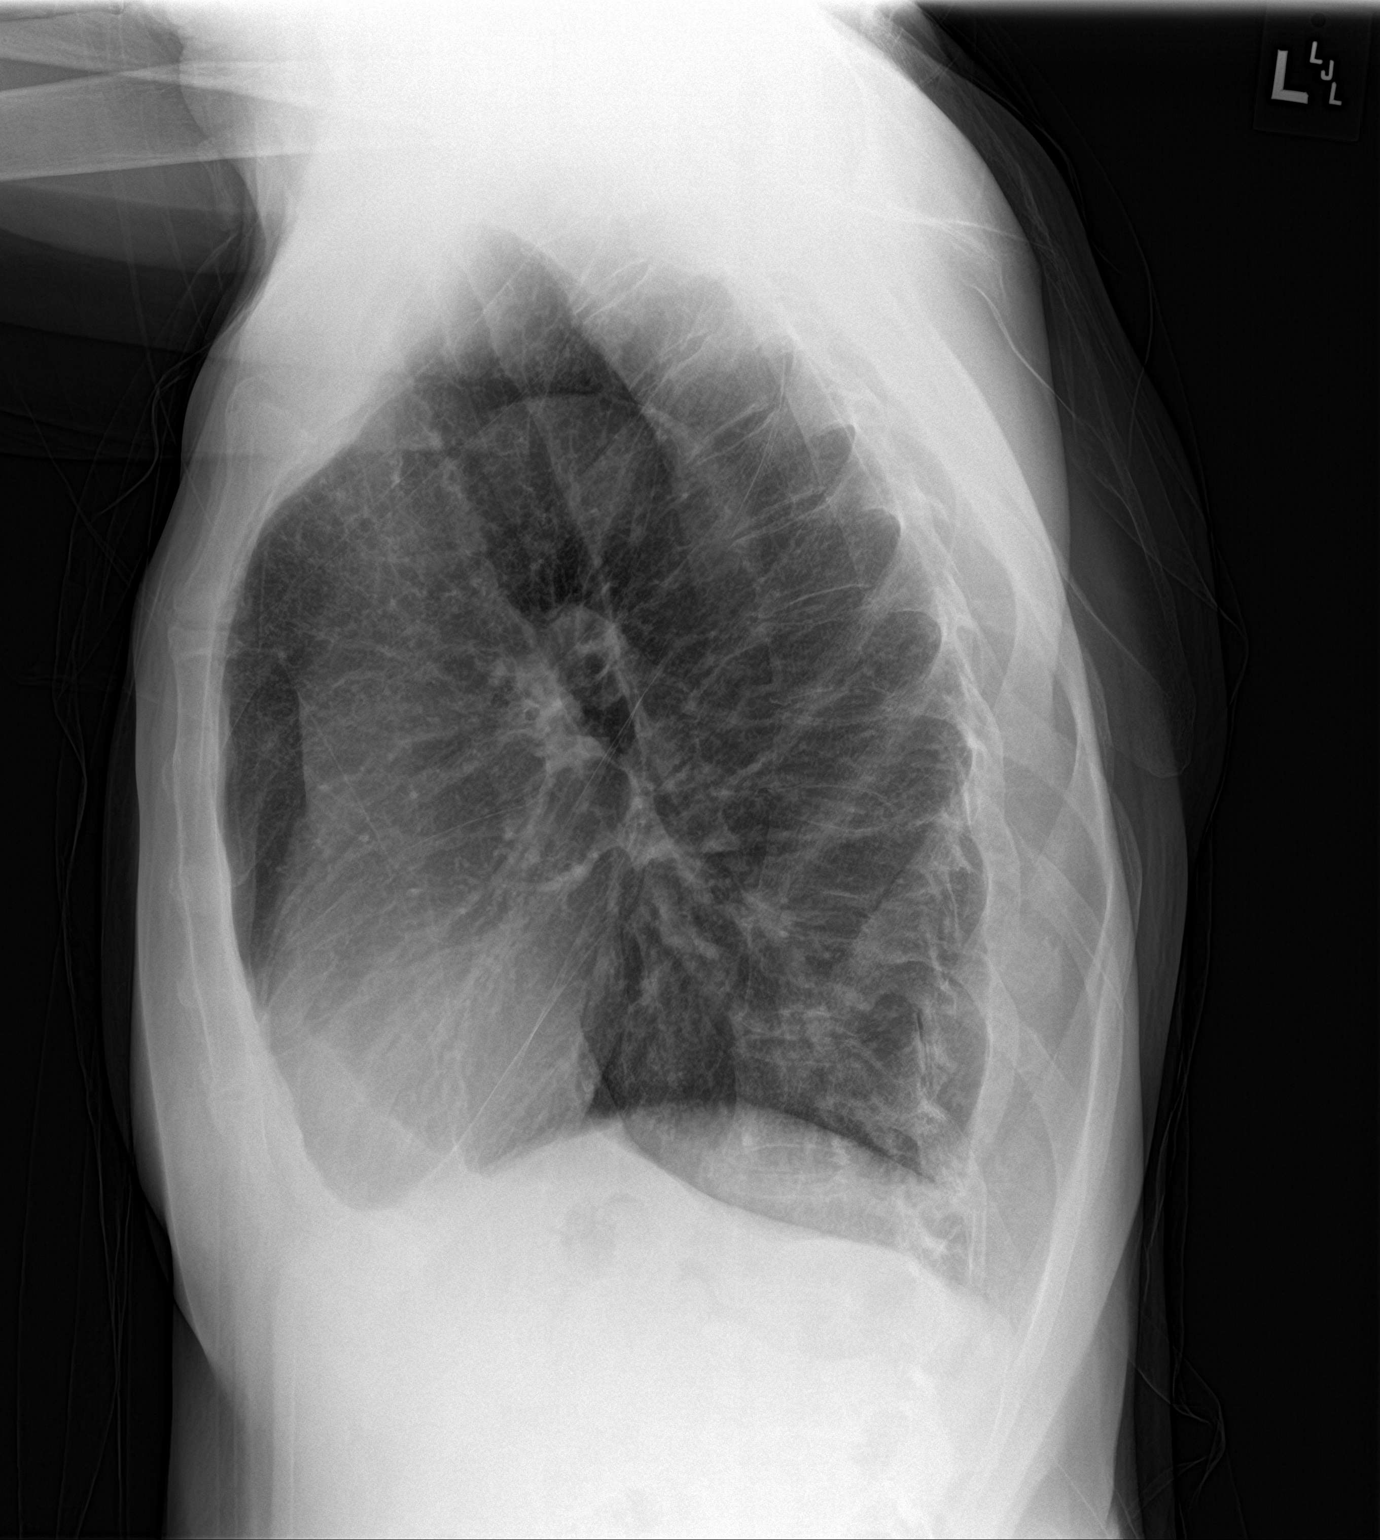

[2 of 2 positions shown; findings below may reference images not displayed]

FINDINGS: Frontal and lateral views of the chest demonstrate an unremarkable
cardiac silhouette. The lungs are hyperinflated with background
interstitial prominence consistent with emphysema. No airspace
disease, effusion, or pneumothorax. There are no acute bony
abnormalities.
IMPRESSION: 1. Emphysema.  No acute intrathoracic process.

## 2023-08-16 IMAGING — CT CT HEAD W/O CM
4 series · 16 of 47 positions shown, 18 images · non-contrast
Comparison: None Available.

CLINICAL DATA: Altered mental status



[Series 2: head wo · axial · 0.43mm/px · z∈[-170,-50]mm · 7 of 34 slices shown, 9 images]
[im 5/34  brain]
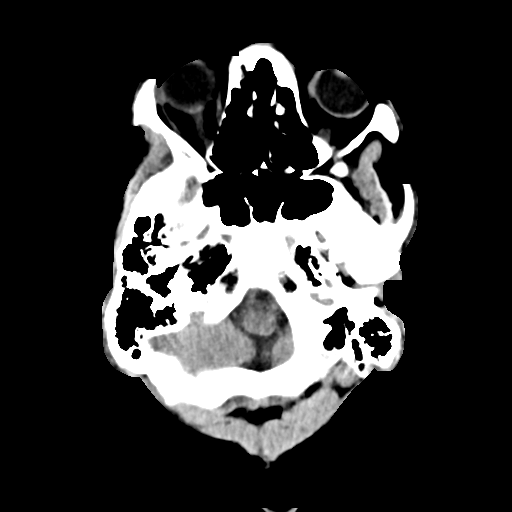
[im 5/34  bone]
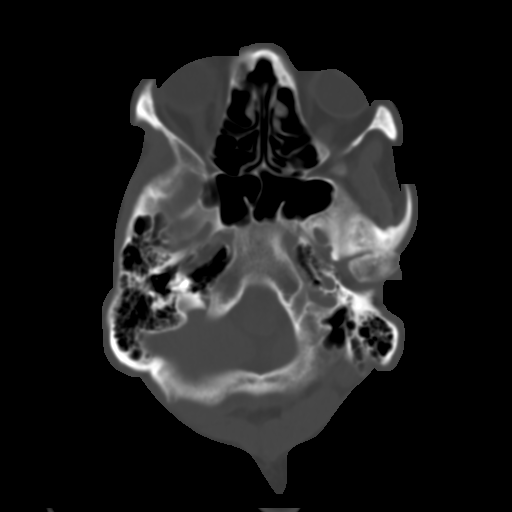
[im 9/34  brain]
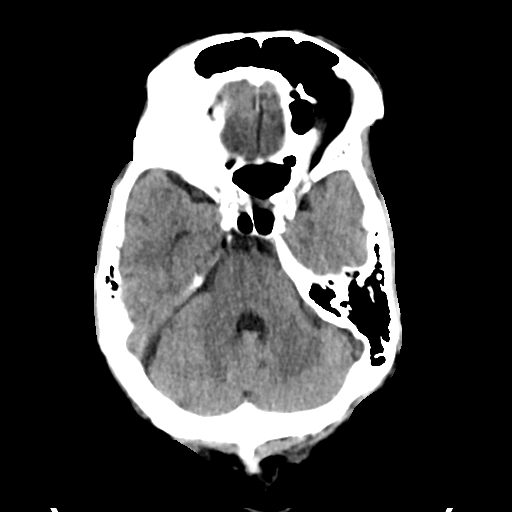
[im 13/34  brain]
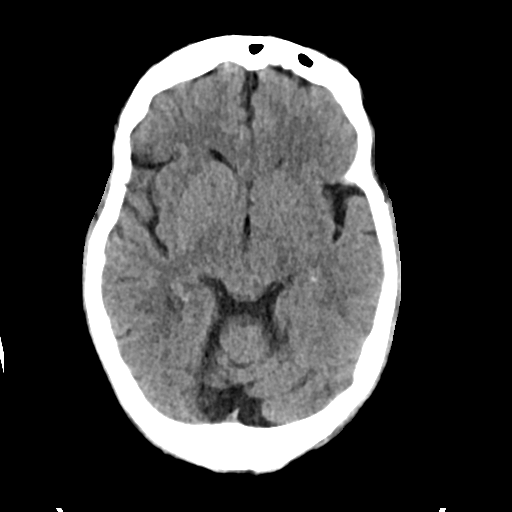
[im 17/34  brain]
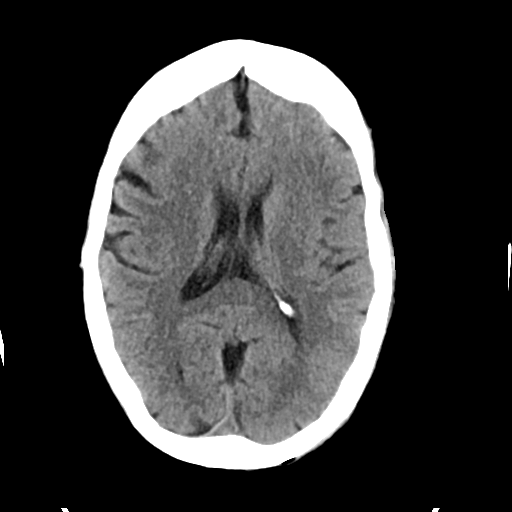
[im 21/34  brain]
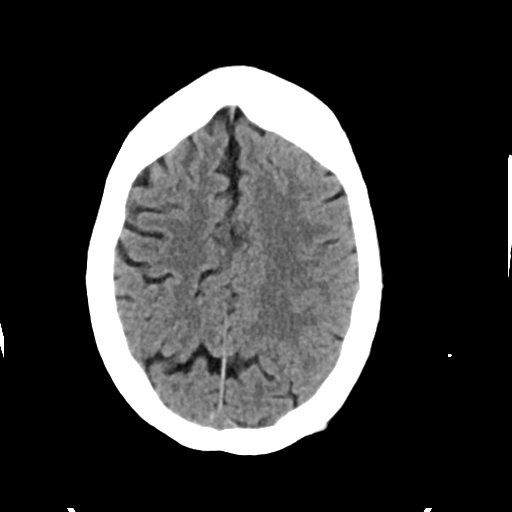
[im 21/34  bone]
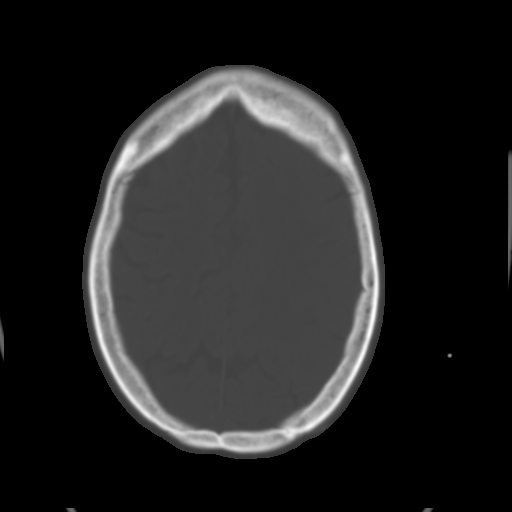
[im 25/34  brain]
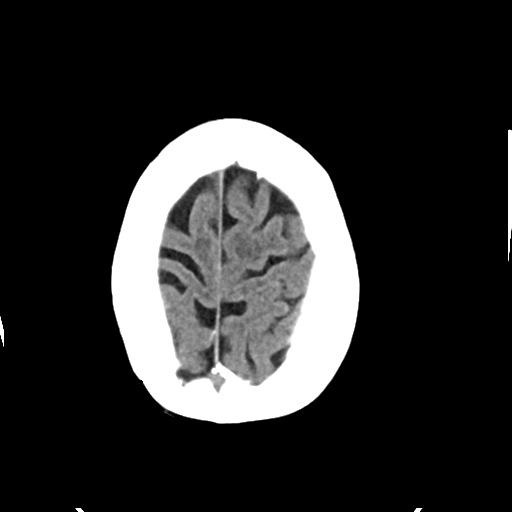
[im 29/34  brain]
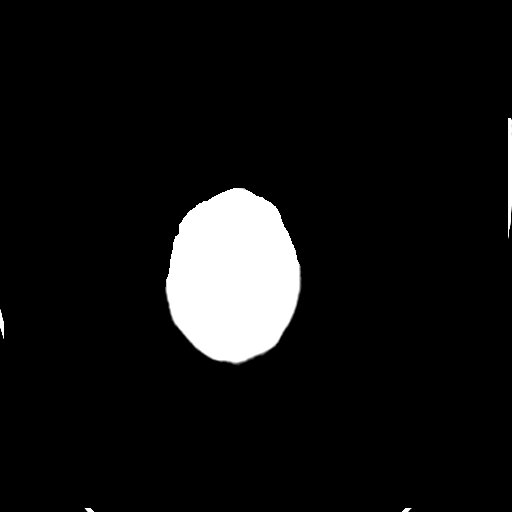

[Series 3: head bone · axial · 0.43mm/px · z∈[-174,-142]mm · 3 of 84 slices shown]
[im 9/84  bone]
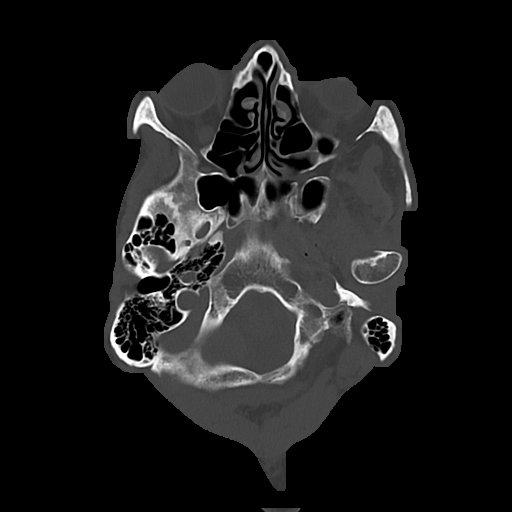
[im 17/84  bone]
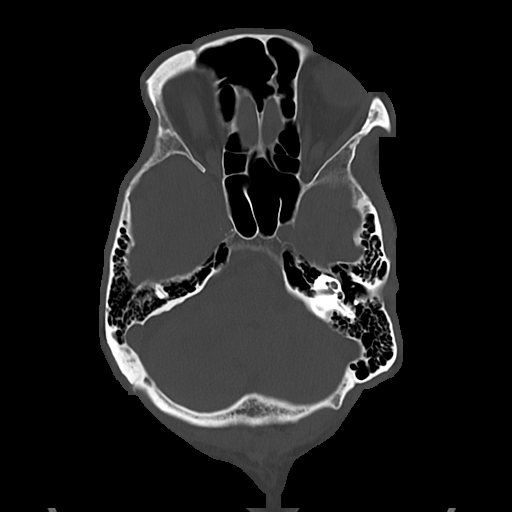
[im 25/84  bone]
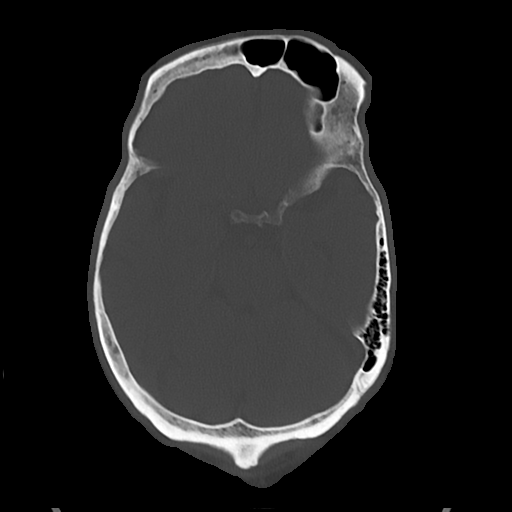

[Series 4: cor soft · coronal · 0.31mm/px · 3 of 73 slices shown]
[im 25/73  brain]
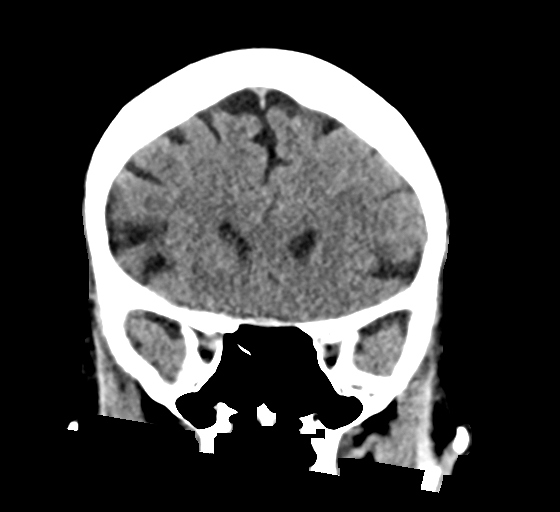
[im 33/73  brain]
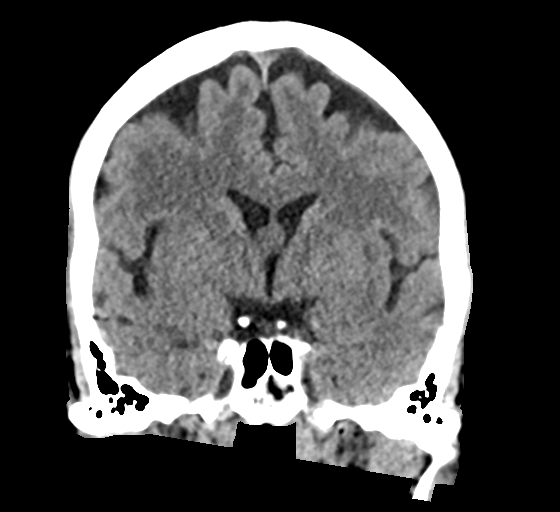
[im 41/73  brain]
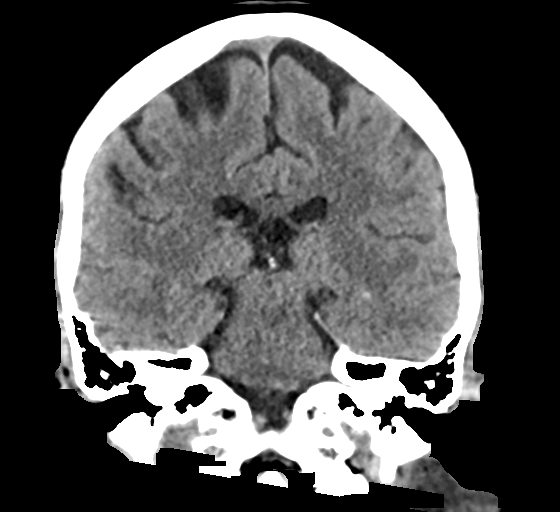

[Series 5: sag soft · sagittal · 0.31mm/px · 3 of 58 slices shown]
[im 23/58  brain]
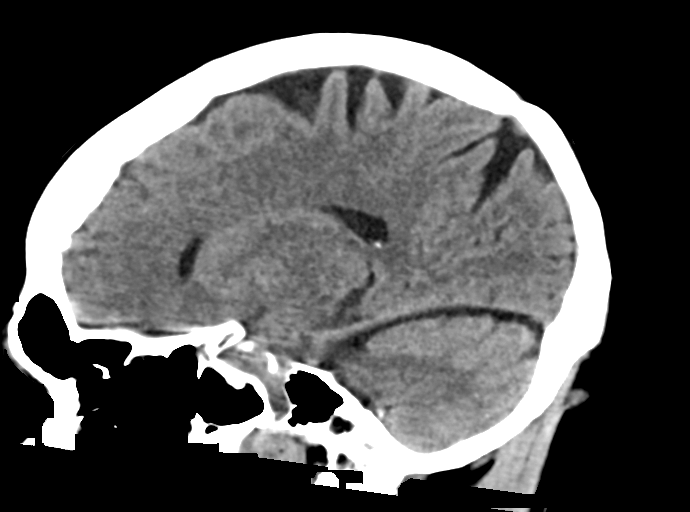
[im 29/58  brain]
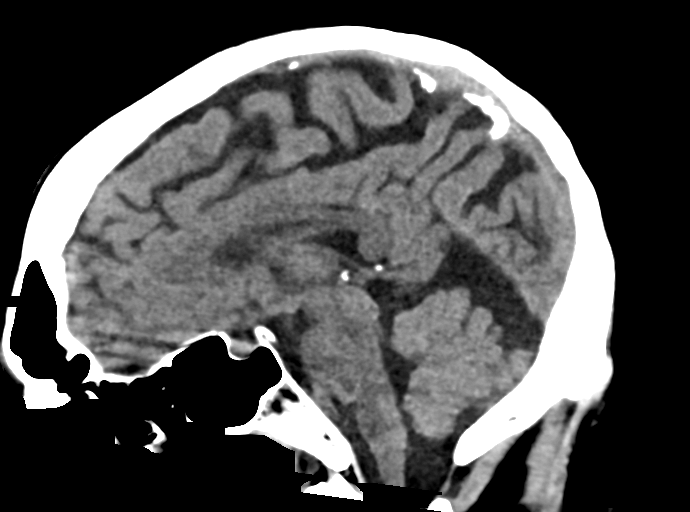
[im 36/58  brain]
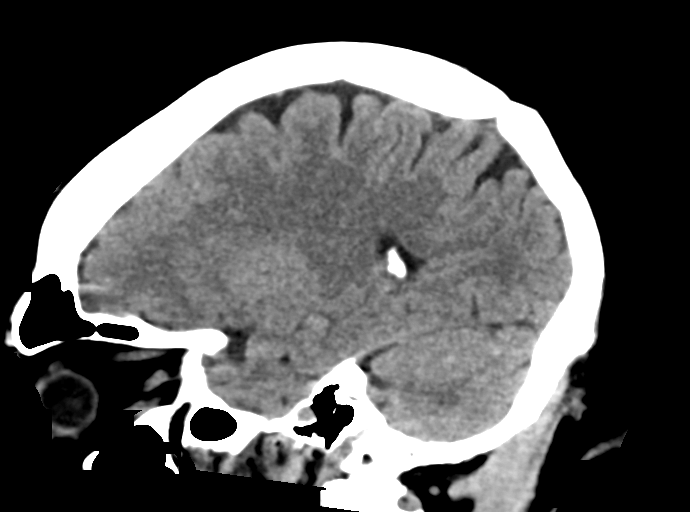

[16 of 47 positions shown; findings below may reference images not displayed]

FINDINGS: Brain: No acute territorial infarction, hemorrhage or intracranial
mass. Mild atrophy. Nonenlarged ventricles.

Vascular: No hyperdense vessels.  Carotid vascular calcification.

Skull: Normal. Negative for fracture or focal lesion. Small defects
within the parietal bones posteriorly.

Sinuses/Orbits: No acute finding.

Other: None
IMPRESSION: 1. No CT evidence for acute intracranial abnormality.
2. Mild atrophy.

## 2023-08-20 IMAGING — CT CT CHEST-ABD-PELV W/ CM
2 of 5 series · 12 of 46 positions shown, 14 images · IV contrast (APPLIED)
Comparison: 06/14/2005 abdomen and pelvis CT

CLINICAL DATA: 65-year-old male with unintended weight loss and
recent colonoscopy demonstrating a partially obstructing ascending
colonic mass with biopsies demonstrating tubular villous adenoma.

EXAM:
CT CHEST, ABDOMEN, AND PELVIS WITH CONTRAST
TECHNIQUE: Multidetector CT imaging of the chest, abdomen and pelvis was
performed following the standard protocol during bolus
administration of intravenous contrast.

[Series 2: cap with · axial · 0.72mm/px · z∈[-881,-361]mm · 9 of 131 slices shown, 11 images]
[im 14/131  soft-tissue]
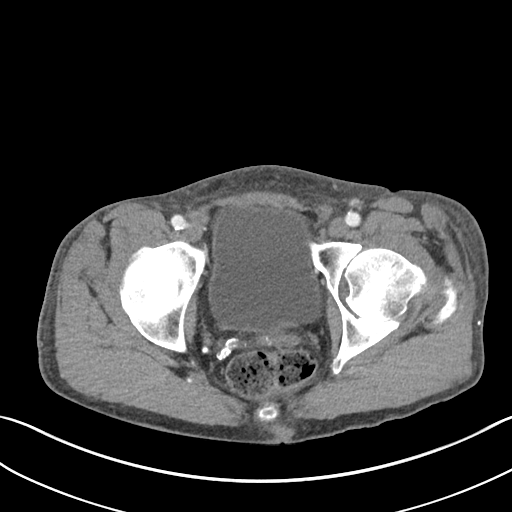
[im 14/131  bone]
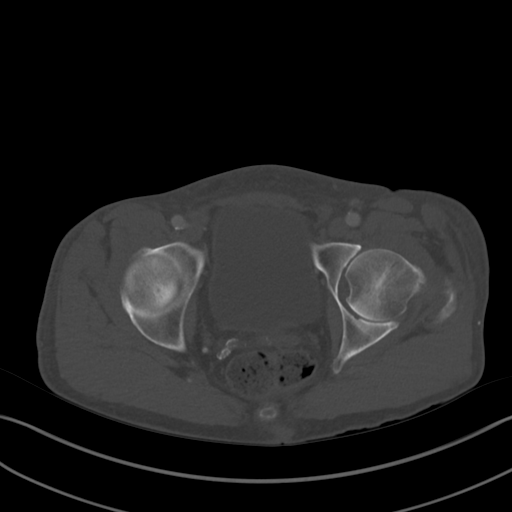
[im 27/131  soft-tissue]
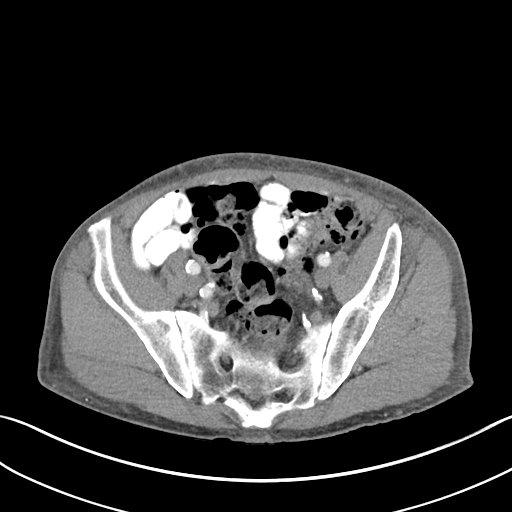
[im 40/131  soft-tissue]
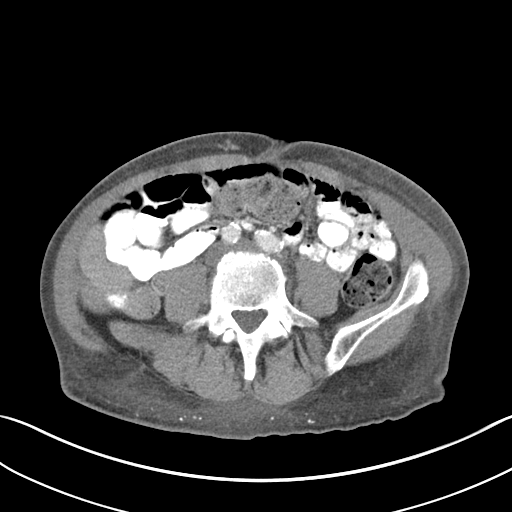
[im 53/131  soft-tissue]
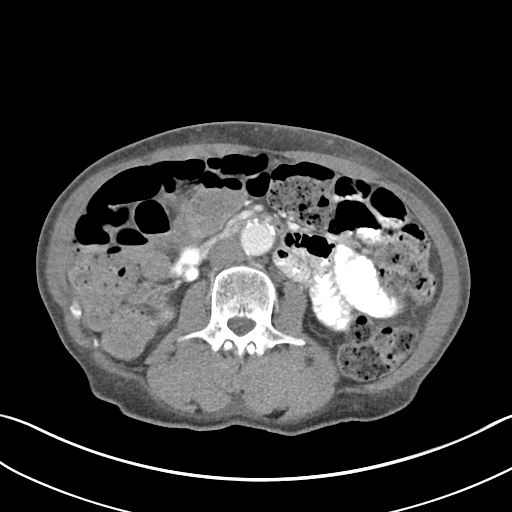
[im 66/131  soft-tissue]
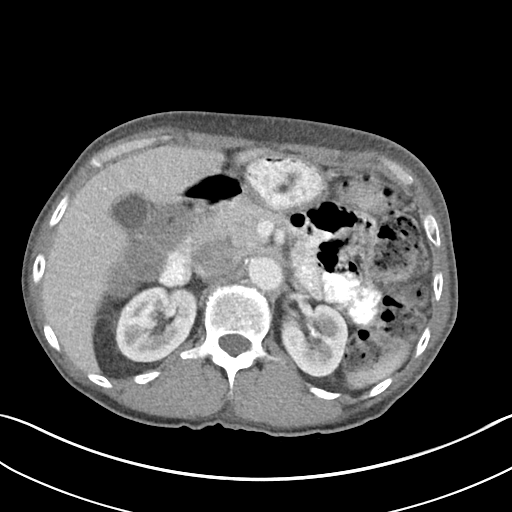
[im 79/131  soft-tissue]
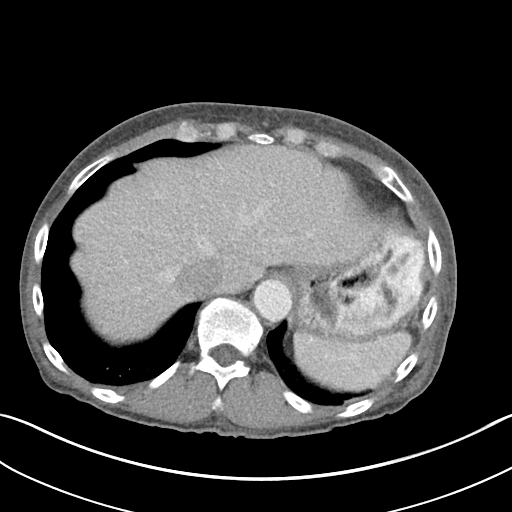
[im 92/131  soft-tissue]
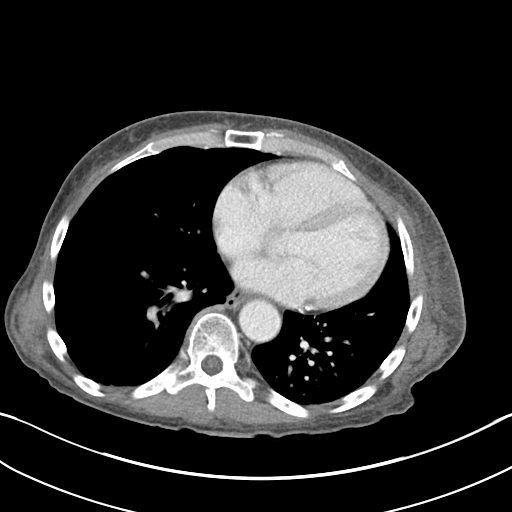
[im 105/131  soft-tissue]
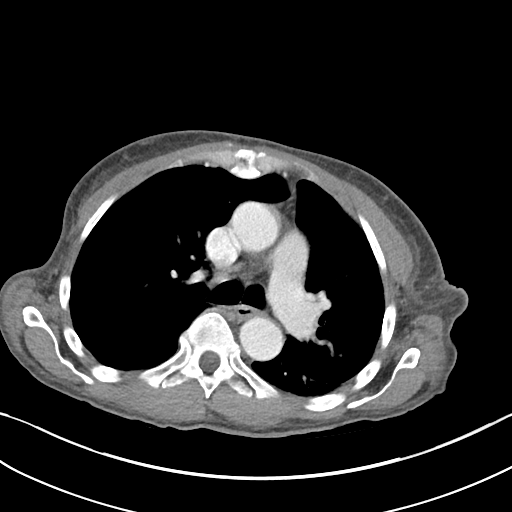
[im 118/131  soft-tissue]
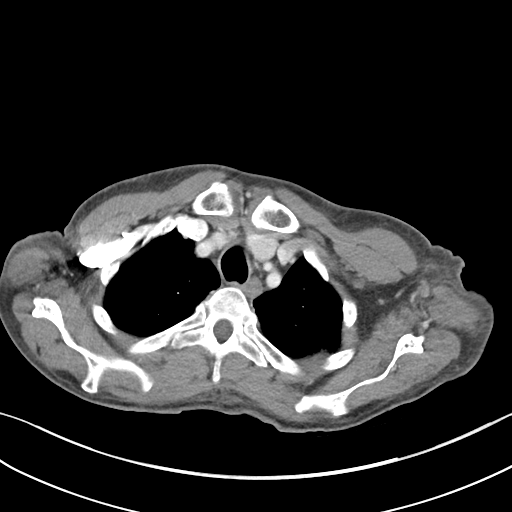
[im 118/131  bone]
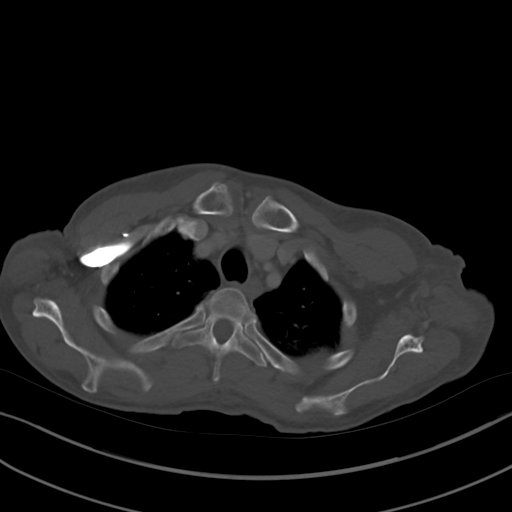

[Series 5: cor · coronal · 0.72mm/px · 3 of 84 slices shown]
[im 28/84  soft-tissue]
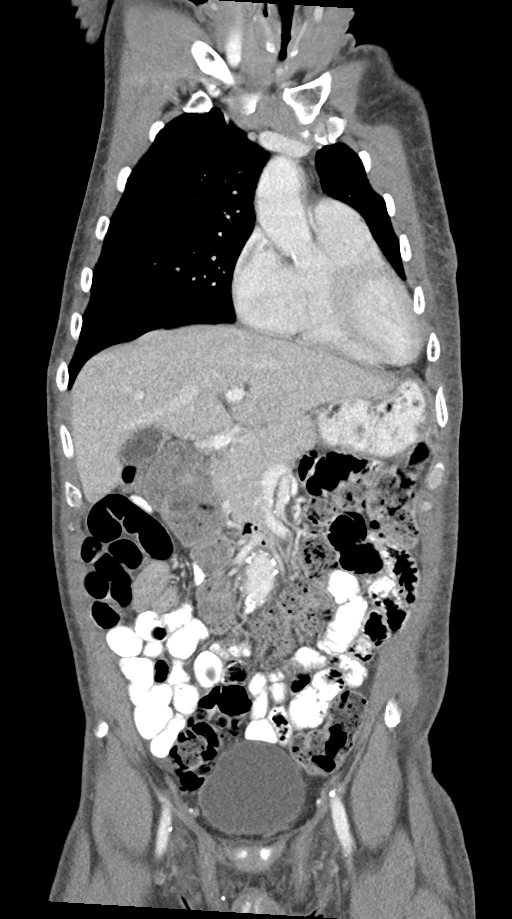
[im 37/84  soft-tissue]
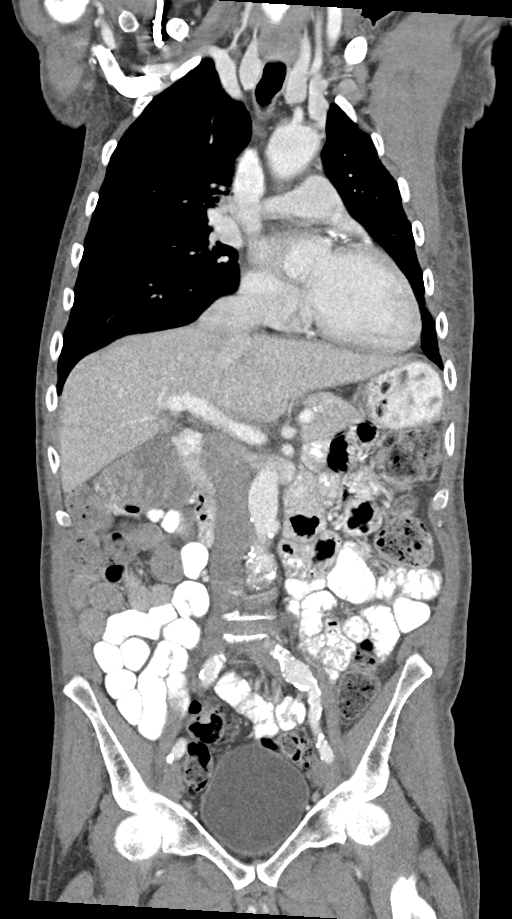
[im 47/84  soft-tissue]
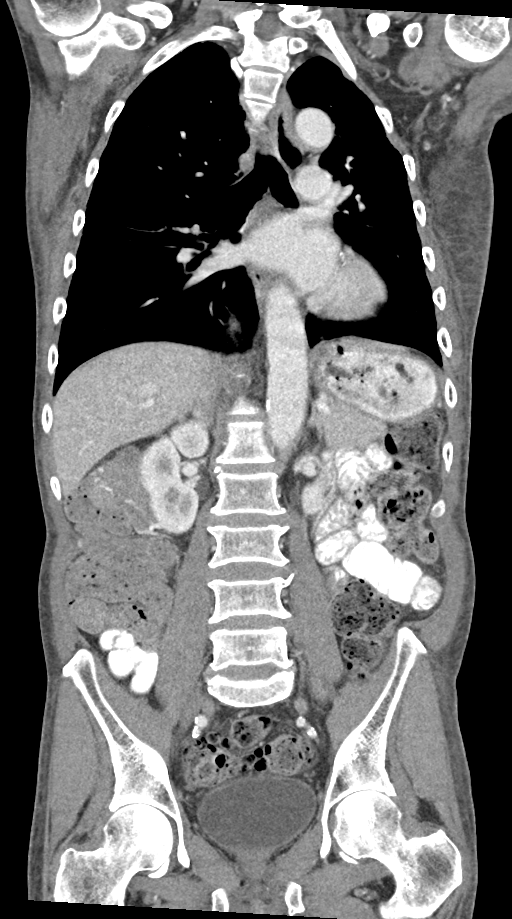

[12 of 46 positions shown; findings below may reference images not displayed]

RADIATION DOSE REDUCTION: This exam was performed according to the
departmental dose-optimization program which includes automated
exposure control, adjustment of the mA and/or kV according to
patient size and/or use of iterative reconstruction technique.

CONTRAST:  100mL OMNIPAQUE IOHEXOL 300 MG/ML  SOLN
FINDINGS: CT CHEST FINDINGS

Cardiovascular: Heart size is normal. Heavy coronary artery
atherosclerotic calcifications are present. Mild aortic
atherosclerotic calcifications noted without thoracic aortic
aneurysm. No pericardial effusion is identified.

Mediastinum/Nodes: No enlarged mediastinal, hilar, or axillary lymph
nodes. Thyroid gland, trachea, and esophagus demonstrate no
significant findings.

Lungs/Pleura: Moderate emphysema is noted, primarily centrilobular
in greatest in the UPPER lungs. There is no evidence of airspace
disease, mass, suspicious nodule, consolidation, pleural effusion or
pneumothorax. Mild dependent opacities are noted, greatest in the
LOWER lobes and LEFT greater than RIGHT, question atelectasis versus
interstitial lung disease.

Musculoskeletal: No acute or suspicious bony abnormalities are
noted.

CT ABDOMEN PELVIS FINDINGS

Hepatobiliary: The liver and gallbladder are unremarkable. There is
no evidence of intrahepatic or extrahepatic biliary dilatation.

Pancreas: Unremarkable

Spleen: Unremarkable

Adrenals/Urinary Tract: Nonobstructing bilateral renal calculi are
identified, mostly punctate. The largest calculus measuring 4 mm in
the mid LEFT kidney. There is no evidence of hydronephrosis or renal
mass. The adrenal glands and bladder are unremarkable.

Stomach/Bowel: A moderate to large amount of stool throughout the
colon is noted. There is a suggestion of soft tissue fullness
measuring approximately 5.5 cm within the UPPER ascending colon
which may represent the mass identified on colonoscopy. There is
however limited evaluation from stool burden. The remainder of the
bowel is unremarkable. There is no evidence of bowel obstruction.
The stomach and appendix appear normal.

Vascular/Lymphatic: A 3.4 cm fusiform infrarenal abdominal aortic
aneurysm is now noted. Aortic atherosclerotic calcifications are
present.

No enlarged lymph nodes are identified.

Reproductive: Prostate is unremarkable.

Other: A trace amount of free pelvic fluid is noted. There is no
evidence of pneumoperitoneum or focal collection.

Musculoskeletal: No acute or suspicious bony abnormalities are
identified. Degenerative disc disease/mild spondylosis at L5-S1
noted.
IMPRESSION: 1. Suggestion of 5.4 cm soft tissue fullness within the UPPER
ascending colon which may represent the mass identified on
colonoscopy, but limited evaluation due to large stool burden. No
evidence of abnormal lymph nodes or metastatic disease.
2. 3.4 cm infrarenal abdominal aortic aneurysm. Recommend follow-up
ultrasound every 3 years. This recommendation follows ACR consensus
guidelines: White Paper of the ACR Incidental Findings Committee II
on Vascular Findings. [HOSPITAL] 6479; [DATE].
3. Mild dependent opacities within the lungs, greatest in the LOWER
lobes and LEFT greater than RIGHT, question atelectasis versus
interstitial lung disease.
4. Nonobstructing bilateral renal calculi.
5. Heavy coronary artery disease.
6. Aortic Atherosclerosis ([id number].V) and Emphysema ([id number]).

## 2023-12-04 ENCOUNTER — Other Ambulatory Visit: Payer: Self-pay

## 2023-12-04 ENCOUNTER — Inpatient Hospital Stay (HOSPITAL_COMMUNITY)
Admission: EM | Admit: 2023-12-04 | Discharge: 2023-12-09 | DRG: 689 | Disposition: A | Discharge status: Skilled Nursing Facility | Source: Skilled Nursing Facility | Attending: Internal Medicine | Admitting: Internal Medicine

## 2023-12-04 ENCOUNTER — Emergency Department (HOSPITAL_COMMUNITY)

## 2023-12-04 ENCOUNTER — Encounter: Payer: Self-pay | Admitting: Oncology

## 2023-12-04 ENCOUNTER — Encounter (HOSPITAL_COMMUNITY): Payer: Self-pay | Admitting: Radiology

## 2023-12-04 DIAGNOSIS — Z91148 Patient's other noncompliance with medication regimen for other reason: Secondary | ICD-10-CM

## 2023-12-04 DIAGNOSIS — Z85038 Personal history of other malignant neoplasm of large intestine: Secondary | ICD-10-CM

## 2023-12-04 DIAGNOSIS — R680 Hypothermia, not associated with low environmental temperature: Secondary | ICD-10-CM | POA: Diagnosis present

## 2023-12-04 DIAGNOSIS — N390 Urinary tract infection, site not specified: Secondary | ICD-10-CM | POA: Diagnosis present

## 2023-12-04 DIAGNOSIS — Z79899 Other long term (current) drug therapy: Secondary | ICD-10-CM | POA: Diagnosis not present

## 2023-12-04 DIAGNOSIS — Z6823 Body mass index (BMI) 23.0-23.9, adult: Secondary | ICD-10-CM

## 2023-12-04 DIAGNOSIS — Z8042 Family history of malignant neoplasm of prostate: Secondary | ICD-10-CM | POA: Diagnosis not present

## 2023-12-04 DIAGNOSIS — R4182 Altered mental status, unspecified: Secondary | ICD-10-CM | POA: Diagnosis present

## 2023-12-04 DIAGNOSIS — F22 Delusional disorders: Secondary | ICD-10-CM | POA: Diagnosis not present

## 2023-12-04 DIAGNOSIS — T68XXXA Hypothermia, initial encounter: Secondary | ICD-10-CM | POA: Diagnosis present

## 2023-12-04 DIAGNOSIS — T426X5A Adverse effect of other antiepileptic and sedative-hypnotic drugs, initial encounter: Secondary | ICD-10-CM | POA: Diagnosis present

## 2023-12-04 DIAGNOSIS — F17210 Nicotine dependence, cigarettes, uncomplicated: Secondary | ICD-10-CM | POA: Diagnosis present

## 2023-12-04 DIAGNOSIS — F209 Schizophrenia, unspecified: Secondary | ICD-10-CM | POA: Diagnosis present

## 2023-12-04 DIAGNOSIS — E441 Mild protein-calorie malnutrition: Secondary | ICD-10-CM | POA: Diagnosis present

## 2023-12-04 DIAGNOSIS — R4689 Other symptoms and signs involving appearance and behavior: Secondary | ICD-10-CM | POA: Diagnosis present

## 2023-12-04 DIAGNOSIS — Z9181 History of falling: Secondary | ICD-10-CM | POA: Diagnosis not present

## 2023-12-04 DIAGNOSIS — D649 Anemia, unspecified: Secondary | ICD-10-CM | POA: Diagnosis present

## 2023-12-04 DIAGNOSIS — F05 Delirium due to known physiological condition: Secondary | ICD-10-CM | POA: Diagnosis present

## 2023-12-04 DIAGNOSIS — R410 Disorientation, unspecified: Secondary | ICD-10-CM | POA: Diagnosis not present

## 2023-12-04 DIAGNOSIS — I10 Essential (primary) hypertension: Secondary | ICD-10-CM | POA: Diagnosis present

## 2023-12-04 DIAGNOSIS — F17200 Nicotine dependence, unspecified, uncomplicated: Secondary | ICD-10-CM | POA: Diagnosis present

## 2023-12-04 DIAGNOSIS — Z8249 Family history of ischemic heart disease and other diseases of the circulatory system: Secondary | ICD-10-CM

## 2023-12-04 DIAGNOSIS — F201 Disorganized schizophrenia: Secondary | ICD-10-CM | POA: Diagnosis not present

## 2023-12-04 DIAGNOSIS — G9341 Metabolic encephalopathy: Secondary | ICD-10-CM | POA: Diagnosis present

## 2023-12-04 DIAGNOSIS — Z8659 Personal history of other mental and behavioral disorders: Secondary | ICD-10-CM | POA: Diagnosis not present

## 2023-12-04 DIAGNOSIS — R001 Bradycardia, unspecified: Secondary | ICD-10-CM | POA: Diagnosis present

## 2023-12-04 DIAGNOSIS — Z8616 Personal history of COVID-19: Secondary | ICD-10-CM

## 2023-12-04 DIAGNOSIS — F29 Unspecified psychosis not due to a substance or known physiological condition: Principal | ICD-10-CM | POA: Diagnosis present

## 2023-12-04 DIAGNOSIS — Z801 Family history of malignant neoplasm of trachea, bronchus and lung: Secondary | ICD-10-CM

## 2023-12-04 DIAGNOSIS — F250 Schizoaffective disorder, bipolar type: Secondary | ICD-10-CM | POA: Diagnosis present

## 2023-12-04 DIAGNOSIS — E876 Hypokalemia: Secondary | ICD-10-CM | POA: Diagnosis present

## 2023-12-04 DIAGNOSIS — F203 Undifferentiated schizophrenia: Secondary | ICD-10-CM | POA: Diagnosis not present

## 2023-12-04 LAB — CBC WITH DIFFERENTIAL/PLATELET
Abs Immature Granulocytes: 0.01 10*3/uL (ref 0.00–0.07)
Basophils Absolute: 0 10*3/uL (ref 0.0–0.1)
Basophils Relative: 0 %
Eosinophils Absolute: 0 10*3/uL (ref 0.0–0.5)
Eosinophils Relative: 1 %
HCT: 42.4 % (ref 39.0–52.0)
Hemoglobin: 13.1 g/dL (ref 13.0–17.0)
Immature Granulocytes: 0 %
Lymphocytes Relative: 30 %
Lymphs Abs: 1.5 10*3/uL (ref 0.7–4.0)
MCH: 28.6 pg (ref 26.0–34.0)
MCHC: 30.9 g/dL (ref 30.0–36.0)
MCV: 92.6 fL (ref 80.0–100.0)
Monocytes Absolute: 0.6 10*3/uL (ref 0.1–1.0)
Monocytes Relative: 12 %
Neutro Abs: 2.8 10*3/uL (ref 1.7–7.7)
Neutrophils Relative %: 57 %
Platelets: 118 10*3/uL — ABNORMAL LOW (ref 150–400)
RBC: 4.58 MIL/uL (ref 4.22–5.81)
RDW: 14.4 % (ref 11.5–15.5)
WBC: 5 10*3/uL (ref 4.0–10.5)
nRBC: 0 % (ref 0.0–0.2)

## 2023-12-04 LAB — URINALYSIS, ROUTINE W REFLEX MICROSCOPIC
Bilirubin Urine: NEGATIVE
Glucose, UA: NEGATIVE mg/dL
Hgb urine dipstick: NEGATIVE
Ketones, ur: NEGATIVE mg/dL
Nitrite: NEGATIVE
Protein, ur: NEGATIVE mg/dL
Specific Gravity, Urine: 1.014 (ref 1.005–1.030)
WBC, UA: >50 WBC/hpf (ref 0–5)
pH: 7 (ref 5.0–8.0)

## 2023-12-04 LAB — COMPREHENSIVE METABOLIC PANEL WITH GFR
ALT: 9 U/L (ref 0–44)
AST: 13 U/L — ABNORMAL LOW (ref 15–41)
Albumin: 3.3 g/dL — ABNORMAL LOW (ref 3.5–5.0)
Alkaline Phosphatase: 73 U/L (ref 38–126)
Anion gap: 11 (ref 5–15)
BUN: 12 mg/dL (ref 8–23)
CO2: 22 mmol/L (ref 22–32)
Calcium: 9.6 mg/dL (ref 8.9–10.3)
Chloride: 108 mmol/L (ref 98–111)
Creatinine, Ser: 1.03 mg/dL (ref 0.61–1.24)
GFR, Estimated: >60 mL/min (ref >60)
Glucose, Bld: 122 mg/dL — ABNORMAL HIGH (ref 70–99)
Potassium: 3.3 mmol/L — ABNORMAL LOW (ref 3.5–5.1)
Sodium: 141 mmol/L (ref 135–145)
Total Bilirubin: 0.5 mg/dL (ref 0.0–1.2)
Total Protein: 6.4 g/dL — ABNORMAL LOW (ref 6.5–8.1)

## 2023-12-04 LAB — MRSA NEXT GEN BY PCR, NASAL: MRSA by PCR Next Gen: NOT DETECTED

## 2023-12-04 LAB — CBG MONITORING, ED: Glucose-Capillary: 91 mg/dL (ref 70–99)

## 2023-12-04 MED ORDER — HALOPERIDOL LACTATE 5 MG/ML IJ SOLN: 5.0000 mg | Freq: Four times a day (QID) | INTRAMUSCULAR | Status: DC | PRN

## 2023-12-04 MED ORDER — ENOXAPARIN SODIUM 40 MG/0.4ML IJ SOSY
40.0000 mg | PREFILLED_SYRINGE | INTRAMUSCULAR | Status: DC
  Filled 2023-12-04 (×4): qty 0.4

## 2023-12-04 MED ORDER — ACETAMINOPHEN 325 MG PO TABS
650.0000 mg | ORAL_TABLET | Freq: Four times a day (QID) | ORAL | Status: DC | PRN
Start: 2023-12-04 — End: 2023-12-09

## 2023-12-04 MED ORDER — ORAL CARE MOUTH RINSE: 15.0000 mL | OROMUCOSAL | Status: DC | PRN

## 2023-12-04 MED ORDER — DIPHENHYDRAMINE HCL 50 MG/ML IJ SOLN
50.0000 mg | Freq: Once | INTRAMUSCULAR | Status: AC
  Administered 2023-12-04: 50 mg via INTRAMUSCULAR
  Filled 2023-12-04: qty 1

## 2023-12-04 MED ORDER — SODIUM CHLORIDE 0.9 % IV BOLUS
1000.0000 mL | Freq: Once | INTRAVENOUS | Status: AC
  Administered 2023-12-04: 1000 mL via INTRAVENOUS

## 2023-12-04 MED ORDER — SODIUM CHLORIDE 0.9 % IV SOLN
2.0000 g | INTRAVENOUS | Status: DC
  Administered 2023-12-05 – 2023-12-07 (×3): 2 g via INTRAVENOUS
  Filled 2023-12-04 (×3): qty 20

## 2023-12-04 MED ORDER — ACETAMINOPHEN 650 MG RE SUPP: 650.0000 mg | Freq: Four times a day (QID) | RECTAL | Status: DC | PRN

## 2023-12-04 MED ORDER — LORAZEPAM 2 MG/ML IJ SOLN: 1.0000 mg | INTRAMUSCULAR | Status: DC | PRN

## 2023-12-04 MED ORDER — POTASSIUM CHLORIDE IN NACL 40-0.9 MEQ/L-% IV SOLN
INTRAVENOUS | Status: AC
  Filled 2023-12-04 (×2): qty 1000

## 2023-12-04 MED ORDER — CHLORHEXIDINE GLUCONATE CLOTH 2 % EX PADS
6.0000 | MEDICATED_PAD | Freq: Every day | CUTANEOUS | Status: DC
  Administered 2023-12-04 – 2023-12-05 (×2): 6 via TOPICAL

## 2023-12-04 MED ORDER — ONDANSETRON HCL 4 MG PO TABS: 4.0000 mg | ORAL_TABLET | Freq: Four times a day (QID) | ORAL | Status: DC | PRN

## 2023-12-04 MED ORDER — SODIUM CHLORIDE 0.9 % IV SOLN
1.0000 g | Freq: Once | INTRAVENOUS | Status: AC
  Administered 2023-12-04: 1 g via INTRAVENOUS
  Filled 2023-12-04: qty 10

## 2023-12-04 MED ORDER — HALOPERIDOL LACTATE 5 MG/ML IJ SOLN
5.0000 mg | Freq: Once | INTRAMUSCULAR | Status: AC
  Administered 2023-12-04: 5 mg via INTRAMUSCULAR
  Filled 2023-12-04: qty 1

## 2023-12-04 MED ORDER — ONDANSETRON HCL 4 MG/2ML IJ SOLN: 4.0000 mg | Freq: Four times a day (QID) | INTRAMUSCULAR | Status: DC | PRN

## 2023-12-04 MED ORDER — LORAZEPAM 2 MG/ML IJ SOLN
2.0000 mg | Freq: Once | INTRAMUSCULAR | Status: AC
  Administered 2023-12-04: 2 mg via INTRAMUSCULAR
  Filled 2023-12-04: qty 1

## 2023-12-04 NOTE — ED Notes (Addendum)
 Pt refused suicide assessment

## 2023-12-04 NOTE — ED Provider Notes (Signed)
 [facility]GENC[facility] DEPARTMENT AT [facility] Provider Note   CSN: [id number] Arrival date & time: 12/04/23  0545     History Chief Complaint  Patient presents with   Agitation   Mental Health Problem    HPI Dustin [name] is a 68 y.o. male presenting for altered mental status.  Coming from local skilled nursing facility where this morning he was agitated, nonredirectable, attacking staff.  History UTIs that are caused similar presentations in the past called family who state that they have not seen him in a few days. They were unaware that he had been agitated this morning. Per nursing facility report, he has not been taking his medications over the last few days.  Patient with multiple comorbid medical problems.  He is too disoriented to answer questions.  Repeating expletives and similar behaviors at this time.  Patient's recorded medical, surgical, social, medication list and allergies were reviewed in the Snapshot window as part of the initial history.   Review of Systems   Review of Systems  Unable to perform ROS: Psychiatric disorder    Physical Exam Updated Vital Signs BP 102/75 (BP Location: Left Arm)   Pulse 77   SpO2 100%  Physical Exam Vitals and nursing note reviewed.  Constitutional:      General: He is not in acute distress.    Appearance: He is well-developed.  HENT:     Head: Normocephalic and atraumatic.   Eyes:     Conjunctiva/sclera: Conjunctivae normal.    Cardiovascular:     Rate and Rhythm: Normal rate and regular rhythm.     Heart sounds: No murmur heard. Pulmonary:     Effort: Pulmonary effort is normal. No respiratory distress.     Breath sounds: Normal breath sounds.  Abdominal:     Palpations: Abdomen is soft.     Tenderness: There is no abdominal tenderness.   Musculoskeletal:        General: No swelling.     Cervical back: Neck supple.   Skin:    General: Skin is warm and dry.     Capillary Refill: Capillary  refill takes less than 2 seconds.   Neurological:     Mental Status: He is alert.   Psychiatric:        Mood and Affect: Mood normal.      ED Course/ Medical Decision Making/ A&P    Procedures Procedures   Medications Ordered in ED Medications  haloperidol  lactate (HALDOL ) injection 5 mg (5 mg Intramuscular Given 12/04/23 0603)   Medical Decision Making:   Dustin [name] is a 68 y.o. male who presented to the ED today with altered mental status detailed above.    Handoff received from EMS.  Additional history discussed with patient's family/caregivers.  Patient placed on continuous vitals and telemetry monitoring while in ED which was reviewed periodically.  Complete initial physical exam performed, notably the patient  was hemodynamically stable no acute distress..    Reviewed and confirmed nursing documentation for past medical history, family history, social history.    Initial Assessment:   With the patient's presentation of altered mental status, most likely diagnosis is delerium 2/2 infectious etiology (UTI/CAP/URI) vs metabolic abnormality (Na/K/Mg/Ca) vs nonspecific etiology. Other diagnoses were considered including (but not limited to) CVA, ICH, intracranial mass, critical dehydration, heptatic dysfunction, uremia, hypercarbia, intoxication, endrocrine abnormality, toxidrome. These are considered less likely due to history of present illness and physical exam findings.   This is most consistent with  an acute life/limb threatening illness complicated by underlying chronic conditions.  Initial Plan:  CTH to evaluate for intracranial etiology of patient's symptoms  Screening labs including CBC and Metabolic panel to evaluate for infectious or metabolic etiology of disease.  Urinalysis with reflex culture ordered to evaluate for UTI or relevant urologic/nephrologic pathology.  CXR to evaluate for structural/infectious intrathoracic pathology.  EKG to evaluate for cardiac  pathology  Reassessment and Plan:   Discussed patient's presentation with his family. They feel comfortable with him returning to his facility if our workup today is negative for any acute pathology.  They state he has had substantial behavioral concerns at the facility due to not taking his medications. He was treated with IM Haldol  while here in the emergency room with ongoing moderate improvement in his mental status. Care patient handed off to oncoming team pending the above metabolic/CNS workup.  Clinical Impression:  1. Psychosis, unspecified psychosis type (HCC)   2. Noncompliance with medication regimen      Data Unavailable   Final Clinical Impression(s) / ED Diagnoses Final diagnoses:  Psychosis, unspecified psychosis type (HCC)  Noncompliance with medication regimen    Rx / DC Orders ED Discharge Orders     None         Jerral Meth, MD 12/04/23 [phone]

## 2023-12-04 NOTE — ED Notes (Signed)
 Pt sleeping/ sedated, last sedating meds at 0915, NAD, calm. Arousable to physical stimuli, remains agitated. Bair hugger continues. Temp warmer/ improved.

## 2023-12-04 NOTE — ED Notes (Signed)
 MAP 75, will monitor, sleeping

## 2023-12-04 NOTE — BH Assessment (Addendum)
 Secure chat message sent to RN, Jenna.  Hi, checking to see if this patient is being admitted medically? If so, would you mind removing the TTS Consult and entering a Psych Consult if [facility] services are needed?

## 2023-12-04 NOTE — ED Notes (Addendum)
 EDP into see. Pt irritable, rude, uncooperative, reactive, volatile, otherwise resting calmly. Plan explained with rationale. Options offered. Pending initiation of IVC.

## 2023-12-04 NOTE — ED Notes (Signed)
 Dr. Jerral notified of rectal temp of 95.6. Dr. Jerral stated to place warm blankets on patient and recheck.

## 2023-12-04 NOTE — ED Triage Notes (Signed)
 Pt presents to ED via EMS from [facility]. Nurse from facility states pt has hx of psychosis and schizophrenia and has not been on meds. Pt is verbally assaulting staff at the time of triage and refuses to answer orientation questions.

## 2023-12-04 NOTE — H&P (Signed)
 History and Physical    Patient: Dustin [name] FMW:[id number] DOB: 08-30-1955 DOA: 12/04/2023 DOS: the patient was seen and examined on 12/04/2023 PCP: Vicci Barnie NOVAK, MD  Patient coming from: Home  Chief Complaint:  Chief Complaint  Patient presents with   Agitation   Mental Health Problem   HPI: Dustin [name] is a 68 y.o. male with medical history significant of schizophrenia, sinus bradycardia, tobacco use disorder, adenocarcinoma of the colon, COVID-19, hypotension, aspiration pneumonia, bacteremia, history of metabolic encephalopathy in the setting of UTI.  He is currently sedated and unable to provide further information.   Lab work: Urinalysis showed large leukocyte esterase, many bacteria and greater than 50 WBC.  CBC showed white count 5.0, hemoglobin 13.1 g/dL platelets 881.  CMP showed a potassium of 3.3 mmol/L, the rest of the electrolytes and renal function were normal.  Glucose iron 22 mg/dL, AST 13 units/L, total protein 6.4 and albumin 3.3 g/dL.  ALT, alkaline phosphatase and total bilirubin were normal.  Imaging: Portable 1 view chest radiograph demonstrated emphysema and chronic appearing reticular changes in the left lung, may reflect residual lung disease.  Otherwise no acute cardiopulmonary findings.  CT head without contrast was negative.   ED course: Initial vital signs were temperature 95.6 F, pulse 77, respiration 14, BP 102/75 and O2 sat 100%.  The patient received ceftriaxone  1 g IVPB x 1 lorazepam  2 mg IM, haloperidol  5 mg IM and Benadryl  50 mg IM.  Review of Systems: As mentioned in the history of present illness. All other systems reviewed and are negative. Past Medical History:  Diagnosis Date   Schizophrenia [facility]    Past Surgical History:  Procedure Laterality Date   COLON SURGERY     COLONOSCOPY N/A 10/06/2021   Procedure: COLONOSCOPY;  Surgeon: Toledo, Ladell POUR, MD;  Location: [facility] ENDOSCOPY;  Service: Gastroenterology;  Laterality: N/A;    ESOPHAGOGASTRODUODENOSCOPY N/A 10/06/2021   Procedure: ESOPHAGOGASTRODUODENOSCOPY (EGD);  Surgeon: Toledo, Ladell POUR, MD;  Location: [facility] ENDOSCOPY;  Service: Gastroenterology;  Laterality: N/A;   PARTIAL COLECTOMY  10/13/2021   Procedure: RIGHT COLECTOMY;  Surgeon: Jordis Laneta FALCON, MD;  Location: [facility] ORS;  Service: General;;   Social History:  reports that he has been smoking cigarettes. He has a 7.5 pack-year smoking history. He has never used smokeless tobacco. He reports that he does not currently use alcohol. He reports that he does not use drugs.  No Known Allergies  Family History  Problem Relation Age of Onset   Lung cancer Father    Hypertension Brother    Hypertension Brother    Hypertension Brother    Prostate cancer Brother     Prior to Admission medications   Medication Sig Start Date End Date Taking? Authorizing Provider  haloperidol  (HALDOL ) 2 MG tablet Take 1 tablet (2 mg total) by mouth every 8 (eight) hours as needed for agitation (anxiety). 03/02/23   Ghimire, Donalda HERO, MD  midodrine  (PROAMATINE ) 2.5 MG tablet Take 1 tablet (2.5 mg total) by mouth 3 (three) times daily with meals. 11/23/22   Jillian Buttery, MD  paliperidone  (INVEGA  SUSTENNA) 156 MG/ML SUSY injection Inject 1 mL (156 mg total) into the muscle once for 1 dose. 03/08/23 03/08/23  Ghimire, Donalda HERO, MD  paliperidone  (INVEGA  SUSTENNA) 234 MG/1.5ML injection Inject 234 mg into the muscle every 28 (twenty-eight) days. 04/05/23   Ghimire, Donalda HERO, MD  paliperidone  (INVEGA ) 3 MG 24 hr tablet Take 1 tablet (3 mg total) by mouth at bedtime. 03/02/23  Raenelle Donalda HERO, MD    Physical Exam: Vitals:   12/04/23 1300 12/04/23 1315 12/04/23 1330 12/04/23 1345  BP: 95/68 103/71 92/70 (!) 84/61  Pulse: (!) 43 (!) 51 (!) 48 (!) 42  Resp: 11 11 10 10   Temp:      TempSrc:      SpO2: 100% 99% 99% 99%   Physical Exam Vitals reviewed.  Constitutional:      General: He is awake.     Appearance: He is  ill-appearing.  HENT:     Head: Normocephalic.     Nose: No rhinorrhea.     Mouth/Throat:     Mouth: Mucous membranes are dry.   Eyes:     General: No scleral icterus.    Pupils: Pupils are equal, round, and reactive to light.   Neck:     Vascular: No JVD.   Cardiovascular:     Rate and Rhythm: Regular rhythm. Bradycardia present.     Heart sounds: S1 normal and S2 normal.  Pulmonary:     Effort: Pulmonary effort is normal.     Breath sounds: Normal breath sounds. No wheezing, rhonchi or rales.  Abdominal:     General: There is no distension.     Palpations: Abdomen is soft.     Tenderness: There is no abdominal tenderness. There is no guarding.   Musculoskeletal:     Cervical back: Neck supple.     Right lower leg: No edema.     Left lower leg: No edema.   Skin:    General: Skin is warm and dry.   Neurological:     General: No focal deficit present.     Mental Status: He is alert and oriented to person, place, and time.   Psychiatric:        Mood and Affect: Mood normal.        Behavior: Behavior normal. Behavior is cooperative.     Data Reviewed:  Results are pending, will review when available. 11/13/2022 echocardiogram report. IMPRESSIONS:   1. Left ventricular ejection fraction, by estimation, is 55 to 60%. The  left ventricle has normal function. The left ventricle has no regional  wall motion abnormalities. Left ventricular diastolic parameters were  normal.   2. Right ventricular systolic function is normal. The right ventricular  size is normal. There is normal pulmonary artery systolic pressure.   3. Left atrial size was moderately dilated.   4. Right atrial size was moderately dilated.   5. The mitral valve is normal in structure. No evidence of mitral valve  regurgitation. No evidence of mitral stenosis.   6. The tricuspid valve is abnormal.   7. The aortic valve is tricuspid. Aortic valve regurgitation is not  visualized. No aortic stenosis is  present.   8. The inferior vena cava is dilated in size with >50% respiratory  variability, suggesting right atrial pressure of 8 mmHg.   EKG: Vent. rate 44 BPM PR interval 220 ms QRS duration 89 ms QT/QTcB 465/398 ms P-R-T axes 73 55 6 Sinus bradycardia Left ventricular hypertrophy  Assessment and Plan: Principal Problem:   Schizophrenia (HCC) Presenting with:   Metabolic encephalopathy Associated with:   Aggressive behavior In the setting of:   Acute UTI (urinary tract infection) Admit to stepdown/inpatient. Keep NPO for now. May resume diet once more alert. Continue IV fluids. Continue Haldol  5 mg IVP every 6 hours as needed. Continue lorazepam  1 mg IVP every 4 hours as needed. Continue ceftriaxone  1 g  IVPB daily.. Follow-up urine culture and sensitivity.  Active Problems:   Sinus bradycardia Monitor heart rate.    Hypothermia Continue warming measures.    Hypokalemia Replacing.    Mild protein malnutrition (HCC) In the setting of anemia and malignancy. May benefit from protein supplementation. Consider nutritional services evaluation. Follow-up albumin level.    Tobacco use disorder Nicotine  replacement therapy as needed.     Advance Care Planning:   Code Status: Full Code   Consults:   Family Communication:   Severity of Illness: The appropriate patient status for this patient is INPATIENT. Inpatient status is judged to be reasonable and necessary in order to provide the required intensity of service to ensure the patient's safety. The patient's presenting symptoms, physical exam findings, and initial radiographic and laboratory data in the context of their chronic comorbidities is felt to place them at high risk for further clinical deterioration. Furthermore, it is not anticipated that the patient will be medically stable for discharge from the hospital within 2 midnights of admission.   * I certify that at the point of admission it is my clinical  judgment that the patient will require inpatient hospital care spanning beyond 2 midnights from the point of admission due to high intensity of service, high risk for further deterioration and high frequency of surveillance required.*  Author: Alm Dorn Castor, MD 12/04/2023 2:18 PM  For on call review [email or web address].   This document was prepared using Dragon voice recognition software and may contain some unintended transcription errors.

## 2023-12-04 NOTE — ED Provider Notes (Signed)
 NP attempted to assess face to face- patient very irritable and demanded me to get out of his room, and asked me not to come fucking with him, early this morning. After this provider introduced self, he stated  I dont need no damn psychiatry. RN reported patient had just been given ativan  and benadryl . Will follow-up later today.

## 2023-12-04 NOTE — ED Notes (Signed)
 VSS, SB on monitor, HR 44, BP soft, EKG completed, NAD, calm, resting/ sleeping/ sedated.

## 2023-12-04 NOTE — ED Notes (Signed)
 Sleeping/ sedated, NAD, calm, VSS, BP soft, MAP WDL, SB on monitor. No changes.

## 2023-12-04 NOTE — ED Notes (Signed)
 Pt grumbling and talking to self. Upon entering room speech directed to staff. Loud, beligerent, derogatory, and threatening verbally and physically. Meds explained. Pt remains loud and vocal, but accepting of IM meds to bilateral thighs. Rest  encouraged. Lights dimmed. Comfort measures attempted. Door open. Bed alarm active.

## 2023-12-04 NOTE — ED Notes (Addendum)
 Calmer, arousable, NAD, minimally interactive, airway patent, resps e/u. To radiology via stretcher.

## 2023-12-04 NOTE — ED Notes (Signed)
 Mildly restless, intermittent bed alarm, bed alarm working.

## 2023-12-04 NOTE — ED Provider Notes (Signed)
 Care assumed from Dr. Jerral.  At time of transfer of care, patient awaiting results of medical workup to look for a medical etiology of the patient's agitation.  Anticipate reassessment after workup to determine disposition  8:42 AM Nursing reports patient is not participating in his care.  He is refusing imaging and will not provide a urine.  He is getting agitated and aggressive with staff.  I went to talk to the patient to tell him we needed to make sure he was okay but he started cursing loudly at me.  He was perseverating on the TV was talking to him.  Given his history schizophrenia and history of psychosis by report, will place him under IVC as he is off his meds and is too agitated to participate in his care.  Will give medicine this and get the workup and will consult TTS.  2:00 PM Patient had a urinalysis that does show leukocytes and bacteria.  Given the family report to the overnight team that the patient is acting similar to when he had altered mental status and agitation related to UTI we will treat with antibiotics and he will be admitted.  TTS will also see the patient.  Otherwise his workup did not show critical findings and his chest x-ray and CT head did not show significant abnormalities.  Will give antibiotics to treat for UTI and admit for altered mental status and agitation in the setting of being off of his psychiatric medications and urinary tract infection likely causing some encephalopathy.   Clinical Impression: 1. Psychosis, unspecified psychosis type (HCC)   2. Noncompliance with medication regimen     Disposition: Admit  This note was prepared with assistance of Dragon voice recognition software. Occasional wrong-word or sound-a-like substitutions may have occurred due to the inherent limitations of voice recognition software.     Dashonna Chagnon, Lonni PARAS, MD 12/04/23 [phone]

## 2023-12-04 NOTE — ED Notes (Addendum)
 This RN attempted to get labs from patient. Patient refused at this time. Dr. Jerral notified.

## 2023-12-04 NOTE — ED Notes (Signed)
 Pending CT. Pending efficacy of recent haldol  and improved behavior change. Calm and resting when left alone. Irritable and resistant with staff interaction. Warm blankets applied, room temp increased. CT assessed, will come back.

## 2023-12-04 NOTE — ED Provider Notes (Signed)
 NP attempted to reassess patient face to face, patient is difficult to arouse, he continues to be asleep. Will follow-up later today.

## 2023-12-05 ENCOUNTER — Other Ambulatory Visit: Payer: Self-pay

## 2023-12-05 DIAGNOSIS — N390 Urinary tract infection, site not specified: Secondary | ICD-10-CM | POA: Diagnosis not present

## 2023-12-05 DIAGNOSIS — R410 Disorientation, unspecified: Secondary | ICD-10-CM | POA: Diagnosis not present

## 2023-12-05 DIAGNOSIS — Z8659 Personal history of other mental and behavioral disorders: Secondary | ICD-10-CM | POA: Diagnosis not present

## 2023-12-05 LAB — CBC
HCT: 41.3 % (ref 39.0–52.0)
Hemoglobin: 13 g/dL (ref 13.0–17.0)
MCH: 28.6 pg (ref 26.0–34.0)
MCHC: 31.5 g/dL (ref 30.0–36.0)
MCV: 91 fL (ref 80.0–100.0)
Platelets: 108 10*3/uL — ABNORMAL LOW (ref 150–400)
RBC: 4.54 MIL/uL (ref 4.22–5.81)
RDW: 14.4 % (ref 11.5–15.5)
WBC: 5.3 10*3/uL (ref 4.0–10.5)
nRBC: 0 % (ref 0.0–0.2)

## 2023-12-05 LAB — COMPREHENSIVE METABOLIC PANEL WITH GFR
ALT: 8 U/L (ref 0–44)
AST: 14 U/L — ABNORMAL LOW (ref 15–41)
Albumin: 3.1 g/dL — ABNORMAL LOW (ref 3.5–5.0)
Alkaline Phosphatase: 71 U/L (ref 38–126)
Anion gap: 6 (ref 5–15)
BUN: 11 mg/dL (ref 8–23)
CO2: 20 mmol/L — ABNORMAL LOW (ref 22–32)
Calcium: 9 mg/dL (ref 8.9–10.3)
Chloride: 116 mmol/L — ABNORMAL HIGH (ref 98–111)
Creatinine, Ser: 0.96 mg/dL (ref 0.61–1.24)
GFR, Estimated: >60 mL/min (ref >60)
Glucose, Bld: 71 mg/dL (ref 70–99)
Potassium: 4.6 mmol/L (ref 3.5–5.1)
Sodium: 142 mmol/L (ref 135–145)
Total Bilirubin: 0.6 mg/dL (ref 0.0–1.2)
Total Protein: 6 g/dL — ABNORMAL LOW (ref 6.5–8.1)

## 2023-12-05 LAB — HIV ANTIBODY (ROUTINE TESTING W REFLEX): HIV Screen 4th Generation wRfx: NONREACTIVE

## 2023-12-05 MED ORDER — OLANZAPINE 5 MG PO TBDP
5.0000 mg | ORAL_TABLET | Freq: Every day | ORAL | Status: DC
  Filled 2023-12-05: qty 1

## 2023-12-05 MED ORDER — SODIUM CHLORIDE 0.9 % IV BOLUS
1000.0000 mL | Freq: Once | INTRAVENOUS | Status: AC
  Administered 2023-12-05: 1000 mL via INTRAVENOUS

## 2023-12-05 MED ORDER — PALIPERIDONE ER 1.5 MG PO TB24
1.5000 mg | ORAL_TABLET | Freq: Every day | ORAL | Status: DC
  Filled 2023-12-05: qty 1
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
## 2023-12-05 NOTE — Progress Notes (Signed)
 Pt rectal temperature is 95.93F. RN offered warm blankets but pt refused. Pt room temp set to 51F. Pt also refuse to take any medications tonight. NP Andrez made aware. Will continue to monitor pt.

## 2023-12-05 NOTE — Progress Notes (Signed)
 PROGRESS NOTE [name]  FMW:[id number] DOB: 08-13-1955 DOA: 12/04/2023 PCP: Vicci Barnie NOVAK, MD  Brief Narrative/Hospital Course: 49 yom w/ psychosis and schizophrenia, tobacco use disorder, adenocarcinoma of the colon, hypotension,hx of  covid/aspiration pneumonia/ bacteremia, history of metabolic encephalopathy in the setting of UTI sent in from [facility] due to agitation. Reportedly has been off of his schizophrenia medication and has been agitated at the facility.  He has had history of UTI that cause similar presentation in the past.  In the ED patient was not participating in the care, was agitated and aggressive.  He was placed on IVC, given sedatives Workup revealed abnormal UA consistent with UTI .  He was also bradycardic in 30s to 40s, labs fairly stable Chest x-ray CT head no acute finding besides emphysema chronic appearing changes in the x-ray  Subjective: Seen and examined Alert awake resting comfortably oriented to self current place currently presented Overnight heart rate in 30s to 40s, this morning at 71 BP 100/78, on room air, temperature in 95.7. He had refused lab works previously  Assessment and plan:  Schizophrenia with agitation and psychosis Acute metabolic encephalopathy-due to UTI and sedatives Patient has not been taking his psych meds and with UTI likely leading to aggression/psychosis and encephalopathy. Placed under IVC in the ED- as he was refusing care.  Psychiatry has been consulted.  Continue one-to-one. Continue prn Haldol  Ativan , delirium precaution supportive care   Acute UTI: Continue empiric antibiotics follow-up urine culture  Sinus bradycardia: ?  From sedatives.  Heart rate improving BP stable  Hypothermia Likely in the setting of UTI continue Baer hugger    Hypokalemia Resolved  Mild protein malnutrition  Augment diet.    Tobacco use disorder Nicotine  replacement therapy as needed.   DVT prophylaxis:  enoxaparin  (LOVENOX ) injection 40 mg Start: 12/04/23 2200 Code Status:   Code Status: Full Code Family Communication: plan of care discussed with patient at bedside. Patient status is: Remains hospitalized because of severity of illness Level of care: Stepdown   Dispo: The patient is from: [facility] mental nursing facility            Anticipated disposition: TBD Objective: Vitals last 24 hrs: Vitals:   12/05/23 0710 12/05/23 0741 12/05/23 0800 12/05/23 0900  BP: 100/78  126/86 107/69  Pulse: 71   (!) 59  Resp: (!) 23  11 14   Temp:  (!) 95.7 F (35.4 C)    TempSrc:  Axillary    SpO2: 100%   99%    Physical Examination: General exam: alert awake, older than stated age HEENT:Oral mucosa moist, Ear/Nose WNL grossly Respiratory system: Bilaterally clear BS, no use of accessory muscle Cardiovascular system: S1 & S2 +. Gastrointestinal system: Abdomen soft, NT,ND,BS+ Nervous System: Alert, awake, following commands. Extremities: LE edema neg, warm extremities Skin: No rashes,warm. MSK: Normal muscle bulk/tone.   Data Reviewed: I have personally reviewed following labs and imaging studies ( see epic result tab) CBC: Recent Labs  Lab 12/04/23 0649 12/05/23 0325  WBC 5.0 5.3  NEUTROABS 2.8  --   HGB 13.1 13.0  HCT 42.4 41.3  MCV 92.6 91.0  PLT 118* 108*   CMP: Recent Labs  Lab 12/04/23 0649 12/05/23 0325  NA 141 142  K 3.3* 4.6  CL 108 116*  CO2 22 20*  GLUCOSE 122* 71  BUN 12 11  CREATININE 1.03 0.96  CALCIUM 9.6 9.0   GFR: CrCl cannot be calculated (Unknown ideal weight.). Recent Labs  Lab 12/04/23 0649 12/05/23 0325  AST 13* 14*  ALT 9 8  ALKPHOS 73 71  BILITOT 0.5 0.6  PROT 6.4* 6.0*  ALBUMIN 3.3* 3.1*   No results for input(s): LIPASE, AMYLASE in the last 168 hours. No results for input(s): AMMONIA in the last 168 hours. Coagulation Profile: No results for input(s): INR, PROTIME in the last 168 hours. Unresulted Labs (From admission, onward)      Start     Ordered   12/05/23 0600  HIV Antibody (routine testing w rflx)  Once,   R        12/05/23 0600           Antimicrobials/Microbiology: Anti-infectives (From admission, onward)    Start     Dose/Rate Route Frequency Ordered Stop   12/05/23 0800  cefTRIAXone  (ROCEPHIN ) 2 g in sodium chloride  0.9 % 100 mL IVPB        2 g 200 mL/hr over 30 Minutes Intravenous Every 24 hours 12/04/23 1835     12/04/23 1415  cefTRIAXone  (ROCEPHIN ) 1 g in sodium chloride  0.9 % 100 mL IVPB        1 g 200 mL/hr over 30 Minutes Intravenous  Once 12/04/23 1400 12/04/23 1526         Component Value Date/Time   SDES URINE, RANDOM 02/26/2023 1852   SPECREQUEST NONE Reflexed from [id number] 02/26/2023 1852   CULT (A) 02/26/2023 1852    >=100,000 COLONIES/mL ESCHERICHIA COLI >=100,000 COLONIES/mL GROUP B STREP(S.AGALACTIAE)ISOLATED TESTING AGAINST S. AGALACTIAE NOT ROUTINELY PERFORMED DUE TO PREDICTABILITY OF AMP/PEN/VAN SUSCEPTIBILITY. Performed at [facility], [address], [place], [place] [address]    REPTSTATUS 03/01/2023 FINAL 02/26/2023 8147    Procedures:  Medications reviewed:  Scheduled Meds:  Chlorhexidine  Gluconate Cloth  6 each Topical Daily   enoxaparin  (LOVENOX ) injection  40 mg Subcutaneous Q24H   Continuous Infusions:  cefTRIAXone  (ROCEPHIN )  IV 2 g (12/05/23 1006)    Mennie LAMY, MD [facility] 12/05/2023, 10:28 AM

## 2023-12-05 NOTE — TOC Initial Note (Signed)
 Transition of Care [facility]) - Initial/Assessment Note    Patient Details  Name: Dustin [name] MRN: [id number] Date of Birth: 1956-05-30  Transition of Care [facility]) CM/SW Contact:    Jon ONEIDA Anon, RN Phone Number: 12/05/2023, 2:29 PM  Clinical Narrative:                 Pt is from home. Pt has a Legal Guardian [name] [phone] and is also listed as pt POC. Attempted to speak with Legal Guardian with no answer. Pt is oriented to self. Pt is under IVC and it was initiated on 7/1. 3 copies of IVC paperwork are on the shadow chart. Psych is consulted. There are no SDOH risks identified at this time. TOC will follow for any new recommendations or needs.     Expected Discharge Plan:  (TBD) Barriers to Discharge: Continued Medical Work up, Requiring sitter/restraints   Patient Goals and CMS Choice Patient states their goals for this hospitalization and ongoing recovery are:: Home CMS Medicare.gov Compare Post Acute Care list provided to:: Other (Comment Required) (NA) Choice offered to / list presented to : NA [place] ownership interest in [facility].provided to:: Parent NA    Expected Discharge Plan and Services In-house Referral: NA Discharge Planning Services: NA Post Acute Care Choice: NA Living arrangements for the past 2 months: Single Family Home                 DME Arranged: N/A DME Agency: NA       HH Arranged: NA HH Agency: NA        Prior Living Arrangements/Services Living arrangements for the past 2 months: Single Family Home Lives with:: Relatives Patient language and need for interpreter reviewed:: Yes Do you feel safe going back to the place where you live?: Yes      Need for Family Participation in Patient Care: No (Comment) Care giver support system in place?: No (comment) Current home services: Other (comment) (NA) Criminal Activity/Legal Involvement Pertinent to Current Situation/Hospitalization: No - Comment as  needed  Activities of Daily Living   ADL Screening (condition at time of admission) Independently performs ADLs?: No (UTA) Does the patient have a NEW difficulty with bathing/dressing/toileting/self-feeding that is expected to last >3 days?: Yes (Initiates electronic notice to provider for possible OT consult) Does the patient have a NEW difficulty with getting in/out of bed, walking, or climbing stairs that is expected to last >3 days?: Yes (Initiates electronic notice to provider for possible PT consult) Does the patient have a NEW difficulty with communication that is expected to last >3 days?: Yes (Initiates electronic notice to provider for possible SLP consult) Is the patient deaf or have difficulty hearing?: No Does the patient have difficulty seeing, even when wearing glasses/contacts?: No Does the patient have difficulty concentrating, remembering, or making decisions?: Yes  Permission Sought/Granted Permission sought to share information with : Family Supports Permission granted to share information with : Yes, Verbal Permission Granted  Share Information with NAME: [place],[name] (Brother)  [phone]           Emotional Assessment Appearance:: Appears stated age Attitude/Demeanor/Rapport: Unable to Assess Affect (typically observed): Unable to Assess Orientation: : Oriented to Self Alcohol / Substance Use: Not Applicable Psych Involvement: Yes (comment)  Admission diagnosis:  Noncompliance with medication regimen [Z91.148] Acute UTI (urinary tract infection) [N39.0] Psychosis, unspecified psychosis type [facility]) [F29] Patient Active Problem List   Diagnosis Date Noted   Acute UTI (urinary tract infection) 12/04/2023  Hypokalemia 12/04/2023   Mild protein malnutrition (HCC) 12/04/2023   Urinary tract infection without hematuria 02/27/2023   Metabolic encephalopathy 02/27/2023   Sinus bradycardia 02/26/2023   Hypothermia 02/26/2023   Altered mental status 02/26/2023    Aggressive behavior 02/26/2023   History of colon cancer, stage I 02/09/2023   Bacteremia 11/12/2022   Sepsis secondary to UTI (HCC) 11/10/2022   Pressure injury of skin 11/10/2022   Protein-calorie malnutrition, severe 07/22/2022   COVID-19 virus infection 07/20/2022   Hypotension 07/20/2022   Aspiration pneumonia (HCC) 07/20/2022   Adenocarcinoma of colon (HCC) 12/13/2021   Malignant neoplasm of ascending colon (HCC) 11/08/2021   Family history of cancer 11/08/2021   IDA (iron deficiency anemia)    Colonic mass 10/06/2021   Microcytic anemia 10/05/2021   Syncope 10/05/2021   Symptomatic anemia    Schizophrenia (HCC) 07/12/2018   Schizoaffective disorder, bipolar type (HCC) 01/24/2017   Tobacco use disorder 11/29/2016   PCP:  Vicci Barnie NOVAK, MD Pharmacy:   [facility] - [place], [place] - [address] [place] [address] Phone: [phone] Fax: [phone]     Social Drivers of Health (SDOH) Social History: SDOH Screenings   Food Insecurity: Patient Unable To Answer (12/04/2023)  Housing: Unknown (12/04/2023)  Transportation Needs: Patient Unable To Answer (12/04/2023)  Utilities: Patient Unable To Answer (12/04/2023)  Alcohol Screen: Low Risk  (03/06/2022)  Depression (PHQ2-9): Low Risk  (02/09/2023)  Social Connections: Patient Unable To Answer (12/04/2023)  Tobacco Use: High Risk (02/26/2023)   SDOH Interventions:     Readmission Risk Interventions    12/05/2023    2:21 PM 03/02/2023    1:24 PM  Readmission Risk Prevention Plan  Transportation Screening Complete Complete  PCP or Specialist Appt within 5-7 Days Complete   PCP or Specialist Appt within 3-5 Days  Complete  Home Care Screening Complete   Medication Review (RN CM) Complete   Palliative Care Screening  Not Applicable  Medication Review (RN Care Manager)  Complete

## 2023-12-05 NOTE — Progress Notes (Signed)
 PT refusing lab draws. Per lab tech patient told her he didn't want to give anymore blood because we are trying to make  too many babies with my blood. Will notify provider of refusal during morning rounds.

## 2023-12-05 NOTE — Hospital Course (Addendum)
 67 yom w/ psychosis and schizophrenia, tobacco use disorder, adenocarcinoma of the colon, hypotension,hx of  covid/aspiration pneumonia/ bacteremia, history of metabolic encephalopathy in the setting of UTI sent in from [facility] and rehab due to agitation. Reportedly has been off of his schizophrenia medication and has been agitated at the facility.  He has had history of UTI that cause similar presentation in the past.  In the ED patient was not participating in the care, was agitated and aggressive.  He was placed on IVC, given sedatives Workup revealed abnormal UA consistent with UTI .  He was also bradycardic in 30s to 40s, labs fairly stable Chest x-ray CT head no acute finding besides emphysema chronic appearing changes in the x-ray Psychiatry was consulted  7/4: Hemodynamically stable.Urine culture negative-DC Abx  Subjective: Patient was seen and examine today, no new complaints. No agitation today. Sitter at bedside.  Assessment and plan:  Schizophrenia with agitation and psychosis Acute metabolic encephalopathy-due to UTI and sedatives Patient has not been taking his psych meds and with UTI likely leading to aggression/psychosis. Mentation overall improved empirically treating antibiotics for UTI. Psychiatry is following continue IVC one-to-one as per psychiatry-resuming psych meds hopefully able to transition to injectable and can go back to facility once cleared by psychiatry-  likely Saturday. Restarting paliperidone , continue sertraline , Haldol  /Ativan  as needed Continue prn Haldol  Ativan , delirium precaution supportive care  Acute UTI: Urine culture negative-already received 4 days of ceftriaxone  so discontinuing further antibiotics  Sinus bradycardia: ?  From sedatives.  Monitor HR   Hypothermia Improved   Hypokalemia Resolved  Mild protein malnutrition  Augment diet.    Tobacco use disorder Nicotine  replacement therapy as needed.

## 2023-12-06 ENCOUNTER — Encounter (HOSPITAL_COMMUNITY): Payer: Self-pay | Admitting: Internal Medicine

## 2023-12-06 DIAGNOSIS — F203 Undifferentiated schizophrenia: Secondary | ICD-10-CM

## 2023-12-06 DIAGNOSIS — N390 Urinary tract infection, site not specified: Secondary | ICD-10-CM | POA: Diagnosis not present

## 2023-12-06 MED ORDER — OLANZAPINE 5 MG PO TABS: 5.0000 mg | ORAL_TABLET | Freq: Two times a day (BID) | ORAL | Status: DC | PRN

## 2023-12-06 MED ORDER — PALIPERIDONE ER 3 MG PO TB24
3.0000 mg | ORAL_TABLET | Freq: Every day | ORAL | Status: AC
  Filled 2023-12-06: qty 1

## 2023-12-06 MED ORDER — PALIPERIDONE ER 6 MG PO TB24: 9.0000 mg | ORAL_TABLET | Freq: Every day | ORAL | Status: DC

## 2023-12-06 MED ORDER — OLANZAPINE 10 MG IM SOLR: 10.0000 mg | Freq: Two times a day (BID) | INTRAMUSCULAR | Status: DC | PRN

## 2023-12-06 MED ORDER — PALIPERIDONE ER 6 MG PO TB24: 6.0000 mg | ORAL_TABLET | Freq: Every day | ORAL | Status: DC
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
## 2023-12-06 NOTE — Progress Notes (Signed)
 PT Cancellation Note  Patient Details Name: Dustin [name] MRN: [id number] DOB: Mar 08, 1956   Cancelled Treatment:    Reason Eval/Treat Not Completed: Patient declined, no reason specified  Pt refused to participate today.   He also reports being unable to walk.  Pt from [facility] facility however uncertain if resident or rehab.   Tari CROME [place] 12/06/2023, 11:53 AM Tari KLEIN, DPT Physical Therapist Acute Rehabilitation Services Office: [phone]

## 2023-12-06 NOTE — Evaluation (Signed)
 Occupational Therapy Evaluation Patient Details Name: Dustin [name] MRN: [id number] DOB: 10/24/1955 Today's Date: 12/06/2023   History of Present Illness   Dustin [name] is a 68 yr old male admitted to the hospital with agitation and behavioral changes. He was found to have hypothermia and a UTI. PMH: schizophrenia, bradycardia, tobacco use, colon CA, hypotension, aspiration PNA, UTI, R colectomy     Clinical Impressions The pt is currently presenting with the below listed deficits (see OT problem list). During the session, he required mod assist for supine to sit, total assist to don his socks seated EOB, and mod assist to stand from the EOB using a RW. Once in standing, he presented with B LE buckling, with him reporting feelings of generalized weakness. He was also noted to be with impaired sustained attention, decreased insight, disorientation to time and situation, and frequently disorganized and loosely associated thoughts. As such, often required redirection to tasks. OT will follow him for further services in the acute care setting, to maximize his safety and independence with self-care tasks. OT recommends he return to the skilled nursing facility at discharge.      If plan is discharge home, recommend the following:   A lot of help with bathing/dressing/bathroom;A lot of help with walking and/or transfers;Direct supervision/assist for medications management;Direct supervision/assist for financial management     Functional Status Assessment   Patient has had a recent decline in their functional status and demonstrates the ability to make significant improvements in function in a reasonable and predictable amount of time.     Equipment Recommendations   Other (comment) (defer to next level of care)     Recommendations for Other Services         Precautions/Restrictions   Precautions Precautions: Fall Restrictions Weight Bearing Restrictions Per Provider Order:  No     Mobility Bed Mobility Overal bed mobility: Needs Assistance Bed Mobility: Sit to Supine, Supine to Sit     Supine to sit: Mod assist Sit to supine: Contact guard assist        Transfers Overall transfer level: Needs assistance Equipment used: Rolling walker (2 wheels) Transfers: Sit to/from Stand Sit to Stand: Mod assist, From elevated surface                  Balance       Sitting balance - Comments: static sitting-good. dynamic sitting-fair     Standing balance-Leahy Scale: Poor                             ADL either performed or assessed with clinical judgement   ADL Overall ADL's : Needs assistance/impaired Eating/Feeding: Set up;Bed level   Grooming: Minimal assistance;Bed level;Cueing for sequencing           Upper Body Dressing : Minimal assistance;Bed level   Lower Body Dressing: Total assistance Lower Body Dressing Details (indicate cue type and reason): pt required assist to don his socks seated EOB     Toileting- Clothing Manipulation and Hygiene: Maximal assistance Toileting - Clothing Manipulation Details (indicate cue type and reason): at bedside commode level, based on clinical judgement              Pertinent Vitals/Pain Pain Assessment Pain Assessment: No/denies pain     Extremity/Trunk Assessment Upper Extremity Assessment Upper Extremity Assessment: RUE deficits/detail;LUE deficits/detail;Right hand dominant RUE Deficits / Details: AROM WFL. Functional grip strength LUE Deficits / Details: AROM WFL. Functional grip  strength   Lower Extremity Assessment Lower Extremity Assessment: Generalized weakness       Communication     Cognition Arousal: Alert Behavior During Therapy: WFL for tasks assessed/performed Cognition: Cognition impaired   Orientation impairments: Time, Situation Awareness: Online awareness impaired Memory impairment (select all impairments): Declarative long-term  memory Attention impairment (select first level of impairment): Selective attention, Divided attention Executive functioning impairment (select all impairments): Reasoning, Organization OT - Cognition Comments: Oriented to person and place. Disoriented to city, month, year, and situation. Pt presented with loosely associated/disorganized thoughts. He required redirection to tasks. Impaired attention.                  Following commands: Intact       Cueing  General Comments     Exercises   Shoulder Instructions     Home Living Family/patient expects to be discharged to:: Skilled nursing facility              Additional Comments: The pt reported being a resident at [facility] nursing facility. He was unable to recall how long he has been at the facility, however he did state he's a long-term care resident there. Therapy notes from a year ago stated the pt lived alone in a mobile home.  Information reported by the pt should be verified, as he was a questionable historian at times.      Prior Functioning/Environment Prior Level of Function : Needs assist             Mobility Comments: The pt stated he has not ambulated in years. The pt stated the staff at the facility assist him with stand-pivot transfers into and out of a wheelchair. He could not state whether this was a power or manual wheelchair, however he stated his roommate broke his wheelchair. A PT note from approximately a year ago stated the pt was household ambulatory using a cane at that time. ADLs Comments: The pt reported being independent with feeding, and needing assistance for bathing, dressing, and toileting tasks at the facility. A OT note from approximately a year ago stated the pt was independent with ADLs, however it was difficult.    OT Problem List: Decreased strength;Impaired balance (sitting and/or standing);Decreased cognition;Decreased safety awareness;Decreased knowledge of use of DME or  AE   OT Treatment/Interventions: Self-care/ADL training;Therapeutic exercise;Therapeutic activities;Cognitive remediation/compensation;DME and/or AE instruction;Balance training;Energy conservation      OT Goals(Current goals can be found in the care plan section)   Acute Rehab OT Goals OT Goal Formulation: Patient unable to participate in goal setting Time For Goal Achievement: 12/20/23 Potential to Achieve Goals: Good ADL Goals Pt Will Perform Grooming: with set-up;sitting;with supervision Pt Will Perform Upper Body Dressing: with set-up;with supervision;sitting Pt Will Transfer to Toilet: with contact guard assist;bedside commode;stand pivot transfer Additional ADL Goal #1: The pt will perform bed mobility with CGA, in prep for progressive ADL participation.   OT Frequency:  Min 2X/week       AM-PAC OT 6 Clicks Daily Activity     Outcome Measure Help from another person eating meals?: A Little Help from another person taking care of personal grooming?: A Little Help from another person toileting, which includes using toliet, bedpan, or urinal?: A Lot Help from another person bathing (including washing, rinsing, drying)?: A Lot Help from another person to put on and taking off regular upper body clothing?: A Little Help from another person to put on and taking off regular lower body clothing?: Total 6  Click Score: 14   End of Session Equipment Utilized During Treatment: Gait belt;Rolling walker (2 wheels) Nurse Communication: Mobility status  Activity Tolerance: Patient tolerated treatment well Patient left: in bed;with call bell/phone within reach;with bed alarm set  OT Visit Diagnosis: Unsteadiness on feet (R26.81);Other abnormalities of gait and mobility (R26.89);Muscle weakness (generalized) (M62.81);Other symptoms and signs involving cognitive function                Time: 1340-1411 OT Time Calculation (min): 31 min Charges:  OT General Charges $OT Visit: 1  Visit OT Evaluation $OT Eval Moderate Complexity: 1 Mod OT Treatments $Therapeutic Activity: 8-22 mins    Delanna LITTIE Molt, OTR/L 12/06/2023, 5:20 PM

## 2023-12-06 NOTE — Progress Notes (Signed)
 PROGRESS NOTE Dustin [name]  FMW:[id number] DOB: 26-Aug-1955 DOA: 12/04/2023 PCP: Vicci Barnie NOVAK, MD  Brief Narrative/Hospital Course: 34 yom w/ psychosis and schizophrenia, tobacco use disorder, adenocarcinoma of the colon, hypotension,hx of  covid/aspiration pneumonia/ bacteremia, history of metabolic encephalopathy in the setting of UTI sent in from [facility] due to agitation. Reportedly has been off of his schizophrenia medication and has been agitated at the facility.  He has had history of UTI that cause similar presentation in the past.  In the ED patient was not participating in the care, was agitated and aggressive.  He was placed on IVC, given sedatives Workup revealed abnormal UA consistent with UTI .  He was also bradycardic in 30s to 40s, labs fairly stable Chest x-ray CT head no acute finding besides emphysema chronic appearing changes in the x-ray Psychiatry was consulted  Subjective: Seen and examined Alert awake communicates and answers questions, one-to-one sitter at the place Overnight temperature still on lower side 97.5  Assessment and plan:  Schizophrenia with agitation and psychosis Acute metabolic encephalopathy-due to UTI and sedatives Patient has not been taking his psych meds and with UTI likely leading to aggression/psychosis. Mentation overall improved empirically treating antibiotics for UTI. Psychiatry is following continue IVC one-to-one as per psychiatry-resuming psych meds hopefully able to transition to injectable and can go back to facility once cleared by psychiatry-  likely Saturday. Restarting paliperidone , continue sertraline , Haldol  /Ativan  as needed Continue prn Haldol  Ativan , delirium precaution supportive care  Acute UTI: Continue empiric antibiotics. Urine culture not sent, will add on if sample available.  Sinus bradycardia: ?  From sedatives.  Monitor HR   Hypothermia Likely in the setting of UTI continue [employer] as able    Hypokalemia Resolved  Mild protein malnutrition  Augment diet.    Tobacco use disorder Nicotine  replacement therapy as needed.   DVT prophylaxis: enoxaparin  (LOVENOX ) injection 40 mg Start: 12/04/23 2200 Code Status:   Code Status: Full Code Family Communication: plan of care discussed with patient at bedside. Patient status is: Remains hospitalized because of severity of illness Level of care: Telemetry   Dispo: The patient is from: [facility] mental nursing facility            Anticipated disposition: TBD Objective: Vitals last 24 hrs: Vitals:   12/05/23 2146 12/06/23 0056 12/06/23 0434 12/06/23 0500  BP:  (!) 96/56 114/69   Pulse:  (!) 42 (!) 49   Resp:  13 15   Temp: (!) 95.1 F (35.1 C) (!) 97.3 F (36.3 C) (!) 97.5 F (36.4 C)   TempSrc: Rectal Oral Oral   SpO2:  100% 100%   Weight:      Height:    5' 7 (1.702 m)    Physical Examination: General exam: alert awake, follows commands interactive somewhat, difficult to comprehend, not agitated HEENT:Oral mucosa moist, Ear/Nose WNL grossly Respiratory system: Bilaterally clear BS,no use of accessory muscle Cardiovascular system: S1 & S2 +, No JVD. Gastrointestinal system: Abdomen soft,NT,ND, BS+ Nervous System: Alert, awake, moving all extremities,and following commands. Extremities: LE edema neg,distal peripheral pulses palpable and warm.  Skin: No rashes,no icterus. MSK: Normal muscle bulk,tone, power    Data Reviewed: I have personally reviewed following labs and imaging studies ( see epic result tab) CBC: Recent Labs  Lab 12/04/23 0649 12/05/23 0325  WBC 5.0 5.3  NEUTROABS 2.8  --   HGB 13.1 13.0  HCT 42.4 41.3  MCV 92.6 91.0  PLT 118* 108*  CMP: Recent Labs  Lab 12/04/23 0649 12/05/23 0325  NA 141 142  K 3.3* 4.6  CL 108 116*  CO2 22 20*  GLUCOSE 122* 71  BUN 12 11  CREATININE 1.03 0.96  CALCIUM 9.6 9.0   GFR: Estimated Creatinine Clearance: 69.8 mL/min (by C-G formula based  on SCr of 0.96 mg/dL). Recent Labs  Lab 12/04/23 0649 12/05/23 0325  AST 13* 14*  ALT 9 8  ALKPHOS 73 71  BILITOT 0.5 0.6  PROT 6.4* 6.0*  ALBUMIN 3.3* 3.1*   No results for input(s): LIPASE, AMYLASE in the last 168 hours. No results for input(s): AMMONIA in the last 168 hours. Coagulation Profile: No results for input(s): INR, PROTIME in the last 168 hours. Unresulted Labs (From admission, onward)     Start     Ordered   12/06/23 0917  Urine Culture (for pregnant, neutropenic or urologic patients or patients with an indwelling urinary catheter)  (Urine Labs)  Add-on,   AD       Question:  Indication  Answer:  Dysuria   12/06/23 0916           Antimicrobials/Microbiology: Anti-infectives (From admission, onward)    Start     Dose/Rate Route Frequency Ordered Stop   12/05/23 0800  cefTRIAXone  (ROCEPHIN ) 2 g in sodium chloride  0.9 % 100 mL IVPB        2 g 200 mL/hr over 30 Minutes Intravenous Every 24 hours 12/04/23 1835     12/04/23 1415  cefTRIAXone  (ROCEPHIN ) 1 g in sodium chloride  0.9 % 100 mL IVPB        1 g 200 mL/hr over 30 Minutes Intravenous  Once 12/04/23 1400 12/04/23 1526         Component Value Date/Time   SDES URINE, RANDOM 02/26/2023 1852   SPECREQUEST NONE Reflexed from [id number] 02/26/2023 1852   CULT (A) 02/26/2023 1852    >=100,000 COLONIES/mL ESCHERICHIA COLI >=100,000 COLONIES/mL GROUP B STREP(S.AGALACTIAE)ISOLATED TESTING AGAINST S. AGALACTIAE NOT ROUTINELY PERFORMED DUE TO PREDICTABILITY OF AMP/PEN/VAN SUSCEPTIBILITY. Performed at [facility], [address] [place], [place] [address]    REPTSTATUS 03/01/2023 FINAL 02/26/2023 1852    Procedures:  Medications reviewed:  Scheduled Meds:  Chlorhexidine  Gluconate Cloth  6 each Topical Daily   enoxaparin  (LOVENOX ) injection  40 mg Subcutaneous Q24H   paliperidone   3 mg Oral Daily   [START ON 12/07/2023] paliperidone   6 mg Oral Daily   [START ON 12/08/2023] paliperidone   9 mg Oral  Daily   Continuous Infusions:  cefTRIAXone  (ROCEPHIN )  IV 2 g (12/06/23 0957)    Mennie LAMY, MD [facility] 12/06/2023, 11:10 AM

## 2023-12-06 NOTE — Progress Notes (Signed)
 Pt refused VS

## 2023-12-07 DIAGNOSIS — G9341 Metabolic encephalopathy: Secondary | ICD-10-CM

## 2023-12-07 DIAGNOSIS — Z8659 Personal history of other mental and behavioral disorders: Secondary | ICD-10-CM

## 2023-12-07 DIAGNOSIS — F201 Disorganized schizophrenia: Secondary | ICD-10-CM

## 2023-12-07 DIAGNOSIS — N390 Urinary tract infection, site not specified: Secondary | ICD-10-CM | POA: Diagnosis not present

## 2023-12-07 DIAGNOSIS — R4689 Other symptoms and signs involving appearance and behavior: Secondary | ICD-10-CM | POA: Diagnosis not present

## 2023-12-07 DIAGNOSIS — F22 Delusional disorders: Secondary | ICD-10-CM

## 2023-12-07 DIAGNOSIS — E876 Hypokalemia: Secondary | ICD-10-CM

## 2023-12-07 DIAGNOSIS — F17200 Nicotine dependence, unspecified, uncomplicated: Secondary | ICD-10-CM | POA: Diagnosis not present

## 2023-12-07 DIAGNOSIS — E441 Mild protein-calorie malnutrition: Secondary | ICD-10-CM

## 2023-12-07 LAB — URINE CULTURE: Culture: NO GROWTH

## 2023-12-07 MED ORDER — OLANZAPINE 5 MG PO TABS
5.0000 mg | ORAL_TABLET | Freq: Two times a day (BID) | ORAL | Status: DC | PRN
  Administered 2023-12-07: 5 mg via ORAL
  Filled 2023-12-07: qty 1

## 2023-12-07 MED ORDER — PALIPERIDONE ER 6 MG PO TB24
6.0000 mg | ORAL_TABLET | Freq: Every day | ORAL | Status: DC
  Administered 2023-12-07: 6 mg via ORAL
  Filled 2023-12-07 (×3): qty 1

## 2023-12-07 MED ORDER — OLANZAPINE 5 MG PO TABS: 5.0000 mg | ORAL_TABLET | Freq: Every day | ORAL | Status: DC

## 2023-12-07 MED ORDER — OLANZAPINE 10 MG IM SOLR: 5.0000 mg | Freq: Two times a day (BID) | INTRAMUSCULAR | Status: DC | PRN

## 2023-12-07 MED ORDER — LACTATED RINGERS IV BOLUS
500.0000 mL | Freq: Once | INTRAVENOUS | Status: AC
  Administered 2023-12-07: 500 mL via INTRAVENOUS
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
## 2023-12-07 NOTE — Evaluation (Signed)
 Physical Therapy Evaluation Patient Details Name: [facility]in [name] MRN: [id number] DOB: [birth year] Today's Date: 12/07/2023  History of Present Illness  Pt is a 68 year old male presenting to the hospital from [facility] with agitation and behavioral changes and admitted for Schizophrenia with agitation and psychosis and acute metabolic encephalopathy-due to UTI and sedatives on 12/04/23.  PMH: schizophrenia, bradycardia, tobacco use, colon CA, hypotension, aspiration PNA, UTI, R colectomy  Clinical Impression  Pt admitted with above diagnosis.  Pt currently with functional limitations due to the deficits listed below (see PT Problem List). Pt will benefit from acute skilled PT to increase their independence and safety with mobility to allow discharge.  Pt agreeable for OOB activity today.  Pt report he has not ambulated in a long time, typically transfer to w/c at facility, however then reports he does walk a little with RW. Pt assisted with ambulating very short distance to/from window seat to have bed linen changed (saturated with urine).  Pt has been variable with regards to allowing care so would defer rehab potential to facility (pt could benefit but uncertain he would participate).  Will attempt to assist with mobilizing pt in acute care as pt allows.         If plan is discharge home, recommend the following: A lot of help with bathing/dressing/bathroom;A lot of help with walking and/or transfers;Assistance with cooking/housework;Direct supervision/assist for medications management;Supervision due to cognitive status   Can travel by private vehicle        Equipment Recommendations None recommended by PT  Recommendations for Other Services       Functional Status Assessment Patient has had a recent decline in their functional status and demonstrates the ability to make significant improvements in function in a reasonable and predictable amount of time.     Precautions / Restrictions  Precautions Precautions: Fall      Mobility  Bed Mobility Overal bed mobility: Needs Assistance Bed Mobility: Supine to Sit     Supine to sit: Mod assist, HOB elevated     General bed mobility comments: cues for self assist, pt requesting elevated HOB; assist to scoot    Transfers Overall transfer level: Needs assistance Equipment used: Rolling walker (2 wheels) Transfers: Sit to/from Stand Sit to Stand: Mod assist, From elevated surface           General transfer comment: verbal cues for hand placement; assist to rise and stabilize; does not perform fully erect posture despite cues    Ambulation/Gait Ambulation/Gait assistance: Min assist, +2 physical assistance Gait Distance (Feet): 4 Feet Assistive device: Rolling walker (2 wheels) Gait Pattern/deviations: Shuffle, Decreased stride length, Step-through pattern Gait velocity: decr     General Gait Details: verbal cues for use of RW; shuffling observed; flexion posture throughout despite cues for posture, ambulated bed to window seat and then back to bed (to change urine soaked bed)  Stairs            Wheelchair Mobility     Tilt Bed    Modified Rankin (Stroke Patients Only)       Balance Overall balance assessment: Needs assistance         Standing balance support: Bilateral upper extremity supported, Reliant on assistive device for balance, During functional activity Standing balance-Leahy Scale: Poor                               Pertinent Vitals/Pain Pain Assessment Pain Assessment:  No/denies pain    Home Living Family/patient expects to be discharged to:: Skilled nursing facility                        Prior Function Prior Level of Function : Needs assist             Mobility Comments: initially reports being unable to ambulate however able to take a few steps during session in room and then agreeable to short distances; apparently more transfers with w/c at  facility       Extremity/Trunk Assessment        Lower Extremity Assessment Lower Extremity Assessment: Generalized weakness       Communication        Cognition Arousal: Alert Behavior During Therapy: WFL for tasks assessed/performed   PT - Cognitive impairments: Problem solving, Sequencing, Safety/Judgement                       PT - Cognition Comments: pysch history Following commands: Intact       Cueing       General Comments      Exercises     Assessment/Plan    PT Assessment Patient needs continued PT services  PT Problem List Decreased strength;Decreased activity tolerance;Decreased balance;Decreased mobility;Decreased knowledge of use of DME       PT Treatment Interventions DME instruction;Gait training;Balance training;Functional mobility training;Patient/family education;Therapeutic exercise;Therapeutic activities;Wheelchair mobility training    PT Goals (Current goals can be found in the Care Plan section)  Acute Rehab PT Goals PT Goal Formulation: With patient Time For Goal Achievement: 12/21/23 Potential to Achieve Goals: Fair    Frequency Min 2X/week     Co-evaluation               AM-PAC PT 6 Clicks Mobility  Outcome Measure Help needed turning from your back to your side while in a flat bed without using bedrails?: A Little Help needed moving from lying on your back to sitting on the side of a flat bed without using bedrails?: A Lot Help needed moving to and from a bed to a chair (including a wheelchair)?: A Lot Help needed standing up from a chair using your arms (e.g., wheelchair or bedside chair)?: A Lot Help needed to walk in hospital room?: A Lot Help needed climbing 3-5 steps with a railing? : Total 6 Click Score: 12    End of Session Equipment Utilized During Treatment: Gait belt Activity Tolerance: Patient tolerated treatment well Patient left: in bed;with call bell/phone within reach;with nursing/sitter  in room (sitting EOB with sitter for bathing prior to return to bed)   PT Visit Diagnosis: Difficulty in walking, not elsewhere classified (R26.2);Muscle weakness (generalized) (M62.81)    Time: 8940-8884 PT Time Calculation (min) (ACUTE ONLY): 16 min   Charges:   PT Evaluation $PT Eval Low Complexity: 1 Low   PT General Charges $$ ACUTE PT VISIT: 1 Visit       Tari PT, DPT Physical Therapist Acute Rehabilitation Services Office: [phone]   Kati L Payson 12/07/2023, 1:56 PM

## 2023-12-07 NOTE — Evaluation (Addendum)
 SLP Cancellation Note  Patient Details Name: Dustin [name] MRN: [id number] DOB: [birth year]   Cancelled treatment:       Reason Eval/Treat Not Completed: Other (comment) (Per psychiatry notes, pt has h/o schizophrenia, his overall cognitive functioning appears more preserved, and the disorganized thinking is occurring in the absence of an acute confusional state, despite UTI  and RN reports pt communicating with her well.  Will sign off.)     Madelin POUR, MS [facility] SLP Acute Rehab Services Office [phone]   [name] 12/07/2023, 2:53 PM

## 2023-12-07 NOTE — Progress Notes (Signed)
 PROGRESS NOTE Dustin [name]  FMW:[id number] DOB: 1955-07-27 DOA: 12/04/2023 PCP: Vicci Barnie NOVAK, MD  Brief Narrative/Hospital Course: 87 yom w/ psychosis and schizophrenia, tobacco use disorder, adenocarcinoma of the colon, hypotension,hx of  covid/aspiration pneumonia/ bacteremia, history of metabolic encephalopathy in the setting of UTI sent in from [facility] due to agitation. Reportedly has been off of his schizophrenia medication and has been agitated at the facility.  He has had history of UTI that cause similar presentation in the past.  In the ED patient was not participating in the care, was agitated and aggressive.  He was placed on IVC, given sedatives Workup revealed abnormal UA consistent with UTI .  He was also bradycardic in 30s to 40s, labs fairly stable Chest x-ray CT head no acute finding besides emphysema chronic appearing changes in the x-ray Psychiatry was consulted  7/4: Hemodynamically stable.Urine culture negative-DC Abx  Subjective: Patient was seen and examine today, no new complaints. No agitation today. Sitter at bedside.  Assessment and plan:  Schizophrenia with agitation and psychosis Acute metabolic encephalopathy-due to UTI and sedatives Patient has not been taking his psych meds and with UTI likely leading to aggression/psychosis. Mentation overall improved empirically treating antibiotics for UTI. Psychiatry is following continue IVC one-to-one as per psychiatry-resuming psych meds hopefully able to transition to injectable and can go back to facility once cleared by psychiatry-  likely Saturday. Restarting paliperidone , continue sertraline , Haldol  /Ativan  as needed Continue prn Haldol  Ativan , delirium precaution supportive care  Acute UTI: Urine culture negative-already received 4 days of ceftriaxone  so discontinuing further antibiotics  Sinus bradycardia: ?  From sedatives.  Monitor HR   Hypothermia Improved    Hypokalemia Resolved  Mild protein malnutrition  Augment diet.    Tobacco use disorder Nicotine  replacement therapy as needed.   DVT prophylaxis: enoxaparin  (LOVENOX ) injection 40 mg Start: 12/04/23 2200 Code Status:   Code Status: Full Code Family Communication:   Patient status is: Remains hospitalized because of severity of illness Level of care: Telemetry   Dispo: The patient is from: [facility] mental nursing facility            Anticipated disposition: TBD Objective: Vitals last 24 hrs: Vitals:   12/07/23 0514 12/07/23 0800 12/07/23 1240 12/07/23 1400  BP: 112/74 (!) 129/98 (!) 82/57 90/62  Pulse: (!) 51 (!) 50    Resp: 16     Temp: 98.3 F (36.8 C)  97.9 F (36.6 C)   TempSrc: Oral  Oral   SpO2: 100% 100% 100%   Weight:      Height:        Physical Examination: General.  Frail gentleman, in no acute distress. Pulmonary.  Lungs clear bilaterally, normal respiratory effort. CV.  Regular rate and rhythm, no JVD, rub or murmur. Abdomen.  Soft, nontender, nondistended, BS positive. CNS.  Alert and oriented .  No focal neurologic deficit. Extremities.  No edema, no cyanosis, pulses intact and symmetrical.  Data Reviewed: I have personally reviewed following labs and imaging studies ( see epic result tab) CBC: Recent Labs  Lab 12/04/23 0649 12/05/23 0325  WBC 5.0 5.3  NEUTROABS 2.8  --   HGB 13.1 13.0  HCT 42.4 41.3  MCV 92.6 91.0  PLT 118* 108*   CMP: Recent Labs  Lab 12/04/23 0649 12/05/23 0325  NA 141 142  K 3.3* 4.6  CL 108 116*  CO2 22 20*  GLUCOSE 122* 71  BUN 12 11  CREATININE 1.03 0.96  CALCIUM  9.6 9.0   GFR: Estimated Creatinine Clearance: 69.8 mL/min (by C-G formula based on SCr of 0.96 mg/dL). Recent Labs  Lab 12/04/23 0649 12/05/23 0325  AST 13* 14*  ALT 9 8  ALKPHOS 73 71  BILITOT 0.5 0.6  PROT 6.4* 6.0*  ALBUMIN 3.3* 3.1*   No results for input(s): LIPASE, AMYLASE in the last 168 hours. No results for input(s): AMMONIA in  the last 168 hours. Coagulation Profile: No results for input(s): INR, PROTIME in the last 168 hours. Unresulted Labs (From admission, onward)    None      Antimicrobials/Microbiology: Anti-infectives (From admission, onward)    Start     Dose/Rate Route Frequency Ordered Stop   12/05/23 0800  cefTRIAXone  (ROCEPHIN ) 2 g in sodium chloride  0.9 % 100 mL IVPB  Status:  Discontinued        2 g 200 mL/hr over 30 Minutes Intravenous Every 24 hours 12/04/23 1835 12/07/23 1202   12/04/23 1415  cefTRIAXone  (ROCEPHIN ) 1 g in sodium chloride  0.9 % 100 mL IVPB        1 g 200 mL/hr over 30 Minutes Intravenous  Once 12/04/23 1400 12/04/23 1526         Component Value Date/Time   SDES  12/06/2023 1232    URINE, CLEAN CATCH Performed at [facility], [address], [place], [place] [address]    SPECREQUEST  12/06/2023 1232    NONE Performed at [facility], [address] [place], [place] [address]    CULT  12/06/2023 1232    NO GROWTH Performed at [facility], [address], [place], [place] [address]    REPTSTATUS 12/07/2023 FINAL 12/06/2023 1232    Procedures:  Medications reviewed:  Scheduled Meds:  Chlorhexidine  Gluconate Cloth  6 each Topical Daily   enoxaparin  (LOVENOX ) injection  40 mg Subcutaneous Q24H   paliperidone   6 mg Oral Daily   Continuous Infusions:  lactated ringers       Amaryllis Dare, MD [facility] 12/07/2023, 2:49 PM

## 2023-12-08 DIAGNOSIS — N390 Urinary tract infection, site not specified: Secondary | ICD-10-CM | POA: Diagnosis not present

## 2023-12-08 DIAGNOSIS — F17200 Nicotine dependence, unspecified, uncomplicated: Secondary | ICD-10-CM | POA: Diagnosis not present

## 2023-12-08 DIAGNOSIS — E876 Hypokalemia: Secondary | ICD-10-CM | POA: Diagnosis not present

## 2023-12-08 DIAGNOSIS — R4689 Other symptoms and signs involving appearance and behavior: Secondary | ICD-10-CM | POA: Diagnosis not present

## 2023-12-08 DIAGNOSIS — R001 Bradycardia, unspecified: Secondary | ICD-10-CM

## 2023-12-08 MED ORDER — PALIPERIDONE ER 6 MG PO TB24: 6.0000 mg | ORAL_TABLET | Freq: Every day | ORAL | Status: DC

## 2023-12-08 NOTE — Plan of Care (Signed)
   Problem: Coping: Goal: Level of anxiety will decrease Outcome: Progressing   Problem: Safety: Goal: Ability to remain free from injury will improve Outcome: Progressing   Problem: Skin Integrity: Goal: Risk for impaired skin integrity will decrease Outcome: Progressing

## 2023-12-08 NOTE — Discharge Summary (Signed)
 Physician Discharge Summary   Patient: Dustin [name] MRN: [id number] DOB: 1955/09/09  Admit date:     12/04/2023  Discharge date: 12/08/23  Discharge Physician: Amaryllis Dare   PCP: Vicci Barnie NOVAK, MD   Recommendations at discharge:  Please obtain CBC and CMP on follow-up Follow-up with primary care provider Follow-up with psychiatry  Discharge Diagnoses: Principal Problem:   Acute UTI (urinary tract infection) Active Problems:   Tobacco use disorder   Schizophrenia (HCC)   Sinus bradycardia   Hypothermia   Aggressive behavior   Metabolic encephalopathy   Hypokalemia   Mild protein malnutrition [facility])   Hospital Course: 68 yom w/ psychosis and schizophrenia, tobacco use disorder, adenocarcinoma of the colon, hypotension,hx of  covid/aspiration pneumonia/ bacteremia, history of metabolic encephalopathy in the setting of UTI sent in from [facility] due to agitation. Reportedly has been off of his schizophrenia medication and has been agitated at the facility.  He has had history of UTI that cause similar presentation in the past.  In the ED patient was not participating in the care, was agitated and aggressive.  He was placed on IVC, given sedatives Workup revealed abnormal UA consistent with UTI .  He was also bradycardic in 30s to 40s, labs fairly stable Chest x-ray CT head no acute finding besides emphysema chronic appearing changes in the x-ray Psychiatry was consulted  7/4: Hemodynamically stable.Urine culture negative-DC Abx  7/4: Remained hemodynamically stable.  Appears calm.  According to psych recommendations consider paliperidone  6 mg daily if accepted.  Patient is on Invega  IM monthly injections.  He will continue olanzapine , please avoid combining olanzapine  with benzodiazepine.  He will continue his home sertraline .  Patient will continue the rest of his home medications and follow-up with his providers which should include outpatient  psychiatrist for further assistance.  Subjective: Patient was seen and examine today, no new complaints. No agitation today. Sitter at bedside.  Assessment and plan:  Schizophrenia with agitation and psychosis Acute metabolic encephalopathy-due to UTI and sedatives Patient has not been taking his psych meds and with UTI likely leading to aggression/psychosis. Mentation overall improved empirically treating antibiotics for UTI. Restarting paliperidone , continue sertraline , Haldol  /Ativan  as needed Continue prn Haldol  Ativan , delirium precaution supportive care  Acute UTI: Urine culture negative-already received 4 days of ceftriaxone  so discontinuing further antibiotics  Sinus bradycardia: ?  From sedatives.  Monitor HR   Hypothermia Improved   Hypokalemia Resolved  Mild protein malnutrition  Augment diet.    Tobacco use disorder Nicotine  replacement therapy as needed.  Consultants: Psychiatry Procedures performed: None Disposition: Skilled nursing facility Diet recommendation:  Discharge Diet Orders (From admission, onward)     Start     Ordered   12/08/23 0000  Diet - low sodium heart healthy        12/08/23 0954           Regular diet DISCHARGE MEDICATION: Allergies as of 12/08/2023   No Known Allergies      Medication List     TAKE these medications    Invega  Sustenna 234 MG/1.5ML injection Generic drug: paliperidone  Inject 234 mg into the muscle every 28 (twenty-eight) days.   midodrine  5 MG tablet Commonly known as: PROAMATINE  Take 5 mg by mouth 3 (three) times daily.   Nutritional Supplement Liqd Take 120 mLs by mouth every evening.   OLANZapine  zydis 5 MG disintegrating tablet Commonly known as: ZYPREXA  Take 5 mg by mouth See admin instructions. Dissolve 5 mg in  the mouth at 8 AM and 8 PM   paliperidone  6 MG 24 hr tablet Commonly known as: INVEGA  Take 1 tablet (6 mg total) by mouth daily. Start taking on: December 09, 2023         Discharge Exam: [name]   12/05/23 1633  Weight: 68 kg   General.  Frail gentleman, in no acute distress. Pulmonary.  Lungs clear bilaterally, normal respiratory effort. CV.  Regular rate and rhythm, no JVD, rub or murmur. Abdomen.  Soft, nontender, nondistended, BS positive. CNS.  Alert and oriented .  No focal neurologic deficit. Extremities.  No edema, no cyanosis, pulses intact and symmetrical.  Condition at discharge: stable  The results of significant diagnostics from this hospitalization (including imaging, microbiology, ancillary and laboratory) are listed below for reference.   Imaging Studies: DG Chest Portable 1 View Result Date: 12/04/2023 CLINICAL DATA:  Altered mental status. EXAM: PORTABLE CHEST 1 VIEW COMPARISON:  02/26/2023. FINDINGS: Patient is again rotated to the left, which limits the sensitivity and specificity of this exam. Cardiomediastinal silhouette is otherwise unchanged. Redemonstrated emphysema and chronic appearing reticular changes in the left lung, may reflect interstitial lung disease. No appreciable focal consolidation, sizeable pleural effusion, or pneumothorax. No acute osseous abnormality. IMPRESSION: 1. Redemonstrated emphysema and chronic appearing reticular changes in the left lung, may reflect interstitial lung disease. 2. Otherwise, no acute cardiopulmonary findings. Electronically Signed   By: Harrietta Sherry M.D.   On: 12/04/2023 10:32   CT HEAD WO CONTRAST ( ) Result Date: 12/04/2023 CLINICAL DATA:  Head trauma, minor (Age >= 65y) Mental status change, unknown cause EXAM: CT HEAD WITHOUT CONTRAST TECHNIQUE: Contiguous axial images were obtained from the base of the skull through the vertex without intravenous contrast. RADIATION DOSE REDUCTION: This exam was performed according to the departmental dose-optimization program which includes automated exposure control, adjustment of the mA and/or kV according to patient size and/or use of  iterative reconstruction technique. COMPARISON:  CT of the head dated July 20, 2022. FINDINGS: Brain: No evidence of acute intracranial injury. The brain appears normal. There is no evidence of hemorrhage, mass, cortical infarct or hydrocephalus. Vascular: Moderate calcific atheromatous disease within the carotid siphons. Skull: Intact and unremarkable. Sinuses/Orbits: No acute finding. Other: None. IMPRESSION: Negative. Electronically Signed   By: Evalene Coho M.D.   On: 12/04/2023 10:15    Microbiology: Results for orders placed or performed during the hospital encounter of 12/04/23  MRSA Next Gen by PCR, Nasal     Status: None   Collection Time: 12/04/23  7:03 PM   Specimen: Nasal Mucosa; Nasal Swab  Result Value Ref Range Status   MRSA by PCR Next Gen NOT DETECTED NOT DETECTED Final    Comment: (NOTE) The GeneXpert MRSA Assay (FDA approved for NASAL specimens only), is one component of a comprehensive MRSA colonization surveillance program. It is not intended to diagnose MRSA infection nor to guide or monitor treatment for MRSA infections. Test performance is not FDA approved in patients less than 66 years old. Performed at [facility], [address], [place], [place] [address]   Urine Culture (for pregnant, neutropenic or urologic patients or patients with an indwelling urinary catheter)     Status: None   Collection Time: 12/06/23 12:32 PM   Specimen: Urine, Clean Catch  Result Value Ref Range Status   Specimen Description   Final    URINE, CLEAN CATCH Performed at [facility], [address], [place], [place] [address]  Special Requests   Final    NONE Performed at [facility], [address] [place], [place] [address]    Culture   Final    NO GROWTH Performed at [facility], [address] [place], [place] [address]    Report Status 12/07/2023 FINAL  Final    Labs: CBC: Recent Labs  Lab  12/04/23 0649 12/05/23 0325  WBC 5.0 5.3  NEUTROABS 2.8  --   HGB 13.1 13.0  HCT 42.4 41.3  MCV 92.6 91.0  PLT 118* 108*   Basic Metabolic Panel: Recent Labs  Lab 12/04/23 0649 12/05/23 0325  NA 141 142  K 3.3* 4.6  CL 108 116*  CO2 22 20*  GLUCOSE 122* 71  BUN 12 11  CREATININE 1.03 0.96  CALCIUM 9.6 9.0   Liver Function Tests: Recent Labs  Lab 12/04/23 0649 12/05/23 0325  AST 13* 14*  ALT 9 8  ALKPHOS 73 71  BILITOT 0.5 0.6  PROT 6.4* 6.0*  ALBUMIN 3.3* 3.1*   CBG: Recent Labs  Lab 12/04/23 1011  GLUCAP 91    Discharge time spent: greater than 30 minutes.  This record has been created using Conservation officer, historic buildings. Errors have been sought and corrected,but may not always be located. Such creation errors do not reflect on the standard of care.   Signed: Amaryllis Dare, MD [facility] 12/08/2023

## 2023-12-08 NOTE — TOC Transition Note (Signed)
 Transition of Care [facility]) - Discharge Note   Patient Details  Name: Dustin [name] MRN: [id number] Date of Birth: [birth year]  Transition of Care [facility]) CM/SW Contact:  Tawni CHRISTELLA Eva, LCSW Phone Number: 12/08/2023, 10:33 AM   Clinical Narrative:     Pt's IVC has been rescinded documents uploaded in to [facility] courts system. Pt will need to be sitter free for 24 hrs to return to facility. CSW spoke with pt's LG [name] Map to provide an update TOC to follow.   Final next level of care: Long Term Nursing Home Barriers to Discharge: Barriers Resolved   Patient Goals and CMS Choice Patient states their goals for this hospitalization and ongoing recovery are:: retrun to LTC facility CMS Medicare.gov Compare Post Acute Care list provided to:: Other (Comment Required) (NA) Choice offered to / list presented to : NA [place] ownership interest in [facility].provided to:: Parent NA    Discharge Placement                Patient to be transferred to facility by: EMS Name of family member notified: Gaubert,[name] (Brother)  [phone] (Mobile) Patient and family notified of of transfer: 12/08/23  Discharge Plan and Services Additional resources added to the After Visit Summary for   In-house Referral: NA Discharge Planning Services: NA Post Acute Care Choice: NA          DME Arranged: N/A DME Agency: NA       HH Arranged: NA HH Agency: NA        Social Drivers of Health (SDOH) Interventions SDOH Screenings   Food Insecurity: Patient Unable To Answer (12/04/2023)  Housing: Unknown (12/04/2023)  Transportation Needs: Patient Unable To Answer (12/04/2023)  Utilities: Patient Unable To Answer (12/04/2023)  Alcohol Screen: Low Risk  (03/06/2022)  Depression (PHQ2-9): Low Risk  (02/09/2023)  Social Connections: Patient Unable To Answer (12/04/2023)  Tobacco Use: High Risk (12/06/2023)     Readmission Risk Interventions    12/05/2023    2:21 PM 03/02/2023    1:24 PM   Readmission Risk Prevention Plan  Transportation Screening Complete Complete  PCP or Specialist Appt within 5-7 Days Complete   PCP or Specialist Appt within 3-5 Days  Complete  Home Care Screening Complete   Medication Review (RN CM) Complete   Palliative Care Screening  Not Applicable  Medication Review (RN Care Manager)  Complete

## 2023-12-09 DIAGNOSIS — F29 Unspecified psychosis not due to a substance or known physiological condition: Secondary | ICD-10-CM

## 2023-12-09 NOTE — Progress Notes (Signed)
 Patient stable and transporting to [place] via [facility] at this time.

## 2023-12-09 NOTE — TOC Transition Note (Signed)
 Transition of Care [facility]) - Discharge Note   Patient Details  Name: Dustin [name] MRN: [id number] Date of Birth: 1956/05/25  Transition of Care [facility]) CM/SW Contact:  Tawni CHRISTELLA Eva, LCSW Phone Number: 12/09/2023, 10:55 AM   Clinical Narrative:     Pt to d/c to [facility] today pt's room 607B RN to call report to [phone] ex [phone]. CSW attempted to speak with  pt's LG [name] Sandler no answer Left VM informing him about transfer. [facility] called . TOC sign off.   Final next level of care: Long Term Nursing Home Barriers to Discharge: Barriers Resolved   Patient Goals and CMS Choice Patient states their goals for this hospitalization and ongoing recovery are:: retrun to LTC facility CMS Medicare.gov Compare Post Acute Care list provided to:: Other (Comment Required) (NA) Choice offered to / list presented to : NA [place] ownership interest in [facility].provided to:: Parent NA    Discharge Placement                Patient to be transferred to facility by: EMS Name of family member notified: Shambaugh,[name] (Brother)  [phone] (Mobile) Patient and family notified of of transfer: 12/08/23  Discharge Plan and Services Additional resources added to the After Visit Summary for   In-house Referral: NA Discharge Planning Services: NA Post Acute Care Choice: NA          DME Arranged: N/A DME Agency: NA       HH Arranged: NA HH Agency: NA        Social Drivers of Health (SDOH) Interventions SDOH Screenings   Food Insecurity: Patient Unable To Answer (12/04/2023)  Housing: Unknown (12/04/2023)  Transportation Needs: Patient Unable To Answer (12/04/2023)  Utilities: Patient Unable To Answer (12/04/2023)  Alcohol Screen: Low Risk  (03/06/2022)  Depression (PHQ2-9): Low Risk  (02/09/2023)  Social Connections: Patient Unable To Answer (12/04/2023)  Tobacco Use: High Risk (12/06/2023)     Readmission Risk Interventions    12/05/2023    2:21 PM 03/02/2023     1:24 PM  Readmission Risk Prevention Plan  Transportation Screening Complete Complete  PCP or Specialist Appt within 5-7 Days Complete   PCP or Specialist Appt within 3-5 Days  Complete  Home Care Screening Complete   Medication Review (RN CM) Complete   Palliative Care Screening  Not Applicable  Medication Review (RN Care Manager)  Complete

## 2023-12-09 NOTE — Progress Notes (Signed)
 Attempted to call [facility] for report, advised that nurse will call back. Left call back number.

## 2023-12-09 NOTE — Progress Notes (Signed)
 Report called and given to [facility].

## 2023-12-09 NOTE — Plan of Care (Signed)
 Patient was discharged yesterday but unable to leave for SNF as he was still having sitter which was discontinued.  Remained stable and will be leaving today.

## 2024-05-27 ENCOUNTER — Inpatient Hospital Stay (HOSPITAL_COMMUNITY): Admission: EM | Admit: 2024-05-27 | Discharge: 2024-05-29 | Disposition: A | Discharge status: Hospice

## 2024-05-27 ENCOUNTER — Emergency Department (HOSPITAL_COMMUNITY)

## 2024-05-27 ENCOUNTER — Other Ambulatory Visit: Payer: Self-pay

## 2024-05-27 DIAGNOSIS — I7143 Infrarenal abdominal aortic aneurysm, without rupture: Secondary | ICD-10-CM | POA: Diagnosis present

## 2024-05-27 DIAGNOSIS — Z6823 Body mass index (BMI) 23.0-23.9, adult: Secondary | ICD-10-CM

## 2024-05-27 DIAGNOSIS — R6521 Severe sepsis with septic shock: Secondary | ICD-10-CM | POA: Diagnosis present

## 2024-05-27 DIAGNOSIS — T68XXXA Hypothermia, initial encounter: Secondary | ICD-10-CM

## 2024-05-27 DIAGNOSIS — I10 Essential (primary) hypertension: Secondary | ICD-10-CM | POA: Diagnosis present

## 2024-05-27 DIAGNOSIS — J690 Pneumonitis due to inhalation of food and vomit: Secondary | ICD-10-CM | POA: Diagnosis present

## 2024-05-27 DIAGNOSIS — R54 Age-related physical debility: Secondary | ICD-10-CM | POA: Diagnosis present

## 2024-05-27 DIAGNOSIS — I9589 Other hypotension: Secondary | ICD-10-CM | POA: Diagnosis present

## 2024-05-27 DIAGNOSIS — L89159 Pressure ulcer of sacral region, unspecified stage: Secondary | ICD-10-CM | POA: Diagnosis present

## 2024-05-27 DIAGNOSIS — Z801 Family history of malignant neoplasm of trachea, bronchus and lung: Secondary | ICD-10-CM

## 2024-05-27 DIAGNOSIS — Z9049 Acquired absence of other specified parts of digestive tract: Secondary | ICD-10-CM

## 2024-05-27 DIAGNOSIS — Z66 Do not resuscitate: Secondary | ICD-10-CM | POA: Diagnosis not present

## 2024-05-27 DIAGNOSIS — D696 Thrombocytopenia, unspecified: Secondary | ICD-10-CM | POA: Diagnosis present

## 2024-05-27 DIAGNOSIS — F259 Schizoaffective disorder, unspecified: Secondary | ICD-10-CM | POA: Diagnosis present

## 2024-05-27 DIAGNOSIS — G9341 Metabolic encephalopathy: Secondary | ICD-10-CM | POA: Diagnosis present

## 2024-05-27 DIAGNOSIS — E162 Hypoglycemia, unspecified: Secondary | ICD-10-CM | POA: Diagnosis present

## 2024-05-27 DIAGNOSIS — F17210 Nicotine dependence, cigarettes, uncomplicated: Secondary | ICD-10-CM | POA: Diagnosis present

## 2024-05-27 DIAGNOSIS — Z85038 Personal history of other malignant neoplasm of large intestine: Secondary | ICD-10-CM

## 2024-05-27 DIAGNOSIS — Z8042 Family history of malignant neoplasm of prostate: Secondary | ICD-10-CM

## 2024-05-27 DIAGNOSIS — A419 Sepsis, unspecified organism: Principal | ICD-10-CM | POA: Diagnosis present

## 2024-05-27 DIAGNOSIS — Z8249 Family history of ischemic heart disease and other diseases of the circulatory system: Secondary | ICD-10-CM

## 2024-05-27 DIAGNOSIS — Z79899 Other long term (current) drug therapy: Secondary | ICD-10-CM

## 2024-05-27 DIAGNOSIS — E43 Unspecified severe protein-calorie malnutrition: Secondary | ICD-10-CM | POA: Diagnosis present

## 2024-05-27 DIAGNOSIS — Z515 Encounter for palliative care: Secondary | ICD-10-CM

## 2024-05-27 DIAGNOSIS — K529 Noninfective gastroenteritis and colitis, unspecified: Secondary | ICD-10-CM | POA: Diagnosis present

## 2024-05-27 DIAGNOSIS — D689 Coagulation defect, unspecified: Secondary | ICD-10-CM | POA: Diagnosis present

## 2024-05-27 DIAGNOSIS — Z1152 Encounter for screening for COVID-19: Secondary | ICD-10-CM

## 2024-05-27 DIAGNOSIS — L089 Local infection of the skin and subcutaneous tissue, unspecified: Secondary | ICD-10-CM | POA: Diagnosis present

## 2024-05-27 DIAGNOSIS — M726 Necrotizing fasciitis: Secondary | ICD-10-CM | POA: Diagnosis present

## 2024-05-27 DIAGNOSIS — Z87440 Personal history of urinary (tract) infections: Secondary | ICD-10-CM

## 2024-05-27 DIAGNOSIS — N390 Urinary tract infection, site not specified: Secondary | ICD-10-CM | POA: Diagnosis present

## 2024-05-27 DIAGNOSIS — R64 Cachexia: Secondary | ICD-10-CM | POA: Diagnosis present

## 2024-05-27 DIAGNOSIS — D649 Anemia, unspecified: Secondary | ICD-10-CM | POA: Diagnosis present

## 2024-05-27 LAB — URINALYSIS, W/ REFLEX TO CULTURE (INFECTION SUSPECTED)
Bilirubin Urine: NEGATIVE
Glucose, UA: NEGATIVE mg/dL
Ketones, ur: NEGATIVE mg/dL
Nitrite: POSITIVE — AB
Protein, ur: 30 mg/dL — AB
Specific Gravity, Urine: 1.017 (ref 1.005–1.030)
WBC, UA: >50 WBC/hpf (ref 0–5)
pH: 5 (ref 5.0–8.0)

## 2024-05-27 LAB — COMPREHENSIVE METABOLIC PANEL WITH GFR
ALT: 52 U/L — ABNORMAL HIGH (ref 0–44)
AST: 73 U/L — ABNORMAL HIGH (ref 15–41)
Albumin: 2.6 g/dL — ABNORMAL LOW (ref 3.5–5.0)
Alkaline Phosphatase: 191 U/L — ABNORMAL HIGH (ref 38–126)
Anion gap: 11 (ref 5–15)
BUN: 29 mg/dL — ABNORMAL HIGH (ref 8–23)
CO2: 25 mmol/L (ref 22–32)
Calcium: 10.1 mg/dL (ref 8.9–10.3)
Chloride: 105 mmol/L (ref 98–111)
Creatinine, Ser: 1.01 mg/dL (ref 0.61–1.24)
GFR, Estimated: >60 mL/min
Glucose, Bld: 125 mg/dL — ABNORMAL HIGH (ref 70–99)
Potassium: 3.6 mmol/L (ref 3.5–5.1)
Sodium: 141 mmol/L (ref 135–145)
Total Bilirubin: 0.5 mg/dL (ref 0.0–1.2)
Total Protein: 6.8 g/dL (ref 6.5–8.1)

## 2024-05-27 LAB — I-STAT CG4 LACTIC ACID, ED: Lactic Acid, Venous: 3 mmol/L (ref 0.5–1.9)

## 2024-05-27 MED ORDER — LACTATED RINGERS IV SOLN: INTRAVENOUS | Status: DC

## 2024-05-27 MED ORDER — SODIUM CHLORIDE 0.9 % IV SOLN
2.0000 g | Freq: Once | INTRAVENOUS | Status: AC
  Administered 2024-05-27: 2 g via INTRAVENOUS
  Filled 2024-05-27: qty 12.5

## 2024-05-27 MED ORDER — IOHEXOL 350 MG/ML SOLN
75.0000 mL | Freq: Once | INTRAVENOUS | Status: AC | PRN
  Administered 2024-05-27: 75 mL via INTRAVENOUS

## 2024-05-27 MED ORDER — SODIUM CHLORIDE 0.9 % IV BOLUS
1000.0000 mL | Freq: Once | INTRAVENOUS | Status: AC
  Administered 2024-05-27: 1000 mL via INTRAVENOUS

## 2024-05-27 NOTE — ED Provider Notes (Signed)
 " [place] EMERGENCY DEPARTMENT AT [facility] Provider Note   CSN: [id number] Arrival date & time: 05/27/24  2154     Patient presents with: Fatigue   Dustin [name] is a 68 y.o. male.   68 year old male presents via EMS from [facility] with concern for lethargy. Baseline mental status oriented to person. History of UTI, on daily IM abx currently.  Hypothermic on arrival with rectal temp of 90, placed under warmer and septic orders initiated.  Full code per paperwork provided.  Hx schizophrenia, metastatic colon cancer.        Prior to Admission medications  Medication Sig Start Date End Date Taking? Authorizing Provider  midodrine  (PROAMATINE ) 5 MG tablet Take 5 mg by mouth 3 (three) times daily. 10/11/23   [provider]  Nutritional Supplement LIQD Take 120 mLs by mouth every evening.    [provider]  OLANZapine  zydis (ZYPREXA ) 5 MG disintegrating tablet Take 5 mg by mouth See admin instructions. Dissolve 5 mg in the mouth at 8 AM and 8 PM 11/28/23   [provider]  paliperidone  (INVEGA  SUSTENNA) 234 MG/1.5ML injection Inject 234 mg into the muscle every 28 (twenty-eight) days. 04/05/23   Ghimire, Donalda HERO, MD  paliperidone  (INVEGA ) 6 MG 24 hr tablet Take 1 tablet (6 mg total) by mouth daily. 12/09/23   Amin, Sumayya, MD    Allergies: Patient has no known allergies.    Review of Systems Level 5 caveat for poor historian Updated Vital Signs BP 93/63   Pulse 72   Temp (!) 92.6 F (33.7 C)   Resp 12   Ht 5' 7 (1.702 m)   Wt 68 kg   SpO2 100%   BMI 23.48 kg/m   Physical Exam Vitals and nursing note reviewed.  Constitutional:      General: He is awake. He is not in acute distress.    Appearance: He is well-developed. He is cachectic. He is not diaphoretic.  HENT:     Head: Normocephalic and atraumatic.     Mouth/Throat:     Mouth: Mucous membranes are dry.  Eyes:     Conjunctiva/sclera: Conjunctivae normal.   Cardiovascular:     Rate and Rhythm: Normal rate and regular rhythm.     Pulses: Normal pulses.  Pulmonary:     Effort: Pulmonary effort is normal.     Breath sounds: Normal breath sounds.  Abdominal:     Palpations: Abdomen is soft.     Tenderness: There is abdominal tenderness.  Musculoskeletal:     Right lower leg: No edema.     Left lower leg: No edema.  Skin:    General: Skin is dry.     Comments: Sacral skin breakdown, dressing in place and removed for exam large eschar to sacral area, no crepitus, no tunneling lesions   Neurological:     General: No focal deficit present.  Psychiatric:        Behavior: Behavior normal.     (all labs ordered are listed, but only abnormal results are displayed) Labs Reviewed  COMPREHENSIVE METABOLIC PANEL WITH GFR - Abnormal; Notable for the following components:      Result Value   Glucose, Bld 125 (*)    BUN 29 (*)    Albumin  2.6 (*)    AST 73 (*)    ALT 52 (*)    Alkaline Phosphatase 191 (*)    All other components within normal limits  CBC WITH DIFFERENTIAL/PLATELET -  Abnormal; Notable for the following components:   WBC 11.5 (*)    Hemoglobin 12.4 (*)    HCT 37.7 (*)    RDW 18.0 (*)    Platelets 59 (*)    nRBC 0.3 (*)    Neutro Abs 10.5 (*)    Lymphs Abs 0.5 (*)    Abs Immature Granulocytes 0.17 (*)    All other components within normal limits  URINALYSIS, W/ REFLEX TO CULTURE (INFECTION SUSPECTED) - Abnormal; Notable for the following components:   APPearance CLOUDY (*)    Hgb urine dipstick MODERATE (*)    Protein, ur 30 (*)    Nitrite POSITIVE (*)    Leukocytes,Ua LARGE (*)    Bacteria, UA MANY (*)    All other components within normal limits  PROTIME-INR - Abnormal; Notable for the following components:   Prothrombin Time 18.8 (*)    INR 1.5 (*)    All other components within normal limits  CBC - Abnormal; Notable for the following components:   WBC 11.0 (*)    RBC 3.56 (*)    Hemoglobin 10.2 (*)    HCT 31.3  (*)    RDW 18.1 (*)    Platelets 55 (*)    nRBC 0.3 (*)    All other components within normal limits  COMPREHENSIVE METABOLIC PANEL WITH GFR - Abnormal; Notable for the following components:   Chloride 112 (*)    CO2 20 (*)    Glucose, Bld 64 (*)    BUN 24 (*)    Calcium 8.5 (*)    Total Protein 5.2 (*)    Albumin  1.9 (*)    AST 65 (*)    Alkaline Phosphatase 154 (*)    All other components within normal limits  I-STAT CG4 LACTIC ACID, ED - Abnormal; Notable for the following components:   Lactic Acid, Venous 3.0 (*)    All other components within normal limits  RESP PANEL BY RT-PCR (RSV, FLU A&B, COVID)  RVPGX2  CULTURE, BLOOD (ROUTINE X 2)  CULTURE, BLOOD (ROUTINE X 2)  URINE CULTURE  MRSA NEXT GEN BY PCR, NASAL  AEROBIC/ANAEROBIC CULTURE W GRAM STAIN (SURGICAL/DEEP WOUND)  MAGNESIUM   PHOSPHORUS  STREP PNEUMONIAE URINARY ANTIGEN  LEGIONELLA PNEUMOPHILA SEROGP 1 UR AG  I-STAT CG4 LACTIC ACID, ED  TYPE AND SCREEN  PREPARE PLATELET PHERESIS    EKG: EKG Interpretation Date/Time:  Tuesday May 27 2024 22:10:12 EST Ventricular Rate:  63 PR Interval:  228 QRS Duration:  78 QT Interval:  406 QTC Calculation: 415 R Axis:   24  Text Interpretation: Sinus rhythm with 1st degree A-V block ST & T wave abnormality, consider inferior ischemia ST & T wave abnormality, consider anterolateral ischemia Abnormal ECG When compared with ECG of 04-Dec-2023 13:21, HEART RATE has increased ST-t abnormality is now present Confirmed by Raford Lenis ([id number]) on 05/28/2024 12:38:49 AM  Radiology: CT CHEST ABDOMEN PELVIS W CONTRAST Result Date: 05/28/2024 EXAM: CT CHEST, ABDOMEN AND PELVIS WITH CONTRAST 05/27/2024 11:54:00 PM TECHNIQUE: CT of the chest, abdomen and pelvis was performed with the administration of intravenous contrast. Multiplanar reformatted images are provided for review. Automated exposure control, iterative reconstruction, and/or weight based adjustment of the mA/kV was  utilized to reduce the radiation dose to as low as reasonably achievable. COMPARISON: Portable chest today, portable chest 12/04/2023, and chest abdomen and pelvis CTs with contrast dated 07/20/2022 and 10/09/2021. CLINICAL HISTORY: Sepsis with a questionable left infrahilar infiltrate on the chest x-ray from today. FINDINGS:  CHEST: MEDIASTINUM AND LYMPH NODES: The heart is slightly enlarged. There are left main and patchy moderate 3-vessel coronary calcifications. There is mild aortic tortuosity and atherosclerosis without aneurysm, stenosis, or dissection. The great vessels are clear. The esophagus is patulous which may be seen in chronic dysmotility, but has a normal wall thickness. There is a tiny hiatal hernia. There is debris in the left main bronchus. The trachea and right main bronchus are clear. No mediastinal, hilar or axillary lymphadenopathy. LUNGS AND PLEURA: The lungs are moderately emphysematous with centrilobular changes predominating. There is patchy consolidation in the posteromedial basal and superior segments of the left lower lobe with scattered subsegmental bronchial impactions. Findings consistent with pneumonia with possible aspiration. The lungs are otherwise clear. No pleural effusion or pneumothorax. ABDOMEN AND PELVIS: LIVER: The liver is mildly steatotic without mass. GALLBLADDER AND BILE DUCTS: Gallbladder is unremarkable. No biliary ductal dilatation. SPLEEN: No acute abnormality. PANCREAS: No acute abnormality. ADRENAL GLANDS: The left adrenal gland is unremarkable. There is a new heterogeneous right adrenal nodule measuring 2.0 x 1.4 cm best seen on series 11 axial 18. Chemical shift MRI is recommended, without and with contrast. KIDNEYS, URETERS AND BLADDER: No intrarenal stones are noted bilaterally. On the right there is no hydroureteronephrosis, but there is a 3 mm stone in the distal right ureter 3 cm proximal to the UVJ. There are no other ureteral stones. There is thickening  in the bladder with perivesical stranding worrisome for cystitis. Correlate with urinalysis. The wall thickening could have a hypertrophic component due to enlargement of the adjacent prostate. GI AND BOWEL: The stomach is unremarkable, partially contracted. Small bowel normal caliber. There is an old right hemicolectomy. There is increased rectal fecal retention, rectal wall thickening, and perirectal stranding consistent with stercoral colitis. REPRODUCTIVE ORGANS: The prostate is mildly enlarged. Both testicles are retracted into the inguinal canals. PERITONEUM AND RETROPERITONEUM: No ascites. No free air. VASCULATURE: There is moderate to heavy aortic and branch vessel atherosclerosis. Infrarenal abdominal aortic aneurysm is again noted with partial thrombosis, today measuring 4.1 x 3.7 cm on series 2 axial image 83, was previously 3.9 x 3.4 cm. ABDOMINAL AND PELVIS LYMPH NODES: No lymphadenopathy. BONES AND SOFT TISSUES: There is degenerative change, osteopenia, and mild dextroscoliosis of the thoracic spine. The rib cage is intact. There is osteopenia with degenerative changes in the lumbar spine and hip joints, relatively mild. Mild dendritic gynecomastia with no significant chest wall findings. Who There is interval new demonstration of patchy soft tissue gas overlying the dorsal distal sacrum and coccyx, findings consistent with necrotizing fasciitis and gas forming infection with a likely impending sacral decubitus ulcer. There are no overt destructive changes in the sacrum and coccyx. Numerous calcified injection granulomas in both buttocks. . No other acute soft tissue abnormality. IMPRESSION: 1. Patchy soft tissue gas overlying the dorsal distal sacrum and coccyx, consistent with necrotizing fasciitis and gas forming infection with a likely impending sacral decubitus ulcer, without overt destructive changes in the underlying sacrum and coccyx. 2. Patchy consolidation in the posteromedial basal and  superior segments of the left lower lobe with scattered subsegmental bronchial impactions, consistent with pneumonia with possible aspiration. 3. Bladder wall thickening with perivesical stranding, worrisome for cystitis. 4. Increased rectal fecal retention with rectal wall thickening and perirectal stranding, consistent with stercoral colitis. 5. Infrarenal abdominal aortic aneurysm with partial thrombosis measuring 4.1 x 3.7 cm, increased from 3.9 x 3.4 cm, with recommendation for CT or MRI follow-up in 12 months and  to establish or continue care with a vascular specialist. 6. New heterogeneous right adrenal nodule measuring 2.0 x 1.4 cm, with recommendation for chemical shift MRI without and with contrast. 7. Moderate pulmonary emphysema, which is an independent risk factor for lung cancer, with recommendation to consider evaluation for a low-dose CT lung cancer screening program. 8. 3 mm stone in the distal right ureter approximately 3 cm proximal to the UVJ. 9. Cardiomegaly, aortic and coronary atherosclerosis, and additional findings discussed above. . 10. Discussed in detail over the phone with Dr. Leita Chancy at 12:31 am, 05/28/2024. Electronically signed by: Francis Quam MD 05/28/2024 12:40 AM EST RP Workstation: [id number]   DG Chest Port 1 View Result Date: 05/27/2024 CLINICAL DATA:  Possible sepsis EXAM: PORTABLE CHEST 1 VIEW COMPARISON:  12/04/2023, CT 07/20/2022 FINDINGS: Emphysema and chronic bronchitic changes. Streaky atelectasis versus mild infiltrate in the left infrahilar lung. Stable cardiomediastinal silhouette with aortic atherosclerosis. No pneumothorax IMPRESSION: Emphysema and chronic bronchitic changes. Streaky atelectasis versus mild infiltrate in the left infrahilar lung. Electronically Signed   By: Luke Bun M.D.   On: 05/27/2024 22:49     .Critical Care  Performed by: Chancy Leita LABOR, PA-C Authorized by: Chancy Leita LABOR, PA-C   Critical care provider statement:     Critical care time (minutes):  75   Critical care was time spent personally by me on the following activities:  Development of treatment plan with patient or surrogate, discussions with consultants, evaluation of patient's response to treatment, examination of patient, ordering and review of laboratory studies, ordering and review of radiographic studies, ordering and performing treatments and interventions, pulse oximetry, re-evaluation of patient's condition and review of old charts    Medications Ordered in the ED  lactated ringers  infusion ( Intravenous Anesthesia Volume Adjustment 05/28/24 0408)  Chlorhexidine  Gluconate Cloth 2 % PADS 6 each ( Topical Automatically Held 06/05/24 1000)  docusate sodium  (COLACE) capsule 100 mg ( Oral MAR Hold 05/28/24 0303)  polyethylene glycol (MIRALAX  / GLYCOLAX ) packet 17 g ( Oral MAR Hold 05/28/24 0303)  ondansetron  (ZOFRAN ) injection 4 mg ( Intravenous MAR Hold 05/28/24 0303)  insulin  aspart (novoLOG ) injection 0-9 Units ( Subcutaneous Automatically Held 06/05/24 2000)  0.9 %  sodium chloride  infusion ( Intravenous Anesthesia Volume Adjustment 05/28/24 0408)  norepinephrine  (LEVOPHED ) 4mg  in (0.016 mg/mL) premix infusion (15 mcg/min Intravenous Rate/Dose Change 05/28/24 0358)  vancomycin  (VANCOREADY) IVPB 750 mg/150 mL ( Intravenous Automatically Held 06/05/24 1200)  sodium hypochlorite (DAKIN'S 1/4 STRENGTH) topical solution ( Irrigation Automatically Held 05/28/24 0315)  0.9 % irrigation (POUR BTL) (1,000 mLs Irrigation Given 05/28/24 0322)  sodium hypochlorite (DAKIN'S 1/4 STRENGTH) topical solution (1 Application Irrigation Given 05/28/24 0339)  0.9 %  sodium chloride  infusion (Manually program via Guardrails IV Fluids) (has no administration in time range)  sodium chloride  0.9 % bolus 1,000 mL (0 mLs Intravenous Stopped 05/28/24 0020)  sodium chloride  0.9 % bolus 1,000 mL (0 mLs Intravenous Stopped 05/28/24 0130)  ceFEPIme  (MAXIPIME ) 2 g in sodium  chloride 0.9 % 100 mL IVPB (0 g Intravenous Stopped 05/28/24 0016)  iohexol  (OMNIPAQUE ) 350 MG/ML injection 75 mL (75 mLs Intravenous Contrast Given 05/27/24 2354)  sodium chloride  0.9 % bolus 1,000 mL (0 mLs Intravenous Stopped 05/28/24 0115)  vancomycin  (VANCOREADY) IVPB 1500 mg/300 mL (1,500 mg Intravenous New Bag/Given 05/28/24 0114)  clindamycin  (CLEOCIN ) IVPB 600 mg (0 mg Intravenous Stopped 05/28/24 0115)  Medical Decision Making Amount and/or Complexity of Data Reviewed Labs: ordered. Radiology: ordered.  Risk Prescription drug management. Decision regarding hospitalization.   This patient presents to the ED for concern of lethargy, this involves an extensive number of treatment options, and is a complaint that carries with it a high risk of complications and morbidity.  The differential diagnosis includes but not limited to sepsis, UTI, PNA, viral illness (covid/flu/rsv), intraabdominal infection/obstruction   Co morbidities / Chronic conditions that complicate the patient evaluation  Schizophrenia, adenocarcinoma of the colon, UTI   Additional history obtained:  Additional history obtained from EMR External records from outside source obtained and reviewed including prior labs and imaging on file   Lab Tests:  I Ordered, and personally interpreted labs.  The pertinent results include: Initial lactic acid is elevated at 3, repeat after fluid bolus down to 1.7.  CBC with hemoglobin of 12.4, WBC 11.  CMP with elevated LFTs.  Urinalysis with nitrites, large leukocytes with many bacteria, sent for culture.  INR mildly elevated at 1.5.   Imaging Studies ordered:  I ordered imaging studies including CXR, CT c/a/p  I independently visualized and interpreted imaging which showed PNA, concern for gas overlying the sacrum  I agree with the radiologist interpretation   Cardiac Monitoring: / EKG:  The patient was maintained on a cardiac  monitor.  I personally viewed and interpreted the cardiac monitored which showed an underlying rhythm of: sinus rhythm, rate 63   Problem List / ED Course / Critical interventions / Medication management  68 year old male here from facility for lethargy, hypotensive and hypothermic on arrival. Sepsis protocol initiated.  Found to have possible urinary tract infection, concern for necrotizing fasciitis overlying the sacral area where he has a large eschar/decubitus ulcer.,  Possible pneumonia on CT with concern for aspiration.  Provided with broad-spectrum antibiotics and IV fluids.  Admitted to the ICU with consult to critical care and general surgery. I ordered medication including IV fluids per sepsis protocol, broad spectrum antibiotics    Reevaluation of the patient after these medicines showed that the patient BP remained low/stable  I have reviewed the patients home medicines and have made adjustments as needed   Consultations Obtained:  I requested consultation with the general surgeon, Dr. Ann,  and discussed lab and imaging findings as well as pertinent plan - they recommend: will see the patient Call from radiology for CT c/a/p to discuss findings Case discussed with Dr. Haze who has seen the patient  Discussed with Dr. Maree with critical care who will consult for admission   Social Determinants of Health:  Lives at facility    Test / Admission - Considered:  admit      Final diagnoses:  Sepsis, due to unspecified organism, unspecified whether acute organ dysfunction present [facility])  Urinary tract infection in male  Aspiration pneumonia of lower lobe, unspecified aspiration pneumonia type, unspecified laterality (HCC)  Necrotizing fasciitis (HCC)  Colitis  Hypothermia, initial encounter    ED Discharge Orders     None          Beverley Leita LABOR, PA-C 05/28/24 0409    Haze Lonni PARAS, MD 05/28/24 0425  "

## 2024-05-27 NOTE — ED Triage Notes (Signed)
 BIB [facility] from [facility] with recent dx of UTI that pt has been receiving IM abx tx. Pt has had an increase in lethargy with baseline being a&o x1.   BP 104/74 (hx of hypotension) HR 70 Resp 14 End Tidal CO2 32 Spo2 99% RA CBG 123

## 2024-05-27 NOTE — Sepsis Progress Note (Signed)
 Elink monitoring for the code sepsis protocol.

## 2024-05-27 NOTE — ED Notes (Signed)
 NT attempted X2 to get a temp orally, and x1 to get an axillary temp. Was not able to obtain temp.

## 2024-05-27 NOTE — ED Notes (Signed)
 Bair hugger applied once pt roomed.

## 2024-05-28 ENCOUNTER — Other Ambulatory Visit: Payer: Self-pay

## 2024-05-28 ENCOUNTER — Inpatient Hospital Stay (HOSPITAL_COMMUNITY): Admitting: Anesthesiology

## 2024-05-28 ENCOUNTER — Encounter (HOSPITAL_COMMUNITY): Payer: Self-pay

## 2024-05-28 ENCOUNTER — Inpatient Hospital Stay (HOSPITAL_COMMUNITY)

## 2024-05-28 ENCOUNTER — Encounter (HOSPITAL_COMMUNITY)
Admission: EM | Disposition: A | Discharge status: Hospice | Payer: Self-pay | Source: Skilled Nursing Facility | Attending: Pulmonary Disease

## 2024-05-28 DIAGNOSIS — D649 Anemia, unspecified: Secondary | ICD-10-CM | POA: Diagnosis present

## 2024-05-28 DIAGNOSIS — Z8249 Family history of ischemic heart disease and other diseases of the circulatory system: Secondary | ICD-10-CM | POA: Diagnosis not present

## 2024-05-28 DIAGNOSIS — G9340 Encephalopathy, unspecified: Secondary | ICD-10-CM | POA: Diagnosis not present

## 2024-05-28 DIAGNOSIS — D696 Thrombocytopenia, unspecified: Secondary | ICD-10-CM | POA: Diagnosis present

## 2024-05-28 DIAGNOSIS — T68XXXA Hypothermia, initial encounter: Secondary | ICD-10-CM | POA: Diagnosis not present

## 2024-05-28 DIAGNOSIS — I959 Hypotension, unspecified: Secondary | ICD-10-CM

## 2024-05-28 DIAGNOSIS — L89159 Pressure ulcer of sacral region, unspecified stage: Secondary | ICD-10-CM

## 2024-05-28 DIAGNOSIS — Z515 Encounter for palliative care: Secondary | ICD-10-CM

## 2024-05-28 DIAGNOSIS — E43 Unspecified severe protein-calorie malnutrition: Secondary | ICD-10-CM | POA: Diagnosis present

## 2024-05-28 DIAGNOSIS — L089 Local infection of the skin and subcutaneous tissue, unspecified: Secondary | ICD-10-CM | POA: Diagnosis present

## 2024-05-28 DIAGNOSIS — M726 Necrotizing fasciitis: Secondary | ICD-10-CM | POA: Diagnosis present

## 2024-05-28 DIAGNOSIS — R578 Other shock: Secondary | ICD-10-CM

## 2024-05-28 DIAGNOSIS — R6521 Severe sepsis with septic shock: Secondary | ICD-10-CM | POA: Diagnosis present

## 2024-05-28 DIAGNOSIS — J690 Pneumonitis due to inhalation of food and vomit: Secondary | ICD-10-CM

## 2024-05-28 DIAGNOSIS — M7989 Other specified soft tissue disorders: Secondary | ICD-10-CM

## 2024-05-28 DIAGNOSIS — Z6823 Body mass index (BMI) 23.0-23.9, adult: Secondary | ICD-10-CM | POA: Diagnosis not present

## 2024-05-28 DIAGNOSIS — R7401 Elevation of levels of liver transaminase levels: Secondary | ICD-10-CM | POA: Diagnosis not present

## 2024-05-28 DIAGNOSIS — R64 Cachexia: Secondary | ICD-10-CM | POA: Diagnosis present

## 2024-05-28 DIAGNOSIS — G9341 Metabolic encephalopathy: Secondary | ICD-10-CM | POA: Diagnosis present

## 2024-05-28 DIAGNOSIS — F209 Schizophrenia, unspecified: Secondary | ICD-10-CM | POA: Diagnosis not present

## 2024-05-28 DIAGNOSIS — Z66 Do not resuscitate: Secondary | ICD-10-CM

## 2024-05-28 DIAGNOSIS — I7143 Infrarenal abdominal aortic aneurysm, without rupture: Secondary | ICD-10-CM | POA: Diagnosis present

## 2024-05-28 DIAGNOSIS — F259 Schizoaffective disorder, unspecified: Secondary | ICD-10-CM | POA: Diagnosis present

## 2024-05-28 DIAGNOSIS — N390 Urinary tract infection, site not specified: Secondary | ICD-10-CM

## 2024-05-28 DIAGNOSIS — R680 Hypothermia, not associated with low environmental temperature: Secondary | ICD-10-CM

## 2024-05-28 DIAGNOSIS — Z9049 Acquired absence of other specified parts of digestive tract: Secondary | ICD-10-CM | POA: Diagnosis not present

## 2024-05-28 DIAGNOSIS — C189 Malignant neoplasm of colon, unspecified: Secondary | ICD-10-CM

## 2024-05-28 DIAGNOSIS — F17210 Nicotine dependence, cigarettes, uncomplicated: Secondary | ICD-10-CM

## 2024-05-28 DIAGNOSIS — Z1152 Encounter for screening for COVID-19: Secondary | ICD-10-CM | POA: Diagnosis not present

## 2024-05-28 DIAGNOSIS — A419 Sepsis, unspecified organism: Secondary | ICD-10-CM | POA: Diagnosis present

## 2024-05-28 DIAGNOSIS — Z85038 Personal history of other malignant neoplasm of large intestine: Secondary | ICD-10-CM | POA: Diagnosis not present

## 2024-05-28 DIAGNOSIS — D689 Coagulation defect, unspecified: Secondary | ICD-10-CM | POA: Diagnosis present

## 2024-05-28 DIAGNOSIS — I10 Essential (primary) hypertension: Secondary | ICD-10-CM | POA: Diagnosis present

## 2024-05-28 HISTORY — PX: IRRIGATION AND DEBRIDEMENT OF NECROTIZING SOFT TISSUE INFECTION: SHX7317

## 2024-05-28 LAB — RESP PANEL BY RT-PCR (RSV, FLU A&B, COVID)  RVPGX2
Influenza A by PCR: NEGATIVE
Influenza B by PCR: NEGATIVE
Resp Syncytial Virus by PCR: NEGATIVE
SARS Coronavirus 2 by RT PCR: NEGATIVE

## 2024-05-28 LAB — PROTIME-INR
INR: 1.5 — ABNORMAL HIGH (ref 0.8–1.2)
Prothrombin Time: 18.8 s — ABNORMAL HIGH (ref 11.4–15.2)

## 2024-05-28 LAB — CBC
HCT: 31.3 % — ABNORMAL LOW (ref 39.0–52.0)
Hemoglobin: 10.2 g/dL — ABNORMAL LOW (ref 13.0–17.0)
MCH: 28.7 pg (ref 26.0–34.0)
MCHC: 32.6 g/dL (ref 30.0–36.0)
MCV: 87.9 fL (ref 80.0–100.0)
Platelets: 55 K/uL — ABNORMAL LOW (ref 150–400)
RBC: 3.56 MIL/uL — ABNORMAL LOW (ref 4.22–5.81)
RDW: 18.1 % — ABNORMAL HIGH (ref 11.5–15.5)
WBC: 11 K/uL — ABNORMAL HIGH (ref 4.0–10.5)
nRBC: 0.3 % — ABNORMAL HIGH (ref 0.0–0.2)

## 2024-05-28 LAB — ECHOCARDIOGRAM COMPLETE
Area-P 1/2: 4.8 cm2
Calc EF: 68.1 %
Height: 67 in
S' Lateral: 2.7 cm
Single Plane A2C EF: 65 %
Single Plane A4C EF: 68.4 %
Weight: 2201.07 [oz_av]

## 2024-05-28 LAB — CBC WITH DIFFERENTIAL/PLATELET
Abs Immature Granulocytes: 0.17 K/uL — ABNORMAL HIGH (ref 0.00–0.07)
Basophils Absolute: 0.1 K/uL (ref 0.0–0.1)
Basophils Relative: 1 %
Eosinophils Absolute: 0 K/uL (ref 0.0–0.5)
Eosinophils Relative: 0 %
HCT: 37.7 % — ABNORMAL LOW (ref 39.0–52.0)
Hemoglobin: 12.4 g/dL — ABNORMAL LOW (ref 13.0–17.0)
Immature Granulocytes: 2 %
Lymphocytes Relative: 4 %
Lymphs Abs: 0.5 K/uL — ABNORMAL LOW (ref 0.7–4.0)
MCH: 28.1 pg (ref 26.0–34.0)
MCHC: 32.9 g/dL (ref 30.0–36.0)
MCV: 85.5 fL (ref 80.0–100.0)
Monocytes Absolute: 0.3 K/uL (ref 0.1–1.0)
Monocytes Relative: 3 %
Neutro Abs: 10.5 K/uL — ABNORMAL HIGH (ref 1.7–7.7)
Neutrophils Relative %: 90 %
Platelets: 59 K/uL — ABNORMAL LOW (ref 150–400)
RBC: 4.41 MIL/uL (ref 4.22–5.81)
RDW: 18 % — ABNORMAL HIGH (ref 11.5–15.5)
Smear Review: NORMAL
WBC: 11.5 K/uL — ABNORMAL HIGH (ref 4.0–10.5)
nRBC: 0.3 % — ABNORMAL HIGH (ref 0.0–0.2)

## 2024-05-28 LAB — MRSA NEXT GEN BY PCR, NASAL: MRSA by PCR Next Gen: DETECTED — AB

## 2024-05-28 LAB — I-STAT CG4 LACTIC ACID, ED: Lactic Acid, Venous: 1.7 mmol/L (ref 0.5–1.9)

## 2024-05-28 LAB — COMPREHENSIVE METABOLIC PANEL WITH GFR
ALT: 42 U/L (ref 0–44)
AST: 65 U/L — ABNORMAL HIGH (ref 15–41)
Albumin: 1.9 g/dL — ABNORMAL LOW (ref 3.5–5.0)
Alkaline Phosphatase: 154 U/L — ABNORMAL HIGH (ref 38–126)
Anion gap: 8 (ref 5–15)
BUN: 24 mg/dL — ABNORMAL HIGH (ref 8–23)
CO2: 20 mmol/L — ABNORMAL LOW (ref 22–32)
Calcium: 8.5 mg/dL — ABNORMAL LOW (ref 8.9–10.3)
Chloride: 112 mmol/L — ABNORMAL HIGH (ref 98–111)
Creatinine, Ser: 0.78 mg/dL (ref 0.61–1.24)
GFR, Estimated: >60 mL/min
Glucose, Bld: 64 mg/dL — ABNORMAL LOW (ref 70–99)
Potassium: 3.5 mmol/L (ref 3.5–5.1)
Sodium: 141 mmol/L (ref 135–145)
Total Bilirubin: 0.4 mg/dL (ref 0.0–1.2)
Total Protein: 5.2 g/dL — ABNORMAL LOW (ref 6.5–8.1)

## 2024-05-28 LAB — TYPE AND SCREEN
ABO/RH(D): B POS
Antibody Screen: NEGATIVE

## 2024-05-28 LAB — GLUCOSE, CAPILLARY
Glucose-Capillary: 113 mg/dL — ABNORMAL HIGH (ref 70–99)
Glucose-Capillary: 58 mg/dL — ABNORMAL LOW (ref 70–99)

## 2024-05-28 LAB — MAGNESIUM: Magnesium: 1.7 mg/dL (ref 1.7–2.4)

## 2024-05-28 LAB — PHOSPHORUS: Phosphorus: 2.8 mg/dL (ref 2.5–4.6)

## 2024-05-28 LAB — STREP PNEUMONIAE URINARY ANTIGEN: Strep Pneumo Urinary Antigen: NEGATIVE

## 2024-05-28 MED ORDER — DEXAMETHASONE SOD PHOSPHATE PF 10 MG/ML IJ SOLN
INTRAMUSCULAR | Status: DC | PRN
  Administered 2024-05-28: 10 mg via INTRAVENOUS

## 2024-05-28 MED ORDER — LIDOCAINE 2% (20 MG/ML) 5 ML SYRINGE
INTRAMUSCULAR | Status: DC | PRN
  Administered 2024-05-28: 40 mg via INTRAVENOUS

## 2024-05-28 MED ORDER — POLYETHYLENE GLYCOL 3350 17 G PO PACK: 17.0000 g | PACK | Freq: Every day | ORAL | Status: DC | PRN

## 2024-05-28 MED ORDER — MORPHINE SULFATE (PF) 2 MG/ML IV SOLN
2.0000 mg | INTRAVENOUS | Status: DC | PRN
  Administered 2024-05-28: 2 mg via INTRAVENOUS
  Filled 2024-05-28: qty 1

## 2024-05-28 MED ORDER — OXYCODONE HCL 5 MG/5ML PO SOLN: 5.0000 mg | Freq: Once | ORAL | Status: DC | PRN

## 2024-05-28 MED ORDER — CHLORHEXIDINE GLUCONATE CLOTH 2 % EX PADS
6.0000 | MEDICATED_PAD | Freq: Every day | CUTANEOUS | Status: DC
  Administered 2024-05-28: 6 via TOPICAL

## 2024-05-28 MED ORDER — ACETAMINOPHEN 325 MG PO TABS: 650.0000 mg | ORAL_TABLET | Freq: Four times a day (QID) | ORAL | Status: DC | PRN

## 2024-05-28 MED ORDER — DOCUSATE SODIUM 100 MG PO CAPS: 100.0000 mg | ORAL_CAPSULE | Freq: Two times a day (BID) | ORAL | Status: DC | PRN

## 2024-05-28 MED ORDER — VANCOMYCIN HCL 750 MG/150ML IV SOLN
750.0000 mg | Freq: Two times a day (BID) | INTRAVENOUS | Status: DC
  Filled 2024-05-28: qty 150

## 2024-05-28 MED ORDER — ROCURONIUM BROMIDE 10 MG/ML (PF) SYRINGE
PREFILLED_SYRINGE | INTRAVENOUS | Status: DC | PRN
  Administered 2024-05-28: 40 mg via INTRAVENOUS

## 2024-05-28 MED ORDER — VASOPRESSIN 20 UNITS/100 ML INFUSION FOR SHOCK: 0.0400 [IU]/min | INTRAVENOUS | Status: DC

## 2024-05-28 MED ORDER — SODIUM CHLORIDE 0.9% IV SOLUTION: Freq: Once | INTRAVENOUS | Status: AC

## 2024-05-28 MED ORDER — DEXTROSE 50 % IV SOLN
INTRAVENOUS | Status: AC
  Filled 2024-05-28: qty 50

## 2024-05-28 MED ORDER — LACTATED RINGERS IV BOLUS
1000.0000 mL | Freq: Once | INTRAVENOUS | Status: AC
  Administered 2024-05-28: 1000 mL via INTRAVENOUS

## 2024-05-28 MED ORDER — SUCCINYLCHOLINE CHLORIDE 200 MG/10ML IV SOSY
PREFILLED_SYRINGE | INTRAVENOUS | Status: DC | PRN
  Administered 2024-05-28: 120 mg via INTRAVENOUS

## 2024-05-28 MED ORDER — ONDANSETRON HCL 4 MG/2ML IJ SOLN: 4.0000 mg | Freq: Four times a day (QID) | INTRAMUSCULAR | Status: DC | PRN

## 2024-05-28 MED ORDER — ROCURONIUM BROMIDE 10 MG/ML (PF) SYRINGE
PREFILLED_SYRINGE | INTRAVENOUS | Status: AC
  Filled 2024-05-28: qty 10

## 2024-05-28 MED ORDER — MORPHINE SULFATE (PF) 2 MG/ML IV SOLN
2.0000 mg | Freq: Four times a day (QID) | INTRAVENOUS | Status: DC
  Administered 2024-05-28 – 2024-05-29 (×3): 2 mg via INTRAVENOUS
  Filled 2024-05-28 (×3): qty 1

## 2024-05-28 MED ORDER — ORAL CARE MOUTH RINSE
15.0000 mL | OROMUCOSAL | Status: DC | PRN
  Administered 2024-05-28: 15 mL via OROMUCOSAL

## 2024-05-28 MED ORDER — SODIUM CHLORIDE 0.9 % IV SOLN: INTRAVENOUS | Status: DC

## 2024-05-28 MED ORDER — POTASSIUM CHLORIDE 10 MEQ/100ML IV SOLN
10.0000 meq | INTRAVENOUS | Status: DC
  Administered 2024-05-28 (×2): 10 meq via INTRAVENOUS
  Filled 2024-05-28 (×3): qty 100

## 2024-05-28 MED ORDER — SODIUM CHLORIDE 0.9 % IV SOLN
250.0000 mL | INTRAVENOUS | Status: DC
  Administered 2024-05-28: 250 mL via INTRAVENOUS

## 2024-05-28 MED ORDER — HALOPERIDOL LACTATE 5 MG/ML IJ SOLN: 5.0000 mg | INTRAMUSCULAR | Status: DC | PRN

## 2024-05-28 MED ORDER — ACETAMINOPHEN 650 MG RE SUPP: 650.0000 mg | Freq: Four times a day (QID) | RECTAL | Status: DC | PRN

## 2024-05-28 MED ORDER — LINEZOLID 600 MG/300ML IV SOLN
600.0000 mg | Freq: Two times a day (BID) | INTRAVENOUS | Status: DC
  Administered 2024-05-28: 600 mg via INTRAVENOUS
  Filled 2024-05-28 (×2): qty 300

## 2024-05-28 MED ORDER — MIDAZOLAM HCL 2 MG/2ML IJ SOLN
INTRAMUSCULAR | Status: AC
  Filled 2024-05-28: qty 2

## 2024-05-28 MED ORDER — CLINDAMYCIN PHOSPHATE 600 MG/50ML IV SOLN
600.0000 mg | Freq: Once | INTRAVENOUS | Status: AC
  Administered 2024-05-28: 600 mg via INTRAVENOUS
  Filled 2024-05-28: qty 50

## 2024-05-28 MED ORDER — POLYVINYL ALCOHOL 1.4 % OP SOLN: 1.0000 [drp] | Freq: Four times a day (QID) | OPHTHALMIC | Status: DC | PRN

## 2024-05-28 MED ORDER — LIDOCAINE 2% (20 MG/ML) 5 ML SYRINGE
INTRAMUSCULAR | Status: AC
  Filled 2024-05-28: qty 5

## 2024-05-28 MED ORDER — PROPOFOL 10 MG/ML IV BOLUS
INTRAVENOUS | Status: AC
  Filled 2024-05-28: qty 20

## 2024-05-28 MED ORDER — 0.9 % SODIUM CHLORIDE (POUR BTL) OPTIME
TOPICAL | Status: DC | PRN
  Administered 2024-05-28: 1000 mL

## 2024-05-28 MED ORDER — SUCCINYLCHOLINE CHLORIDE 200 MG/10ML IV SOSY
PREFILLED_SYRINGE | INTRAVENOUS | Status: AC
  Filled 2024-05-28: qty 10

## 2024-05-28 MED ORDER — ATROPINE SULFATE 1 % OP SOLN: 4.0000 [drp] | OPHTHALMIC | Status: DC | PRN

## 2024-05-28 MED ORDER — SUGAMMADEX SODIUM 200 MG/2ML IV SOLN
INTRAVENOUS | Status: DC | PRN
  Administered 2024-05-28: 200 mg via INTRAVENOUS

## 2024-05-28 MED ORDER — EPHEDRINE 5 MG/ML INJ
INTRAVENOUS | Status: AC
  Filled 2024-05-28: qty 5

## 2024-05-28 MED ORDER — FENTANYL CITRATE (PF) 250 MCG/5ML IJ SOLN
INTRAMUSCULAR | Status: AC
  Filled 2024-05-28: qty 5

## 2024-05-28 MED ORDER — DAKINS (1/4 STRENGTH) 0.125 % EX SOLN
CUTANEOUS | Status: AC
  Filled 2024-05-28: qty 473

## 2024-05-28 MED ORDER — EPHEDRINE SULFATE-NACL 50-0.9 MG/10ML-% IV SOSY
PREFILLED_SYRINGE | INTRAVENOUS | Status: DC | PRN
  Administered 2024-05-28: 10 mg via INTRAVENOUS

## 2024-05-28 MED ORDER — ONDANSETRON HCL 4 MG/2ML IJ SOLN
INTRAMUSCULAR | Status: DC | PRN
  Administered 2024-05-28: 4 mg via INTRAVENOUS

## 2024-05-28 MED ORDER — DROPERIDOL 2.5 MG/ML IJ SOLN: 0.6250 mg | Freq: Once | INTRAMUSCULAR | Status: DC | PRN

## 2024-05-28 MED ORDER — MIDAZOLAM HCL (PF) 2 MG/2ML IJ SOLN: 2.0000 mg | INTRAMUSCULAR | Status: DC | PRN

## 2024-05-28 MED ORDER — PIPERACILLIN-TAZOBACTAM 3.375 G IVPB
3.3750 g | Freq: Three times a day (TID) | INTRAVENOUS | Status: DC
  Administered 2024-05-28: 3.375 g via INTRAVENOUS
  Filled 2024-05-28: qty 50

## 2024-05-28 MED ORDER — PROPOFOL 10 MG/ML IV BOLUS
INTRAVENOUS | Status: DC | PRN
  Administered 2024-05-28: 30 mg via INTRAVENOUS
  Administered 2024-05-28: 70 mg via INTRAVENOUS

## 2024-05-28 MED ORDER — ACETAMINOPHEN 10 MG/ML IV SOLN: 1000.0000 mg | Freq: Once | INTRAVENOUS | Status: DC | PRN

## 2024-05-28 MED ORDER — OXYCODONE HCL 5 MG PO TABS: 5.0000 mg | ORAL_TABLET | Freq: Once | ORAL | Status: DC | PRN

## 2024-05-28 MED ORDER — ALBUMIN HUMAN 5 % IV SOLN: INTRAVENOUS | Status: DC | PRN

## 2024-05-28 MED ORDER — SUGAMMADEX SODIUM 200 MG/2ML IV SOLN
INTRAVENOUS | Status: AC
  Filled 2024-05-28: qty 2

## 2024-05-28 MED ORDER — LORAZEPAM 2 MG/ML IJ SOLN: 0.5000 mg | INTRAMUSCULAR | Status: DC | PRN

## 2024-05-28 MED ORDER — ONDANSETRON HCL 4 MG/2ML IJ SOLN
INTRAMUSCULAR | Status: AC
  Filled 2024-05-28: qty 2

## 2024-05-28 MED ORDER — NOREPINEPHRINE 4 MG/250ML-% IV SOLN
0.0000 ug/min | INTRAVENOUS | Status: DC
  Administered 2024-05-28: 10 ug/min via INTRAVENOUS
  Administered 2024-05-28: 2 ug/min via INTRAVENOUS
  Filled 2024-05-28 (×2): qty 250

## 2024-05-28 MED ORDER — INSULIN ASPART 100 UNIT/ML IJ SOLN: 0.0000 [IU] | INTRAMUSCULAR | Status: DC

## 2024-05-28 MED ORDER — FENTANYL CITRATE (PF) 100 MCG/2ML IJ SOLN: 25.0000 ug | INTRAMUSCULAR | Status: DC | PRN

## 2024-05-28 MED ORDER — DAKINS (1/4 STRENGTH) 0.125 % EX SOLN
CUTANEOUS | Status: DC | PRN
  Administered 2024-05-28: 1

## 2024-05-28 MED ORDER — MAGNESIUM SULFATE 2 GM/50ML IV SOLN
2.0000 g | Freq: Once | INTRAVENOUS | Status: AC
  Administered 2024-05-28: 2 g via INTRAVENOUS
  Filled 2024-05-28: qty 50

## 2024-05-28 MED ORDER — SODIUM CHLORIDE 0.9 % IV BOLUS
1000.0000 mL | Freq: Once | INTRAVENOUS | Status: AC
  Administered 2024-05-28: 1000 mL via INTRAVENOUS

## 2024-05-28 MED ORDER — MIDAZOLAM HCL (PF) 2 MG/2ML IJ SOLN
INTRAMUSCULAR | Status: DC | PRN
  Administered 2024-05-28: 1 mg via INTRAVENOUS

## 2024-05-28 MED ORDER — VANCOMYCIN HCL 1500 MG/300ML IV SOLN
1500.0000 mg | Freq: Once | INTRAVENOUS | Status: AC
  Administered 2024-05-28: 1500 mg via INTRAVENOUS
  Filled 2024-05-28: qty 300

## 2024-05-28 NOTE — Progress Notes (Signed)
 "  NAME:  Dustin [name], MRN:  [id number], DOB:  [birth year], LOS: 0 ADMISSION DATE:  05/27/2024, CONSULTATION DATE:  05/28/2024 REFERRING MD:  Leita Chancy, PA-C, CHIEF COMPLAINT:  Sepsis  History of Present Illness:  68 y/o male with h/o schizophrenia and metastatic colon cancer (sp r hemicolectomy 2023), chronic hypotension on midodrine  from [facility] with lethargy.  He has been on antibiotic at the NH for UTI.  On arrival to ED he was hypothermic and temps 90, Septic shock w/u started.  He was given 3 liters IV fluids and remains with low BP.  In the ED he was noted to have sacral wound with eschar and possible necrotizing fasciitis.  Surgery has been consulted and he has been placed on broad spectrum antibiotics.  Pertinent  Medical History  schizophrenia and metastatic colon cancer   Significant Hospital Events: Including procedures, antibiotic start and stop dates in addition to other pertinent events   12/24: admit to ICU. OR for sacral debridement.   Interim History / Subjective:  Hypothermic post op on 15 NE   Objective    Blood pressure 95/70, pulse 63, temperature (!) 95.7 F (35.4 C), resp. rate (!) 7, height 5' 7 (1.702 m), weight 62.4 kg, SpO2 97%.        Intake/Output Summary (Last 24 hours) at 05/28/2024 1047 Last data filed at 05/28/2024 1000 Gross per 24 hour  Intake 7504.43 ml  Output 250 ml  Net 7254.43 ml   Filed Weights   05/27/24 2158 05/28/24 0500  Weight: 68 kg 62.4 kg    Examination: General: chronically and acutely ill M  HENT: . Temporal muscle wasting. Poor dentition. Dry mm Lungs: even unlabored, clear  Cardiovascular: rr s1s2  Abdomen: soft ndnt  Extremities: no obvious acute joint deformity  Neuro: Awakens to stimulation, does not follow commands and falls asleep quickly   Resolved problem list  Lactic acidosis   Assessment and Plan   Acute encephalopathy superimposed on chronic ?dementia  Baseline schizophrenia -confused  at baseline. Now post op GA + with septic shock P -delirium precautions -hold his zyprexa  for now, would like him to perk up some before we restart.   -tx septic shock as below  -NPO   Septic shock:  Sacral nec fasc s/p debridement (28x20cm wound involving skin [place] tissue muscle) UTI / cystitis  Aspiration PNA  -ct also w findings of possible stercoral colitis P -post op per ccs.  -zosyn  zyvox   -follow micro data -I do not think this wound will heal unfortunately, and worry that despite best efforts he will be at high risk of recurrent infections, osteo, etc.   -addl 1L LR, cont mIVF 150/hr -- likely to have ongoing insensible wound losses and appears dry still  -weaning NE for goal SBP 90 or MAP 65   Chronic hypotension on midodrine   -holding midodrine , NPO -NE as above   Hypothermia -Bair hugger   Thrombocytopenia  -this is not zyvox  induced  -rcvd 1 plt in OR 12/24 Anemia  Coagulopathy  P -follow CBC-- on next check will also send a dic so we can see inr fibrinogen -if plt cont to drop consider changing abx   Elevated LFTs, mild -?shock liver P -AM LFTs   Severe protein calorie malnutrition (decr muscle and fat stores, poor intake)  Hypoglycemia  -q4 CBG -likely will need d5 gtt, waiting for next check  -GOC need to be delineated before escalating measures to cortrak/EN.   Infrarenal AAA w partial  thrombosis -no acute intervention  -this has progressed since prior CT and if he does discharge from the hospital should be monitored/referred for vvs pending goc   Hx colon cancer s/p R hemicolectomy  -no acute intervention   Physical deconditioning -sounds like he is largely bedfast x 1-2 years -unfortunately his deconditioning and poor mobility is a significant factor in his sacral wound, and poor healing prognosis  -not appropriate for PT/OT consult at present   GOC -worry that even if he makes some acute improvements, his overall prognosis is poor. I do not  think this wound will heal (immobile, significantly malnourished).  -plan to discuss ith brother when he arrives 12/24. Will recommend DNR/I status and explore broader GOC -palliative consult has also been placed   Labs   CBC: Recent Labs  Lab 05/27/24 2228 05/28/24 0252  WBC 11.5* 11.0*  NEUTROABS 10.5*  --   HGB 12.4* 10.2*  HCT 37.7* 31.3*  MCV 85.5 87.9  PLT 59* 55*    Basic Metabolic Panel: Recent Labs  Lab 05/27/24 2228 05/28/24 0252  NA 141 141  K 3.6 3.5  CL 105 112*  CO2 25 20*  GLUCOSE 125* 64*  BUN 29* 24*  CREATININE 1.01 0.78  CALCIUM 10.1 8.5*  MG  --  1.7  PHOS  --  2.8   GFR: Estimated Creatinine Clearance: 78 mL/min (by C-G formula based on SCr of 0.78 mg/dL). Recent Labs  Lab 05/27/24 2228 05/27/24 2258 05/28/24 0034 05/28/24 0252  WBC 11.5*  --   --  11.0*  LATICACIDVEN  --  3.0* 1.7  --     Liver Function Tests: Recent Labs  Lab 05/27/24 2228 05/28/24 0252  AST 73* 65*  ALT 52* 42  ALKPHOS 191* 154*  BILITOT 0.5 0.4  PROT 6.8 5.2*  ALBUMIN  2.6* 1.9*   No results for input(s): LIPASE, AMYLASE in the last 168 hours. No results for input(s): AMMONIA in the last 168 hours.  ABG No results found for: PHART, PCO2ART, PO2ART, HCO3, TCO2, ACIDBASEDEF, O2SAT   Coagulation Profile: Recent Labs  Lab 05/28/24 0014  INR 1.5*    Cardiac Enzymes: No results for input(s): CKTOTAL, CKMB, CKMBINDEX, TROPONINI in the last 168 hours.  HbA1C: Hemoglobin A1C  Date/Time Value Ref Range Status  10/03/2021 10:41 AM 5.0 4.0 - 5.6 % Final   HbA1c POC (<> result, manual entry)  Date/Time Value Ref Range Status  02/09/2023 09:45 AM 5.3 4.0 - 5.6 % Final   Hgb A1c MFr Bld  Date/Time Value Ref Range Status  07/13/2018 06:19 AM 5.6 4.8 - 5.6 % Final    Comment:    (NOTE) Pre diabetes:          5.7%-6.4% Diabetes:              >6.4% Glycemic control for   <7.0% adults with diabetes   08/17/2017 09:22 AM 5.4  4.8 - 5.6 % Final    Comment:    (NOTE) Pre diabetes:          5.7%-6.4% Diabetes:              >6.4% Glycemic control for   <7.0% adults with diabetes     CBG: Recent Labs  Lab 05/28/24 0738  GLUCAP 113*    CRITICAL CARE Performed by: [name] Gave   Total critical care time: 50 minutes  Critical care time was exclusive of separately billable procedures and treating other patients. Critical care was necessary to treat  or prevent imminent or life-threatening deterioration.  Critical care was time spent personally by me on the following activities: development of treatment plan with patient and/or surrogate as well as nursing, discussions with consultants, evaluation of patient's response to treatment, examination of patient, obtaining history from patient or surrogate, ordering and performing treatments and interventions, ordering and review of laboratory studies, ordering and review of radiographic studies, pulse oximetry and re-evaluation of patient's condition.  Ronnald Gave MSN, AGACNP-BC [facility] Amion for pager  05/28/2024, 10:47 AM         "

## 2024-05-28 NOTE — Progress Notes (Signed)
 ED Pharmacy Antibiotic Sign Off An antibiotic consult was received from an ED provider for vancomycin  per pharmacy dosing for sepsis. A chart review was completed to assess appropriateness.   The following one time order(s) were placed:   -Vancomycin  1500mg  IV x1  Further antibiotic and/or antibiotic pharmacy consults should be ordered by the admitting provider if indicated.   Thank you for allowing pharmacy to be a part of this patient's care.   Lynwood Poplar, [facility]  Clinical Pharmacist 05/28/2024 12:36 AM

## 2024-05-28 NOTE — IPAL (Addendum)
" °  Update: I met with Dustin [name] (legal guardian, brother) and another brother at bedside. We talked about Dustin [name]'s acute and chronic medical problems. Reviewed his ongoing outpt decline.  A decision was reached to transition to comfort care & change code status to DNR.  Discussed uncertainty of in-hospital death vs possible hospice placement, and that we can re-eval after we see what Dustin [name]'s body shows us  and what his sx burden is.  -PRN morphine   -PRN versed  -PRN atropine  drops  -can add/escalate add'l PRNs as needed  -dc labs imaging non-comfort meds  -dc aggressive wound care measures     Interdisciplinary Goals of Care Family Meeting   Date carried out: 05/28/2024  Location of the meeting: Phone conference  Member's involved: Nurse Practitioner and Family Member or next of kin  Durable Power of Attorney or acting medical decision maker: brother Dustin [name]    Discussion: We discussed goals of care for Dustin [name] . Talked about overnight events including his significant surgery & ongoing critical illness. Able to get some add'l insights re his baseline -- for 1-2 years it sounds like he has been mostly bedfast. Highly variable PO intake, mostly low/poor PO intake. Sounds like his level of interaction with others is also dependent on his medication compliance.  His baseline poor intake/malnutrition/bedfast state make me very concerned that he is not likely to heal well from this wound. I conveyed that I am worried about his long-term state, as well as possibility of acute worsening.  We agree that we need to come up with a plan for what to do should he worsen acutely, and should also discuss code status.   He will arrive 10-11 or so. RN will let me know when he arrives.    Code status:   Code Status: Full Code   Disposition: Continue current acute care  Time spent for the meeting:     Dustin FORBES Gave, NP  05/28/2024, 8:52 AM   "

## 2024-05-28 NOTE — Progress Notes (Signed)
 Pt's CBG 58 this afternoon. Per Ronnald at bedside, we're not treating since family wishes to go comfort care.

## 2024-05-28 NOTE — H&P (Signed)
 "  NAME:  Dustin [name], MRN:  [id number], DOB:  May 30, 1956, LOS: 0 ADMISSION DATE:  05/27/2024, CONSULTATION DATE:  05/28/2024 REFERRING MD:  Leita Chancy, PA-C, CHIEF COMPLAINT:  Sepsis  History of Present Illness:  68 y/o male with h/o schizophrenia and metastatic colon cancer from [facility] with lethargy.  He has been on antibiotic at the NH for UTI.  On arrival to ED he was hypothermic and temps 90, Septic shock w/u started.  He was given 3 liters IV fluids and remains with low BP.  In the ED he was noted to have sacral wound with eschar and possible necrotizing fasciitis.  Surgery has been consulted and he has been placed on broad spectrum antibiotics.  Pertinent  Medical History  schizophrenia and metastatic colon cancer   Significant Hospital Events: Including procedures, antibiotic start and stop dates in addition to other pertinent events   12/24: admit to ICU  Interim History / Subjective:  N/a  Objective    Blood pressure (!) 78/63, pulse 62, temperature (!) 91.6 F (33.1 C), resp. rate 12, height 5' 7 (1.702 m), weight 68 kg, SpO2 98%.        Intake/Output Summary (Last 24 hours) at 05/28/2024 0208 Last data filed at 05/28/2024 0130 Gross per 24 hour  Intake 3150 ml  Output --  Net 3150 ml   Filed Weights   05/27/24 2158  Weight: 68 kg    Examination: General: Awake alert NAD HENT: PERRLA no icterus, no JVD, dry mucus membranes Lungs: CTA no wheezes or rales Cardiovascular: reg s1s2 no murmurs Abdomen: soft nt nd bs pos Extremities: no edema, cyanosis, clubbing Neuro: alert oriented to person and place   Resolved problem list   Assessment and Plan  Sepsis Broad spectrum antibiotics IV fluids Vasopressors Improved LA with IV fluids Sacral decubiti with possible necrotizing fasciitis Surgical consult, await assessment Broad spectrum antibiotics Wound care UTI Broad spectrum antibiotics Urine cultures Hypothermia Warming  blanket Treatment of sepsis Schizophrenia Mood stabilizer Metastatic colin cancer Supportive care   Labs   CBC: Recent Labs  Lab 05/27/24 2228  WBC 11.5*  NEUTROABS 10.5*  HGB 12.4*  HCT 37.7*  MCV 85.5  PLT 59*    Basic Metabolic Panel: Recent Labs  Lab 05/27/24 2228  NA 141  K 3.6  CL 105  CO2 25  GLUCOSE 125*  BUN 29*  CREATININE 1.01  CALCIUM 10.1   GFR: Estimated Creatinine Clearance: 65.4 mL/min (by C-G formula based on SCr of 1.01 mg/dL). Recent Labs  Lab 05/27/24 2228 05/27/24 2258 05/28/24 0034  WBC 11.5*  --   --   LATICACIDVEN  --  3.0* 1.7    Liver Function Tests: Recent Labs  Lab 05/27/24 2228  AST 73*  ALT 52*  ALKPHOS 191*  BILITOT 0.5  PROT 6.8  ALBUMIN  2.6*   No results for input(s): LIPASE, AMYLASE in the last 168 hours. No results for input(s): AMMONIA in the last 168 hours.  ABG No results found for: PHART, PCO2ART, PO2ART, HCO3, TCO2, ACIDBASEDEF, O2SAT   Coagulation Profile: Recent Labs  Lab 05/28/24 0014  INR 1.5*    Cardiac Enzymes: No results for input(s): CKTOTAL, CKMB, CKMBINDEX, TROPONINI in the last 168 hours.  HbA1C: Hemoglobin A1C  Date/Time Value Ref Range Status  10/03/2021 10:41 AM 5.0 4.0 - 5.6 % Final   HbA1c POC (<> result, manual entry)  Date/Time Value Ref Range Status  02/09/2023 09:45 AM 5.3 4.0 - 5.6 % Final  Hgb A1c MFr Bld  Date/Time Value Ref Range Status  07/13/2018 06:19 AM 5.6 4.8 - 5.6 % Final    Comment:    (NOTE) Pre diabetes:          5.7%-6.4% Diabetes:              >6.4% Glycemic control for   <7.0% adults with diabetes   08/17/2017 09:22 AM 5.4 4.8 - 5.6 % Final    Comment:    (NOTE) Pre diabetes:          5.7%-6.4% Diabetes:              >6.4% Glycemic control for   <7.0% adults with diabetes     CBG: No results for input(s): GLUCAP in the last 168 hours.  Review of Systems:   No pain,   Past Medical History:  He,  has a  past medical history of Schizophrenia (HCC).   Surgical History:   Past Surgical History:  Procedure Laterality Date   COLON SURGERY     COLONOSCOPY N/A 10/06/2021   Procedure: COLONOSCOPY;  Surgeon: Toledo, Ladell POUR, MD;  Location: [facility] ENDOSCOPY;  Service: Gastroenterology;  Laterality: N/A;   ESOPHAGOGASTRODUODENOSCOPY N/A 10/06/2021   Procedure: ESOPHAGOGASTRODUODENOSCOPY (EGD);  Surgeon: Toledo, Ladell POUR, MD;  Location: [facility] ENDOSCOPY;  Service: Gastroenterology;  Laterality: N/A;   PARTIAL COLECTOMY  10/13/2021   Procedure: RIGHT COLECTOMY;  Surgeon: Jordis Laneta FALCON, MD;  Location: [facility] ORS;  Service: General;;     Social History:   reports that he has been smoking cigarettes. He has a 7.5 pack-year smoking history. He has never used smokeless tobacco. He reports that he does not currently use alcohol . He reports that he does not use drugs.   Family History:  His family history includes Hypertension in his brother, brother, and brother; Lung cancer in his father; Prostate cancer in his brother.   Allergies Allergies[1]   Home Medications  Prior to Admission medications  Medication Sig Start Date End Date Taking? Authorizing Provider  midodrine  (PROAMATINE ) 5 MG tablet Take 5 mg by mouth 3 (three) times daily. 10/11/23   [provider]  Nutritional Supplement LIQD Take 120 mLs by mouth every evening.    [provider]  OLANZapine  zydis (ZYPREXA ) 5 MG disintegrating tablet Take 5 mg by mouth See admin instructions. Dissolve 5 mg in the mouth at 8 AM and 8 PM 11/28/23   [provider]  paliperidone  (INVEGA  SUSTENNA) 234 MG/1.5ML injection Inject 234 mg into the muscle every 28 (twenty-eight) days. 04/05/23   Ghimire, Donalda HERO, MD  paliperidone  (INVEGA ) 6 MG 24 hr tablet Take 1 tablet (6 mg total) by mouth daily. 12/09/23   Caleen Qualia, MD     Critical care time: 23   The patient is critically ill with multiple organ system failure and requires high  complexity decision making for assessment and support, frequent evaluation and titration of therapies, advanced monitoring, review of radiographic studies and interpretation of complex data.   Critical Care Time devoted to patient care services, exclusive of separately billable procedures, described in this note is 34 minutes.   Orlin Fairly, MD [facility] See Amion for pager  If no response to pager , please call [phone] until 7pm After 7:00 pm call Elink  [phone] 05/28/2024, 2:08 AM             [1] No Known Allergies  "

## 2024-05-28 NOTE — Anesthesia Procedure Notes (Signed)
 Procedure Name: Intubation Date/Time: 05/28/2024 3:11 AM  Performed by: Roddie Grate, CRNAPre-anesthesia Checklist: Patient identified, Emergency Drugs available, Suction available, Patient being monitored and Timeout performed Patient Re-evaluated:Patient Re-evaluated prior to induction Oxygen Delivery Method: Circle system utilized Preoxygenation: Pre-oxygenation with 100% oxygen Induction Type: IV induction, Rapid sequence and Cricoid Pressure applied Laryngoscope Size: Mac and 4 Grade View: Grade I Tube type: Oral Tube size: 7.5 mm Number of attempts: 1 Airway Equipment and Method: Stylet Placement Confirmation: ETT inserted through vocal cords under direct vision, positive ETCO2 and breath sounds checked- equal and bilateral Secured at: 22 cm Tube secured with: Tape Dental Injury: Teeth and Oropharynx as per pre-operative assessment  Comments: Smooth IV Induction. Eyes taped. RSI Performed. DL x 1 with grade 1 view. Atraumatically placed, teeth and lip remain intact as pre-op. Secured with tape. Bilateral breath sounds +/=, EtCO2 +, Adequate TV.

## 2024-05-28 NOTE — Consult Note (Signed)
 " Palliative Medicine Inpatient Consult Note  Consulting Provider: Dr. Paola  Reason for consult:   Palliative Care Consult Services Palliative Medicine Consult  Reason for Consult? goc    05/28/2024  HPI:  Per intake H&P --> 68 y/o male with h/o schizophrenia and metastatic colon cancer (sp r hemicolectomy 2023), chronic hypotension on midodrine  from [facility] with lethargy.  He has been on antibiotic at the NH for UTI.  The palliative care team has been asked to support additional goals of care conversation in the setting of severe septic shock.  Clinical Assessment/Goals of Care:  *Please note that this is a verbal dictation therefore any spelling or grammatical errors are due to the Dragon Medical One system interpretation.  I have reviewed medical records including EPIC notes, labs and imaging, received report from bedside RN, assessed the patient who is lying in bed does open his eyes but is not verbally responsive.    I spoke to patient's brother, Nicholette to further discuss diagnosis prognosis, GOC, EOL wishes, disposition and options.   I introduced Palliative Medicine as specialized medical care for people living with serious illness. It focuses on providing relief from the symptoms and stress of a serious illness. The goal is to improve quality of life for both the patient and the family.  Medical History Review and Understanding:  A review of Dustin [name]'s past medical history significant for schizophrenia, sinus bradycardia, metastatic colon cancer, & tobacco abuse was completed.  Social History:  Dustin [name] is from [place], [place] .  Dustin [name] has never been married or had children.  He had worked earlier in his life though since being identified with schizoaffective disorder has not worked.  He is a man of faith practicing within the [employer] denomination.  Functional and Nutritional State:  Dustin [name] lived in a single-family home with with his friend who would help care  for him given his declining physical abilities and predominance in bed.  He is required help with most B ADLs.  His nutritional intake  Advance Directives:  A detailed discussion was had today regarding advanced directives.  Patient does not have identified paperwork though his surrogate decision maker is in has been his brother, Dustin [name] Ramnauth.  Code Status:  Concepts specific to code status, artifical feeding and hydration, continued IV antibiotics and rehospitalization was had.  The difference between a aggressive medical intervention path  and a palliative comfort care path for this patient at this time was had.   Dustin [name] is a DO NOT RESUSCITATE/DO NOT INTUBATE CODE STATUS.  Discussion:  I spoke to [facility] surrounding the circumstances associated with admission.  We reviewed Dustin [name]'s declining health state in the setting of recurrent rehospitalization's.  We discussed his general frailty associated with his current conditions (metastatic colon cancer) and the difficulties over the years associated with his mental health (schizophrenia).   We discussed the reasons for hospitalization inclusive of patient having a large sacral wound with concern for necrotizing fasciitis requiring irrigation and debridement by Dr. Ann.  We discussed since this was done patient had not improved rather worsened. Dustin [name] and I reviewed that he met with the critical care team earlier and has elected to proceed with making his brother comfortable.  We talked about transition to comfort measures in house and what that would entail inclusive of medications to control pain, dyspnea, agitation, nausea, itching, and hiccups.  We discussed stopping all uneccessary measures such as cardiac monitoring, blood draws, needle sticks, and frequent vital signs.   We discussed  if for any reason Dustin [name] should stabilize the thought of inpatient hospice.  Patient's brother shared that if this were the case he would want his  brother to transition to [facility] hospice home.  I shared we will allow the next 24 hours to assess Dustin [name] and if he remains stable we will work towards transition to inpatient hospice.  Utilized reflective listening throughout our time on the telephone together given the gravity of the situation.  Discussed the importance of continued conversation with family and their  medical providers regarding overall plan of care and treatment options, ensuring decisions are within the context of the patients values and GOCs.  Decision Maker: Dustin [name],Dustin [name] Brother, Emergency Contact [phone] (Mobile)   SUMMARY OF RECOMMENDATIONS   DNAR/DNI  Comfort care measures instituted in the ICU  Medication geared toward comfort per [facility]  Will add morphine  2 mg IV push every 6 hours around-the-clock for sacral pain  Appreciate the transitions of care team reaching out to [facility] for inpatient hospice evaluation tomorrow should patient stabilize  Ongoing palliative care support  Code Status/Advance Care Planning: DNAR/DNI  Palliative Prophylaxis:  Aspiration, Bowel Regimen, Delirium Protocol, Frequent Pain Assessment, Oral Care, Palliative Wound Care, and Turn Reposition  Additional Recommendations (Limitations, Scope, Preferences): Continue current care  Psycho-social/Spiritual:  Desire for further Chaplaincy support: Yes Additional Recommendations: Education on EOL expectations   Prognosis: Limited overall.   Discharge Planning: Discharge will either be [facility] or to inpatient hospice home in [place].   Vitals:   05/28/24 1030 05/28/24 1045  BP: 101/69 95/70  Pulse: 65 63  Resp:    Temp: (!) 95.5 F (35.3 C) (!) 95.7 F (35.4 C)  SpO2: 98% 97%    Intake/Output Summary (Last 24 hours) at 05/28/2024 1453 Last data filed at 05/28/2024 1000 Gross per 24 hour  Intake 7504.43 ml  Output 250 ml  Net 7254.43 ml   Last Weight  Most recent update: 05/28/2024  6:47 AM     Weight  62.4 kg (137 lb 9.1 oz)            LABS: CBC:    Component Value Date/Time   WBC 11.0 (H) 05/28/2024 0252   HGB 10.2 (L) 05/28/2024 0252   HGB 13.0 02/09/2023 1006   HCT 31.3 (L) 05/28/2024 0252   HCT 40.2 02/09/2023 1006   PLT 55 (L) 05/28/2024 0252   PLT 103 (L) 02/09/2023 1006   MCV 87.9 05/28/2024 0252   MCV 86 02/09/2023 1006   MCV 83 05/14/2013 1123   NEUTROABS 10.5 (H) 05/27/2024 2228   NEUTROABS 5.0 10/03/2021 1127   NEUTROABS 6.3 05/14/2013 1123   LYMPHSABS 0.5 (L) 05/27/2024 2228   LYMPHSABS 0.9 10/03/2021 1127   LYMPHSABS 2.0 05/14/2013 1123   MONOABS 0.3 05/27/2024 2228   MONOABS 0.8 05/14/2013 1123   EOSABS 0.0 05/27/2024 2228   EOSABS 0.0 10/03/2021 1127   EOSABS 0.1 05/14/2013 1123   BASOSABS 0.1 05/27/2024 2228   BASOSABS 0.0 10/03/2021 1127   BASOSABS 0.0 05/14/2013 1123   Comprehensive Metabolic Panel:    Component Value Date/Time   NA 141 05/28/2024 0252   NA 139 02/09/2023 1006   NA 139 05/14/2013 1123   K 3.5 05/28/2024 0252   K 4.0 05/14/2013 1123   CL 112 (H) 05/28/2024 0252   CL 111 (H) 05/14/2013 1123   CO2 20 (L) 05/28/2024 0252   CO2 23 05/14/2013 1123   BUN 24 (H) 05/28/2024 0252   BUN 14 02/09/2023 1006  BUN 10 05/14/2013 1123   CREATININE 0.78 05/28/2024 0252   CREATININE 1.46 (H) 05/14/2013 1123   GLUCOSE 64 (L) 05/28/2024 0252   GLUCOSE 87 05/14/2013 1123   CALCIUM 8.5 (L) 05/28/2024 0252   CALCIUM 9.3 05/14/2013 1123   AST 65 (H) 05/28/2024 0252   AST 16 05/14/2013 1123   ALT 42 05/28/2024 0252   ALT 18 05/14/2013 1123   ALKPHOS 154 (H) 05/28/2024 0252   ALKPHOS 102 05/14/2013 1123   BILITOT 0.4 05/28/2024 0252   BILITOT 0.2 02/09/2023 1006   BILITOT 0.4 05/14/2013 1123   PROT 5.2 (L) 05/28/2024 0252   PROT 6.6 02/09/2023 1006   PROT 7.4 05/14/2013 1123   ALBUMIN  1.9 (L) 05/28/2024 0252   ALBUMIN  3.7 (L) 02/09/2023 1006   ALBUMIN  3.7 05/14/2013 1123   Gen:   Elderly African-American male chronically ill  in appearance HEENT: Dry mucous membranes CV: Regular rate and rhythm  PULM: On room air breathing is even and nonlabored ABD: soft/nontender  EXT: No edema  Neuro: Somnolent opens eyes  PPS: 10%   This conversation/these recommendations were discussed with patient primary care team, Ronnald Gave ______________________________________________________ [facility] [place] Palliative Medicine Team Team Cell Phone: [phone] Please utilize secure chat with additional questions, if there is no response within 30 minutes please call the above phone number  Billing based on MDM: High  Palliative Medicine Team providers are available by phone from 7am to 7pm daily and can be reached through the team cell phone.  Should this patient require assistance outside of these hours, please call the patient's attending physician.  "

## 2024-05-28 NOTE — Anesthesia Postprocedure Evaluation (Signed)
"   Anesthesia Post Note  Patient: Dustin [name]  Procedure(s) Performed: IRRIGATION AND DEBRIDEMENT OF NECROTIZING SOFT TISSUE INFECTION (Coccyx)     Patient location during evaluation: PACU Anesthesia Type: General Level of consciousness: patient cooperative Pain management: pain level controlled Vital Signs Assessment: post-procedure vital signs reviewed and stable Respiratory status: spontaneous breathing, nonlabored ventilation, respiratory function stable and patient connected to nasal cannula oxygen Cardiovascular status: blood pressure returned to baseline and stable Postop Assessment: no apparent nausea or vomiting Anesthetic complications: no Comments: Remains hypothermic   No notable events documented.                 Mailin Coglianese      "

## 2024-05-28 NOTE — Op Note (Signed)
 IRRIGATION AND DEBRIDEMENT OF NECROTIZING SOFT TISSUE INFECTION  Operative Note (CSN: [id number])  Service  Date of Surgery: 05/28/2024 Admit Date: 05/27/2024 Performing Service: General Surgeons and Role:    [name], Richerd, MD - Primary  Op Note Pre-op Diagnosis: necrotizing soft tissue infection Post-op Diagnosis: same  Procedures: IRRIGATION AND DEBRIDEMENT OF NECROTIZING SOFT TISSUE INFECTION  Findings: Several pockets of dishwater fluid and purulent pockets with foul odor. Extensive nonviable tissue including skin, subcutaneous tissue and muscle was debrided. Wound measured 28x20cm  Anesthesia: General Estimated Blood Loss: 50cc  Complications: None  Specimens:  ID Type Source Tests Collected by Time Destination  A : Sacral Wound Wound Wound AEROBIC/ANAEROBIC CULTURE W GRAM STAIN (SURGICAL/DEEP WOUND) Silversmith Richerd, MD 05/28/2024 [phone]      Procedure Details  Prior to the procedure, the risks, benefits, complications, treatment options, and expected outcomes were discussed with the patient and/or family, including but not limited to, the risks of bleeding, infection, post-op seroma, post-op hematoma, wound dehiscence, and recurrence.[name]  Despite the risks, brother of patient, Dustin [name], has given informed consent for operative intervention.  The patient was taken to the Operating Room, identified as Dustin [name] and the procedure verified as irrigation and debridement of necrotizing soft tissue infection. Identification pause was held and the above information confirmed.  General endotracheal anesthesia was induced. The patient was then placed in prone position and buttocks, sacrum and perienum was prepped with betadine and draped in the typical sterile fashion.  A formal preincision time out was performed.  Using electrocautery, all nonviable skin, subcutaneous and muscle was debrided back to healthy bleeding tissue. Wound measured 28x20cm. A large piece of  infected tissue was sent for culture. Would then irrigated with sterile saline. Hemostasis achieved with electrocautery. Some minimal oozing despite extensive electrocautery so platelets ordered by anesthesia.  Wound then packed with wet to dry dressing using Dakins soaked kerlix then dry kerlix and mesh panties.   Instrument, sponge, and needle counts were correct at the conclusion of the case.   [name], MD [facility] Surgery Date: 05/28/2024  Time: 4:03 AM

## 2024-05-28 NOTE — Anesthesia Preprocedure Evaluation (Signed)
 "                                  Anesthesia Evaluation    Reviewed: Patient's Chart, lab work & pertinent test results, Unable to perform ROS - Chart review onlyPreop documentation limited or incomplete due to emergent nature of procedure.  Airway        Dental   Pulmonary Current Smoker          Cardiovascular       ECHOCARDIOGRAM REPORT       Patient Name:   Dustin [name] Date of Exam: 11/13/2022 Medical Rec #:  [id number]         Height:       67.0 in Accession #:    [id number]        Weight:       151.5 lb Date of Birth:  29-Jan-1956         BSA:          1.797 m Patient Age:    68 years          BP:           101/63 mmHg Patient Gender: M                 HR:           49 bpm. Exam Location:  [facility]  Procedure: 2D Echo, Cardiac Doppler and Color Doppler  Indications:    Bacteremia R78.81   History:        Patient has no prior history of Echocardiogram examinations.                 Signs/Symptoms:Syncope; Risk Factors:Current Smoker. Hx of                 Schizoaffective disorder, bipolar type (HCC), COVID-19 virus                 infection, Adenocarcinoma of colon (HCC).   Sonographer:    Aida Pizza RCS Referring Phys: [phone] CARLOS MADERA  IMPRESSIONS    1. Left ventricular ejection fraction, by estimation, is 55 to 60%. The left ventricle has normal function. The left ventricle has no regional wall motion abnormalities. Left ventricular diastolic parameters were normal.  2. Right ventricular systolic function is normal. The right ventricular size is normal. There is normal pulmonary artery systolic pressure.  3. Left atrial size was moderately dilated.  4. Right atrial size was moderately dilated.  5. The mitral valve is normal in structure. No evidence of mitral valve regurgitation. No evidence of mitral stenosis.  6. The tricuspid valve is abnormal.  7. The aortic valve is tricuspid. Aortic valve regurgitation is not visualized. No  aortic stenosis is present.  8. The inferior vena cava is dilated in size with >50% respiratory variability, suggesting right atrial pressure of 8 mmHg.     Neuro/Psych  PSYCHIATRIC DISORDERS   Bipolar Disorder Schizophrenia     GI/Hepatic   Endo/Other    Renal/GU Lab Results      Component                Value               Date                      NA  141                 05/27/2024                K                        3.6                 05/27/2024                CO2                      25                  05/27/2024                GLUCOSE                  125 (H)             05/27/2024                BUN                      29 (H)              05/27/2024                CREATININE               1.01                05/27/2024                CALCIUM                  10.1                05/27/2024                EGFR                     84                  02/09/2023                GFRNONAA                 >60                 05/27/2024                Musculoskeletal   Abdominal   Peds  Hematology  (+) Blood dyscrasia, anemia Lab Results      Component                Value               Date                      WBC                      11.5 (H)            05/27/2024                HGB                      12.4 (L)  05/27/2024                HCT                      37.7 (L)            05/27/2024                MCV                      85.5                05/27/2024                PLT                      59 (L)              05/27/2024              Anesthesia Other Findings Hypotensive and hypothermic in ed  Reproductive/Obstetrics                              Anesthesia Physical Anesthesia Plan  ASA: 5 and emergent  Anesthesia Plan: General   Post-op Pain Management:    Induction: Intravenous, Rapid sequence and Cricoid pressure planned  PONV Risk Score and Plan: 2 and Ondansetron  and  Dexamethasone   Airway Management Planned: Oral ETT  Additional Equipment:   Intra-op Plan:   Post-operative Plan: Possible Post-op intubation/ventilation  Informed Consent:      History available from chart only and Only emergency history available  Plan Discussed with: CRNA  Anesthesia Plan Comments:          Anesthesia Quick Evaluation  "

## 2024-05-28 NOTE — Transfer of Care (Addendum)
 Immediate Anesthesia Transfer of Care Note  Patient: Dustin [name]  Procedure(s) Performed: IRRIGATION AND DEBRIDEMENT OF NECROTIZING SOFT TISSUE INFECTION (Coccyx)  Patient Location: PACU  Anesthesia Type:General  Level of Consciousness: drowsy  Airway & Oxygen Therapy: Patient Spontanous Breathing and Patient connected to nasal cannula oxygen  Post-op Assessment: Report given to RN, Post -op Vital signs reviewed and stable, and Patient remains hypothermic and on Levo gtt  Post vital signs: Reviewed  Last Vitals:  Vitals Value Taken Time  BP 93/69 05/28/24 04:45  Temp 34.1 C 05/28/24 04:50  Pulse 72 05/28/24 04:50  Resp 10 05/28/24 04:50  SpO2 96 % 05/28/24 04:50  Vitals shown include unfiled device data.  Last Pain:  Vitals:   05/28/24 0230  TempSrc: Bladder         Complications: No notable events documented.

## 2024-05-28 NOTE — Progress Notes (Signed)
 Pharmacy Electrolyte Replacement  Recent Labs:  Recent Labs    05/28/24 0252  K 3.5  MG 1.7  PHOS 2.8  CREATININE 0.78    Low Critical Values (K </= 2.5, Phos </= 1, Mg </= 1) Present: None  MD Contacted: n/a  Plan: KCl 10mEq IV x6 via PIV. Mag 2g IV x1. Repeat labs in AM.   Harlene Boga, PharmD Please refer to [facility] for [facility] numbers 05/28/2024, 8:38 AM

## 2024-05-28 NOTE — Progress Notes (Signed)
 eLink Physician-Brief Progress Note Patient Name: Dustin [name] DOB: 30-Aug-1955 MRN: [id number]   Date of Service  05/28/2024  HPI/Events of Note  68 year old male with a history of metastatic colon cancer and schizophrenia who presents to the emergency department with septic shock secondary to a necrotizing skin infection associated with his sacral decubitus ulceration.  He underwent surgical debridement and returns to the ICU for further management  Vitals, labs, and imaging reviewed.  Requiring norepinephrine  infusion at 15 mcg running through peripheral line.  Has a history of hypoglycemic episodes  eICU Interventions  Add vasopressin , maintain norepinephrine  and crystalloid infusion.  May need to add dextrose  containing fluid  Referred to ground team for CVC placement  Broad-spectrum antibiotics with linezolid  and Zosyn , operative cultures pending  DVT prophylaxis with SCDs GI prophylaxis not indicated     Intervention Category Evaluation Type: New Patient Evaluation  Collin Rengel 05/28/2024, 6:12 AM

## 2024-05-28 NOTE — Consult Note (Signed)
 "   Reason for Consult:  Necrotizing soft tissue infection Referring Provider: Haze, MD  HPI  Dustin [name] is an 68 y.o. male who presented to ED via EMS from [facility] due to concern for lethargy. Baseline mental status is oriented to person. He has history of UTI for which he is on daily IM abx. He was hypothermic on arrival to ED with rectal temp of 90. Sepsis protocol initiated and placed under bair hugger.   He has history of schizophrenia and metastatic colon cancer.  Patient unable to provide much history. CT scan was concerning for patchy soft tissue gas consistent with necrotizing fasciitis and gas forming infection. On physical exam there is large eschar with no obvious open areas to explain the air beyond being a necrotizing infection. However I do not feel any crepitus--limited due to thickness of eschar. I debrided some eschar at bedside and there was putrid foul smelling infected tissue below. Patient was hemodynamically unstable in ED requiring initiation of pressors due to SBP in 70s-80s.  10 point review of systems is negative except as listed above in HPI.  Objective  Past Medical History: Past Medical History:  Diagnosis Date   Schizophrenia [facility])     Past Surgical History: Past Surgical History:  Procedure Laterality Date   COLON SURGERY     COLONOSCOPY N/A 10/06/2021   Procedure: COLONOSCOPY;  Surgeon: Toledo, Ladell POUR, MD;  Location: [facility] ENDOSCOPY;  Service: Gastroenterology;  Laterality: N/A;   ESOPHAGOGASTRODUODENOSCOPY N/A 10/06/2021   Procedure: ESOPHAGOGASTRODUODENOSCOPY (EGD);  Surgeon: Toledo, Ladell POUR, MD;  Location: [facility] ENDOSCOPY;  Service: Gastroenterology;  Laterality: N/A;   PARTIAL COLECTOMY  10/13/2021   Procedure: RIGHT COLECTOMY;  Surgeon: Jordis Laneta FALCON, MD;  Location: [facility] ORS;  Service: General;;    Family History:  Family History  Problem Relation Age of Onset   Lung cancer Father    Hypertension Brother     Hypertension Brother    Hypertension Brother    Prostate cancer Brother     Social History:  reports that he has been smoking cigarettes. He has a 7.5 pack-year smoking history. He has never used smokeless tobacco. He reports that he does not currently use alcohol . He reports that he does not use drugs.  Allergies: Allergies[1]  Medications: I have reviewed the patient's current medications.  Labs: I have personally reviewed all labs for the past 24h  Imaging: I have personally reviewed and interpreted all imaging for the past 24h and agree with the radiologist's impression.  CT CHEST ABDOMEN PELVIS W CONTRAST Result Date: 05/28/2024 EXAM: CT CHEST, ABDOMEN AND PELVIS WITH CONTRAST 05/27/2024 11:54:00 PM TECHNIQUE: CT of the chest, abdomen and pelvis was performed with the administration of intravenous contrast. Multiplanar reformatted images are provided for review. Automated exposure control, iterative reconstruction, and/or weight based adjustment of the mA/kV was utilized to reduce the radiation dose to as low as reasonably achievable. COMPARISON: Portable chest today, portable chest 12/04/2023, and chest abdomen and pelvis CTs with contrast dated 07/20/2022 and 10/09/2021. CLINICAL HISTORY: Sepsis with a questionable left infrahilar infiltrate on the chest x-ray from today. FINDINGS: CHEST: MEDIASTINUM AND LYMPH NODES: The heart is slightly enlarged. There are left main and patchy moderate 3-vessel coronary calcifications. There is mild aortic tortuosity and atherosclerosis without aneurysm, stenosis, or dissection. The great vessels are clear. The esophagus is patulous which may be seen in chronic dysmotility, but has a normal wall thickness. There is a tiny hiatal hernia. There is debris  in the left main bronchus. The trachea and right main bronchus are clear. No mediastinal, hilar or axillary lymphadenopathy. LUNGS AND PLEURA: The lungs are moderately emphysematous with centrilobular changes  predominating. There is patchy consolidation in the posteromedial basal and superior segments of the left lower lobe with scattered subsegmental bronchial impactions. Findings consistent with pneumonia with possible aspiration. The lungs are otherwise clear. No pleural effusion or pneumothorax. ABDOMEN AND PELVIS: LIVER: The liver is mildly steatotic without mass. GALLBLADDER AND BILE DUCTS: Gallbladder is unremarkable. No biliary ductal dilatation. SPLEEN: No acute abnormality. PANCREAS: No acute abnormality. ADRENAL GLANDS: The left adrenal gland is unremarkable. There is a new heterogeneous right adrenal nodule measuring 2.0 x 1.4 cm best seen on series 11 axial 18. Chemical shift MRI is recommended, without and with contrast. KIDNEYS, URETERS AND BLADDER: No intrarenal stones are noted bilaterally. On the right there is no hydroureteronephrosis, but there is a 3 mm stone in the distal right ureter 3 cm proximal to the UVJ. There are no other ureteral stones. There is thickening in the bladder with perivesical stranding worrisome for cystitis. Correlate with urinalysis. The wall thickening could have a hypertrophic component due to enlargement of the adjacent prostate. GI AND BOWEL: The stomach is unremarkable, partially contracted. Small bowel normal caliber. There is an old right hemicolectomy. There is increased rectal fecal retention, rectal wall thickening, and perirectal stranding consistent with stercoral colitis. REPRODUCTIVE ORGANS: The prostate is mildly enlarged. Both testicles are retracted into the inguinal canals. PERITONEUM AND RETROPERITONEUM: No ascites. No free air. VASCULATURE: There is moderate to heavy aortic and branch vessel atherosclerosis. Infrarenal abdominal aortic aneurysm is again noted with partial thrombosis, today measuring 4.1 x 3.7 cm on series 2 axial image 83, was previously 3.9 x 3.4 cm. ABDOMINAL AND PELVIS LYMPH NODES: No lymphadenopathy. BONES AND SOFT TISSUES: There is  degenerative change, osteopenia, and mild dextroscoliosis of the thoracic spine. The rib cage is intact. There is osteopenia with degenerative changes in the lumbar spine and hip joints, relatively mild. Mild dendritic gynecomastia with no significant chest wall findings. Who There is interval new demonstration of patchy soft tissue gas overlying the dorsal distal sacrum and coccyx, findings consistent with necrotizing fasciitis and gas forming infection with a likely impending sacral decubitus ulcer. There are no overt destructive changes in the sacrum and coccyx. Numerous calcified injection granulomas in both buttocks. . No other acute soft tissue abnormality. IMPRESSION: 1. Patchy soft tissue gas overlying the dorsal distal sacrum and coccyx, consistent with necrotizing fasciitis and gas forming infection with a likely impending sacral decubitus ulcer, without overt destructive changes in the underlying sacrum and coccyx. 2. Patchy consolidation in the posteromedial basal and superior segments of the left lower lobe with scattered subsegmental bronchial impactions, consistent with pneumonia with possible aspiration. 3. Bladder wall thickening with perivesical stranding, worrisome for cystitis. 4. Increased rectal fecal retention with rectal wall thickening and perirectal stranding, consistent with stercoral colitis. 5. Infrarenal abdominal aortic aneurysm with partial thrombosis measuring 4.1 x 3.7 cm, increased from 3.9 x 3.4 cm, with recommendation for CT or MRI follow-up in 12 months and to establish or continue care with a vascular specialist. 6. New heterogeneous right adrenal nodule measuring 2.0 x 1.4 cm, with recommendation for chemical shift MRI without and with contrast. 7. Moderate pulmonary emphysema, which is an independent risk factor for lung cancer, with recommendation to consider evaluation for a low-dose CT lung cancer screening program. 8. 3 mm stone in the distal  right ureter approximately 3  cm proximal to the UVJ. 9. Cardiomegaly, aortic and coronary atherosclerosis, and additional findings discussed above. . 10. Discussed in detail over the phone with Dr. Leita Chancy at 12:31 am, 05/28/2024. Electronically signed by: Francis Quam MD 05/28/2024 12:40 AM EST RP Workstation: [id number]   DG Chest Port 1 View Result Date: 05/27/2024 CLINICAL DATA:  Possible sepsis EXAM: PORTABLE CHEST 1 VIEW COMPARISON:  12/04/2023, CT 07/20/2022 FINDINGS: Emphysema and chronic bronchitic changes. Streaky atelectasis versus mild infiltrate in the left infrahilar lung. Stable cardiomediastinal silhouette with aortic atherosclerosis. No pneumothorax IMPRESSION: Emphysema and chronic bronchitic changes. Streaky atelectasis versus mild infiltrate in the left infrahilar lung. Electronically Signed   By: Luke Bun M.D.   On: 05/27/2024 22:49     Physical Exam Blood pressure 93/63, pulse 72, temperature (!) 92.6 F (33.7 C), resp. rate 12, height 5' 7 (1.702 m), weight 68 kg, SpO2 100%. General: somnolent HEENT: normocephalic, atraumatic Oropharynx: mucous membranes dry CV: Regular rate and rhythm, hypotensive Chest: equal chest rise bilaterally normal respiratory effort on room air Abdomen: soft and nontender Back: Large eschar associated with decubitus ulcer with concern. Tenderness with deep palpation. Insensate over eschar--able to sharply debride some eschar revealing foul/putrid smelling infected tissue below eschar. Extremities: moves all extremities Skin: warm, dry, no rashes Neuro: Altered, not oriented    Assessment   Dustin [name] is an 68 y.o. male with infected sacral decubitus ulcer with concerns for necrotizing infection  Plan  - OR emergently for irrigation and debridement of sacrum - Consent obtained from brother via telephone at [phone]. Discussed probability of large wound and concerns for healing issues given he already had developed sacral ulcer from being in his  facility. Discussed may need multiple takebacks to OR. Risk of bleeding, risk of residual infection. Brother agreed to proceed.  I reviewed ED provider notes, hospitalist notes, last 24 h vitals and pain scores, last 48 h intake and output, last 24 h labs and trends, and last 24 h imaging results.  This care required moderate level of medical decision making.    [name], MD General Surgery, Surgical Critical Care and Trauma       [1] No Known Allergies  "

## 2024-05-29 ENCOUNTER — Encounter (HOSPITAL_COMMUNITY): Payer: Self-pay | Admitting: General Surgery

## 2024-05-29 DIAGNOSIS — R6521 Severe sepsis with septic shock: Secondary | ICD-10-CM

## 2024-05-29 DIAGNOSIS — A419 Sepsis, unspecified organism: Secondary | ICD-10-CM

## 2024-05-29 LAB — PREPARE PLATELET PHERESIS: Unit division: 0

## 2024-05-29 LAB — BPAM PLATELET PHERESIS
Blood Product Expiration Date: 202512252359
ISSUE DATE / TIME: 202512240356
Unit Type and Rh: 5100

## 2024-05-29 MED ORDER — GLYCOPYRROLATE 0.2 MG/ML IJ SOLN: 0.2000 mg | INTRAMUSCULAR | Status: DC | PRN

## 2024-05-29 NOTE — Progress Notes (Signed)
 Dustin [name] [phone] [facility] hospital liaison note   Referral received from [facility] for family interest in [facility].   Spoke with pts brother [name] by phone to explain services and hospice philosophy and all questions answered.  Hospice home is able to accept patient this afternoon once consents are complete.    RN staff, you may call report at any time to [phone], room is assigned when report is called.  Please leave IV intact and send completed DNR with patient.   Updated attending and [facility] manager via [employer].  Thank you for the opportunity to participate in this patient's care  Amy Darien BSN, RN [facility] Liaison [phone]

## 2024-05-29 NOTE — TOC Transition Note (Signed)
 Transition of Care [facility]) - Discharge Note   Patient Details  Name: Dustin [name] MRN: [id number] Date of Birth: 11-Oct-1955  Transition of Care [facility]) CM/SW Contact:  Inocente GORMAN Kindle, LCSW Phone Number: 05/29/2024, 12:34 PM   Clinical Narrative:    Patient will DC to: [facility] Facility  Anticipated DC date: 05/29/24 Family notified: Brother Transport by: [facility]   Per MD patient ready for DC to Hospice. RN to call report prior to discharge [phone]). RN, patient, patient's family, and facility notified of DC. Discharge Summary sent to facility. DC packet on chart including signed DNR. Ambulance transport requested for patient.   CSW will sign off for now as social work intervention is no longer needed. Please consult us  again if new needs arise.     Final next level of care: Hospice Medical Facility Barriers to Discharge: Barriers Resolved   Patient Goals and CMS Choice Patient states their goals for this hospitalization and ongoing recovery are:: Comfort CMS Medicare.gov Compare Post Acute Care list provided to:: Patient Represenative (must comment)        Discharge Placement              Patient chooses bed at:  ([facility]) Patient to be transferred to facility by: [facility] Name of family member notified: brother Patient and family notified of of transfer: 05/29/24  Discharge Plan and Services Additional resources added to the After Visit Summary for   In-house Referral: Clinical Social Work, Hospice / Palliative Care   Post Acute Care Choice: Hospice                               Social Drivers of Health (SDOH) Interventions SDOH Screenings   Food Insecurity: Patient Unable To Answer (12/04/2023)  Housing: Unknown (12/04/2023)  Transportation Needs: Patient Unable To Answer (12/04/2023)  Utilities: Patient Unable To Answer (12/04/2023)  Alcohol  Screen: Low Risk (03/06/2022)  Depression (PHQ2-9): Low Risk (02/09/2023)  Social Connections:  Unknown (05/29/2024)  Tobacco Use: High Risk (05/28/2024)     Readmission Risk Interventions    12/05/2023    2:21 PM 03/02/2023    1:24 PM  Readmission Risk Prevention Plan  Transportation Screening Complete Complete  PCP or Specialist Appt within 5-7 Days Complete   PCP or Specialist Appt within 3-5 Days  Complete  Home Care Screening Complete   Medication Review (RN CM) Complete   Palliative Care Screening  Not Applicable  Medication Review (RN Care Manager)  Complete

## 2024-05-29 NOTE — Progress Notes (Signed)
 "                                                                                                                                                                                                         Daily Progress Note   Patient Name: Dustin [name]       Date: 05/29/2024 DOB: January 22, 1956  Age: 68 y.o. MRN#: [id number] Attending Physician: Mannam, Praveen, MD Primary Care Physician: Vicci Barnie NOVAK, MD Admit Date: 05/27/2024  Reason for Consultation/Follow-up: Non pain symptom management, Pain control, Psychosocial/spiritual support, and Terminal Care  Subjective: I have reviewed medical records including: EPIC notes: PMT, nursing, CCM, surgery.  Noted patient transition to full comfort care on 12/24. MAR: Comfort medication orders per [facility].  Receiving scheduled every 6 hours IV morphine .  As needed medications administered in the last 24 hours morphine  x 1, mouth rinse Available advanced directives: in ACP - None. In shadow chart - none. Durable DNR form completed and placed in shadow chart. Copy was made and will be scanned into Vynca/ACP tab. Noted per EHR patient has legal guardian/[name] Stangler.  Labs: Creatinine 0.78 assessed for opioid prescribing and prognostication, low albumin  1.9 assessed for overall health, nutritional status, and disease severity, helping predict prognosis and guide care  Went to visit patient at bedside -no family/visitors present.  Patient was lying in bed asleep-did not attempt to wake to preserve comfort. No signs or non-verbal gestures of pain or discomfort noted. No respiratory distress, increased work of breathing, or secretions noted.   Patient appears stable for hospice transfer; however, window for low risk transfer is likely closing soon.  Per previous discussions family were interested in [facility].   Length of Stay: 1  Current Medications: Scheduled Meds:    morphine  injection  2 mg Intravenous Q6H    Continuous  Infusions:   PRN Meds: acetaminophen  **OR** acetaminophen , artificial tears, atropine , haloperidol  lactate, LORazepam , morphine  injection, ondansetron  (ZOFRAN ) IV, mouth rinse, polyethylene glycol  Physical Exam Vitals and nursing note reviewed.  Constitutional:      General: He is not in acute distress.    Appearance: He is ill-appearing.  Pulmonary:     Effort: No respiratory distress.  Skin:    General: Skin is warm and dry.  Neurological:     Comments: asleep             Vital Signs: BP (!) 82/63   Pulse (!) 47   Temp (!) 94.8 F (34.9 C)   Resp 11   Ht 5' 7 (1.702 m)  Wt 62.4 kg   SpO2 97%   BMI 21.55 kg/m  SpO2: SpO2: 97 % O2 Device: O2 Device: Room Air O2 Flow Rate:    Intake/output summary:  Intake/Output Summary (Last 24 hours) at 05/29/2024 9386 Last data filed at 05/29/2024 0327 Gross per 24 hour  Intake 1824.75 ml  Output 730 ml  Net 1094.75 ml   LBM: Last BM Date : 05/28/24 Baseline Weight: Weight: 68 kg Most recent weight: Weight: 62.4 kg       Palliative Assessment/Data: PPS 10%      Patient Active Problem List   Diagnosis Date Noted   Septic shock (HCC) 05/28/2024   Acute UTI (urinary tract infection) 12/04/2023   Hypokalemia 12/04/2023   Mild protein malnutrition 12/04/2023   Urinary tract infection without hematuria 02/27/2023   Metabolic encephalopathy 02/27/2023   Sinus bradycardia 02/26/2023   Hypothermia 02/26/2023   Altered mental status 02/26/2023   Aggressive behavior 02/26/2023   History of colon cancer, stage I 02/09/2023   Bacteremia 11/12/2022   Sepsis secondary to UTI (HCC) 11/10/2022   Pressure injury of skin 11/10/2022   Protein-calorie malnutrition, severe 07/22/2022   COVID-19 virus infection 07/20/2022   Hypotension 07/20/2022   Aspiration pneumonia (HCC) 07/20/2022   Adenocarcinoma of colon (HCC) 12/13/2021   Malignant neoplasm of ascending colon (HCC) 11/08/2021   Family history of cancer 11/08/2021    IDA (iron deficiency anemia)    Colonic mass 10/06/2021   Microcytic anemia 10/05/2021   Syncope 10/05/2021   Symptomatic anemia    Psychosis (HCC) 07/12/2018   Schizoaffective disorder, bipolar type (HCC) 01/24/2017   Tobacco use disorder 11/29/2016    Palliative Care Assessment & Plan   Patient Profile: Per intake H&P --> 68 y/o male with h/o schizophrenia and metastatic colon cancer (sp r hemicolectomy 2023), chronic hypotension on midodrine  from [facility] with lethargy.  He has been on antibiotic at the NH for UTI.  The palliative care team has been asked to support additional goals of care conversation in the setting of severe septic shock.   Assessment: Principal Problem:   Septic shock (HCC)   Terminal care  Recommendations/Plan: Continue full comfort measures Continue DNR/DNI as previously documented Patient appears stable for hospice transfer today though window for low risk transfer is likely closing soon Continue current comfort focused medication regimen as noted below - no changes Continue palliative wound care Added orders to reflect full comfort measures, as well as discontinued orders that were not focused on comfort PMT will continue to follow and support holistically  Symptom Management Scheduled morphine  IV every 6 hours; continue as needed doses for breakthrough pain/dyspnea/increased work of breathing/RR>25 Tylenol  PRN pain/fever Atropine  as needed for excessive secretions Haldol  PRN agitation/delirium Ativan  PRN anxiety/seizure/sleep/distress Zofran  PRN nausea/vomiting Liquifilm Tears PRN dry eye    Goals of Care and Additional Recommendations: Limitations on Scope of Treatment: Full Comfort Care  Code Status:    Code Status Orders  (From admission, onward)           Start     Ordered   05/28/24 1223  Do not attempt resuscitation (DNR) - Comfort care  Continuous       Question Answer Comment  If patient has no pulse and is not  breathing Do Not Attempt Resuscitation   In Pre-Arrest Conditions (Patient Is Breathing and Has a Pulse) Provide comfort measures. Relieve any mechanical airway obstruction. Avoid transfer unless required for comfort.   Consent: Discussion documented in EHR or advanced directives  reviewed      05/28/24 1223           Code Status History     Date Active Date Inactive Code Status Order ID Comments User Context   05/28/2024 0209 05/28/2024 1223 Full Code [id number]  Ilah Corean CHRISTELLA DEVONNA ED   12/04/2023 1450 12/09/2023 2026 Full Code [id number]  Celinda Alm Lot, MD ED   02/26/2023 2241 03/02/2023 1938 Full Code [id number]  Laveda Roosevelt, MD ED   11/10/2022 1434 11/23/2022 1943 Full Code [id number]  Ricky Fines, MD Inpatient   07/20/2022 2104 07/26/2022 2314 Full Code [id number]  Verdene Candido POUR, MD ED   12/29/2021 2010 12/30/2021 0428 Full Code [id number]  Viviann Pastor, MD ED   10/05/2021 1419 10/18/2021 1842 Full Code [id number]  Hilma Rankins, MD ED   07/12/2018 2040 07/16/2018 1714 Full Code [id number]  [name], Norleen DASEN, MD Inpatient   09/03/2017 1225 09/19/2017 1838 Full Code [id number]  Mal Catherene NOVAK, MD Inpatient   08/16/2017 2115 08/29/2017 1548 Full Code [id number]  Kandyce Norleen DASEN, MD Inpatient   01/11/2017 2344 01/25/2017 1447 Full Code [id number]  [name], Norleen DASEN, MD Inpatient   11/30/2016 0140 12/13/2016 1752 Full Code [id number]  Mal Catherene NOVAK, MD Inpatient       Prognosis:  Poor  Discharge Planning: Hospice facility  Care plan was discussed with primary RN, TOC, Dr. Cosette, hospice liaison   Thank you for allowing the Palliative Medicine Team to assist in the care of this patient.   Billing based on MDM: High  Problems Addressed: One acute or chronic illness or injury that poses a threat to life or bodily function  Amount and/or Complexity of Data: Category 1:Review of prior external note(s) from each unique source, Review of the result(s) of each unique test, and  Assessment requiring an independent historian(s) and Category 3:Discussion of management or test interpretation with external physician/other qualified health care professional/appropriate source (not separately reported)  Risks: Parenteral controlled substances    Lady Wisham CHRISTELLA Sharps, NP  Please contact Palliative Medicine Team phone at [phone] for questions and concerns.   *Portions of this note are a verbal dictation therefore any spelling and/or grammatical errors are due to the Dragon Medical One system interpretation.     "

## 2024-05-29 NOTE — Plan of Care (Signed)
  Problem: Clinical Measurements: Goal: Respiratory complications will improve Outcome: Progressing Goal: Cardiovascular complication will be avoided Outcome: Progressing   Problem: Elimination: Goal: Will not experience complications related to urinary retention Outcome: Progressing   Problem: Skin Integrity: Goal: Risk for impaired skin integrity will decrease Outcome: Progressing

## 2024-05-29 NOTE — Discharge Summary (Addendum)
 " [facility] Phys[facility] Discharge Summary   Patient name: Dustin [name]  Admit date:     05/27/2024  Discharge date: 05/29/2024  Attending Physician: MAREE HARDER [[id number]]  Discharge Physician: Norval Bar   PCP: Vicci Barnie NOVAK, MD  Admitted From: Home  Disposition:  Residential Hospice  Recommendations for Outpatient Follow-up:  Follow up for comfort care only  Home Health:No Equipment/Devices: @ECDMELIST @  Discharge Condition:Hospice CODE STATUS:Comfort Care Diet recommendation: Regular Fluid Restriction: None  Hospital Summary:  68 y/o male with h/o schizophrenia and metastatic colon cancer (sp r hemicolectomy 2023), chronic hypotension on midodrine  from [facility] with lethargy.  He has been on antibiotic at the NH for UTI.  On arrival to ED he was hypothermic and temps 90, Septic shock w/u started.  He was given 3 liters IV fluids and remains with low BP.  In the ED he was noted to have sacral wound with eschar and possible necrotizing fasciitis.  Surgery was consulted and he was placed on broad spectrum antibiotics. Underwent debridement. Started on vasopressors. After discussion with family with assistance from palliative care team, patient/family opted for comfort care only approach. Transitioned to comfort care only at this time. Plan for discharge to hospice care.   Discharge Diagnoses:  Principal Problem:   Septic shock [facility])   Discharge Instructions   Allergies[1]  Discharge Exam: Vitals:   05/29/24 0400 05/29/24 0800  BP:    Pulse: (!) 41 (!) 45  Resp: 10 (!) 8  Temp: (!) 94.8 F (34.9 C) (!) 94.6 F (34.8 C)  SpO2: 96% 97%    Physical Exam Vitals and nursing note reviewed.  Constitutional:      General: He is not in acute distress.    Appearance: He is ill-appearing.     Comments: Somnolent, weak, frail, cachectic  HENT:     Head: Normocephalic and atraumatic.  Cardiovascular:     Rate and Rhythm: Normal rate and regular  rhythm.     Pulses: Normal pulses.     Heart sounds: Normal heart sounds.  Pulmonary:     Effort: Pulmonary effort is normal.     Breath sounds: Normal breath sounds.  Abdominal:     General: Bowel sounds are normal.     Palpations: Abdomen is soft.     The results of significant diagnostics from this hospitalization (including imaging, microbiology, ancillary and laboratory) are listed below for reference.    Microbiology: Recent Results (from the past 240 hours)  Urine Culture     Status: Abnormal (Preliminary result)   Collection Time: 05/27/24 10:28 PM   Specimen: Urine, Random  Result Value Ref Range Status   Specimen Description URINE, RANDOM  Final   Special Requests NONE Reflexed from [id number]  Final   Culture (A)  Final    >=100,000 COLONIES/mL GRAM NEGATIVE RODS SUSCEPTIBILITIES TO FOLLOW Performed at [facility], [address] [place], [place] [address]    Report Status PENDING  Incomplete  Resp panel by RT-PCR (RSV, Flu A&B, Covid)     Status: None   Collection Time: 05/27/24 10:29 PM   Specimen: Nasal Swab  Result Value Ref Range Status   SARS Coronavirus 2 by RT PCR NEGATIVE NEGATIVE Final   Influenza A by PCR NEGATIVE NEGATIVE Final   Influenza B by PCR NEGATIVE NEGATIVE Final    Comment: (NOTE) The Xpert Xpress SARS-CoV-2/FLU/RSV plus assay is intended as an aid in the diagnosis of influenza from Nasopharyngeal swab specimens and should  not be used as a sole basis for treatment. Nasal washings and aspirates are unacceptable for Xpert Xpress SARS-CoV-2/FLU/RSV testing.  Fact Sheet for Patients: bloggercourse.com  Fact Sheet for Healthcare Providers: seriousbroker.it  This test is not yet approved or cleared by the [place]  FDA and has been authorized for detection and/or diagnosis of SARS-CoV-2 by FDA under an Emergency Use Authorization (EUA). This EUA will remain in effect (meaning this  test can be used) for the duration of the COVID-19 declaration under Section 564(b)(1) of the Act, 21 U.S.C. section 360bbb-3(b)(1), unless the authorization is terminated or revoked.     Resp Syncytial Virus by PCR NEGATIVE NEGATIVE Final    Comment: (NOTE) Fact Sheet for Patients: bloggercourse.com  Fact Sheet for Healthcare Providers: seriousbroker.it  This test is not yet approved or cleared by the [place]  FDA and has been authorized for detection and/or diagnosis of SARS-CoV-2 by FDA under an Emergency Use Authorization (EUA). This EUA will remain in effect (meaning this test can be used) for the duration of the COVID-19 declaration under Section 564(b)(1) of the Act, 21 U.S.C. section 360bbb-3(b)(1), unless the authorization is terminated or revoked.  Performed at [facility], [address] [place], [place] [address]   Blood Culture (routine x 2)     Status: None (Preliminary result)   Collection Time: 05/27/24 10:30 PM   Specimen: BLOOD  Result Value Ref Range Status   Specimen Description BLOOD RIGHT ANTECUBITAL  Final   Special Requests   Final    BOTTLES DRAWN AEROBIC AND ANAEROBIC Blood Culture results may not be optimal due to an inadequate volume of blood received in culture bottles   Culture   Final    NO GROWTH < 12 HOURS Performed at [facility], [address] [place], [place] [address]    Report Status PENDING  Incomplete  Blood Culture (routine x 2)     Status: None (Preliminary result)   Collection Time: 05/27/24 10:35 PM   Specimen: BLOOD  Result Value Ref Range Status   Specimen Description BLOOD LEFT ANTECUBITAL  Final   Special Requests   Final    BOTTLES DRAWN AEROBIC AND ANAEROBIC Blood Culture adequate volume   Culture   Final    NO GROWTH < 12 HOURS Performed at [facility], [address] [place], [place] [address]    Report Status PENDING  Incomplete   Aerobic/Anaerobic Culture w Gram Stain (surgical/deep wound)     Status: None (Preliminary result)   Collection Time: 05/28/24  3:24 AM   Specimen: Wound  Result Value Ref Range Status   Specimen Description WOUND  Final   Special Requests SACRAL WOUND SPEC A  Final   Gram Stain NO WBC SEEN NO ORGANISMS SEEN   Final   Culture   Final    FEW MORGANELLA MORGANII CULTURE REINCUBATED FOR BETTER GROWTH SUSCEPTIBILITIES TO FOLLOW Performed at [facility], [address], [place], [place] [address]    Report Status PENDING  Incomplete  MRSA Next Gen by PCR, Nasal     Status: Abnormal   Collection Time: 05/28/24  6:04 AM  Result Value Ref Range Status   MRSA by PCR Next Gen DETECTED (A) NOT DETECTED Final    Comment: RESULT CALLED TO, READ BACK BY AND VERIFIED WITH: RN R. MEZA [phone] AT 0840, ADC (NOTE) The GeneXpert MRSA Assay (FDA approved for NASAL specimens only), is one component of a comprehensive MRSA colonization  surveillance program. It is not intended to diagnose MRSA infection nor to guide or monitor treatment for MRSA infections. Test performance is not FDA approved in patients less than 21 years old. Performed at [facility], [address] [place], [place] [address]      Labs: ProBNP, BNP (last 5 results) No results for input(s): PROBNP, BNP in the last 8760 hours. Basic Metabolic Panel: Recent Labs  Lab 05/27/24 2228 05/28/24 0252  NA 141 141  K 3.6 3.5  CL 105 112*  CO2 25 20*  GLUCOSE 125* 64*  BUN 29* 24*  CREATININE 1.01 0.78  CALCIUM 10.1 8.5*  MG  --  1.7  PHOS  --  2.8   Liver Function Tests: Recent Labs  Lab 05/27/24 2228 05/28/24 0252  AST 73* 65*  ALT 52* 42  ALKPHOS 191* 154*  BILITOT 0.5 0.4  PROT 6.8 5.2*  ALBUMIN  2.6* 1.9*   No results for input(s): LIPASE, AMYLASE in the last 168 hours. No results for input(s): AMMONIA in the last 168 hours. CBC: Recent Labs  Lab 05/27/24 2228 05/28/24 0252  WBC  11.5* 11.0*  NEUTROABS 10.5*  --   HGB 12.4* 10.2*  HCT 37.7* 31.3*  MCV 85.5 87.9  PLT 59* 55*   Cardiac Enzymes: No results for input(s): CKTOTAL, CKMB, CKMBINDEX, TROPONINI, TROPONINIHS in the last 168 hours. BNP: No results for input(s): BNP in the last 168 hours. CBG: Recent Labs  Lab 05/28/24 0738 05/28/24 1158  GLUCAP 113* 58*   D-Dimer No results for input(s): DDIMER in the last 72 hours. Hgb A1c No results for input(s): HGBA1C in the last 72 hours. Lipid Profile No results for input(s): CHOL, HDL, LDLCALC, TRIG, CHOLHDL, LDLDIRECT in the last 72 hours. Thyroid  function studies No results for input(s): TSH, T4TOTAL, FREET4, T3FREE, THYROIDAB in the last 72 hours.  Invalid input(s): FREET3 Anemia work up No results for input(s): VITAMINB12, FOLATE, FERRITIN, TIBC, IRON, RETICCTPCT in the last 72 hours. Urinalysis    Component Value Date/Time   COLORURINE YELLOW 05/27/2024 2228   APPEARANCEUR CLOUDY (A) 05/27/2024 2228   APPEARANCEUR Hazy 05/14/2013 1123   LABSPEC 1.017 05/27/2024 2228   LABSPEC 1.025 05/14/2013 1123   PHURINE 5.0 05/27/2024 2228   GLUCOSEU NEGATIVE 05/27/2024 2228   GLUCOSEU Negative 05/14/2013 1123   HGBUR MODERATE (A) 05/27/2024 2228   BILIRUBINUR NEGATIVE 05/27/2024 2228   BILIRUBINUR small 10/03/2021 1026   BILIRUBINUR Negative 05/14/2013 1123   KETONESUR NEGATIVE 05/27/2024 2228   PROTEINUR 30 (A) 05/27/2024 2228   UROBILINOGEN negative (A) 10/03/2021 1026   NITRITE POSITIVE (A) 05/27/2024 2228   LEUKOCYTESUR LARGE (A) 05/27/2024 2228   LEUKOCYTESUR Negative 05/14/2013 1123   Sepsis Labs Recent Labs  Lab 05/27/24 2228 05/28/24 0252  WBC 11.5* 11.0*    Procedures/Studies: ECHOCARDIOGRAM COMPLETE Result Date: 05/28/2024    ECHOCARDIOGRAM REPORT   Patient Name:   ABDIRAHMAN CHITTUM Mullinix Date of Exam: 05/28/2024 Medical Rec #:  [id number]         Height:       67.0 in Accession #:     [id number]        Weight:       137.6 lb Date of Birth:  1956/05/25         BSA:          1.725 m Patient Age:    68 years          BP:  107/70 mmHg Patient Gender: M                 HR:           56 bpm. Exam Location:  Inpatient Procedure: 2D Echo, Cardiac Doppler and Color Doppler (Both Spectral and Color            Flow Doppler were utilized during procedure). Indications:    Shock R57.9  History:        Patient has prior history of Echocardiogram examinations, most                 recent 11/13/2022.  Sonographer:    Sydnee Wilson RDCS Referring Phys: [phone] VICTORIA BURTON IMPRESSIONS  1. Left ventricular ejection fraction, by estimation, is 60 to 65%. The left ventricle has normal function. The left ventricle has no regional wall motion abnormalities. Left ventricular diastolic parameters are consistent with Grade II diastolic dysfunction (pseudonormalization). There is the interventricular septum is flattened in systole, consistent with right ventricular pressure overload.  2. Right ventricular systolic function is normal. The right ventricular size is moderately enlarged. There is severely elevated pulmonary artery systolic pressure. The estimated right ventricular systolic pressure is 83.2 mmHg.  3. Right atrial size was severely dilated.  4. The mitral valve is normal in structure. Trivial mitral valve regurgitation. No evidence of mitral stenosis.  5. Tricuspid valve regurgitation is severe.  6. The aortic valve is normal in structure. Aortic valve regurgitation is not visualized. No aortic stenosis is present.  7. The inferior vena cava is dilated in size with <50% respiratory variability, suggesting right atrial pressure of 15 mmHg. FINDINGS  Left Ventricle: Left ventricular ejection fraction, by estimation, is 60 to 65%. The left ventricle has normal function. The left ventricle has no regional wall motion abnormalities. The left ventricular internal cavity size was normal in size. There is   no left ventricular hypertrophy. The interventricular septum is flattened in systole, consistent with right ventricular pressure overload. Left ventricular diastolic parameters are consistent with Grade II diastolic dysfunction (pseudonormalization). Right Ventricle: The right ventricular size is moderately enlarged. No increase in right ventricular wall thickness. Right ventricular systolic function is normal. There is severely elevated pulmonary artery systolic pressure. The tricuspid regurgitant velocity is 4.13 m/s, and with an assumed right atrial pressure of 15 mmHg, the estimated right ventricular systolic pressure is 83.2 mmHg. Left Atrium: Left atrial size was normal in size. Right Atrium: Right atrial size was severely dilated. Pericardium: There is no evidence of pericardial effusion. Mitral Valve: The mitral valve is normal in structure. Trivial mitral valve regurgitation. No evidence of mitral valve stenosis. Tricuspid Valve: The tricuspid valve is normal in structure. Tricuspid valve regurgitation is severe. No evidence of tricuspid stenosis. Aortic Valve: The aortic valve is normal in structure. Aortic valve regurgitation is not visualized. No aortic stenosis is present. Pulmonic Valve: The pulmonic valve was normal in structure. Pulmonic valve regurgitation is trivial. No evidence of pulmonic stenosis. Aorta: The aortic root is normal in size and structure. Venous: The inferior vena cava is dilated in size with less than 50% respiratory variability, suggesting right atrial pressure of 15 mmHg. IAS/Shunts: No atrial level shunt detected by color flow Doppler.  LEFT VENTRICLE PLAX 2D LVIDd:         4.30 cm      Diastology LVIDs:         2.70 cm      LV e' medial:    13.90 cm/s  LV PW:         0.90 cm      LV E/e' medial:  5.7 LV IVS:        1.00 cm      LV e' lateral:   7.22 cm/s LVOT diam:     2.00 cm      LV E/e' lateral: 11.0 LV SV:         71 LV SV Index:   41 LVOT Area:     3.14 cm LV IVRT:        137 msec  LV Volumes (MOD) LV vol d, MOD A2C: 105.0 ml LV vol d, MOD A4C: 104.0 ml LV vol s, MOD A2C: 36.7 ml LV vol s, MOD A4C: 32.9 ml LV SV MOD A2C:     68.3 ml LV SV MOD A4C:     104.0 ml LV SV MOD BP:      72.2 ml RIGHT VENTRICLE RV S prime:     13.90 cm/s TAPSE (M-mode): 2.3 cm LEFT ATRIUM           Index LA diam:      3.00 cm 1.74 cm/m LA Vol (A2C): 23.6 ml 13.68 ml/m LA Vol (A4C): 22.0 ml 12.75 ml/m  AORTIC VALVE LVOT Vmax:   92.20 cm/s LVOT Vmean:  61.700 cm/s LVOT VTI:    0.227 m  AORTA Ao Root diam: 3.30 cm Ao Asc diam:  3.10 cm MITRAL VALVE               TRICUSPID VALVE MV Area (PHT): 4.80 cm    TR Peak grad:   68.2 mmHg MV Decel Time: 158 msec    TR Vmax:        413.00 cm/s MV E velocity: 79.40 cm/s MV A velocity: 52.20 cm/s  SHUNTS MV E/A ratio:  1.52        Systemic VTI:  0.23 m                            Systemic Diam: 2.00 cm Toribio Fuel MD Electronically signed by Toribio Fuel MD Signature Date/Time: 05/28/2024/4:12:44 PM    Final    US  EKG SITE RITE Result Date: 05/28/2024 If Site Rite image not attached, placement could not be confirmed due to current cardiac rhythm.  CT CHEST ABDOMEN PELVIS W CONTRAST Result Date: 05/28/2024 EXAM: CT CHEST, ABDOMEN AND PELVIS WITH CONTRAST 05/27/2024 11:54:00 PM TECHNIQUE: CT of the chest, abdomen and pelvis was performed with the administration of intravenous contrast. Multiplanar reformatted images are provided for review. Automated exposure control, iterative reconstruction, and/or weight based adjustment of the mA/kV was utilized to reduce the radiation dose to as low as reasonably achievable. COMPARISON: Portable chest today, portable chest 12/04/2023, and chest abdomen and pelvis CTs with contrast dated 07/20/2022 and 10/09/2021. CLINICAL HISTORY: Sepsis with a questionable left infrahilar infiltrate on the chest x-ray from today. FINDINGS: CHEST: MEDIASTINUM AND LYMPH NODES: The heart is slightly enlarged. There are left main and  patchy moderate 3-vessel coronary calcifications. There is mild aortic tortuosity and atherosclerosis without aneurysm, stenosis, or dissection. The great vessels are clear. The esophagus is patulous which may be seen in chronic dysmotility, but has a normal wall thickness. There is a tiny hiatal hernia. There is debris in the left main bronchus. The trachea and right main bronchus are clear. No mediastinal, hilar or axillary lymphadenopathy. LUNGS AND PLEURA: The lungs are moderately emphysematous with centrilobular  changes predominating. There is patchy consolidation in the posteromedial basal and superior segments of the left lower lobe with scattered subsegmental bronchial impactions. Findings consistent with pneumonia with possible aspiration. The lungs are otherwise clear. No pleural effusion or pneumothorax. ABDOMEN AND PELVIS: LIVER: The liver is mildly steatotic without mass. GALLBLADDER AND BILE DUCTS: Gallbladder is unremarkable. No biliary ductal dilatation. SPLEEN: No acute abnormality. PANCREAS: No acute abnormality. ADRENAL GLANDS: The left adrenal gland is unremarkable. There is a new heterogeneous right adrenal nodule measuring 2.0 x 1.4 cm best seen on series 11 axial 18. Chemical shift MRI is recommended, without and with contrast. KIDNEYS, URETERS AND BLADDER: No intrarenal stones are noted bilaterally. On the right there is no hydroureteronephrosis, but there is a 3 mm stone in the distal right ureter 3 cm proximal to the UVJ. There are no other ureteral stones. There is thickening in the bladder with perivesical stranding worrisome for cystitis. Correlate with urinalysis. The wall thickening could have a hypertrophic component due to enlargement of the adjacent prostate. GI AND BOWEL: The stomach is unremarkable, partially contracted. Small bowel normal caliber. There is an old right hemicolectomy. There is increased rectal fecal retention, rectal wall thickening, and perirectal stranding  consistent with stercoral colitis. REPRODUCTIVE ORGANS: The prostate is mildly enlarged. Both testicles are retracted into the inguinal canals. PERITONEUM AND RETROPERITONEUM: No ascites. No free air. VASCULATURE: There is moderate to heavy aortic and branch vessel atherosclerosis. Infrarenal abdominal aortic aneurysm is again noted with partial thrombosis, today measuring 4.1 x 3.7 cm on series 2 axial image 83, was previously 3.9 x 3.4 cm. ABDOMINAL AND PELVIS LYMPH NODES: No lymphadenopathy. BONES AND SOFT TISSUES: There is degenerative change, osteopenia, and mild dextroscoliosis of the thoracic spine. The rib cage is intact. There is osteopenia with degenerative changes in the lumbar spine and hip joints, relatively mild. Mild dendritic gynecomastia with no significant chest wall findings. Who There is interval new demonstration of patchy soft tissue gas overlying the dorsal distal sacrum and coccyx, findings consistent with necrotizing fasciitis and gas forming infection with a likely impending sacral decubitus ulcer. There are no overt destructive changes in the sacrum and coccyx. Numerous calcified injection granulomas in both buttocks. . No other acute soft tissue abnormality. IMPRESSION: 1. Patchy soft tissue gas overlying the dorsal distal sacrum and coccyx, consistent with necrotizing fasciitis and gas forming infection with a likely impending sacral decubitus ulcer, without overt destructive changes in the underlying sacrum and coccyx. 2. Patchy consolidation in the posteromedial basal and superior segments of the left lower lobe with scattered subsegmental bronchial impactions, consistent with pneumonia with possible aspiration. 3. Bladder wall thickening with perivesical stranding, worrisome for cystitis. 4. Increased rectal fecal retention with rectal wall thickening and perirectal stranding, consistent with stercoral colitis. 5. Infrarenal abdominal aortic aneurysm with partial thrombosis measuring  4.1 x 3.7 cm, increased from 3.9 x 3.4 cm, with recommendation for CT or MRI follow-up in 12 months and to establish or continue care with a vascular specialist. 6. New heterogeneous right adrenal nodule measuring 2.0 x 1.4 cm, with recommendation for chemical shift MRI without and with contrast. 7. Moderate pulmonary emphysema, which is an independent risk factor for lung cancer, with recommendation to consider evaluation for a low-dose CT lung cancer screening program. 8. 3 mm stone in the distal right ureter approximately 3 cm proximal to the UVJ. 9. Cardiomegaly, aortic and coronary atherosclerosis, and additional findings discussed above. . 10. Discussed in detail over the phone with  Dr. Leita Chancy at 12:31 am, 05/28/2024. Electronically signed by: Francis Quam MD 05/28/2024 12:40 AM EST RP Workstation: [id number]   DG Chest Port 1 View Result Date: 05/27/2024 CLINICAL DATA:  Possible sepsis EXAM: PORTABLE CHEST 1 VIEW COMPARISON:  12/04/2023, CT 07/20/2022 FINDINGS: Emphysema and chronic bronchitic changes. Streaky atelectasis versus mild infiltrate in the left infrahilar lung. Stable cardiomediastinal silhouette with aortic atherosclerosis. No pneumothorax IMPRESSION: Emphysema and chronic bronchitic changes. Streaky atelectasis versus mild infiltrate in the left infrahilar lung. Electronically Signed   By: Luke Bun M.D.   On: 05/27/2024 22:49    Time coordinating discharge: 35 mins  SIGNED:  Norval Bar, MD [facility] 05/29/2024, 11:38 AM     [1] No Known Allergies  "

## 2024-05-29 NOTE — TOC Initial Note (Signed)
 Transition of Care [facility]) - Initial/Assessment Note    Patient Details  Name: Dustin [name] MRN: [id number] Date of Birth: [birth year]  Transition of Care [facility]) CM/SW Contact:    Inocente GORMAN Kindle, LCSW Phone Number: 05/29/2024, 9:10 AM  Clinical Narrative:                 Patient admitted from [facility]. CSW received consult for hospice facility placement. Patient's siblings are requesting [facility]. Referral sent for review.    Expected Discharge Plan: Hospice Medical Facility Barriers to Discharge: Hospice Bed not available   Patient Goals and CMS Choice Patient states their goals for this hospitalization and ongoing recovery are:: Comfort          Expected Discharge Plan and Services In-house Referral: Clinical Social Work, Hospice / Palliative Care                                            Prior Living Arrangements/Services     Patient language and need for interpreter reviewed:: Yes Do you feel safe going back to the place where you live?: Yes      Need for Family Participation in Patient Care: Yes (Comment) Care giver support system in place?: Yes (comment)   Criminal Activity/Legal Involvement Pertinent to Current Situation/Hospitalization: No - Comment as needed  Activities of Daily Living      Permission Sought/Granted Permission sought to share information with : Facility Medical Sales Representative, Family Supports Permission granted to share information with : Yes, Verbal Permission Granted              Emotional Assessment Appearance:: Appears stated age Attitude/Demeanor/Rapport: Unable to Assess Affect (typically observed): Unable to Assess Orientation: : Oriented to Self Alcohol  / Substance Use: Not Applicable Psych Involvement: No (comment)  Admission diagnosis:  Necrotizing fasciitis (HCC) [M72.6] Colitis [K52.9] Hypothermia, initial encounter [T68.XXXA] Septic shock (HCC) [A41.9, R65.21] Urinary tract infection in  male [N39.0] Aspiration pneumonia of lower lobe, unspecified aspiration pneumonia type, unspecified laterality (HCC) [J69.0] Sepsis, due to unspecified organism, unspecified whether acute organ dysfunction present [facility]) [A41.9] Patient Active Problem List   Diagnosis Date Noted   Septic shock (HCC) 05/28/2024   Acute UTI (urinary tract infection) 12/04/2023   Hypokalemia 12/04/2023   Mild protein malnutrition 12/04/2023   Urinary tract infection without hematuria 02/27/2023   Metabolic encephalopathy 02/27/2023   Sinus bradycardia 02/26/2023   Hypothermia 02/26/2023   Altered mental status 02/26/2023   Aggressive behavior 02/26/2023   History of colon cancer, stage I 02/09/2023   Bacteremia 11/12/2022   Sepsis secondary to UTI (HCC) 11/10/2022   Pressure injury of skin 11/10/2022   Protein-calorie malnutrition, severe 07/22/2022   COVID-19 virus infection 07/20/2022   Hypotension 07/20/2022   Aspiration pneumonia (HCC) 07/20/2022   Adenocarcinoma of colon (HCC) 12/13/2021   Malignant neoplasm of ascending colon (HCC) 11/08/2021   Family history of cancer 11/08/2021   IDA (iron deficiency anemia)    Colonic mass 10/06/2021   Microcytic anemia 10/05/2021   Syncope 10/05/2021   Symptomatic anemia    Psychosis (HCC) 07/12/2018   Schizoaffective disorder, bipolar type (HCC) 01/24/2017   Tobacco use disorder 11/29/2016   PCP:  Vicci Barnie NOVAK, MD Pharmacy:  No Pharmacies Listed    Social Drivers of Health (SDOH) Social History: SDOH Screenings   Food Insecurity: Patient Unable To Answer (12/04/2023)  Housing: Unknown (  12/04/2023)  Transportation Needs: Patient Unable To Answer (12/04/2023)  Utilities: Patient Unable To Answer (12/04/2023)  Alcohol  Screen: Low Risk (03/06/2022)  Depression (PHQ2-9): Low Risk (02/09/2023)  Social Connections: Patient Unable To Answer (12/04/2023)  Tobacco Use: High Risk (05/28/2024)   SDOH Interventions:     Readmission Risk Interventions     12/05/2023    2:21 PM 03/02/2023    1:24 PM  Readmission Risk Prevention Plan  Transportation Screening Complete Complete  PCP or Specialist Appt within 5-7 Days Complete   PCP or Specialist Appt within 3-5 Days  Complete  Home Care Screening Complete   Medication Review (RN CM) Complete   Palliative Care Screening  Not Applicable  Medication Review (RN Care Manager)  Complete

## 2024-05-29 NOTE — Progress Notes (Signed)
 Noted transition to comfort care. Continue dressing changes prn for patient comfort. Surgery to sign off.   Dreama GEANNIE Hanger, MD General and Trauma Surgery [facility] Surgery

## 2024-05-30 LAB — URINE CULTURE: Culture: >=100000 — AB

## 2024-05-30 LAB — LEGIONELLA PNEUMOPHILA SEROGP 1 UR AG: L. pneumophila Serogp 1 Ur Ag: NEGATIVE

## 2024-05-31 LAB — AEROBIC/ANAEROBIC CULTURE W GRAM STAIN (SURGICAL/DEEP WOUND): Gram Stain: NONE SEEN

## 2024-06-01 LAB — CULTURE, BLOOD (ROUTINE X 2)
Culture: NO GROWTH
Culture: NO GROWTH
Special Requests: ADEQUATE

## 2024-07-06 DEATH — deceased
